# Patient Record
Sex: Female | Born: 1935 | Race: White | Hispanic: No | Smoking: Never smoker
Health system: Southern US, Community
[De-identification: ages and names within clinical notes are randomized; demographics above are authoritative.]

## PROBLEM LIST (undated history)

## (undated) DIAGNOSIS — R351 Nocturia: Secondary | ICD-10-CM

## (undated) DIAGNOSIS — M1990 Unspecified osteoarthritis, unspecified site: Secondary | ICD-10-CM

## (undated) DIAGNOSIS — T4145XA Adverse effect of unspecified anesthetic, initial encounter: Secondary | ICD-10-CM

## (undated) DIAGNOSIS — C50912 Malignant neoplasm of unspecified site of left female breast: Secondary | ICD-10-CM

## (undated) DIAGNOSIS — S40812A Abrasion of left upper arm, initial encounter: Secondary | ICD-10-CM

## (undated) DIAGNOSIS — H269 Unspecified cataract: Secondary | ICD-10-CM

## (undated) DIAGNOSIS — R296 Repeated falls: Secondary | ICD-10-CM

## (undated) DIAGNOSIS — N189 Chronic kidney disease, unspecified: Secondary | ICD-10-CM

## (undated) DIAGNOSIS — T8859XA Other complications of anesthesia, initial encounter: Secondary | ICD-10-CM

## (undated) DIAGNOSIS — E785 Hyperlipidemia, unspecified: Secondary | ICD-10-CM

## (undated) DIAGNOSIS — J302 Other seasonal allergic rhinitis: Secondary | ICD-10-CM

## (undated) DIAGNOSIS — C4490 Unspecified malignant neoplasm of skin, unspecified: Secondary | ICD-10-CM

## (undated) DIAGNOSIS — K219 Gastro-esophageal reflux disease without esophagitis: Secondary | ICD-10-CM

## (undated) DIAGNOSIS — D649 Anemia, unspecified: Secondary | ICD-10-CM

## (undated) DIAGNOSIS — F99 Mental disorder, not otherwise specified: Secondary | ICD-10-CM

## (undated) DIAGNOSIS — I10 Essential (primary) hypertension: Secondary | ICD-10-CM

## (undated) DIAGNOSIS — R112 Nausea with vomiting, unspecified: Secondary | ICD-10-CM

## (undated) DIAGNOSIS — Z9889 Other specified postprocedural states: Secondary | ICD-10-CM

## (undated) DIAGNOSIS — K7460 Unspecified cirrhosis of liver: Secondary | ICD-10-CM

## (undated) HISTORY — PX: SHOULDER SURGERY: SHX246

## (undated) HISTORY — PX: ROTATOR CUFF REPAIR: SHX139

## (undated) HISTORY — PX: SKIN BIOPSY: SHX1

## (undated) HISTORY — PX: BREAST SURGERY: SHX581

## (undated) HISTORY — DX: Malignant neoplasm of unspecified site of left female breast: C50.912

## (undated) HISTORY — PX: FRACTURE SURGERY: SHX138

## (undated) HISTORY — PX: EYE SURGERY: SHX253

## (undated) HISTORY — PX: BACK SURGERY: SHX140

## (undated) HISTORY — PX: CHOLECYSTECTOMY: SHX55

## (undated) HISTORY — DX: Hyperlipidemia, unspecified: E78.5

## (undated) HISTORY — DX: Unspecified cirrhosis of liver: K74.60

## (undated) HISTORY — PX: TUBAL LIGATION: SHX77

## (undated) HISTORY — PX: ABDOMINAL HYSTERECTOMY: SHX81

---

## 1998-09-13 ENCOUNTER — Ambulatory Visit (HOSPITAL_COMMUNITY): Admission: RE | Admit: 1998-09-13 | Discharge: 1998-09-13 | Payer: Self-pay | Admitting: Urology

## 1998-09-13 ENCOUNTER — Encounter: Payer: Self-pay | Admitting: Urology

## 1999-11-09 ENCOUNTER — Encounter: Admission: RE | Admit: 1999-11-09 | Discharge: 1999-11-09 | Payer: Self-pay | Admitting: Urology

## 1999-11-09 ENCOUNTER — Encounter: Payer: Self-pay | Admitting: Urology

## 2001-02-28 ENCOUNTER — Ambulatory Visit (HOSPITAL_COMMUNITY): Admission: RE | Admit: 2001-02-28 | Discharge: 2001-02-28 | Payer: Self-pay | Admitting: Pulmonary Disease

## 2001-03-20 ENCOUNTER — Encounter: Payer: Self-pay | Admitting: Obstetrics and Gynecology

## 2001-03-27 ENCOUNTER — Inpatient Hospital Stay (HOSPITAL_COMMUNITY): Admission: RE | Admit: 2001-03-27 | Discharge: 2001-03-29 | Payer: Self-pay | Admitting: Obstetrics and Gynecology

## 2001-03-27 ENCOUNTER — Encounter (INDEPENDENT_AMBULATORY_CARE_PROVIDER_SITE_OTHER): Payer: Self-pay | Admitting: Specialist

## 2001-05-17 ENCOUNTER — Ambulatory Visit (HOSPITAL_COMMUNITY): Admission: RE | Admit: 2001-05-17 | Discharge: 2001-05-17 | Payer: Self-pay | Admitting: Pulmonary Disease

## 2001-12-06 ENCOUNTER — Encounter: Payer: Self-pay | Admitting: Obstetrics and Gynecology

## 2001-12-06 ENCOUNTER — Ambulatory Visit (HOSPITAL_COMMUNITY): Admission: RE | Admit: 2001-12-06 | Discharge: 2001-12-06 | Payer: Self-pay | Admitting: Obstetrics and Gynecology

## 2002-12-16 ENCOUNTER — Encounter: Payer: Self-pay | Admitting: Obstetrics and Gynecology

## 2002-12-16 ENCOUNTER — Ambulatory Visit (HOSPITAL_COMMUNITY): Admission: RE | Admit: 2002-12-16 | Discharge: 2002-12-16 | Payer: Self-pay | Admitting: Obstetrics and Gynecology

## 2003-03-03 ENCOUNTER — Encounter: Admission: RE | Admit: 2003-03-03 | Discharge: 2003-03-03 | Payer: Self-pay | Admitting: Specialist

## 2003-12-21 ENCOUNTER — Ambulatory Visit (HOSPITAL_COMMUNITY): Admission: RE | Admit: 2003-12-21 | Discharge: 2003-12-21 | Payer: Self-pay | Admitting: Obstetrics and Gynecology

## 2004-12-20 ENCOUNTER — Ambulatory Visit (HOSPITAL_COMMUNITY): Admission: RE | Admit: 2004-12-20 | Discharge: 2004-12-20 | Payer: Self-pay | Admitting: Pulmonary Disease

## 2005-02-08 ENCOUNTER — Emergency Department (HOSPITAL_COMMUNITY): Admission: EM | Admit: 2005-02-08 | Discharge: 2005-02-08 | Payer: Self-pay | Admitting: Emergency Medicine

## 2005-04-06 ENCOUNTER — Inpatient Hospital Stay (HOSPITAL_COMMUNITY): Admission: RE | Admit: 2005-04-06 | Discharge: 2005-04-10 | Payer: Self-pay | Admitting: Specialist

## 2005-08-09 ENCOUNTER — Encounter (HOSPITAL_COMMUNITY): Admission: RE | Admit: 2005-08-09 | Discharge: 2005-09-08 | Payer: Self-pay | Admitting: Specialist

## 2005-09-11 ENCOUNTER — Encounter (HOSPITAL_COMMUNITY): Admission: RE | Admit: 2005-09-11 | Discharge: 2005-10-11 | Payer: Self-pay | Admitting: Specialist

## 2005-09-20 ENCOUNTER — Ambulatory Visit (HOSPITAL_COMMUNITY): Admission: RE | Admit: 2005-09-20 | Discharge: 2005-09-20 | Payer: Self-pay | Admitting: Pulmonary Disease

## 2005-12-20 ENCOUNTER — Ambulatory Visit (HOSPITAL_COMMUNITY): Admission: RE | Admit: 2005-12-20 | Discharge: 2005-12-20 | Payer: Self-pay | Admitting: Neurosurgery

## 2005-12-22 ENCOUNTER — Ambulatory Visit (HOSPITAL_COMMUNITY): Admission: RE | Admit: 2005-12-22 | Discharge: 2005-12-22 | Payer: Self-pay | Admitting: Neurosurgery

## 2005-12-26 ENCOUNTER — Ambulatory Visit (HOSPITAL_COMMUNITY): Admission: RE | Admit: 2005-12-26 | Discharge: 2005-12-26 | Payer: Self-pay | Admitting: Pulmonary Disease

## 2006-01-17 ENCOUNTER — Ambulatory Visit (HOSPITAL_COMMUNITY): Admission: RE | Admit: 2006-01-17 | Discharge: 2006-01-17 | Payer: Self-pay | Admitting: Pulmonary Disease

## 2006-02-06 ENCOUNTER — Ambulatory Visit: Payer: Self-pay | Admitting: Physical Medicine & Rehabilitation

## 2006-02-06 ENCOUNTER — Encounter
Admission: RE | Admit: 2006-02-06 | Discharge: 2006-05-07 | Payer: Self-pay | Admitting: Physical Medicine & Rehabilitation

## 2006-04-05 ENCOUNTER — Ambulatory Visit: Payer: Self-pay | Admitting: Physical Medicine & Rehabilitation

## 2006-04-05 ENCOUNTER — Encounter
Admission: RE | Admit: 2006-04-05 | Discharge: 2006-07-04 | Payer: Self-pay | Admitting: Physical Medicine & Rehabilitation

## 2006-06-05 ENCOUNTER — Ambulatory Visit: Payer: Self-pay | Admitting: Physical Medicine & Rehabilitation

## 2006-07-04 ENCOUNTER — Ambulatory Visit (HOSPITAL_COMMUNITY): Admission: RE | Admit: 2006-07-04 | Discharge: 2006-07-04 | Payer: Self-pay | Admitting: Pulmonary Disease

## 2006-08-03 ENCOUNTER — Encounter
Admission: RE | Admit: 2006-08-03 | Discharge: 2006-11-01 | Payer: Self-pay | Admitting: Physical Medicine & Rehabilitation

## 2006-08-03 ENCOUNTER — Ambulatory Visit: Payer: Self-pay | Admitting: Physical Medicine & Rehabilitation

## 2006-08-21 ENCOUNTER — Encounter: Admission: RE | Admit: 2006-08-21 | Discharge: 2006-08-21 | Payer: Self-pay | Admitting: Specialist

## 2006-11-06 ENCOUNTER — Inpatient Hospital Stay (HOSPITAL_COMMUNITY): Admission: RE | Admit: 2006-11-06 | Discharge: 2006-11-10 | Payer: Self-pay | Admitting: Specialist

## 2006-12-19 ENCOUNTER — Ambulatory Visit (HOSPITAL_COMMUNITY): Admission: RE | Admit: 2006-12-19 | Discharge: 2006-12-19 | Payer: Self-pay | Admitting: Pulmonary Disease

## 2007-07-17 ENCOUNTER — Encounter (HOSPITAL_COMMUNITY): Admission: RE | Admit: 2007-07-17 | Discharge: 2007-08-16 | Payer: Self-pay | Admitting: Specialist

## 2007-08-19 ENCOUNTER — Encounter (HOSPITAL_COMMUNITY): Admission: RE | Admit: 2007-08-19 | Discharge: 2007-09-18 | Payer: Self-pay | Admitting: Specialist

## 2008-01-02 ENCOUNTER — Ambulatory Visit (HOSPITAL_COMMUNITY): Admission: RE | Admit: 2008-01-02 | Discharge: 2008-01-02 | Payer: Self-pay | Admitting: Pulmonary Disease

## 2008-05-06 ENCOUNTER — Encounter: Admission: RE | Admit: 2008-05-06 | Discharge: 2008-05-06 | Payer: Self-pay | Admitting: Specialist

## 2008-05-28 ENCOUNTER — Ambulatory Visit (HOSPITAL_COMMUNITY): Admission: RE | Admit: 2008-05-28 | Discharge: 2008-05-29 | Payer: Self-pay | Admitting: Specialist

## 2008-07-06 ENCOUNTER — Encounter (HOSPITAL_COMMUNITY): Admission: RE | Admit: 2008-07-06 | Discharge: 2008-08-05 | Payer: Self-pay | Admitting: Specialist

## 2008-07-13 ENCOUNTER — Encounter: Admission: RE | Admit: 2008-07-13 | Discharge: 2008-07-13 | Payer: Self-pay | Admitting: Specialist

## 2008-07-28 ENCOUNTER — Inpatient Hospital Stay (HOSPITAL_COMMUNITY): Admission: RE | Admit: 2008-07-28 | Discharge: 2008-08-01 | Payer: Self-pay | Admitting: Specialist

## 2009-01-04 ENCOUNTER — Ambulatory Visit (HOSPITAL_COMMUNITY): Admission: RE | Admit: 2009-01-04 | Discharge: 2009-01-04 | Payer: Self-pay | Admitting: Pulmonary Disease

## 2009-04-29 ENCOUNTER — Ambulatory Visit (HOSPITAL_BASED_OUTPATIENT_CLINIC_OR_DEPARTMENT_OTHER): Admission: RE | Admit: 2009-04-29 | Discharge: 2009-04-29 | Payer: Self-pay | Admitting: Urology

## 2010-01-06 ENCOUNTER — Ambulatory Visit (HOSPITAL_COMMUNITY): Admission: RE | Admit: 2010-01-06 | Discharge: 2010-01-06 | Payer: Self-pay | Admitting: Pulmonary Disease

## 2010-03-19 ENCOUNTER — Encounter: Payer: Self-pay | Admitting: Neurosurgery

## 2010-03-20 ENCOUNTER — Encounter: Payer: Self-pay | Admitting: Pulmonary Disease

## 2010-03-20 ENCOUNTER — Encounter: Payer: Self-pay | Admitting: Specialist

## 2010-03-21 ENCOUNTER — Encounter: Payer: Self-pay | Admitting: Specialist

## 2010-04-26 ENCOUNTER — Other Ambulatory Visit (INDEPENDENT_AMBULATORY_CARE_PROVIDER_SITE_OTHER): Payer: Self-pay

## 2010-04-26 DIAGNOSIS — R0989 Other specified symptoms and signs involving the circulatory and respiratory systems: Secondary | ICD-10-CM

## 2010-05-22 LAB — POCT I-STAT 4, (NA,K, GLUC, HGB,HCT)
Hemoglobin: 12.9 g/dL (ref 12.0–15.0)
Sodium: 138 mEq/L (ref 135–145)

## 2010-06-06 LAB — GLUCOSE, CAPILLARY
Glucose-Capillary: 101 mg/dL — ABNORMAL HIGH (ref 70–99)
Glucose-Capillary: 106 mg/dL — ABNORMAL HIGH (ref 70–99)
Glucose-Capillary: 107 mg/dL — ABNORMAL HIGH (ref 70–99)
Glucose-Capillary: 173 mg/dL — ABNORMAL HIGH (ref 70–99)
Glucose-Capillary: 186 mg/dL — ABNORMAL HIGH (ref 70–99)
Glucose-Capillary: 247 mg/dL — ABNORMAL HIGH (ref 70–99)
Glucose-Capillary: 282 mg/dL — ABNORMAL HIGH (ref 70–99)
Glucose-Capillary: 123 mg/dL — ABNORMAL HIGH (ref 70–99)

## 2010-06-06 LAB — HEMOGLOBIN A1C: Mean Plasma Glucose: 148 mg/dL

## 2010-06-06 LAB — BASIC METABOLIC PANEL
CO2: 26 mEq/L (ref 19–32)
Calcium: 7.6 mg/dL — ABNORMAL LOW (ref 8.4–10.5)
GFR calc Af Amer: >60 mL/min (ref >60)
GFR calc non Af Amer: >60 mL/min (ref >60)
Sodium: 134 mEq/L — ABNORMAL LOW (ref 135–145)

## 2010-06-06 LAB — HEMOGLOBIN AND HEMATOCRIT, BLOOD
HCT: 26.6 % — ABNORMAL LOW (ref 36.0–46.0)
HCT: 28.9 % — ABNORMAL LOW (ref 36.0–46.0)
Hemoglobin: 9.3 g/dL — ABNORMAL LOW (ref 12.0–15.0)

## 2010-06-06 LAB — POCT I-STAT 4, (NA,K, GLUC, HGB,HCT)
Glucose, Bld: 131 mg/dL — ABNORMAL HIGH (ref 70–99)
HCT: 28 % — ABNORMAL LOW (ref 36.0–46.0)
Potassium: 3.9 mEq/L (ref 3.5–5.1)

## 2010-06-07 LAB — URINALYSIS, ROUTINE W REFLEX MICROSCOPIC
Hgb urine dipstick: NEGATIVE
Ketones, ur: NEGATIVE mg/dL
Nitrite: NEGATIVE
Protein, ur: 30 mg/dL — AB
Urobilinogen, UA: 1 mg/dL (ref 0.0–1.0)

## 2010-06-07 LAB — COMPREHENSIVE METABOLIC PANEL
ALT: 25 U/L (ref 0–35)
AST: 35 U/L (ref 0–37)
CO2: 27 mEq/L (ref 19–32)
Calcium: 9.4 mg/dL (ref 8.4–10.5)
Chloride: 106 mEq/L (ref 96–112)
GFR calc Af Amer: >60 mL/min (ref >60)
GFR calc non Af Amer: >60 mL/min (ref >60)
Glucose, Bld: 109 mg/dL — ABNORMAL HIGH (ref 70–99)
Sodium: 140 mEq/L (ref 135–145)
Total Bilirubin: 0.5 mg/dL (ref 0.3–1.2)

## 2010-06-07 LAB — DIFFERENTIAL
Basophils Absolute: 0 10*3/uL (ref 0.0–0.1)
Basophils Relative: 0 % (ref 0–1)
Eosinophils Absolute: 0.2 10*3/uL (ref 0.0–0.7)
Eosinophils Relative: 3 % (ref 0–5)
Neutrophils Relative %: 62 % (ref 43–77)

## 2010-06-07 LAB — TYPE AND SCREEN
ABO/RH(D): O NEG
Antibody Screen: NEGATIVE

## 2010-06-07 LAB — PROTIME-INR
INR: 1 (ref 0.00–1.49)
Prothrombin Time: 13.2 seconds (ref 11.6–15.2)

## 2010-06-07 LAB — CBC
Hemoglobin: 12.4 g/dL (ref 12.0–15.0)
MCHC: 32.7 g/dL (ref 30.0–36.0)
MCV: 91.2 fL (ref 78.0–100.0)
RBC: 4.18 MIL/uL (ref 3.87–5.11)
WBC: 7.4 10*3/uL (ref 4.0–10.5)

## 2010-06-07 LAB — URINE MICROSCOPIC-ADD ON

## 2010-06-08 LAB — GLUCOSE, CAPILLARY
Glucose-Capillary: 115 mg/dL — ABNORMAL HIGH (ref 70–99)
Glucose-Capillary: 141 mg/dL — ABNORMAL HIGH (ref 70–99)
Glucose-Capillary: 182 mg/dL — ABNORMAL HIGH (ref 70–99)
Glucose-Capillary: 93 mg/dL (ref 70–99)

## 2010-06-09 LAB — URINE MICROSCOPIC-ADD ON

## 2010-06-09 LAB — COMPREHENSIVE METABOLIC PANEL
ALT: 31 U/L (ref 0–35)
Alkaline Phosphatase: 81 U/L (ref 39–117)
BUN: 22 mg/dL (ref 6–23)
CO2: 27 mEq/L (ref 19–32)
GFR calc non Af Amer: >60 mL/min (ref >60)
Glucose, Bld: 103 mg/dL — ABNORMAL HIGH (ref 70–99)
Potassium: 4.8 mEq/L (ref 3.5–5.1)
Sodium: 137 mEq/L (ref 135–145)

## 2010-06-09 LAB — PROTIME-INR
INR: 1 (ref 0.00–1.49)
Prothrombin Time: 13.6 seconds (ref 11.6–15.2)

## 2010-06-09 LAB — CBC
HCT: 38.8 % (ref 36.0–46.0)
Hemoglobin: 13.1 g/dL (ref 12.0–15.0)
MCHC: 33.7 g/dL (ref 30.0–36.0)
RBC: 4.16 MIL/uL (ref 3.87–5.11)

## 2010-06-09 LAB — URINALYSIS, ROUTINE W REFLEX MICROSCOPIC
Hgb urine dipstick: NEGATIVE
Ketones, ur: 15 mg/dL — AB
Protein, ur: 30 mg/dL — AB
Urobilinogen, UA: 1 mg/dL (ref 0.0–1.0)

## 2010-06-09 LAB — DIFFERENTIAL
Basophils Absolute: 0.1 10*3/uL (ref 0.0–0.1)
Basophils Relative: 1 % (ref 0–1)
Eosinophils Absolute: 0.3 10*3/uL (ref 0.0–0.7)
Neutrophils Relative %: 58 % (ref 43–77)

## 2010-06-09 LAB — HEMOGLOBIN A1C: Hgb A1c MFr Bld: 8.4 % — ABNORMAL HIGH (ref 4.6–6.1)

## 2010-07-12 NOTE — Op Note (Signed)
NAMEANNER, PHAUP              ACCOUNT NO.:  [id number]   MEDICAL RECORD NO.:  [id number]          PATIENT TYPE:  OIB   LOCATION:  5013                         FACILITY:  [facility]   PHYSICIAN:  Jessy Oto, M.D.   DATE OF BIRTH:  1933/09/15   DATE OF PROCEDURE:  05/28/2008  DATE OF DISCHARGE:                               OPERATIVE REPORT   PREOPERATIVE DIAGNOSIS:  Left L2-3 herniated nucleus pulposus.   POSTOPERATIVE DIAGNOSES:  Left L2-3 herniated nucleus pulposus with left  L2-3 small dural tear, 2-suture repair.   PROCEDURE:  Left L2-3 microdiskectomy.  Repair of dural tear, 2 sutures  with Tisseel.  A 4-0 Nurolon was used.   SURGEON:  Jessy Oto, MD   ASSISTANT:  [employer]. Mercie Eon.   ANESTHESIA:  General via orotracheal intubation, Dr. Kate Sable.  Local infiltration with Marcaine 0.5% with 1:200,000 epinephrine 5 mL.   FINDINGS:  HNP, L2-3 with left L2 and L3 nerve root impression.  Left  dural tear occurred with use of a Penfield 4 over the medial aspect of  the facet at the L2-3 level causing small durotomy.  This is repaired  with 4-0 Nurolon suture.   ESTIMATED BLOOD LOSS:  100 mL.   COMPLICATIONS:  Dural tear, a small repair with Tisseel and two 4-0  Nurolon sutures.   The patient returned to the PACU in good condition.   HISTORY OF PRESENT ILLNESS:  The patient is a 77 year old female who has  undergone previous 2-level decompression for recurring foraminal  stenosis and spondylolisthesis.  Fusion was performed at both, L3-4 and  L4-5.  She has done well following surgical treatment over the last 3  months, though she has developed worsening severe pain radiating into  the left groin and into the left thigh and knee.  She is experiencing  weakness with walking stairs, tendency for the knee to give away.  She  underwent preoperative evaluation, which demonstrated left L2-3 HNP with  impingement above the left L2 and L3 nerve roots.  She underwent  an  attempted epidural steroid, which was unsuccessful at relieving her  pain.  Her medical exam shows weakness in the extension and left hip  flexion.  Left quadriceps reflex, knee jerk is diminished, zero when  compared with 2+ on the right.  She was brought to the operating room to  undergo left L2-3 microdiskectomy.   DESCRIPTION OF PROCEDURE:  After adequate general anesthesia, the  patient had DuraPrep following a turn to prone position using a Wilson  frame and reverse Skytron table.  Standard prep was with DuraPrep  solution and she was draped in the usual manner, iodine Vi-drape was  used.  Time-out was carried out, marking of the left side of her lumbar  back carried out in the preop holding area and standard intraoperative  time-out protocol was carried out identifying the patient, the side of  expected procedure and any further considerations.  The patient's  incision was made at the palpable spinous process of L2, the last  remaining inferior spinous process.  Incision approximately an inch to  inch and a half in length through the skin and subcutaneous layers in  the midline down to the spinous process of L2.  Electrocautery used to  divide the paralumbar muscles off the lateral aspect of the spinous  process of L2 on the left side down to the lamina and blunt dissection  used carefully to free up lumbodorsal fascia and paralumbar muscles to  the left side at the L2-3 level.  Leksell rongeur was used to remove the  small portion of the inferior aspect of the length of the spinous  process of L2 and inferior aspect of lamina of L2.  Loop magnification  and headlamp were used during this portion of the procedure.  Kerrisons  were then able to be introduced inferior to the lamina of L2 on the left  and the lamina carefully resected inferiorly over about 5-6 mm.  Medial  facet of the L2-3 level was similarly osteotomized using Kerrisons and  drilled using [employer].  High-speed  bur was used to further thin the  medially aspect of the facet.  Penfield 4 was then used to carefully  free scar tissue off the medial aspect of the facet at L2-3 level.  In  the process of doing so, cerebral spinal fluid was encountered.  Carefully, the dimensions of the laminotomy on the left side was opened  further using 3 and 4-mm Kerrisons.  The ligamentum flavum of median  aspect of the L2-3 facet was carefully resected back and the L3 nerve  identified as well as the thecal sac at the L2-3 level.  A small  longitudinal rent in the thecal sac found to be present with CSF  leakage.  The entire length of this is approximately 3 mm.  Carefully,  hemostasis was obtained using Gelfoam and multiple cottonoids.  A single  4-0 Nurolon suture was placed after first bringing in the microscope and  determining the extend of the small dural tear.  Under the operating  room microscope, then the 4-0 Nurolon suture was passed through the  edges of the dura and then sutured, tied using 4-0 Nurolon suture.  Two  passes were required to close the dural tear.  With this Valsalva was  performed and there did not appeared to be any further leakage or  drainage.  The lateral aspect of the thecal sac and the L3 nerve root  were then carefully retracted medially.  The L2-3 disk encountered,  found to be protruding out posteriorly to the left side.  A 15 blade  scalpel used to incise the disk space longitudinally and a nerve hook  used to free up subligamentous disk material found to be present.  Straight and up-biting pituitaries then used to debride the disk  material from the degenerative disk area.  Disk also excised into the  foramen in the subligamentous fashion, several large fragments found  exiting L2 neuroforamen causing L2 neuroforaminal stenosis.  Hockey  stick neuro probe was then passed out the L2 foramen and used to  carefully probe and compress the disk,  ensure no loose fragments  present  that could be extruded.  The hockey stick neuro probe was then  passed out at the L3 neuroforamen, it was found to be tight underlying  the L3 nerve root and a nerve hook was then passed in the subligamentous  fashion beneath the L3 nerve root and about 2 or 3 further free  fragments were found beneath the L3 nerve root causing further  compression here.  Once these were removed, resected using  micropituitary rongeurs, the L3 nerve root found to be free.  There is a  small opening at the very inferior aspect of the previous dural tear or  dural rent, and this was repaired with 1 further suture using 4-0  Nurolon.  Valsalva again performed and there was no leakage noted.  Tisseel was then carefully mixed.  Hemostasis obtained using bone wax  applied to the bleeding bone surfaces and the prepared Tisseel was then  carefully painted over the area of dural repair allowing for a nice  fibrin Tisseel on top of an already sutured dural repair.  With this, an  additional Tisseel was placed over the vein areas where bleeding was  present.  Bipolar electrocautery was used to control bleeders and then  Tisseel applied.  Additional Tisseel was placed over the posterior  aspect of the laminotomy region on the left side at L2-3.  The patient  did have intraoperative radiograph with a Penfield 4 over the posterior  aspect of the upper pedicle of this patient's construct, it was the L3  pedicle and the patient's microdiskectomy was carried out superior to  this pedicle in the expected L2-3 position.  Following this, a closure  was carried out approximating the lumbodorsal fascia in the midline with  interrupted 0 Vicryl sutures with UR6 needle.  Deep subcu layers  reapproximated with interrupted 2-0 Vicryl sutures.  Skin closed with a  running subcu stitch of 4-0 Vicryl.  The patient had Dermabond applied.  Coverlet dressing.  The patient was then reactivated following return to  supine position,  reactivated and returned to recovery room in  satisfactory condition.  All instrument and sponge counts were correct.   ADDENDUM   To indicate patient did have complications of dural tear on the left  side.  No neural elements noted within the dural tear area.  Repair  carried out using two 4-0 Nurolon sutures and Tisseel.      Jessy Oto, M.D.  Electronically Signed     JEN/MEDQ  D:  05/28/2008  T:  05/29/2008  Job:  [id number]

## 2010-07-12 NOTE — Op Note (Signed)
NAMEMALAE, [name]              ACCOUNT NO.:  [id number]   MEDICAL RECORD NO.:  [id number]          PATIENT TYPE:  INP   LOCATION:  5032                         FACILITY:  [facility]   PHYSICIAN:  Jessy Oto, M.D.   DATE OF BIRTH:  [birth year]   DATE OF PROCEDURE:  11/06/2006  DATE OF DISCHARGE:                               OPERATIVE REPORT   PREOPERATIVE DIAGNOSIS:  Severe right L3 and L4 foraminal stenosis, left  L3-4 lateral recess stenosis.  Collapsing degenerative scoliosis pattern  involving the mid lumbar segments with recurring foraminal stenosis.  Status post previous central laminectomy L3-4 and L4-5.   POSTOPERATIVE DIAGNOSIS:  Severe right L3 and L4 foraminal stenosis,  left L3-4 lateral recess stenosis.  Collapsing degenerative scoliosis  pattern involving the mid lumbar segments with recurring foraminal  stenosis.  Status post previous central laminectomy L3-4 and L4-5.   PROCEDURE:  Left L2-3 lateral recess decompression, right-sided L3-4 and  L4-5 facetectomies with right L3-4 and L4-5 transforaminal lumbar  interbody fusion using SCIENT'XPEEK horseshoe cages with local bone  graft.  8-mm cage used at the L3-4 level and 11-mm cage used at the L4-5  level lordotic.  Posterior lateral fusion L3-4 and L4-5 utilizing a  combination of local bone graft and Vitoss with right L3 bone marrow  aspirate.  Internal fixation from L3 to L5 two segments using DePuy  Monarch pedicle screws and rods.   SURGEON:  Dr. Basil Dess.   ASSISTANT:  Phillips Hay, [facility]   ANESTHESIA:  General via oral tracheal intubation, Dr. Sherren Kerns.   ESTIMATED BLOOD LOSS:  500 mL.   DRAINS:  Foley catheter to straight drain, Hemovac times one.   BRIEF CLINICAL HISTORY:  As the patient is 77 year old female who has  undergone previous lumbar decompression surgery 4 years ago with central  laminectomies at L3-4 and L4-5 with good relief of back and leg pain.  She has since undergone  redevelopment of increasing neurogenic  claudication particularly into the right leg at L3 and L4 distribution  as well as a left-sided buttock and thigh pain.  She had undergone  extensive conservative management including epidural steroids that only  temporized her pain but did provide relief.  MRI scan has been performed  which demonstrates significant foraminal entrapment affecting the right  L3 and L4 nerve roots left-sided L2-3 lateral recess stenosis affecting  the L2 and L3 nerve root.   INTRAOPERATIVE FINDINGS:  The patient was found to have severe foraminal  entrapment affecting the right L3 and L4 nerve roots as they exited out  neural foramen.  She also had L5 nerve root impingement secondary to the  lateral recess narrowing and bone spur impinging on the proximal portion  of the L5 nerve root.  Lateral recess stenosis left L2-3, right-sided  superior L3 lateral recess stenosis involving the L3 nerve root prior to  its entry into the right L3 neural foramen.  Severe degenerative disk  narrowing L3-4.   DESCRIPTION OF PROCEDURE:  After adequate general anesthesia the patient  was transferred to the operating room table following placement of Foley  catheter.  She is placed onto the [facility] spine table.  All pressure  points were well-padded, TED hose with PAS stockings were used.  Standard preoperative antibiotics of Ancef.  The patient underwent  sterile prep with DuraPrep solution from the mid dorsal spine to the mid  sacral level was draped in the usual manner, iodine Vi-drape was used.  Midline incision approximately 4-1/2 to 5 inches in length ellipsing the  old incision scar at the L3-4 level.  Continuing down to the L5 level  inferiorly and superiorly to the L2 and L1 levels the spinous processes  through skin and subcutaneous layers ellipsing the old incision scars  noted down to the lumbodorsal fascia.  Fascia was incised over both  sides of the expected spinous  process of L2, L1 and continued inferiorly  to the spinous process of L5 inferiorly.  Exposure obtained down to the  base of the spinous process of L5 and of L2 and Cobbs used to carefully  elevate paralumbar muscles incising the midline soft tissues out  laterally to the lateral aspects of previous laminotomy site and the  facet joints at L3-4, L4-5.  This was continued up to the L2-3 level  where the facet capsules were preserved.  Exposure was carried out  laterally at the L2-3 level to the transverse process of L3 at the L2-3  level over the lateral aspects of the facet at the L3-4 level exposing  the L4 transverse process.  Similarly on the left side and then at L5  out over the L4-5 facet exposing the transverse process of L5  bilaterally.  These areas were packed with sponges.  Left-sided lateral  recess decompression was carried out removing a small portion of the  inferior aspect of the spinous process of L2 after first localizing with  lateral C-arm fluoro and identifying the spinous process above as L2 and  the lower spinous process as L5.  These were marked for continued  identification.  At the L2 level the inferior half of the spinous  process was resected using Leksell rongeur down to the base of the  spinous process.  Soft tissues elevated here and 3 mm Kerrison entered  into a split in the posterior aspect of the ligamentum flavum just  beneath the posterior beneath the ventral aspect of the inferior portion  of the spinous process.  The ligamentum flavum was then able to be old  resected along the left side and then resected from the ventral surface  of the inferior aspect and lamina of L2 and over the medial aspect of  the L2-3 facet decompressing the lateral recess and decompressing the L3  nerve root prior to its exit out the left L3 neural foramen.  Following  this then a hockey stick neural probe could be passed out the L3 neural  foramen and L2 neural foramen  demonstrating their patency and no further  nerve compression.  Attention was then turned to the right side after  placing Gelfoam on the left at the L2-3 level.  On the right side then,  facetectomies were carried out at L4-5 and L3-4 on the right side first  using a curette to define the borders of the previous decompression at  L3-4 and L4-5 finding the medial aspect of previous laminectomy at the  upper aspect of L3.  Osteotomes were then used to resect the inferior  articular process of L4 and L3 on the left side.  With resection of  these large  inferior articular process then plane was developed to the  medial aspect of superior articular process of L5 and of L4.  Kerrisons  were then used to remove a small portion of bone from medial aspect of  the facet articular process at L5 and L4 on the right and osteotome was  used to further resect the medial articular process back to the medial  portion of the pedicle at L5 and at L4.  This was continued superiorly  to the L3 pedicle and decompression was carried out then from superior  to inferior on the right side at L2-3 decompressing the right L2 lateral  recess and continuing out over the right L3 nerve root performing a  foraminotomy over the right L3 nerve root resecting the pars overlying  the nerve root and deflected portions ligament flavum.  This was done  using loupe magnification headlamp.  Hockey-stick neuro probe passed  over the nerve root L3  nerve so that reflected flavum could be resected  without difficulty and opening planes between previous reflected  ligamentum flavum and thecal sac laterally.  This was then continued  inferiorly over the superior portion of the superior articular process  of L4 was resected again using osteotomes and 3 mm Kerrisons.  Bleeders  controlled using bipolar electrocautery, thrombin-soaked Gelfoam.  Continuing further inferiorly then the lateral recess at L3-4 was then  decompressed.  The L4  nerve root further decompressed and foraminotomy  performed over the L3-L4 nerve root as it exited out the L4-5 neural  foramen on the right side.  Medial portion of the superior articular  process of L5 was resected using osteotomes as was the superior portion  of the superior articular process using osteotomes Kerrisons.  Reflected  portion of the ligament flavum was resected off the L4 nerve root  further decompressing this side.  Bipolar electrocautery used to control  veins this side.  Gelfoam thrombin-soaked placed.  The right L5 nerve  root was further decompressed and foraminotomy performed on this side.  Small amount of bone graft over the medial aspect of the pedicle of L5,  medial portion of the facet at L4-5 on the right causing impingement on  the thecal sac and lateral recess regarding its removal using 2-3 mm  Kerrisons.  The thecal sac at the L4-5 level was then retracted medially  as was the L4 nerve root retracted superiorly and disk space identified  at L4-5.  Electrocautery and bipolar was then used to cauterize small  epidural veins.  Gelfoam was then placed to preserve plane to the  posterior lateral aspect of the disk the right side at L4-5.  Similarly  this was done with the right L3-4 level.  Intraoperative C-arm fluoro  into brought into field and awl used to make an entry point the right  side at the L3 lateral aspect of the pedicle at the L2-3 level over the  L3 transverse process intersection with the lateral aspect of the  pedicle of L3.  Awl was used for initial entry point.  Then the pedicle  probe used to probe pedicle on the right at L3, demonstrating its  patency with the ball-tipped probe.  A blunt tip bone marrow aspirate  kit was then used to aspirate bone marrow from the right L3 pedicle  using blunt tip trocar to insert to almost 40 mm and withdrawing some 10  mL of bone marrow.  This was then into the Vitoss matrix.  10 mL Vitoss  was used.   Returning to the L3 level then tapping was performed using a  5.5 tap, ball-tipped probe used to ensure patency of the pedicle on the  right at L3 demonstrated no broaching cortex.  Depth measuring 45 mm  6.25 x 45 mm screw was chosen.  Decortication was carried out of the  transverse process of L3 on the right and the screw was then placed on  the right at L3.  Awl placed at the L4 level.  Intraoperative radiograph  demonstrated the screw at L3 to be excellent position alignment.  The  awl placed within the area of the intersection of the transverse process  of L4 with the lateral aspect of the pedicle L4 on the right.  Pedicle  finder was then used to probe the pedicle channel, a ball-tipped probe  used to ensure patency of the pedicle channel and ensure no broaching  cortex.  Tapping then performed using a 5.5 tap and 6.2 x 45 mm screw  was then placed on the right at the L4 level.  Decortication of the  transverse process of L was carried out.  Note that following tapping  ball-tipped probe was again used probe the channel to ensure no  broaching of cortex.  With this screw then placed each of the fasteners  of the screw was then carefully loosened using the head breakers.  Decortication of transverse process of L3 and L4 and then Vitoss cut  into segments of matchstick were then placed on the right side at L3 and  L4.  Finally the right L5 level then an awl was used to make an entry  point in the lateral aspect of pedicle at L5 using correct degree of  convergence as well as lordosis.  This was placed at the intersection of  the transverse process of L5 lateral pedicle of L5.  Pedicle probe then  used probe pedicle channel.  Ball-tipped probe used to ensure patency  and demonstrated no broaching cortex at the L5 on the right.  Tapping  performed using 6.25 tap.  Then after decortication transverse process  and application of further Vitoss then a 6.25 x 45 mm screw was placed  on the  right at L5.  Proceeding to the left side similarly in similar  fashion the left L3 pedicle screw was placed using again an awl for  initial entry point with C-arm to demonstrate correct position  alignment.  Then a pedicle finder to probe the pedicle the L3 level.  Tapping with a 5.5 tap assessing patency of the inner pedicle opening  and demonstrating no broaching cortex.  Tapping with a 5.5 tap and a 45  mL x 6.25 screw placed on the left at L3.  Decortication of transverse  process and application of Vitoss.  Then on the left at the L4 level,  similarly placing an awl using C-arm fluoro then pedicle probe, ball-  tipped probe to ensure no broaching cortex of the pedicle itself and  then tapping with a 5.5 tap using 45 mm x 6.25 screw at the L4 level on  the left side.  Then at L5 on the left again with exposure obtained, awl  used to make an initial entry point the correct degree of lordosis and  emergence then a pedicle finder then used probe the pedicle channel.  Ball-tipped probe used to ensure patency of the pedicle.   This completed then tapping was performed using a 5.5 tap, 6.25 x 45 mm  screw placed on left side at L5.  This completed and Vitoss placed over  the decorticated portion of the transverse process of left L3, L4 and  L5.  Each of these screws were then tested for their soft tissue  resistance.  On the right soft tissue resistance measured 43-41 over the  three screws.  On the left side at L3 41, and the left L4 18 left L5 18.  Rechecking the screws on the left side at L5 and L4 C-arm fluoro  demonstrated the screws in excellent position alignment with no sign of  lateral placement.  Screws appeared to be well seated within the  pedicles of each segment.  It was felt that likely with the intention  initial entry point, initial placement of the openings into the pedicle,  broach of cortex of the lateral side of the pedicle may have occurred  and may have caused  decreased soft tissue resistance.  The screws appear  to be well seated no signs of  medial placement.  This completed then  attention was turned to the performance of TLIF left L4-5 level.  Thecal  sac was retracted medially and the L4 nerve root superiorly.  15 blade  scalpel used to incise the disk on the right side at L4-5.  Pituitaries  used to excise disk.  This was then debrided of disk material using curettage.  Straight, upbiting to the right, upbiting left curettes.  Dilatation of the disk space carried out and this was carried out to 11  mm.  A osteotomes chisels were then used to remove endplate material and  provide for an opening through the right side of the posterior aspect  the disk space.  Curettage and further debridement of the disk space  endplates cartilage end plate was carried out down to bleeding bone  surfaces.  Irrigation was performed.  Sounding was then carried out  using the trials and a trial 11 was chosen and a lordotic cage.  The  intervertebral disk space appeared to be well decompressed and the disk  material and cartilaginous end plate material.  It was bent further  debrided of any extraneous disk material.  Bone graft was obtained from  local harvesting from facets and spinous process was then placed within  the intervertebral disk space and packed into place with a number 9  trial.  A 11 mm PEEK Synthes SCIENT'Xcage was then packed with local  bone graft.  This was then placed within the intervertebral disk space  impacted into place and placed into a transverse position.  On lateral  position we had excellent position alignment.  Bleeders controlled using  electrocautery thrombin-soaked Gelfoam, cottonoid.  Attention turned to  the right L3-4 level where again the thecal sac was retracted medially  as was the L3 nerve root superiorly using D'Errico retractors.  15 blade  scalpel used to incise disk on the right.  Pituitary used debride this  material  here.  Disk however, was severely degenerated with very little  if any residual disk material present.  Pituitaries were used debride  this area.  Dilatation of the disk space carried out, an 8 mm dilator  was all that could be placed after the 6 mm dilator.  This provided  excellent distraction of the disk space with quite a bit of friction  with entry placement of the sounder and dilator.  Disk space was then  further debrided of endplate material using ring curettes as well as  curettage.  This carried out then, sounding was carried out with an 8 mm  sounder, 8 mm trial, this provided excellent fit.  No sign of ability to  place a larger cage.  With this an 8 mm SCIENT'XPEEK horseshoe type cage  was then filled with local bone graft.  Additional local bone graft was  placed within the intervertebral disk space at L3-4.  Cage was then  placed within the disk space and packed in place rotated to a lateral  alignment transverse alignment.  This was observed on C-arm fluoro to be  in excellent position alignment.  Note that a parallel cage was used at  this level.  This completed TLIF portion of the case.  Bleeders  controlled using bipolar electrocautery.  Care was taken to carefully  evaluate the laminotomy region of both L3-4, L4-5 ensure no bony  particulate matter posterior to the placement of the cages at both  levels.  L3 and L4 nerve roots exiting without nerve compression.  65 mm  prebent rods were then placed within the fasteners extending from L3-L4  on the left and right side and the fastener caps applied.  The fasteners  were attached to rods 80 feet pounds at the L3 level both sides then  compression obtained between the right L3 fastener and L4 fastener.  The  fastener at L4 was then tightened to 80 foot-pounds.  Similarly this was  carried out on the left. Then on the left side at L4-5 compression  obtained between the fastener of L4 and L5 and the L5 fastener then   tightened to 80 foot-pounds.  On the right side then this was carried  out with compression between the L4 and L5 fasteners and the L5 fastener  cap tightened to 80 foot-pounds.  This completed the instrumentation  phase case and TLIF decompression.  Intraoperative C-arm fluoro both  lateral and AP planes demonstrated pedicle screws in excellent position  alignment as well as the rods.  TLIF in excellent position alignment.  Again using a hockey-stick neuro probe, neural foramen of L3, L4, L2, L5  could be easily probed on the right side without evidence of further  nerve compression.  Bleeders had been controlled using bipolar  electrocautery.  Excess thrombin-soaked Gelfoam was then removed from  the incision.  Any residual local bone graft was then applied over the  posterior lateral region on left and right side.  Bleeders were again  controlled using electrocautery and after irrigation then medium Hemovac  drain was placed depth incision.  The paralumbar muscles approximated  midline laminotomy region with interrupted #1 Vicryl sutures.  The  lumbodorsal fascia reattached to the L1-2, L5-S1 interspinous ligaments  using #1 Vicryl sutures.  The patient's lumbodorsal fascia  reapproximated to itself at midline with interrupted #1 Vicryl sutures.  Deep subcu layers approximated with interrupted 0 then interrupted 2-0  Vicryl sutures used to close the more superficial layers.  Skin was then  closed with running subcu stitch of 4-0 Vicryl following subcutaneous  approximation with interrupted 2-0 Vicryl sutures.  Tincture of benzoin  Steri-Strips applied, 4x4s, ABD pads fixed to skin with Hypafix tape.  Hemovac was charged.  The patient returned to the supine position,  reactivated, extubated and returned recovery room in satisfactory  condition.  All instrument and sponge counts were correct.   COMPLICATIONS:  None.      Jessy Oto, M.D.  Electronically Signed    JEN/MEDQ  D:   11/06/2006  T:  11/07/2006  Job:  [id number]

## 2010-07-12 NOTE — Op Note (Signed)
[name], [name]              ACCOUNT NO.:  [id number]   MEDICAL RECORD NO.:  [id number]          PATIENT TYPE:  INP   LOCATION:  5003                         FACILITY:  [facility]   PHYSICIAN:  Jessy Oto, M.D.   DATE OF BIRTH:  Jul 14, 1933   DATE OF PROCEDURE:  07/28/2008  DATE OF DISCHARGE:                               OPERATIVE REPORT   PREOPERATIVE DIAGNOSES:  1. Recurrent herniated nucleus pulposus, left L2-3, extruded fragment      over the posterior aspect of vertebral body of L3.  2. Degenerative disk disease, L2-3 above L3-L5 fusion.   POSTOPERATIVE DIAGNOSES:  1. Recurrent herniated nucleus pulposus, left L2-3, extruded fragment      over the posterior aspect of vertebral body of L3.  2. Degenerative disk disease, L2-3 above L3-L5 fusion.   PROCEDURE:  Left L2-3 diskectomy redo with left L2-3 transforaminal  lumbar interbody fusion using a 9-mm DePuy lordotic cage and local bone  graft.  Extension of posterior instrumentation to the L2-3 level from  the L3 to L5 fusion with exchange of rods and caps from L3 to L5 and  extension to the L2 level.  Posterolateral fusion L2-3 with Vitoss.  Right iliac crest bone marrow aspiration.   SURGEON:  Jessy Oto, MD   ASSISTANT:  Epimenio Foot, PA-C   ANESTHESIA:  General via orotracheal intubation, Dr. Linna Caprice.  Also,  infiltrated locally with Marcaine 0.5% with 1:200,000 epinephrine, 20  mL.   FINDINGS:  Recurrent HNP, left L2-3, severely degenerative disk L2-3,  left L3 nerve root compression, left L2 neural foraminal stenosis,  degenerative disk disease, L2-3.   ESTIMATED BLOOD LOSS:  400 mL.   COMPLICATIONS:  None.   Foley to straight drain and Hemovac x1, left lower lumbar drains.   The patient returned to the PACU unit in good condition.   HISTORY OF PRESENT ILLNESS:  A 77 year old female who is status post L3  to L5 fusion several years ago, went on to heal this well for multiple  level foraminal  entrapment.  She developed acute sudden pain in her  back, radiation to left leg, underwent evaluation and was found to have  disk protrusion of the L2-3 level above and L3 to L5 fusion, underwent  epidural steroids, conservative management; however, failed to improve  in terms of the pain, was taken to the operating room.  Nearly 2 months  ago, underwent decompression at L2-3 level with excision of HNP,  complicated by dural tear, which underwent repair.  Postoperatively, the  patient had a period of decreased discomfort and pains, although  worsening pain and discomfort over the next several weeks.  Followup  studies demonstrated recurrent disk herniation, attempted epidural  steroid was totally unsuccessful.  The patient was brought to the  operating room to undergo redo microdiskectomy with extension of her  fusion from L3 to L5 to the L2-3 level with TLIF on the left side and  posterolateral fusion.   INTRAOPERATIVE FINDINGS:  As above.   DESCRIPTION OF PROCEDURE:  After adequate general anesthesia, the  patient had Foley catheter placed, standard  preoperative antibiotics.  She had marking at left L2-3 level in preop holding area.  She was  turned to the prone position.  All pressure points were well padded.  PAS hose for decrease in tendency for deep venous thrombosis.  Standard  prep with DuraPrep solution for lower dorsal spine to the mid sacral  level, draped in the usual manner, iodine Vi-Drape was used.  Standard  intraoperative time-out protocol identifying the patient, site of  procedure, expected concerns of estimated blood loss and site of the  procedure.  Following this, the patient had infiltration of the skin,  subcutaneous layers, a midline incision made from L1 to S1 midline  through the skin and subcutaneous layers directly down to the spinous  process of L1 and residual spinous process of L2 in the midline and the  spinous process of S1 inferiorly.  The Cobbs were  then used to elevate  the paralumbar muscles bilaterally over the residual posterior elements  of L2 bilaterally.  Exposing this, determining the depth of further  incision, incision then made midline to approximating this depth down to  the level of S1.  Next, the incision was carried out laterally exposing  the hardware bilaterally, previous pedicles, screws, and rods at the L2-  3 level extending down to the L5 level bilaterally.  This was then used  to further judge the incision depth along the way as the exposure was  obtained out to the posterior elements and the posterior instrumentation  bilaterally using Cobbs to elevate paralumbar muscles and division with  electrocautery.  The shorter of the two Viper retractor was inserted for  retraction purposes.   Leksell rongeur then used to carefully remove the residual portion of  the L2 spinous process in the midline.  Then, removing scar tissue over  the left side of the lamina, residual L2 and the facet of L2-3 level on  the left side as well as right side.   The posterior instrumentation extending from L3 to L5 was carefully  debrided as well using curettage and Leksell rongeurs.  Each of the caps  were then carefully loosened using a screw driver in the appropriate  size, cap remover was used to remove each of the 6 caps, rods were then  removed and each of the individual fasteners and screws were carefully  debrided off soft tissue within the cap top using a combination of Beyer  rongeurs as well as pituitaries.  At the upper end of the fusion site,  the transverse process of L3 was identified bilaterally removing soft  tissue lateral to the pedicle aspect at each segment and carefully using  high-speed bur to remove tissue at this level following curettage to  identify the residual transverse process.  High-speed bur was then used  to decorticate these transverse processes lateral to the pedicle  fastener at the L3 level  bilaterally.  The transverse process of L2 was  identified, the L1-2 level bilaterally, lateral to the facet here.  The  facet capsule was preserved bilaterally at L1-2.  These were denuded of  any. soft tissue attachment in both sides.  A 3-mm Kerrison was then  used to resect left hemi-lamina of L2 identifying the thecal sac  superiorly and performing a lateral recess decompression over the left  side.  Osteotome was then used to resect inferior articular process of  L2, the L2-3 facet.  This allowed for identification of superior  articular process at L3.  A 3-mm Kerrison  was then used to perform  lateral recess decompression resecting medial aspect of the superior  articular process of L3 as well as the superior portion of this to the  level of the superior aspect of pedicle of L3.  Decompression of the L3  nerve root was carried out to its neuroforamen, the foraminotomy over  the L3 nerve root using 3-mm Kerrison.  Loop magnification and headlamp  was used during this portion of procedure.  Intraoperative C-arm was  then brought into the field identification of the level with a Penfield  4 in posterior aspect of the disk at the L2-3 level identifying this as  correct level.  This then Gaspar Garbe 4 was used carefully freed in lateral  aspect of the thecal sac, one on the medial aspect of the L3 pedicle and  carefully freed this up mobilizing medially where medial disk material  was found to be present within the entry points of the L3 neuroforamen  at the medial aspect of the pedicel at the L3 level.  Disk was excised  using micro pituitary rongeurs.  Large amount of disk material was  removed decompressing the L3 nerve root until no further disk material  could be removed from the subligamentous area beneath the L3 nerve root  in the axillary attachment of the L3 nerve root.  Following its  resection, the hockey stick neuro probe could be passed out at the L3  neuroforamen.  The L2-3  disk space appeared to have retrolisthesis of L2  and L3, disk material was found posterior to disk space here as well,  mobilization of the L2 nerve root and the thecal sac within the axillary  area of the L2 nerve root attachment of the thecal sac was carried out  using Penfield 4.  Bleeders controlled using Bipolar electrocautery and  disk annular material was then excised using a 15-blade scalpel  retracting the thecal sac to nerve root carefully.   Then the disk could be entered and pituitary rongeurs were used to  debride this degenerative disk material.  Small amount of disk material  remained.  This appeared to be severely degenerated in appearance.  Dilator was then introduced into the disk space, the disk space was  dilated to 8 and then 9 mm, eventually 10 mm.  The C-arm fluoro was  again placed into the field and awl used to make an entry point over the  lateral aspect of the intersection of the transverse process with a  superior articular process of L2, the lateral aspect of the pedicle here  observed on C-arm fluoro to be in correct positional alignment.  A hand-  held pedicle probe was then used probe the pedicle on the left side at  L2 level, probed to depth of 40 mm, a 45-mm screw was chosen, to allow  for some length in order for the rod to connect up to the previous screw  fastener level.  A 45-mm screw x 4.75 was chosen, tapping with a 4.75  tap after probing with a ball tip probe.  Decortication of the  transverse process carried out and Vitoss was then charged using bone  marrow aspirate from the right iliac crest to a separate fascial  incision on the right side, lower end of the incision site.  Separate  plain was carried out to the posterosuperior iliac crest sites where the  trocar was introduced into the posterior iliac crest for aspiration  purposes.  A 10 mL of bone marrow  was aspirated, this was used to charge  the [employer].  The 45-mm 4.75 screw was then  placed on the left side and  obtained excellent purchase.  Attention was then turned to the right  side where similar screw was placed using an awl to make an entry point  over the posterolateral aspect of the pedicle of L2 near the lower end  of the transverse process and resection with the lateral aspect of the  superior articular process of L2.  So, we observed on C-arm fluoro to be  in good positional alignment and pedicle probe used to probe the  pedicle, then ball tip probe used to check pedicle for correctness.  Screw was then placed without difficulty following decortication of the  transverse process of L2.  Once the screw was placed, Vitoss was used to  extend from the transverse process of L3 to L2 bilaterally.  Attention  turned to the TLIF on the left side with the thecal sac and L2 nerve  root had been mobilize and entering into the disk space had already been  obtained with dilatation of the disk space.  Disk space was then  debrided of degenerative disk material and cartilaginous endplates using  curettage, upbiting left, upbiting right curettes and ring curettes and  this was fully completed, debridement of the pituitaries carried out.  Attention then turned to trialing 9-mm, provided best fit, so a 9-mm  lordotic cage was chosen.  This was packed with local bone graft to  obtain from removal of the central element as well as the left side of  the facet at the L2-3 level.  This was packed within the cage,  additional bone graft was packed within the intervertebral disk space  and packed it in place with a 7-mm trial spacer.  Prominent 9-mm  lordotic cage was then impacted into place in correct degree rotation  with the missing ties facing upwards and downwards.  Capturing the  endplates nicely, we placed in about 35 degrees of convergence.  Subset  beneath the posterior aspect of the disk space by 3-4 mm observed on C-  arm fluoro to be in good positional alignment in  lateral plane.  Rods  were then carefully fashioned to be placed into the fasteners of the L2  to L5 levels, left side, this was done easily with very little  contouring on the right side.  Further contouring was necessary.  These  rods were then carefully placed and screw caps tightened of the L3 to L5  level in neutral alignment, no compression or distraction obtained here.  Compression was then obtained on the left side between the L2 fastener  and the L3 fastener and the cap of the L2 level on the left side was  tightened to 80 foot pounds.  On the right side with tightening or  compression of the fasteners, there was sudden give on the right side at  the L2 level indicating either pedicle fracture, need to revise the  screw  at the L2 level.  C-arm fluoro was brought into the field, the  screw noted to be superior to its expected position.  The rod was then  subsequently removed and removing the fastener caps, then the screw at  the L2 level was removed, then using direct C-arm fluoro careful  positioning of an entry point into the pedicle of the L2 was performed  on the right side, first using the awl, high-speed burs and easily  removing the  overhanging facet on the right side and convergence  increased.  This we were able to pierce the center of the pedicles on  the right side using a handheld pedicle probe in the correct positional  alignment in correct degree of convergence.  Measured for depth 45-mm  screw was chosen from this level, no tapping performed, the screw was  then obtaining excellent purchase on the right L2 pedicle.  This then  the rod on the right side was then again contoured and placed within the  fasteners extending from L2 to L5 caps placed without difficulty.  Screw  fasteners then tightened to 80 foot pounds from L3, L4, to L5 down to  compression here.  Compression then obtained between L2 and L3 on the  right side fastener and the fastener at L2 was then  tightened to 80 foot  pounds.  Remaining Vitoss was then placed posterolaterally on both  sides.  Permanent C-arm images AP and lateral planes documented fixation  of the screws and rods in good positional alignment.  Careful  examination of the left-sided area of recurrent disk herniation was  checked using hockey stick neuro probe.  Neuroforamen appeared to be completely free from the L2 nerve root as to the L3 nerve root and  neuroforamen.  This then, a medium Hemovac drain was placed in the depth  of the incision over the right side of lumbar spine exiting over the  left lower lumbar spine.  Following irrigation, the lumbodorsal fascia  was reapproximated with residual elements of L1 and L2 superiorly and S1  inferiorly.  The paralumbar muscles reapproximated loosely with  interrupted #1 Vicryl sutures.  Lumbodorsal fascia reapproximated in the  midline with interrupted #1 Vicryl sutures.  Deep subcu layer  approximated with interrupted #1 and 0 Vicryl sutures, more superficial  layers with interrupted 2-0 Vicryl sutures and the skin closed with  running subcu stitch of 4-0 Vicryl.  The patient then had Dermabond  applied, 4x4s, ABD pad, fixed the skin with Hypafix tape.  All  instruments and sponge counts were correct.  The patient was returned to  supine position, reactivated, extubated, and returned to recovery room  in satisfactory condition.  All instruments and sponge counts were  correct.      Jessy Oto, M.D.  Electronically Signed     JEN/MEDQ  D:  07/28/2008  T:  07/29/2008  Job:  [id number]

## 2010-07-15 NOTE — Discharge Summary (Signed)
[name], [name]              ACCOUNT NO.:  [id number]   MEDICAL RECORD NO.:  [id number]          PATIENT TYPE:  INP   LOCATION:  5028                         FACILITY:  [facility]   PHYSICIAN:  Jessy Oto, M.D.   DATE OF BIRTH:  [birth year]   DATE OF ADMISSION:  04/06/2005  DATE OF DISCHARGE:  04/10/2005                                 DISCHARGE SUMMARY   ADMISSION DIAGNOSES:  1.  Central canal spinal stenosis L3-4 and L4-5 with right L5-S1 lateral      recess stenosis.  2.  Hypertension.  3.  Type 2 diabetes mellitus.  4.  Arthritis.  5.  Gastroesophageal reflux disease.  6.  History of kidney stones.   DISCHARGE DIAGNOSIS:  1.  Central canal spinal stenosis L3-4 and L4-5 with right L5-S1 lateral      recess stenosis.  2.  Hypertension.  3.  Type 2 diabetes mellitus.  4.  Arthritis.  5.  Gastroesophageal reflux disease.  6.  History of kidney stones.  7.  Postoperative tachycardia requiring cardiology consult.  8.  Mild posthemorrhagic anemia without need for transfusion.   PROCEDURE:  On April 06, 2005, the patient underwent central laminectomy  L3-4 and L4-5 with a right L5-S1 lateral recess decompression performed by  Dr. Louanne [name], assisted by Phillips Hay P.A.C., under general anesthesia.   CONSULTATIONS:  [facility].   BRIEF HISTORY:  The patient is a 77 year old female with lumbar stenosis for  several years. Extensive conservative management including epidural steroid  injections, pain medications and physical therapy have not given her relief  of her symptoms. She has continue with consistent complaints of bladder  difficulties as well as increasing neurogenic claudication. MRI studies had  indicated severe lumbar spinal stenosis at L3-4, L4-5 with right-sided L5-S1  lateral recess stenosis. It was felt she would require surgical intervention  and was admitted for the procedure as stated above.   BRIEF HOSPITAL COURSE:  The patient's neurovascular  function of the lower  extremities was intact throughout the hospital stay. On postoperative day  #1, her Hemovac drain was discontinued and dressing was changed daily  thereafter showing no signs of infection. She was placed on ileus  precautions, including IV Reglan and clear liquid diet until flatus. As she  did have flatus and no nausea, her diet was gradually increased to a low  carbohydrate diet. She was given PCA analgesics initially and weaned to p.o.  analgesics without difficulty. Postoperatively, the patient had mild  posthemorrhagic anemia. This was monitored throughout the hospital stay and  she did not require blood transfusion.   Physical therapy assisted the patient in ambulation and gait training.  Lumbar corset was provided by [employer]. The patient was able to don and doff  the corset without difficulty and did utilize this for ambulation. She was  initially slow with ambulation. She did require a youth-type rolling walker  secondary to her height. This was obtained through the physical therapy  department at the hospital. Occupational therapy assisted her with ADLs and  she tolerated this without difficulty.   On the second postoperative  day, the patient did develop tachycardia. She  had no shortness of breath or chest pain. O2 sats range from 90-92% with  blood pressure 176/79. EKG was done showing an elevated rate at 125 with  possible inferior or anterior infarct. A cardiology consult was obtained as  well as serial cardiac enzymes. The patient was seen by the cardiologist on  call for the [facility] who recommended Lopressor, gentle hydration  and monitoring of her labs and EKG. She was noted to be stable from the  standpoint of these entities. Her discharge was held until her cardiac  status was felt to be stable.   On April 10, 2005, she was doing quite well. She was ambulating 150 feet  and her pain was well-controlled p.o. analgesics. No nausea. She  was taking  a regular diet and voiding and having bowel movement. She was felt stable  from a cardiac and orthopedic standpoint to be discharged home.   PERTINENT LABORATORY VALUES:  EKG as stated above. In comparison, EKGs  elevated rate was the only change. CBC on admission was within normal  limits. Prior to discharge, hemoglobin and hematocrit were stable at 11.2  and 32.4. Chemistry studies on admission were within normal limits with the  exception of glucose 149. Throughout the hospital stay, the patient had BMET  drawn showing a change in sodium to 131, however, normalized prior to  discharge. Potassium 3.5 also normalized prior to discharge. Cardiac enzymes  as well as troponin were all normal during the hospital stay. Chest x-ray on  April 09, 2005 showed minimal bibasilar atelectasis.   PLAN:  The patient was discharged to home. Arrangements were made for home  health physical therapy evaluation by [place]. Durable medical equipment  were made available for the patient. She was instructed in wearing her  corset for walking. She will follow up with Dr. Louanne [name] two weeks from the  date of her surgery. She will continue on her diabetic diet. The patient is  encouraged to change her dressing daily and is allowed to shower at home.   MEDICATIONS GIVEN AT DISCHARGE:  1.  Vicodin 1-2 every 4-6 hours as needed for pain.  2.  Robaxin 500 milligrams 1 every 8 hours as needed for spasm.  3.  Over-the-counter stool softener daily.  4.  Toprol XL 25 milligrams once daily added to her usual regimen of      medications.   She is instructed to call Dr. Otho Ket office should she have questions or  concerns prior to her return office visit.   CONDITION ON DISCHARGE:  Stable.      Epimenio Foot, P.A.      Jessy Oto, M.D.  Electronically Signed   SMV/MEDQ  D:  05/17/2005  T:  05/18/2005  Job:  [id number]

## 2010-07-15 NOTE — Discharge Summary (Signed)
[name], [name]              ACCOUNT NO.:  [id number]   MEDICAL RECORD NO.:  [id number]          PATIENT TYPE:  INP   LOCATION:  [id number]                         FACILITY:  [facility]   PHYSICIAN:  Jessy Oto, M.D.   DATE OF BIRTH:  03/16/1933   DATE OF ADMISSION:  11/06/2006  DATE OF DISCHARGE:  11/10/2006                               DISCHARGE SUMMARY   ADMISSION DIAGNOSIS:  1. Severe right L3 and L4 foraminal stenosis, left L3-4 lateral recess      stenosis.  Collapsing degenerative scoliosis pattern involving the      mid lumbar segments with recurring foraminal stenosis.  Status post      previous central laminectomy L3-4 and L4-5.  2. Hypertension  3. Diabetes mellitus.  4. Gastroesophageal reflux disease.  5. Dyslipidemia.   DISCHARGE DIAGNOSIS:  1. Severe right L3 and L4 foraminal stenosis, left L3-4 lateral recess      stenosis.  Collapsing degenerative scoliosis pattern involving the      mid lumbar segments with recurring foraminal stenosis.  Status post      previous central laminectomy L3-4 and L4-5.  2. Hypertension  3. Diabetes mellitus.  4. Gastroesophageal reflux disease.  5. Dyslipidemia.  6. Posthemorrhagic anemia requiring blood transfusion.  7. Urinary tract infection.   PROCEDURE:  On November 06, 2006, the patient underwent left L2-3  lateral recess decompression, right-sided L3-4 and L4-5 facetectomies  with right L3-4 and L4-5 transforaminal lumbar interbody fusion.  Posterolateral fusion L3-4 and L4-5 utilizing local bone graft and  VITOSS bone marrow aspirate with pedicle screws and rods L3 to L5.  This  was performed by Dr. Louanne Skye, assisted by Phillips Hay, PA-C  under  general anesthesia.   CONSULTATIONS:  None.   BRIEF HISTORY:  A 77 year old female status post previous central  laminectomies at L3-4 and L4-5 initially with good relief of her pain.  She has redeveloped increasing neurogenic claudication particularly in  the right leg in the  L3 and L4 distribution as well as left-sided  buttock and thigh pain.  Conservative management including epidural  steroid injections has helped with her pain but no longer is giving her  relief.  MRI has demonstrated significant foraminal entrapment affecting  the right L3 and L4 nerve roots and left-sided L2-3 lateral recess  stenosis affecting the L2 and L3 nerve roots.  It was felt that she  would require surgical intervention as stated above and was admitted for  the procedure.   BRIEF HOSPITAL COURSE:  The patient tolerated the procedure under  general anesthesia without complications.  Postoperatively neurovascular  motor function was intact in the lower extremities. The patient utilized  PCA medications initially for her pain control and was weaned to oral  analgesics through the hospital stay.  The patient's bowel function was  monitored closely and laxatives and stool softeners used as needed.  She  was able to advance her diet to a regular diet as she had flatus and  bowel movement.  A Foley catheter was discontinued with urine sent for  urinalysis.  The patient was noted to have  7-12 WBCs per high power  field.  She was started on Augmentin for her urinary tract infection on  November 09, 2006.  The patient developed atelectasis postoperatively.  Chest x-ray showed no active disease.  She was doing oxygen saturations  98-99% on room air prior to discharge.  At discharge she was afebrile  with vital signs stable.  Her wound was dressed daily.  Hemovac drain  removed on the first postoperative day without difficulty.  No drainage  from the wound throughout the remainder of the stay.   OTHER PERTINENT LABORATORY VALUES:  The patient was given 2 units of  autologous blood during the hospital stay.  Hemoglobin and hematocrit on  admission noted to be 9.8 and 29.5.  Postoperatively dropped to the  lowest value of 8.2 and 24.0 respectively.  At discharge, hemoglobin and   hematocrit stable at 9.1 and 27.8.  Chemistry studies on admission  within normal limits with the exception of glucose 128, BUN 27.  Postoperatively hyponatremia at 134 resolved to 139 prior to discharge.  The patient's hemoglobin A1c on September 9 was noted to be 6.8.  She  was treated with sliding scale during the hospital stay.  Urinalysis as  stated above.  Chest x-ray as stated above.  EKG on admission normal  sinus rhythm with slower rate noted when compared to EKG of April 08, 2005 confirmed by Dr. Claiborne Billings.   PLAN:  The patient was discharged to her home.  She will receive  physical therapy there.  She is to have ambulation and gait training  utilizing a brace when out of bed.  The brace was fitted during the  hospital stay and she will utilize this at home at all times with the  exception of sleeping.  No bending, lifting or twisting.  The patient  will avoid driving for 2 weeks.  She will use a walker for ambulation.  She will continue on a diabetic diet.  Dressing to be changed daily at  home.  Medications given at discharge include Percocet 5/325 one to two  every 4-6 hours as needed for pain, Robaxin 500 mg one every 8 hours as  needed for spasm, Augmentin 500 mg one p.o. q.8 h, Trinsicon one p.o.  b.i.d.  A medication reconciliation form was completed and the patient  will resume home medications as indicated on the form. This was  discussed with the patient at discharge.  The patient will return to see  Dr. Louanne Skye 2 weeks from the date of her surgery.  All questions  encouraged and answered.   CONDITION ON DISCHARGE:  Stable.      Epimenio Foot, P.A.      Jessy Oto, M.D.  Electronically Signed    SMV/MEDQ  D:  03/05/2007  T:  03/05/2007  Job:  [id number]

## 2010-07-15 NOTE — H&P (Signed)
[facility]  Patient:    [name], [name] Visit Number: [id number] MRN: [id number]          Service Type: Attending:  Selinda Orion, M.D. Dictated by:   S. Olena Mater, M.D. Adm. Date:  03/27/01                           History and Physical  CHIEF COMPLAINT:  Pelvic pressure and feeling that things are falling out. Some urge incontinence.  She denies stress incontinence.  HISTORY OF PRESENT ILLNESS:  [name] is a 77 year old, gravida 2, para 2 female who is status post hysterectomy in 1987 with removal of both ovaries. She is currently on  ______ 0.5 mg b.i.d. for repair of cystocele, rectocele, and possible small enterocele.  She is on Detrol LA to control her urge incontinence.  PAST SURGICAL HISTORY:  She is also status post tubal ligation, cholecystectomy, rotator cuff repair, and fixation of left ankle 30 years ago. She has had a lumbar laminectomy in 1995 and a rotator cuff in 1999.  PAST MEDICAL HISTORY:  The patient has had skin cancer on her face, a squamous cell carcinoma.  The patient is a borderline diet-controlled diabetic.  REVIEW OF SYSTEMS:  HEENT:  She wears glasses, but noticed no decrease in visual or auditory acuity.  Heart:  She is on Lotrel 5/20 mg a day for control of hypertension.  Denies chest pain.  Denies any shortness of breath.  Lungs: No chronic cough.  No asthma.  GU:  She specifically denies stress incontinence, but does have a great deal of pelvic pressure.  No history of UTIs or recurrent UTIs.  GI:  No bowel habit change.  No melena.  No anorexia.  No weight loss.  SOCIAL HISTORY:  She is retired.  FAMILY HISTORY:  Her mother is deceased at age 62.  Her father died at 73 of heart problems.  She has two brothers and two sisters.  One sister had a CVA and is in a nursing home and another is living and well with hypertension. Her brother and father both have had heart disease. MEDICATIONS: 1. Motrin 800 mg  b.i.d. 2. Pepcid 20 mg b.i.d. 3. ______ 4. Lotrel. 5. Detrol.  PHYSICAL EXAMINATION:  A well-developed, well-nourished female who appears to be her stated age.  WEIGHT:  176 pounds.  VITAL SIGNS:  Blood pressure 150/100, pulse 80, respirations 16.  HEENT:  Examination of the ears, nose, and throat is unremarkable.  The oropharynx is not injected.  NECK:  Supple.  The thyroid is not enlarged.  Carotid pulses are equal bilaterally without bruits.  LUNGS:  Clear to P&A.  BREASTS:  No masses or tenderness.  HEART:  Normal sinus rhythm.  No murmurs.  ABDOMEN:  Obese.  Bowel sounds are normal.  No palpable masses are noted.  EXTREMITIES:  Good range of motion and equal pulses and reflexes.  PELVIC:  A second and third cystocele and moderate rectocele.  There may be a small enterocele as well.  No pelvic masses are noted.  RECTAL:  Hemoccult is negative.  IMPRESSION: 1. Symptomatic pelvic relaxation with pelvic pressure. 2. Cystocele. 3. Rectocele.  PLAN:  A&P repair.  Also note that the patient and husband have been informed of risks and benefits. Dictated by:   S. Olena Mater, M.D. Attending:  Selinda Orion, M.D. DD:  03/26/01 TD:  03/26/01 Job: [id number]

## 2010-07-15 NOTE — Group Therapy Note (Signed)
Thursday, February 08, 2006:   Purpose of Evaluation:  Evaluate and treat chronic low back pain.   HISTORY OF PRESENT ILLNESS:  [name] is a pleasant 77 year old adult  female referred to this office by Dr. Louanne Skye for evaluation and treatment  of chronic low back pain.   Numerous medical records accompany the patient. It appears that she  initially developed pain in her low back on a gradual basis starting  back in mid-2004. Dr. Louanne Skye at first saw the patient February 17, 2003  for this gradual onset of back pain. X-rays at that time showed  degenerative disc disease, especially at L5-S1 with mild changes at L3-4  level.   March 03, 2003: The patient  underwent an MRI scan of her lumbar spine  which showed bi-foraminal stenosis at L5 and S1 with chronic  degenerative disc disease and annular disc bulge along with osteophytes  causing probable bilateral L5 nerve root encroachment with no definite  S1 nerve root encroachment. There was also mild-to-moderate  multifactorial spinal stenosis at L3-4 with mild right-sided foraminal  narrowing. Additionally, there was borderline central stenosis at L4-5  due to annular disc bulging and facet hypertrophy.   The patient saw Dr. Louanne Skye back in January of 2005 and he referred her  for epidural steroid injections. She had a series of three with Dr.  Nelva Bush in early 2005, but gained no significant relief.   The patient  underwent EMG nerve conduction studies at the request of  Dr. Louanne Skye in May and June of 2005, and this showed chronic right L5  radiculopathy.   November 10, 2003: The patient continues to see Dr. Louanne Skye and was  continued on tramadol at 1 to 2  tablets q. 8 hours p.r.n. She was also  prescribed Celebrex 200 mg b.i.d. Hemoglobin A1C was slightly elevated  at 6.7. She received a shoulder injection on the left side at that time.   September 13, 2004: The patient saw Dr. Louanne Skye in [place]. At that time, she  underwent right L4-5  epidural steroid injections which reportedly gave  her temporary relief.   The patient  underwent subsequent epidural steroid injections also with  Dr. Nelva Bush, but those gave her minimal relief.   March 07, 2005: The patient  underwent followup MRI scan of her lumbar  spine which was compared to this previous study two years ago. At that  time, it was noted that she had progressive annular disc bulging along  with facet disease at L3-4 and L4-5 resulting in moderate-to-severe  spinal stenosis at both levels. There was also foraminal stenosis which  had progressed at L3-4 causing L3 nerve root encroachment. There were  stable chronic degenerative changes and postoperative changes at L5-S1  with chronic foraminal stenosis bilaterally. There was a stable annular  disc bulging eccentric to the left at L2-3.   April 06, 2005:  The patient  underwent cervical laminectomy at L3-4  and L4-5. It was also noted that she had right L5-S1 lateral recess  stenosis. This surgery was performed by Dr. Louanne Skye.   April 24, 2005:  The patient followed up with Dr. Louanne Skye and was still  reporting slight back pain requiring Vicodin 1 q. 4 hours. She had leg  pain only at night at that time. He requested she continue progressive  ambulation.   The patient reports that she was involved in outpatient physical therapy  at [facility] for two months postoperatively.   June 05, 2005:  The  patient  underwent a followup MRI scan of her lumbar  spine with and without contrast. This showed postoperative changes from  a wide decompressive laminectomy at L3-4 and L4-5 with no spinal or  lateral recess or foraminal stenosis. There was extensive enhancing  fibrosis around the thecal sac. There was stable, remote right L5-S1  laminectomy along with diffuse annular bulging at L2-3 with no spinal  stenosis or lateral recess stenosis.   The patient followed up with Dr. Louanne Skye in April of 2007. He again   suggested followup postoperative epidural steroid injections, and these  were performed in April and May 2007. There was also a left L4 selective  nerve block done by Dr. Nelva Bush along with epidural steroid injections at  L5-S1. Those gave her no benefit according to her reports.   August 04, 2005:  The patient continued to see Dr. Louanne Skye and was prescribed  Vicodin 1 to 2 tablets q. 4-6 hours p.r.n. Dr. Louanne Skye wished to try to  wean her off the medication. She was also tried on Mobic 15 mg q. day.  She was given a physical therapy flexion and extension program.   September 01, 2005:  The patient followed up with Dr. Louanne Skye and had some  improvement in her left hip pain with therapy.   August 2007:  The patient was prescribed Percocet 5/325 one to two  tablets p.o. q. 4-6 hours p.r.n.   December 21, 2005:  The patient saw Dr. Sherwood Gambler, a local neurosurgeon  for a second opinion. They recommended no surgical intervention at that  time.   January 03, 2006:  The patient saw Dr. Louanne Skye and was prescribed Lyrica  75 mg initially once a day and then increasing to b.i.d. Her Percocet  was also refilled at that time. The diagnosis on the right was L3-4  nerve root encroachment secondary to bony overgrowth along with central  disc bulge. Her left-sided symptoms were related to foraminal entrapment  of the L5 nerve root with no associated lateral recess stenosis  affecting the S1 nerve root. It was recommended that she try to avoid  surgery which would involve extensive decompression and possible total  facetectomy along with  fusion. She was continued on Lyrica and Percocet  at that time.   The patient reports that last Friday she saw Dr. Louanne Skye in followup, and  her Celebrex was made 200 mg b.i.d. along with her Lyrica being 75 mg  b.i.d. She was prescribed Percocet 5/325 two tablets q. 4 hours p.r.n.  and she generally uses anywhere from 6-10 tablets per day, mostly using 8 tablets per day. She reports  that she had been previously prescribed  oxycodone but when that was prescribed she was allowed only 1 tablet at  a time. She previously has tried hydrocodone, but has not noticed much  benefit. She does try to use heating pads on her legs occasionally. She  complains of bilateral hip and leg, but it is much worse on the left.  Her pain medicines do give her some temporary benefit from her pain.  Most of the pain is in the left hip and radiating into the left leg,  especially below knee level. She has some pain in the right hip into the  right leg, but it is much more severe on the left.   PAST MEDICAL HISTORY:  1. History of lumbar surgery L5-S1 for disc protrusion.  2. History of L3-4 surgery April 06, 2005.  3. History of non-insulin dependent  diabetes mellitus, treated by Dr.      Luan Pulling.  4. History of bilateral rotator cuff repair with occasional pain.  5. Hypertension.  6. History of hyperactive bladder, presently on oxybutynn and patch.   ALLERGIES:  No known drug allergies.   SOCIAL HISTORY:  The patient is married with one grown daughter who is  alive and well. There is a son who died at age 75 from an enlarged  heart. The patient does not use alcohol or tobacco. 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   REVIEW OF SYSTEMS:  Positive for high blood sugar.   MEDICATIONS:  1. Celebrex 200 mg b.i.d.  2. Glyburide/metformin 2.5/500 three pills daily.  3. Estradiol 0.5 mg 2 tablets daily.  4. Metoprolol 50 mg one half tablet daily.  5. Prilosec 20 mg daily.  6. Calcium with Vitamin D 600 mg 2 tablets daily.  7. Senna-S 2 tablets daily.  8. Glucosamine Chondroitin 400 mg 2 tablets daily.  9. Aspirin 81 mg daily.  10.Lotrel/benazepril 10/20 one tablet daily.  11.Percocet 5/325 two tablets q. 4 hours p.r.n. (6-10 per day).  12.Oxytrol patch 3.9 mg change q. Week.  13.Lyrica 75 mg b.i.d.   PHYSICAL EXAMINATION:  Reasonably well-appearing elderly adult  female in  mild acute discomfort. Blood pressure 130/59 with a pulse of 72,  respiratory rate 16, O2 saturation of 96% on room air. Height was 5 feet  2 inches, weight 173 pounds. The patient ambulates without any assisted  device. She does not have any significant balance problems, although she  reports a history of balance problems. She is able to minimally toe walk  and heel walk bilaterally. Upper extremity range of motion was within  functional limits with slight decrease in shoulder abduction and  flexion. Upper extremity exam showed 5-/5 strength throughout. Bulk and  tone were normal. Reflexes were 2+ and symmetrical. Sensation was intact  to light touch throughout the bilateral upper extremities. Lower  extremity exam showed 4+/5 strength in hip flexion, knee extension and  ankle dorsi flexion. Sensation was intact to light touch throughout the  bilateral lower extremities. Reflexes were 1+ at the bilateral knees and ankles. In the stand position, lumbar range of motion was within  functional limits with only slight decrease in lateral bending and  rotation. In the supine position, hip range of motion was normal  bilaterally and straight leg raise was negative bilaterally to 30  degrees.   Examination of her back showed well healed mid-to-lower lumbar scar  without any open areas or drainage.   IMPRESSION:  1. Status post L5-S1 and L3-4 laminectomy for significant lumbar      spondylosis/stenosis/degenerative disc disease.  2. Non-insulin diabetes mellitus.   At the present time, the patient has had lumbar surgery at L5-S1 and  then the more recent surgery at L3-4, the second one having been  performed by Dr. Louanne Skye. She has persistent pain that has not been  responsive to time nor therapy or epidural steroid injections. She has  responded to some degree to Percocet, but is using up to 10 tablets per  day of the Percocet, taking in at least 3500 mg of Tylenol daily when  she  does take the 10 tablets. We have decided to revert to the oxycodone  that she has previously tried, but to allow her to use 2 tablets of the  5 mg strength, up to 4 times per day. She will be continuing on the  Celebrex and Lyrica as noted above. She presently gets Celebrex samples  from her primary care physician and she has three refills on the Lyrica  from prior prescription. She understands that all pain medicines need to  come through this office in the future. We will be prescribing the  Celebrex and Lyrica, although she gets samples and probably can continue  that for at least the time being with Dr. Luan Pulling. We will subsequently  take on prescribing the Lyrica if we continue that in the future as is  expected. We will plan on seeing the patient in followup in this office  in approximately two month's time with refills of the  oxycodone prior to that time. We did obtain a urine drug screen in the  office today, but I expect that we will have no problems with compliance  with this patient. We will plan on seeing her in followup as noted  above.           ______________________________  Jarvis Morgan, M.D.     DC/MedQ  D:  02/08/2006 10:36:00  T:  02/08/2006 11:35:33  Job #:  [id number]

## 2010-07-15 NOTE — Consult Note (Signed)
[name], [name] NO.:  [id number]   MEDICAL RECORD NO.:  [id number]          PATIENT TYPE:  REC   LOCATION:  [facility]                          FACILITY:  [facility]   PHYSICIAN:  Felicie Morn, M.D. DATE OF BIRTH:  04/09/1933   DATE OF CONSULTATION:  02/06/2006  DATE OF DISCHARGE:                                 CONSULTATION   Dear Randall Hiss,   Thank you kindly for sending [name] my way.  As you know, she had a  raised, hyperpigmented lesions on her lower left leg in the medial  aspect.  I have performed excisional biopsy completely around this and  sent this for pathology at [facility].  She has some other  pigmented lesions which I will look at later.  I will make sure you get  a copy of the pathology report for your records.   Again, I thank you as always for your confidence in sending your  patients my way.      Felicie Morn, M.D.  Electronically Signed     WB/MEDQ  D:  02/06/2006  T:  02/06/2006  Job:  [id number]   cc:   Gaston Islam. Tressie Stalker, MD  Fax: [phone]

## 2010-07-15 NOTE — Assessment & Plan Note (Signed)
[name] returns to the clinic today for follow-up evaluation.  I  first and last saw her in this office February 08, 2006 on referal from  Dr. Louanne Skye for evaluation and treatment of chronic low back pain.  At  that time we had decided to use oxycodone 5 mg and allow her to use 1 or  2 tablets p.o. q.i.d. p.r.n. a maximum of 8 per day.  She has been using  anywhere from 4 to 8 per day but tries to avoid over using the  medication.  She reports that she also uses the Lyrica and Celebrex and  gets some relief from the combination.  She generally takes the 2  oxycodone at a time and can get relief for 4 to 5 hours.  She still has  limited activity secondary to her pain.  She reports most of her pain  being most severe in the left leg down the back and occasionally pain in  her right knee up her leg to her hip on the right.  She reports that her  recent hemoglobin A1c came back at 7.9.   MEDICATIONS:  1. Celebrex 200 mg b.i.d.  2. Glyburide/metformin 2.5/500 four tablets q. day.  3. Estradiol 0.5 mg 2 tablets q. day.  4. Metoprolol 50 one-half tablet q. day.  5. Prilosec 20 mg q. day.  6. Calcium with vitamin D 600 mg 2 tablets q. day.  7. Senna-S 2 tablets q. day.  8. Glucosamine chondroitan 400 mg 2 tablets q. day.  9. Aspirin 81 mg q. day.  10.Lotrel 10/20 1 tablet q. day.  11.Oxycodone 5 mg 1 to 2 tablets p.o. q.i.d. p.r.n. (4 to 8 per day).  12.Oxytrol patch 3.9 mg change weekly.  13.Lyrica 75 mg b.i.d.   REVIEW OF SYSTEMS:  Positive for urinary retention.   PHYSICAL EXAMINATION:  Well-appearing, adult female in mild acute  discomfort.  Blood pressure is 165/87, with a pulse 77, respiratory rate  16 and O2 saturation 97% on room air.  She has 4+/5 strength throughout  the bilateral upper and lower extremities.  Bulk and tone were normal.  Reflexes were 2+ and symmetrical.   IMPRESSION:  1. Status post L5-S1 and L3-4 laminectomy for significant lumbar  spondylosis/stenosis/degenerative disc disease.  2. Non-insulin-dependent diabetes mellitus, poorly controlled.   In the office today we did refill the patient's oxycodone as of April 14, 2006.  She continues to be compliant with the medication taking only  the amount absolutely necessary.  She is getting good analgesic effect  with no signs of diversion.  We will plan on seeing her in follow-up in  approximately 2 months' time.           ______________________________  [name], M.D.     DC/MedQ  D:  04/06/2006 09:50:43  T:  04/06/2006 11:21:35  Job #:  [id number]

## 2010-07-15 NOTE — Op Note (Signed)
[facility]  Patient:    [name], [name] Visit Number: [id number] MRN: [id number]          Service Type: GYN Location: 4W 0464 01 Attending Physician:  Richarda Blade Dictated by:   Chauncey Cruel. Olena Mater, M.D. Proc. Date: 03/27/01 Admit Date:  03/27/2001                             Operative Report  PREOPERATIVE DIAGNOSES:  Pelvic relaxation with cystocele, rectocele, and enterocele.  POSTOPERATIVE DIAGNOSES:  Repair of enterocele, anterior repair, posterior repair and perineoplasty.  DESCRIPTION OF PROCEDURE:  The patient was placed in the supine position, prepped and draped in the usual fashion. The apex of the vagina was grasped and the bladder was pushed off of the cuff. A defect in the anterior vagina about that level was identified and closed with interrupted sutures of 2-0 Vicryl. We did not get in the peritoneal cavity. The vagina was then dissected off the pubocervicovaginal fascia and this was then plicated in the midline with interrupted sutures of 4-0 Vicryl. A wedge of the vagina was excised and then the vagina was closed with interrupted sutures of 4-0 Vicryl. Following this, we went down below and dissected the vagina from the prerectal fascia and closed this in the midline with interrupted sutures of 3 and 4-0 Vicryl. A wedge of the vagina was removed anteriorly and posteriorly and then the vagina was closed posteriorly with a locking suture of 3-0 chromic. Perineorrhaphy was then accomplished by closing the transverse perinei muscle and bulbocavernosus muscle with interrupted sutures of 3-0 Vicryl. No pack was inserted. A suprapubic catheter was inserted, urine was clear. She tolerated this procedure well and was sent to the recovery room in good condition. Dictated by:   S. Olena Mater, M.D. Attending Physician:  Richarda Blade DD:  03/27/01 TD:  03/28/01 Job: [id number]

## 2010-07-15 NOTE — Op Note (Signed)
[name], [name]              ACCOUNT NO.:  [id number]   MEDICAL RECORD NO.:  [id number]          PATIENT TYPE:  INP   LOCATION:  5028                         FACILITY:  [facility]   PHYSICIAN:  Jessy Oto, M.D.   DATE OF BIRTH:  Oct 07, 1933   DATE OF PROCEDURE:  04/06/2005  DATE OF DISCHARGE:                                 OPERATIVE REPORT   PREOPERATIVE DIAGNOSIS:  Central canal spinal stenosis, L3-4 and L4-5, right  L5-S1 lateral recess stenosis.   POSTOPERATIVE DIAGNOSIS:  Central canal spinal stenosis, L3-4 and L4-5,  right L5-S1 lateral recess stenosis.   OPERATION PERFORMED:  Central laminectomy, L3-4 and L4-5, right L5-S1  lateral recess decompression.   SURGEON:  Jessy Oto, M.D.   ASSISTANT:  Epimenio Foot, P.A.   ANESTHESIA:  General via orotracheal intubation, Dr. Tobias Alexander.   SPECIMENS:  None.   ESTIMATED BLOOD LOSS:  150 mL.   COMPLICATIONS:  None.   DRAINS:  Hemovac x 1, right lower lumbar and Foley to straight drain.   Patient returned to the PACU in good condition.   HISTORY OF PRESENT ILLNESS:  The patient is a 77 year old female who has had  problems with lumbar spinal stenosis for several years.  She has undergone  extensive conservative management including the use of epidural steroids.  She does have consistent complaints of bladder difficulties.  She has been  having increasing neurogenic claudication.  She has good strength in both  lower extremities, has undergone work-up and her studies have indicated  severe lumbar spinal stenosis, L3-4 and L4-5 with right-sided L5-S1 lateral  recess stenosis.  She has persistent pain, discomfort and continued  necessity of epidural steroids and it was felt that decompression was  necessary.   INTRAOPERATIVE FINDINGS:  As above.   DESCRIPTION OF PROCEDURE:  After adequate general anesthesia, the patient  had Foley catheter placed and standard preoperative antibiotic.  She was  placed in a prone  position.  Chest rolls were used.  Jackson table.  TED  hose to prevent DVT.  She underwent standard prep with DuraPrep solution  over the lower dorsal spinal lumbar region.  She was draped in the usual  manner.  Iodine Vi-drape was used.  She had an incision in the midline  extending from approximately S1 to L2 through the skin and subcutaneous  layers using a 10 blade scalpel after infiltration with Marcaine 0.5%  1:200,000 epinephrine.  The lumbodorsal fascia was similarly infiltrated  with Marcaine 0.5% 1:200,000 epinephrine and incision carried down to the  spinous processes of S1, L5, L4 and L3.  Clamps placed on the spinous  process of L5 and L3 and an intraoperative radiograph obtained demonstrating  these to be said level.  Two Cobbs were then used to elevate the paralumbar  muscles off of the posterior aspects of the spinous process laterally, the  right and left side off the posterior aspect of the lamina, off the  posterior aspect of the joints at the L5-S1, L4-5 and L3-4 and L2-3 levels,  preserving the facet capsules at each level.   The patient  then had McCullough retractor placed after bleeders were  controlled using bipolar and monopolar electrocautery.   The first place to be treated was the right L5-S1 level. Leksell rongeur was  used to remove the small portion of the inferior aspect of the lamina of the  right L5 lamina.  Osteotome then used to resect a small portion of the  medial facet approximately 20 to 30%.  A 3 mm Kerrison then used resect  further portion of the inferior aspect of the lamina of L5 on the right side  until the insertion of the ligamentum flavum was identified.  The lateral  recess was then decompressed by resecting medial spurs off the L5-S1 level  from above.  The subarticular area was felt to be quite tight, so  foraminotomy was performed then by placing 2 mm Kerrison over the superior  aspect of the lamina of S1 and then performing  foraminotomy over the S1  nerve root on the right side.  Then continuing up along the superior  articular process of S1 resecting the medial portion of the facet lateral to  its most significant area of encroachment, the subarticular lateral recess.  With this resection then the lateral recess was well decompressed and the S1  nerve root identified.  It was well compressed associated with the  subarticular lateral recess.  It was freely decompressed and followed out  the S1 neural foramen without difficulty.  The L5 neural foramen was then  decompressed identifying the L5 nerve root, resecting the very cranial  portions of the S1 superior articular process and reflecting portions of the  ligamentum flavum that were affecting the 5 root performing decompression  such that a hockey stick probe could be passed out the neural foramen  without difficulty.  Gelfoam was then placed in the lateral recess and the  disk was examined and felt not to show any signs of herniation.  Both the L5  and S1 nerve roots were felt to be well decompressed.  Attention then turned  to the L3-4 and L4-5 levels where a Leksell rongeur was then used to remove  the spinous process of L3 and of L4 and carefully used to thin the central  portions of lamina of L3 and L4.  High speed bur used within the lamina  further to the level near the ligamentum flavum and 2 and 3 mm Kerrisons  were then used to make a central trough removing the central portions of the  lamina of L4 and of L3 up to the L3-4 and L2-3 interspace.  Osteotomes were  then used to resect the medial aspect of the inferior articular process of  L4 bilaterally and L3 bilaterally.  Foraminotomies performed over both L5  nerve roots and both L4 nerve roots and then the medial portion of the  superior articular process of L4 and L3 were then resected using 2 mm Kerrisons loosening and allowing for resection then of the medial reflected  portion of ligamentum  flavum along this joint and it was compressing upon  the thecal sac and causing severe lateral recess and central stenosis.  When  this was done then the L5, L4 and L3 nerve roots were completely  decompressed within their neural foramen.  Each of the nerve roots was  identified and examined using loupe magnification and head lamps and hockey  stick neuro probe passed out bilateral L3, bilateral L4, bilateral L5 out  the neural foramina demonstrating their patency.  Following this, irrigation  was performed.  Thrombin soaked Gelfoam used to obtain hemostasis.  Patient  then had hemostasis obtained using Thrombin soaked Gelfoam and bone wax  applied to the bleeding cancellous bone surfaces.  Careful inspection of  each of the nerve roots demonstrated that they were freely well  decompressed.  The patient underwent Valsalva maneuver to ensure no evidence  of dural tear and none was present.  All excess Gelfoam ws then removed and  a small portion of Gelfoam placed over the central laminotomy region  extending from the L2-3 interspace down to the L4-L5.  A medium Hemovac  drain then placed in the depth of the incision exiting over the right lower  lumbar region.  The paralumbar muscle then loosely approximated in the  midline with interrupted #1 Vicryl sutures.  Lumbodorsal fascia tacked down  to the S1-L5 spinous process caudally and superiorly at the S1 spinous  process using interrupted #1 Vicryl sutures.  Remaining lumbodorsal fascia  approximated with interrupted #1 Vicryl sutures.  Deep subcutaneous layers  approximated with interrupted 2-0 Vicryl sutures.  Skin closed with a  running subcutaneous stitch of 4-0 Vicryl.  Tincture of benzoin and Steri-  Strips applied.  The patient then had  application of 4 x 4s, ABD pad fixed to the skin with Hypafix tape.  She was  then returned to a supine position, reactivated, extubated and return to the  recovery room in satisfactory condition.  All  instrument and sponge counts  were correct.      Jessy Oto, M.D.  Electronically Signed     JEN/MEDQ  D:  04/06/2005  T:  04/07/2005  Job:  [id number]

## 2010-07-15 NOTE — Assessment & Plan Note (Signed)
[name] returns to the clinic today for followup evaluation.  She  reports that Dr. Louanne Skye has done recent injections with a special  lubricant in her right knee.  That is occurring 3 times over the next 6  weeks.  She reports that she gets some fair relief with the oxycodone  use 5 mg 2 tablets 4 times per day.  She only uses 6 to 8 per day and  gets relief for several hours afterwards.  She would like to be able to  use a slightly increased amount.  She continues to use Celebrex and  Lyrica, as noted below, and needs refills on those medications.  She  continues to have bilateral hip and knee pain.   MEDICATIONS:  1. Celebrex 200 mg b.i.d.  2. Glyburide/metformin 2.5/500 four tablets daily.  3. Estradiol 0.5 mg 2 tablets daily.  4. Metoprolol 50 mg 1/2 tablet daily.  5. Prilosec 20 mg daily.  6. Calcium with vitamin D 600 mg p.o. daily.  7. Senna-S 2 tablets daily.  8. Glucosamine chondroitin 400 mg 2 tablets daily.  9. Aspirin 81 mg daily.  10.Lotrel 10/20 one tablet daily.  11.Oxycodone 5 mg 2 tablets p.o. q.i.d. p.r.n. (6 to 8 per day).  12.Oxytrol patch 3.9 mg changed weekly.  13.Lyrica 75 mg b.i.d.   REVIEW OF SYSTEMS:  Noncontributory.   PHYSICAL EXAM:  Well-appearing thin adult female in mild acute  discomfort.  Blood pressure 126/81 with pulse 72, respiratory rate 16, and O2  saturation 95% on room air.  She has 4+/5 strength throughout.  She ambulates without any assistive  device.   IMPRESSION:  1. Status post L5-S1 and L3-4 laminectomy for significant lumbar      spondylosis/stenosis/degenerative disk disease.  2. Non-insulin dependent diabetes mellitus.   In the office today we did change the patient to Roxicodone at 15 mg  strength to be used 1 tablet t.i.d. in place of her oxycodone 5 mg  strength.  We also gave her samples of Celebrex and Lyrica in the office  today at above-noted strength.  Will plan on seeing the patient in  followup in approximately 2  months' time with refills prior to that  appointment if necessary.           ______________________________  Jarvis Morgan, M.D.     DC/MedQ  D:  06/08/2006 15:39:56  T:  06/08/2006 16:21:17  Job #:  [id number]

## 2010-07-15 NOTE — Discharge Summary (Signed)
[facility]  Patient:    [name], [name] Visit Number: [id number] MRN: [id number]          Service Type: GYN Location: 4W 0464 01 Attending Physician:  Richarda Blade Dictated by:   Chauncey Cruel. Olena Mater, M.D. Admit Date:  03/27/2001 Discharge Date: 03/29/2001                             Discharge Summary  ADMISSION DIAGNOSIS:  Genital prolapse with cystocele and rectocele.  OPERATION PERFORMED:  Pelvic reconstruction, anterior posterior repair, enterocele repair, posterior colpoperineoplasty, suprapubic tube insertion.  HISTORY OF PRESENT ILLNESS:  Ms. Kingsland is a 77 year old female who presented to the hospital with pelvic pressure and feelings that things are coming out. She had urge incontinence, but denied stress incontinence.  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  She also had mild hypertension, which was treated with Lotrel, and gastroesophageal reflux disease which was treated with Pepcid.  LABORATORY DATA:  Hemoglobin on admission was 13.  Cholesterol was 239.  Chest x-ray was normal.  HOSPITAL COURSE:  The patient was admitted to the hospital, underwent an A&P repair, enterocele repair, and perineoplasty, suprapubic tube insertion.  She was discharged on 03/29/01, after an uncomplicated postoperative visit.  She was told to begin voiding trials in a couple of days at home, which will be coordinated through the office.  FOLLOWUP:  She will return to our office in approximately two weeks.  DISCHARGE MEDICATIONS: 1. Cipro. 2. Ambien. 3. Percocet for pain.  CONDITION ON DISCHARGE:  Improved. Dictated by:   S. Olena Mater, M.D. Attending Physician:  Richarda Blade DD:  04/04/01 TD:  04/05/01 Job: [id number]

## 2010-07-15 NOTE — Discharge Summary (Signed)
[name], [name]              ACCOUNT NO.:  [id number]   MEDICAL RECORD NO.:  [id number]          PATIENT TYPE:  INP   LOCATION:  5003                         FACILITY:  [facility]   PHYSICIAN:  Jessy Oto, M.D.   DATE OF BIRTH:  01-Sep-1933   DATE OF ADMISSION:  07/28/2008  DATE OF DISCHARGE:  08/01/2008                               DISCHARGE SUMMARY   ADMISSION DIAGNOSES:  1. Recurrent herniated nucleus pulposus, left L2-3 with extruded      fragment over the posterior aspect of vertebral body of L3.  2. Degenerative disk disease, L2-3 above L3-L5 fusion.  3. Diabetes mellitus.  4. Hypertension  5. Gastroesophageal reflux disease.   DISCHARGE DIAGNOSES:  1. Recurrent herniated nucleus pulposus, left L2-3 with extruded      fragment over the posterior aspect of vertebral body of L3.  2. Degenerative disk disease, L2-3 above L3-L5 fusion.  3. Diabetes mellitus.  4. Hypertension  5. Gastroesophageal reflux disease.  6. Posthemorrhagic anemia.  7. Skin blisters over buttocks.   PROCEDURES:  On July 28, 2008, the patient underwent left L2-3  diskectomy, redo with left L2-3 transforaminal lumbar interbody fusion  utilizing DePuy cage and bone graft.  Extension of posterior  instrumentation to the L2-3 level from L3-L5 fusion with exchange of  rods and caps from L3-L5 and extension of hardware to the L2 level.  Posterolateral fusion L2-3 with Vitoss.  Right iliac crest bone marrow  aspiration.  This was performed by Dr. Louanne Skye, assisted by Phillips Hay,  PA-C under general anesthesia.   CONSULTATIONS:  None.   BRIEF HISTORY:  The patient is a 77 year old female, status post L3-L5  fusion.  She did quite well following her fusion, but recently developed  an acute onset of back pain with radiation into the left leg.  She was  found to have disk protrusion at the L2-3 level above and L3-L5 fusion  and underwent epidural steroid injections, which failed to alleviate her  pain.   She then underwent decompression at the L2-3 level with excision  of herniated nucleus pulposus complicated by a dural tear, which was  repaired.  Postoperatively, she had a short period of relief of her  symptoms and again began developing worsening of her pain with radiation  into the left leg.  Followup studies demonstrated a recurrent disk  herniation.  Again, epidural steroid injections were attempted and  unsuccessful in relieving her pain.  It was felt she would require  further surgical intervention in the form of a redo microdiskectomy with  extension of her fusion from the L3-5 level to the L2-3 level utilizing  a TLIF on the left side and posterolateral fusion at the L2-3 level.  After risk and benefits discussed with the patient, she wished to  proceed and was admitted for this procedure.   BRIEF HOSPITAL COURSE:  The patient tolerated the procedure under  general anesthesia without complications.  Postoperatively,  neurovascular motor function of the lower extremities was noted to be  intact.  She did immediately began getting relief of her leg symptoms.  PCA analgesics were  used for pain control and she was gradually weaned  to p.o. OxyContin with OxyIR for breakthrough pain.  This is the  medication in which she was discharged as well.  The patient was noted  to have fluid-filled blisters over her buttocks postoperatively.  This  appeared to be caused by the surgical sterile drapes.  The blisters were  covered with Mediplex and treated throughout the hospital stay with  localized skin care.  At discharge, the blisters had flattened and were  beginning to exfoliate and there was no signs or symptoms of infection.  Dressing changes were taught to the patient and her husband, and they  were instructed to continue these at home.  Her wound from her back was  dry and clean and flat.  Dressing changes were done to this wound as  well with no signs of infection during the hospital  stay.  Diet was held  until bowel function returned.  She was then able to take a regular  diet.  This did consist of low-carbohydrate that she was known diabetic.  Blood sugars were stable during the hospital stay with a slightly  elevated hemoglobin A1c at 6.8.  She initially was treated with sliding  scale insulin and then returned to her usual oral diabetic agents prior  to discharge.  The patient was able to void after her Foley catheter was  discontinued.  Postvoiding bladder scan showed 50 mL.  The patient was  seen by the physical therapist for ambulation and gait training.  She  was given an [employer], which she utilized when out of bed.  She was  able to donn and doff the brace independently.  Occupational Therapy  assisted her with ADLs.  She was independent with these at the time of  discharge.  Physical Therapy did assist her with ambulation and gait  training.  Initially, she utilized a walker.  She was able to ambulate  greater than 100 feet prior to discharge.  Hemoglobin and hematocrit on  admission were 12.4 and 38.1.  At discharge, hemoglobin and hematocrit  9.3 and 28.8 and felt to be stable.  The patient did not receive a blood  transfusion during the hospital stay.  Chemistry studies on admission  with potassium 5.2 and glucose 109.  Potassium normalized during  hospital stay.  One episode of hyponatremia at 134.  Calcium at 7.6.  Urinalysis on admission showed 7-10 wbc's per high-power field, many  bacteria, and hyaline casts.   PLAN:  The patient was discharged to her home.  She did have assistance  by her husband and family.  Arrangements were made for home health  physical therapy and durable medical equipment.  She was instructed to  wear her brace at all times except for when she was sleeping.  She will  be allowed to shower if there is no wound drainage and if there is no  drainage from the blisters of her buttocks.  They were taught dressing  changes and  given supplies to do so at home.  She will avoid bending,  lifting, or twisting.  She will follow up with Dr. Louanne Skye 2 weeks from  surgery.  Remain on diabetic diet.   MEDICATIONS AT DISCHARGE:  The patient was instructed to continue on her  home medications as taken prior to admission and was given medication  reconciliation form with these instructions.  Prescriptions given  include:  1. Urecholine 25 mg 1 p.o. t.i.d.  2. Percocet 5/325 one  every 4-6 hours as needed for breakthrough pain.  3. Robaxin 500 mg 1 every 8 hours as needed for spasm.  4. It is of note that the patient was on OxyContin 30 mg b.i.d. on      admission and will remain on this at discharge for pain control as      well.   She was advised to call the office should she have questions or concerns  prior to return office visit.   CONDITION ON DISCHARGE:  Stable.      Epimenio Foot, P.A.      Jessy Oto, M.D.  Electronically Signed    SMV/MEDQ  D:  09/10/2008  T:  09/10/2008  Job:  [id number]

## 2010-07-26 ENCOUNTER — Encounter: Payer: Self-pay | Admitting: *Deleted

## 2010-08-09 ENCOUNTER — Encounter (INDEPENDENT_AMBULATORY_CARE_PROVIDER_SITE_OTHER): Payer: Self-pay | Admitting: *Deleted

## 2010-08-10 ENCOUNTER — Encounter: Payer: Self-pay | Admitting: Cardiology

## 2010-11-11 ENCOUNTER — Encounter: Payer: Self-pay | Admitting: *Deleted

## 2010-11-11 ENCOUNTER — Emergency Department (HOSPITAL_COMMUNITY)
Admission: EM | Admit: 2010-11-11 | Discharge: 2010-11-12 | Disposition: A | Discharge status: Home / Self Care | Payer: Medicare Other | Attending: Emergency Medicine | Admitting: Emergency Medicine

## 2010-11-11 DIAGNOSIS — E119 Type 2 diabetes mellitus without complications: Secondary | ICD-10-CM | POA: Insufficient documentation

## 2010-11-11 DIAGNOSIS — T63461A Toxic effect of venom of wasps, accidental (unintentional), initial encounter: Secondary | ICD-10-CM | POA: Insufficient documentation

## 2010-11-11 DIAGNOSIS — I10 Essential (primary) hypertension: Secondary | ICD-10-CM | POA: Insufficient documentation

## 2010-11-11 DIAGNOSIS — T6391XA Toxic effect of contact with unspecified venomous animal, accidental (unintentional), initial encounter: Secondary | ICD-10-CM | POA: Insufficient documentation

## 2010-11-11 DIAGNOSIS — T7840XA Allergy, unspecified, initial encounter: Secondary | ICD-10-CM

## 2010-11-11 HISTORY — DX: Essential (primary) hypertension: I10

## 2010-11-11 MED ORDER — PREDNISONE 50 MG PO TABS: 50.0000 mg | ORAL_TABLET | Freq: Every day | ORAL | Status: AC

## 2010-11-11 MED ORDER — SODIUM CHLORIDE 0.9 % IV BOLUS (SEPSIS)
1000.0000 mL | Freq: Once | INTRAVENOUS | Status: AC
  Administered 2010-11-11: 1000 mL via INTRAVENOUS

## 2010-11-11 MED ORDER — DIPHENHYDRAMINE HCL 50 MG/ML IJ SOLN
25.0000 mg | Freq: Once | INTRAMUSCULAR | Status: AC
  Administered 2010-11-11: 25 mg via INTRAVENOUS
  Filled 2010-11-11: qty 1

## 2010-11-11 MED ORDER — HYDROMORPHONE HCL 1 MG/ML IJ SOLN
1.0000 mg | Freq: Once | INTRAMUSCULAR | Status: AC
  Administered 2010-11-11: 1 mg via INTRAVENOUS
  Filled 2010-11-11: qty 1

## 2010-11-11 MED ORDER — METHYLPREDNISOLONE SODIUM SUCC 125 MG IJ SOLR
125.0000 mg | Freq: Once | INTRAMUSCULAR | Status: AC
  Administered 2010-11-11: 125 mg via INTRAVENOUS
  Filled 2010-11-11: qty 2

## 2010-11-11 MED ORDER — KETOROLAC TROMETHAMINE 30 MG/ML IJ SOLN
30.0000 mg | Freq: Once | INTRAMUSCULAR | Status: AC
  Administered 2010-11-11: 30 mg via INTRAVENOUS
  Filled 2010-11-11: qty 1

## 2010-11-11 MED ORDER — HYDROCODONE-ACETAMINOPHEN 5-325 MG PO TABS: 1.0000 | ORAL_TABLET | ORAL | Status: AC | PRN

## 2010-11-11 MED ORDER — FAMOTIDINE IN NACL 20-0.9 MG/50ML-% IV SOLN
20.0000 mg | Freq: Once | INTRAVENOUS | Status: AC
  Administered 2010-11-11: 20 mg via INTRAVENOUS
  Filled 2010-11-11: qty 50

## 2010-11-11 NOTE — ED Notes (Addendum)
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

## 2010-11-11 NOTE — ED Notes (Signed)
Multiple bee stings

## 2010-11-11 NOTE — ED Notes (Signed)
Pt c/o continues to have pain, epd notified additional orders given

## 2010-11-11 NOTE — ED Notes (Signed)
Pt states that the pain has not changed since the pain medication, edp notified, additional orders given

## 2010-11-11 NOTE — ED Notes (Signed)
Pt stating pain is better rating 5/10 no stated needs no noted distress

## 2010-11-11 NOTE — ED Provider Notes (Signed)
Scribed for No att. providers found, the patient was seen in room APAH2/AP[facility]2 . This chart was scribed by Glory Buff. This patient's care was started at 8:13 PM.   CSN: [id number] Arrival date & time: 11/11/2010  8:04 PM   Chief Complaint  Patient presents with  . Allergic Reaction     (Include location/radiation/quality/duration/timing/severity/associated sxs/prior treatment) HPI [name] is a 77 y.o. female who presents to the Emergency Department complaining of allergic rxn. Pt reports she had multiple bee stings to her right hand, right thigh, right face, right chest, left tibia~1 hour ago. Pt c/o associated welts, hives, and itching. 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 Pt used an epi pen around 7:25. Pt denies respiratory distress.    Received Epi pen 7:25. PCP Dr. Luan Pulling  HPI ELEMENTS:  Location: Skin  Onset: 1 hour ago  Timing: constant    Modifying factors: improvement with Epi pen  Context: as above  Associated symptoms: welts, hives, and itching   Past Medical History  Diagnosis Date  . Hypertension   . Diabetes mellitus      Past Surgical History  Procedure Date  . Back surgery   . Cholecystectomy   . Abdominal hysterectomy     History reviewed. No pertinent family history.  History  Substance Use Topics  . Smoking status: Not on file  . Smokeless tobacco: Not on file  . Alcohol Use: No  accompanied by daughter   Review of Systems 10 Systems reviewed and are negative for acute change except as noted in the HPI.  Allergies  Demerol  Home Medications  No current outpatient prescriptions on file.  Physical Exam    BP 78/56  Pulse 114  Temp 98.5 F (36.9 C)  Resp 20  Ht 5\' 2"  (1.575 m)  Wt 175 lb (79.379 kg)  BMI 32.01 kg/m2  SpO2 96%  Physical Exam  Nursing note and vitals reviewed. Constitutional: She is oriented to person, place, and time. She appears well-developed and well-nourished.       Appears slightly  flushed  HENT:  Head: Normocephalic and atraumatic.  Eyes: Conjunctivae and EOM are normal.  Neck: Normal range of motion. Neck supple.  Cardiovascular: Regular rhythm and normal heart sounds.        tachycardic  Pulmonary/Chest: Effort normal and breath sounds normal. No respiratory distress.  Abdominal: Soft. Bowel sounds are normal.  Musculoskeletal: Normal range of motion.  Neurological: She is alert and oriented to person, place, and time.  Skin: Skin is warm and dry.       Minimal erythema and swelling at bee sting locations: right hand, right thigh, right face, right chest, left tibia  Psychiatric: She has a normal mood and affect.   CRITICAL CARE Performed by: Nat Christen Authorized by: Nat Christen Total critical care time: 30 minutes Critical care was time spent personally by me on the following activities: evaluation of patient's response to treatment, obtaining history from patient or surrogate, ordering and review of laboratory studies, pulse oximetry, development of treatment plan with patient or surrogate, examination of patient, ordering and performing treatments and interventions and re-evaluation of patient's condition.    OTHER DATA REVIEWED: Nursing notes, vital signs, and past medical records reviewed.  DIAGNOSTIC STUDIES: Oxygen Saturation is 96% on room air, normal by my interpretation.    ED COURSE / COORDINATION OF CARE: 20:13- EDP at PT bedside. Discussed PE findings with PT: No respiratory distress. Discussed treatment plan with PT,  and ordered the following:  Medications  sodium chloride 0.9 % bolus 1,000 mL (1000 mL Intravenous Given 11/11/10 2033)  methylPREDNISolone sodium succinate (SOLU-MEDROL) 125 MG injection 125 mg (125 mg Intravenous Given 11/11/10 2030)  diphenhydrAMINE (BENADRYL) injection 25 mg (25 mg Intravenous Given 11/11/10 2030)  famotidine (PEPCID) IVPB 20 mg (20 mg Intravenous Given 11/11/10 2031)  ketorolac (TORADOL) 30 MG/ML injection  30 mg (30 mg Intravenous Given 11/11/10 2056)   21:37 PT re-check. Pt reports no pain improvement. EDP ordered:  Medications  HYDROmorphone (DILAUDID) injection 1 mg (not administered)   MDM: Patient presented to the ED with a profound allergic reaction secondary to Hymenoptera.  EpiPen administered by the patient to self. IV augmented with steroids, Benadryl, Pepcid. Pain management. Multiple rechecks in emergency department. Better at discharge.  IMPRESSION: Diagnoses that have been ruled out:  Diagnoses that are still under consideration:  Final diagnoses:    SCRIBE ATTESTATION: I personally performed the services described in this documentation, which was scribed in my presence. The recorded information has been reviewed and considered. Nat Christen, MD        Nat Christen, MD 11/11/10 [phone]

## 2010-11-12 NOTE — ED Notes (Signed)
Pt left the er stating no needs

## 2010-11-25 ENCOUNTER — Other Ambulatory Visit (HOSPITAL_COMMUNITY): Payer: Self-pay | Admitting: Pulmonary Disease

## 2010-11-25 DIAGNOSIS — Z139 Encounter for screening, unspecified: Secondary | ICD-10-CM

## 2010-12-09 LAB — TYPE AND SCREEN
ABO/RH(D): O NEG
Antibody Screen: NEGATIVE

## 2010-12-09 LAB — BASIC METABOLIC PANEL WITH GFR
BUN: 12
CO2: 28
Calcium: 8.2 — ABNORMAL LOW
Chloride: 102
Creatinine, Ser: 0.66
GFR calc Af Amer: >60
GFR calc non Af Amer: >60
Glucose, Bld: 150 — ABNORMAL HIGH
Potassium: 3.7
Sodium: 139

## 2010-12-09 LAB — BASIC METABOLIC PANEL
CO2: 28
Chloride: 103
Creatinine, Ser: 0.83
GFR calc Af Amer: >60
Sodium: 140

## 2010-12-09 LAB — URINALYSIS, ROUTINE W REFLEX MICROSCOPIC
Bilirubin Urine: NEGATIVE
Ketones, ur: NEGATIVE
Protein, ur: NEGATIVE
Specific Gravity, Urine: 1.011
Urobilinogen, UA: 0.2

## 2010-12-09 LAB — HEMOGLOBIN AND HEMATOCRIT, BLOOD
HCT: 27.2 — ABNORMAL LOW
HCT: 27.6 — ABNORMAL LOW
HCT: 31.2 — ABNORMAL LOW
Hemoglobin: 10.2 — ABNORMAL LOW
Hemoglobin: 9 — ABNORMAL LOW
Hemoglobin: 9.1 — ABNORMAL LOW

## 2010-12-09 LAB — POCT I-STAT 4, (NA,K, GLUC, HGB,HCT)
Glucose, Bld: 155 — ABNORMAL HIGH
HCT: 24 — ABNORMAL LOW
Hemoglobin: 8.2 — ABNORMAL LOW
Operator id: 114401
Potassium: 4.2
Sodium: 134 — ABNORMAL LOW

## 2010-12-09 LAB — HEMOGLOBIN A1C
Hgb A1c MFr Bld: 6.8 — ABNORMAL HIGH
Mean Plasma Glucose: 165

## 2010-12-09 LAB — URINE CULTURE

## 2010-12-09 LAB — CBC
Hemoglobin: 9.8 — ABNORMAL LOW
MCHC: 33.2
MCV: 85.2
RBC: 3.46 — ABNORMAL LOW

## 2010-12-09 LAB — URINE MICROSCOPIC-ADD ON

## 2011-01-09 ENCOUNTER — Ambulatory Visit (HOSPITAL_COMMUNITY)
Admission: RE | Admit: 2011-01-09 | Discharge: 2011-01-09 | Disposition: A | Discharge status: Home / Self Care | Payer: PRIVATE HEALTH INSURANCE | Source: Ambulatory Visit | Attending: Pulmonary Disease | Admitting: Pulmonary Disease

## 2011-01-09 DIAGNOSIS — Z139 Encounter for screening, unspecified: Secondary | ICD-10-CM

## 2011-01-09 DIAGNOSIS — Z1231 Encounter for screening mammogram for malignant neoplasm of breast: Secondary | ICD-10-CM | POA: Insufficient documentation

## 2011-01-16 ENCOUNTER — Ambulatory Visit: Payer: Self-pay | Admitting: Physical Medicine & Rehabilitation

## 2011-03-23 ENCOUNTER — Ambulatory Visit (INDEPENDENT_AMBULATORY_CARE_PROVIDER_SITE_OTHER): Payer: Self-pay

## 2011-03-23 DIAGNOSIS — Z79899 Other long term (current) drug therapy: Secondary | ICD-10-CM

## 2011-03-23 NOTE — Progress Notes (Signed)
12 Lead EKG completed. Original signed by Dr.Ross and given to patient. Copy scanned into EPIC.

## 2011-04-07 ENCOUNTER — Other Ambulatory Visit (HOSPITAL_COMMUNITY): Payer: Self-pay | Admitting: Pulmonary Disease

## 2011-04-10 ENCOUNTER — Ambulatory Visit (HOSPITAL_COMMUNITY)
Admission: RE | Admit: 2011-04-10 | Discharge: 2011-04-10 | Disposition: A | Discharge status: Home / Self Care | Payer: Medicare Other | Source: Ambulatory Visit | Attending: Pulmonary Disease | Admitting: Pulmonary Disease

## 2011-04-10 DIAGNOSIS — N200 Calculus of kidney: Secondary | ICD-10-CM | POA: Insufficient documentation

## 2011-04-10 DIAGNOSIS — K5730 Diverticulosis of large intestine without perforation or abscess without bleeding: Secondary | ICD-10-CM | POA: Insufficient documentation

## 2011-04-10 DIAGNOSIS — R1909 Other intra-abdominal and pelvic swelling, mass and lump: Secondary | ICD-10-CM | POA: Insufficient documentation

## 2011-04-10 MED ORDER — IOHEXOL 300 MG/ML  SOLN
100.0000 mL | Freq: Once | INTRAMUSCULAR | Status: AC | PRN
  Administered 2011-04-10: 100 mL via INTRAVENOUS

## 2011-04-18 ENCOUNTER — Inpatient Hospital Stay (HOSPITAL_COMMUNITY)
Admission: EM | Admit: 2011-04-18 | Discharge: 2011-04-20 | DRG: 179 | Disposition: A | Discharge status: Home / Self Care | Payer: 59 | Attending: Pulmonary Disease | Admitting: Pulmonary Disease

## 2011-04-18 ENCOUNTER — Encounter (HOSPITAL_COMMUNITY): Payer: Self-pay | Admitting: *Deleted

## 2011-04-18 ENCOUNTER — Other Ambulatory Visit: Payer: Self-pay

## 2011-04-18 DIAGNOSIS — Z79899 Other long term (current) drug therapy: Secondary | ICD-10-CM

## 2011-04-18 DIAGNOSIS — Z794 Long term (current) use of insulin: Secondary | ICD-10-CM

## 2011-04-18 DIAGNOSIS — D649 Anemia, unspecified: Secondary | ICD-10-CM | POA: Diagnosis present

## 2011-04-18 DIAGNOSIS — I498 Other specified cardiac arrhythmias: Secondary | ICD-10-CM | POA: Diagnosis present

## 2011-04-18 DIAGNOSIS — R112 Nausea with vomiting, unspecified: Secondary | ICD-10-CM | POA: Diagnosis present

## 2011-04-18 DIAGNOSIS — R739 Hyperglycemia, unspecified: Secondary | ICD-10-CM

## 2011-04-18 DIAGNOSIS — E119 Type 2 diabetes mellitus without complications: Secondary | ICD-10-CM | POA: Diagnosis present

## 2011-04-18 DIAGNOSIS — K5289 Other specified noninfective gastroenteritis and colitis: Secondary | ICD-10-CM | POA: Diagnosis present

## 2011-04-18 DIAGNOSIS — J690 Pneumonitis due to inhalation of food and vomit: Principal | ICD-10-CM | POA: Diagnosis present

## 2011-04-18 DIAGNOSIS — I10 Essential (primary) hypertension: Secondary | ICD-10-CM | POA: Diagnosis present

## 2011-04-18 DIAGNOSIS — M129 Arthropathy, unspecified: Secondary | ICD-10-CM | POA: Diagnosis present

## 2011-04-18 DIAGNOSIS — Z7982 Long term (current) use of aspirin: Secondary | ICD-10-CM

## 2011-04-18 DIAGNOSIS — E860 Dehydration: Secondary | ICD-10-CM

## 2011-04-18 DIAGNOSIS — M1990 Unspecified osteoarthritis, unspecified site: Secondary | ICD-10-CM | POA: Diagnosis present

## 2011-04-18 DIAGNOSIS — Z85828 Personal history of other malignant neoplasm of skin: Secondary | ICD-10-CM

## 2011-04-18 DIAGNOSIS — J189 Pneumonia, unspecified organism: Secondary | ICD-10-CM | POA: Diagnosis present

## 2011-04-18 DIAGNOSIS — R1110 Vomiting, unspecified: Secondary | ICD-10-CM

## 2011-04-18 HISTORY — DX: Unspecified osteoarthritis, unspecified site: M19.90

## 2011-04-18 HISTORY — DX: Unspecified malignant neoplasm of skin, unspecified: C44.90

## 2011-04-18 LAB — ETHANOL: Alcohol, Ethyl (B): <11 mg/dL (ref 0–11)

## 2011-04-18 LAB — RAPID URINE DRUG SCREEN, HOSP PERFORMED
Amphetamines: NOT DETECTED
Cocaine: NOT DETECTED
Opiates: POSITIVE — AB
Tetrahydrocannabinol: NOT DETECTED

## 2011-04-18 LAB — URINALYSIS, ROUTINE W REFLEX MICROSCOPIC
Nitrite: NEGATIVE
Specific Gravity, Urine: >1.03 — ABNORMAL HIGH (ref 1.005–1.030)
Urobilinogen, UA: 0.2 mg/dL (ref 0.0–1.0)

## 2011-04-18 LAB — COMPREHENSIVE METABOLIC PANEL
BUN: 29 mg/dL — ABNORMAL HIGH (ref 6–23)
CO2: 22 mEq/L (ref 19–32)
Calcium: 9.2 mg/dL (ref 8.4–10.5)
Creatinine, Ser: 0.64 mg/dL (ref 0.50–1.10)
GFR calc Af Amer: >90 mL/min (ref >90)
GFR calc non Af Amer: 85 mL/min — ABNORMAL LOW (ref >90)
Glucose, Bld: 387 mg/dL — ABNORMAL HIGH (ref 70–99)

## 2011-04-18 LAB — DIFFERENTIAL
Eosinophils Relative: 0 % (ref 0–5)
Lymphocytes Relative: 2 % — ABNORMAL LOW (ref 12–46)
Lymphs Abs: 0.3 10*3/uL — ABNORMAL LOW (ref 0.7–4.0)
Monocytes Absolute: 0.2 10*3/uL (ref 0.1–1.0)

## 2011-04-18 LAB — CBC
HCT: 36.6 % (ref 36.0–46.0)
MCV: 83.6 fL (ref 78.0–100.0)
RBC: 4.38 MIL/uL (ref 3.87–5.11)
RDW: 15.3 % (ref 11.5–15.5)
WBC: 13 10*3/uL — ABNORMAL HIGH (ref 4.0–10.5)

## 2011-04-18 LAB — CARDIAC PANEL(CRET KIN+CKTOT+MB+TROPI)
CK, MB: 2.3 ng/mL (ref 0.3–4.0)
Total CK: 43 U/L (ref 7–177)

## 2011-04-18 LAB — URINE MICROSCOPIC-ADD ON

## 2011-04-18 LAB — LIPASE, BLOOD: Lipase: 13 U/L (ref 11–59)

## 2011-04-18 LAB — PROTIME-INR: INR: 1.09 (ref 0.00–1.49)

## 2011-04-18 MED ORDER — INSULIN REGULAR HUMAN 100 UNIT/ML IJ SOLN
10.0000 [IU] | Freq: Once | INTRAMUSCULAR | Status: DC
  Filled 2011-04-18: qty 0.1

## 2011-04-18 MED ORDER — ONDANSETRON HCL 4 MG/2ML IJ SOLN
4.0000 mg | Freq: Once | INTRAMUSCULAR | Status: AC
  Administered 2011-04-18: 4 mg via INTRAVENOUS
  Filled 2011-04-18: qty 2

## 2011-04-18 MED ORDER — SODIUM CHLORIDE 0.9 % IV BOLUS (SEPSIS)
500.0000 mL | Freq: Once | INTRAVENOUS | Status: AC
  Administered 2011-04-18: 23:00:00 via INTRAVENOUS

## 2011-04-18 MED ORDER — INSULIN ASPART 100 UNIT/ML ~~LOC~~ SOLN
10.0000 [IU] | Freq: Once | SUBCUTANEOUS | Status: AC
  Administered 2011-04-18: 10 [IU] via SUBCUTANEOUS

## 2011-04-18 NOTE — ED Notes (Signed)
Pt with radial pulse of 130.  Alert, oriented x 4; denies pain; answers questions appropriately.  Pt eating ice and tolerating w/o c/o nausea.

## 2011-04-18 NOTE — ED Provider Notes (Signed)
History   Scribed for Julianne Rice, MD, the patient was seen in room APA08/APA08 . This chart was scribed by Denice Bors.   CSN: [id number]  Arrival date & time 04/18/11  2048   First MD Initiated Contact with Patient 04/18/11 2124      Chief Complaint  Patient presents with  . Emesis    (Consider location/radiation/quality/duration/timing/severity/associated sxs/prior treatment) HPI Comments: Pt reports x8 episodes of vomiting since earlier today and x2 episodes of diarrhea today.  Pt denies fever and chills. Pt denies hematuria, any urinary symptoms and bloody stools. Relative reports pt might have taken two times her regular night time medications.  Patient is a 78 y.o. female presenting with vomiting. The history is provided by the patient.  Emesis  This is a new problem. The current episode started 6 to 12 hours ago. The problem occurs 5 to 10 times per day (8x episodes of vomiting). The problem has not changed since onset.The emesis has an appearance of stomach contents. There has been no fever. Pertinent negatives include no abdominal pain, no cough, no diarrhea, no fever and no headaches.    Past Medical History  Diagnosis Date  . Hypertension   . Diabetes mellitus     Past Surgical History  Procedure Date  . Back surgery   . Cholecystectomy   . Abdominal hysterectomy     No family history on file.  History  Substance Use Topics  . Smoking status: Never Smoker   . Smokeless tobacco: Not on file  . Alcohol Use: No    OB History    Grav Para Term Preterm Abortions TAB SAB Ect Mult Living                  Review of Systems  Constitutional: Negative for fever and fatigue.  HENT: Negative for congestion, sinus pressure and ear discharge.   Eyes: Negative for discharge.  Respiratory: Negative for cough.   Cardiovascular: Negative for chest pain.  Gastrointestinal: Positive for nausea and vomiting. Negative for abdominal pain and diarrhea.    Genitourinary: Negative for frequency and hematuria.  Musculoskeletal: Negative for back pain.  Skin: Negative for rash.  Neurological: Negative for seizures and headaches.  Hematological: Negative.   Psychiatric/Behavioral: Negative for hallucinations.  All other systems reviewed and are negative.    Allergies  Demerol  Home Medications   Current Outpatient Rx  Name Route Sig Dispense Refill  . AMLODIPINE BESYLATE 10 MG PO TABS Oral Take 10 mg by mouth every morning.      . ASPIRIN EC 81 MG PO TBEC Oral Take 81 mg by mouth every morning.      [name] Kitchen BENAZEPRIL HCL 20 MG PO TABS Oral Take 20 mg by mouth every morning.      [name] Kitchen CALCIUM CARBONATE-VITAMIN D 600-400 MG-UNIT PO TABS Oral Take 1 tablet by mouth 2 (two) times daily.      . CELECOXIB 200 MG PO CAPS Oral Take 200 mg by mouth every morning.      [name] Kitchen CLOBETASOL PROPIONATE 0.05 % EX OINT Topical Apply 1 application topically as needed.      [name] Kitchen GABAPENTIN 100 MG PO CAPS Oral Take 100 mg by mouth at bedtime.      . GLYBURIDE-METFORMIN 2.5-500 MG PO TABS Oral Take 2 tablets by mouth 2 (two) times daily.      [name] Kitchen HYDROCODONE-ACETAMINOPHEN 5-325 MG PO TABS Oral Take 1 tablet by mouth every 12 (twelve) hours as needed. For pain      .  INSULIN GLARGINE 100 UNIT/ML [place] SOLN Subcutaneous Inject 35 Units into the skin every morning.      [name] Kitchen METOPROLOL SUCCINATE ER 25 MG PO TB24 Oral Take 25 mg by mouth every morning.      [name] Kitchen OMEPRAZOLE 20 MG PO CPDR Oral Take 20 mg by mouth at bedtime.      [name] Kitchen PROBIOTIC FORMULA PO CAPS Oral Take 1 capsule by mouth every morning.      [name] Kitchen SITAGLIPTIN PHOSPHATE 100 MG PO TABS Oral Take 100 mg by mouth every morning.        Ht 5\' 2"  (1.575 m)  Wt 175 lb (79.379 kg)  BMI 32.01 kg/m2  Physical Exam  Nursing note and vitals reviewed. Constitutional: She is oriented to person, place, and time. She appears well-developed and well-nourished.  HENT:  Head: Normocephalic and atraumatic.  Mouth/Throat: No oropharyngeal  exudate.  Eyes: Conjunctivae and EOM are normal. No scleral icterus.  Neck: Neck supple. No thyromegaly present.  Cardiovascular: Regular rhythm.  Tachycardia present.  Exam reveals no gallop and no friction rub.   No murmur heard. Pulmonary/Chest: Effort normal. No stridor. She has no wheezes. She has no rales. She exhibits no tenderness.  Abdominal: Soft. She exhibits no distension. There is no tenderness. There is no rebound.       Increased bowel sounds   Musculoskeletal: Normal range of motion. She exhibits no edema.  Lymphadenopathy:    She has no cervical adenopathy.  Neurological: She is alert and oriented to person, place, and time. No cranial nerve deficit. Coordination normal.       5/5 motor intact  Cranial nerves II-XII intact   Skin: No rash noted. No erythema.  Psychiatric: She has a normal mood and affect. Her behavior is normal.    ED Course  Procedures (including critical care time)  Results for orders placed during the hospital encounter of 04/18/11  GLUCOSE, CAPILLARY      Component Value Range   Glucose-Capillary 365 (*) 70 - 99 (mg/dL)  CBC      Component Value Range   WBC 13.0 (*) 4.0 - 10.5 (K/uL)   RBC 4.38  3.87 - 5.11 (MIL/uL)   Hemoglobin 11.9 (*) 12.0 - 15.0 (g/dL)   HCT 36.6  36.0 - 46.0 (%)   MCV 83.6  78.0 - 100.0 (fL)   MCH 27.2  26.0 - 34.0 (pg)   MCHC 32.5  30.0 - 36.0 (g/dL)   RDW 15.3  11.5 - 15.5 (%)   Platelets 230  150 - 400 (K/uL)  DIFFERENTIAL      Component Value Range   Neutrophils Relative 96 (*) 43 - 77 (%)   Neutro Abs 12.5 (*) 1.7 - 7.7 (K/uL)   Lymphocytes Relative 2 (*) 12 - 46 (%)   Lymphs Abs 0.3 (*) 0.7 - 4.0 (K/uL)   Monocytes Relative 2 (*) 3 - 12 (%)   Monocytes Absolute 0.2  0.1 - 1.0 (K/uL)   Eosinophils Relative 0  0 - 5 (%)   Eosinophils Absolute 0.0  0.0 - 0.7 (K/uL)   Basophils Relative 0  0 - 1 (%)   Basophils Absolute 0.0  0.0 - 0.1 (K/uL)  URINALYSIS, ROUTINE W REFLEX MICROSCOPIC      Component Value  Range   Color, Urine YELLOW  YELLOW    APPearance CLEAR  CLEAR    Specific Gravity, Urine >1.030 (*) 1.005 - 1.030    pH 5.5  5.0 - 8.0    Glucose,  UA 500 (*) NEGATIVE (mg/dL)   Hgb urine dipstick NEGATIVE  NEGATIVE    Bilirubin Urine NEGATIVE  NEGATIVE    Ketones, ur 15 (*) NEGATIVE (mg/dL)   Protein, ur 100 (*) NEGATIVE (mg/dL)   Urobilinogen, UA 0.2  0.0 - 1.0 (mg/dL)   Nitrite NEGATIVE  NEGATIVE    Leukocytes, UA NEGATIVE  NEGATIVE   PROTIME-INR      Component Value Range   Prothrombin Time 14.3  11.6 - 15.2 (seconds)   INR 1.09  0.00 - 1.49   APTT      Component Value Range   aPTT 25  24 - 37 (seconds)  URINE RAPID DRUG SCREEN (HOSP PERFORMED)      Component Value Range   Opiates POSITIVE (*) NONE DETECTED    Cocaine NONE DETECTED  NONE DETECTED    Benzodiazepines NONE DETECTED  NONE DETECTED    Amphetamines NONE DETECTED  NONE DETECTED    Tetrahydrocannabinol NONE DETECTED  NONE DETECTED    Barbiturates NONE DETECTED  NONE DETECTED   URINE MICROSCOPIC-ADD ON      Component Value Range   Squamous Epithelial / LPF FEW (*) RARE    WBC, UA 0-2  <3 (WBC/hpf)   Bacteria, UA FEW (*) RARE    Casts HYALINE CASTS (*) NEGATIVE     No results found.   No diagnosis found.  Date: 04/19/2011  Rate: 135  Rhythm: sinus tachycardia  QRS Axis: normal  Intervals: normal  ST/T Wave abnormalities: nonspecific ST changes  Conduction Disutrbances:none  Narrative Interpretation:   Old EKG Reviewed: none available     MDM  [facility] to see in ED after CXR performed and will admit for persistent tachy, dehydration.       I personally performed the services described in this documentation, which was scribed in my presence. The recorded information has been reviewed and considered.    Julianne Rice, MD 04/19/11 [phone]

## 2011-04-18 NOTE — ED Notes (Signed)
Vomiting all day,   Thinks she took her night meds for Tuesday and Wednesday.

## 2011-04-19 ENCOUNTER — Emergency Department (HOSPITAL_COMMUNITY): Payer: 59

## 2011-04-19 ENCOUNTER — Encounter (HOSPITAL_COMMUNITY): Payer: Self-pay | Admitting: Internal Medicine

## 2011-04-19 ENCOUNTER — Other Ambulatory Visit: Payer: Self-pay

## 2011-04-19 DIAGNOSIS — J189 Pneumonia, unspecified organism: Secondary | ICD-10-CM | POA: Diagnosis present

## 2011-04-19 DIAGNOSIS — I10 Essential (primary) hypertension: Secondary | ICD-10-CM | POA: Diagnosis present

## 2011-04-19 DIAGNOSIS — R112 Nausea with vomiting, unspecified: Secondary | ICD-10-CM | POA: Diagnosis present

## 2011-04-19 DIAGNOSIS — E119 Type 2 diabetes mellitus without complications: Secondary | ICD-10-CM | POA: Diagnosis present

## 2011-04-19 DIAGNOSIS — M1990 Unspecified osteoarthritis, unspecified site: Secondary | ICD-10-CM | POA: Diagnosis present

## 2011-04-19 LAB — GLUCOSE, CAPILLARY
Glucose-Capillary: 47 mg/dL — ABNORMAL LOW (ref 70–99)
Glucose-Capillary: 84 mg/dL (ref 70–99)
Glucose-Capillary: 75 mg/dL (ref 70–99)
Glucose-Capillary: 98 mg/dL (ref 70–99)

## 2011-04-19 LAB — COMPREHENSIVE METABOLIC PANEL
BUN: 30 mg/dL — ABNORMAL HIGH (ref 6–23)
Calcium: 8.8 mg/dL (ref 8.4–10.5)
GFR calc Af Amer: >90 mL/min (ref >90)
Glucose, Bld: 143 mg/dL — ABNORMAL HIGH (ref 70–99)
Sodium: 136 mEq/L (ref 135–145)
Total Protein: 6.6 g/dL (ref 6.0–8.3)

## 2011-04-19 LAB — HEMOGLOBIN A1C
Hgb A1c MFr Bld: 8 % — ABNORMAL HIGH (ref <5.7)
Mean Plasma Glucose: 183 mg/dL — ABNORMAL HIGH (ref <117)

## 2011-04-19 LAB — CARDIAC PANEL(CRET KIN+CKTOT+MB+TROPI)
CK, MB: 2 ng/mL (ref 0.3–4.0)
CK, MB: 1.9 ng/mL (ref 0.3–4.0)
CK, MB: 2.4 ng/mL (ref 0.3–4.0)
Relative Index: INVALID (ref 0.0–2.5)
Total CK: 37 U/L (ref 7–177)
Total CK: 56 U/L (ref 7–177)
Troponin I: <0.3 ng/mL
Troponin I: <0.3 ng/mL (ref <0.30)

## 2011-04-19 LAB — CBC
HCT: 33.2 % — ABNORMAL LOW (ref 36.0–46.0)
Hemoglobin: 10.6 g/dL — ABNORMAL LOW (ref 12.0–15.0)
MCH: 26.8 pg (ref 26.0–34.0)
MCHC: 31.9 g/dL (ref 30.0–36.0)
MCV: 84.1 fL (ref 78.0–100.0)
Platelets: 220 K/uL (ref 150–400)
RBC: 3.95 MIL/uL (ref 3.87–5.11)
RDW: 15.4 % (ref 11.5–15.5)
WBC: 10.1 K/uL (ref 4.0–10.5)

## 2011-04-19 MED ORDER — MOXIFLOXACIN HCL IN NACL 400 MG/250ML IV SOLN
400.0000 mg | INTRAVENOUS | Status: DC
  Administered 2011-04-19 – 2011-04-20 (×2): 400 mg via INTRAVENOUS
  Filled 2011-04-19 (×2): qty 250

## 2011-04-19 MED ORDER — LEVALBUTEROL HCL 0.63 MG/3ML IN NEBU: 0.6300 mg | INHALATION_SOLUTION | Freq: Four times a day (QID) | RESPIRATORY_TRACT | Status: DC | PRN

## 2011-04-19 MED ORDER — ONDANSETRON HCL 4 MG PO TABS: 4.0000 mg | ORAL_TABLET | Freq: Four times a day (QID) | ORAL | Status: DC | PRN

## 2011-04-19 MED ORDER — DULOXETINE HCL 60 MG PO CPEP
60.0000 mg | ORAL_CAPSULE | ORAL | Status: DC
  Administered 2011-04-19 – 2011-04-20 (×2): 60 mg via ORAL
  Filled 2011-04-19 (×2): qty 1

## 2011-04-19 MED ORDER — ONDANSETRON HCL 4 MG/2ML IJ SOLN: 4.0000 mg | Freq: Four times a day (QID) | INTRAMUSCULAR | Status: DC | PRN

## 2011-04-19 MED ORDER — GABAPENTIN 100 MG PO CAPS
100.0000 mg | ORAL_CAPSULE | Freq: Every day | ORAL | Status: DC
  Administered 2011-04-19: 100 mg via ORAL
  Filled 2011-04-19: qty 1

## 2011-04-19 MED ORDER — INSULIN ASPART 100 UNIT/ML ~~LOC~~ SOLN: 0.0000 [IU] | Freq: Three times a day (TID) | SUBCUTANEOUS | Status: DC

## 2011-04-19 MED ORDER — ACETAMINOPHEN 325 MG PO TABS: 650.0000 mg | ORAL_TABLET | Freq: Four times a day (QID) | ORAL | Status: DC | PRN

## 2011-04-19 MED ORDER — METOPROLOL SUCCINATE ER 25 MG PO TB24
25.0000 mg | ORAL_TABLET | Freq: Every morning | ORAL | Status: DC
  Administered 2011-04-19 – 2011-04-20 (×2): 25 mg via ORAL
  Filled 2011-04-19 (×2): qty 1

## 2011-04-19 MED ORDER — SODIUM CHLORIDE 0.9 % IV BOLUS (SEPSIS)
500.0000 mL | Freq: Once | INTRAVENOUS | Status: AC
  Administered 2011-04-19: 500 mL via INTRAVENOUS

## 2011-04-19 MED ORDER — PANTOPRAZOLE SODIUM 40 MG PO TBEC
40.0000 mg | DELAYED_RELEASE_TABLET | Freq: Every day | ORAL | Status: DC
  Administered 2011-04-19: 40 mg via ORAL
  Filled 2011-04-19: qty 1

## 2011-04-19 MED ORDER — DIPHENOXYLATE-ATROPINE 2.5-0.025 MG PO TABS
1.0000 | ORAL_TABLET | Freq: Four times a day (QID) | ORAL | Status: DC | PRN
  Administered 2011-04-19: 1 via ORAL
  Filled 2011-04-19: qty 1

## 2011-04-19 MED ORDER — INSULIN GLARGINE 100 UNIT/ML ~~LOC~~ SOLN: 25.0000 [IU] | Freq: Every day | SUBCUTANEOUS | Status: DC

## 2011-04-19 MED ORDER — INSULIN ASPART 100 UNIT/ML ~~LOC~~ SOLN: 0.0000 [IU] | Freq: Every day | SUBCUTANEOUS | Status: DC

## 2011-04-19 MED ORDER — ASPIRIN EC 81 MG PO TBEC
81.0000 mg | DELAYED_RELEASE_TABLET | Freq: Every morning | ORAL | Status: DC
  Administered 2011-04-19 – 2011-04-20 (×2): 81 mg via ORAL
  Filled 2011-04-19 (×2): qty 1

## 2011-04-19 MED ORDER — HYDROCODONE-ACETAMINOPHEN 5-325 MG PO TABS
1.0000 | ORAL_TABLET | ORAL | Status: DC | PRN
  Administered 2011-04-19: 2 via ORAL
  Filled 2011-04-19: qty 2

## 2011-04-19 MED ORDER — SODIUM CHLORIDE 0.9 % IJ SOLN
3.0000 mL | Freq: Two times a day (BID) | INTRAMUSCULAR | Status: DC
  Administered 2011-04-19: 3 mL via INTRAVENOUS
  Filled 2011-04-19: qty 3

## 2011-04-19 MED ORDER — ACETAMINOPHEN 650 MG RE SUPP: 650.0000 mg | Freq: Four times a day (QID) | RECTAL | Status: DC | PRN

## 2011-04-19 MED ORDER — BENAZEPRIL HCL 10 MG PO TABS
20.0000 mg | ORAL_TABLET | Freq: Every morning | ORAL | Status: DC
  Administered 2011-04-19 – 2011-04-20 (×2): 20 mg via ORAL
  Filled 2011-04-19 (×2): qty 2

## 2011-04-19 MED ORDER — SODIUM CHLORIDE 0.9 % IV SOLN
INTRAVENOUS | Status: AC
  Administered 2011-04-19 (×2): via INTRAVENOUS

## 2011-04-19 MED ORDER — MOXIFLOXACIN HCL IN NACL 400 MG/250ML IV SOLN
INTRAVENOUS | Status: AC
  Filled 2011-04-19: qty 250

## 2011-04-19 MED ORDER — AMLODIPINE BESYLATE 5 MG PO TABS
10.0000 mg | ORAL_TABLET | Freq: Every morning | ORAL | Status: DC
  Administered 2011-04-19 – 2011-04-20 (×2): 10 mg via ORAL
  Filled 2011-04-19 (×2): qty 2

## 2011-04-19 NOTE — ED Notes (Signed)
Floor nurse requesting 45 minutes before taking report.

## 2011-04-19 NOTE — Progress Notes (Signed)
Patient with loose stools X 5 after start of shift. Paged M.D. Received new order for Lomotil, 1 tab q 6 hrs PRN. Will continue to monitor throughout shift for improvement.

## 2011-04-19 NOTE — H&P (Signed)
[name] is an 78 y.o. female.    PCP: Alonza Bogus, MD, MD   Chief Complaint: Nausea  HPI: This is a 78 year old, Caucasian female, with a past medical history of hypertension, diabetes, arthritis, who was in her usual state of health till 10 AM yesterday morning when she started having significant nausea. This was followed by onset of emesis. She threw up about 9 times. It is mostly yellow bilious material. Had some left-sided abdominal pain. Had 2 normal bowel movements, but denied any diarrhea. Has been having some cough, which is dry. Denies any chest pain or shortness of breath. No fever or chills. She is feeling somewhat better, since she's been in the hospital. Denies any urinary complaints.   Home Medications: Prior to Admission medications   Medication Sig Start Date End Date Taking? Authorizing Provider  amLODipine (NORVASC) 10 MG tablet Take 10 mg by mouth every morning.     Yes Historical Provider, MD  aspirin EC 81 MG tablet Take 81 mg by mouth every morning.     Yes Historical Provider, MD  benazepril (LOTENSIN) 20 MG tablet Take 20 mg by mouth every morning.     Yes Historical Provider, MD  Calcium Carbonate-Vitamin D (CALCIUM 600+D) 600-400 MG-UNIT per tablet Take 1 tablet by mouth 2 (two) times daily.     Yes Historical Provider, MD  celecoxib (CELEBREX) 200 MG capsule Take 200 mg by mouth every morning.     Yes Historical Provider, MD  DULoxetine (CYMBALTA) 60 MG capsule Take 60 mg by mouth every morning.   Yes Historical Provider, MD  gabapentin (NEURONTIN) 100 MG capsule Take 100 mg by mouth at bedtime.     Yes Historical Provider, MD  glyBURIDE-metformin (GLUCOVANCE) 2.5-500 MG per tablet Take 2 tablets by mouth 2 (two) times daily.     Yes Historical Provider, MD  HYDROcodone-acetaminophen (NORCO) 5-325 MG per tablet Take 1 tablet by mouth every 12 (twelve) hours as needed. For pain     Yes Historical Provider, MD  insulin glargine (LANTUS SOLOSTAR) 100  UNIT/ML injection Inject 40 Units into the skin every morning.    Yes Historical Provider, MD  metoprolol succinate (TOPROL-XL) 25 MG 24 hr tablet Take 25 mg by mouth every morning.     Yes Historical Provider, MD  omeprazole (PRILOSEC) 20 MG capsule Take 20 mg by mouth at bedtime.     Yes Historical Provider, MD  Probiotic Product (PROBIOTIC FORMULA) CAPS Take 1 capsule by mouth every morning.     Yes Historical Provider, MD  promethazine (PHENERGAN) 25 MG suppository Place 25 mg rectally every 6 (six) hours as needed. **Unwrap and insert one suppository every 6 to 8 hours as needed for nausea**   Yes Historical Provider, MD  sitaGLIPtin (JANUVIA) 100 MG tablet Take 100 mg by mouth every morning.     Yes Historical Provider, MD    Allergies:  Allergies  Allergen Reactions  . Demerol Nausea Only    Past Medical History: Past Medical History  Diagnosis Date  . Hypertension   . Diabetes mellitus   . Arthritis   . Skin cancer     Past Surgical History  Procedure Date  . Back surgery   . Cholecystectomy   . Abdominal hysterectomy   . Skin biopsy   . Shoulder surgery     Social History:  reports that she has never smoked. She does not have any smokeless tobacco history on file. She reports that she does not  drink alcohol or use illicit drugs.  Family History: There is history of, diabetes in the family.  Review of Systems - History obtained from the patient General ROS: negative Psychological ROS: negative Ophthalmic ROS: negative ENT ROS: negative Allergy and Immunology ROS: negative Hematological and Lymphatic ROS: negative Endocrine ROS: negative Respiratory ROS: as in hpi Cardiovascular ROS: as in hpi Gastrointestinal ROS: as in hpi Genito-Urinary ROS: negative Musculoskeletal ROS: positive for - joint pain Neurological ROS: negative Dermatological ROS: negative  Physical Examination Blood pressure 135/67, pulse 117, temperature 98.7 F (37.1 C), temperature  source Oral, resp. rate 18, height 5\' 2"  (1.575 m), weight 79.379 kg (175 lb), SpO2 93.00%.  General appearance: alert, cooperative, appears stated age and no distress Head: Normocephalic, without obvious abnormality, atraumatic Eyes: conjunctivae/corneas clear. PERRL, EOM's intact. Fundi benign. Throat: dry mm Neck: no adenopathy, no carotid bruit, no JVD, supple, symmetrical, trachea midline and thyroid not enlarged, symmetric, no tenderness/mass/nodules Back: symmetric, no curvature. ROM normal. No CVA tenderness. Resp: Crackles at the bases bilaterally. More so on the right side. No wheezing. Cardio: S1, S2 is tachycardic, but regular. No murmurs appreciated. No S3, S4. No rubs, or bruits. GI: soft, non-tender; bowel sounds normal; no masses,  no organomegaly Extremities: extremities normal, atraumatic, no cyanosis or edema Pulses: 2+ and symmetric Skin: Skin color, texture, turgor normal. No rashes or lesions Lymph nodes: Cervical, supraclavicular, and axillary nodes normal. Neurologic: Grossly normal  Laboratory Data: Results for orders placed during the hospital encounter of 04/18/11 (from the past 48 hour(s))  GLUCOSE, CAPILLARY     Status: Abnormal   Collection Time   04/18/11  9:01 PM      Component Value Range Comment   Glucose-Capillary 365 (*) 70 - 99 (mg/dL)   CBC     Status: Abnormal   Collection Time   04/18/11 10:16 PM      Component Value Range Comment   WBC 13.0 (*) 4.0 - 10.5 (K/uL)    RBC 4.38  3.87 - 5.11 (MIL/uL)    Hemoglobin 11.9 (*) 12.0 - 15.0 (g/dL)    HCT 36.6  36.0 - 46.0 (%)    MCV 83.6  78.0 - 100.0 (fL)    MCH 27.2  26.0 - 34.0 (pg)    MCHC 32.5  30.0 - 36.0 (g/dL)    RDW 15.3  11.5 - 15.5 (%)    Platelets 230  150 - 400 (K/uL)   DIFFERENTIAL     Status: Abnormal   Collection Time   04/18/11 10:16 PM      Component Value Range Comment   Neutrophils Relative 96 (*) 43 - 77 (%)    Neutro Abs 12.5 (*) 1.7 - 7.7 (K/uL)    Lymphocytes Relative 2 (*)  12 - 46 (%)    Lymphs Abs 0.3 (*) 0.7 - 4.0 (K/uL)    Monocytes Relative 2 (*) 3 - 12 (%)    Monocytes Absolute 0.2  0.1 - 1.0 (K/uL)    Eosinophils Relative 0  0 - 5 (%)    Eosinophils Absolute 0.0  0.0 - 0.7 (K/uL)    Basophils Relative 0  0 - 1 (%)    Basophils Absolute 0.0  0.0 - 0.1 (K/uL)   COMPREHENSIVE METABOLIC PANEL     Status: Abnormal   Collection Time   04/18/11 10:16 PM      Component Value Range Comment   Sodium 136  135 - 145 (mEq/L)    Potassium 3.9  3.5 -  5.1 (mEq/L)    Chloride 98  96 - 112 (mEq/L)    CO2 22  19 - 32 (mEq/L)    Glucose, Bld 387 (*) 70 - 99 (mg/dL)    BUN 29 (*) 6 - 23 (mg/dL)    Creatinine, Ser 0.64  0.50 - 1.10 (mg/dL)    Calcium 9.2  8.4 - 10.5 (mg/dL)    Total Protein 7.4  6.0 - 8.3 (g/dL)    Albumin 3.7  3.5 - 5.2 (g/dL)    AST 30  0 - 37 (U/L)    ALT 18  0 - 35 (U/L)    Alkaline Phosphatase 120 (*) 39 - 117 (U/L)    Total Bilirubin 0.5  0.3 - 1.2 (mg/dL)    GFR calc non Af Amer 85 (*) >90 (mL/min)    GFR calc Af Amer >90  >90 (mL/min)   CARDIAC PANEL(CRET KIN+CKTOT+MB+TROPI)     Status: Normal   Collection Time   04/18/11 10:16 PM      Component Value Range Comment   Total CK 43  7 - 177 (U/L)    CK, MB 2.3  0.3 - 4.0 (ng/mL)    Troponin I <0.30  <0.30 (ng/mL)    Relative Index RELATIVE INDEX IS INVALID  0.0 - 2.5    PROTIME-INR     Status: Normal   Collection Time   04/18/11 10:16 PM      Component Value Range Comment   Prothrombin Time 14.3  11.6 - 15.2 (seconds)    INR 1.09  0.00 - 1.49    APTT     Status: Normal   Collection Time   04/18/11 10:16 PM      Component Value Range Comment   aPTT 25  24 - 37 (seconds)   LIPASE, BLOOD     Status: Normal   Collection Time   04/18/11 10:16 PM      Component Value Range Comment   Lipase 13  11 - 59 (U/L)   ACETAMINOPHEN LEVEL     Status: Normal   Collection Time   04/18/11 10:16 PM      Component Value Range Comment   Acetaminophen (Tylenol), Serum <15.0  10 - 30 (ug/mL)     SALICYLATE LEVEL     Status: Abnormal   Collection Time   04/18/11 10:16 PM      Component Value Range Comment   Salicylate Lvl 123456 (*) 2.8 - 20.0 (mg/dL)   ETHANOL     Status: Normal   Collection Time   04/18/11 10:16 PM      Component Value Range Comment   Alcohol, Ethyl (B) <11  0 - 11 (mg/dL)   URINALYSIS, ROUTINE W REFLEX MICROSCOPIC     Status: Abnormal   Collection Time   04/18/11 10:19 PM      Component Value Range Comment   Color, Urine YELLOW  YELLOW     APPearance CLEAR  CLEAR     Specific Gravity, Urine >1.030 (*) 1.005 - 1.030     pH 5.5  5.0 - 8.0     Glucose, UA 500 (*) NEGATIVE (mg/dL)    Hgb urine dipstick NEGATIVE  NEGATIVE     Bilirubin Urine NEGATIVE  NEGATIVE     Ketones, ur 15 (*) NEGATIVE (mg/dL)    Protein, ur 100 (*) NEGATIVE (mg/dL)    Urobilinogen, UA 0.2  0.0 - 1.0 (mg/dL)    Nitrite NEGATIVE  NEGATIVE     Leukocytes, UA NEGATIVE  NEGATIVE    URINE RAPID DRUG SCREEN (HOSP PERFORMED)     Status: Abnormal   Collection Time   04/18/11 10:19 PM      Component Value Range Comment   Opiates POSITIVE (*) NONE DETECTED     Cocaine NONE DETECTED  NONE DETECTED     Benzodiazepines NONE DETECTED  NONE DETECTED     Amphetamines NONE DETECTED  NONE DETECTED     Tetrahydrocannabinol NONE DETECTED  NONE DETECTED     Barbiturates NONE DETECTED  NONE DETECTED    URINE MICROSCOPIC-ADD ON     Status: Abnormal   Collection Time   04/18/11 10:19 PM      Component Value Range Comment   Squamous Epithelial / LPF FEW (*) RARE     WBC, UA 0-2  <3 (WBC/hpf)    Bacteria, UA FEW (*) RARE     Casts HYALINE CASTS (*) NEGATIVE    GLUCOSE, CAPILLARY     Status: Abnormal   Collection Time   04/19/11 12:13 AM      Component Value Range Comment   Glucose-Capillary 285 (*) 70 - 99 (mg/dL)     Radiology Reports: Dg Chest 2 View  04/19/2011  *RADIOLOGY REPORT*  Clinical Data: Decreased O2 saturation; nausea and vomiting. History of diabetes and hypertension.  CHEST - 2 VIEW   Comparison: Chest radiograph performed 05/27/2008  Findings: The lungs are well-aerated.  There is suggestion of mild right lower lobe opacity, which may reflect mild pneumonia; minimal opacity on the lateral view appears more prominent than in 2010. There is no evidence of pleural effusion or pneumothorax.  The heart is normal in size; the mediastinal contour is within normal limits.  No acute osseous abnormalities are seen.  Lumbar spinal fusion hardware is partially imaged.  IMPRESSION: Suggestion of mild right lower lobe opacity, raising concern for mild pneumonia.  Original Report Authenticated By: Santa Lighter, M.D.    Electrocardiogram: EKG shows sinus tachycardia at and 35 beats per minute. Normal axis. Intervals appear to be in the normal range. Prominent Q waves in the inferior leads. No concerning ST changes or T-wave changes. Compared to previous EKGs the Q waves seem to be more pronounced.  Assessment/Plan  Principal Problem:  *CAP (community acquired pneumonia) Active Problems:  Nausea and vomiting  HTN (hypertension)  DM type 2 (diabetes mellitus, type 2)  Arthritis   #1 community-acquired pneumonia versus aspiration pneumonitis: It is unclear if the pneumonia was present even before the onset of her symptoms. She has vomited multiple times and so aspiration is a distinct possibility. In any case we will treat her with Avelox for now.  #2 nausea and vomiting: The etiology remains unclear. Her abdomen is completely benign. She had a recent CAT scan of her abdomen. This did not show any concerning findings. If her symptoms persist an acute abdominal series will be done. At this time we will give her symptomatic treatment and put her on PPI.  #3 sinus tachycardia: This is likely due to dehydration. Gentle IV hydration will be provided. Cardiac enzymes are negative. There is absolutely no suspicion for venous thromboembolism at this time.  #4 diabetes, type II. We will put her on a  sliding scale. We'll continue with her Lantus. HbA1c will be checked.  #5 history of hypertension, stable. Continue to monitor.   #6 she admitted that she may have taken more of her medications than she normally does. This was attributed to a mistake. We will monitor her  closely.  She does have mild anemia, which will be monitored closely.  Dr. Luan Pulling will assume care in the morning.  DVT, prophylaxis will be initiated.  [facility] Pager [phone]  04/19/2011, 2:03 AM

## 2011-04-19 NOTE — ED Notes (Signed)
Hospitalist in to examine.

## 2011-04-19 NOTE — Progress Notes (Signed)
Subjective:  she is overall better. She was admitted with nausea and vomiting and probably has a patch of pneumonia. Everything else is going okay. She did not have diarrhea associated with this. She was around her granddaughter who has been sick.  Objective: Vital signs in last 24 hours: Temp:  [98.4 F (36.9 C)-99.2 F (37.3 C)] 98.6 F (37 C) (02/20 0600) Pulse Rate:  [103-132] 115  (02/20 0600) Resp:  [18-20] 20  (02/20 0600) BP: (135-183)/(62-90) 138/62 mmHg (02/20 0600) SpO2:  [93 %-95 %] 95 % (02/20 0600) Weight:  [79.379 kg (175 lb)] 79.379 kg (175 lb) (02/20 0253) Weight change:  Last BM Date: 04/18/11  Intake/Output from previous day:    PHYSICAL EXAM General appearance: alert, cooperative and mild distress Resp: clear to auscultation bilaterally Cardio: regular rate and rhythm, S1, S2 normal, no murmur, click, rub or gallop GI: soft, non-tender; bowel sounds normal; no masses,  no organomegaly Extremities: extremities normal, atraumatic, no cyanosis or edema  Lab Results:    Basic Metabolic Panel:  Basename 04/19/11 0305 04/18/11 2216  NA 136 136  K 3.9 3.9  CL 101 98  CO2 24 22  GLUCOSE 143* 387*  BUN 30* 29*  CREATININE 0.71 0.64  CALCIUM 8.8 9.2  MG -- --  PHOS -- --   Liver Function Tests:  Basename 04/19/11 0305 04/18/11 2216  AST 22 30  ALT 15 18  ALKPHOS 99 120*  BILITOT 0.3 0.5  PROT 6.6 7.4  ALBUMIN 3.1* 3.7    Basename 04/18/11 2216  LIPASE 13  AMYLASE --   No results found for this basename: AMMONIA:2 in the last 72 hours CBC:  Basename 04/19/11 0305 04/18/11 2216  WBC 10.1 13.0*  NEUTROABS -- 12.5*  HGB 10.6* 11.9*  HCT 33.2* 36.6  MCV 84.1 83.6  PLT 220 230   Cardiac Enzymes:  Basename 04/19/11 0305 04/18/11 2216  CKTOTAL 37 43  CKMB 2.0 2.3  CKMBINDEX -- --  TROPONINI <0.30 <0.30   BNP: No results found for this basename: PROBNP:3 in the last 72 hours D-Dimer: No results found for this basename: DDIMER:2 in the  last 72 hours CBG:  Basename 04/19/11 0805 04/19/11 0736 04/19/11 0013 04/18/11 2101  GLUCAP 66* 47* 285* 365*   Hemoglobin A1C: No results found for this basename: HGBA1C in the last 72 hours Fasting Lipid Panel: No results found for this basename: CHOL,HDL,LDLCALC,TRIG,CHOLHDL,LDLDIRECT in the last 72 hours Thyroid Function Tests: No results found for this basename: TSH,T4TOTAL,FREET4,T3FREE,THYROIDAB in the last 72 hours Anemia Panel: No results found for this basename: VITAMINB12,FOLATE,FERRITIN,TIBC,IRON,RETICCTPCT in the last 72 hours Coagulation:  Basename 04/18/11 2216  LABPROT 14.3  INR 1.09   Urine Drug Screen: Drugs of Abuse     Component Value Date/Time   LABOPIA POSITIVE* 04/18/2011 2219   [place] DETECTED 04/18/2011 2219   LABBENZ NONE DETECTED 04/18/2011 2219   AMPHETMU NONE DETECTED 04/18/2011 2219   THCU NONE DETECTED 04/18/2011 2219   LABBARB NONE DETECTED 04/18/2011 2219    Alcohol Level:  Basename 04/18/11 2216  ETH <11   Urinalysis:  Basename 04/18/11 2219  COLORURINE YELLOW  LABSPEC >1.030*  PHURINE 5.5  GLUCOSEU 500*  HGBUR NEGATIVE  BILIRUBINUR NEGATIVE  KETONESUR 15*  PROTEINUR 100*  UROBILINOGEN 0.2  NITRITE NEGATIVE  LEUKOCYTESUR NEGATIVE   Misc. Labs:  ABGS No results found for this basename: PHART,PCO2,PO2ART,TCO2,HCO3 in the last 72 hours CULTURES No results found for this or any previous visit (from the past 240 hour(s)).  Studies/Results: Dg Chest 2 View  04/19/2011  *RADIOLOGY REPORT*  Clinical Data: Decreased O2 saturation; nausea and vomiting. History of diabetes and hypertension.  CHEST - 2 VIEW  Comparison: Chest radiograph performed 05/27/2008  Findings: The lungs are well-aerated.  There is suggestion of mild right lower lobe opacity, which may reflect mild pneumonia; minimal opacity on the lateral view appears more prominent than in 2010. There is no evidence of pleural effusion or pneumothorax.  The heart is normal  in size; the mediastinal contour is within normal limits.  No acute osseous abnormalities are seen.  Lumbar spinal fusion hardware is partially imaged.  IMPRESSION: Suggestion of mild right lower lobe opacity, raising concern for mild pneumonia.  Original Report Authenticated By: Santa Lighter, M.D.    Medications:  Prior to Admission:  Prescriptions prior to admission  Medication Sig Dispense Refill  . amLODipine (NORVASC) 10 MG tablet Take 10 mg by mouth every morning.        [name] Kitchen aspirin EC 81 MG tablet Take 81 mg by mouth every morning.        . benazepril (LOTENSIN) 20 MG tablet Take 20 mg by mouth every morning.        . Calcium Carbonate-Vitamin D (CALCIUM 600+D) 600-400 MG-UNIT per tablet Take 1 tablet by mouth 2 (two) times daily.        . celecoxib (CELEBREX) 200 MG capsule Take 200 mg by mouth every morning.        . DULoxetine (CYMBALTA) 60 MG capsule Take 60 mg by mouth every morning.      . gabapentin (NEURONTIN) 100 MG capsule Take 100 mg by mouth at bedtime.        [name] Kitchen glyBURIDE-metformin (GLUCOVANCE) 2.5-500 MG per tablet Take 2 tablets by mouth 2 (two) times daily.        [name] Kitchen HYDROcodone-acetaminophen (NORCO) 5-325 MG per tablet Take 1 tablet by mouth every 12 (twelve) hours as needed. For pain        . insulin glargine (LANTUS SOLOSTAR) 100 UNIT/ML injection Inject 40 Units into the skin every morning.       . metoprolol succinate (TOPROL-XL) 25 MG 24 hr tablet Take 25 mg by mouth every morning.        [name] Kitchen omeprazole (PRILOSEC) 20 MG capsule Take 20 mg by mouth at bedtime.        . Probiotic Product (PROBIOTIC FORMULA) CAPS Take 1 capsule by mouth every morning.        . promethazine (PHENERGAN) 25 MG suppository Place 25 mg rectally every 6 (six) hours as needed. **Unwrap and insert one suppository every 6 to 8 hours as needed for nausea**      . sitaGLIPtin (JANUVIA) 100 MG tablet Take 100 mg by mouth every morning.         Scheduled:   . amLODipine  10 mg Oral q morning - 10a    . aspirin EC  81 mg Oral q morning - 10a  . benazepril  20 mg Oral q morning - 10a  . DULoxetine  60 mg Oral [id number]  . gabapentin  100 mg Oral QHS  . insulin aspart  0-15 Units Subcutaneous TID WC  . insulin aspart  0-5 Units Subcutaneous QHS  . insulin aspart  10 Units Subcutaneous Once  . metoprolol succinate  25 mg Oral q morning - 10a  . moxifloxacin  400 mg Intravenous Q24H  . ondansetron  4 mg Intravenous Once  . pantoprazole  40 mg Oral  Q1200  . sodium chloride  500 mL Intravenous Once  . sodium chloride  500 mL Intravenous Once  . sodium chloride  3 mL Intravenous Q12H  . DISCONTD: insulin glargine  25 Units Subcutaneous Q breakfast  . DISCONTD: insulin regular  10 Units Subcutaneous Once   Continuous:   . sodium chloride 100 mL/hr at 04/19/11 0315   [id number], acetaminophen, HYDROcodone-acetaminophen, levalbuterol, ondansetron (ZOFRAN) IV, ondansetron  Assesment: She has pneumonia. She probably had a viral gastroenteritis. She very well may have aspirated. She was somewhat confused last night but is better now Principal Problem:  *CAP (community acquired pneumonia) Active Problems:  Nausea and vomiting  HTN (hypertension)  DM type 2 (diabetes mellitus, type 2)  Arthritis    Plan: I don't plan to change any of her treatments she will continue with her IV fluids. Blood sugar is low this morning so she's going to need to have a lower dose of Lantus I'm asked to going to simply discontinue it for now until I'm sure she can eat. I did not change anything else.    LOS: 1 day   Ava Deguire L 04/19/2011, 8:39 AM

## 2011-04-20 ENCOUNTER — Other Ambulatory Visit: Payer: Self-pay | Admitting: Neurosurgery

## 2011-04-20 DIAGNOSIS — M47816 Spondylosis without myelopathy or radiculopathy, lumbar region: Secondary | ICD-10-CM

## 2011-04-20 DIAGNOSIS — M549 Dorsalgia, unspecified: Secondary | ICD-10-CM

## 2011-04-20 LAB — GLUCOSE, CAPILLARY

## 2011-04-20 MED ORDER — LEVOFLOXACIN 500 MG PO TABS: 500.0000 mg | ORAL_TABLET | Freq: Every day | ORAL | Status: AC

## 2011-04-20 NOTE — Progress Notes (Signed)
[id number] 

## 2011-04-20 NOTE — Progress Notes (Signed)
Utilization review completed.  

## 2011-04-20 NOTE — Progress Notes (Signed)
Patient d/c home with family Left floor via wheelchair accompanied by staff No c/o pain at d/c Patient verbalized understanding of d/c instructions, follow up appts, and new RX's [name], [employer]

## 2011-04-21 NOTE — Progress Notes (Signed)
[name], [name]              ACCOUNT NO.:  [id number]  MEDICAL RECORD NO.:  [id number]  LOCATION:                                 FACILITY:  PHYSICIAN:  Tanijah Morais L. Luan Pulling, M.D.DATE OF BIRTH:  1933-03-23  DATE OF PROCEDURE: DATE OF DISCHARGE:                                PROGRESS NOTE   Ms. [name] was admitted yesterday with acute gastroenteritis.  She had significant nausea and vomiting.  She developed some diarrhea last night which has resolved now.  She says she feels much better and feels that she could go home.  She also had what appeared to be pneumonia on chest x-ray, which was thought perhaps related to aspiration.  She has not had any more vomiting.  She has not had any more diarrhea.  She is able to keep her food down.  PHYSICAL EXAMINATION:  VITAL SIGNS:  Exam shows that her temp is 97.3, pulse 91, respirations 20, blood pressure 145/68, O2 sats 97%. CHEST:  Clear. HEART:  Regular. ABDOMEN:  Soft.  Bowel sounds present and active.  She is nontender. CENTRAL NERVOUS SYSTEM:  Shows grossly intact.  ASSESSMENT:  I think she is overall better.  My plan would be for her to be discharged home today.  We discussed having home health services, etc.  She does not want to do that.     Tor Tsuda L. Luan Pulling, M.D.     ELH/MEDQ  D:  04/20/2011  T:  04/20/2011  Job:  [id number]

## 2011-04-22 NOTE — Discharge Summary (Signed)
Physician Discharge Summary  Patient ID: [name] MRN: [id number] DOB/AGE: [birth year] 76 y.o. Primary Care Physician:Ori Trejos L, MD, MD Admit date: 04/18/2011 Discharge date: 04/22/2011    Discharge Diagnoses:   Principal Problem:  *CAP (community acquired pneumonia) Active Problems:  Nausea and vomiting  HTN (hypertension)  DM type 2 (diabetes mellitus, type 2)  Arthritis   Medication List  As of 04/22/2011  8:21 AM   TAKE these medications         amLODipine 10 MG tablet   Commonly known as: NORVASC   Take 10 mg by mouth every morning.      aspirin EC 81 MG tablet   Take 81 mg by mouth every morning.      benazepril 20 MG tablet   Commonly known as: LOTENSIN   Take 20 mg by mouth every morning.      Calcium 600+D 600-400 MG-UNIT per tablet   Generic drug: Calcium Carbonate-Vitamin D   Take 1 tablet by mouth 2 (two) times daily.      celecoxib 200 MG capsule   Commonly known as: CELEBREX   Take 200 mg by mouth every morning.      DULoxetine 60 MG capsule   Commonly known as: CYMBALTA   Take 60 mg by mouth every morning.      gabapentin 100 MG capsule   Commonly known as: NEURONTIN   Take 100 mg by mouth at bedtime.      glyBURIDE-metformin 2.5-500 MG per tablet   Commonly known as: GLUCOVANCE   Take 2 tablets by mouth 2 (two) times daily.      HYDROcodone-acetaminophen 5-325 MG per tablet   Commonly known as: NORCO   Take 1 tablet by mouth every 12 (twelve) hours as needed. For pain        LANTUS SOLOSTAR 100 UNIT/ML injection   Generic drug: insulin glargine   Inject 40 Units into the skin every morning.      levofloxacin 500 MG tablet   Commonly known as: LEVAQUIN   Take 1 tablet (500 mg total) by mouth daily.      metoprolol succinate 25 MG 24 hr tablet   Commonly known as: TOPROL-XL   Take 25 mg by mouth every morning.      omeprazole 20 MG capsule   Commonly known as: PRILOSEC   Take 20 mg by mouth at bedtime.      PROBIOTIC  FORMULA Caps   Take 1 capsule by mouth every morning.      promethazine 25 MG suppository   Commonly known as: PHENERGAN   Place 25 mg rectally every 6 (six) hours as needed. **Unwrap and insert one suppository every 6 to 8 hours as needed for nausea**      sitaGLIPtin 100 MG tablet   Commonly known as: JANUVIA   Take 100 mg by mouth every morning.            Discharged Condition: Improved    Consults: None  Significant Diagnostic Studies: Dg Chest 2 View  04/19/2011  *RADIOLOGY REPORT*  Clinical Data: Decreased O2 saturation; nausea and vomiting. History of diabetes and hypertension.  CHEST - 2 VIEW  Comparison: Chest radiograph performed 05/27/2008  Findings: The lungs are well-aerated.  There is suggestion of mild right lower lobe opacity, which may reflect mild pneumonia; minimal opacity on the lateral view appears more prominent than in 2010. There is no evidence of pleural effusion or pneumothorax.  The heart is normal in size; the  mediastinal contour is within normal limits.  No acute osseous abnormalities are seen.  Lumbar spinal fusion hardware is partially imaged.  IMPRESSION: Suggestion of mild right lower lobe opacity, raising concern for mild pneumonia.  Original Report Authenticated By: Santa Lighter, M.D.   Ct Abdomen Pelvis W Contrast  04/10/2011  *RADIOLOGY REPORT*  Clinical Data: Swelling and hardness on both sides of the abdomen  CT ABDOMEN AND PELVIS WITH CONTRAST  Technique:  Multidetector CT imaging of the abdomen and pelvis was performed following the standard protocol during bolus administration of intravenous contrast.  Contrast: 158mL OMNIPAQUE IOHEXOL 300 MG/ML IV SOLN  Comparison: None  Findings: The lung bases are clear.  The contour of the liver is slightly irregular.  The liver itself measures 18 cm in craniocaudal dimension.  No focal liver abnormalities identified.  The spleen measures 9.8 mm.  There are several small low density structures within the  spleen.  The largest measures water density and likely represents a congenital or acquired cyst.  Normal appearance of the adrenal glands.  Prior cholecystectomy.  No biliary dilatation.  Pancreas is normal.  There is a tiny nonobstructing stone in the inferior pole of the right kidney measuring 3 mm.  The left kidney is negative.  Scattered retroperitoneal lymph nodes are noted.  There is no adenopathy.  There is no pelvic or inguinal adenopathy identified.  The stomach appears normal.  The small bowel loops are unremarkable.  There are multiple colonic diverticula without acute inflammation.  The urinary bladder appears normal.  Review of the visualized osseous structures demonstrates postop changes from L1-L5 PLIF.  Since 07/28/2008 there has been marked progression of degenerative disc disease at the L1-2 level.  IMPRESSION:  1.  No acute findings identified within the abdomen or pelvis. 2.  The liver has a mildly nodular contour.  Correlate for any clinical signs or symptoms of cirrhosis. 3.  Extensive postop changes within the lumbar spine with marked progression of L1-2 degenerative disc disease.  Original Report Authenticated By: Angelita Ingles, M.D.    Lab Results: Basic Metabolic Panel: No results found for this basename: NA:2,K:2,CL:2,CO2:2,GLUCOSE:2,BUN:2,CREATININE:2,CALCIUM:2,MG:2,PHOS:2 in the last 72 hours Liver Function Tests: No results found for this basename: AST:2,ALT:2,ALKPHOS:2,BILITOT:2,PROT:2,ALBUMIN:2 in the last 72 hours   CBC: No results found for this basename: WBC:2,NEUTROABS:2,HGB:2,HCT:2,MCV:2,PLT:2 in the last 72 hours  No results found for this or any previous visit (from the past 240 hour(s)).   Hospital Course: She came to the emergency because of nausea and vomiting. She had vomited on multiple occasions. She was noticed to have an infiltrate on chest x-ray as well although she clinically did not have typical symptoms of pneumonia and was felt to perhaps she had  aspirated with multiple episodes of vomiting. She has multiple other medical problems but they were pretty stable throughout her brief hospitalization. She was treated with anti-emetics IV fluids.  Discharge Exam: Blood pressure 145/68, pulse 91, temperature 97.3 F (36.3 C), temperature source Oral, resp. rate 20, height 5\' 2"  (1.575 m), weight 79.379 kg (175 lb), SpO2 97.00%. She was awake and alert she was no longer having any vomiting her skin turgor was good her mucous membranes were moist and she was back to baseline  Disposition: Home  Discharge Orders    Future Appointments: Provider: Department: Dept Phone: Center:   05/04/2011 7:30 AM [facility] Dg C-Arm Rm 3 [facility] Diagnostic [id number] [facility]   05/04/2011 8:00 AM Gi-315 Ct 1 [facility]315 Ct Imaging [id number] [facility]  05/04/2011 8:15 AM Gi[facility]315 Ct 1 [facility]315 Ct Imaging [id number] [facility]     Future Orders Please Complete By Expires   Discharge patient         Follow-up Information    Follow up with Kimora Stankovic L, MD on 05/10/2011. (Appointment time: 9:30)    Contact information:   [address] [place] [phone]          Signed: Alonza Bogus Pager [phone]  04/22/2011, 8:21 AM

## 2011-05-01 ENCOUNTER — Other Ambulatory Visit: Payer: Self-pay | Admitting: Neurosurgery

## 2011-05-04 ENCOUNTER — Ambulatory Visit (HOSPITAL_COMMUNITY): Admission: RE | Admit: 2011-05-04 | Payer: Self-pay | Source: Ambulatory Visit

## 2011-05-04 ENCOUNTER — Ambulatory Visit (HOSPITAL_COMMUNITY)
Admission: RE | Admit: 2011-05-04 | Discharge: 2011-05-04 | Disposition: A | Discharge status: Home / Self Care | Payer: Medicare Other | Source: Ambulatory Visit | Attending: Neurosurgery | Admitting: Neurosurgery

## 2011-05-04 ENCOUNTER — Other Ambulatory Visit: Payer: Self-pay

## 2011-05-04 DIAGNOSIS — M545 Low back pain, unspecified: Secondary | ICD-10-CM | POA: Insufficient documentation

## 2011-05-04 DIAGNOSIS — M8448XA Pathological fracture, other site, initial encounter for fracture: Secondary | ICD-10-CM | POA: Insufficient documentation

## 2011-05-04 DIAGNOSIS — M47816 Spondylosis without myelopathy or radiculopathy, lumbar region: Secondary | ICD-10-CM

## 2011-05-04 DIAGNOSIS — M549 Dorsalgia, unspecified: Secondary | ICD-10-CM

## 2011-05-04 DIAGNOSIS — Z981 Arthrodesis status: Secondary | ICD-10-CM | POA: Insufficient documentation

## 2011-05-04 DIAGNOSIS — M5144 Schmorl's nodes, thoracic region: Secondary | ICD-10-CM | POA: Insufficient documentation

## 2011-05-04 DIAGNOSIS — M5137 Other intervertebral disc degeneration, lumbosacral region: Secondary | ICD-10-CM | POA: Insufficient documentation

## 2011-05-04 DIAGNOSIS — M51379 Other intervertebral disc degeneration, lumbosacral region without mention of lumbar back pain or lower extremity pain: Secondary | ICD-10-CM | POA: Insufficient documentation

## 2011-05-04 MED ORDER — ONDANSETRON HCL 4 MG/2ML IJ SOLN: 4.0000 mg | Freq: Four times a day (QID) | INTRAMUSCULAR | Status: DC | PRN

## 2011-05-04 MED ORDER — DIAZEPAM 5 MG PO TABS
ORAL_TABLET | ORAL | Status: AC
  Administered 2011-05-04: 10 mg via ORAL
  Filled 2011-05-04: qty 2

## 2011-05-04 MED ORDER — IOHEXOL 300 MG/ML  SOLN
10.0000 mL | Freq: Once | INTRAMUSCULAR | Status: AC | PRN
  Administered 2011-05-04: 10 mL via INTRATHECAL

## 2011-05-04 MED ORDER — DIAZEPAM 5 MG PO TABS
10.0000 mg | ORAL_TABLET | Freq: Once | ORAL | Status: AC
  Administered 2011-05-04: 10 mg via ORAL

## 2011-05-04 NOTE — Discharge Instructions (Addendum)
Resume metformin and cymbalta in 48 hoursMyelogram and Lumbar Puncture Discharge Instructions  1. Go home and rest quietly for the next 24 hours.  It is important to lie flat for the next 24 hours.  Get up only to go to the restroom.  You may lie in the bed or on a couch on your back, your stomach, your left side or your right side.  You may have one pillow under your head.  You may have pillows between your knees while you are on your side or under your knees while you are on your back.  2. DO NOT drive today.  Recline the seat as far back as it will go, while still wearing your seat belt, on the way home.  3. You may get up to go to the bathroom as needed.  You may sit up for 10 minutes to eat.  You may resume your normal diet and medications unless otherwise indicated.  4. The incidence of headache, nausea, or vomiting is about 5% (one in 20 patients).  If you develop a headache, lie flat and drink plenty of fluids until the headache goes away.  Caffeinated beverages may be helpful.  If you develop severe nausea and vomiting or a headache that does not go away with flat bed rest, call MD.  5. You may resume normal activities after your 24 hours of bed rest is over; however, do not exert yourself strongly or do any heavy lifting tomorrow.  6. Call your physician for a follow-up appointment.  The results of your myelogram will be sent directly to your physician by the following day.  7. If you have any questions or if complications develop after you arrive home, please call MD.  Discharge instructions have been explained to the patient.  The patient, or the person responsible for the patient, fully understands these instructions.

## 2011-05-04 NOTE — Procedures (Signed)
Omnipaque 300; T12/L1 puncture

## 2011-05-04 NOTE — Discharge Summary (Signed)
Physician Discharge Summary  Patient ID: [name] MRN: [id number] DOB/AGE: 76-Dec-1935 76 y.o.  Admit date: 05/04/2011 Discharge date: 05/04/2011  Admission Diagnoses: Lumbar scoliosis  Discharge Diagnoses: Lumbar scoliosis Active Problems:  * No active hospital problems. *    Discharged Condition: good  Hospital Course: Myelogram  Consults: None  Significant Diagnostic Studies: radiology: Myelogram: T/L  Treatments: None  Discharge Exam: Blood pressure 133/80, pulse 101, temperature 97.2 F (36.2 C), temperature source Oral, resp. rate 16, height 5\' 2"  (1.575 m), weight 34.02 kg (75 lb), SpO2 94.00%. Neurologic: Alert and oriented X 3, normal strength and tone. Normal symmetric reflexes. Normal coordination and gait  Disposition: 01-Home or Self Care  Discharge Orders    Future Appointments: Provider: Department: Dept Phone: Center:   05/04/2011  8:30 AM Mc-Ct 2 Mc-Ct Imaging  [facility]     Medication List  As of 05/04/2011  8:20 AM   STOP taking these medications         DULoxetine 60 MG capsule      glyBURIDE-metformin 2.5-500 MG per tablet         TAKE these medications         amLODipine 10 MG tablet   Commonly known as: NORVASC   Take 10 mg by mouth daily.      aspirin EC 81 MG tablet   Take 81 mg by mouth daily.      benazepril 20 MG tablet   Commonly known as: LOTENSIN   Take 20 mg by mouth daily.      Calcium 600+D 600-400 MG-UNIT per tablet   Generic drug: Calcium Carbonate-Vitamin D   Take 1 tablet by mouth 2 (two) times daily.      celecoxib 200 MG capsule   Commonly known as: CELEBREX   Take 200 mg by mouth daily.      gabapentin 100 MG capsule   Commonly known as: NEURONTIN   Take 100 mg by mouth at bedtime.      HYDROcodone-acetaminophen 5-325 MG per tablet   Commonly known as: NORCO   Take 1 tablet by mouth every 12 (twelve) hours as needed. For pain        LANTUS SOLOSTAR 100 UNIT/ML injection   Generic drug: insulin glargine   Inject 40 Units into the skin daily.      metoprolol succinate 25 MG 24 hr tablet   Commonly known as: TOPROL-XL   Take 25 mg by mouth daily.      omeprazole 20 MG capsule   Commonly known as: PRILOSEC   Take 20 mg by mouth at bedtime.      PROBIOTIC FORMULA Caps   Take 1 capsule by mouth daily.      promethazine 25 MG suppository   Commonly known as: PHENERGAN   Place 25 mg rectally every 6 (six) hours as needed. **Unwrap and insert one suppository every 6 to 8 hours as needed for nausea**      sitaGLIPtin 100 MG tablet   Commonly known as: JANUVIA   Take 100 mg by mouth daily.             Signed: Ozell Juhasz D 05/04/2011, 8:20 AM

## 2011-05-05 ENCOUNTER — Telehealth (HOSPITAL_COMMUNITY): Payer: Self-pay

## 2011-07-06 ENCOUNTER — Ambulatory Visit (HOSPITAL_COMMUNITY)
Admission: RE | Admit: 2011-07-06 | Discharge: 2011-07-06 | Disposition: A | Discharge status: Home / Self Care | Payer: 59 | Source: Ambulatory Visit | Attending: Pulmonary Disease | Admitting: Pulmonary Disease

## 2011-07-06 ENCOUNTER — Other Ambulatory Visit (HOSPITAL_COMMUNITY): Payer: Self-pay | Admitting: Pulmonary Disease

## 2011-07-06 DIAGNOSIS — R0989 Other specified symptoms and signs involving the circulatory and respiratory systems: Secondary | ICD-10-CM

## 2011-07-06 DIAGNOSIS — R059 Cough, unspecified: Secondary | ICD-10-CM

## 2011-07-06 DIAGNOSIS — R05 Cough: Secondary | ICD-10-CM

## 2011-12-05 ENCOUNTER — Other Ambulatory Visit (HOSPITAL_COMMUNITY): Payer: Self-pay | Admitting: Pulmonary Disease

## 2011-12-05 DIAGNOSIS — N63 Unspecified lump in unspecified breast: Secondary | ICD-10-CM

## 2011-12-20 ENCOUNTER — Ambulatory Visit (HOSPITAL_COMMUNITY)
Admission: RE | Admit: 2011-12-20 | Discharge: 2011-12-20 | Disposition: A | Discharge status: Home / Self Care | Payer: Medicare Other | Source: Ambulatory Visit | Attending: Pulmonary Disease | Admitting: Pulmonary Disease

## 2011-12-20 ENCOUNTER — Other Ambulatory Visit (HOSPITAL_COMMUNITY): Payer: Self-pay | Admitting: Pulmonary Disease

## 2011-12-20 DIAGNOSIS — N63 Unspecified lump in unspecified breast: Secondary | ICD-10-CM | POA: Insufficient documentation

## 2011-12-20 DIAGNOSIS — Z139 Encounter for screening, unspecified: Secondary | ICD-10-CM

## 2012-01-15 ENCOUNTER — Ambulatory Visit (HOSPITAL_COMMUNITY)
Admission: RE | Admit: 2012-01-15 | Discharge: 2012-01-15 | Disposition: A | Discharge status: Home / Self Care | Payer: Medicare Other | Source: Ambulatory Visit | Attending: Pulmonary Disease | Admitting: Pulmonary Disease

## 2012-01-15 DIAGNOSIS — Z139 Encounter for screening, unspecified: Secondary | ICD-10-CM

## 2012-01-15 DIAGNOSIS — Z1231 Encounter for screening mammogram for malignant neoplasm of breast: Secondary | ICD-10-CM | POA: Insufficient documentation

## 2012-01-19 ENCOUNTER — Other Ambulatory Visit (HOSPITAL_COMMUNITY): Payer: Self-pay | Admitting: Pulmonary Disease

## 2012-01-19 ENCOUNTER — Other Ambulatory Visit: Payer: Self-pay | Admitting: Pulmonary Disease

## 2012-01-19 DIAGNOSIS — R51 Headache: Secondary | ICD-10-CM

## 2012-01-19 DIAGNOSIS — R928 Other abnormal and inconclusive findings on diagnostic imaging of breast: Secondary | ICD-10-CM

## 2012-01-22 ENCOUNTER — Ambulatory Visit (HOSPITAL_COMMUNITY)
Admission: RE | Admit: 2012-01-22 | Discharge: 2012-01-22 | Disposition: A | Discharge status: Home / Self Care | Payer: Medicare Other | Source: Ambulatory Visit | Attending: Pulmonary Disease | Admitting: Pulmonary Disease

## 2012-01-22 DIAGNOSIS — R51 Headache: Secondary | ICD-10-CM | POA: Insufficient documentation

## 2012-01-22 DIAGNOSIS — I10 Essential (primary) hypertension: Secondary | ICD-10-CM | POA: Insufficient documentation

## 2012-02-07 ENCOUNTER — Encounter (HOSPITAL_COMMUNITY): Payer: Self-pay

## 2012-02-14 ENCOUNTER — Encounter (HOSPITAL_COMMUNITY): Payer: Self-pay

## 2012-02-15 ENCOUNTER — Other Ambulatory Visit (HOSPITAL_COMMUNITY): Payer: Self-pay | Admitting: Pulmonary Disease

## 2012-02-15 ENCOUNTER — Ambulatory Visit (HOSPITAL_COMMUNITY)
Admission: RE | Admit: 2012-02-15 | Discharge: 2012-02-15 | Disposition: A | Discharge status: Home / Self Care | Payer: Medicare Other | Source: Ambulatory Visit | Attending: Pulmonary Disease | Admitting: Pulmonary Disease

## 2012-02-15 ENCOUNTER — Ambulatory Visit
Admission: RE | Admit: 2012-02-15 | Discharge: 2012-02-15 | Disposition: A | Discharge status: Home / Self Care | Payer: Medicare Other | Source: Ambulatory Visit | Attending: Pulmonary Disease | Admitting: Pulmonary Disease

## 2012-02-15 ENCOUNTER — Other Ambulatory Visit: Payer: Self-pay | Admitting: Pulmonary Disease

## 2012-02-15 DIAGNOSIS — IMO0002 Reserved for concepts with insufficient information to code with codable children: Secondary | ICD-10-CM

## 2012-02-15 DIAGNOSIS — R928 Other abnormal and inconclusive findings on diagnostic imaging of breast: Secondary | ICD-10-CM

## 2012-02-15 DIAGNOSIS — N63 Unspecified lump in unspecified breast: Secondary | ICD-10-CM | POA: Insufficient documentation

## 2012-02-15 NOTE — Progress Notes (Signed)
Lidocaine 2%         27mL injected                         Left breast biopsy performed

## 2012-02-16 ENCOUNTER — Other Ambulatory Visit: Payer: Self-pay | Admitting: Pulmonary Disease

## 2012-02-16 DIAGNOSIS — C50912 Malignant neoplasm of unspecified site of left female breast: Secondary | ICD-10-CM

## 2012-02-19 ENCOUNTER — Encounter (INDEPENDENT_AMBULATORY_CARE_PROVIDER_SITE_OTHER): Payer: Self-pay | Admitting: General Surgery

## 2012-02-19 ENCOUNTER — Ambulatory Visit (INDEPENDENT_AMBULATORY_CARE_PROVIDER_SITE_OTHER): Payer: Medicare Other | Admitting: General Surgery

## 2012-02-19 VITALS — BP 158/86 | HR 64 | Temp 97.3°F | Resp 16 | Ht 62.0 in | Wt 183.0 lb

## 2012-02-19 DIAGNOSIS — C50919 Malignant neoplasm of unspecified site of unspecified female breast: Secondary | ICD-10-CM

## 2012-02-19 NOTE — Patient Instructions (Signed)
If MRI is the same then plan for left breast wire localized lumpectomy and sentinel node mapping

## 2012-02-19 NOTE — Progress Notes (Signed)
Subjective:     Patient ID: [name], female   DOB: [birth year], 78 y.o.   MRN: [id number]  HPI We're asked to see the patient in consultation by Dr. Sadie Haber to evaluate her for a left breast cancer. The patient is a 78 year old white female who recently went for a routine screening mammogram at which time a mass was found in the lower inner left breast. This was biopsied and came back as an invasive ductal cancer. It measured 1 cm in maximal dimension by ultrasound. She denies any breast pain. She denies any discharge from her nipple. She has no personal or family history of breast cancers  Review of Systems  Constitutional: Negative.   HENT: Negative.   Eyes: Negative.   Respiratory: Negative.   Cardiovascular: Negative.   Gastrointestinal: Negative.   Genitourinary: Negative.   Musculoskeletal: Negative.   Skin: Negative.   Neurological: Negative.   Hematological: Negative.   Psychiatric/Behavioral: Negative.        Objective:   Physical Exam  Constitutional: She is oriented to person, place, and time. She appears well-developed and well-nourished.  HENT:  Head: Normocephalic and atraumatic.  Eyes: Conjunctivae normal and EOM are normal. Pupils are equal, round, and reactive to light.  Neck: Normal range of motion. Neck supple.  Cardiovascular: Normal rate, regular rhythm and normal heart sounds.   Pulmonary/Chest: Effort normal and breath sounds normal.       There is no palpable mass in either breast. There is no palpable axillary or supraclavicular cervical lymphadenopathy.  Abdominal: Soft. Bowel sounds are normal. She exhibits no mass. There is no tenderness.  Musculoskeletal: Normal range of motion.  Lymphadenopathy:    She has no cervical adenopathy.  Neurological: She is alert and oriented to person, place, and time.  Skin: Skin is warm and dry.  Psychiatric: She has a normal mood and affect. Her behavior is normal.       Assessment:     The patient has what  appears to be a small stage I left breast cancer in the lower inner quadrant. I've discussed with her and her family in detail the different options for treatment. At this point she favors breast conservation. I think this is a very reasonable choice for her. She is also a good candidate for sentinel node mapping. I have discussed with her in detail the risks and benefits of the operation to do this as well as some of the technical aspects and she understands and wishes to proceed. Once we have her pathology back then we will plan to refer her to the medical and radiation oncologist. Her MRI is scheduled for Saturday. If her MRI is similar to the information regarding have than I think we will proceed with this treatment plan.    Plan:     Plan for MRI and then probable left breast wire localized lumpectomy and sentinel node mapping

## 2012-02-24 ENCOUNTER — Ambulatory Visit
Admission: RE | Admit: 2012-02-24 | Discharge: 2012-02-24 | Disposition: A | Discharge status: Home / Self Care | Payer: Medicare Other | Source: Ambulatory Visit | Attending: Pulmonary Disease | Admitting: Pulmonary Disease

## 2012-02-24 DIAGNOSIS — C50912 Malignant neoplasm of unspecified site of left female breast: Secondary | ICD-10-CM

## 2012-02-24 MED ORDER — GADOBENATE DIMEGLUMINE 529 MG/ML IV SOLN
16.0000 mL | Freq: Once | INTRAVENOUS | Status: AC | PRN
  Administered 2012-02-24: 16 mL via INTRAVENOUS

## 2012-02-27 ENCOUNTER — Other Ambulatory Visit (INDEPENDENT_AMBULATORY_CARE_PROVIDER_SITE_OTHER): Payer: Self-pay | Admitting: General Surgery

## 2012-02-27 DIAGNOSIS — C50919 Malignant neoplasm of unspecified site of unspecified female breast: Secondary | ICD-10-CM

## 2012-02-29 ENCOUNTER — Telehealth (INDEPENDENT_AMBULATORY_CARE_PROVIDER_SITE_OTHER): Payer: Self-pay | Admitting: General Surgery

## 2012-02-29 NOTE — Telephone Encounter (Signed)
Spoke with pt and explained that her MR looked the same and that we are scheduling her for surgery.  Explained that she needs to stop blood thinners 5 days before the surgery date and that our surgery schedulers should call her in the next couple days to schedule her surgery over the phone.

## 2012-03-06 ENCOUNTER — Encounter (HOSPITAL_COMMUNITY): Payer: Self-pay | Admitting: Pharmacy Technician

## 2012-03-11 ENCOUNTER — Encounter (HOSPITAL_COMMUNITY)
Admission: RE | Admit: 2012-03-11 | Discharge: 2012-03-11 | Payer: Medicare Other | Source: Ambulatory Visit | Attending: General Surgery | Admitting: General Surgery

## 2012-03-11 ENCOUNTER — Encounter (HOSPITAL_COMMUNITY): Payer: Self-pay

## 2012-03-11 HISTORY — DX: Gastro-esophageal reflux disease without esophagitis: K21.9

## 2012-03-11 HISTORY — DX: Other complications of anesthesia, initial encounter: T88.59XA

## 2012-03-11 HISTORY — DX: Other specified postprocedural states: Z98.890

## 2012-03-11 HISTORY — DX: Adverse effect of unspecified anesthetic, initial encounter: T41.45XA

## 2012-03-11 HISTORY — DX: Other specified postprocedural states: R11.2

## 2012-03-11 HISTORY — DX: Mental disorder, not otherwise specified: F99

## 2012-03-11 HISTORY — DX: Chronic kidney disease, unspecified: N18.9

## 2012-03-11 MED ORDER — CEFAZOLIN SODIUM-DEXTROSE 2-3 GM-% IV SOLR
2.0000 g | INTRAVENOUS | Status: AC
  Administered 2012-03-12: 2 g via INTRAVENOUS
  Filled 2012-03-11: qty 50

## 2012-03-11 NOTE — Progress Notes (Signed)
Spoke with ASamul Dada, to review prev. Ekg's.  It is recommended that the EKG be repeated upon arrival to [facility] on DOS.

## 2012-03-11 NOTE — Progress Notes (Signed)
Pt. Not able to make her PAT appt. Today. Interviewed done over the phone. Pt. Reports that she has been followed by [facility] only because she was in a urology study & [place] was tracking her EKG. She states she hasn't ever had a stress test.

## 2012-03-12 ENCOUNTER — Encounter (HOSPITAL_COMMUNITY)
Admission: RE | Admit: 2012-03-12 | Discharge: 2012-03-12 | Disposition: A | Discharge status: Home / Self Care | Payer: Medicare Other | Source: Ambulatory Visit | Attending: General Surgery | Admitting: General Surgery

## 2012-03-12 ENCOUNTER — Encounter (HOSPITAL_COMMUNITY)
Admission: RE | Disposition: A | Discharge status: Home / Self Care | Payer: Self-pay | Source: Ambulatory Visit | Attending: General Surgery

## 2012-03-12 ENCOUNTER — Ambulatory Visit
Admission: RE | Admit: 2012-03-12 | Discharge: 2012-03-12 | Disposition: A | Discharge status: Home / Self Care | Payer: Medicare Other | Source: Ambulatory Visit | Attending: General Surgery | Admitting: General Surgery

## 2012-03-12 ENCOUNTER — Encounter (HOSPITAL_COMMUNITY): Payer: Self-pay | Admitting: Anesthesiology

## 2012-03-12 ENCOUNTER — Ambulatory Visit (HOSPITAL_COMMUNITY)
Admission: RE | Admit: 2012-03-12 | Discharge: 2012-03-12 | Disposition: A | Discharge status: Home / Self Care | Payer: Medicare Other | Source: Ambulatory Visit | Attending: General Surgery | Admitting: General Surgery

## 2012-03-12 ENCOUNTER — Ambulatory Visit (HOSPITAL_COMMUNITY): Payer: Medicare Other | Admitting: Anesthesiology

## 2012-03-12 ENCOUNTER — Encounter (HOSPITAL_COMMUNITY): Payer: Self-pay | Admitting: *Deleted

## 2012-03-12 DIAGNOSIS — D0590 Unspecified type of carcinoma in situ of unspecified breast: Secondary | ICD-10-CM

## 2012-03-12 DIAGNOSIS — C50919 Malignant neoplasm of unspecified site of unspecified female breast: Secondary | ICD-10-CM

## 2012-03-12 DIAGNOSIS — E119 Type 2 diabetes mellitus without complications: Secondary | ICD-10-CM | POA: Insufficient documentation

## 2012-03-12 DIAGNOSIS — K219 Gastro-esophageal reflux disease without esophagitis: Secondary | ICD-10-CM | POA: Insufficient documentation

## 2012-03-12 DIAGNOSIS — C50319 Malignant neoplasm of lower-inner quadrant of unspecified female breast: Secondary | ICD-10-CM | POA: Insufficient documentation

## 2012-03-12 DIAGNOSIS — M129 Arthropathy, unspecified: Secondary | ICD-10-CM | POA: Insufficient documentation

## 2012-03-12 DIAGNOSIS — I10 Essential (primary) hypertension: Secondary | ICD-10-CM | POA: Insufficient documentation

## 2012-03-12 HISTORY — PX: BREAST LUMPECTOMY WITH NEEDLE LOCALIZATION AND AXILLARY SENTINEL LYMPH NODE BX: SHX5760

## 2012-03-12 LAB — SURGICAL PCR SCREEN
MRSA, PCR: POSITIVE — AB
Staphylococcus aureus: POSITIVE — AB

## 2012-03-12 LAB — GLUCOSE, CAPILLARY
Glucose-Capillary: 152 mg/dL — ABNORMAL HIGH (ref 70–99)
Glucose-Capillary: 99 mg/dL (ref 70–99)

## 2012-03-12 LAB — CBC
HCT: 35 % — ABNORMAL LOW (ref 36.0–46.0)
MCHC: 32.3 g/dL (ref 30.0–36.0)
Platelets: 207 10*3/uL (ref 150–400)
RDW: 14.6 % (ref 11.5–15.5)
WBC: 8.4 10*3/uL (ref 4.0–10.5)

## 2012-03-12 LAB — BASIC METABOLIC PANEL
Chloride: 102 mEq/L (ref 96–112)
GFR calc Af Amer: >90 mL/min (ref >90)
GFR calc non Af Amer: 85 mL/min — ABNORMAL LOW (ref >90)
Potassium: 4.2 mEq/L (ref 3.5–5.1)
Sodium: 140 mEq/L (ref 135–145)

## 2012-03-12 SURGERY — BREAST LUMPECTOMY WITH NEEDLE LOCALIZATION AND AXILLARY SENTINEL LYMPH NODE BX
Anesthesia: General | Site: Breast | Laterality: Left | Wound class: Clean

## 2012-03-12 MED ORDER — CHLORHEXIDINE GLUCONATE 4 % EX LIQD: 1.0000 "application " | Freq: Once | CUTANEOUS | Status: DC

## 2012-03-12 MED ORDER — FENTANYL CITRATE 0.05 MG/ML IJ SOLN
INTRAMUSCULAR | Status: DC | PRN
  Administered 2012-03-12: 25 ug via INTRAVENOUS
  Administered 2012-03-12 (×2): 50 ug via INTRAVENOUS

## 2012-03-12 MED ORDER — FENTANYL CITRATE 0.05 MG/ML IJ SOLN: 50.0000 ug | INTRAMUSCULAR | Status: DC | PRN

## 2012-03-12 MED ORDER — BUPIVACAINE-EPINEPHRINE PF 0.25-1:200000 % IJ SOLN
INTRAMUSCULAR | Status: AC
  Filled 2012-03-12: qty 30

## 2012-03-12 MED ORDER — ONDANSETRON HCL 4 MG/2ML IJ SOLN
INTRAMUSCULAR | Status: DC | PRN
  Administered 2012-03-12: 4 mg via INTRAVENOUS

## 2012-03-12 MED ORDER — FENTANYL CITRATE 0.05 MG/ML IJ SOLN
INTRAMUSCULAR | Status: AC
  Filled 2012-03-12: qty 2

## 2012-03-12 MED ORDER — OXYCODONE HCL 5 MG PO TABS
ORAL_TABLET | ORAL | Status: AC
  Filled 2012-03-12: qty 1

## 2012-03-12 MED ORDER — LACTATED RINGERS IV SOLN
INTRAVENOUS | Status: DC | PRN
  Administered 2012-03-12 (×2): via INTRAVENOUS

## 2012-03-12 MED ORDER — TECHNETIUM TC 99M SULFUR COLLOID FILTERED
1.0000 | Freq: Once | INTRAVENOUS | Status: AC | PRN
  Administered 2012-03-12: 1 via INTRADERMAL

## 2012-03-12 MED ORDER — MIDAZOLAM HCL 2 MG/2ML IJ SOLN: 1.0000 mg | INTRAMUSCULAR | Status: DC | PRN

## 2012-03-12 MED ORDER — SODIUM CHLORIDE 0.9 % IJ SOLN
INTRAMUSCULAR | Status: DC | PRN
  Administered 2012-03-12: 12:00:00 via INTRADERMAL

## 2012-03-12 MED ORDER — MIDAZOLAM HCL 5 MG/5ML IJ SOLN
INTRAMUSCULAR | Status: DC | PRN
  Administered 2012-03-12 (×2): 1 mg via INTRAVENOUS

## 2012-03-12 MED ORDER — PROPOFOL 10 MG/ML IV BOLUS
INTRAVENOUS | Status: DC | PRN
  Administered 2012-03-12: 130 mg via INTRAVENOUS

## 2012-03-12 MED ORDER — LIDOCAINE HCL (CARDIAC) 20 MG/ML IV SOLN
INTRAVENOUS | Status: DC | PRN
  Administered 2012-03-12: 80 mg via INTRAVENOUS

## 2012-03-12 MED ORDER — OXYCODONE HCL 5 MG/5ML PO SOLN: 5.0000 mg | Freq: Once | ORAL | Status: AC | PRN

## 2012-03-12 MED ORDER — 0.9 % SODIUM CHLORIDE (POUR BTL) OPTIME
TOPICAL | Status: DC | PRN
  Administered 2012-03-12: 1000 mL

## 2012-03-12 MED ORDER — HYDROCODONE-ACETAMINOPHEN 5-325 MG PO TABS: 1.0000 | ORAL_TABLET | Freq: Four times a day (QID) | ORAL | Status: DC | PRN

## 2012-03-12 MED ORDER — ONDANSETRON HCL 4 MG/2ML IJ SOLN: 4.0000 mg | Freq: Four times a day (QID) | INTRAMUSCULAR | Status: DC | PRN

## 2012-03-12 MED ORDER — METHYLENE BLUE 1 % INJ SOLN
INTRAMUSCULAR | Status: AC
  Filled 2012-03-12: qty 10

## 2012-03-12 MED ORDER — BUPIVACAINE-EPINEPHRINE 0.25% -1:200000 IJ SOLN
INTRAMUSCULAR | Status: DC | PRN
  Administered 2012-03-12: 30 mL

## 2012-03-12 MED ORDER — FENTANYL CITRATE 0.05 MG/ML IJ SOLN
25.0000 ug | INTRAMUSCULAR | Status: DC | PRN
  Administered 2012-03-12: 25 ug via INTRAVENOUS
  Administered 2012-03-12: 50 ug via INTRAVENOUS

## 2012-03-12 MED ORDER — OXYCODONE HCL 5 MG PO TABS
5.0000 mg | ORAL_TABLET | Freq: Once | ORAL | Status: AC | PRN
  Administered 2012-03-12: 5 mg via ORAL

## 2012-03-12 MED ORDER — MUPIROCIN 2 % EX OINT
TOPICAL_OINTMENT | CUTANEOUS | Status: AC
  Administered 2012-03-12: 1 via NASAL
  Filled 2012-03-12: qty 22

## 2012-03-12 MED ORDER — LACTATED RINGERS IV SOLN
INTRAVENOUS | Status: DC
  Administered 2012-03-12: 11:00:00 via INTRAVENOUS

## 2012-03-12 SURGICAL SUPPLY — 50 items
ADH SKN CLS APL DERMABOND .7 (GAUZE/BANDAGES/DRESSINGS) ×1
APPLIER CLIP 9.375 MED OPEN (MISCELLANEOUS)
APR CLP MED 9.3 20 MLT OPN (MISCELLANEOUS)
BINDER BREAST LRG (GAUZE/BANDAGES/DRESSINGS) IMPLANT
BINDER BREAST XLRG (GAUZE/BANDAGES/DRESSINGS) IMPLANT
BLADE SURG 10 STRL SS (BLADE) ×2 IMPLANT
BLADE SURG 15 STRL LF DISP TIS (BLADE) ×1 IMPLANT
BLADE SURG 15 STRL SS (BLADE) ×2
CANISTER SUCTION 2500CC (MISCELLANEOUS) ×2 IMPLANT
CHLORAPREP W/TINT 26ML (MISCELLANEOUS) ×2 IMPLANT
CLIP APPLIE 9.375 MED OPEN (MISCELLANEOUS) IMPLANT
CLOTH BEACON ORANGE TIMEOUT ST (SAFETY) ×2 IMPLANT
CONT SPEC 4OZ CLIKSEAL STRL BL (MISCELLANEOUS) ×2 IMPLANT
COVER PROBE W GEL 5X96 (DRAPES) ×3 IMPLANT
COVER SURGICAL LIGHT HANDLE (MISCELLANEOUS) ×2 IMPLANT
DERMABOND ADVANCED (GAUZE/BANDAGES/DRESSINGS) ×1
DERMABOND ADVANCED .7 DNX12 (GAUZE/BANDAGES/DRESSINGS) ×1 IMPLANT
DEVICE DUBIN SPECIMEN MAMMOGRA (MISCELLANEOUS) ×2 IMPLANT
DRAPE CHEST BREAST 15X10 FENES (DRAPES) ×2 IMPLANT
DRAPE UTILITY 15X26 W/TAPE STR (DRAPE) ×4 IMPLANT
ELECT COATED BLADE 2.86 ST (ELECTRODE) ×2 IMPLANT
ELECT REM PT RETURN 9FT ADLT (ELECTROSURGICAL) ×2
ELECTRODE REM PT RTRN 9FT ADLT (ELECTROSURGICAL) ×1 IMPLANT
GLOVE BIO SURGEON STRL SZ7.5 (GLOVE) ×3 IMPLANT
GLOVE BIOGEL PI IND STRL 7.5 (GLOVE) IMPLANT
GLOVE BIOGEL PI INDICATOR 7.5 (GLOVE) ×1
GOWN STRL NON-REIN LRG LVL3 (GOWN DISPOSABLE) ×4 IMPLANT
KIT BASIN OR (CUSTOM PROCEDURE TRAY) ×2 IMPLANT
KIT MARKER MARGIN INK (KITS) ×1 IMPLANT
KIT ROOM TURNOVER OR (KITS) ×2 IMPLANT
NDL 18GX1X1/2 (RX/OR ONLY) (NEEDLE) ×1 IMPLANT
NDL HYPO 25GX1X1/2 BEV (NEEDLE) ×2 IMPLANT
NEEDLE 18GX1X1/2 (RX/OR ONLY) (NEEDLE) ×2 IMPLANT
NEEDLE HYPO 25GX1X1/2 BEV (NEEDLE) ×4 IMPLANT
NS IRRIG 1000ML POUR BTL (IV SOLUTION) ×2 IMPLANT
PACK SURGICAL SETUP 50X90 (CUSTOM PROCEDURE TRAY) ×2 IMPLANT
PAD ARMBOARD 7.5X6 YLW CONV (MISCELLANEOUS) ×2 IMPLANT
PENCIL BUTTON HOLSTER BLD 10FT (ELECTRODE) ×2 IMPLANT
SPONGE LAP 18X18 X RAY DECT (DISPOSABLE) ×2 IMPLANT
SUT MNCRL AB 4-0 PS2 18 (SUTURE) ×3 IMPLANT
SUT SILK 2 0 SH (SUTURE) IMPLANT
SUT VIC AB 3-0 54X BRD REEL (SUTURE) ×1 IMPLANT
SUT VIC AB 3-0 BRD 54 (SUTURE) ×2
SUT VIC AB 3-0 SH 18 (SUTURE) ×2 IMPLANT
SYR BULB 3OZ (MISCELLANEOUS) ×2 IMPLANT
SYR CONTROL 10ML LL (SYRINGE) ×4 IMPLANT
TOWEL OR 17X24 6PK STRL BLUE (TOWEL DISPOSABLE) ×2 IMPLANT
TOWEL OR 17X26 10 PK STRL BLUE (TOWEL DISPOSABLE) ×2 IMPLANT
TUBE CONNECTING 12X1/4 (SUCTIONS) ×2 IMPLANT
YANKAUER SUCT BULB TIP NO VENT (SUCTIONS) ×2 IMPLANT

## 2012-03-12 NOTE — Preoperative (Signed)
Beta Blockers   Reason not to administer Beta Blockers:Not Applicable 

## 2012-03-12 NOTE — Op Note (Signed)
03/12/2012  12:44 PM  PATIENT:  [name]  79 y.o. female  PRE-OPERATIVE DIAGNOSIS:  Left Breast Cancer  POST-OPERATIVE DIAGNOSIS:  Left Breast Cancer  PROCEDURE:  Procedure(s) (LRB) with comments: BREAST LUMPECTOMY WITH NEEDLE LOCALIZATION AND AXILLARY SENTINEL LYMPH NODE BX (Left)  SURGEON:  Surgeon(s) and Role:    * Merrie Roof, MD - Primary  PHYSICIAN ASSISTANT:   ASSISTANTS: none   ANESTHESIA:   general  EBL:  Total I/O In: 1000 [I.V.:1000] Out: -   BLOOD ADMINISTERED:none  DRAINS: none   LOCAL MEDICATIONS USED:  MARCAINE     SPECIMEN:  Source of Specimen:  left breast tissue and sentinel node X 1  DISPOSITION OF SPECIMEN:  PATHOLOGY  COUNTS:  YES  TOURNIQUET:  * No tourniquets in log *  DICTATION: .Dragon Dictation After informed consent was obtained the patient was brought to the operating room placed in the supine position on the operating room table. After adequate induction of general anesthesia the patient's left chest, breast, and axilla were prepped with ChloraPrep, allowed to dry, and draped in usual sterile manner. Earlier in the day the patient underwent a wire localization procedure and the wire was entering the left breast in the lower inner quadrant and headed laterally. Also earlier in the day the patient underwent injection of 1 mCi of technetium sulfur colloid in the subareolar position. At this point, 2 cc of methylene blue and 3 cc of injectable saline were also injected in the subareolar position the breast was massaged for several minutes. A neoprobe device was used to identify a hot spot in the left axilla. A small transverse incision was made with a 15 blade knife overlying the hot spot. This incision was carried through the skin and subcutaneous tissue sharply with the electrocautery until the axilla was entered. A [place] retractor was deployed. Using the neoprobe to direct blunt hemostat dissection we were able to identify a hot  lymph node. This was excised by a combination of sharp Bovie dissection and then the lymphatics were clamped with hemostats divided and ligated with 3-0 Vicryl ties. Ex vivo counts on the sentinel node were approximately 300. No other hot, blue, or palpable lymph nodes were identified in the left axilla. The wound was infiltrated with quarter percent Marcaine. The deep layer the wound was closed with interrupted 3-0 Vicryl stitches. The skin was closed with a running 4-0 Monocryl subcuticular stitch. Attention was then turned to the left breast. A radially oriented incision was made in the lower inner left breast overlying the path of the wire. This incision was carried through the skin and subcutaneous tissue sharply with the electrocautery. Once in the breast tissues the path of the wire could be palpated. A circular portion of breast tissue was excised sharply around the path of the wire. This dissection was carried all the way to the chest wall. Once the specimen was removed it was oriented according to the assigned paint colors. A specimen radiograph was obtained that showed the clip to be in the center of the specimen. We also took additional anterior and superior margin as well as anterior and inferior margin an additional medial margin. These were all sent separately and the medial margin had a stitch on the true surgical margin. Hemostasis was achieved using the Bovie electrocautery. The wound was irrigated with copious amounts of saline and infiltrated with quarter percent Marcaine. The deep layer the wound was then closed with interrupted 0 Vicryl stitches. The skin  was then closed with interrupted 4 Monocryl subcuticular stitches. Dermabond dressings were applied. The patient tolerated the procedure well. At the end of the case all needle sponge and instrument counts were correct. The patient was then awakened and taken to recovery in stable condition.  PLAN OF CARE: Discharge to home after  PACU  PATIENT DISPOSITION:  PACU - hemodynamically stable.   Delay start of Pharmacological VTE agent (>24hrs) due to surgical blood loss or risk of bleeding: not applicable

## 2012-03-12 NOTE — Interval H&P Note (Signed)
History and Physical Interval Note:  03/12/2012 10:51 AM  [name]  has presented today for surgery, with the diagnosis of left breast cancer  The various methods of treatment have been discussed with the patient and family. After consideration of risks, benefits and other options for treatment, the patient has consented to  Procedure(s) (LRB) with comments: BREAST LUMPECTOMY WITH NEEDLE LOCALIZATION AND AXILLARY SENTINEL LYMPH NODE BX (Left) as a surgical intervention .  The patient's history has been reviewed, patient examined, no change in status, stable for surgery.  I have reviewed the patient's chart and labs.  Questions were answered to the patient's satisfaction.     TOTH III,Gailen Venne S

## 2012-03-12 NOTE — Transfer of Care (Signed)
Immediate Anesthesia Transfer of Care Note  Patient: [name]  Procedure(s) Performed: Procedure(s) (LRB) with comments: BREAST LUMPECTOMY WITH NEEDLE LOCALIZATION AND AXILLARY SENTINEL LYMPH NODE BX (Left)  Patient Location: PACU  Anesthesia Type:General  Level of Consciousness: awake, alert , oriented and patient cooperative  Airway & Oxygen Therapy: Patient Spontanous Breathing and Patient connected to face mask oxygen  Post-op Assessment: Report given to PACU RN, Post -op Vital signs reviewed and stable and Patient moving all extremities  Post vital signs: Reviewed and stable  Complications: No apparent anesthesia complications

## 2012-03-12 NOTE — H&P (View-Only) (Signed)
Subjective:     Patient ID: [name], female   DOB: [birth year], 79 y.o.   MRN: [id number]  HPI We're asked to see the patient in consultation by Dr. Sadie Haber to evaluate her for a left breast cancer. The patient is a 79 year old white female who recently went for a routine screening mammogram at which time a mass was found in the lower inner left breast. This was biopsied and came back as an invasive ductal cancer. It measured 1 cm in maximal dimension by ultrasound. She denies any breast pain. She denies any discharge from her nipple. She has no personal or family history of breast cancers  Review of Systems  Constitutional: Negative.   HENT: Negative.   Eyes: Negative.   Respiratory: Negative.   Cardiovascular: Negative.   Gastrointestinal: Negative.   Genitourinary: Negative.   Musculoskeletal: Negative.   Skin: Negative.   Neurological: Negative.   Hematological: Negative.   Psychiatric/Behavioral: Negative.        Objective:   Physical Exam  Constitutional: She is oriented to person, place, and time. She appears well-developed and well-nourished.  HENT:  Head: Normocephalic and atraumatic.  Eyes: Conjunctivae normal and EOM are normal. Pupils are equal, round, and reactive to light.  Neck: Normal range of motion. Neck supple.  Cardiovascular: Normal rate, regular rhythm and normal heart sounds.   Pulmonary/Chest: Effort normal and breath sounds normal.       There is no palpable mass in either breast. There is no palpable axillary or supraclavicular cervical lymphadenopathy.  Abdominal: Soft. Bowel sounds are normal. She exhibits no mass. There is no tenderness.  Musculoskeletal: Normal range of motion.  Lymphadenopathy:    She has no cervical adenopathy.  Neurological: She is alert and oriented to person, place, and time.  Skin: Skin is warm and dry.  Psychiatric: She has a normal mood and affect. Her behavior is normal.       Assessment:     The patient has what  appears to be a small stage I left breast cancer in the lower inner quadrant. I've discussed with her and her family in detail the different options for treatment. At this point she favors breast conservation. I think this is a very reasonable choice for her. She is also a good candidate for sentinel node mapping. I have discussed with her in detail the risks and benefits of the operation to do this as well as some of the technical aspects and she understands and wishes to proceed. Once we have her pathology back then we will plan to refer her to the medical and radiation oncologist. Her MRI is scheduled for Saturday. If her MRI is similar to the information regarding have than I think we will proceed with this treatment plan.    Plan:     Plan for MRI and then probable left breast wire localized lumpectomy and sentinel node mapping

## 2012-03-12 NOTE — Anesthesia Procedure Notes (Addendum)
Procedure Name: LMA Insertion Date/Time: 03/12/2012 12:22 PM Performed by: Julian Reil Pre-anesthesia Checklist: Patient identified, Emergency Drugs available, Suction available and Patient being monitored Patient Re-evaluated:Patient Re-evaluated prior to inductionOxygen Delivery Method: Circle system utilized Preoxygenation: Pre-oxygenation with 100% oxygen Intubation Type: IV induction LMA: LMA inserted LMA Size: 3.0 Tube type: Oral Number of attempts: 1 Placement Confirmation: positive ETCO2 and breath sounds checked- equal and bilateral Tube secured with: Tape Dental Injury: Teeth and Oropharynx as per pre-operative assessment

## 2012-03-12 NOTE — Anesthesia Postprocedure Evaluation (Signed)
Anesthesia Post Note  Patient: [name]  Procedure(s) Performed: Procedure(s) (LRB): BREAST LUMPECTOMY WITH NEEDLE LOCALIZATION AND AXILLARY SENTINEL LYMPH NODE BX (Left)  Anesthesia type: General  Patient location: PACU  Post pain: Pain level controlled and Adequate analgesia  Post assessment: Post-op Vital signs reviewed, Patient's Cardiovascular Status Stable, Respiratory Function Stable, Patent Airway and Pain level controlled  Last Vitals:  Filed Vitals:   03/12/12 1430  BP: 162/87  Pulse: 79  Temp:   Resp: 12    Post vital signs: Reviewed and stable  Level of consciousness: awake, alert  and oriented  Complications: No apparent anesthesia complications

## 2012-03-12 NOTE — Anesthesia Preprocedure Evaluation (Signed)
Anesthesia Evaluation  Patient identified by MRN, date of birth, ID band Patient awake    Reviewed: Allergy & Precautions, H&P , NPO status , Patient's Chart, lab work & pertinent test results  History of Anesthesia Complications (+) PONV  Airway Mallampati: II  Neck ROM: full    Dental   Pulmonary          Cardiovascular hypertension,     Neuro/Psych    GI/Hepatic GERD-  ,  Endo/Other  diabetes, Type 2  Renal/GU      Musculoskeletal  (+) Arthritis -,   Abdominal   Peds  Hematology   Anesthesia Other Findings   Reproductive/Obstetrics                           Anesthesia Physical Anesthesia Plan  ASA: III  Anesthesia Plan: General   Post-op Pain Management:    Induction: Intravenous  Airway Management Planned: LMA  Additional Equipment:   Intra-op Plan:   Post-operative Plan:   Informed Consent: I have reviewed the patients History and Physical, chart, labs and discussed the procedure including the risks, benefits and alternatives for the proposed anesthesia with the patient or authorized representative who has indicated his/her understanding and acceptance.     Plan Discussed with: CRNA and Surgeon  Anesthesia Plan Comments:         Anesthesia Quick Evaluation

## 2012-03-14 ENCOUNTER — Encounter (HOSPITAL_COMMUNITY): Payer: Self-pay | Admitting: General Surgery

## 2012-03-19 ENCOUNTER — Telehealth (INDEPENDENT_AMBULATORY_CARE_PROVIDER_SITE_OTHER): Payer: Self-pay

## 2012-03-19 NOTE — Telephone Encounter (Signed)
Patient calling in for her pathology results from Breast NL Lumpectomy on 03/12/12.  Please call patients once pathology results have been reviewed.

## 2012-03-20 ENCOUNTER — Other Ambulatory Visit (INDEPENDENT_AMBULATORY_CARE_PROVIDER_SITE_OTHER): Payer: Self-pay | Admitting: General Surgery

## 2012-03-20 ENCOUNTER — Encounter (HOSPITAL_BASED_OUTPATIENT_CLINIC_OR_DEPARTMENT_OTHER): Payer: Self-pay | Admitting: *Deleted

## 2012-03-20 NOTE — Progress Notes (Signed)
Pt had lumpectomy 03/12/12-for re-exc-no labs needed

## 2012-03-22 ENCOUNTER — Encounter (HOSPITAL_BASED_OUTPATIENT_CLINIC_OR_DEPARTMENT_OTHER)
Admission: RE | Disposition: A | Discharge status: Home / Self Care | Payer: Self-pay | Source: Ambulatory Visit | Attending: General Surgery

## 2012-03-22 ENCOUNTER — Encounter (HOSPITAL_BASED_OUTPATIENT_CLINIC_OR_DEPARTMENT_OTHER): Payer: Self-pay | Admitting: Anesthesiology

## 2012-03-22 ENCOUNTER — Encounter (HOSPITAL_BASED_OUTPATIENT_CLINIC_OR_DEPARTMENT_OTHER): Payer: Self-pay

## 2012-03-22 ENCOUNTER — Encounter (HOSPITAL_BASED_OUTPATIENT_CLINIC_OR_DEPARTMENT_OTHER): Payer: Self-pay | Admitting: Certified Registered"

## 2012-03-22 ENCOUNTER — Ambulatory Visit (HOSPITAL_BASED_OUTPATIENT_CLINIC_OR_DEPARTMENT_OTHER)
Admission: RE | Admit: 2012-03-22 | Discharge: 2012-03-22 | Disposition: A | Discharge status: Home / Self Care | Payer: Medicare Other | Source: Ambulatory Visit | Attending: General Surgery | Admitting: General Surgery

## 2012-03-22 ENCOUNTER — Ambulatory Visit (HOSPITAL_BASED_OUTPATIENT_CLINIC_OR_DEPARTMENT_OTHER): Payer: Medicare Other | Admitting: Anesthesiology

## 2012-03-22 DIAGNOSIS — I10 Essential (primary) hypertension: Secondary | ICD-10-CM | POA: Insufficient documentation

## 2012-03-22 DIAGNOSIS — C50319 Malignant neoplasm of lower-inner quadrant of unspecified female breast: Secondary | ICD-10-CM | POA: Insufficient documentation

## 2012-03-22 DIAGNOSIS — E119 Type 2 diabetes mellitus without complications: Secondary | ICD-10-CM | POA: Insufficient documentation

## 2012-03-22 DIAGNOSIS — C50919 Malignant neoplasm of unspecified site of unspecified female breast: Secondary | ICD-10-CM

## 2012-03-22 DIAGNOSIS — D0590 Unspecified type of carcinoma in situ of unspecified breast: Secondary | ICD-10-CM | POA: Insufficient documentation

## 2012-03-22 HISTORY — PX: RE-EXCISION OF BREAST LUMPECTOMY: SHX6048

## 2012-03-22 LAB — GLUCOSE, CAPILLARY: Glucose-Capillary: 163 mg/dL — ABNORMAL HIGH (ref 70–99)

## 2012-03-22 SURGERY — EXCISION, LESION, BREAST
Anesthesia: General | Site: Breast | Laterality: Left | Wound class: Clean

## 2012-03-22 MED ORDER — CHLORHEXIDINE GLUCONATE 4 % EX LIQD: 1.0000 "application " | Freq: Once | CUTANEOUS | Status: DC

## 2012-03-22 MED ORDER — ONDANSETRON HCL 4 MG/2ML IJ SOLN
INTRAMUSCULAR | Status: DC | PRN
  Administered 2012-03-22: 4 mg via INTRAVENOUS

## 2012-03-22 MED ORDER — OXYCODONE HCL 5 MG/5ML PO SOLN: 5.0000 mg | Freq: Once | ORAL | Status: DC | PRN

## 2012-03-22 MED ORDER — CEFAZOLIN SODIUM-DEXTROSE 2-3 GM-% IV SOLR
2.0000 g | INTRAVENOUS | Status: AC
  Administered 2012-03-22: 2 g via INTRAVENOUS

## 2012-03-22 MED ORDER — FENTANYL CITRATE 0.05 MG/ML IJ SOLN
INTRAMUSCULAR | Status: DC | PRN
  Administered 2012-03-22 (×2): 50 ug via INTRAVENOUS

## 2012-03-22 MED ORDER — ACETAMINOPHEN 10 MG/ML IV SOLN
INTRAVENOUS | Status: DC | PRN
  Administered 2012-03-22: 1000 mg via INTRAVENOUS

## 2012-03-22 MED ORDER — HYDROMORPHONE HCL PF 1 MG/ML IJ SOLN
0.2500 mg | INTRAMUSCULAR | Status: DC | PRN
  Administered 2012-03-22: 0.25 mg via INTRAVENOUS

## 2012-03-22 MED ORDER — LIDOCAINE HCL (CARDIAC) 20 MG/ML IV SOLN
INTRAVENOUS | Status: DC | PRN
  Administered 2012-03-22: 60 mg via INTRAVENOUS

## 2012-03-22 MED ORDER — OXYCODONE HCL 5 MG PO TABS: 5.0000 mg | ORAL_TABLET | Freq: Once | ORAL | Status: DC | PRN

## 2012-03-22 MED ORDER — PROPOFOL 10 MG/ML IV BOLUS
INTRAVENOUS | Status: DC | PRN
  Administered 2012-03-22: 155 mg via INTRAVENOUS
  Administered 2012-03-22: 45 mg via INTRAVENOUS

## 2012-03-22 MED ORDER — ATROPINE SULFATE 0.4 MG/ML IJ SOLN
INTRAMUSCULAR | Status: DC | PRN
  Administered 2012-03-22: .1 mg via INTRAVENOUS

## 2012-03-22 MED ORDER — HYDROCODONE-ACETAMINOPHEN 5-325 MG PO TABS: 1.0000 | ORAL_TABLET | Freq: Four times a day (QID) | ORAL | Status: DC | PRN

## 2012-03-22 MED ORDER — EPHEDRINE SULFATE 50 MG/ML IJ SOLN
INTRAMUSCULAR | Status: DC | PRN
  Administered 2012-03-22 (×2): 10 mg via INTRAVENOUS

## 2012-03-22 MED ORDER — MIDAZOLAM HCL 5 MG/5ML IJ SOLN
INTRAMUSCULAR | Status: DC | PRN
  Administered 2012-03-22: 0.5 mg via INTRAVENOUS

## 2012-03-22 MED ORDER — ONDANSETRON HCL 4 MG/2ML IJ SOLN: 4.0000 mg | Freq: Once | INTRAMUSCULAR | Status: DC | PRN

## 2012-03-22 MED ORDER — MIDAZOLAM HCL 2 MG/2ML IJ SOLN: 1.0000 mg | INTRAMUSCULAR | Status: DC | PRN

## 2012-03-22 MED ORDER — LACTATED RINGERS IV SOLN
INTRAVENOUS | Status: DC
  Administered 2012-03-22 (×2): via INTRAVENOUS

## 2012-03-22 MED ORDER — BUPIVACAINE-EPINEPHRINE 0.25% -1:200000 IJ SOLN
INTRAMUSCULAR | Status: DC | PRN
  Administered 2012-03-22: 10 mL

## 2012-03-22 SURGICAL SUPPLY — 46 items
ADH SKN CLS APL DERMABOND .7 (GAUZE/BANDAGES/DRESSINGS) ×2
BLADE SURG 10 STRL SS (BLADE) ×2 IMPLANT
BLADE SURG 15 STRL LF DISP TIS (BLADE) ×1 IMPLANT
BLADE SURG 15 STRL SS (BLADE) ×2
CANISTER SUCTION 1200CC (MISCELLANEOUS) ×2 IMPLANT
CHLORAPREP W/TINT 26ML (MISCELLANEOUS) ×3 IMPLANT
CLIP TI WIDE RED SMALL 6 (CLIP) ×1 IMPLANT
CLOTH BEACON ORANGE TIMEOUT ST (SAFETY) ×2 IMPLANT
COVER MAYO STAND STRL (DRAPES) ×2 IMPLANT
COVER TABLE BACK 60X90 (DRAPES) ×2 IMPLANT
DECANTER SPIKE VIAL GLASS SM (MISCELLANEOUS) ×1 IMPLANT
DERMABOND ADVANCED (GAUZE/BANDAGES/DRESSINGS) ×2
DERMABOND ADVANCED .7 DNX12 (GAUZE/BANDAGES/DRESSINGS) ×1 IMPLANT
DEVICE DUBIN W/COMP PLATE 8390 (MISCELLANEOUS) IMPLANT
DRAPE LAPAROSCOPIC ABDOMINAL (DRAPES) ×2 IMPLANT
DRAPE UTILITY XL STRL (DRAPES) ×2 IMPLANT
ELECT COATED BLADE 2.86 ST (ELECTRODE) ×2 IMPLANT
ELECT REM PT RETURN 9FT ADLT (ELECTROSURGICAL) ×2
ELECTRODE REM PT RTRN 9FT ADLT (ELECTROSURGICAL) ×1 IMPLANT
GLOVE BIO SURGEON STRL SZ7.5 (GLOVE) ×2 IMPLANT
GLOVE BIOGEL PI IND STRL 7.0 (GLOVE) IMPLANT
GLOVE BIOGEL PI INDICATOR 7.0 (GLOVE) ×1
GLOVE ECLIPSE 6.5 STRL STRAW (GLOVE) ×1 IMPLANT
GLOVE ECLIPSE 7.0 STRL STRAW (GLOVE) ×1 IMPLANT
GLOVE SKINSENSE NS SZ6.5 (GLOVE) ×1
GLOVE SKINSENSE STRL SZ6.5 (GLOVE) IMPLANT
GOWN PREVENTION PLUS XLARGE (GOWN DISPOSABLE) ×4 IMPLANT
NDL HYPO 25X1 1.5 SAFETY (NEEDLE) ×1 IMPLANT
NEEDLE HYPO 25X1 1.5 SAFETY (NEEDLE) ×2 IMPLANT
NS IRRIG 1000ML POUR BTL (IV SOLUTION) ×2 IMPLANT
PACK BASIN DAY SURGERY FS (CUSTOM PROCEDURE TRAY) ×2 IMPLANT
PENCIL BUTTON HOLSTER BLD 10FT (ELECTRODE) ×2 IMPLANT
SLEEVE SCD COMPRESS KNEE MED (MISCELLANEOUS) ×2 IMPLANT
SPONGE LAP 18X18 X RAY DECT (DISPOSABLE) ×2 IMPLANT
STAPLER VISISTAT 35W (STAPLE) IMPLANT
SUT MON AB 4-0 PC3 18 (SUTURE) ×2 IMPLANT
SUT SILK 2 0 SH (SUTURE) ×2 IMPLANT
SUT VIC AB 3-0 54X BRD REEL (SUTURE) IMPLANT
SUT VIC AB 3-0 BRD 54 (SUTURE)
SUT VICRYL 3-0 CR8 SH (SUTURE) ×2 IMPLANT
SYR CONTROL 10ML LL (SYRINGE) ×2 IMPLANT
TOWEL OR 17X24 6PK STRL BLUE (TOWEL DISPOSABLE) ×2 IMPLANT
TOWEL OR NON WOVEN STRL DISP B (DISPOSABLE) ×2 IMPLANT
TUBE CONNECTING 20X1/4 (TUBING) ×2 IMPLANT
WATER STERILE IRR 1000ML POUR (IV SOLUTION) ×1 IMPLANT
YANKAUER SUCT BULB TIP NO VENT (SUCTIONS) ×2 IMPLANT

## 2012-03-22 NOTE — Anesthesia Preprocedure Evaluation (Addendum)
Anesthesia Evaluation  Patient identified by MRN, date of birth, ID band Patient awake    Reviewed: Allergy & Precautions, H&P , NPO status , Patient's Chart, lab work & pertinent test results, reviewed documented beta blocker date and time   History of Anesthesia Complications (+) PONV  Airway Mallampati: I TM Distance: >3 FB Neck ROM: Full    Dental  (+) Teeth Intact and Dental Advisory Given   Pulmonary  breath sounds clear to auscultation        Cardiovascular hypertension, Pt. on medications Rhythm:Regular Rate:Normal     Neuro/Psych    GI/Hepatic GERD-  Controlled,  Endo/Other  diabetes, Well Controlled, Oral Hypoglycemic Agents  Renal/GU      Musculoskeletal   Abdominal   Peds  Hematology   Anesthesia Other Findings Small mouth.  Reproductive/Obstetrics                         Anesthesia Physical Anesthesia Plan  ASA: III  Anesthesia Plan: General   Post-op Pain Management:    Induction: Intravenous  Airway Management Planned: LMA  Additional Equipment:   Intra-op Plan:   Post-operative Plan:   Informed Consent: I have reviewed the patients History and Physical, chart, labs and discussed the procedure including the risks, benefits and alternatives for the proposed anesthesia with the patient or authorized representative who has indicated his/her understanding and acceptance.   Dental advisory given  Plan Discussed with: CRNA, Anesthesiologist and Surgeon  Anesthesia Plan Comments:         Anesthesia Quick Evaluation

## 2012-03-22 NOTE — Anesthesia Postprocedure Evaluation (Signed)
  Anesthesia Post-op Note  Patient: [name]  Procedure(s) Performed: Procedure(s) (LRB) with comments: RE-EXCISION OF BREAST LUMPECTOMY (Left) - re-excision left breast lumpectomy cavity   Patient Location: PACU  Anesthesia Type:General  Level of Consciousness: awake and alert   Airway and Oxygen Therapy: Patient Spontanous Breathing and Patient connected to face mask oxygen  Post-op Pain: mild  Post-op Assessment: Post-op Vital signs reviewed  Post-op Vital Signs: Reviewed  Complications: No apparent anesthesia complications

## 2012-03-22 NOTE — Transfer of Care (Signed)
Immediate Anesthesia Transfer of Care Note  Patient: [name]  Procedure(s) Performed: Procedure(s) (LRB) with comments: RE-EXCISION OF BREAST LUMPECTOMY (Left) - re-excision left breast lumpectomy cavity   Patient Location: PACU  Anesthesia Type:General  Level of Consciousness: sedated and unresponsive  Airway & Oxygen Therapy: Patient Spontanous Breathing and Patient connected to face mask oxygen  Post-op Assessment: Report given to PACU RN and Post -op Vital signs reviewed and stable  Post vital signs: Reviewed and stable  Complications: No apparent anesthesia complications

## 2012-03-22 NOTE — Anesthesia Procedure Notes (Signed)
Procedure Name: LMA Insertion Date/Time: 03/22/2012 9:45 AM Performed by: Denny Levy Pre-anesthesia Checklist: Patient identified, Emergency Drugs available, Suction available, Patient being monitored and Timeout performed Patient Re-evaluated:Patient Re-evaluated prior to inductionOxygen Delivery Method: Circle System Utilized Preoxygenation: Pre-oxygenation with 100% oxygen Intubation Type: IV induction Ventilation: Mask ventilation without difficulty LMA: LMA inserted LMA Size: 4.0 Number of attempts: 1 Airway Equipment and Method: bite block Placement Confirmation: positive ETCO2 Tube secured with: Tape Dental Injury: Teeth and Oropharynx as per pre-operative assessment

## 2012-03-22 NOTE — H&P (Signed)
[name]  Description:  79 year old female  02/19/2012 8:30 AM Office Visit Provider:  Merrie Roof, MD  MRN: [id number] Department:  [facility]            Diagnoses  Reason for Visit    Breast cancer - Primary  Breast Cancer   174.9  eval lt breast           Vitals - Last Recorded       BP  Pulse  Temp  Resp  Ht  Wt    158/86  64  97.3 F (36.3 C) (Temporal)  16  5\' 2"  (1.575 m)  183 lb (83.008 kg)          BMI               33.47 kg/m2                   Progress Notes     Merrie Roof, MD 02/19/2012 9:53 AM Signed    Subjective:    Patient ID: [name], female DOB: 09/10/1933, 79 y.o. MRN: [id number]  HPI  We're asked to see the patient in consultation by Dr. Sadie Haber to evaluate her for a left breast cancer. The patient is a 79 year old white female who recently went for a routine screening mammogram at which time a mass was found in the lower inner left breast. This was biopsied and came back as an invasive ductal cancer. It measured 1 cm in maximal dimension by ultrasound. She denies any breast pain. She denies any discharge from her nipple. She has no personal or family history of breast cancers  Review of Systems  Constitutional: Negative.  HENT: Negative.  Eyes: Negative.  Respiratory: Negative.  Cardiovascular: Negative.  Gastrointestinal: Negative.  Genitourinary: Negative.  Musculoskeletal: Negative.  Skin: Negative.  Neurological: Negative.  Hematological: Negative.  Psychiatric/Behavioral: Negative.      Objective:    Physical Exam  Constitutional: She is oriented to person, place, and time. She appears well-developed and well-nourished.  HENT:  Head: Normocephalic and atraumatic.  Eyes: Conjunctivae normal and EOM are normal. Pupils are equal, round, and reactive to light.  Neck: Normal range of motion. Neck supple.  Cardiovascular: Normal rate, regular rhythm and normal heart sounds.    Pulmonary/Chest: Effort normal and breath sounds normal.  There is no palpable mass in either breast. There is no palpable axillary or supraclavicular cervical lymphadenopathy.  Abdominal: Soft. Bowel sounds are normal. She exhibits no mass. There is no tenderness.  Musculoskeletal: Normal range of motion.  Lymphadenopathy:  She has no cervical adenopathy.  Neurological: She is alert and oriented to person, place, and time.  Skin: Skin is warm and dry.  Psychiatric: She has a normal mood and affect. Her behavior is normal.      Assessment:     The patient has what appears to be a small stage I left breast cancer in the lower inner quadrant. I've discussed with her and her family in detail the different options for treatment. At this point she favors breast conservation. I think this is a very reasonable choice for her. She is also a good candidate for sentinel node mapping. I have discussed with her in detail the risks and benefits of the operation to do this as well as some of the technical aspects and she understands and wishes to proceed. Once we have her pathology back  then we will plan to refer her to the medical and radiation oncologist. Her MRI is scheduled for Saturday. If her MRI is similar to the information regarding have than I think we will proceed with this treatment plan.     Plan:     Plan for MRI and then probable left breast wire localized lumpectomy and sentinel node mapping              Not recorded                            Patient Instructions     If MRI is the same then plan for left breast wire localized lumpectomy and sentinel node mapping          Level of Service     PR OFFICE CONSULTATION NEW/ESTAB PATIENT 60 MIN [id number]           All Flowsheet Templates (all recorded)     Encounter Vitals [place]   Anthropometrics Flowsheet                           Referring Provider           Alonza Bogus, MD            All Charges for This Encounter       Code  Description  Service Date  Service Provider  Modifiers  Quantity    [id number]  PR OFFICE OUTPATIENT NEW 29 MINUTES  02/19/2012  Merrie Roof, MD   1                Other Encounter Related Information     Allergies & Medications      Problem List      History      Patient-Entered Questionnaires      Printed AVS Reports       Printed at    Printed by    02/19/2012 9:19 AM  After Visit Summary  Merrie Roof, MD           No data filed

## 2012-03-22 NOTE — Interval H&P Note (Signed)
History and Physical Interval Note:  03/22/2012 9:22 AM  [name]  has presented today for surgery, with the diagnosis of left breast cancer with postitive margins   The various methods of treatment have been discussed with the patient and family. After consideration of risks, benefits and other options for treatment, the patient has consented to  Procedure(s) (LRB) with comments: RE-EXCISION OF BREAST LUMPECTOMY (Left) - re excision left breast lumpectomy cavity  as a surgical intervention .  The patient's history has been reviewed, patient examined, no change in status, stable for surgery.  I have reviewed the patient's chart and labs.  Questions were answered to the patient's satisfaction.     TOTH III,Ryson Bacha S

## 2012-03-22 NOTE — Op Note (Addendum)
03/22/2012  10:45 AM  PATIENT:  [name]  79 y.o. female  PRE-OPERATIVE DIAGNOSIS:  left breast cancer with postitive margins   POST-OPERATIVE DIAGNOSIS:  left breast cancer with postitive margins   PROCEDURE:  Procedure(s) (LRB) with comments: RE-EXCISION OF BREAST LUMPECTOMY (Left) - re-excision left breast lumpectomy cavity   SURGEON:  Surgeon(s) and Role:    * Merrie Roof, MD - Primary  PHYSICIAN ASSISTANT:   ASSISTANTS: none   ANESTHESIA:   general  EBL:  Total I/O In: 1200 [I.V.:1200] Out: -   BLOOD ADMINISTERED:none  DRAINS: none   LOCAL MEDICATIONS USED:  MARCAINE     SPECIMEN:  Source of Specimen:  left breast tissue  DISPOSITION OF SPECIMEN:  PATHOLOGY  COUNTS:  YES  TOURNIQUET:  * No tourniquets in log *  DICTATION: .Dragon Dictation After informed consent was obtained patient was brought to the operating room and placed in the supine position on the operating table. After adequate induction of general anesthesia the patient's left breast was prepped with ChloraPrep, allowed to dry, and draped in usual sterile manner. An elliptical incision was made with a 15 blade knife around her old incision. This incision was carried through the skin and subcutaneous tissue sharply with electrocautery until the previous lumpectomy cavity was entered. The specimen was sent as the new anterior margin. The rest of the lumpectomy cavity was then excised sharply with the electrocautery down to the chest wall. The cavity was then marked with a short double stitch on the superior margin a long double stitch on the lateral margin and a single short stitch on the new deep margin. Hemostasis was achieved using Bovie electrocautery. The wound was infiltrated with quarter percent Marcaine and irrigated with copious amounts of saline. The deep layer the wound was then closed with interrupted 0 Vicryl stitches. The skin was closed with interrupted 4-0 Monocryl subcuticular  stitches. Dermabond dressings were applied. The patient tolerated the procedure well. At the end of the case all needle sponge and instrument counts were correct. The patient was then awakened and taken to recovery in stable condition. The remaining lumpectomy cavity was marked with small clips  PLAN OF CARE: Discharge to home after PACU  PATIENT DISPOSITION:  PACU - hemodynamically stable.   Delay start of Pharmacological VTE agent (>24hrs) due to surgical blood loss or risk of bleeding: not applicable

## 2012-03-25 ENCOUNTER — Encounter (HOSPITAL_BASED_OUTPATIENT_CLINIC_OR_DEPARTMENT_OTHER): Payer: Self-pay | Admitting: General Surgery

## 2012-04-02 ENCOUNTER — Ambulatory Visit (INDEPENDENT_AMBULATORY_CARE_PROVIDER_SITE_OTHER): Payer: Medicare Other | Admitting: General Surgery

## 2012-04-02 ENCOUNTER — Telehealth (INDEPENDENT_AMBULATORY_CARE_PROVIDER_SITE_OTHER): Payer: Self-pay | Admitting: General Surgery

## 2012-04-02 ENCOUNTER — Encounter (INDEPENDENT_AMBULATORY_CARE_PROVIDER_SITE_OTHER): Payer: Self-pay | Admitting: General Surgery

## 2012-04-02 VITALS — BP 130/78 | HR 85 | Temp 97.2°F | Resp 18 | Ht 62.0 in | Wt 176.8 lb

## 2012-04-02 DIAGNOSIS — C50919 Malignant neoplasm of unspecified site of unspecified female breast: Secondary | ICD-10-CM

## 2012-04-02 MED ORDER — DOXYCYCLINE HYCLATE 100 MG PO TABS: 100.0000 mg | ORAL_TABLET | Freq: Two times a day (BID) | ORAL | Status: DC

## 2012-04-02 NOTE — Patient Instructions (Signed)
Take doxycycline for 1 week. Refill it if the redness is not totally gone

## 2012-04-02 NOTE — Telephone Encounter (Signed)
I left message with Ms Gentilini on voicemail that [facility] in [place] would be calling her on Thursday 04/04/12 with appt .

## 2012-04-04 NOTE — Progress Notes (Signed)
Subjective:     Patient ID: Algis Liming, female   DOB: [birth year], 79 y.o.   MRN: [id number]  HPI The patient is a 79 year old white female who is about 3 weeks out from a left breast lumpectomy and negative sentinel node biopsy for a T1 B. N0 left breast cancer. She did have to go back to have her margins reexcised. She now has one focal close margin but all the rest of her margins are clean. She is tolerated the surgery well and has no complaints today.  Review of Systems     Objective:   Physical Exam On exam her left breast incision is healing nicely. There is a slight hint of redness around the incision but no pain.    Assessment:     77 weeks status post left breast lumpectomy for breast cancer    Plan:     At this point I think her margins are acceptably clean. We will refer her to the medical and radiation oncologist. I will also place her on a week of doxycycline for the redness. Will plan to see her back in one month to check her progress.

## 2012-04-08 ENCOUNTER — Encounter: Payer: Self-pay | Admitting: Internal Medicine

## 2012-04-18 DIAGNOSIS — M949 Disorder of cartilage, unspecified: Secondary | ICD-10-CM

## 2012-04-18 DIAGNOSIS — M899 Disorder of bone, unspecified: Secondary | ICD-10-CM

## 2012-04-18 DIAGNOSIS — N61 Mastitis without abscess: Secondary | ICD-10-CM

## 2012-04-18 DIAGNOSIS — C50319 Malignant neoplasm of lower-inner quadrant of unspecified female breast: Secondary | ICD-10-CM

## 2012-04-23 ENCOUNTER — Ambulatory Visit (INDEPENDENT_AMBULATORY_CARE_PROVIDER_SITE_OTHER): Payer: Medicare Other | Admitting: General Surgery

## 2012-04-23 ENCOUNTER — Encounter (INDEPENDENT_AMBULATORY_CARE_PROVIDER_SITE_OTHER): Payer: Self-pay | Admitting: General Surgery

## 2012-04-23 VITALS — BP 132/78 | HR 80 | Temp 97.6°F | Resp 14 | Ht 62.0 in | Wt 178.4 lb

## 2012-04-23 DIAGNOSIS — C50912 Malignant neoplasm of unspecified site of left female breast: Secondary | ICD-10-CM

## 2012-04-23 DIAGNOSIS — C50919 Malignant neoplasm of unspecified site of unspecified female breast: Secondary | ICD-10-CM

## 2012-04-23 NOTE — Patient Instructions (Signed)
Will get second opinion from Dr. Valere Dross

## 2012-04-24 NOTE — Progress Notes (Signed)
Subjective:     Patient ID: [name], female   DOB: 02-14-1934, 79 y.o.   MRN: [id number]  HPI The patient is a 79 year old white female who is 6 weeks status post left breast lumpectomy and negative sentinel node biopsy for a T1 B. N0 left breast cancer. She states that she is concerned that her radiation oncologist has not recommended radiation therapy. She otherwise feels well and does not complain of any pain.  Review of Systems     Objective:   Physical Exam On exam her left breast and axillary incisions are healing nicely. There is no sign of infection.    Assessment:     The patient is 6 weeks status post left breast lumpectomy for breast cancer     Plan:     At this point she would like to get a second opinion from a radiation oncologist at the [facility]. We will arrange for this appointment and plan to see her back in about 3 months. She will continue to do regular self exams.

## 2012-04-26 ENCOUNTER — Encounter: Payer: Self-pay | Admitting: Oncology

## 2012-04-30 ENCOUNTER — Ambulatory Visit: Payer: Medicare Other | Admitting: Radiation Oncology

## 2012-04-30 ENCOUNTER — Encounter: Payer: Self-pay | Admitting: Internal Medicine

## 2012-04-30 ENCOUNTER — Ambulatory Visit: Payer: Medicare Other

## 2012-05-03 DIAGNOSIS — N189 Chronic kidney disease, unspecified: Secondary | ICD-10-CM | POA: Insufficient documentation

## 2012-05-03 DIAGNOSIS — C4490 Unspecified malignant neoplasm of skin, unspecified: Secondary | ICD-10-CM | POA: Insufficient documentation

## 2012-05-03 DIAGNOSIS — E785 Hyperlipidemia, unspecified: Secondary | ICD-10-CM | POA: Insufficient documentation

## 2012-05-03 DIAGNOSIS — K219 Gastro-esophageal reflux disease without esophagitis: Secondary | ICD-10-CM | POA: Insufficient documentation

## 2012-05-03 DIAGNOSIS — I10 Essential (primary) hypertension: Secondary | ICD-10-CM | POA: Insufficient documentation

## 2012-05-06 ENCOUNTER — Encounter: Payer: Self-pay | Admitting: Oncology

## 2012-05-07 ENCOUNTER — Ambulatory Visit
Admission: RE | Admit: 2012-05-07 | Discharge: 2012-05-07 | Disposition: A | Discharge status: Home / Self Care | Payer: Medicare Other | Source: Ambulatory Visit | Attending: Radiation Oncology | Admitting: Radiation Oncology

## 2012-05-07 ENCOUNTER — Encounter: Payer: Self-pay | Admitting: Radiation Oncology

## 2012-05-07 VITALS — BP 171/91 | HR 89 | Temp 97.7°F | Resp 20 | Ht 60.0 in | Wt 184.6 lb

## 2012-05-07 DIAGNOSIS — C50912 Malignant neoplasm of unspecified site of left female breast: Secondary | ICD-10-CM

## 2012-05-07 DIAGNOSIS — C50919 Malignant neoplasm of unspecified site of unspecified female breast: Secondary | ICD-10-CM | POA: Insufficient documentation

## 2012-05-07 HISTORY — DX: Unspecified cataract: H26.9

## 2012-05-07 NOTE — Progress Notes (Signed)
CC: Dr. Autumn Messing III, Dr. Gatha Mayer, Dr. 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 Autumn Messing III for evaluation of her T1 N0 invasive ductal/DCIS of the left breast. She was noted to have a mass in the left lower inner quadrant of the left breast on the screening mammogram from 01/15/2012. On ultrasound this measured 1.0 x 0.7 x 0.8 cm at 8:00, 4 cm from the nipple. She was seen by Dr. Marlou Starks who performed a left partial mastectomy and sentinel lymph node biopsy on 03/12/2012. She is found to have a 0.9 cm invasive ductal carcinoma along with DCIS. Invasive tumor involved the cauterized anterior margin, and DCIS was seen 1 mm from the surgical margin. Her invasive disease was strongly ER positive at 100% and PR positive at 98%. HER-2/neu was non-amplified and Ki-67 was 15%. She underwent reexcision on 08/20/2012 and there was a single residual focus of intermediate ductal carcinoma in situ spanning 0.15 cm with a focus of DCIS less than 0.1 cm from the lateral inked margin. She has been seen by Dr. Orlene Erm of radiation oncology at [facility] and also Dr. Audree Camel of medical oncology at [facility]. Dr. Orlene Erm felt that it would be reasonable for her to undergo adjuvant endocrine therapy alone. Her medical comorbidities include insulin-dependent diabetes mellitus. Of note is that her paternal grandmother lived to be 27. She does have chronic low back pain and has had 3-D spine fusions with Dr. Vertell Limber. She currently has radicular right lower extremity pain and expects to see Dr. Vertell Limber in the near future.  Physical examination: Alert and oriented 79 year old white female appearing her stated age. Wt Readings from Last 3 Encounters:  05/07/12 184 lb 9.6 oz (83.734 kg)  04/23/12 178 lb 6.4 oz (80.922 kg)  04/02/12 176 lb 12.8 oz (80.196 kg)    Temp Readings from Last 3 Encounters:  05/07/12 97.7 F (36.5 C) Oral  04/23/12 97.6 F (36.4 C)   04/02/12 97.2 F (36.2 C) Temporal   BP Readings from Last 3 Encounters:  05/07/12 171/91  04/23/12 132/78  04/02/12 130/78   Pulse Readings from Last 3 Encounters:  05/07/12 89  04/23/12 80  04/02/12 85   Head and neck examination: Grossly unremarkable. Nodes: Without palpable cervical, supraclavicular, or axillary lymphadenopathy. Chest: Lungs clear. Back: Without spinal or CVA tenderness. Heart: Regular rate rhythm. Breasts: There is a partial mastectomy wound along the inferior aspect of the left breast centered at approximately 7:00. The nipple is slightly inverted. There is the usual degree of underlying induration or perhaps a small seroma/hematoma deep to the wound. Right breast without masses or lesions. Abdomen without hepatomegaly. Extremities: without upper extremity edema. She has a brace on her left ankle.  Laboratory data: Lab Results  Component Value Date   WBC 8.4 03/12/2012   HGB 12.6 03/22/2012   HCT 35.0* 03/12/2012   MCV 87.9 03/12/2012   PLT 207 03/12/2012   Impression: Stage I (T1, N0, M0) invasive ductal/DCIS of the left breast. With respect to her invasive disease, her management options include either adjuvant endocrine therapy alone or breast radiation with or without adjuvant endocrine therapy. There is better local control with the addition of radiation therapy, but there is no impact on overall survival or need for mastectomy based on the over 70 adjuvant trial where all  patients received adjuvant tamoxifen with or without radiation therapy. On other hand, if she is not felt to be a candidate for an aromatase inhibitor, then one could argue that radiation therapy could be a substitute , and may provide superior local control. With respect to her less than 1 mm margin for her  DCIS, this is considered less than ideal based on an NCCN guidelines, and one could  argue for reexcision. However, from a practical standpoint this lady is not going to die from metastatic breast cancer although she may be at increased risk for a local recurrence. Of note is that there were no malignant type calcifications seen on mammography to recommend a repeat mammogram to confirm removal of all DCIS. I explained the patient and her family that adjuvant hormone therapy would certainly be reasonable in this situation, but she may be offered better local control with adjuvant radiation therapy as well.  If she wants to avoid the potential side effects of an aromatase inhibitor, then radiation therapy alone would also be certainly reasonable in terms of decreasing a local recurrence for either invasive disease or DCIS. I do not feel strongly that she needs reexcision. I feel that her insulin dependent diabetes mellitus is more of a significant problem than her breast cancer. She'll visit Dr. Jacquiline Doe and then make a decision regarding her adjuvant therapy. I also told her that she can receive radiation therapy with Dr. Orlene Erm closer to home at the [facility] in [place], [place].  45 minutes was spent face-to-face with the patient, primarily counseling the patient along with her family.

## 2012-05-07 NOTE — Progress Notes (Signed)
Please see the Nurse Progress Note in the MD Initial Consult Encounter for this patient. 

## 2012-05-07 NOTE — Progress Notes (Signed)
New Consult Left Breast Cancer S/P LUMPECTOMY 124/14 ALERT,ORIENTED X3,  Married, 1 deceased son, 1 daughter living, no family history of Cancer No c/o breat pain, has back pain  Left breast incision well healed, under nipple area Here for 22nd opinion whether to have radiation or not  No History Radiation No History of a Pacemaker  Allergies:Demerol, bee stings

## 2012-05-21 ENCOUNTER — Encounter: Payer: Medicare Other | Admitting: Internal Medicine

## 2012-05-21 DIAGNOSIS — C50319 Malignant neoplasm of lower-inner quadrant of unspecified female breast: Secondary | ICD-10-CM

## 2012-05-23 ENCOUNTER — Other Ambulatory Visit: Payer: Self-pay | Admitting: Neurosurgery

## 2012-05-23 DIAGNOSIS — M5137 Other intervertebral disc degeneration, lumbosacral region: Secondary | ICD-10-CM

## 2012-05-27 ENCOUNTER — Ambulatory Visit
Admission: RE | Admit: 2012-05-27 | Discharge: 2012-05-27 | Disposition: A | Discharge status: Home / Self Care | Payer: Medicare Other | Source: Ambulatory Visit | Attending: Neurosurgery | Admitting: Neurosurgery

## 2012-05-27 DIAGNOSIS — M5137 Other intervertebral disc degeneration, lumbosacral region: Secondary | ICD-10-CM

## 2012-05-27 MED ORDER — GADOBENATE DIMEGLUMINE 529 MG/ML IV SOLN
15.0000 mL | Freq: Once | INTRAVENOUS | Status: AC | PRN
  Administered 2012-05-27: 15 mL via INTRAVENOUS

## 2012-05-29 ENCOUNTER — Other Ambulatory Visit: Payer: Self-pay | Admitting: Neurosurgery

## 2012-05-30 ENCOUNTER — Other Ambulatory Visit: Payer: Self-pay

## 2012-06-03 ENCOUNTER — Encounter (HOSPITAL_COMMUNITY): Payer: Self-pay | Admitting: Pharmacy Technician

## 2012-06-10 NOTE — Pre-Procedure Instructions (Signed)
ZAMYAH LELLO  06/10/2012   Your procedure is scheduled on:  April 22  Report to [place] at 08:15 AM.  Call this number if you have problems the morning of surgery: [phone]   Remember:   Do not eat food or drink liquids after midnight.   Take these medicines the morning of surgery with A SIP OF WATER: Amlodipine, Cymbalta, Neurontin, Hydralazine, Hydrocodone (if needed), Metoprolol, Eye drops, Omeprazole, Arimidex   STOP Aspirin and calcium after today  Do not wear jewelry, make-up or nail polish.  Do not wear lotions, powders, or perfumes. You may wear deodorant.  Do not shave 48 hours prior to surgery. Men may shave face and neck.  Do not bring valuables to the hospital.  Contacts, dentures or bridgework may not be worn into surgery.  Leave suitcase in the car. After surgery it may be brought to your room.  For patients admitted to the hospital, checkout time is 11:00 AM the day of  discharge.   Special Instructions: Shower using CHG 2 nights before surgery and the night before surgery.  If you shower the day of surgery use CHG.  Use special wash - you have one bottle of CHG for all showers.  You should use approximately 1/3 of the bottle for each shower.   Please read over the following fact sheets that you were given: Pain Booklet, Coughing and Deep Breathing, Blood Transfusion Information and Surgical Site Infection Prevention

## 2012-06-11 ENCOUNTER — Encounter (HOSPITAL_COMMUNITY)
Admission: RE | Admit: 2012-06-11 | Discharge: 2012-06-11 | Disposition: A | Discharge status: Home / Self Care | Payer: Medicare Other | Source: Ambulatory Visit | Attending: Neurosurgery | Admitting: Neurosurgery

## 2012-06-11 ENCOUNTER — Encounter (HOSPITAL_COMMUNITY): Payer: Self-pay

## 2012-06-11 DIAGNOSIS — Z01812 Encounter for preprocedural laboratory examination: Secondary | ICD-10-CM | POA: Insufficient documentation

## 2012-06-11 DIAGNOSIS — Z01818 Encounter for other preprocedural examination: Secondary | ICD-10-CM | POA: Insufficient documentation

## 2012-06-11 HISTORY — DX: Other seasonal allergic rhinitis: J30.2

## 2012-06-11 LAB — BASIC METABOLIC PANEL
CO2: 26 mEq/L (ref 19–32)
Calcium: 9.3 mg/dL (ref 8.4–10.5)
Chloride: 103 mEq/L (ref 96–112)
Creatinine, Ser: 0.73 mg/dL (ref 0.50–1.10)
Glucose, Bld: 176 mg/dL — ABNORMAL HIGH (ref 70–99)
Sodium: 139 mEq/L (ref 135–145)

## 2012-06-11 LAB — CBC
HCT: 32.9 % — ABNORMAL LOW (ref 36.0–46.0)
MCH: 27.9 pg (ref 26.0–34.0)
MCV: 87.5 fL (ref 78.0–100.0)
Platelets: 178 10*3/uL (ref 150–400)
RBC: 3.76 MIL/uL — ABNORMAL LOW (ref 3.87–5.11)

## 2012-06-11 LAB — SURGICAL PCR SCREEN
MRSA, PCR: NEGATIVE
Staphylococcus aureus: NEGATIVE

## 2012-06-26 MED ORDER — CEFAZOLIN SODIUM-DEXTROSE 2-3 GM-% IV SOLR
2.0000 g | INTRAVENOUS | Status: AC
  Administered 2012-06-27: 2 g via INTRAVENOUS
  Filled 2012-06-26: qty 50

## 2012-06-26 NOTE — H&P (Signed)
[name]. [name]  [id number] DOB:  01/09/1934   05/27/2012:     [name] returns today to review her MRI which shows that she has fairly severe right lateral recess stenosis and retrolisthesis of L5 on S1 which is causing significant right-sided nerve root compression.  She says her right leg is bothering her a great deal.  She has had previous laminectomy but has not had a fusion at this level.  I believe that in order to adequately decompress and stabilize her nerves at this level, she is going to require a re-do decompression with interbody and/or posterolateral arthrodesis.  She will need a complete facetectomy in order to gain access to the nerves and decompress them adequately. I do not think this can be done with an anterior approach.  I would expect that we would need to cut the previous rods and extend the screws and rods down to the L5-S1 level.  While she does have disc degeneration at the L1-2 level, I do not believe this is a significant problem for her currently and do not think anything surgical needs to be done at that level.  I think this needs to be done from a posterior approach.  We will get a record of her previous hardware and proceed with surgery.   She and her family wish to proceed.  We are going to fit her for an LSO brace and plan on moving ahead with proposed surgery in the near future.  We went over the surgery in great detail today as well as teaching with Verdis Prime.   We went over the surgical procedure with the patient in great detail today.  I went over the diagnostic studies, as well as surgical models.  We discussed the potential risks and benefits of the surgery, as well as expected hospital course.  The patient would generally wear a brace for three months after surgery.  The risks include, but are not limited to, bleeding, possible need for transfusion, infection, damage to nerves and vessels, risks of anesthesia, dural tear, injury to lumbar nerve roots causing  either temporary or permanent leg pain, numbness or weakness, malpositioning of instrumentation, fusion failure, failure to relieve pain, back pain after surgery, recurrent disc herniation, worsening of pain and adjacent degeneration after a spinal fusion.  Also, the potential for the need for further surgery in the event of incomplete fusion.  We also discussed the risks of injury to abdominal structures including bowel, iliac artery or vein injury.            [name]. [name], M.D./gde  cc: Dr. Sinda Du      NEUROSURGICAL CONSULTATION  [name]. [name]  [id number] DOB:  1933/10/18  April 17, 2011  HISTORY:     [name] previously saw Dr. Sherwood Gambler for low back pain in 2007.  She subsequently had lumbar fusion by Dr. Louanne Skye in 2008 and then again had another fusion by Dr. Louanne Skye in 2010.  This is the most recent of four back surgeries.  She says she fell last month in her yard and fell catching herself with her hand and felt a pop in her back.  Since that time, she has had back pain and has had no positions of comfort since then.  She notes a bulge on her left side and had a CT scan last Monday which I reviewed which shows some obesity with bulging of her abdominal musculature without apparent hernia.  She has been taking Hydrocodone which ended  after Celebrex began and we started a few weeks ago after her fall t.i.d.  She also has been taking Celebrex 200 mg.  She is taking Cymbalta 60 mg. q.d. and Gabapentin 100 mg. q.d. which were prescribed by Dr. Luan Pulling.  She currently describes that she cannot walk straight.  She has right-sided low back pain and cannot stand for any length of time and her legs are weak and wobbly.    PAST MEDICAL HISTORY:      Current Medical Conditions:    As previously described.      Prior Operations and Hospitalizations:   As previously described.      Medications and Allergies:  As previously described.     DIAGNOSTIC STUDIES:   As previously described.   Lumbar radiographs were reviewed which show prior lumbar fusion L2-L5 levels with what appears to be significant degeneration at the L1-2 level and also at the L5-S1 level.  She appears to have solid arthrodesis at the previously operated levels.  She had a prior fusion L3-L5 and then subsequently had L2  through L3 extension and now has significant structural pathology at both the L1-2 and L5-S1 levels.    PHYSICAL EXAMINATION:  On examination today, she appears to have full strength on confrontational testing.  She is obese.  She walks with a flexed attitude.  She has increased pain with extension.  She has symmetric reflexes in the lower extremities.    IMPRESSION AND RECOMMENDATIONS:  In light of [name]'s multiple prior surgeries and significant pain complaints, I recommended that she undergo lumbar myelogram  and post myelogram CT scan to better clarify the exact nature of structural pathology and whether she has indeed healed at the sites of previous surgery and also the extent of degenerative changes in the L5-S1 level.  Clearly there is significant kyphosis at L1-2.     [facility]    [name]. [name], M.D.  JDS:gde  cc: Dr. Sinda Du

## 2012-06-27 ENCOUNTER — Inpatient Hospital Stay (HOSPITAL_COMMUNITY)
Admission: RE | Admit: 2012-06-27 | Discharge: 2012-07-01 | DRG: 460 | Disposition: A | Discharge status: Home Health | Payer: Medicare Other | Source: Ambulatory Visit | Attending: Neurosurgery | Admitting: Neurosurgery

## 2012-06-27 ENCOUNTER — Inpatient Hospital Stay (HOSPITAL_COMMUNITY): Payer: Medicare Other | Admitting: Certified Registered"

## 2012-06-27 ENCOUNTER — Encounter (HOSPITAL_COMMUNITY)
Admission: RE | Disposition: A | Discharge status: Home Health | Payer: Self-pay | Source: Ambulatory Visit | Attending: Neurosurgery

## 2012-06-27 ENCOUNTER — Encounter (HOSPITAL_COMMUNITY): Payer: Self-pay | Admitting: Certified Registered"

## 2012-06-27 ENCOUNTER — Encounter (HOSPITAL_COMMUNITY): Payer: Self-pay | Admitting: *Deleted

## 2012-06-27 ENCOUNTER — Inpatient Hospital Stay (HOSPITAL_COMMUNITY): Payer: Medicare Other

## 2012-06-27 DIAGNOSIS — Z794 Long term (current) use of insulin: Secondary | ICD-10-CM

## 2012-06-27 DIAGNOSIS — E785 Hyperlipidemia, unspecified: Secondary | ICD-10-CM | POA: Diagnosis present

## 2012-06-27 DIAGNOSIS — E119 Type 2 diabetes mellitus without complications: Secondary | ICD-10-CM | POA: Diagnosis present

## 2012-06-27 DIAGNOSIS — M51379 Other intervertebral disc degeneration, lumbosacral region without mention of lumbar back pain or lower extremity pain: Principal | ICD-10-CM | POA: Diagnosis present

## 2012-06-27 DIAGNOSIS — M129 Arthropathy, unspecified: Secondary | ICD-10-CM | POA: Diagnosis present

## 2012-06-27 DIAGNOSIS — I10 Essential (primary) hypertension: Secondary | ICD-10-CM | POA: Diagnosis present

## 2012-06-27 DIAGNOSIS — M5137 Other intervertebral disc degeneration, lumbosacral region: Principal | ICD-10-CM | POA: Diagnosis present

## 2012-06-27 DIAGNOSIS — K219 Gastro-esophageal reflux disease without esophagitis: Secondary | ICD-10-CM | POA: Diagnosis present

## 2012-06-27 DIAGNOSIS — M4316 Spondylolisthesis, lumbar region: Secondary | ICD-10-CM

## 2012-06-27 DIAGNOSIS — Z853 Personal history of malignant neoplasm of breast: Secondary | ICD-10-CM

## 2012-06-27 DIAGNOSIS — Z6832 Body mass index (BMI) 32.0-32.9, adult: Secondary | ICD-10-CM

## 2012-06-27 LAB — TYPE AND SCREEN
ABO/RH(D): O NEG
Antibody Screen: NEGATIVE

## 2012-06-27 LAB — BASIC METABOLIC PANEL WITH GFR
BUN: 19 mg/dL (ref 6–23)
CO2: 24 meq/L (ref 19–32)
Calcium: 9.5 mg/dL (ref 8.4–10.5)
Chloride: 103 meq/L (ref 96–112)
Creatinine, Ser: 0.71 mg/dL (ref 0.50–1.10)
GFR calc Af Amer: >90 mL/min
GFR calc non Af Amer: 82 mL/min — ABNORMAL LOW
Glucose, Bld: 90 mg/dL (ref 70–99)
Potassium: 3.6 meq/L (ref 3.5–5.1)
Sodium: 140 meq/L (ref 135–145)

## 2012-06-27 LAB — CBC
HCT: 35.8 % — ABNORMAL LOW (ref 36.0–46.0)
Hemoglobin: 11.5 g/dL — ABNORMAL LOW (ref 12.0–15.0)
MCH: 27.6 pg (ref 26.0–34.0)
MCHC: 32.1 g/dL (ref 30.0–36.0)
MCV: 86.1 fL (ref 78.0–100.0)
Platelets: 199 K/uL (ref 150–400)
RBC: 4.16 MIL/uL (ref 3.87–5.11)
RDW: 14.5 % (ref 11.5–15.5)
WBC: 8.1 K/uL (ref 4.0–10.5)

## 2012-06-27 LAB — GLUCOSE, CAPILLARY
Glucose-Capillary: 110 mg/dL — ABNORMAL HIGH (ref 70–99)
Glucose-Capillary: 125 mg/dL — ABNORMAL HIGH (ref 70–99)

## 2012-06-27 LAB — POCT I-STAT GLUCOSE
Glucose, Bld: 78 mg/dL (ref 70–99)
Operator id: 333011

## 2012-06-27 SURGERY — POSTERIOR LUMBAR FUSION 1 LEVEL
Anesthesia: General | Site: Spine Lumbar | Wound class: Clean

## 2012-06-27 MED ORDER — THROMBIN 20000 UNITS EX SOLR
CUTANEOUS | Status: DC | PRN
  Administered 2012-06-27: 09:00:00 via TOPICAL

## 2012-06-27 MED ORDER — GLYCOPYRROLATE 0.2 MG/ML IJ SOLN
INTRAMUSCULAR | Status: DC | PRN
  Administered 2012-06-27: 0.4 mg via INTRAVENOUS

## 2012-06-27 MED ORDER — METOPROLOL SUCCINATE ER 100 MG PO TB24: 100.0000 mg | ORAL_TABLET | Freq: Two times a day (BID) | ORAL | Status: DC

## 2012-06-27 MED ORDER — LIDOCAINE-EPINEPHRINE 1 %-1:100000 IJ SOLN
INTRAMUSCULAR | Status: DC | PRN
  Administered 2012-06-27: 5 mL

## 2012-06-27 MED ORDER — INSULIN GLARGINE 100 UNIT/ML ~~LOC~~ SOLN
50.0000 [IU] | Freq: Every morning | SUBCUTANEOUS | Status: DC
  Administered 2012-06-28 – 2012-07-01 (×4): 50 [IU] via SUBCUTANEOUS
  Filled 2012-06-27 (×4): qty 0.5

## 2012-06-27 MED ORDER — PANTOPRAZOLE SODIUM 40 MG PO TBEC
40.0000 mg | DELAYED_RELEASE_TABLET | Freq: Every day | ORAL | Status: DC
  Administered 2012-06-28 – 2012-07-01 (×4): 40 mg via ORAL
  Filled 2012-06-27 (×4): qty 1

## 2012-06-27 MED ORDER — DOCUSATE SODIUM 100 MG PO CAPS
100.0000 mg | ORAL_CAPSULE | Freq: Two times a day (BID) | ORAL | Status: DC | PRN
  Filled 2012-06-27 (×3): qty 1

## 2012-06-27 MED ORDER — ANASTROZOLE 1 MG PO TABS
1.0000 mg | ORAL_TABLET | Freq: Every day | ORAL | Status: DC
Start: 2012-06-28 — End: 2012-07-01
  Administered 2012-06-28 – 2012-07-01 (×4): 1 mg via ORAL
  Filled 2012-06-27 (×5): qty 1

## 2012-06-27 MED ORDER — PHENYLEPHRINE HCL 10 MG/ML IJ SOLN
10.0000 mg | INTRAMUSCULAR | Status: DC | PRN
  Administered 2012-06-27: 25 ug/min via INTRAVENOUS

## 2012-06-27 MED ORDER — SODIUM CHLORIDE 0.9 % IV SOLN: 250.0000 mL | INTRAVENOUS | Status: DC

## 2012-06-27 MED ORDER — ROCURONIUM BROMIDE 100 MG/10ML IV SOLN
INTRAVENOUS | Status: DC | PRN
  Administered 2012-06-27: 10 mg via INTRAVENOUS
  Administered 2012-06-27: 40 mg via INTRAVENOUS

## 2012-06-27 MED ORDER — MORPHINE SULFATE (PF) 1 MG/ML IV SOLN
INTRAVENOUS | Status: AC
  Administered 2012-06-27: 13:00:00
  Filled 2012-06-27: qty 25

## 2012-06-27 MED ORDER — BACITRACIN 50000 UNITS IM SOLR
INTRAMUSCULAR | Status: AC
  Filled 2012-06-27: qty 1

## 2012-06-27 MED ORDER — HYDROCODONE-ACETAMINOPHEN 5-325 MG PO TABS: 1.0000 | ORAL_TABLET | ORAL | Status: DC | PRN

## 2012-06-27 MED ORDER — MENTHOL 3 MG MT LOZG: 1.0000 | LOZENGE | OROMUCOSAL | Status: DC | PRN

## 2012-06-27 MED ORDER — EPINEPHRINE 0.3 MG/0.3ML IJ DEVI: 0.3000 mg | Freq: Once | INTRAMUSCULAR | Status: AC | PRN

## 2012-06-27 MED ORDER — MORPHINE SULFATE (PF) 1 MG/ML IV SOLN
INTRAVENOUS | Status: DC
  Administered 2012-06-27: 1 mg via INTRAVENOUS
  Administered 2012-06-27: 3 mg via INTRAVENOUS
  Administered 2012-06-28: 4 mg via INTRAVENOUS
  Administered 2012-06-28 (×3): 1 mg via INTRAVENOUS
  Administered 2012-06-28: 15:00:00 via INTRAVENOUS
  Administered 2012-06-28: 7 mg via INTRAVENOUS
  Filled 2012-06-27: qty 25

## 2012-06-27 MED ORDER — SENNA 8.6 MG PO TABS
1.0000 | ORAL_TABLET | Freq: Two times a day (BID) | ORAL | Status: DC
  Administered 2012-06-27 – 2012-07-01 (×8): 8.6 mg via ORAL
  Filled 2012-06-27 (×9): qty 1

## 2012-06-27 MED ORDER — ZOLPIDEM TARTRATE 5 MG PO TABS: 5.0000 mg | ORAL_TABLET | Freq: Every evening | ORAL | Status: DC | PRN

## 2012-06-27 MED ORDER — KCL IN DEXTROSE-NACL 20-5-0.45 MEQ/L-%-% IV SOLN
INTRAVENOUS | Status: AC
  Filled 2012-06-27: qty 1000

## 2012-06-27 MED ORDER — ONDANSETRON HCL 4 MG/2ML IJ SOLN: 4.0000 mg | Freq: Four times a day (QID) | INTRAMUSCULAR | Status: DC | PRN

## 2012-06-27 MED ORDER — POLYETHYLENE GLYCOL 3350 17 G PO PACK
17.0000 g | PACK | Freq: Every day | ORAL | Status: DC | PRN
  Administered 2012-06-29: 17 g via ORAL
  Filled 2012-06-27: qty 1

## 2012-06-27 MED ORDER — OXYCODONE HCL 5 MG PO TABS
ORAL_TABLET | ORAL | Status: AC
  Administered 2012-06-27: 5 mg
  Filled 2012-06-27: qty 1

## 2012-06-27 MED ORDER — SODIUM CHLORIDE 0.9 % IV SOLN
INTRAVENOUS | Status: AC
  Filled 2012-06-27: qty 500

## 2012-06-27 MED ORDER — AMLODIPINE BESYLATE 10 MG PO TABS
10.0000 mg | ORAL_TABLET | Freq: Every morning | ORAL | Status: DC
  Administered 2012-06-28 – 2012-07-01 (×4): 10 mg via ORAL
  Filled 2012-06-27 (×4): qty 1

## 2012-06-27 MED ORDER — DULOXETINE HCL 60 MG PO CPEP
60.0000 mg | ORAL_CAPSULE | Freq: Every day | ORAL | Status: DC
  Administered 2012-06-28 – 2012-06-30 (×3): 60 mg via ORAL
  Filled 2012-06-27 (×4): qty 1

## 2012-06-27 MED ORDER — PHENYLEPHRINE HCL 10 MG/ML IJ SOLN
INTRAMUSCULAR | Status: DC | PRN
  Administered 2012-06-27 (×4): 80 ug via INTRAVENOUS

## 2012-06-27 MED ORDER — BISACODYL 10 MG RE SUPP: 10.0000 mg | Freq: Every day | RECTAL | Status: DC | PRN

## 2012-06-27 MED ORDER — PHENOL 1.4 % MT LIQD: 1.0000 | OROMUCOSAL | Status: DC | PRN

## 2012-06-27 MED ORDER — ARTIFICIAL TEARS OP OINT
TOPICAL_OINTMENT | OPHTHALMIC | Status: DC | PRN
  Administered 2012-06-27: 1 via OPHTHALMIC

## 2012-06-27 MED ORDER — HYDRALAZINE HCL 25 MG PO TABS
25.0000 mg | ORAL_TABLET | Freq: Three times a day (TID) | ORAL | Status: DC
  Administered 2012-06-27 – 2012-07-01 (×11): 25 mg via ORAL
  Filled 2012-06-27 (×15): qty 1

## 2012-06-27 MED ORDER — SODIUM CHLORIDE 0.9 % IJ SOLN
3.0000 mL | INTRAMUSCULAR | Status: DC | PRN
  Administered 2012-06-29: 3 mL via INTRAVENOUS

## 2012-06-27 MED ORDER — MEPERIDINE HCL 25 MG/ML IJ SOLN: 6.2500 mg | INTRAMUSCULAR | Status: DC | PRN

## 2012-06-27 MED ORDER — ONDANSETRON HCL 4 MG/2ML IJ SOLN: 4.0000 mg | INTRAMUSCULAR | Status: DC | PRN

## 2012-06-27 MED ORDER — LINAGLIPTIN 5 MG PO TABS
5.0000 mg | ORAL_TABLET | Freq: Every day | ORAL | Status: DC
  Administered 2012-06-27 – 2012-07-01 (×5): 5 mg via ORAL
  Filled 2012-06-27 (×5): qty 1

## 2012-06-27 MED ORDER — PROMETHAZINE HCL 25 MG/ML IJ SOLN: 6.2500 mg | INTRAMUSCULAR | Status: DC | PRN

## 2012-06-27 MED ORDER — PANTOPRAZOLE SODIUM 40 MG IV SOLR
40.0000 mg | Freq: Every day | INTRAVENOUS | Status: DC
  Administered 2012-06-27: 40 mg via INTRAVENOUS
  Filled 2012-06-27 (×2): qty 40

## 2012-06-27 MED ORDER — PROPOFOL 10 MG/ML IV BOLUS
INTRAVENOUS | Status: DC | PRN
  Administered 2012-06-27: 120 mg via INTRAVENOUS

## 2012-06-27 MED ORDER — GLYBURIDE-METFORMIN 2.5-500 MG PO TABS: 2.0000 | ORAL_TABLET | Freq: Two times a day (BID) | ORAL | Status: DC

## 2012-06-27 MED ORDER — DIPHENHYDRAMINE HCL 50 MG/ML IJ SOLN
12.5000 mg | Freq: Four times a day (QID) | INTRAMUSCULAR | Status: DC | PRN
  Administered 2012-06-28: 12.5 mg via INTRAVENOUS
  Filled 2012-06-27: qty 1

## 2012-06-27 MED ORDER — DIPHENHYDRAMINE HCL 12.5 MG/5ML PO ELIX
12.5000 mg | ORAL_SOLUTION | Freq: Four times a day (QID) | ORAL | Status: DC | PRN
  Administered 2012-06-28: 12.5 mg via ORAL
  Filled 2012-06-27: qty 10

## 2012-06-27 MED ORDER — HYDROCODONE-ACETAMINOPHEN 10-325 MG PO TABS: 1.0000 | ORAL_TABLET | Freq: Three times a day (TID) | ORAL | Status: DC | PRN

## 2012-06-27 MED ORDER — 0.9 % SODIUM CHLORIDE (POUR BTL) OPTIME
TOPICAL | Status: DC | PRN
  Administered 2012-06-27: 1000 mL

## 2012-06-27 MED ORDER — ACETAMINOPHEN 10 MG/ML IV SOLN
1000.0000 mg | Freq: Once | INTRAVENOUS | Status: AC
  Filled 2012-06-27: qty 100

## 2012-06-27 MED ORDER — OXYCODONE HCL 5 MG PO TABS: 5.0000 mg | ORAL_TABLET | Freq: Once | ORAL | Status: DC | PRN

## 2012-06-27 MED ORDER — GABAPENTIN 300 MG PO CAPS
300.0000 mg | ORAL_CAPSULE | Freq: Three times a day (TID) | ORAL | Status: DC
  Administered 2012-06-27 – 2012-07-01 (×12): 300 mg via ORAL
  Filled 2012-06-27 (×14): qty 1

## 2012-06-27 MED ORDER — SODIUM CHLORIDE 0.9 % IJ SOLN: 9.0000 mL | INTRAMUSCULAR | Status: DC | PRN

## 2012-06-27 MED ORDER — DOCUSATE SODIUM 100 MG PO CAPS
100.0000 mg | ORAL_CAPSULE | Freq: Two times a day (BID) | ORAL | Status: DC
  Administered 2012-06-27 – 2012-06-30 (×7): 100 mg via ORAL
  Filled 2012-06-27 (×4): qty 1

## 2012-06-27 MED ORDER — ACETAMINOPHEN 650 MG RE SUPP: 650.0000 mg | RECTAL | Status: DC | PRN

## 2012-06-27 MED ORDER — ATORVASTATIN CALCIUM 10 MG PO TABS
10.0000 mg | ORAL_TABLET | Freq: Every day | ORAL | Status: DC
  Administered 2012-06-28 – 2012-06-30 (×3): 10 mg via ORAL
  Filled 2012-06-27 (×4): qty 1

## 2012-06-27 MED ORDER — NEOSTIGMINE METHYLSULFATE 1 MG/ML IJ SOLN
INTRAMUSCULAR | Status: DC | PRN
  Administered 2012-06-27: 3 mg via INTRAVENOUS

## 2012-06-27 MED ORDER — ACETAMINOPHEN 325 MG PO TABS
650.0000 mg | ORAL_TABLET | ORAL | Status: DC | PRN
  Administered 2012-06-28 – 2012-06-29 (×3): 650 mg via ORAL
  Filled 2012-06-27 (×3): qty 2

## 2012-06-27 MED ORDER — BENAZEPRIL HCL 20 MG PO TABS
20.0000 mg | ORAL_TABLET | Freq: Every morning | ORAL | Status: DC
  Administered 2012-06-28 – 2012-07-01 (×4): 20 mg via ORAL
  Filled 2012-06-27 (×4): qty 1

## 2012-06-27 MED ORDER — ASPIRIN EC 81 MG PO TBEC
81.0000 mg | DELAYED_RELEASE_TABLET | Freq: Every day | ORAL | Status: DC
  Administered 2012-06-29 – 2012-07-01 (×3): 81 mg via ORAL
  Filled 2012-06-27 (×5): qty 1

## 2012-06-27 MED ORDER — CALCIUM CARBONATE-VITAMIN D 500-200 MG-UNIT PO TABS
1.0000 | ORAL_TABLET | Freq: Every day | ORAL | Status: DC
  Administered 2012-06-28 – 2012-07-01 (×4): 1 via ORAL
  Filled 2012-06-27 (×5): qty 1

## 2012-06-27 MED ORDER — FLEET ENEMA 7-19 GM/118ML RE ENEM: 1.0000 | ENEMA | Freq: Once | RECTAL | Status: AC | PRN

## 2012-06-27 MED ORDER — FENTANYL CITRATE 0.05 MG/ML IJ SOLN
INTRAMUSCULAR | Status: DC | PRN
  Administered 2012-06-27 (×2): 25 ug via INTRAVENOUS
  Administered 2012-06-27: 50 ug via INTRAVENOUS
  Administered 2012-06-27: 200 ug via INTRAVENOUS

## 2012-06-27 MED ORDER — KCL IN DEXTROSE-NACL 20-5-0.45 MEQ/L-%-% IV SOLN
INTRAVENOUS | Status: DC
  Administered 2012-06-27 – 2012-06-28 (×2): via INTRAVENOUS
  Filled 2012-06-27 (×9): qty 1000

## 2012-06-27 MED ORDER — HYDROMORPHONE HCL PF 1 MG/ML IJ SOLN
0.2500 mg | INTRAMUSCULAR | Status: DC | PRN
  Administered 2012-06-27 (×2): 0.5 mg via INTRAVENOUS

## 2012-06-27 MED ORDER — METFORMIN HCL 500 MG PO TABS
1000.0000 mg | ORAL_TABLET | Freq: Two times a day (BID) | ORAL | Status: DC
  Administered 2012-06-27 – 2012-07-01 (×8): 1000 mg via ORAL
  Filled 2012-06-27 (×11): qty 2

## 2012-06-27 MED ORDER — OXYCODONE HCL 5 MG/5ML PO SOLN: 5.0000 mg | Freq: Once | ORAL | Status: DC | PRN

## 2012-06-27 MED ORDER — CEFAZOLIN SODIUM 1-5 GM-% IV SOLN
1.0000 g | Freq: Three times a day (TID) | INTRAVENOUS | Status: AC
  Administered 2012-06-27 – 2012-06-28 (×2): 1 g via INTRAVENOUS
  Filled 2012-06-27 (×2): qty 50

## 2012-06-27 MED ORDER — ALBUMIN HUMAN 5 % IV SOLN
INTRAVENOUS | Status: DC | PRN
  Administered 2012-06-27: 10:00:00 via INTRAVENOUS

## 2012-06-27 MED ORDER — DEXTROSE 50 % IV SOLN
INTRAVENOUS | Status: AC
  Administered 2012-06-27 (×2): 12.5 g via INTRAVENOUS
  Filled 2012-06-27: qty 50

## 2012-06-27 MED ORDER — NAPHAZOLINE-PHENIRAMINE 0.027-0.315 % OP SOLN: 1.0000 [drp] | Freq: Every morning | OPHTHALMIC | Status: DC

## 2012-06-27 MED ORDER — EPHEDRINE SULFATE 50 MG/ML IJ SOLN
INTRAMUSCULAR | Status: DC | PRN
  Administered 2012-06-27: 10 mg via INTRAVENOUS

## 2012-06-27 MED ORDER — CALCIUM-VITAMIN D 600-400 MG-UNIT PO TABS: 1.0000 | ORAL_TABLET | Freq: Every morning | ORAL | Status: DC

## 2012-06-27 MED ORDER — LACTATED RINGERS IV SOLN
INTRAVENOUS | Status: DC | PRN
  Administered 2012-06-27 (×3): via INTRAVENOUS

## 2012-06-27 MED ORDER — NALOXONE HCL 0.4 MG/ML IJ SOLN: 0.4000 mg | INTRAMUSCULAR | Status: DC | PRN

## 2012-06-27 MED ORDER — DIAZEPAM 5 MG PO TABS: 5.0000 mg | ORAL_TABLET | Freq: Four times a day (QID) | ORAL | Status: DC | PRN

## 2012-06-27 MED ORDER — ONDANSETRON HCL 4 MG/2ML IJ SOLN
INTRAMUSCULAR | Status: DC | PRN
  Administered 2012-06-27: 4 mg via INTRAVENOUS

## 2012-06-27 MED ORDER — SODIUM CHLORIDE 0.9 % IJ SOLN
3.0000 mL | Freq: Two times a day (BID) | INTRAMUSCULAR | Status: DC
  Administered 2012-06-27 – 2012-06-30 (×5): 3 mL via INTRAVENOUS

## 2012-06-27 MED ORDER — METOPROLOL SUCCINATE ER 100 MG PO TB24
100.0000 mg | ORAL_TABLET | Freq: Every day | ORAL | Status: DC
  Administered 2012-06-28 – 2012-07-01 (×4): 100 mg via ORAL
  Filled 2012-06-27 (×4): qty 1

## 2012-06-27 MED ORDER — MIDAZOLAM HCL 2 MG/2ML IJ SOLN: 0.5000 mg | Freq: Once | INTRAMUSCULAR | Status: DC | PRN

## 2012-06-27 MED ORDER — MIDAZOLAM HCL 5 MG/5ML IJ SOLN
INTRAMUSCULAR | Status: DC | PRN
  Administered 2012-06-27: 2 mg via INTRAVENOUS

## 2012-06-27 MED ORDER — GLYBURIDE 5 MG PO TABS
5.0000 mg | ORAL_TABLET | Freq: Two times a day (BID) | ORAL | Status: DC
  Administered 2012-06-27 – 2012-07-01 (×8): 5 mg via ORAL
  Filled 2012-06-27 (×13): qty 1

## 2012-06-27 MED ORDER — LIDOCAINE HCL (CARDIAC) 20 MG/ML IV SOLN
INTRAVENOUS | Status: DC | PRN
  Administered 2012-06-27: 20 mg via INTRAVENOUS

## 2012-06-27 MED ORDER — BUPIVACAINE HCL (PF) 0.5 % IJ SOLN
INTRAMUSCULAR | Status: DC | PRN
  Administered 2012-06-27: 5 mL

## 2012-06-27 MED ORDER — HYDROMORPHONE HCL PF 1 MG/ML IJ SOLN
INTRAMUSCULAR | Status: AC
  Filled 2012-06-27: qty 1

## 2012-06-27 MED ORDER — NAPHAZOLINE-PHENIRAMINE 0.025-0.3 % OP SOLN
1.0000 [drp] | Freq: Every day | OPHTHALMIC | Status: DC
  Administered 2012-07-01: 1 [drp] via OPHTHALMIC
  Filled 2012-06-27: qty 15

## 2012-06-27 MED ORDER — OXYCODONE-ACETAMINOPHEN 5-325 MG PO TABS: 1.0000 | ORAL_TABLET | ORAL | Status: DC | PRN

## 2012-06-27 SURGICAL SUPPLY — 83 items
ADH SKN CLS APL DERMABOND .7 (GAUZE/BANDAGES/DRESSINGS)
APL SKNCLS STERI-STRIP NONHPOA (GAUZE/BANDAGES/DRESSINGS) ×1
BAG DECANTER FOR FLEXI CONT (MISCELLANEOUS) ×1 IMPLANT
BENZOIN TINCTURE PRP APPL 2/3 (GAUZE/BANDAGES/DRESSINGS) ×2 IMPLANT
BLADE SURG ROTATE 9660 (MISCELLANEOUS) IMPLANT
BONE VOID FILLER STRIP 10CC (Bone Implant) ×2 IMPLANT
BUR MATCHSTICK NEURO 3.0 LAGG (BURR) ×2 IMPLANT
BUR PRECISION FLUTE 5.0 (BURR) ×2 IMPLANT
CANISTER SUCTION 2500CC (MISCELLANEOUS) ×2 IMPLANT
CLOTH BEACON ORANGE TIMEOUT ST (SAFETY) ×2 IMPLANT
CONT SPEC 4OZ CLIKSEAL STRL BL (MISCELLANEOUS) ×4 IMPLANT
COVER BACK TABLE 24X17X13 BIG (DRAPES) IMPLANT
COVER TABLE BACK 60X90 (DRAPES) ×2 IMPLANT
DERMABOND ADVANCED (GAUZE/BANDAGES/DRESSINGS)
DERMABOND ADVANCED .7 DNX12 (GAUZE/BANDAGES/DRESSINGS) ×1 IMPLANT
DRAPE C-ARM 42X72 X-RAY (DRAPES) ×4 IMPLANT
DRAPE LAPAROTOMY 100X72X124 (DRAPES) ×2 IMPLANT
DRAPE MICROSCOPE LEICA (MISCELLANEOUS) ×1 IMPLANT
DRAPE POUCH INSTRU U-SHP 10X18 (DRAPES) ×2 IMPLANT
DRAPE SURG 17X23 STRL (DRAPES) ×2 IMPLANT
DRESSING TELFA 8X3 (GAUZE/BANDAGES/DRESSINGS) ×2 IMPLANT
DURAPREP 26ML APPLICATOR (WOUND CARE) ×2 IMPLANT
ELECT REM PT RETURN 9FT ADLT (ELECTROSURGICAL) ×2
ELECTRODE REM PT RTRN 9FT ADLT (ELECTROSURGICAL) ×1 IMPLANT
GAUZE SPONGE 4X4 16PLY XRAY LF (GAUZE/BANDAGES/DRESSINGS) IMPLANT
GLOVE BIO SURGEON STRL SZ8 (GLOVE) ×4 IMPLANT
GLOVE BIOGEL PI IND STRL 7.0 (GLOVE) IMPLANT
GLOVE BIOGEL PI IND STRL 7.5 (GLOVE) IMPLANT
GLOVE BIOGEL PI IND STRL 8 (GLOVE) ×2 IMPLANT
GLOVE BIOGEL PI IND STRL 8.5 (GLOVE) ×2 IMPLANT
GLOVE BIOGEL PI INDICATOR 7.0 (GLOVE) ×2
GLOVE BIOGEL PI INDICATOR 7.5 (GLOVE) ×1
GLOVE BIOGEL PI INDICATOR 8 (GLOVE) ×2
GLOVE BIOGEL PI INDICATOR 8.5 (GLOVE) ×4
GLOVE ECLIPSE 7.5 STRL STRAW (GLOVE) ×9 IMPLANT
GLOVE ECLIPSE 8.0 STRL XLNG CF (GLOVE) ×4 IMPLANT
GLOVE EXAM NITRILE LRG STRL (GLOVE) ×6 IMPLANT
GLOVE EXAM NITRILE MD LF STRL (GLOVE) IMPLANT
GLOVE EXAM NITRILE XL STR (GLOVE) IMPLANT
GLOVE EXAM NITRILE XS STR PU (GLOVE) IMPLANT
GOWN BRE IMP SLV AUR LG STRL (GOWN DISPOSABLE) ×8 IMPLANT
GOWN BRE IMP SLV AUR XL STRL (GOWN DISPOSABLE) ×5 IMPLANT
GOWN STRL REIN 2XL LVL4 (GOWN DISPOSABLE) ×5 IMPLANT
KIT BASIN OR (CUSTOM PROCEDURE TRAY) ×2 IMPLANT
KIT INFUSE SMALL (Orthopedic Implant) ×1 IMPLANT
KIT POSITION SURG JACKSON T1 (MISCELLANEOUS) ×2 IMPLANT
KIT ROOM TURNOVER OR (KITS) ×2 IMPLANT
MILL MEDIUM DISP (BLADE) ×2 IMPLANT
NDL HYPO 25X1 1.5 SAFETY (NEEDLE) ×1 IMPLANT
NDL SPNL 18GX3.5 QUINCKE PK (NEEDLE) IMPLANT
NEEDLE HYPO 25X1 1.5 SAFETY (NEEDLE) ×2 IMPLANT
NEEDLE SPNL 18GX3.5 QUINCKE PK (NEEDLE) ×2 IMPLANT
NS IRRIG 1000ML POUR BTL (IV SOLUTION) ×2 IMPLANT
PACK LAMINECTOMY NEURO (CUSTOM PROCEDURE TRAY) ×2 IMPLANT
PAD ARMBOARD 7.5X6 YLW CONV (MISCELLANEOUS) ×6 IMPLANT
PATTIES SURGICAL .5 X.5 (GAUZE/BANDAGES/DRESSINGS) IMPLANT
PATTIES SURGICAL .5 X1 (DISPOSABLE) IMPLANT
PATTIES SURGICAL 1X1 (DISPOSABLE) IMPLANT
RASP 3.0MM (RASP) ×1 IMPLANT
ROD 35MM (Rod) ×1 IMPLANT
ROD TI 5.5MM 6CM (Rod) ×1 IMPLANT
RUBBERBAND STERILE (MISCELLANEOUS) ×2 IMPLANT
SCREW 40MM (Screw) ×3 IMPLANT
SCREW POLYAX 6.5X45MM (Screw) ×3 IMPLANT
SCREW SET SPINAL STD HEXALOBE (Screw) ×4 IMPLANT
SPONGE GAUZE 4X4 12PLY (GAUZE/BANDAGES/DRESSINGS) ×2 IMPLANT
SPONGE LAP 4X18 X RAY DECT (DISPOSABLE) IMPLANT
SPONGE SURGIFOAM ABS GEL 100 (HEMOSTASIS) ×2 IMPLANT
STAPLER SKIN PROX WIDE 3.9 (STAPLE) IMPLANT
STRIP CLOSURE SKIN 1/2X4 (GAUZE/BANDAGES/DRESSINGS) ×3 IMPLANT
SUT VIC AB 1 CT1 18XBRD ANBCTR (SUTURE) ×2 IMPLANT
SUT VIC AB 1 CT1 8-18 (SUTURE) ×2
SUT VIC AB 2-0 CT1 18 (SUTURE) ×4 IMPLANT
SUT VIC AB 3-0 SH 8-18 (SUTURE) ×4 IMPLANT
SYR 20ML ECCENTRIC (SYRINGE) ×2 IMPLANT
SYR 3ML LL SCALE MARK (SYRINGE) ×6 IMPLANT
SYR 5ML LL (SYRINGE) ×1 IMPLANT
TLIF 7MM (Spine Construct) ×1 IMPLANT
TOWEL OR 17X24 6PK STRL BLUE (TOWEL DISPOSABLE) ×2 IMPLANT
TOWEL OR 17X26 10 PK STRL BLUE (TOWEL DISPOSABLE) ×2 IMPLANT
TRAP SPECIMEN MUCOUS 40CC (MISCELLANEOUS) ×2 IMPLANT
TRAY FOLEY CATH 14FRSI W/METER (CATHETERS) ×2 IMPLANT
WATER STERILE IRR 1000ML POUR (IV SOLUTION) ×2 IMPLANT

## 2012-06-27 NOTE — Progress Notes (Signed)
Lunch relief by Mark Brande RN 

## 2012-06-27 NOTE — Progress Notes (Signed)
Utilization review completed.  

## 2012-06-27 NOTE — Transfer of Care (Signed)
Immediate Anesthesia Transfer of Care Note  Patient: [name]  Procedure(s) Performed: Procedure(s) with comments: Transforaminal lumbar interbody fusion Lumbar five-sacral one (N/A) - Redo Lumbar five-sacral one Laminectomy/Fusion  Patient Location: PACU  Anesthesia Type:General  Level of Consciousness: awake, alert , oriented and patient cooperative  Airway & Oxygen Therapy: Patient Spontanous Breathing and Patient connected to nasal cannula oxygen  Post-op Assessment: Report given to PACU RN, Post -op Vital signs reviewed and stable and Patient moving all extremities  Post vital signs: Reviewed and stable  Complications: No apparent anesthesia complications

## 2012-06-27 NOTE — Brief Op Note (Signed)
06/27/2012  12:14 PM  PATIENT:  [name]  79 y.o. female  PRE-OPERATIVE DIAGNOSIS:  Lumbar stenosis, Spondylolisthesis, Recurrent lumbar disc herniation, Lumbar radiculopathy  POST-OPERATIVE DIAGNOSIS: Lumbar stenosis, Spondylolisthesis, Recurrent lumbar disc herniation, Lumbar radiculopathy   PROCEDURE:  Procedure(s) with comments: Transforaminal lumbar interbody fusion Lumbar five-sacral one (N/A) - Redo Lumbar five-sacral one Laminectomy/Fusion with PEEK interbody cage, redo laminectomy and discectomy with pedicle screw fixation with posterolateral arthrodesis  SURGEON:  Surgeon(s) and Role:    * Erline Levine, MD - Primary    * Otilio Connors, MD - Assisting  PHYSICIAN ASSISTANT:   ASSISTANTS: Poteat, RN   ANESTHESIA:   general  EBL:  Total I/O In: 2750 [I.V.:2500; IV Piggyback:250] Out: 365 [Urine:65; Blood:300]  BLOOD ADMINISTERED:none  DRAINS: (Medium) Hemovact drain(s) in the epidural space with  Suction Open   LOCAL MEDICATIONS USED:  MARCAINE     SPECIMEN:  No Specimen  DISPOSITION OF SPECIMEN:  N/A  COUNTS:  YES  TOURNIQUET:  * No tourniquets in log *  DICTATION: Patient is 79 year old woman with mobile spondylolisthesis of L5 on S1 with lumbar stenosis. She has had multiple prior lumbar surgeries including fusion and laminectomy at L 5 S 1 on the right. She has a severe right L5 radiculopathy. It was elected to take her to surgery for decompression and fusion at this level.   Procedure: Patient was placed in a prone position on the [place] table after smooth and uncomplicated induction of general endotracheal anesthesia. Her low back was prepped and draped in usual sterile fashion with DuraPrep. Area of incision was infiltrated with local lidocaine. Incision was made to the lumbodorsal fascia was incised and exposure was performed through dense scar tissue of the L4 through S1 spinous processes laminae facet joint and transverse processes. Intraoperative  x-ray was obtained which confirmed correct orientation. A total hemi- laminectomy of L5 on the right was performed with disarticulation of the facet joints at this level and thorough decompression was performed of both L5 and S1 nerve roots along with the common dural tube. This decompression was more involved than would be typical of that performed for PLIF alone and included painstaking dissection of adherent ligament compressing the thecal sac and wide decompression of all neural elements. This was done using the operating microscope to remove recurrent disc herniation and dense scar tissue along nerve roots. A thorough discectomy was initially performed on the right with preparation of the endplates for grafting a trial spacer was placed this level and a thorough discectomy. Bone autograft was packed within the interspace bilaterally along with small BMP kit and NexOss bone graft extender. Medium 7 mm peek cage was packed with BMP and extender and was inserted the interspace and countersunk appropriately along with 4 cc of morselized bone autograft. The previously placed Monarch screws were removed at L 5 on the right and L 4 and L 5 on the right after cutting the rods with a metal cutting bur. These screws were replaced with 6.5 x 45 mm Alphatec screws and new sacral screws were placed  Bilaterally using 6.5 x 40 mm bicortical screw on the right and 7.5 x 40 mm bicortical screw on the left. Final x-rays demonstrated well-positioned interbody grafts and pedicle screw fixation. A 77 mm lordotic rod was placed on the left and a 60 mm rod was placed on the righ locked down in situ and the posterolateral region was packed with the remaining BMP and bone graft extender bilaterally, but  particularly over the decorticated left L5 S1 laminar/facet complex and sacral ala. The wound was irrigated and a medium Hemovac drain was placed in the epidural space. Fascia was closed with 1 Vicryl sutures skin edges were  reapproximated 2 and 3-0 Vicryl sutures. The wound is dressed with benzoin Steri-Strips Telfa gauze and tape the patient was extubated in the operating room and taken to recovery in stable satisfactory condition. She tolerated the operation well counts were correct at the end of the case.   PLAN OF CARE: Admit to inpatient   PATIENT DISPOSITION:  PACU - hemodynamically stable.   Delay start of Pharmacological VTE agent (>24hrs) due to surgical blood loss or risk of bleeding: yes

## 2012-06-27 NOTE — Progress Notes (Signed)
Reduced dose Morphine PCA (per age criteria) begun, and verified by Gennaro Africa RN

## 2012-06-27 NOTE — Interval H&P Note (Signed)
History and Physical Interval Note:  06/27/2012 6:38 AM  [name]  has presented today for surgery, with the diagnosis of Lumbar stenosis, Spondylolisthesis, Lumbar radiculopathy  The various methods of treatment have been discussed with the patient and family. After consideration of risks, benefits and other options for treatment, the patient has consented to  Procedure(s) with comments: POSTERIOR LUMBAR FUSION 1 LEVEL (N/A) - Redo L5-S1 Laminectomy/Fusion as a surgical intervention .  The patient's history has been reviewed, patient examined, no change in status, stable for surgery.  I have reviewed the patient's chart and labs.  Questions were answered to the patient's satisfaction.     Berthel Bagnall D

## 2012-06-27 NOTE — Anesthesia Preprocedure Evaluation (Signed)
Anesthesia Evaluation  Patient identified by MRN, date of birth, ID band Patient awake    Reviewed: Allergy & Precautions, H&P , NPO status , Patient's Chart, lab work & pertinent test results  History of Anesthesia Complications (+) PONV  Airway Mallampati: II TM Distance: >3 FB Neck ROM: Full    Dental  (+) Teeth Intact and Dental Advisory Given   Pulmonary neg pulmonary ROS,  breath sounds clear to auscultation  Pulmonary exam normal       Cardiovascular hypertension, Pt. on medications Rhythm:Regular Rate:Normal     Neuro/Psych Chronic back pain: narcotic dependent    GI/Hepatic Neg liver ROS, GERD-  Controlled and Medicated,  Endo/Other  diabetes, Well Controlled, Type 2, Insulin DependentMorbid obesity  Renal/GU Renal diseasenegative Renal ROS     Musculoskeletal   Abdominal (+) + obese,   Peds  Hematology   Anesthesia Other Findings   Reproductive/Obstetrics                           Anesthesia Physical Anesthesia Plan  ASA: III  Anesthesia Plan: General   Post-op Pain Management:    Induction: Intravenous  Airway Management Planned: Oral ETT  Additional Equipment:   Intra-op Plan:   Post-operative Plan: Extubation in OR  Informed Consent: I have reviewed the patients History and Physical, chart, labs and discussed the procedure including the risks, benefits and alternatives for the proposed anesthesia with the patient or authorized representative who has indicated his/her understanding and acceptance.   Dental advisory given  Plan Discussed with: Surgeon and CRNA  Anesthesia Plan Comments: (Plan routine monitors, GETA )        Anesthesia Quick Evaluation

## 2012-06-27 NOTE — Anesthesia Postprocedure Evaluation (Signed)
  Anesthesia Post-op Note  Patient: [name]  Procedure(s) Performed: Procedure(s) with comments: Transforaminal lumbar interbody fusion Lumbar five-sacral one (N/A) - Redo Lumbar five-sacral one Laminectomy/Fusion  Patient Location: PACU  Anesthesia Type:General  Level of Consciousness: awake, alert , oriented and patient cooperative  Airway and Oxygen Therapy: Patient Spontanous Breathing and Patient connected to nasal cannula oxygen  Post-op Pain: mild  Post-op Assessment: Post-op Vital signs reviewed, Patient's Cardiovascular Status Stable, Respiratory Function Stable, Patent Airway, No signs of Nausea or vomiting and Pain level controlled  Post-op Vital Signs: Reviewed and stable  Complications: No apparent anesthesia complications

## 2012-06-27 NOTE — Anesthesia Procedure Notes (Signed)
Procedure Name: Intubation Date/Time: 06/27/2012 7:59 AM Performed by: Julian Reil Pre-anesthesia Checklist: Patient identified, Emergency Drugs available, Suction available and Patient being monitored Patient Re-evaluated:Patient Re-evaluated prior to inductionOxygen Delivery Method: Circle system utilized Preoxygenation: Pre-oxygenation with 100% oxygen Intubation Type: IV induction Ventilation: Mask ventilation without difficulty Laryngoscope Size: Mac and 3 Grade View: Grade II Tube type: Oral Tube size: 7.5 mm Number of attempts: 1 Airway Equipment and Method: Stylet Placement Confirmation: ETT inserted through vocal cords under direct vision,  positive ETCO2 and breath sounds checked- equal and bilateral Secured at: 22 cm Tube secured with: Tape Dental Injury: Injury to lip

## 2012-06-27 NOTE — Progress Notes (Signed)
Late Entry Pt 79 years old female S/P T-LIF L5- S1 , dressing clean ,dry and intact with hemo vac in place  received from PACU to room 4 N 21. Pt made comfortable in bed . Neuro intact moves all extremities , denied any numbness and tingling. PCA morphine low dose set up from PACU ,verified  And educated pt on it use for pain as needed. No acute distress noted at this time. . RN wiill continue to   Monitor pt. Carin Hock RN

## 2012-06-27 NOTE — Op Note (Signed)
06/27/2012  12:14 PM  PATIENT:  [name]  79 y.o. female  PRE-OPERATIVE DIAGNOSIS:  Lumbar stenosis, Spondylolisthesis, Recurrent lumbar disc herniation, Lumbar radiculopathy  POST-OPERATIVE DIAGNOSIS: Lumbar stenosis, Spondylolisthesis, Recurrent lumbar disc herniation, Lumbar radiculopathy   PROCEDURE:  Procedure(s) with comments: Transforaminal lumbar interbody fusion Lumbar five-sacral one (N/A) - Redo Lumbar five-sacral one Laminectomy/Fusion with PEEK interbody cage, redo laminectomy and discectomy with pedicle screw fixation with posterolateral arthrodesis  SURGEON:  Surgeon(s) and Role:    * Erline Levine, MD - Primary    * Otilio Connors, MD - Assisting  PHYSICIAN ASSISTANT:   ASSISTANTS: Poteat, RN   ANESTHESIA:   general  EBL:  Total I/O In: 2750 [I.V.:2500; IV Piggyback:250] Out: 365 [Urine:65; Blood:300]  BLOOD ADMINISTERED:none  DRAINS: (Medium) Hemovact drain(s) in the epidural space with  Suction Open   LOCAL MEDICATIONS USED:  MARCAINE     SPECIMEN:  No Specimen  DISPOSITION OF SPECIMEN:  N/A  COUNTS:  YES  TOURNIQUET:  * No tourniquets in log *  DICTATION: Patient is 79 year old woman with mobile spondylolisthesis of L5 on S1 with lumbar stenosis. She has had multiple prior lumbar surgeries including fusion and laminectomy at L 5 S 1 on the right. She has a severe right L5 radiculopathy. It was elected to take her to surgery for decompression and fusion at this level.   Procedure: Patient was placed in a prone position on the [place] table after smooth and uncomplicated induction of general endotracheal anesthesia. Her low back was prepped and draped in usual sterile fashion with DuraPrep. Area of incision was infiltrated with local lidocaine. Incision was made to the lumbodorsal fascia was incised and exposure was performed through dense scar tissue of the L4 through S1 spinous processes laminae facet joint and transverse processes. Intraoperative  x-ray was obtained which confirmed correct orientation. A total hemi- laminectomy of L5 on the right was performed with disarticulation of the facet joints at this level and thorough decompression was performed of both L5 and S1 nerve roots along with the common dural tube. This decompression was more involved than would be typical of that performed for PLIF alone and included painstaking dissection of adherent ligament compressing the thecal sac and wide decompression of all neural elements. This was done using the operating microscope to remove recurrent disc herniation and dense scar tissue along nerve roots. A thorough discectomy was initially performed on the right with preparation of the endplates for grafting a trial spacer was placed this level and a thorough discectomy. Bone autograft was packed within the interspace bilaterally along with small BMP kit and NexOss bone graft extender. Medium 7 mm peek cage was packed with BMP and extender and was inserted the interspace and countersunk appropriately along with 4 cc of morselized bone autograft. The previously placed Monarch screws were removed at L 5 on the right and L 4 and L 5 on the right after cutting the rods with a metal cutting bur. These screws were replaced with 6.5 x 45 mm Alphatec screws and new sacral screws were placed  Bilaterally using 6.5 x 40 mm bicortical screw on the right and 7.5 x 40 mm bicortical screw on the left. Final x-rays demonstrated well-positioned interbody grafts and pedicle screw fixation. A 35 mm lordotic rod was placed on the left and a 60 mm rod was placed on the righ locked down in situ and the posterolateral region was packed with the remaining BMP and bone graft extender bilaterally, but  particularly over the decorticated left L5 S1 laminar/facet complex and sacral ala. The wound was irrigated and a medium Hemovac drain was placed in the epidural space. Fascia was closed with 1 Vicryl sutures skin edges were  reapproximated 2 and 3-0 Vicryl sutures. The wound is dressed with benzoin Steri-Strips Telfa gauze and tape the patient was extubated in the operating room and taken to recovery in stable satisfactory condition. She tolerated the operation well counts were correct at the end of the case.   PLAN OF CARE: Admit to inpatient   PATIENT DISPOSITION:  PACU - hemodynamically stable.   Delay start of Pharmacological VTE agent (>24hrs) due to surgical blood loss or risk of bleeding: yes

## 2012-06-27 NOTE — Preoperative (Signed)
Beta Blockers   Reason not to administer Beta Blockers:Not Applicable 

## 2012-06-27 NOTE — Progress Notes (Addendum)
Wrong charting note deleted

## 2012-06-27 NOTE — Progress Notes (Signed)
Awake, alert, conversant.  Patient says her right leg pain is improved.  Strength is full in both lower extremities, PF and DF.  Doing well.

## 2012-06-28 LAB — POCT I-STAT GLUCOSE: Operator id: 333011

## 2012-06-28 LAB — GLUCOSE, CAPILLARY
Glucose-Capillary: 86 mg/dL (ref 70–99)
Glucose-Capillary: 225 mg/dL — ABNORMAL HIGH (ref 70–99)
Glucose-Capillary: 142 mg/dL — ABNORMAL HIGH (ref 70–99)
Glucose-Capillary: 157 mg/dL — ABNORMAL HIGH (ref 70–99)

## 2012-06-28 LAB — POCT I-STAT 4, (NA,K, GLUC, HGB,HCT)
Glucose, Bld: 90 mg/dL (ref 70–99)
Potassium: 4.2 mEq/L (ref 3.5–5.1)
Sodium: 142 mEq/L (ref 135–145)

## 2012-06-28 MED ORDER — HYDROMORPHONE HCL 2 MG PO TABS
2.0000 mg | ORAL_TABLET | ORAL | Status: DC | PRN
  Administered 2012-06-29: 4 mg via ORAL
  Administered 2012-06-29 – 2012-06-30 (×4): 2 mg via ORAL
  Filled 2012-06-28 (×4): qty 1
  Filled 2012-06-28: qty 2
  Filled 2012-06-28: qty 1

## 2012-06-28 MED ORDER — DIPHENHYDRAMINE HCL 25 MG PO CAPS
25.0000 mg | ORAL_CAPSULE | Freq: Four times a day (QID) | ORAL | Status: DC | PRN
  Administered 2012-06-28 – 2012-07-01 (×8): 25 mg via ORAL
  Filled 2012-06-28 (×8): qty 1

## 2012-06-28 MED ORDER — OXYCODONE-ACETAMINOPHEN 5-325 MG PO TABS
1.0000 | ORAL_TABLET | ORAL | Status: DC | PRN
  Administered 2012-06-28 – 2012-07-01 (×5): 2 via ORAL
  Filled 2012-06-28 (×5): qty 2

## 2012-06-28 NOTE — Clinical Social Work Note (Signed)
Clinical Social Work   CSW received consult for SNF. CSW reviewed chart. PT/OT are recommending HHPT/OT at discharge. CSW will updated RNCM. CSW is signing off as no further needs identified. Please reconsult if a need arises prior to discharge.   Darden Dates, MSW, LCSW [phone]

## 2012-06-28 NOTE — Progress Notes (Signed)
Mobilize in brace

## 2012-06-28 NOTE — Evaluation (Signed)
Occupational Therapy Evaluation Patient Details Name: [name] MRN: [id number] DOB: 12/15/1933 Today's Date: 06/28/2012 Time: [id number] OT Time Calculation (min): 32 min  OT Assessment / Plan / Recommendation Clinical Impression  Pt demos decline in function with ADLs and ADL mobility safety following L5-S1 fusion. Pt would benefit form acute OT services to address impairments to help restore PLOF to reutunr home safely    OT Assessment  Patient needs continued OT Services    Follow Up Recommendations  Home health OT;Supervision/Assistance - 24 hour    Barriers to Discharge None    Equipment Recommendations  Tub/shower bench    Recommendations for Other Services    Frequency  Min 2X/week    Precautions / Restrictions Precautions Precautions: Back Precaution Booklet Issued: Yes (comment) Precaution Comments: Pt educatd on back precautions and provided with handout Required Braces or Orthoses: Spinal Brace Spinal Brace: Lumbar corset;Applied in sitting position Restrictions Weight Bearing Restrictions: No   Pertinent Vitals/Pain 6/10 back    ADL  Grooming: Performed;Wash/dry face;Min guard Where Assessed - Grooming: Supported standing Upper Body Bathing: Supervision/safety;Set up;Simulated Lower Body Bathing: Simulated;Moderate assistance Upper Body Dressing: Performed;Set up;Other (comment) (min A to donn back brace) Where Assessed - Upper Body Dressing: Unsupported sitting Lower Body Dressing: Performed;Moderate assistance Toilet Transfer: Performed;Min Psychiatric nurse Method: Sit to Loss adjuster, chartered: Raised toilet seat with arms (or 3-in-1 over toilet);Grab bars Toileting - Clothing Manipulation and Hygiene: Minimal assistance Where Assessed - Camera operator Manipulation and Hygiene: Standing Equipment Used: Back brace;Long-handled shoe horn;Long-handled sponge;Reacher;Rolling walker;Gait belt;Sock aid;Other (comment) (3 in 1 over  toilet) Transfers/Ambulation Related to ADLs: verbal cues for saftey, correch hand placement ADL Comments: pt and husband provided with education and demo of ADL A/E for home use    OT Diagnosis: Generalized weakness;Acute pain  OT Problem List: Decreased strength;Decreased knowledge of use of DME or AE;Decreased knowledge of precautions;Decreased activity tolerance;Impaired balance (sitting and/or standing);Pain OT Treatment Interventions: Self-care/ADL training;Balance training;Therapeutic exercise;Neuromuscular education;Therapeutic activities;DME and/or AE instruction;Patient/family education   OT Goals Acute Rehab OT Goals OT Goal Formulation: With patient/family Time For Goal Achievement: 07/05/12 Potential to Achieve Goals: Good ADL Goals Pt Will Perform Grooming: with set-up;with supervision;Standing at sink ADL Goal: Grooming - Progress: Goal set today Pt Will Perform Lower Body Bathing: with min assist;with adaptive equipment;with mod assist ADL Goal: Lower Body Bathing - Progress: Goal set today Pt Will Perform Lower Body Dressing: with min assist;with mod assist ADL Goal: Lower Body Dressing - Progress: Goal set today Pt Will Transfer to Toilet: with supervision;with DME;Grab bars ADL Goal: Toilet Transfer - Progress: Goal set today Pt Will Perform Toileting - Clothing Manipulation: with supervision;Standing ADL Goal: Toileting - Clothing Manipulation - Progress: Goal set today Pt Will Perform Toileting - Hygiene: with supervision;with min assist;with adaptive equipment ADL Goal: Toileting - Hygiene - Progress: Goal set today Pt Will Perform Tub/Shower Transfer: with supervision;with min assist;with DME;Grab bars ADL Goal: Tub/Shower Transfer - Progress: Goal set today  Visit Information  Last OT Received On: 06/28/12 Assistance Needed: +1 PT/OT Co-Evaluation/Treatment: Yes    Subjective Data  Subjective: " I have been through this before ' Patient Stated Goal: To  return home   Prior Functioning     Home Living Lives With: Spouse Available Help at Discharge: Family;Available 24 hours/day Type of Home: House Home Access: Stairs to enter [employer] of Steps: 2 Entrance Stairs-Rails: Right Home Layout: One level Bathroom Shower/Tub: Tub/shower unit;Curtain Bathroom Toilet: Handicapped height Bathroom Accessibility: Yes  How Accessible: Accessible via walker Home Adaptive Equipment: Walker - rolling;Straight cane;Bedside commode/3-in-1;Grab bars in shower Prior Function Level of Independence: Independent with assistive device(s) Able to Take Stairs?: Yes Driving: Yes Vocation: Retired Comments: using walker PTA for last 2 months Communication Communication: No difficulties Dominant Hand: Right         Vision/Perception Vision - History Baseline Vision: Wears glasses all the time Patient Visual Report: No change from baseline Perception Perception: Within Functional Limits   Cognition  Cognition Arousal/Alertness: Awake/alert Behavior During Therapy: WFL for tasks assessed/performed Overall Cognitive Status: Within Functional Limits for tasks assessed    Extremity/Trunk Assessment Right Upper Extremity Assessment RUE ROM/Strength/Tone: WFL for tasks assessed RUE Sensation: WFL - Light Touch RUE Coordination: WFL - gross/fine motor Left Upper Extremity Assessment LUE ROM/Strength/Tone: WFL for tasks assessed LUE Sensation: WFL - Light Touch LUE Coordination: WFL - gross/fine motor     Mobility Bed Mobility Bed Mobility: Rolling Right;Right Sidelying to Sit;Sitting - Scoot to Edge of Bed Rolling Right: 5: Supervision;With rail Right Sidelying to Sit: 4: Min assist;HOB flat;With rails Sitting - Scoot to Edge of Bed: 4: Min guard Details for Bed Mobility Assistance: vc to maintain back precuations; pt required incr time and effort to transition to sitting with assist to maintain balance Transfers Transfers: Sit  to Stand;Stand to Sit Sit to Stand: 4: Min assist Stand to Sit: 4: Min guard Details for Transfer Assistance: x 2; vc for safety and sequencing     Exercise     Balance Balance Balance Assessed: Yes Static Standing Balance Static Standing - Balance Support: No upper extremity supported Static Standing - Level of Assistance: 5: Stand by assistance Dynamic Standing Balance Dynamic Standing - Balance Support: No upper extremity supported;During functional activity Dynamic Standing - Level of Assistance: 4: Min assist   End of Session OT - End of Session Equipment Utilized During Treatment: Gait belt;Other (comment) (RW, 3 in 1, ADL A/E) Activity Tolerance: Patient tolerated treatment well Patient left: in chair;with call bell/phone within reach;with family/visitor present  GO     Britt Bottom 06/28/2012, 11:30 AM

## 2012-06-28 NOTE — Plan of Care (Signed)
Problem: Phase II Progression Outcomes Goal: Discharge plan established Recommend [facility] OT/24 hr sup/assist after acute care d/c

## 2012-06-28 NOTE — Evaluation (Signed)
Physical Therapy Evaluation Patient Details Name: [name] MRN: [id number] DOB: June 10, 1933 Today's Date: 06/28/2012 Time: [id number] PT Time Calculation (min): 27 min  PT Assessment / Plan / Recommendation Clinical Impression    Patient is s/p L5-S1 fusion resulting in the deficits listed below (PT Problem List). Patient will benefit from skilled PT to increase their independence and safety with mobility (while adhering to their back precautions) to allow discharge home with family's assist.      PT Assessment  Patient needs continued PT services    Follow Up Recommendations  Home health PT;Supervision for mobility/OOB    Does the patient have the potential to tolerate intense rehabilitation      Barriers to Discharge None      Equipment Recommendations  None recommended by PT    Recommendations for Other Services     Frequency Min 5X/week    Precautions / Restrictions Precautions Precautions: Back Precaution Booklet Issued: Yes (comment) Required Braces or Orthoses: Spinal Brace Spinal Brace: Lumbar corset;Applied in sitting position   Pertinent Vitals/Pain "not much" low back      Mobility  Bed Mobility Bed Mobility: Rolling Right;Right Sidelying to Sit;Sitting - Scoot to Edge of Bed Rolling Right: 5: Supervision;With rail Right Sidelying to Sit: 4: Min assist;HOB flat;With rails Sitting - Scoot to Edge of Bed: 4: Min guard Details for Bed Mobility Assistance: vc to maintain back precuations; pt required incr time and effort to transition to sitting with assist to maintain balance Transfers Transfers: Sit to Stand;Stand to Sit Sit to Stand: 4: Min assist Stand to Sit: 4: Min guard Details for Transfer Assistance: x 2; vc for safety and sequencing Ambulation/Gait Ambulation/Gait Assistance: 4: Min guard Ambulation Distance (Feet): 25 Feet Assistive device: Rolling walker Ambulation/Gait Assistance Details: vc for upright posture, keeping safe distance to  RW; maneuvering RW Gait Pattern: Step-through pattern;Decreased stride length;Trunk flexed         PT Diagnosis: Difficulty walking;Acute pain  PT Problem List: Decreased activity tolerance;Decreased mobility;Decreased knowledge of use of DME;Decreased knowledge of precautions;Cardiopulmonary status limiting activity;Pain PT Treatment Interventions:     PT Goals Acute Rehab PT Goals PT Goal Formulation: With patient/family Time For Goal Achievement: 07/02/12 Potential to Achieve Goals: Good Pt will go Supine/Side to Sit: with supervision PT Goal: Supine/Side to Sit - Progress: Goal set today Pt will go Sit to Supine/Side: with supervision PT Goal: Sit to Supine/Side - Progress: Goal set today Pt will go Sit to Stand: with supervision PT Goal: Sit to Stand - Progress: Goal set today Pt will Ambulate: 51 - 150 feet;with supervision PT Goal: Ambulate - Progress: Goal set today Pt will Go Up / Down Stairs: 1-2 stairs;with rail(s) PT Goal: Up/Down Stairs - Progress: Goal set today Additional Goals Additional Goal #1: Pt able to verbalize and adhere to back precautions while mobilizing PT Goal: Additional Goal #1 - Progress: Goal set today  Visit Information  Last PT Received On: 06/28/12 Assistance Needed: +1    Subjective Data  Patient Stated Goal: return home with asssit   Prior Functioning  Home Living Lives With: Spouse Available Help at Discharge: Family;Available 24 hours/day (including sister and daughter to help) Type of Home: House Home Access: Stairs to enter [employer] of Steps: 2 Entrance Stairs-Rails: Right Home Layout: One level Bathroom Shower/Tub: Product/process development scientist: Handicapped height Bathroom Accessibility: Yes How Accessible: Accessible via walker (only if turn RW sideways) Home Adaptive Equipment: Walker - rolling;Straight cane;Bedside commode/3-in-1;Grab bars in shower Prior  Function Level of Independence: Independent  with assistive device(s) Able to Take Stairs?: Yes Driving: Yes Comments: using walker PTA for last 2 months Dominant Hand: Right    Cognition  Cognition Arousal/Alertness: Awake/alert Behavior During Therapy: WFL for tasks assessed/performed Overall Cognitive Status: Within Functional Limits for tasks assessed    Extremity/Trunk Assessment Right Lower Extremity Assessment RLE ROM/Strength/Tone: [facility] for tasks assessed Left Lower Extremity Assessment LLE ROM/Strength/Tone: [facility] for tasks assessed Trunk Assessment Trunk Assessment: Other exceptions (tends towards flexion)   Balance    End of Session PT - End of Session Equipment Utilized During Treatment: Gait belt;Back brace Activity Tolerance: Treatment limited secondary to medical complications (Comment) (decr SaO2) Patient left: in chair;with call bell/phone within reach;with family/visitor present  GP     [name] 06/28/2012, 10:55 AM Pager [phone]

## 2012-06-28 NOTE — Progress Notes (Signed)
Subjective: Patient reports "My back itched some last night" "I haven't used much of the medicine this morning"  Objective: Vital signs in last 24 hours: Temp:  [97.2 F (36.2 C)-99.3 F (37.4 C)] 98.3 F (36.8 C) (05/02 0642) Pulse Rate:  [67-92] 92 (05/02 0642) Resp:  [11-22] 18 (05/02 0642) BP: (105-136)/(37-73) 136/63 mmHg (05/02 0642) SpO2:  [92 %-99 %] 94 % (05/02 0642)  Intake/Output from previous day: 05/01 0701 - 05/02 0700 In: 2990 [P.O.:240; I.V.:2500; IV Piggyback:250] Out: 2630 [Urine:1615; Drains:715; Blood:300] Intake/Output this shift:    Alert. conversant. Up to [facility] last pm, looking forward to PT today. Good strength BLE. Drsg intact, clean, dry. Hemovac patent ~255ml last pm. Incision with Steri's & DSD. No rash or erythema beneath drsg.   Lab Results:  Recent Labs  06/27/12 0641 06/27/12 0943  WBC 8.1  --   HGB 11.5* 10.5*  HCT 35.8* 31.0*  PLT 199  --    BMET  Recent Labs  06/27/12 0641  06/27/12 0943 06/27/12 1057  NA 140  --  142  --   K 3.6  --  4.2  --   CL 103  --   --   --   CO2 24  --   --   --   GLUCOSE 90  < > 90 35*  BUN 19  --   --   --   CREATININE 0.71  --   --   --   CALCIUM 9.5  --   --   --   < > = values in this interval not displayed.  Studies/Results: Dg Lumbar Spine 2-3 Views  06/27/2012  *RADIOLOGY REPORT*  Clinical Data: Redo lumbar fusion.  DG C-ARM 1-60 MIN,LUMBAR SPINE - 2-3 VIEW  Technique:  C-arm fluoroscopic images were obtained intraoperatively and submitted for postoperative interpretation. Please see the performing provider's procedural report for the fluoroscopy time utilized.  Comparison: MRI 05/27/2012.  Findings: Lumbar fusion has been extended inferiorly to involve the L5 S1 level with pedicle screw and interbody cages.  IMPRESSION: As above.   Original Report Authenticated By: Rolla Flatten, M.D.    Dg Lumbar Spine 1 View  06/27/2012  *RADIOLOGY REPORT*  Clinical Data: Review of lumbar fusion.  LUMBAR SPINE -  1 VIEW  Comparison: MRI of the lumbar spine 05/27/2012.  Findings: A single cross-table lateral view of the lumbar spine demonstrates soft tissue retractors in place posterior to L4-S1, with a surgical probe in place posterior to the L5-S1 interspace. Postoperative changes of PLIF are again noted from L2-L5 with interbody grafts at L2-L3, L3 - L4-L4 - L5.  IMPRESSION: 1.  Intraoperative localization, as above.   Original Report Authenticated By: Vinnie Langton, M.D.    Dg C-arm 1-60 Min  06/27/2012  *RADIOLOGY REPORT*  Clinical Data: Redo lumbar fusion.  DG C-ARM 1-60 MIN,LUMBAR SPINE - 2-3 VIEW  Technique:  C-arm fluoroscopic images were obtained intraoperatively and submitted for postoperative interpretation. Please see the performing provider's procedural report for the fluoroscopy time utilized.  Comparison: MRI 05/27/2012.  Findings: Lumbar fusion has been extended inferiorly to involve the L5 S1 level with pedicle screw and interbody cages.  IMPRESSION: As above.   Original Report Authenticated By: Rolla Flatten, M.D.     Assessment/Plan: Improving   LOS: 1 day  Continue to mobilize in LSO with PT. Hopefully bed bath with removal of betadine will decrease itching.    Verdis Prime 06/28/2012, 9:22 AM

## 2012-06-28 NOTE — Care Management Note (Signed)
    Page 1 of 1   07/01/2012     12:12:27 PM   CARE MANAGEMENT NOTE 07/01/2012  Patient:  [name], [name]   Account Number:  [id number]  Date Initiated:  06/27/2012  Documentation initiated by:  [facility]  Subjective/Objective Assessment:   admitted postop TLIF L5-S1     Action/Plan:   PT/OT evals-recommending HHPT and HHOT   Anticipated DC Date:  06/30/2012   Anticipated DC Plan:  [place]  CM consult      Choice offered to / List presented to:  C-1 Patient        [place] arranged  [place].   Status of service:  Completed, signed off Medicare Important Message given?   (If response is "NO", the following Medicare IM given date fields will be blank) Date Medicare IM given:   Date Additional Medicare IM given:    Discharge Disposition:  [place]  Per UR Regulation:  Reviewed for med. necessity/level of care/duration of stay  If discussed at [place] of Stay Meetings, dates discussed:    Comments:  07/01/12 Spoke with patient and her daughter about [place], they chose [facility] for HHPT/OT. Contacted Jarious Lyon with [facility] and set up [place] and [place]. Fuller Plan RN, BSN, CCM   06/28/12 Spoke with patient about [place] and gave her [facility] list of agencies. She will review the list, she ia agreeable with [facility]. CM will continue to follow. Fuller Plan RN, BSN, CCM

## 2012-06-29 LAB — GLUCOSE, CAPILLARY
Glucose-Capillary: 86 mg/dL (ref 70–99)
Glucose-Capillary: 78 mg/dL (ref 70–99)
Glucose-Capillary: 45 mg/dL — ABNORMAL LOW (ref 70–99)
Glucose-Capillary: 47 mg/dL — ABNORMAL LOW (ref 70–99)
Glucose-Capillary: 80 mg/dL (ref 70–99)

## 2012-06-29 NOTE — Progress Notes (Signed)
Physical Therapy Treatment Patient Details Name: [name] MRN: [id number] DOB: 08-24-1933 Today's Date: 06/29/2012 Time: [id number] PT Time Calculation (min): 24 min  PT Assessment / Plan / Recommendation Comments on Treatment Session  Pt making steady progress with mobility at this date.  Pt reports she & MD had spoke about possible d/c home Monday.  Pt will need to practice steps before d/c.      Follow Up Recommendations  Home health PT;Supervision for mobility/OOB     Does the patient have the potential to tolerate intense rehabilitation     Barriers to Discharge        Equipment Recommendations  None recommended by PT    Recommendations for Other Services    Frequency Min 5X/week   Plan Discharge plan remains appropriate    Precautions / Restrictions Precautions Precautions: Back Precaution Comments: Pt verbalized 2/3 back precautions.  Reviewed all 3 precautions Required Braces or Orthoses: Spinal Brace Spinal Brace: Lumbar corset;Applied in sitting position Restrictions Weight Bearing Restrictions: No       Mobility  Bed Mobility Bed Mobility: Supine to Sit Rolling Right: 5: Supervision Right Sidelying to Sit: 5: Supervision;HOB flat Supine to Sit: 5: Supervision Details for Bed Mobility Assistance: pt. able to complete bed mobility with hob flat and no rails to simulate home environment Transfers Transfers: Sit to Stand;Stand to Sit Sit to Stand: 4: Min guard;From chair/3-in-1;From toilet;From bed;With armrests Stand to Sit: 4: Min guard Details for Transfer Assistance: Cues for hand placement, body positioning before sitting, & technique.   Ambulation/Gait Ambulation/Gait Assistance: 4: Min guard Ambulation Distance (Feet): 100 Feet Assistive device: Rolling walker Ambulation/Gait Assistance Details: Cues for tall posture.  As distance increases, pt fatigues & requires increased cues for upright posture.   Gait Pattern: Step-through pattern;Decreased  stride length;Trunk flexed Stairs: No Wheelchair Mobility Wheelchair Mobility: No     PT Goals Acute Rehab PT Goals Time For Goal Achievement: 07/02/12 Potential to Achieve Goals: Good Pt will go Supine/Side to Sit: with supervision PT Goal: Supine/Side to Sit - Progress: Met Pt will go Sit to Supine/Side: with supervision Pt will go Sit to Stand: with supervision PT Goal: Sit to Stand - Progress: Progressing toward goal Pt will Ambulate: 51 - 150 feet;with supervision PT Goal: Ambulate - Progress: Progressing toward goal Pt will Go Up / Down Stairs: 1-2 stairs;with rail(s) Additional Goals Additional Goal #1: Pt able to verbalize and adhere to back precautions while mobilizing PT Goal: Additional Goal #1 - Progress: Progressing toward goal  Visit Information  Last PT Received On: 06/29/12 Assistance Needed: +1    Subjective Data      Cognition  Cognition Arousal/Alertness: Awake/alert Behavior During Therapy: WFL for tasks assessed/performed Overall Cognitive Status: Within Functional Limits for tasks assessed    Balance     End of Session PT - End of Session Equipment Utilized During Treatment: Gait belt;Back brace Activity Tolerance: Patient tolerated treatment well Patient left: in chair;with call bell/phone within reach Nurse Communication: Mobility status     Sarajane Marek, [place] 06/29/2012

## 2012-06-29 NOTE — Progress Notes (Signed)
Occupational Therapy Treatment Patient Details Name: [name] MRN: [id number] DOB: [birth year] Today's Date: 06/29/2012 Time: [id number] OT Time Calculation (min): 24 min  OT Assessment / Plan / Recommendation Comments on Treatment Session      Follow Up Recommendations  Home health OT           Equipment Recommendations  Tub/shower bench (will see if she has one to borrow vs. order one)        Frequency Min 2X/week         Precautions / Restrictions Precautions Precautions: Back Spinal Brace: Lumbar corset;Applied in sitting position   Pertinent Vitals/Pain 4/10, repositioned    ADL  Grooming: Performed;Wash/dry hands Where Assessed - Grooming: Supported standing Lower Body Bathing:  (states no a/e needs family to assist) Lower Body Dressing:  (states no a/e needs family to assist) Toilet Transfer: Performed;Min Conservator, museum/gallery: Regular height toilet;Grab bars Toileting - Water quality scientist and Hygiene: Supervision/safety Where Assessed - Best boy and Hygiene: Standing (cues for maintaining back precautions with hygiene) Tub/Shower Transfer:  (pt. unable today due to c/o fatigue once up amb.) Transfers/Ambulation Related to ADLs: amb. to/from b.room with rw and in hallway, cues for staying inside the walker and maintaining proper posture. c/o fatigue in arms cues to walk using legs also ADL Comments: declines a/e states husband and other family will asisst with lb needs, unable to practice tub transfer due to c/o fatigue.  pt. checking with neighbor to see about borrowing a tub chair, following up today and should know tom. (sun) what her equipment needs will be, and also practice tub transfer or defer to hhot        OT Goals ADL Goals ADL Goal: Grooming - Progress: Progressing toward goals ADL Goal: Lower Body Bathing - Progress: Discontinued (comment) (family to assist) ADL Goal: Lower Body Dressing - Progress:  Discontinued (comment) (family to assist) ADL Goal: Armed forces technical officer - Progress: Progressing toward goals ADL Goal: Toileting - Clothing Manipulation - Progress: Progressing toward goals ADL Goal: Toileting - Hygiene - Progress: Progressing toward goals ADL Goal: Tub/Shower Transfer - Progress: Not progressing (unable today due to reported fatigue)  Visit Information  Last OT Received On: 06/29/12    Subjective Data  Subjective: "i have been getting to and from the bathroom"   Prior Functioning       Cognition  Cognition Arousal/Alertness: Awake/alert Behavior During Therapy: WFL for tasks assessed/performed Overall Cognitive Status: Within Functional Limits for tasks assessed    Mobility  Bed Mobility Bed Mobility: Supine to Sit Rolling Right: 5: Supervision Right Sidelying to Sit: 5: Supervision;HOB flat Supine to Sit: 5: Supervision Details for Bed Mobility Assistance: pt. able to complete bed mobility with hob flat and no rails to simulate home environment Transfers Transfers: Sit to Stand;Stand to Sit Sit to Stand: 4: Min guard;From chair/3-in-1;From toilet;From bed;With armrests Stand to Sit: 4: Min guard    Exercises      Balance     End of Session OT - End of Session Equipment Utilized During Treatment: Back brace Activity Tolerance: Patient tolerated treatment well Patient left: in chair;with call bell/phone within reach       Janice Coffin 06/29/2012, 8:34 AM

## 2012-06-29 NOTE — Progress Notes (Signed)
Patient ID: [name], female   DOB: Oct 11, 1933, 79 y.o.   MRN: [id number] Subjective: Patient reports doing well  Objective: Vital signs in last 24 hours: Temp:  [98.3 F (36.8 C)-100.8 F (38.2 C)] 98.4 F (36.9 C) (05/03 0557) Pulse Rate:  [83-102] 85 (05/03 0557) Resp:  [14-22] 18 (05/03 0557) BP: (115-143)/(53-76) 127/58 mmHg (05/03 0557) SpO2:  [92 %-99 %] 92 % (05/03 0557) Weight:  [82 kg (180 lb 12.4 oz)] 82 kg (180 lb 12.4 oz) (05/03 0300)  Intake/Output from previous day: 05/02 0701 - 05/03 0700 In: -  Out: 960 [Urine:650; Drains:310] Intake/Output this shift:    Wound:clean and dry  Lab Results:  Recent Labs  06/27/12 0641 06/27/12 0943  WBC 8.1  --   HGB 11.5* 10.5*  HCT 35.8* 31.0*  PLT 199  --    BMET  Recent Labs  06/27/12 0641  06/27/12 0943 06/27/12 1057  NA 140  --  142  --   K 3.6  --  4.2  --   CL 103  --   --   --   CO2 24  --   --   --   GLUCOSE 90  < > 90 35*  BUN 19  --   --   --   CREATININE 0.71  --   --   --   CALCIUM 9.5  --   --   --   < > = values in this interval not displayed.  Studies/Results: Dg Lumbar Spine 2-3 Views  06/27/2012  *RADIOLOGY REPORT*  Clinical Data: Redo lumbar fusion.  DG C-ARM 1-60 MIN,LUMBAR SPINE - 2-3 VIEW  Technique:  C-arm fluoroscopic images were obtained intraoperatively and submitted for postoperative interpretation. Please see the performing provider's procedural report for the fluoroscopy time utilized.  Comparison: MRI 05/27/2012.  Findings: Lumbar fusion has been extended inferiorly to involve the L5 S1 level with pedicle screw and interbody cages.  IMPRESSION: As above.   Original Report Authenticated By: Rolla Flatten, M.D.    Dg Lumbar Spine 1 View  06/27/2012  *RADIOLOGY REPORT*  Clinical Data: Review of lumbar fusion.  LUMBAR SPINE - 1 VIEW  Comparison: MRI of the lumbar spine 05/27/2012.  Findings: A single cross-table lateral view of the lumbar spine demonstrates soft tissue retractors in  place posterior to L4-S1, with a surgical probe in place posterior to the L5-S1 interspace. Postoperative changes of PLIF are again noted from L2-L5 with interbody grafts at L2-L3, L3 - L4-L4 - L5.  IMPRESSION: 1.  Intraoperative localization, as above.   Original Report Authenticated By: Vinnie Langton, M.D.    Dg C-arm 1-60 Min  06/27/2012  *RADIOLOGY REPORT*  Clinical Data: Redo lumbar fusion.  DG C-ARM 1-60 MIN,LUMBAR SPINE - 2-3 VIEW  Technique:  C-arm fluoroscopic images were obtained intraoperatively and submitted for postoperative interpretation. Please see the performing provider's procedural report for the fluoroscopy time utilized.  Comparison: MRI 05/27/2012.  Findings: Lumbar fusion has been extended inferiorly to involve the L5 S1 level with pedicle screw and interbody cages.  IMPRESSION: As above.   Original Report Authenticated By: Rolla Flatten, M.D.     Assessment/Plan: Doing welll pod 2. Thinks she will probably stay til Monday.   LOS: 2 days  as above   Faythe Ghee, MD 06/29/2012, 7:42 AM

## 2012-06-30 LAB — GLUCOSE, CAPILLARY
Glucose-Capillary: 228 mg/dL — ABNORMAL HIGH (ref 70–99)
Glucose-Capillary: 78 mg/dL (ref 70–99)

## 2012-06-30 NOTE — Significant Event (Signed)
Hypoglycemic Event  CBG: 55  Treatment: 15 GM carbohydrate snack  Symptoms: None  Follow-up CBG: Time:22:30 CBG Result:53                            Time:23:00   CBG Result:64                            Time:12MN   CBG Result:78  Possible Reasons for Event: Inadequate meal intake  Comments/MD notified:     [name], [name]  Remember to initiate Hypoglycemia Order Set & complete

## 2012-06-30 NOTE — Progress Notes (Signed)
Occupational Therapy Treatment Patient Details Name: [name] MRN: [id number] DOB: June 19, 1933 Today's Date: 06/30/2012 Time: [id number] OT Time Calculation (min): 24 min  OT Assessment / Plan / Recommendation Comments on Treatment Session  Pt is progressing, but is limited by pain and fatigue today.  Requires min verbal cues for back precautions and walker safety during BADLs.    Follow Up Recommendations  Home health OT; 24 hour supervision   Barriers to Discharge       Equipment Recommendations  Tub/shower bench    Recommendations for Other Services    Frequency Min 2X/week   Plan Discharge plan remains appropriate    Precautions / Restrictions Precautions Precautions: Back Precaution Booklet Issued: Yes (comment) Precaution Comments: Able to recall 2/3 precautions.  Requires min verbal cues to adhere to precautions during ADL Required Braces or Orthoses: Spinal Brace Spinal Brace: Lumbar corset;Applied in sitting position Restrictions Weight Bearing Restrictions: No   Pertinent Vitals/Pain     ADL  Grooming: Wash/dry face;Wash/dry hands;Teeth care;Supervision/safety Where Assessed - Grooming: Supported standing Lower Body Dressing: Moderate assistance Where Assessed - Lower Body Dressing: Supported sit to Lobbyist: Magazine features editor Method: Sit to Loss adjuster, chartered: Comfort height toilet;Grab bars Toileting - Water quality scientist and Hygiene: Moderate assistance Where Assessed - Toileting Clothing Manipulation and Hygiene: Standing Equipment Used: Rolling walker;Back brace Transfers/Ambulation Related to ADLs: min guard assist with RW ADL Comments: Pt requires encouragement to participate.  Pt unable to access peri area and was instructed in options for toileting aids.  Pt able to cross Lt. ankle over Rt. knee to don slipper with minA, but unable to do so with Rt. LE.  Pt reports spouse also has bad back and is to be her  primary caregiver.  He did not attempt to assist her during treatment session.  Pt refused tub transfer practice this date    OT Diagnosis:    OT Problem List:   OT Treatment Interventions:     OT Goals Acute Rehab OT Goals OT Goal Formulation: With patient/family Time For Goal Achievement: 07/05/12 Potential to Achieve Goals: Good ADL Goals ADL Goal: Grooming - Progress: Progressing toward goals Pt Will Perform Lower Body Bathing: with min assist;with adaptive equipment;with mod assist Pt Will Perform Lower Body Dressing: with min assist;with mod assist ADL Goal: Lower Body Dressing - Progress: Progressing toward goals Pt Will Transfer to Toilet: with supervision;with DME;Grab bars ADL Goal: Toilet Transfer - Progress: Progressing toward goals Pt Will Perform Toileting - Clothing Manipulation: with supervision;Standing ADL Goal: Toileting - Clothing Manipulation - Progress: Progressing toward goals Pt Will Perform Toileting - Hygiene: with supervision;with min assist;with adaptive equipment ADL Goal: Toileting - Hygiene - Progress: Progressing toward goals Pt Will Perform Tub/Shower Transfer: with supervision;with min assist;with DME;Grab bars  Visit Information  Last OT Received On: 06/30/12 Assistance Needed: +1    Subjective Data      Prior Functioning       Cognition  Cognition Arousal/Alertness: Awake/alert Behavior During Therapy: WFL for tasks assessed/performed Overall Cognitive Status: Within Functional Limits for tasks assessed    Mobility  Bed Mobility Bed Mobility: Rolling Right;Right Sidelying to Sit;Sitting - Scoot to Edge of Bed Rolling Right: 5: Supervision;With rail Right Sidelying to Sit: 5: Supervision;HOB flat;With rails Details for Bed Mobility Assistance: Pt requires supervision for safe technique Transfers Transfers: Sit to Stand;Stand to Sit Sit to Stand: 4: Min guard;With upper extremity assist;From bed;From toilet Stand to Sit: 4: Min  guard;With upper  extremity assist;To chair/3-in-1;To toilet Details for Transfer Assistance: cues for hand placement and walker safety    Exercises      Balance     End of Session OT - End of Session Equipment Utilized During Treatment: Back brace Activity Tolerance: Patient limited by fatigue Patient left: in chair;with call bell/phone within reach;with family/visitor present  [place], Ellard Artis M 06/30/2012, 12:00 PM

## 2012-06-30 NOTE — Progress Notes (Signed)
Subjective: Patient reports She's doing well no leg pain to his back soreness she's been ambulating well  Objective: Vital signs in last 24 hours: Temp:  [97.7 F (36.5 C)-100.4 F (38 C)] 99 F (37.2 C) (05/04 0611) Pulse Rate:  [85-104] 97 (05/04 0611) Resp:  [18-20] 20 (05/04 0611) BP: (123-150)/(58-81) 147/63 mmHg (05/04 0611) SpO2:  [93 %-96 %] 93 % (05/04 0611)  Intake/Output from previous day: 05/03 0701 - 05/04 0700 In: 363 [P.O.:360; I.V.:3] Out: 60 [Drains:60] Intake/Output this shift:    Neurologically stable wound had some mild saturation  Lab Results:  Recent Labs  06/27/12 0943  HGB 10.5*  HCT 31.0*   BMET  Recent Labs  06/27/12 0943 06/27/12 1057  NA 142  --   K 4.2  --   GLUCOSE 90 35*    Studies/Results: No results found.  Assessment/Plan: Progressive mobilization with physical therapy we'll DC her Hemovac and change her dressing  LOS: 3 days     Ashlin Kreps P 06/30/2012, 9:06 AM

## 2012-06-30 NOTE — Progress Notes (Signed)
Physical Therapy Treatment Patient Details Name: [name] MRN: [id number] DOB: 01-Apr-1933 Today's Date: 06/30/2012 Time: [id number] PT Time Calculation (min): 25 min  PT Assessment / Plan / Recommendation Comments on Treatment Session   Pt progressing with mobility.  Performed stairs this session.      Follow Up Recommendations  Home health PT;Supervision for mobility/OOB     Does the patient have the potential to tolerate intense rehabilitation     Barriers to Discharge        Equipment Recommendations  None recommended by PT    Recommendations for Other Services    Frequency Min 5X/week   Plan Discharge plan remains appropriate    Precautions / Restrictions Precautions Precautions: Back Precaution Booklet Issued: Yes (comment) Precaution Comments: Able to recall 2/3 precautions.  Requires min verbal cues to adhere to precautions during ADL Required Braces or Orthoses: Spinal Brace Spinal Brace: Lumbar corset;Applied in sitting position Restrictions Weight Bearing Restrictions: No       Mobility  Bed Mobility Bed Mobility: Not assessed Rolling Right: 5: Supervision;With rail Right Sidelying to Sit: 5: Supervision;HOB flat;With rails Details for Bed Mobility Assistance: Pt requires supervision for safe technique Transfers Transfers: Sit to Stand;Stand to Sit Sit to Stand: 5: Supervision;With upper extremity assist;With armrests;From chair/3-in-1 Stand to Sit: 5: Supervision;With upper extremity assist;With armrests;To chair/3-in-1 Details for Transfer Assistance: cues for hand placement and walker safety Ambulation/Gait Ambulation/Gait Assistance: 4: Min guard Ambulation Distance (Feet): 175 Feet Assistive device: Rolling walker Ambulation/Gait Assistance Details: Cues to relax UE's, erect posture.  Gait Pattern: Step-through pattern;Decreased stride length;Trunk flexed (decreased floor clearance) Gait velocity: decreased Stairs: Yes Stairs Assistance: 4:  Min guard Stair Management Technique: One rail Right;Sideways Number of Stairs: 2 (2x's) Wheelchair Mobility Wheelchair Mobility: No           PT Goals Acute Rehab PT Goals Time For Goal Achievement: 07/02/12 Potential to Achieve Goals: Good Pt will go Supine/Side to Sit: with supervision Pt will go Sit to Supine/Side: with supervision Pt will go Sit to Stand: with supervision PT Goal: Sit to Stand - Progress: Met Pt will Ambulate: 51 - 150 feet;with supervision PT Goal: Ambulate - Progress: Progressing toward goal Pt will Go Up / Down Stairs: 1-2 stairs;with rail(s) PT Goal: Up/Down Stairs - Progress: Progressing toward goal Additional Goals Additional Goal #1: Pt able to verbalize and adhere to back precautions while mobilizing PT Goal: Additional Goal #1 - Progress: Progressing toward goal  Visit Information  Last PT Received On: 06/30/12 Assistance Needed: +1    Subjective Data      Cognition  Cognition Arousal/Alertness: Awake/alert Behavior During Therapy: WFL for tasks assessed/performed Overall Cognitive Status: Within Functional Limits for tasks assessed    Balance     End of Session PT - End of Session Equipment Utilized During Treatment: Gait belt;Back brace Activity Tolerance: Patient tolerated treatment well Patient left: in chair;with call bell/phone within reach;with family/visitor present Nurse Communication: Mobility status     Sarajane Marek, [place] [phone] 06/30/2012

## 2012-06-30 NOTE — Significant Event (Signed)
Hypoglycemic Event  CBG:47  Treatment: 15 GM carbohydrate snack  Symptoms: None  Follow-up CBG: Time:1738 CBG Result: 80  Possible Reasons for Event: Inadequate meal intake  Comments/MD notified: asymptomatic     [name]  Remember to initiate Hypoglycemia Order Set & complete

## 2012-07-01 LAB — GLUCOSE, CAPILLARY
Glucose-Capillary: 52 mg/dL — ABNORMAL LOW (ref 70–99)
Glucose-Capillary: 135 mg/dL — ABNORMAL HIGH (ref 70–99)
Glucose-Capillary: 190 mg/dL — ABNORMAL HIGH (ref 70–99)

## 2012-07-01 MED ORDER — METHYLPREDNISOLONE 4 MG PO KIT: PACK | ORAL | Status: DC

## 2012-07-01 MED ORDER — METHYLPREDNISOLONE 4 MG PO KIT: 4.0000 mg | PACK | ORAL | Status: DC

## 2012-07-01 MED ORDER — METHYLPREDNISOLONE 4 MG PO KIT: 8.0000 mg | PACK | Freq: Every evening | ORAL | Status: DC

## 2012-07-01 MED ORDER — OXYCODONE-ACETAMINOPHEN 5-325 MG PO TABS: 1.0000 | ORAL_TABLET | ORAL | Status: DC | PRN

## 2012-07-01 MED ORDER — METHYLPREDNISOLONE 4 MG PO KIT
8.0000 mg | PACK | Freq: Every morning | ORAL | Status: AC
  Administered 2012-07-01: 8 mg via ORAL
  Filled 2012-07-01: qty 21

## 2012-07-01 MED ORDER — METHYLPREDNISOLONE 4 MG PO KIT: 4.0000 mg | PACK | Freq: Three times a day (TID) | ORAL | Status: DC

## 2012-07-01 MED ORDER — METHYLPREDNISOLONE 4 MG PO KIT: 4.0000 mg | PACK | Freq: Four times a day (QID) | ORAL | Status: DC

## 2012-07-01 NOTE — Discharge Summary (Signed)
Physician Discharge Summary  Patient ID: [name] MRN: [id number] DOB/AGE: 1933-08-22 79 y.o.  Admit date: 06/27/2012 Discharge date: 07/01/2012  Admission Diagnoses: Lumbar stenosis, Spondylolisthesis, Recurrent lumbar disc herniation, Lumbar radiculopathy    Discharge Diagnoses: Lumbar stenosis, Spondylolisthesis, Recurrent lumbar disc herniation, Lumbar radiculopathy s/p Transforaminal lumbar interbody fusion Lumbar five-sacral one (N/A) - Redo Lumbar five-sacral one Laminectomy/Fusion with PEEK interbody cage, redo laminectomy and discectomy with pedicle screw fixation with posterolateral arthrodesis   Active Problems:   * No active hospital problems. *   Discharged Condition: good  Hospital Course: [name] was admitted for surgery on 06-27-12 with Dx of Lumbar stenosis, Spondylolisthesis, Recurrent lumbar disc herniation, Lumbar radiculopathy. Following uncomplicated redo decompression and fusion, she recovered nicely in Neuro PACU and transferred to 4N for nursing and therapies. She has progressed well.    Consults: None  Significant Diagnostic Studies: radiology: X-Ray: intra-operative  Treatments: surgery: Transforaminal lumbar interbody fusion Lumbar five-sacral one (N/A) - Redo Lumbar five-sacral one Laminectomy/Fusion with PEEK interbody cage, redo laminectomy and discectomy with pedicle screw fixation with posterolateral arthrodesis    Discharge Exam: Blood pressure 151/69, pulse 90, temperature 97.6 F (36.4 C), temperature source Oral, resp. rate 20, height 5\' 3"  (1.6 m), weight 82 kg (180 lb 12.4 oz), SpO2 97.00%. Alert, ambulating from bathroom with PT. Daughter present reports several hypoglycemic episodes over last two days, resolved with po snacks. Rash across buttocks and low back. None within 2" radius of incision. Scattered fluid filled vesicles on back, with a couple on abdomen. Pt denies pain, reporting only itching.  Incision dry, without erythema  or swelling. MD evaluation deemed likely contact dermatitis. Will follow outpatient.     Disposition: 01-Home or Self Care  Discharge Orders   Future Appointments Provider Department Dept Phone   07/23/2012 10:40 AM Merrie Roof, MD [facility], [place] [phone]   Future Orders Complete By Expires     Face-to-face encounter (required for Medicare/Medicaid patients)  As directed     Comments:      I STERN,JOSEPH D certify that this patient is under my care and that I, or a nurse practitioner or physician's assistant working with me, had a face-to-face encounter that meets the physician face-to-face encounter requirements with this patient on 07/01/2012. The encounter with the patient was in whole, or in part for the following medical condition(s) which is the primary reason for home health care (List medical condition): lumbar fusion for spondylolisthesis    Questions:      The encounter with the patient was in whole, or in part, for the following medical condition, which is the primary reason for home health care:  yes    I certify that, based on my findings, the following services are medically necessary home health services:  Nursing    Physical therapy    My clinical findings support the need for the above services:  Pain interferes with ambulation/mobility    Further, I certify that my clinical findings support that this patient is homebound due to:  Unable to leave home safely without assistance    Reason for Medically Necessary Home Health Services:  Skilled Nursing- Post-Surgical Wound Assessment and Care    Therapy- Gait Training, Training and development officer and [place]  As directed     Scheduling Instructions:      Home PT; Nurse for rash    Questions:      To provide the following care/treatments:  RN  PT        Medication List    TAKE these medications       amLODipine 10 MG tablet  Commonly known as:  NORVASC  Take 10 mg by mouth every  morning.     anastrozole 1 MG tablet  Commonly known as:  ARIMIDEX  Take 1 mg by mouth daily with breakfast.     aspirin EC 81 MG tablet  Take 81 mg by mouth daily.     atorvastatin 10 MG tablet  Commonly known as:  LIPITOR  Take 10 mg by mouth at bedtime.     benazepril 20 MG tablet  Commonly known as:  LOTENSIN  Take 20 mg by mouth every morning.     CALCIUM-VITAMIN D PO  Take 1 tablet by mouth daily. 1200 mg/500 mg     docusate sodium 100 MG capsule  Commonly known as:  COLACE  Take 100 mg by mouth 2 (two) times daily as needed for constipation.     DULoxetine 60 MG capsule  Commonly known as:  CYMBALTA  Take 60 mg by mouth at bedtime.     EPIPEN 2-PAK 0.3 mg/0.3 mL Devi  Generic drug:  EPINEPHrine  Inject 0.3 mg into the muscle once as needed. For anaphylaxis     gabapentin 300 MG capsule  Commonly known as:  NEURONTIN  Take 300 mg by mouth 3 (three) times daily.     glyBURIDE-metformin 2.5-500 MG per tablet  Commonly known as:  GLUCOVANCE  Take 2 tablets by mouth 2 (two) times daily with a meal.     hydrALAZINE 25 MG tablet  Commonly known as:  APRESOLINE  Take 25 mg by mouth 3 (three) times daily.     HYDROcodone-acetaminophen 10-325 MG per tablet  Commonly known as:  NORCO  Take 1 tablet by mouth every 8 (eight) hours as needed for pain.     LANTUS SOLOSTAR 100 UNIT/ML injection  Generic drug:  insulin glargine  Inject 50 Units into the skin every morning.     methylPREDNISolone 4 MG tablet  Commonly known as:  MEDROL DOSEPAK  Rash     metoprolol succinate 100 MG 24 hr tablet  Commonly known as:  TOPROL-XL  Take 100 mg by mouth daily. Take with or immediately following a meal.     omeprazole 20 MG capsule  Commonly known as:  PRILOSEC  Take 20 mg by mouth at bedtime.     OPCON-A OP  Place 1 drop into both eyes daily as needed. For dry eyes     oxyCODONE-acetaminophen 5-325 MG per tablet  Commonly known as:  PERCOCET/ROXICET  Take 1-2 tablets  by mouth every 4 (four) hours as needed for pain.     sitaGLIPtin 100 MG tablet  Commonly known as:  JANUVIA  Take 100 mg by mouth every morning.         Signed: Verdis Prime 07/01/2012, 10:22 AM

## 2012-07-01 NOTE — Progress Notes (Signed)
Subjective: Patient reports "I just itch. They're giving me Benadryl. It helps"  Objective: Vital signs in last 24 hours: Temp:  [97.6 F (36.4 C)-98.4 F (36.9 C)] 97.6 F (36.4 C) (05/05 0600) Pulse Rate:  [72-107] 90 (05/05 0600) Resp:  [20] 20 (05/05 0600) BP: (126-158)/(50-69) 151/69 mmHg (05/05 0600) SpO2:  [91 %-97 %] 97 % (05/05 0600)  Intake/Output from previous day: 05/04 0701 - 05/05 0700 In: -  Out: 51 [Drains:50; Stool:1] Intake/Output this shift:    Alert, ambulating from bathroom with PT. Daughter present reports several hypoglycemic episodes over last two days, resolved with po snacks. Rash across buttocks and low back. None within 2" radius of incision. Scattered fluid filled vesicles on back, with a couple on abdomen. Pt denies pain, reporting only itching.  Incision dry, without erythema or swelling.  Lab Results: No results found for this basename: WBC, HGB, HCT, PLT,  in the last 72 hours BMET No results found for this basename: NA, K, CL, CO2, GLUCOSE, BUN, CREATININE, CALCIUM,  in the last 72 hours  Studies/Results: No results found.  Assessment/Plan:    LOS: 4 days  Cotinue to ambulate in LSO with PT. Dr. Vertell Limber will visit this am.   [name] 07/01/2012, 9:14 AM

## 2012-07-01 NOTE — Progress Notes (Signed)
Hypoglycemic Event  CBG: 52  Treatment: 15 GM carbohydrate snack  Symptoms: Sweaty  Follow-up CBG: Time:0600 CBG Result:103  Possible Reasons for Event: Change in activity  Comments/MD notified:    [name]  Remember to initiate Hypoglycemia Order Set & complete

## 2012-07-01 NOTE — Progress Notes (Signed)
Improving.  D/C home.

## 2012-07-01 NOTE — Progress Notes (Signed)
Discharge instructions given to patient, Ready for discharge home.

## 2012-07-01 NOTE — Progress Notes (Signed)
Physical Therapy Treatment Patient Details Name: [name] MRN: [id number] DOB: Sep 22, 1933 Today's Date: 07/01/2012 Time: [id number] PT Time Calculation (min): 29 min  PT Assessment / Plan / Recommendation Comments on Treatment Session   Pt continues to progress with therapies; easily distracted (both internally--itching from rash/blisters--and externally) which causes her to not attend to her back precautions and safe use of RW. Recommend continued therapies (especially in her home environment) to continue to address.    Follow Up Recommendations  Home health PT;Supervision for mobility/OOB     Does the patient have the potential to tolerate intense rehabilitation     Barriers to Discharge        Equipment Recommendations  None recommended by PT    Recommendations for Other Services    Frequency Min 5X/week   Plan Discharge plan remains appropriate;Frequency remains appropriate    Precautions / Restrictions Precautions Precautions: Back Precaution Booklet Issued: Yes (comment) Precaution Comments: Able to recall 2/3 precautions (not arching) Requires min verbal cues to adhere to precautions during ADL (tends to flex) Required Braces or Orthoses: Spinal Brace Spinal Brace: Lumbar corset;Applied in sitting position Restrictions Weight Bearing Restrictions: No   Pertinent Vitals/Pain Back pain 4/10; repositioned    Mobility  Bed Mobility Bed Mobility: Sit to Sidelying Right;Rolling Left Rolling Left: 5: Supervision Sit to Sidelying Right: 4: Min assist Details for Bed Mobility Assistance: Pt able to verbalize correct technique; required assist to raise legs onto bed and vc to keep knees bent while rolling Transfers Transfers: Sit to Stand;Stand to Sit Sit to Stand: 5: Supervision;With upper extremity assist;With armrests;From chair/3-in-1;From toilet Stand to Sit: 5: Supervision;With upper extremity assist;With armrests;To bed;To toilet Details for Transfer  Assistance: cues for hand placement and walker safety Ambulation/Gait Ambulation/Gait Assistance: 4: Min guard Ambulation Distance (Feet): 150 Feet Assistive device: Rolling walker Ambulation/Gait Assistance Details: constant cues to keep RW closer to her body and to extend at her hips Gait Pattern: Step-through pattern;Decreased stride length;Trunk flexed (decreased floor clearance) Gait velocity: decreased Stairs: No (I didn't have any problem with that) Wheelchair Mobility Wheelchair Mobility: No    Exercises     PT Diagnosis:    PT Problem List:   PT Treatment Interventions:     PT Goals Acute Rehab PT Goals Time For Goal Achievement: 07/02/12 Potential to Achieve Goals: Good Pt will go Supine/Side to Sit: with supervision Pt will go Sit to Supine/Side: with supervision PT Goal: Sit to Supine/Side - Progress: Progressing toward goal Pt will go Sit to Stand: with supervision PT Goal: Sit to Stand - Progress: Met Pt will Ambulate: 51 - 150 feet;with supervision PT Goal: Ambulate - Progress: Progressing toward goal Pt will Go Up / Down Stairs: 1-2 stairs;with rail(s) Additional Goals Additional Goal #1: Pt able to verbalize and adhere to back precautions while mobilizing PT Goal: Additional Goal #1 - Progress: Progressing toward goal  Visit Information  Last PT Received On: 07/01/12 Assistance Needed: +1    Subjective Data  Subjective: The pain from the blisters is worse than my back pain   Cognition  Cognition Arousal/Alertness: Awake/alert Behavior During Therapy: WFL for tasks assessed/performed Overall Cognitive Status: Within Functional Limits for tasks assessed    Balance     End of Session PT - End of Session Equipment Utilized During Treatment: Gait belt;Back brace Activity Tolerance: Patient tolerated treatment well Patient left: with call bell/phone within reach;in bed   GP     [name] 07/01/2012, 9:16 AM  07/01/2012 Barry Brunner,  PT Pager:  [phone]

## 2012-07-02 MED FILL — Sodium Chloride IV Soln 0.9%: INTRAVENOUS | Qty: 1000 | Status: AC

## 2012-07-02 MED FILL — Heparin Sodium (Porcine) Inj 1000 Unit/ML: INTRAMUSCULAR | Qty: 30 | Status: AC

## 2012-07-17 ENCOUNTER — Encounter: Payer: Self-pay | Admitting: Neurosurgery

## 2012-07-17 ENCOUNTER — Encounter (INDEPENDENT_AMBULATORY_CARE_PROVIDER_SITE_OTHER): Payer: Medicare Other | Admitting: Vascular Surgery

## 2012-07-17 DIAGNOSIS — M79609 Pain in unspecified limb: Secondary | ICD-10-CM

## 2012-07-17 NOTE — Progress Notes (Unsigned)
Preliminary report given via telephone to hayley at [facility] neurosurgery and spine @ 15:04pm

## 2012-07-23 ENCOUNTER — Ambulatory Visit (INDEPENDENT_AMBULATORY_CARE_PROVIDER_SITE_OTHER): Payer: Medicare Other | Admitting: General Surgery

## 2012-07-24 ENCOUNTER — Inpatient Hospital Stay (HOSPITAL_COMMUNITY): Payer: Medicare Other

## 2012-07-24 ENCOUNTER — Inpatient Hospital Stay (HOSPITAL_COMMUNITY)
Admission: AD | Admit: 2012-07-24 | Discharge: 2012-07-30 | DRG: 552 | Disposition: A | Discharge status: Home / Self Care | Payer: Medicare Other | Source: Ambulatory Visit | Attending: Neurosurgery | Admitting: Neurosurgery

## 2012-07-24 ENCOUNTER — Encounter (HOSPITAL_COMMUNITY): Payer: Self-pay

## 2012-07-24 DIAGNOSIS — R531 Weakness: Secondary | ICD-10-CM | POA: Diagnosis present

## 2012-07-24 DIAGNOSIS — E785 Hyperlipidemia, unspecified: Secondary | ICD-10-CM | POA: Diagnosis present

## 2012-07-24 DIAGNOSIS — E876 Hypokalemia: Secondary | ICD-10-CM | POA: Diagnosis not present

## 2012-07-24 DIAGNOSIS — E1165 Type 2 diabetes mellitus with hyperglycemia: Secondary | ICD-10-CM | POA: Diagnosis present

## 2012-07-24 DIAGNOSIS — Z79899 Other long term (current) drug therapy: Secondary | ICD-10-CM

## 2012-07-24 DIAGNOSIS — M545 Low back pain, unspecified: Principal | ICD-10-CM | POA: Diagnosis present

## 2012-07-24 DIAGNOSIS — Z981 Arthrodesis status: Secondary | ICD-10-CM

## 2012-07-24 DIAGNOSIS — C50919 Malignant neoplasm of unspecified site of unspecified female breast: Secondary | ICD-10-CM | POA: Diagnosis present

## 2012-07-24 DIAGNOSIS — Z853 Personal history of malignant neoplasm of breast: Secondary | ICD-10-CM

## 2012-07-24 DIAGNOSIS — I10 Essential (primary) hypertension: Secondary | ICD-10-CM | POA: Diagnosis present

## 2012-07-24 DIAGNOSIS — Z9851 Tubal ligation status: Secondary | ICD-10-CM

## 2012-07-24 DIAGNOSIS — Z888 Allergy status to other drugs, medicaments and biological substances status: Secondary | ICD-10-CM

## 2012-07-24 DIAGNOSIS — Z91038 Other insect allergy status: Secondary | ICD-10-CM

## 2012-07-24 DIAGNOSIS — Z794 Long term (current) use of insulin: Secondary | ICD-10-CM

## 2012-07-24 DIAGNOSIS — E860 Dehydration: Secondary | ICD-10-CM | POA: Diagnosis present

## 2012-07-24 DIAGNOSIS — IMO0002 Reserved for concepts with insufficient information to code with codable children: Secondary | ICD-10-CM | POA: Diagnosis present

## 2012-07-24 DIAGNOSIS — Z8249 Family history of ischemic heart disease and other diseases of the circulatory system: Secondary | ICD-10-CM

## 2012-07-24 DIAGNOSIS — N179 Acute kidney failure, unspecified: Secondary | ICD-10-CM | POA: Diagnosis present

## 2012-07-24 DIAGNOSIS — M1990 Unspecified osteoarthritis, unspecified site: Secondary | ICD-10-CM

## 2012-07-24 DIAGNOSIS — K219 Gastro-esophageal reflux disease without esophagitis: Secondary | ICD-10-CM | POA: Diagnosis present

## 2012-07-24 DIAGNOSIS — Z7982 Long term (current) use of aspirin: Secondary | ICD-10-CM

## 2012-07-24 DIAGNOSIS — Z9181 History of falling: Secondary | ICD-10-CM

## 2012-07-24 DIAGNOSIS — Z9089 Acquired absence of other organs: Secondary | ICD-10-CM

## 2012-07-24 DIAGNOSIS — IMO0001 Reserved for inherently not codable concepts without codable children: Secondary | ICD-10-CM | POA: Diagnosis present

## 2012-07-24 LAB — BASIC METABOLIC PANEL
Calcium: 9 mg/dL (ref 8.4–10.5)
Creatinine, Ser: 1.81 mg/dL — ABNORMAL HIGH (ref 0.50–1.10)
GFR calc Af Amer: 30 mL/min — ABNORMAL LOW (ref >90)
GFR calc non Af Amer: 26 mL/min — ABNORMAL LOW (ref >90)
Sodium: 133 mEq/L — ABNORMAL LOW (ref 135–145)

## 2012-07-24 LAB — CBC WITH DIFFERENTIAL/PLATELET
Basophils Absolute: 0.1 10*3/uL (ref 0.0–0.1)
Lymphocytes Relative: 24 % (ref 12–46)
Lymphs Abs: 2.9 10*3/uL (ref 0.7–4.0)
Neutrophils Relative %: 62 % (ref 43–77)
Platelets: 259 10*3/uL (ref 150–400)
RBC: 3.67 MIL/uL — ABNORMAL LOW (ref 3.87–5.11)
RDW: 14.1 % (ref 11.5–15.5)
WBC: 12.1 10*3/uL — ABNORMAL HIGH (ref 4.0–10.5)

## 2012-07-24 LAB — GLUCOSE, CAPILLARY: Glucose-Capillary: 371 mg/dL — ABNORMAL HIGH (ref 70–99)

## 2012-07-24 LAB — MRSA PCR SCREENING: MRSA by PCR: POSITIVE — AB

## 2012-07-24 MED ORDER — ATORVASTATIN CALCIUM 10 MG PO TABS
10.0000 mg | ORAL_TABLET | Freq: Every day | ORAL | Status: DC
  Administered 2012-07-24 – 2012-07-29 (×6): 10 mg via ORAL
  Filled 2012-07-24 (×8): qty 1

## 2012-07-24 MED ORDER — ACETAMINOPHEN 325 MG PO TABS
650.0000 mg | ORAL_TABLET | Freq: Four times a day (QID) | ORAL | Status: DC | PRN
  Filled 2012-07-24: qty 2

## 2012-07-24 MED ORDER — GLYBURIDE-METFORMIN 2.5-500 MG PO TABS: 2.0000 | ORAL_TABLET | Freq: Two times a day (BID) | ORAL | Status: DC

## 2012-07-24 MED ORDER — BISACODYL 5 MG PO TBEC: 5.0000 mg | DELAYED_RELEASE_TABLET | Freq: Every day | ORAL | Status: DC | PRN

## 2012-07-24 MED ORDER — METOPROLOL SUCCINATE ER 100 MG PO TB24
100.0000 mg | ORAL_TABLET | Freq: Every day | ORAL | Status: DC
  Administered 2012-07-24 – 2012-07-30 (×7): 100 mg via ORAL
  Filled 2012-07-24 (×8): qty 1

## 2012-07-24 MED ORDER — OXYCODONE HCL 10 MG PO TB12: 10.0000 mg | ORAL_TABLET | ORAL | Status: DC

## 2012-07-24 MED ORDER — GABAPENTIN 300 MG PO CAPS
300.0000 mg | ORAL_CAPSULE | Freq: Three times a day (TID) | ORAL | Status: DC
  Administered 2012-07-24 – 2012-07-30 (×17): 300 mg via ORAL
  Filled 2012-07-24 (×20): qty 1

## 2012-07-24 MED ORDER — OXYCODONE HCL 5 MG PO TABS
10.0000 mg | ORAL_TABLET | ORAL | Status: DC | PRN
  Administered 2012-07-28 – 2012-07-30 (×2): 10 mg via ORAL
  Filled 2012-07-24 (×2): qty 2

## 2012-07-24 MED ORDER — PANTOPRAZOLE SODIUM 40 MG PO TBEC
40.0000 mg | DELAYED_RELEASE_TABLET | Freq: Every day | ORAL | Status: DC
  Administered 2012-07-24 – 2012-07-30 (×7): 40 mg via ORAL
  Filled 2012-07-24 (×9): qty 1

## 2012-07-24 MED ORDER — LINAGLIPTIN 5 MG PO TABS
5.0000 mg | ORAL_TABLET | Freq: Every day | ORAL | Status: DC
  Administered 2012-07-24 – 2012-07-30 (×7): 5 mg via ORAL
  Filled 2012-07-24 (×8): qty 1

## 2012-07-24 MED ORDER — AMLODIPINE BESYLATE 10 MG PO TABS
10.0000 mg | ORAL_TABLET | Freq: Every morning | ORAL | Status: DC
  Administered 2012-07-25 – 2012-07-30 (×6): 10 mg via ORAL
  Filled 2012-07-24 (×6): qty 1

## 2012-07-24 MED ORDER — KCL IN DEXTROSE-NACL 20-5-0.45 MEQ/L-%-% IV SOLN
INTRAVENOUS | Status: DC
  Administered 2012-07-24: 18:00:00 via INTRAVENOUS
  Filled 2012-07-24 (×4): qty 1000

## 2012-07-24 MED ORDER — ENOXAPARIN SODIUM 40 MG/0.4ML ~~LOC~~ SOLN
40.0000 mg | SUBCUTANEOUS | Status: DC
  Administered 2012-07-24 – 2012-07-29 (×6): 40 mg via SUBCUTANEOUS
  Filled 2012-07-24 (×7): qty 0.4

## 2012-07-24 MED ORDER — FLEET ENEMA 7-19 GM/118ML RE ENEM: 1.0000 | ENEMA | Freq: Once | RECTAL | Status: AC | PRN

## 2012-07-24 MED ORDER — ACETAMINOPHEN 650 MG RE SUPP: 650.0000 mg | Freq: Four times a day (QID) | RECTAL | Status: DC | PRN

## 2012-07-24 MED ORDER — EPINEPHRINE 0.3 MG/0.3ML IJ DEVI: 0.3000 mg | Freq: Once | INTRAMUSCULAR | Status: DC | PRN

## 2012-07-24 MED ORDER — GLYBURIDE 5 MG PO TABS
5.0000 mg | ORAL_TABLET | Freq: Two times a day (BID) | ORAL | Status: DC
  Administered 2012-07-25 – 2012-07-30 (×11): 5 mg via ORAL
  Filled 2012-07-24 (×13): qty 1

## 2012-07-24 MED ORDER — OXYCODONE HCL 5 MG PO TABS
10.0000 mg | ORAL_TABLET | ORAL | Status: DC
  Administered 2012-07-24: 10 mg via ORAL
  Filled 2012-07-24: qty 2

## 2012-07-24 MED ORDER — HYDROCODONE-ACETAMINOPHEN 5-325 MG PO TABS
1.0000 | ORAL_TABLET | ORAL | Status: DC | PRN
  Administered 2012-07-25 – 2012-07-27 (×10): 2 via ORAL
  Administered 2012-07-27: 1 via ORAL
  Administered 2012-07-27 – 2012-07-30 (×14): 2 via ORAL
  Filled 2012-07-24 (×25): qty 2

## 2012-07-24 MED ORDER — HYDRALAZINE HCL 25 MG PO TABS
25.0000 mg | ORAL_TABLET | Freq: Three times a day (TID) | ORAL | Status: DC
  Administered 2012-07-24 – 2012-07-30 (×17): 25 mg via ORAL
  Filled 2012-07-24 (×20): qty 1

## 2012-07-24 MED ORDER — EPINEPHRINE 0.3 MG/0.3ML IJ SOAJ: 0.3000 mg | Freq: Once | INTRAMUSCULAR | Status: AC | PRN

## 2012-07-24 MED ORDER — ASPIRIN EC 81 MG PO TBEC
81.0000 mg | DELAYED_RELEASE_TABLET | Freq: Every day | ORAL | Status: DC
  Administered 2012-07-24 – 2012-07-30 (×7): 81 mg via ORAL
  Filled 2012-07-24 (×8): qty 1

## 2012-07-24 MED ORDER — BENAZEPRIL HCL 20 MG PO TABS
20.0000 mg | ORAL_TABLET | Freq: Every morning | ORAL | Status: DC
  Administered 2012-07-25: 20 mg via ORAL
  Filled 2012-07-24: qty 1

## 2012-07-24 MED ORDER — INSULIN ASPART 100 UNIT/ML ~~LOC~~ SOLN
0.0000 [IU] | Freq: Three times a day (TID) | SUBCUTANEOUS | Status: DC
  Administered 2012-07-24: 15 [IU] via SUBCUTANEOUS
  Administered 2012-07-25: 3 [IU] via SUBCUTANEOUS
  Administered 2012-07-25: 11 [IU] via SUBCUTANEOUS
  Administered 2012-07-25: 5 [IU] via SUBCUTANEOUS

## 2012-07-24 MED ORDER — DOCUSATE SODIUM 100 MG PO CAPS
100.0000 mg | ORAL_CAPSULE | Freq: Two times a day (BID) | ORAL | Status: DC
  Administered 2012-07-24 – 2012-07-30 (×12): 100 mg via ORAL
  Filled 2012-07-24 (×11): qty 1

## 2012-07-24 MED ORDER — SENNOSIDES-DOCUSATE SODIUM 8.6-50 MG PO TABS: 1.0000 | ORAL_TABLET | Freq: Every evening | ORAL | Status: DC | PRN

## 2012-07-24 MED ORDER — DOCUSATE SODIUM 100 MG PO CAPS: 100.0000 mg | ORAL_CAPSULE | Freq: Every day | ORAL | Status: DC

## 2012-07-24 MED ORDER — ANASTROZOLE 1 MG PO TABS
1.0000 mg | ORAL_TABLET | Freq: Every day | ORAL | Status: DC
  Administered 2012-07-25 – 2012-07-30 (×6): 1 mg via ORAL
  Filled 2012-07-24 (×7): qty 1

## 2012-07-24 MED ORDER — DULOXETINE HCL 60 MG PO CPEP: 60.0000 mg | ORAL_CAPSULE | Freq: Every day | ORAL | Status: DC

## 2012-07-24 MED ORDER — INSULIN GLARGINE 100 UNIT/ML ~~LOC~~ SOLN
50.0000 [IU] | Freq: Every morning | SUBCUTANEOUS | Status: DC
  Administered 2012-07-25: 50 [IU] via SUBCUTANEOUS
  Filled 2012-07-24 (×2): qty 0.5

## 2012-07-24 MED ORDER — ALUM & MAG HYDROXIDE-SIMETH 200-200-20 MG/5ML PO SUSP: 30.0000 mL | Freq: Four times a day (QID) | ORAL | Status: DC | PRN

## 2012-07-24 NOTE — Progress Notes (Signed)
Patient is a direct admit from Dr. Melven Sartorius office, admitted for increased weakness and pain around incision site and multiple falls. Pt oriented to room and bed alarm. Will continue to monitor.

## 2012-07-24 NOTE — Progress Notes (Signed)
Lab notified nurse that PCR came back positive. Patient placed on contact precautions.

## 2012-07-24 NOTE — Progress Notes (Signed)
Daughter concerned about patients pain medication frequency, worried this may be part of the reason the patient continues to fall. On call MD notified, medication made PRN. Will have Dr. Vertell Limber clarify order in the morning. Pain is well managed patient stating pain is a "2" on a scale 0-10 at this time. Will continue to monitor.

## 2012-07-25 ENCOUNTER — Inpatient Hospital Stay (HOSPITAL_COMMUNITY): Payer: Medicare Other

## 2012-07-25 DIAGNOSIS — K219 Gastro-esophageal reflux disease without esophagitis: Secondary | ICD-10-CM

## 2012-07-25 DIAGNOSIS — R5383 Other fatigue: Secondary | ICD-10-CM

## 2012-07-25 DIAGNOSIS — I10 Essential (primary) hypertension: Secondary | ICD-10-CM | POA: Diagnosis present

## 2012-07-25 DIAGNOSIS — E1165 Type 2 diabetes mellitus with hyperglycemia: Secondary | ICD-10-CM | POA: Diagnosis present

## 2012-07-25 DIAGNOSIS — R531 Weakness: Secondary | ICD-10-CM | POA: Diagnosis present

## 2012-07-25 DIAGNOSIS — E785 Hyperlipidemia, unspecified: Secondary | ICD-10-CM

## 2012-07-25 DIAGNOSIS — E1140 Type 2 diabetes mellitus with diabetic neuropathy, unspecified: Secondary | ICD-10-CM | POA: Insufficient documentation

## 2012-07-25 DIAGNOSIS — E860 Dehydration: Secondary | ICD-10-CM | POA: Diagnosis present

## 2012-07-25 DIAGNOSIS — IMO0001 Reserved for inherently not codable concepts without codable children: Secondary | ICD-10-CM

## 2012-07-25 DIAGNOSIS — N179 Acute kidney failure, unspecified: Secondary | ICD-10-CM | POA: Diagnosis present

## 2012-07-25 DIAGNOSIS — R5381 Other malaise: Secondary | ICD-10-CM

## 2012-07-25 LAB — GLUCOSE, CAPILLARY
Glucose-Capillary: 164 mg/dL — ABNORMAL HIGH (ref 70–99)
Glucose-Capillary: 205 mg/dL — ABNORMAL HIGH (ref 70–99)

## 2012-07-25 LAB — URINALYSIS, ROUTINE W REFLEX MICROSCOPIC
Bilirubin Urine: NEGATIVE
Hgb urine dipstick: NEGATIVE
Specific Gravity, Urine: 1.015 (ref 1.005–1.030)
pH: 5.5 (ref 5.0–8.0)

## 2012-07-25 MED ORDER — MUPIROCIN 2 % EX OINT
1.0000 "application " | TOPICAL_OINTMENT | Freq: Two times a day (BID) | CUTANEOUS | Status: AC
  Administered 2012-07-25 – 2012-07-29 (×10): 1 via NASAL
  Filled 2012-07-25: qty 22

## 2012-07-25 MED ORDER — DEXTROSE-NACL 5-0.45 % IV SOLN
INTRAVENOUS | Status: DC
  Administered 2012-07-25: 11:00:00 via INTRAVENOUS

## 2012-07-25 MED ORDER — INSULIN ASPART 100 UNIT/ML ~~LOC~~ SOLN
0.0000 [IU] | Freq: Three times a day (TID) | SUBCUTANEOUS | Status: DC
  Administered 2012-07-25: 3 [IU] via SUBCUTANEOUS
  Administered 2012-07-26: 7 [IU] via SUBCUTANEOUS
  Administered 2012-07-26: 4 [IU] via SUBCUTANEOUS
  Administered 2012-07-27: 3 [IU] via SUBCUTANEOUS
  Administered 2012-07-27: 7 [IU] via SUBCUTANEOUS
  Administered 2012-07-28: 3 [IU] via SUBCUTANEOUS
  Administered 2012-07-28: 20 [IU] via SUBCUTANEOUS
  Administered 2012-07-29: 3 [IU] via SUBCUTANEOUS
  Administered 2012-07-29: 7 [IU] via SUBCUTANEOUS
  Administered 2012-07-29: 4 [IU] via SUBCUTANEOUS
  Administered 2012-07-30: 7 [IU] via SUBCUTANEOUS

## 2012-07-25 MED ORDER — CHLORHEXIDINE GLUCONATE CLOTH 2 % EX PADS
6.0000 | MEDICATED_PAD | Freq: Every day | CUTANEOUS | Status: AC
  Administered 2012-07-25 – 2012-07-28 (×3): 6 via TOPICAL

## 2012-07-25 MED ORDER — SODIUM CHLORIDE 0.9 % IV SOLN
INTRAVENOUS | Status: DC
  Administered 2012-07-25 – 2012-07-26 (×3): via INTRAVENOUS

## 2012-07-25 MED ORDER — INSULIN ASPART 100 UNIT/ML ~~LOC~~ SOLN
3.0000 [IU] | Freq: Three times a day (TID) | SUBCUTANEOUS | Status: DC
  Administered 2012-07-25: 3 [IU] via SUBCUTANEOUS

## 2012-07-25 MED ORDER — INSULIN ASPART 100 UNIT/ML ~~LOC~~ SOLN
0.0000 [IU] | Freq: Every day | SUBCUTANEOUS | Status: DC
Start: 2012-07-25 — End: 2012-07-30
  Administered 2012-07-25 – 2012-07-28 (×2): 2 [IU] via SUBCUTANEOUS
  Administered 2012-07-29: 4 [IU] via SUBCUTANEOUS

## 2012-07-25 NOTE — Progress Notes (Signed)
Pt. With frequent urination; pt. Voided 300 cc then PVR obtained via bladder scan which showed 365cc urine. Aaron Edelman, RN for Dr.Stern made aware; new orders received. Will monitor.

## 2012-07-25 NOTE — Evaluation (Signed)
Physical Therapy Evaluation Patient Details Name: [name] MRN: [id number] DOB: 1933/10/27 Today's Date: 07/25/2012 Time: [id number] PT Time Calculation (min): 19 min  PT Assessment / Plan / Recommendation Clinical Impression  79 y/o female with recent lumbar fusion adm. by Dr. Vertell Limber because of weakness, falls and leg pain. Presents to PT with below problem list impacting function and independence. Will benefit physical therapy in the acute setting to maximize strength and safety with mobility for d/c home.     PT Assessment  Patient needs continued PT services    Follow Up Recommendations  Home health PT;Supervision for mobility/OOB    Does the patient have the potential to tolerate intense rehabilitation      Barriers to Discharge        Equipment Recommendations  None recommended by PT    Recommendations for Other Services     Frequency Min 3X/week    Precautions / Restrictions Precautions Precautions: Back Precaution Comments: Able to recall 2/3 precautions (described arching as "stretching") Requires min verbal cues to adhere to precautions during ADL (tends to flex) Required Braces or Orthoses: Spinal Brace Spinal Brace: Lumbar corset;Applied in sitting position   Pertinent Vitals/Pain Reports back pain      Mobility  Bed Mobility Bed Mobility: Rolling Right;Right Sidelying to Sit Rolling Right: 6: Modified independent (Device/Increase time) Right Sidelying to Sit: 5: Supervision;With rails;HOB flat Details for Bed Mobility Assistance: initial verbal cueing but patient performing without physical assist, slow to rise because of pain Transfers Transfers: Sit to Stand;Stand to Sit Sit to Stand: 4: Min guard;With upper extremity assist;From bed Stand to Sit: 4: Min guard;With upper extremity assist;With armrests;To chair/3-in-1 Details for Transfer Assistance: gaurding because of lower extremity weakness Ambulation/Gait Ambulation/Gait Assistance: 4: Min  guard Ambulation Distance (Feet): 15 Feet Assistive device: Rolling walker Ambulation/Gait Assistance Details: cues for tall posture and safe proximity to RW Gait Pattern: Step-through pattern;Trunk flexed General Gait Details: decreased step height and length likely due to pain and weakness Stairs Assistance: Not tested (comment)         PT Diagnosis: Difficulty walking;Acute pain;Generalized weakness  PT Problem List: Decreased strength;Decreased activity tolerance;Decreased mobility;Decreased balance;Pain PT Treatment Interventions: Gait training;Functional mobility training;Therapeutic activities;Therapeutic exercise;Balance training;Patient/family education   PT Goals Acute Rehab PT Goals PT Goal Formulation: With patient/family Time For Goal Achievement: 08/01/12 Potential to Achieve Goals: Good Pt will go Supine/Side to Sit: with modified independence PT Goal: Supine/Side to Sit - Progress: Goal set today Pt will go Sit to Supine/Side: with modified independence PT Goal: Sit to Supine/Side - Progress: Goal set today Pt will go Sit to Stand: with modified independence PT Goal: Sit to Stand - Progress: Goal set today Pt will Ambulate: >150 feet;with modified independence;with least restrictive assistive device PT Goal: Ambulate - Progress: Goal set today Pt will Go Up / Down Stairs: 1-2 stairs;with rail(s);with supervision PT Goal: Up/Down Stairs - Progress: Goal set today  Visit Information  Last PT Received On: 07/25/12 Assistance Needed: +1    Subjective Data  Subjective: I just started having this pain and weakness on Sunday.  Patient Stated Goal: stronger   Prior Functioning  Home Living Lives With: Spouse Available Help at Discharge: Family;Available 24 hours/day Type of Home: House Home Access: Stairs to enter Entrance Stairs-Rails: Right Home Layout: One level Bathroom Shower/Tub: Tub/shower unit;Curtain Bathroom Toilet: Handicapped height Bathroom  Accessibility: Yes Home Adaptive Equipment: Walker - rolling;Straight cane;Bedside commode/3-in-1;Grab bars in shower Additional Comments: daughter assists her with her shower  Prior Function Level of Independence: Independent with assistive device(s) Comments: was independently getting around with walker until Sunday of this week Communication Communication: No difficulties    Cognition  Cognition Arousal/Alertness: Awake/alert Behavior During Therapy: WFL for tasks assessed/performed Overall Cognitive Status: Within Functional Limits for tasks assessed    Extremity/Trunk Assessment Right Upper Extremity Assessment RUE ROM/Strength/Tone: Within functional levels Left Upper Extremity Assessment LUE ROM/Strength/Tone: Within functional levels Right Lower Extremity Assessment RLE ROM/Strength/Tone: Within functional levels Left Lower Extremity Assessment LLE ROM/Strength/Tone: Within functional levels   Balance    End of Session PT - End of Session Equipment Utilized During Treatment: Gait belt;Back brace Activity Tolerance: Patient tolerated treatment well Patient left: in chair;with call bell/phone within reach Nurse Communication: Mobility status  GP     [place] 07/25/2012, 2:20 PM

## 2012-07-25 NOTE — Progress Notes (Signed)
Inpatient Diabetes Program Recommendations  AACE/ADA: New Consensus Statement on Inpatient Glycemic Control (2013)  Target Ranges:  Prepandial:   less than 140 mg/dL      Peak postprandial:   less than 180 mg/dL (1-2 hours)      Critically ill patients:  140 - 180 mg/dL   Reason for Visit: Results for MARINDA, JONCAS (MRN [id number]) as of 07/25/2012 13:32  Ref. Range 07/24/2012 16:32 07/24/2012 22:52 07/25/2012 07:21 07/25/2012 11:37  Glucose-Capillary Latest Range: 70-99 mg/dL 371 (H) 317 (H) 164 (H) 220 (H)   CBG's elevated on admission.  Patient has resumed home medications.  May benefit from the addition of Novolog 5 units tid with meals (hold if patient eats less than %50).  Also please check A1C to determine pre-hospitalization glycemic control.

## 2012-07-25 NOTE — Progress Notes (Signed)
Cr significant elevation on recent labs.  CT ok.  Will get medical management help from Hospitalist service.

## 2012-07-25 NOTE — H&P (Signed)
[name] is an 79 y.o. female.   Chief Complaint: Lumbar & bilateral leg pain, recent falls  HPI: [name] is admitted to [facility] after recent falls and lumbar/bilat leg pain not responsive to narcotic pain medications and steroids. Upon visiting the office, she was unable to exit her vehicle with her family's assistance. Admission for CT assessment of lumbar fusion hardware and control of pain was initiated.  [name] is a 79 year old female four weeks post-op L2-3 fusion with revision L3-S1, with T12-L1 right transpedicular microdiscectomy.  She progressed well initially. However, she became more active at home and found her pain to increase significantly, followed quickly by falls. "My legs just gave way", she stated.  Additionally, bilateral leg swelling ensued. DVT ruled out by venous dopplers. Steroid taper was initiated after in-office x-rays revealed well placed hardware without complicating features.   She returned to the office for her scheduled follow-up, but found herself unable to exit her vehicle with her family's help. Pain had not significantly improved after the dose-pack. Admission to [facility] was initiated for Lumbar CT to evaluate fusion hardware & control her pain. Her daughter also notes at that time increasing frequency of confusion and forgetfulness.   Past Medical History  Diagnosis Date  . Diabetes mellitus   . Arthritis   . Hyperlipidemia   . Mental disorder   . Chronic kidney disease     renal stones, one episode of Lithotripsy  . GERD (gastroesophageal reflux disease)   . Skin cancer     face- melanoma, 2012- surg. excision    . Breast cancer, left     er/pr +  . Hypertension     followed by [facility] grp. relative to  urology study grp.  Doesn't report ever having a stress test   . Complication of anesthesia   . PONV (postoperative nausea and vomiting)   . Cataract     no surgery as yet,starting of 3 years  . Seasonal allergies     Past  Surgical History  Procedure Laterality Date  . Cholecystectomy    . Abdominal hysterectomy    . Skin biopsy    . Shoulder surgery    . Tubal ligation    . Back surgery      x4 back surgery to include rods & screws    . Fracture surgery      1975- ORIF- L ankle   . Breast lumpectomy with needle localization and axillary sentinel lymph node bx  03/12/2012    Procedure: BREAST LUMPECTOMY WITH NEEDLE LOCALIZATION AND AXILLARY SENTINEL LYMPH NODE BX;  Surgeon: Merrie Roof, MD;  Location: [place];  Service: General;  Laterality: Left;  . Re-excision of breast lumpectomy  03/22/2012    Procedure: RE-EXCISION OF BREAST LUMPECTOMY;  Surgeon: Merrie Roof, MD;  Location: [place];  Service: General;  Laterality: Left;  re-excision left breast lumpectomy cavity  ER+, PR+  . Rotator cuff repair Bilateral   . Breast surgery      lumpectomy x2    Family History  Problem Relation Age of Onset  . Heart disease Father    Social History:  reports that she has never smoked. She does not have any smokeless tobacco history on file. She reports that she does not drink alcohol or use illicit drugs.  Allergies:  Allergies  Allergen Reactions  . Demerol Nausea Only  . Other Other (See Comments)    Bee stings-swelling  Medications Prior to Admission  Medication Sig Dispense Refill  . amLODipine (NORVASC) 10 MG tablet Take 10 mg by mouth every morning.       [name] Kitchen anastrozole (ARIMIDEX) 1 MG tablet Take 1 mg by mouth daily with breakfast.      . aspirin EC 81 MG tablet Take 81 mg by mouth daily.       [name] Kitchen atorvastatin (LIPITOR) 10 MG tablet Take 10 mg by mouth at bedtime.      . benazepril (LOTENSIN) 20 MG tablet Take 20 mg by mouth every morning.       [name] Kitchen CALCIUM-VITAMIN D PO Take 1 tablet by mouth daily. 1200 mg/500 mg      . docusate sodium (COLACE) 100 MG capsule Take 100 mg by mouth 2 (two) times daily as needed for constipation.       . DULoxetine (CYMBALTA) 60 MG capsule Take 60  mg by mouth at bedtime.       [name] Kitchen EPINEPHrine (EPIPEN 2-PAK) 0.3 mg/0.3 mL DEVI Inject 0.3 mg into the muscle once as needed. For anaphylaxis      . gabapentin (NEURONTIN) 300 MG capsule Take 300 mg by mouth 3 (three) times daily.      [name] Kitchen glyBURIDE-metformin (GLUCOVANCE) 2.5-500 MG per tablet Take 2 tablets by mouth 2 (two) times daily with a meal.       . hydrALAZINE (APRESOLINE) 25 MG tablet Take 25 mg by mouth 3 (three) times daily.       . insulin glargine (LANTUS SOLOSTAR) 100 UNIT/ML injection Inject 50 Units into the skin every morning.       . metoprolol succinate (TOPROL-XL) 100 MG 24 hr tablet Take 100 mg by mouth daily. Take with or immediately following a meal.      . Naphazoline-Pheniramine (OPCON-A OP) Place 1 drop into both eyes daily as needed. For dry eyes      . omeprazole (PRILOSEC) 20 MG capsule Take 20 mg by mouth at bedtime.       [name] Kitchen oxyCODONE (OXY IR/ROXICODONE) 5 MG immediate release tablet Take 10 mg by mouth every 3 (three) hours.      . sitaGLIPtin (JANUVIA) 100 MG tablet Take 100 mg by mouth every morning.         Results for orders placed during the hospital encounter of 07/24/12 (from the past 48 hour(s))  BASIC METABOLIC PANEL     Status: Abnormal   Collection Time    07/24/12  2:30 PM      Result Value Range   Sodium 133 (*) 135 - 145 mEq/L   Potassium 4.8  3.5 - 5.1 mEq/L   Chloride 95 (*) 96 - 112 mEq/L   CO2 24  19 - 32 mEq/L   Glucose, Bld 356 (*) 70 - 99 mg/dL   BUN 52 (*) 6 - 23 mg/dL   Creatinine, Ser 1.81 (*) 0.50 - 1.10 mg/dL   Calcium 9.0  8.4 - 10.5 mg/dL   GFR calc non Af Amer 26 (*) >90 mL/min   GFR calc Af Amer 30 (*) >90 mL/min   Comment:            The eGFR has been calculated     using the CKD EPI equation.     This calculation has not been     validated in all clinical     situations.     eGFR's persistently     <90 mL/min signify     possible Chronic Kidney Disease.  CBC WITH DIFFERENTIAL     Status: Abnormal   Collection Time     07/24/12  2:30 PM      Result Value Range   WBC 12.1 (*) 4.0 - 10.5 K/uL   RBC 3.67 (*) 3.87 - 5.11 MIL/uL   Hemoglobin 9.9 (*) 12.0 - 15.0 g/dL   HCT 31.6 (*) 36.0 - 46.0 %   MCV 86.1  78.0 - 100.0 fL   MCH 27.0  26.0 - 34.0 pg   MCHC 31.3  30.0 - 36.0 g/dL   RDW 14.1  11.5 - 15.5 %   Platelets 259  150 - 400 K/uL   Neutrophils Relative % 62  43 - 77 %   Neutro Abs 7.5  1.7 - 7.7 K/uL   Lymphocytes Relative 24  12 - 46 %   Lymphs Abs 2.9  0.7 - 4.0 K/uL   Monocytes Relative 9  3 - 12 %   Monocytes Absolute 1.0  0.1 - 1.0 K/uL   Eosinophils Relative 5  0 - 5 %   Eosinophils Absolute 0.6  0.0 - 0.7 K/uL   Basophils Relative 1  0 - 1 %   Basophils Absolute 0.1  0.0 - 0.1 K/uL  MRSA PCR SCREENING     Status: Abnormal   Collection Time    07/24/12  4:11 PM      Result Value Range   MRSA by PCR POSITIVE (*) NEGATIVE   Comment:            The GeneXpert MRSA Assay (FDA     approved for NASAL specimens     only), is one component of a     comprehensive MRSA colonization     surveillance program. It is not     intended to diagnose MRSA     infection nor to guide or     monitor treatment for     MRSA infections.     RESULT CALLED TO, READ BACK BY AND VERIFIED WITH:     RN C.MATTHAIS AT 2030 BY L.PITT 07/24/12  GLUCOSE, CAPILLARY     Status: Abnormal   Collection Time    07/24/12  4:32 PM      Result Value Range   Glucose-Capillary 371 (*) 70 - 99 mg/dL  GLUCOSE, CAPILLARY     Status: Abnormal   Collection Time    07/24/12 10:52 PM      Result Value Range   Glucose-Capillary 317 (*) 70 - 99 mg/dL   Comment 1 Notify RN    GLUCOSE, CAPILLARY     Status: Abnormal   Collection Time    07/25/12  7:21 AM      Result Value Range   Glucose-Capillary 164 (*) 70 - 99 mg/dL   Comment 1 Notify RN     Ct Lumbar Spine Wo Contrast  07/25/2012   *RADIOLOGY REPORT*  Clinical Data: Evaluate hardware.  Lumbar fusion.  Increased back pain post fusion.  Right leg weakness.  CT LUMBAR SPINE  WITHOUT CONTRAST  Technique:  Multidetector CT imaging of the lumbar spine was performed without intravenous contrast administration. Multiplanar CT image reconstructions were also generated.  Comparison: MRI 05/27/2012  Findings: Bilateral pedicle screw and posterior rod fusion L2-S1. Pedicle screws are in good position.  Discontinuity of the posterior rod on the right between L3 and L4.  Discontinuity of the left rod between L4 and L5.  Negative for acute fracture.  No mass lesion.  T11-T12:  Disc degeneration and  spondylosis without spinal stenosis  T12-L1:  Mild disc degeneration.  L1-2:  5 mm posterior slip.  Severe disc degeneration with disc space narrowing, endplate irregularity, and extensive spondylosis. There is advanced facet hypertrophy.  There is moderate spinal stenosis and foraminal stenosis bilaterally.  Advanced degenerative changes are similar to prior studies.  L2-3:  Satisfactory fusion posteriorly and through the disc space. No significant stenosis  L3-4:  Pedicle screw and interbody fusion.  Fusion appears solid. Posterior decompression without stenosis.  L4-5:  Pedicle screw and interbody fusion, probably solid. 3 mm anterior slip, unchanged.  Posterior decompression without significant stenosis  L5-S1:  Pedicle screw and interbody fusion.  Posterior spondylosis causing foraminal encroachment bilaterally left greater than right.  IMPRESSION: Pedicle screw fusion and interbody fusion L2-S1.  Hardware appears to be in satisfactory position.  No acute fracture.  Advanced disc and facet degeneration L1-2 causing spinal stenosis and foraminal encroachment, similar to prior studies.   Original Report Authenticated By: Carl Best, M.D.    Review of Systems  HENT: Negative.   Eyes: Negative.   Respiratory: Negative.   Cardiovascular: Negative.   Gastrointestinal: Negative.   Genitourinary: Positive for frequency.  Musculoskeletal: Positive for back pain and falls.  Skin: Negative.    Neurological: Positive for weakness.  Endo/Heme/Allergies: Negative.     Blood pressure 152/61, pulse 80, temperature 98.6 F (37 C), temperature source Oral, resp. rate 18, height 5\' 2"  (1.575 m), weight 77.066 kg (169 lb 14.4 oz), SpO2 99.00%. Physical Exam   Assessment/Plan Pain and reported weakness worrisome for changes in fusion. Admission for lumbar CT & pain control. Continue home meds.   Verdis Prime 07/25/2012, 9:39 AM  (LATE ENTRY...for 07/24/12 17:00)   CT OK per my review.  Labs indicate renal failure.  My ssuspicion of infection is low.

## 2012-07-25 NOTE — Clinical Documentation Improvement (Signed)
RENAL FAILURE DOCUMENTATION CLARIFICATION QUERY  THIS DOCUMENT IS NOT A PERMANENT PART OF THE MEDICAL RECORD  Please update your documentation within the medical record to reflect your response to this query.                                                                                    07/25/12  Dear Dr.Stern,  In a better effort to capture your patient's severity of illness, reflect appropriate length of stay and utilization of resources, a review of the patient medical record has revealed the following indicators.   Based on your clinical judgment, please clarify and document in a progress note and/or discharge summary the clinical condition associated with the following supporting information: In responding to this query please exercise your independent judgment.  The fact that a query is asked, does not imply that any particular answer is desired or expected.   Hello Dr. Vertell Limber!  [name] was admitted with Lumbar & bilateral leg pain, recent falls after recent surgical treatment). Per H&P: "Labs indicate renal failure". If possible, please help clarify by adding the acuity to this diagnosis in the H&P/progress note(s). Thank you!  Possible Clinical Conditions?  - Acute Renal Failure  - Other condition (please document in the progress notes and/or discharge summary)  - Cannot Clinically determine at this time   Supporting Information:  Component      BUN Creatinine  Latest Ref Rng      6 - 23 mg/dL 0.50 - 1.10 mg/dL  06/27/2012     6:41 AM 19 0.71  06/27/2012     7:34 AM    06/27/2012     9:43 AM    06/27/2012     10:57 AM    07/24/2012      52 (H) 1.81 (H)      additional documentation in chart upon review. SM   Thank You,  Hartley Barefoot  Clinical Documentation Specialist: Office [phone]  [employer]

## 2012-07-25 NOTE — Progress Notes (Signed)
Subjective: Patient reports "My back & thighs hurt some when I try to move"  Objective: Vital signs in last 24 hours: Temp:  [97.6 F (36.4 C)-99.9 F (37.7 C)] 98.6 F (37 C) (05/29 0718) Pulse Rate:  [80-115] 80 (05/29 0718) Resp:  [18] 18 (05/29 0718) BP: (113-154)/(54-63) 152/61 mmHg (05/29 0718) SpO2:  [92 %-99 %] 99 % (05/29 0718) Weight:  [77.066 kg (169 lb 14.4 oz)] 77.066 kg (169 lb 14.4 oz) (05/28 1432)  Intake/Output from previous day: 05/28 0701 - 05/29 0700 In: 400 [P.O.:400] Out: -  Intake/Output this shift:    Alert, eating breakfast. Daughter at bedside. Incision healing well, without erythema, swelling, or drainage. Pt reports of pain remains bilat thighs and low back.  CT reviewed by DrStern: no loosening/migration of screws. Labs indicate renal failure. WBC up, after completion of a week's steroid taper. Pt also notes urinary frequency through the night. Discussion with pt & daughter reveals no renal Hx other than kidney stones in distant past.   Lab Results:  Recent Labs  07/24/12 1430  WBC 12.1*  HGB 9.9*  HCT 31.6*  PLT 259   BMET  Recent Labs  07/24/12 1430  NA 133*  K 4.8  CL 95*  CO2 24  GLUCOSE 356*  BUN 52*  CREATININE 1.81*  CALCIUM 9.0    Studies/Results: No results found.  Assessment/Plan:    LOS: 1 day  Per Dr. Vertell Limber, will remove K from IV, c/s to Hospitalists for medical mgt. Mobilize with PT as fatigue/weakness will allow. Strict I&O.    Verdis Prime 07/25/2012, 8:20 AM

## 2012-07-25 NOTE — Progress Notes (Signed)
Awaiting Hospitalist input regarding acute renal failure.

## 2012-07-25 NOTE — Consult Note (Addendum)
[place] Consultation  [name] [id number] DOB: 03/01/1933 DOA: 07/24/2012 PCP: Stacey Bogus, MD   Requesting physician: [name] Date of consultation: 07/25/12 Reason for consultation: acute renal failure  Late entry. Patient seen at 3 pm  Impression/Recommendations    Acute renal failure:  secondary to prerenal azotemia in the setting of ACE inhibitor and poor oral intake. Recommend holding the ACE inhibitor, changing IV fluids to saline and increasing rate. Should correct with hydration. Repeat BMET tomorrow. Also there is some question of possible urinary retention, though this is not confirmed. Urinalysis pending. Will need to rule out urinary tract infection. Patient reports suprapubic pain and some vague dysuria. If UA negative would recommend chest x-ray to rule out occult pneumonia.     Dehydration: See above. Patient reports that she's not been eating much recently. clinically appears dry and dehydration consistent with her increased BUN and creatinine.    DM (diabetes mellitus), type 2, uncontrolled: Will check hemoglobin A1c. Increase sliding scale coverage. Remove dextrose from IV fluid. No metformin while in renal failure. Continue Lantus and Januvia.    Essential hypertension, benign: Continue Norvasc. Hold benazepril. Continue hydralazine. Continue Toprol XL.    Generalized weakness: Likely in part related to dehydration. Rule out infection. Patient has mild leukocytosis    History of Breast cancer    Hyperlipidemia on statin    GERD (gastroesophageal reflux disease)   [facility] hospitalist will continue to follow. Thank you, Dr. Vertell [name], for this consultation.  Chief Complaint: Weakness  HPI:  Ms. [name] is a 79 year old white female patient of Dr. Luan [name] who was admitted by Dr. Vertell [name] with weakness. She had recent spinal surgery and was admitted to workup or weakness to rule out infection. Family reports that she has not been eating or  drinking well recently. Family reports that she seemed a bit confused yesterday. Today she is sleepy. She has been weak. Patient reports that her legs seem weaker than the rest of her body. She has fallen several times but sustained no injury. She has had no fevers or chills. She reports some suprapubic pain. Mild dysuria. No frequency. Foley catheter has been placed. According to her sister, due to urinary retention, but this is unconfirmed. Patient has history of hypertension, diabetes. Laboratory testing were significant for a BUN above 50 and a creatinine of about 1.8. Baseline creatinine below 1 previously per Hospitalist work consult to assist with acute renal failure and other medical issues. Her blood sugars have been running high, in the 200s and 300s. She is currently on dextrose infusion.  Review of Systems: Systems Reviewed and as above otherwise negative.  Past Medical History  Diagnosis Date  . Diabetes mellitus   . Arthritis   . Hyperlipidemia   . Mental disorder   . Chronic kidney disease     renal stones, one episode of Lithotripsy  . GERD (gastroesophageal reflux disease)   . Skin cancer     face- melanoma, 2012- surg. excision    . Breast cancer, left     er/pr +  . Hypertension     followed by [name] grp. relative to  urology study grp.  Doesn't report ever having a stress test   . Complication of anesthesia   . PONV (postoperative nausea and vomiting)   . Cataract     no surgery as yet,starting of 3 years  . Seasonal allergies    Past Surgical History  Procedure Laterality Date  . Cholecystectomy    . Abdominal  hysterectomy    . Skin biopsy    . Shoulder surgery    . Tubal ligation    . Back surgery      x4 back surgery to include rods & screws    . Fracture surgery      1975- ORIF- L ankle   . Breast lumpectomy with needle localization and axillary sentinel lymph node bx  03/12/2012    Procedure: BREAST LUMPECTOMY WITH NEEDLE LOCALIZATION AND AXILLARY  SENTINEL LYMPH NODE BX;  Surgeon: Merrie Roof, MD;  Location: [facility];  Service: General;  Laterality: Left;  . Re-excision of breast lumpectomy  03/22/2012    Procedure: RE-EXCISION OF BREAST LUMPECTOMY;  Surgeon: Merrie Roof, MD;  Location: [facility];  Service: General;  Laterality: Left;  re-excision left breast lumpectomy cavity  ER+, PR+  . Rotator cuff repair Bilateral   . Breast surgery      lumpectomy x2   Social History:  reports that she has never smoked. She does not have any smokeless tobacco history on file. She reports that she does not drink alcohol or use illicit drugs.  Allergies  Allergen Reactions  . Demerol Nausea Only  . Other Other (See Comments)    Bee stings-swelling   Family History  Problem Relation Age of Onset  . Heart disease Father     Prior to Admission medications   Medication Sig Start Date End Date Taking? Authorizing Provider  amLODipine (NORVASC) 10 MG tablet Take 10 mg by mouth every morning.    Yes Historical Provider, MD  anastrozole (ARIMIDEX) 1 MG tablet Take 1 mg by mouth daily with breakfast.   Yes Historical Provider, MD  aspirin EC 81 MG tablet Take 81 mg by mouth daily.    Yes Historical Provider, MD  atorvastatin (LIPITOR) 10 MG tablet Take 10 mg by mouth at bedtime.   Yes Historical Provider, MD  benazepril (LOTENSIN) 20 MG tablet Take 20 mg by mouth every morning.    Yes Historical Provider, MD  CALCIUM-VITAMIN D PO Take 1 tablet by mouth daily. 1200 mg/500 mg   Yes Historical Provider, MD  docusate sodium (COLACE) 100 MG capsule Take 100 mg by mouth 2 (two) times daily as needed for constipation.    Yes Historical Provider, MD  DULoxetine (CYMBALTA) 60 MG capsule Take 60 mg by mouth at bedtime.    Yes Historical Provider, MD  EPINEPHrine (EPIPEN 2-PAK) 0.3 mg/0.3 mL DEVI Inject 0.3 mg into the muscle once as needed. For anaphylaxis   Yes Historical Provider, MD  gabapentin (NEURONTIN) 300 MG capsule Take 300 mg by  mouth 3 (three) times daily.   Yes Historical Provider, MD  glyBURIDE-metformin (GLUCOVANCE) 2.5-500 MG per tablet Take 2 tablets by mouth 2 (two) times daily with a meal.    Yes Historical Provider, MD  hydrALAZINE (APRESOLINE) 25 MG tablet Take 25 mg by mouth 3 (three) times daily.    Yes Historical Provider, MD  insulin glargine (LANTUS SOLOSTAR) 100 UNIT/ML injection Inject 50 Units into the skin every morning.    Yes Historical Provider, MD  metoprolol succinate (TOPROL-XL) 100 MG 24 hr tablet Take 100 mg by mouth daily. Take with or immediately following a meal.   Yes Historical Provider, MD  Naphazoline-Pheniramine (OPCON-A OP) Place 1 drop into both eyes daily as needed. For dry eyes   Yes Historical Provider, MD  omeprazole (PRILOSEC) 20 MG capsule Take 20 mg by mouth at bedtime.  Yes Historical Provider, MD  oxyCODONE (OXY IR/ROXICODONE) 5 MG immediate release tablet Take 10 mg by mouth every 3 (three) hours.   Yes Historical Provider, MD  sitaGLIPtin (JANUVIA) 100 MG tablet Take 100 mg by mouth every morning.    Yes Historical Provider, MD   Physical Exam: Blood pressure 152/76, pulse 79, temperature 98.7 F (37.1 C), temperature source Oral, resp. rate 18, height 5\' 2"  (1.575 m), weight 77.066 kg (169 lb 14.4 oz), SpO2 96.00%. Filed Vitals:   07/24/12 2249 07/25/12 0718 07/25/12 0953 07/25/12 1503  BP:  152/61 142/49 152/76  Pulse: 115 80 82 79  Temp: 99.9 F (37.7 C) 98.6 F (37 C) 98.7 F (37.1 C)   TempSrc: Oral Oral Oral   Resp: 18 18 20 18   Height:      Weight:      SpO2: 92% 99% 94% 96%  BP 100/68  Pulse 86  Temp(Src) 98.3 F (36.8 C) (Oral)  Resp 20  Ht 5\' 2"  (1.575 m)  Wt 77.066 kg (169 lb 14.4 oz)  BMI 31.07 kg/m2  SpO2 94%  General Appearance:    asleep. Arousable. Falls quickly back to sleep. Appropriate.   Head:    Normocephalic, without obvious abnormality, atraumatic  Eyes:    PERRL, conjunctiva/corneas clear, EOM's intact, fundi    benign, both eyes      Nose:   Nares normal, septum midline, mucosa normal, no drainage    or sinus tenderness  Throat:   dry mucous membranes   Neck:   Supple, symmetrical, trachea midline, no adenopathy;    thyroid:  no enlargement/tenderness/nodules; no carotid   bruit or JVD  Back:     incision okay  no CVA tenderness  Lungs:     Clear to auscultation bilaterally, respirations unlabored  Chest Wall:    No tenderness or deformity   Heart:    Regular rate and rhythm, S1 and S2 normal, no murmur, rub   or gallop     Abdomen:     Soft, non-tender, bowel sounds active all four quadrants,    no masses, no organomegaly  Genitalia:    Foley catheter draining amber colored urine.   Rectal:    deferred   Extremities:   Extremities normal, atraumatic, no cyanosis or edema  Pulses:   2+ and symmetric all extremities  Skin:   Skin color, texture, turgor normal, no rashes or lesions  Lymph nodes:   Cervical, supraclavicular, and axillary nodes normal  Neurologic:   CNII-XII intact, no focal weakness     psychiatric: Calm and cooperative.   Labs on Admission:  Basic Metabolic Panel:  Recent Labs Lab 07/24/12 1430  NA 133*  K 4.8  CL 95*  CO2 24  GLUCOSE 356*  BUN 52*  CREATININE 1.81*  CALCIUM 9.0   Liver Function Tests: No results found for this basename: AST, ALT, ALKPHOS, BILITOT, PROT, ALBUMIN,  in the last 168 hours No results found for this basename: LIPASE, AMYLASE,  in the last 168 hours No results found for this basename: AMMONIA,  in the last 168 hours CBC:  Recent Labs Lab 07/24/12 1430  WBC 12.1*  NEUTROABS 7.5  HGB 9.9*  HCT 31.6*  MCV 86.1  PLT 259   Cardiac Enzymes: No results found for this basename: CKTOTAL, CKMB, CKMBINDEX, TROPONINI,  in the last 168 hours BNP: No components found with this basename: POCBNP,  CBG:  Recent Labs Lab 07/24/12 1632 07/24/12 2252 07/25/12 0721 07/25/12 1137  GLUCAP 371* 317* 164* 220*    Radiological Exams on Admission: Ct  Lumbar Spine Wo Contrast  07/25/2012   *RADIOLOGY REPORT*  Clinical Data: Evaluate hardware.  Lumbar fusion.  Increased back pain post fusion.  Right leg weakness.  CT LUMBAR SPINE WITHOUT CONTRAST  Technique:  Multidetector CT imaging of the lumbar spine was performed without intravenous contrast administration. Multiplanar CT image reconstructions were also generated.  Comparison: MRI 05/27/2012  Findings: Bilateral pedicle screw and posterior rod fusion L2-S1. Pedicle screws are in good position.  Discontinuity of the posterior rod on the right between L3 and L4.  Discontinuity of the left rod between L4 and L5.  Negative for acute fracture.  No mass lesion.  T11-T12:  Disc degeneration and spondylosis without spinal stenosis  T12-L1:  Mild disc degeneration.  L1-2:  5 mm posterior slip.  Severe disc degeneration with disc space narrowing, endplate irregularity, and extensive spondylosis. There is advanced facet hypertrophy.  There is moderate spinal stenosis and foraminal stenosis bilaterally.  Advanced degenerative changes are similar to prior studies.  L2-3:  Satisfactory fusion posteriorly and through the disc space. No significant stenosis  L3-4:  Pedicle screw and interbody fusion.  Fusion appears solid. Posterior decompression without stenosis.  L4-5:  Pedicle screw and interbody fusion, probably solid. 3 mm anterior slip, unchanged.  Posterior decompression without significant stenosis  L5-S1:  Pedicle screw and interbody fusion.  Posterior spondylosis causing foraminal encroachment bilaterally left greater than right.  IMPRESSION: Pedicle screw fusion and interbody fusion L2-S1.  Hardware appears to be in satisfactory position.  No acute fracture.  Advanced disc and facet degeneration L1-2 causing spinal stenosis and foraminal encroachment, similar to prior studies.   Original Report Authenticated By: Carl Best, M.D.   Time spent: 60 minutes  [facility] Pager  [phone]  If 7PM-7AM, please contact night-coverage [email or web address] Password [facility] 07/25/2012, 3:26 PM

## 2012-07-25 NOTE — Progress Notes (Signed)
Daughter very concerned about her mothers confusion. Upon assessment patient remains baseline from admission on 07/24/2012,answering orientation questions appropriately. Patient does have moments of scattered thinking during conversations. Will continue to monitor.

## 2012-07-26 DIAGNOSIS — E785 Hyperlipidemia, unspecified: Secondary | ICD-10-CM

## 2012-07-26 DIAGNOSIS — M79609 Pain in unspecified limb: Secondary | ICD-10-CM

## 2012-07-26 DIAGNOSIS — K219 Gastro-esophageal reflux disease without esophagitis: Secondary | ICD-10-CM

## 2012-07-26 DIAGNOSIS — IMO0001 Reserved for inherently not codable concepts without codable children: Secondary | ICD-10-CM

## 2012-07-26 DIAGNOSIS — E1165 Type 2 diabetes mellitus with hyperglycemia: Secondary | ICD-10-CM

## 2012-07-26 DIAGNOSIS — E860 Dehydration: Secondary | ICD-10-CM

## 2012-07-26 LAB — CBC
Hemoglobin: 9.4 g/dL — ABNORMAL LOW (ref 12.0–15.0)
MCH: 26.5 pg (ref 26.0–34.0)
RBC: 3.55 MIL/uL — ABNORMAL LOW (ref 3.87–5.11)
WBC: 6.2 10*3/uL (ref 4.0–10.5)

## 2012-07-26 LAB — HEMOGLOBIN A1C
Hgb A1c MFr Bld: 8.3 % — ABNORMAL HIGH (ref <5.7)
Mean Plasma Glucose: 192 mg/dL — ABNORMAL HIGH (ref <117)

## 2012-07-26 LAB — GLUCOSE, CAPILLARY
Glucose-Capillary: 60 mg/dL — ABNORMAL LOW (ref 70–99)
Glucose-Capillary: 88 mg/dL (ref 70–99)
Glucose-Capillary: 140 mg/dL — ABNORMAL HIGH (ref 70–99)
Glucose-Capillary: 207 mg/dL — ABNORMAL HIGH (ref 70–99)
Glucose-Capillary: 200 mg/dL — ABNORMAL HIGH (ref 70–99)

## 2012-07-26 LAB — BASIC METABOLIC PANEL
CO2: 28 mEq/L (ref 19–32)
GFR calc non Af Amer: 82 mL/min — ABNORMAL LOW (ref >90)
Glucose, Bld: 154 mg/dL — ABNORMAL HIGH (ref 70–99)
Potassium: 4 mEq/L (ref 3.5–5.1)
Sodium: 139 mEq/L (ref 135–145)

## 2012-07-26 MED ORDER — INSULIN GLARGINE 100 UNIT/ML ~~LOC~~ SOLN
25.0000 [IU] | Freq: Every morning | SUBCUTANEOUS | Status: DC
  Administered 2012-07-26: 25 [IU] via SUBCUTANEOUS
  Filled 2012-07-26: qty 0.25

## 2012-07-26 MED ORDER — INSULIN GLARGINE 100 UNIT/ML ~~LOC~~ SOLN
40.0000 [IU] | Freq: Every morning | SUBCUTANEOUS | Status: DC
  Filled 2012-07-26: qty 0.4

## 2012-07-26 NOTE — Progress Notes (Signed)
VASCULAR LAB PRELIMINARY  PRELIMINARY  PRELIMINARY  PRELIMINARY  Left lower extremity venous duplex completed.    Preliminary report:  Left:  No evidence of DVT, superficial thrombosis, or Baker's cyst.  Bohdi Leeds, RVT 07/26/2012, 2:53 PM

## 2012-07-26 NOTE — Progress Notes (Addendum)
[facility] PROGRESS NOTE  [name] [id number] DOB: September 03, 1933 DOA: 07/24/2012 PCP: Alonza Bogus, MD  Assessment/Plan: Acute renal failure:  -Per staff, unable to obtain bloodwork this AM secondary to lack of IV access. Will attempt to obtain repeat renal fx. -secondary to prerenal azotemia in the setting of ACE inhibitor and poor oral intake.  -Continue holding the ACE inhibitor -Cont IVF -Urinalysis unremarkable  Dehydration: -See above. Patient reports that she's not been eating much recently. clinically appears dry and dehydration consistent with her increased BUN and creatinine.  DM (diabetes mellitus), type 2, uncontrolled: -awaiting hemoglobin A1c. -Cont sliding scale coverage. -No metformin while in renal failure. -Continue Lantus and Januvia.   Essential hypertension, benign:  -Continue Norvasc.  -Hold benazepril.  -Continue hydralazine. -Continue Toprol XL.  Generalized weakness: -Likely in part related to dehydration. Rule out infection. Patient has mild leukocytosis   History of Breast cancer   Hyperlipidemia on statin   GERD (gastroesophageal reflux disease)   Left Posterior Calf Tenderness: -Pt has been w/ SCD's -Will obtain LE dopplers to r/o DVT  [facility] hospitalist will continue to follow. Thank you, Dr. Vertell Limber, for this consultation    HPI/Subjective: The patient is awake with no complaints morning.  Objective: Filed Vitals:   07/25/12 1503 07/25/12 1735 07/25/12 2135 07/26/12 0520  BP: 152/76 100/68 141/61 161/69  Pulse: 79 86 82 74  Temp:  98.3 F (36.8 C) 97.9 F (36.6 C) 97.4 F (36.3 C)  TempSrc:  Oral Oral Oral  Resp: 18 20 20 20   Height:      Weight:      SpO2: 96% 94% 97% 91%    Intake/Output Summary (Last 24 hours) at 07/26/12 0750 Last data filed at 07/26/12 0523  Gross per 24 hour  Intake    360 ml  Output   1450 ml  Net  -1090 ml   Filed Weights   07/24/12 1432  Weight: 77.066 kg (169 lb 14.4 oz)     Exam:   General:  Patient awake and apparent distress  Cardiovascular: Regular, S1-S2  Respiratory: Normal respiratory effort, no crackles no wheezing  Abdomen: Soft, positive bowel sounds  Musculoskeletal: Distally perfused, no clubbing or cyanosis   Data Reviewed: Basic Metabolic Panel:  Recent Labs Lab 07/24/12 1430  NA 133*  K 4.8  CL 95*  CO2 24  GLUCOSE 356*  BUN 52*  CREATININE 1.81*  CALCIUM 9.0   Liver Function Tests: No results found for this basename: AST, ALT, ALKPHOS, BILITOT, PROT, ALBUMIN,  in the last 168 hours No results found for this basename: LIPASE, AMYLASE,  in the last 168 hours No results found for this basename: AMMONIA,  in the last 168 hours CBC:  Recent Labs Lab 07/24/12 1430  WBC 12.1*  NEUTROABS 7.5  HGB 9.9*  HCT 31.6*  MCV 86.1  PLT 259   Cardiac Enzymes: No results found for this basename: CKTOTAL, CKMB, CKMBINDEX, TROPONINI,  in the last 168 hours BNP (last 3 results) No results found for this basename: PROBNP,  in the last 8760 hours CBG:  Recent Labs Lab 07/25/12 0721 07/25/12 1137 07/25/12 1633 07/25/12 2139 07/26/12 0652  GLUCAP 164* 220* 184* 205* 60*    Recent Results (from the past 240 hour(s))  MRSA PCR SCREENING     Status: Abnormal   Collection Time    07/24/12  4:11 PM      Result Value Range Status   MRSA by PCR POSITIVE (*) NEGATIVE Final  Comment:            The GeneXpert MRSA Assay (FDA     approved for NASAL specimens     only), is one component of a     comprehensive MRSA colonization     surveillance program. It is not     intended to diagnose MRSA     infection nor to guide or     monitor treatment for     MRSA infections.     RESULT CALLED TO, READ BACK BY AND VERIFIED WITH:     RN C.MATTHAIS AT 2030 BY L.PITT 07/24/12     Studies: Ct Lumbar Spine Wo Contrast  07/25/2012   *RADIOLOGY REPORT*  Clinical Data: Evaluate hardware.  Lumbar fusion.  Increased back pain post fusion.   Right leg weakness.  CT LUMBAR SPINE WITHOUT CONTRAST  Technique:  Multidetector CT imaging of the lumbar spine was performed without intravenous contrast administration. Multiplanar CT image reconstructions were also generated.  Comparison: MRI 05/27/2012  Findings: Bilateral pedicle screw and posterior rod fusion L2-S1. Pedicle screws are in good position.  Discontinuity of the posterior rod on the right between L3 and L4.  Discontinuity of the left rod between L4 and L5.  Negative for acute fracture.  No mass lesion.  T11-T12:  Disc degeneration and spondylosis without spinal stenosis  T12-L1:  Mild disc degeneration.  L1-2:  5 mm posterior slip.  Severe disc degeneration with disc space narrowing, endplate irregularity, and extensive spondylosis. There is advanced facet hypertrophy.  There is moderate spinal stenosis and foraminal stenosis bilaterally.  Advanced degenerative changes are similar to prior studies.  L2-3:  Satisfactory fusion posteriorly and through the disc space. No significant stenosis  L3-4:  Pedicle screw and interbody fusion.  Fusion appears solid. Posterior decompression without stenosis.  L4-5:  Pedicle screw and interbody fusion, probably solid. 3 mm anterior slip, unchanged.  Posterior decompression without significant stenosis  L5-S1:  Pedicle screw and interbody fusion.  Posterior spondylosis causing foraminal encroachment bilaterally left greater than right.  IMPRESSION: Pedicle screw fusion and interbody fusion L2-S1.  Hardware appears to be in satisfactory position.  No acute fracture.  Advanced disc and facet degeneration L1-2 causing spinal stenosis and foraminal encroachment, similar to prior studies.   Original Report Authenticated By: Carl Best, M.D.   Dg Chest Port 1 View  07/25/2012   *RADIOLOGY REPORT*  Clinical Data: Leukocytosis  PORTABLE CHEST - 1 VIEW  Comparison: None.  Findings: Normal mediastinum and cardiac silhouette.  Normal pulmonary  vasculature.  No evidence  of effusion, infiltrate, or pneumothorax.  No acute bony abnormality.  IMPRESSION: No acute cardiopulmonary process.   Original Report Authenticated By: Suzy Bouchard, M.D.    Scheduled Meds: . amLODipine  10 mg Oral q morning - 10a  . anastrozole  1 mg Oral Q breakfast  . aspirin EC  81 mg Oral Daily  . atorvastatin  10 mg Oral QHS  . Chlorhexidine Gluconate Cloth  6 each Topical Q0600  . docusate sodium  100 mg Oral BID  . enoxaparin (LOVENOX) injection  40 mg Subcutaneous Q24H  . gabapentin  300 mg Oral TID  . glyBURIDE  5 mg Oral BID WC  . hydrALAZINE  25 mg Oral TID  . insulin aspart  0-20 Units Subcutaneous TID WC  . insulin aspart  0-5 Units Subcutaneous QHS  . insulin aspart  3 Units Subcutaneous TID WC  . insulin glargine  50 Units Subcutaneous q morning -  10a  . linagliptin  5 mg Oral Daily  . metoprolol succinate  100 mg Oral Daily  . mupirocin ointment  1 application Nasal BID  . pantoprazole  40 mg Oral Daily   Continuous Infusions: . sodium chloride 125 mL/hr at 07/25/12 1737    Active Problems:   Breast cancer   Hyperlipidemia   GERD (gastroesophageal reflux disease)   Acute renal failure   Dehydration   DM (diabetes mellitus), type 2, uncontrolled   Essential hypertension, benign   Generalized weakness    Time spent: 20 minutes    [facility], [facility] Pager [phone]. If 7PM-7AM, please contact night-coverage at [email or web address], password [facility] 07/26/2012, 7:50 AM  LOS: 2 days

## 2012-07-26 NOTE — Progress Notes (Signed)
Physical Therapy Treatment Patient Details Name: [name] MRN: [id number] DOB: [birth year] Today's Date: 07/26/2012 Time: [id number] PT Time Calculation (min): 25 min  PT Assessment / Plan / Recommendation Comments on Treatment Session  Slow progress this morning because of pain. Encouraged pt to ambulate with nursing staff and sit up for meals. Daughter very concerned about d/c too soon and thinks that is the reason she is back here now. Wants to talk to Case management about switching to [facility] for [facility] services.     Follow Up Recommendations  Home health PT;Supervision for mobility/OOB     Does the patient have the potential to tolerate intense rehabilitation     Barriers to Discharge        Equipment Recommendations  None recommended by PT    Recommendations for Other Services    Frequency Min 3X/week (+)   Plan Discharge plan remains appropriate;Frequency remains appropriate    Precautions / Restrictions Precautions Precautions: Back Precaution Comments: Able to recall 2/3 precautions, reminded on arching Required Braces or Orthoses: Spinal Brace Spinal Brace: Lumbar corset;Applied in sitting position   Pertinent Vitals/Pain Reports 8/10 pain, pain meds requested and RN in the room following our session    Mobility  Bed Mobility Rolling Right: 6: Modified independent (Device/Increase time);With rail Right Sidelying to Sit: 6: Modified independent (Device/Increase time) Details for Bed Mobility Assistance: increased time due to pain Transfers Transfers: Sit to Stand;Stand to Sit;Stand Pivot Transfers Sit to Stand: 4: Min assist;With upper extremity assist Stand to Sit: 4: Min assist;With armrests Stand Pivot Transfers: 4: Min assist Details for Transfer Assistance: min stability assist today because of increased pain, reminders for safe technique and tall posture Ambulation/Gait Ambulation/Gait Assistance: 4: Min guard Ambulation Distance (Feet): 15  Feet Assistive device: Rolling walker Ambulation/Gait Assistance Details: limited distance because of pain Gait Pattern: Step-through pattern;Trunk flexed;Decreased stride length General Gait Details: decreased step height and length likely due to pain and weakness Stairs: No      PT Goals Acute Rehab PT Goals PT Goal: Supine/Side to Sit - Progress: Met PT Goal: Sit to Stand - Progress: Progressing toward goal PT Goal: Ambulate - Progress: Progressing toward goal  Visit Information  Last PT Received On: 07/26/12 Assistance Needed: +1    Subjective Data  Subjective: I have been waiting on pain medicine this morning.  Patient Stated Goal: home when stronger (daughter is adamant she should not discharge too soon this time)   Cognition  Cognition Arousal/Alertness: Awake/alert Behavior During Therapy: WFL for tasks assessed/performed Overall Cognitive Status: Within Functional Limits for tasks assessed    Balance     End of Session PT - End of Session Equipment Utilized During Treatment: Gait belt Activity Tolerance: Patient tolerated treatment well Patient left: in chair;with call bell/phone within reach;with family/visitor present Nurse Communication: Mobility status   GP     [place] 07/26/2012, 9:39 AM

## 2012-07-26 NOTE — Progress Notes (Signed)
Subjective: Patient reports feeling better this am.  Objective: Vital signs in last 24 hours: Temp:  [97.4 F (36.3 C)-98.7 F (37.1 C)] 97.4 F (36.3 C) (05/30 0520) Pulse Rate:  [74-86] 74 (05/30 0520) Resp:  [18-20] 20 (05/30 0520) BP: (100-161)/(49-76) 161/69 mmHg (05/30 0520) SpO2:  [91 %-97 %] 91 % (05/30 0520)  Intake/Output from previous day: 05/29 0701 - 05/30 0700 In: 360 [P.O.:360] Out: 1450 [Urine:1450] Intake/Output this shift:    Physical Exam: Strength in legs full.  Lab Results:  Recent Labs  07/24/12 1430  WBC 12.1*  HGB 9.9*  HCT 31.6*  PLT 259   BMET  Recent Labs  07/24/12 1430  NA 133*  K 4.8  CL 95*  CO2 24  GLUCOSE 356*  BUN 52*  CREATININE 1.81*  CALCIUM 9.0    Studies/Results: Ct Lumbar Spine Wo Contrast  07/25/2012   *RADIOLOGY REPORT*  Clinical Data: Evaluate hardware.  Lumbar fusion.  Increased back pain post fusion.  Right leg weakness.  CT LUMBAR SPINE WITHOUT CONTRAST  Technique:  Multidetector CT imaging of the lumbar spine was performed without intravenous contrast administration. Multiplanar CT image reconstructions were also generated.  Comparison: MRI 05/27/2012  Findings: Bilateral pedicle screw and posterior rod fusion L2-S1. Pedicle screws are in good position.  Discontinuity of the posterior rod on the right between L3 and L4.  Discontinuity of the left rod between L4 and L5.  Negative for acute fracture.  No mass lesion.  T11-T12:  Disc degeneration and spondylosis without spinal stenosis  T12-L1:  Mild disc degeneration.  L1-2:  5 mm posterior slip.  Severe disc degeneration with disc space narrowing, endplate irregularity, and extensive spondylosis. There is advanced facet hypertrophy.  There is moderate spinal stenosis and foraminal stenosis bilaterally.  Advanced degenerative changes are similar to prior studies.  L2-3:  Satisfactory fusion posteriorly and through the disc space. No significant stenosis  L3-4:  Pedicle  screw and interbody fusion.  Fusion appears solid. Posterior decompression without stenosis.  L4-5:  Pedicle screw and interbody fusion, probably solid. 3 mm anterior slip, unchanged.  Posterior decompression without significant stenosis  L5-S1:  Pedicle screw and interbody fusion.  Posterior spondylosis causing foraminal encroachment bilaterally left greater than right.  IMPRESSION: Pedicle screw fusion and interbody fusion L2-S1.  Hardware appears to be in satisfactory position.  No acute fracture.  Advanced disc and facet degeneration L1-2 causing spinal stenosis and foraminal encroachment, similar to prior studies.   Original Report Authenticated By: Carl Best, M.D.   Dg Chest Port 1 View  07/25/2012   *RADIOLOGY REPORT*  Clinical Data: Leukocytosis  PORTABLE CHEST - 1 VIEW  Comparison: None.  Findings: Normal mediastinum and cardiac silhouette.  Normal pulmonary  vasculature.  No evidence of effusion, infiltrate, or pneumothorax.  No acute bony abnormality.  IMPRESSION: No acute cardiopulmonary process.   Original Report Authenticated By: Suzy Bouchard, M.D.    Assessment/Plan: Appreciate Hospitalist assistance with renal failure.  Post-op pain not unexpected.  CT of lumbar spine looks good.  Mobilize as tolerated.    LOS: 2 days    Peggyann Shoals, MD 07/26/2012, 7:37 AM

## 2012-07-27 LAB — GLUCOSE, CAPILLARY
Glucose-Capillary: 98 mg/dL (ref 70–99)
Glucose-Capillary: 244 mg/dL — ABNORMAL HIGH (ref 70–99)
Glucose-Capillary: 132 mg/dL — ABNORMAL HIGH (ref 70–99)
Glucose-Capillary: 127 mg/dL — ABNORMAL HIGH (ref 70–99)

## 2012-07-27 MED ORDER — CYCLOBENZAPRINE HCL 10 MG PO TABS
5.0000 mg | ORAL_TABLET | Freq: Three times a day (TID) | ORAL | Status: DC | PRN
  Administered 2012-07-27 – 2012-07-28 (×4): 5 mg via ORAL
  Administered 2012-07-29: 09:00:00 via ORAL
  Administered 2012-07-29 – 2012-07-30 (×2): 5 mg via ORAL
  Administered 2012-07-30: via ORAL
  Filled 2012-07-27 (×2): qty 1
  Filled 2012-07-27: qty 2
  Filled 2012-07-27 (×6): qty 1

## 2012-07-27 MED ORDER — TRIAMCINOLONE ACETONIDE 0.1 % EX CREA
TOPICAL_CREAM | Freq: Two times a day (BID) | CUTANEOUS | Status: DC
  Administered 2012-07-27 – 2012-07-29 (×6): via TOPICAL
  Filled 2012-07-27: qty 15

## 2012-07-27 MED ORDER — INSULIN GLARGINE 100 UNIT/ML ~~LOC~~ SOLN
30.0000 [IU] | Freq: Every morning | SUBCUTANEOUS | Status: DC
  Administered 2012-07-27 – 2012-07-28 (×2): 30 [IU] via SUBCUTANEOUS
  Filled 2012-07-27 (×3): qty 0.3

## 2012-07-27 MED ORDER — DIPHENHYDRAMINE HCL 25 MG PO CAPS: 25.0000 mg | ORAL_CAPSULE | Freq: Four times a day (QID) | ORAL | Status: DC | PRN

## 2012-07-27 MED ORDER — BENAZEPRIL HCL 20 MG PO TABS
20.0000 mg | ORAL_TABLET | Freq: Every day | ORAL | Status: DC
  Administered 2012-07-27 – 2012-07-30 (×4): 20 mg via ORAL
  Filled 2012-07-27 (×4): qty 1

## 2012-07-27 NOTE — Progress Notes (Signed)
Subjective: Patient reports Multiple complaints from patient and daughter include pain in both lower extremities to the point of tears itching with rash need for IV.  Objective: Vital signs in last 24 hours: Temp:  [98.6 F (37 C)-99.2 F (37.3 C)] 98.6 F (37 C) (05/31 0521) Pulse Rate:  [76-84] 84 (05/31 0521) Resp:  [18-20] 20 (05/31 0521) BP: (150-186)/(64-69) 186/69 mmHg (05/31 0521) SpO2:  [95 %-97 %] 97 % (05/31 0521)  Intake/Output from previous day: 05/30 0701 - 05/31 0700 In: 1 [P.O.:1] Out: 2600 [Urine:2600] Intake/Output this shift: Total I/O In: 150 [I.V.:150] Out: -   Maculopapular rash on back. Motor function appears intact in both lower extremities.  Lab Results:  Recent Labs  07/24/12 1430 07/26/12 0826  WBC 12.1* 6.2  HGB 9.9* 9.4*  HCT 31.6* 30.1*  PLT 259 191   BMET  Recent Labs  07/24/12 1430 07/26/12 0826  NA 133* 139  K 4.8 4.0  CL 95* 104  CO2 24 28  GLUCOSE 356* 154*  BUN 52* 16  CREATININE 1.81* 0.69  CALCIUM 9.0 8.8    Studies/Results: Dg Chest Port 1 View  07/25/2012   *RADIOLOGY REPORT*  Clinical Data: Leukocytosis  PORTABLE CHEST - 1 VIEW  Comparison: None.  Findings: Normal mediastinum and cardiac silhouette.  Normal pulmonary  vasculature.  No evidence of effusion, infiltrate, or pneumothorax.  No acute bony abnormality.  IMPRESSION: No acute cardiopulmonary process.   Original Report Authenticated By: Suzy Bouchard, M.D.    Assessment/Plan: We'll add Benadryl for itch. Can hep well IV fluid.  LOS: 3 days  Continue foley due to Observe in hospital today. We'll see if leg cramps don't respond to Benadryl.   Aldon Hengst J 07/27/2012, 9:46 AM

## 2012-07-27 NOTE — Progress Notes (Signed)
[facility] PROGRESS NOTE  [name] [id number] DOB: Jul 25, 1933 DOA: 07/24/2012 PCP: Alonza Bogus, MD  Assessment/Plan: Acute renal failure:  -Renal fx has normalized -secondary to prerenal azotemia in the setting of ACE inhibitor and poor oral intake.  -D/c IVF -Urinalysis unremarkable   DM (diabetes mellitus), type 2, uncontrolled:  -Hemoglobin A1c of 8.3 -BS in the low 200's -Cont sliding scale coverage.  -Would cont to hold metformin for now, may resume home meds on d/c  -Continue Lantus and Tradjenta for now and increase Lantus dose to 30 units  Essential hypertension, benign:  -Currently uncontrolled -Continue Norvasc.  -Resume benazepril 20mg  per home regimen  -Continue hydralazine.  -Continue Toprol XL.  Generalized weakness:  -Likely in part related to dehydration. Rule out infection. Patient has mild leukocytosis   History of Breast cancer   Hyperlipidemia on statin   GERD (gastroesophageal reflux disease)   Left Posterior Calf Tenderness:  -Pt has been w/ SCD's  -LE dopplers negative for DVT   HPI/Subjective: Pt with out complaints.  Objective: Filed Vitals:   07/26/12 0520 07/26/12 1336 07/26/12 2149 07/27/12 0521  BP: 161/69 151/64 150/67 186/69  Pulse: 74 79 76 84  Temp: 97.4 F (36.3 C) 98.7 F (37.1 C) 99.2 F (37.3 C) 98.6 F (37 C)  TempSrc: Oral Oral Oral Oral  Resp: 20 18 18 20   Height:      Weight:      SpO2: 91% 95% 97% 97%    Intake/Output Summary (Last 24 hours) at 07/27/12 0807 Last data filed at 07/27/12 0525  Gross per 24 hour  Intake      1 ml  Output   2600 ml  Net  -2599 ml   Filed Weights   07/24/12 1432  Weight: 77.066 kg (169 lb 14.4 oz)    Exam:   General:  Awake, in NAD  Cardiovascular: regular, s1, s2  Respiratory: normal resp effort, no crackles or wheezing  Abdomen: soft, nondistended  Musculoskeletal: perfused, no clubbing or cyanosis   Data Reviewed: Basic Metabolic  Panel:  Recent Labs Lab 07/24/12 1430 07/26/12 0826  NA 133* 139  K 4.8 4.0  CL 95* 104  CO2 24 28  GLUCOSE 356* 154*  BUN 52* 16  CREATININE 1.81* 0.69  CALCIUM 9.0 8.8   Liver Function Tests: No results found for this basename: AST, ALT, ALKPHOS, BILITOT, PROT, ALBUMIN,  in the last 168 hours No results found for this basename: LIPASE, AMYLASE,  in the last 168 hours No results found for this basename: AMMONIA,  in the last 168 hours CBC:  Recent Labs Lab 07/24/12 1430 07/26/12 0826  WBC 12.1* 6.2  NEUTROABS 7.5  --   HGB 9.9* 9.4*  HCT 31.6* 30.1*  MCV 86.1 84.8  PLT 259 191   Cardiac Enzymes: No results found for this basename: CKTOTAL, CKMB, CKMBINDEX, TROPONINI,  in the last 168 hours BNP (last 3 results) No results found for this basename: PROBNP,  in the last 8760 hours CBG:  Recent Labs Lab 07/26/12 0917 07/26/12 1133 07/26/12 1634 07/26/12 2148 07/27/12 0641  GLUCAP 140* 190* 207* 200* 98    Recent Results (from the past 240 hour(s))  MRSA PCR SCREENING     Status: Abnormal   Collection Time    07/24/12  4:11 PM      Result Value Range Status   MRSA by PCR POSITIVE (*) NEGATIVE Final   Comment:  The GeneXpert MRSA Assay (FDA     approved for NASAL specimens     only), is one component of a     comprehensive MRSA colonization     surveillance program. It is not     intended to diagnose MRSA     infection nor to guide or     monitor treatment for     MRSA infections.     RESULT CALLED TO, READ BACK BY AND VERIFIED WITH:     RN C.MATTHAIS AT 2030 BY L.PITT 07/24/12     Studies: Dg Chest Port 1 View  07/25/2012   *RADIOLOGY REPORT*  Clinical Data: Leukocytosis  PORTABLE CHEST - 1 VIEW  Comparison: None.  Findings: Normal mediastinum and cardiac silhouette.  Normal pulmonary  vasculature.  No evidence of effusion, infiltrate, or pneumothorax.  No acute bony abnormality.  IMPRESSION: No acute cardiopulmonary process.   Original  Report Authenticated By: Suzy Bouchard, M.D.    Scheduled Meds: . amLODipine  10 mg Oral q morning - 10a  . anastrozole  1 mg Oral Q breakfast  . aspirin EC  81 mg Oral Daily  . atorvastatin  10 mg Oral QHS  . benazepril  20 mg Oral Daily  . Chlorhexidine Gluconate Cloth  6 each Topical Q0600  . docusate sodium  100 mg Oral BID  . enoxaparin (LOVENOX) injection  40 mg Subcutaneous Q24H  . gabapentin  300 mg Oral TID  . glyBURIDE  5 mg Oral BID WC  . hydrALAZINE  25 mg Oral TID  . insulin aspart  0-20 Units Subcutaneous TID WC  . insulin aspart  0-5 Units Subcutaneous QHS  . insulin glargine  30 Units Subcutaneous q morning - 10a  . linagliptin  5 mg Oral Daily  . metoprolol succinate  100 mg Oral Daily  . mupirocin ointment  1 application Nasal BID  . pantoprazole  40 mg Oral Daily   Continuous Infusions: . sodium chloride 75 mL/hr at 07/26/12 2352    Active Problems:   Breast cancer   Hyperlipidemia   GERD (gastroesophageal reflux disease)   Acute renal failure   Dehydration   DM (diabetes mellitus), type 2, uncontrolled   Essential hypertension, benign   Generalized weakness    Time spent: 75min    [name], [facility] Pager [phone]. If 7PM-7AM, please contact night-coverage at [email or web address], password [facility] 07/27/2012, 8:07 AM  LOS: 3 days

## 2012-07-27 NOTE — Progress Notes (Signed)
Physical Therapy Treatment Patient Details Name: [name] MRN: [id number] DOB: February 08, 1934 Today's Date: 07/27/2012 Time: [id number] PT Time Calculation (min): 28 min  PT Assessment / Plan / Recommendation Comments on Treatment Session  79 y.o. female admitted to [facility] for leg weakness and pain.  She had a recent lumbar surgery (per pt May 1st).  She is progressing well with mobility today with decreased assistance levels and increased gait distance.  You can still see that she is limited by leg weakness and has pain with transitional movements, but much improved today compared to yesterday.  She would benefit from [place] f/u at [place].      Follow Up Recommendations  Home health PT;Supervision for mobility/OOB     Does the patient have the potential to tolerate intense rehabilitation    NA  Barriers to Discharge   none      Equipment Recommendations  None recommended by PT    Recommendations for Other Services   none  Frequency Min 3X/week   Plan Discharge plan remains appropriate;Frequency remains appropriate    Precautions / Restrictions Precautions Precautions: Back Precaution Comments: Able to recall 1/3 precautions today.  Verbally reviewed all 3 back precautions, lifting restrictions and functional examples of when she might break back precautions.   Required Braces or Orthoses: Spinal Brace Spinal Brace: Lumbar corset;Applied in sitting position   Pertinent Vitals/Pain See vitals flow sheet.     Mobility  Bed Mobility Bed Mobility: Rolling Right;Right Sidelying to Sit;Sitting - Scoot to [employer] of Bed;Sit to Sidelying Right;Scooting to [facility] Rolling Right: 6: Modified independent (Device/Increase time);With rail Rolling Left: 6: Modified independent (Device/Increase time);With rail Right Sidelying to Sit: 5: Supervision;With rails;HOB flat Sitting - Scoot to Edge of Bed: 6: Modified independent (Device/Increase time);With rail Sit to Sidelying Right: 4: Min  assist;With rail;HOB flat Scooting to HOB: 6: Modified independent (Device/Increase time);With rail Details for Bed Mobility Assistance: pt with significant pain with getting from side lying to sitting.  She needed increased time and relied heavily on railing for all bed mobility.  Most assist needed getting legs back into bed.   Transfers Sit to Stand: 5: Supervision;From bed;With upper extremity assist Stand to Sit: 5: Supervision;With upper extremity assist;To bed Details for Transfer Assistance: sueprvision for safety as pt flexes her trunk forward to get to standing.  Verbal cues that she may want to decrease the amount of bending during this move.  I am not sure if she can, though because her legs are so weak.   Ambulation/Gait Ambulation/Gait Assistance: 4: Min guard Ambulation Distance (Feet): 90 Feet Assistive device: Rolling walker Ambulation/Gait Assistance Details: pt with limited endurance due to leg weakness.  Verbal cues for upright posture.   Gait Pattern: Step-through pattern;Shuffle;Trunk flexed      PT Goals Acute Rehab PT Goals PT Goal: Supine/Side to Sit - Progress: Progressing toward goal PT Goal: Sit to Supine/Side - Progress: Progressing toward goal PT Goal: Sit to Stand - Progress: Progressing toward goal PT Goal: Ambulate - Progress: Progressing toward goal Additional Goals Additional Goal #1: Pt able to verbalize and adhere to back precautions while mobilizing PT Goal: Additional Goal #1 - Progress: Goal set today  Visit Information  Last PT Received On: 07/27/12 Assistance Needed: +1    Subjective Data  Subjective: Pt reports that she has already walked once today with RN staff.  She wants to get better and get home.  She verbalizes that she will walk one more time later today as  well.    Cognition  Cognition Arousal/Alertness: Awake/alert Behavior During Therapy: WFL for tasks assessed/performed Overall Cognitive Status: Within Functional Limits for  tasks assessed       End of Session PT - End of Session Equipment Utilized During Treatment: Back brace Activity Tolerance: Patient limited by fatigue;Patient limited by pain Patient left: in bed;with call bell/phone within reach;with family/visitor present     Wells Guiles B. [place], [place], DPT [phone]   07/27/2012, 3:02 PM

## 2012-07-28 DIAGNOSIS — E1142 Type 2 diabetes mellitus with diabetic polyneuropathy: Secondary | ICD-10-CM

## 2012-07-28 DIAGNOSIS — E1149 Type 2 diabetes mellitus with other diabetic neurological complication: Secondary | ICD-10-CM

## 2012-07-28 LAB — BASIC METABOLIC PANEL
Calcium: 8.8 mg/dL (ref 8.4–10.5)
Creatinine, Ser: 0.73 mg/dL (ref 0.50–1.10)
GFR calc Af Amer: >90 mL/min (ref >90)
GFR calc non Af Amer: 81 mL/min — ABNORMAL LOW (ref >90)
Sodium: 132 mEq/L — ABNORMAL LOW (ref 135–145)

## 2012-07-28 LAB — GLUCOSE, CAPILLARY
Glucose-Capillary: 108 mg/dL — ABNORMAL HIGH (ref 70–99)
Glucose-Capillary: 456 mg/dL — ABNORMAL HIGH (ref 70–99)

## 2012-07-28 NOTE — Progress Notes (Signed)
Filed Vitals:   07/27/12 1358 07/27/12 1807 07/27/12 2140 07/28/12 0526  BP: 144/64 147/57 178/82 159/84  Pulse: 80 88 82 97  Temp: 98.4 F (36.9 C) 99 F (37.2 C) 98 F (36.7 C) 98.6 F (37 C)  TempSrc: Oral Oral Oral Oral  Resp: 18 20 20 20   Height:      Weight:      SpO2: 96% 100% 98% 96%    CBC  Recent Labs  07/26/12 0826  WBC 6.2  HGB 9.4*  HCT 30.1*  PLT 191   BMET  Recent Labs  07/26/12 0826  NA 139  K 4.0  CL 104  CO2 28  GLUCOSE 154*  BUN 16  CREATININE 0.69  CALCIUM 8.8    Patient seen yesterday on rounds by Dr. Kristeen Miss, who forgot to write a note. Patient much more comfortable. I started the patient on Flexeril 5 mg by mouth 3 times a day when necessary muscle spasms yesterday afternoon, and she's taken 2 doses, and already feels much more comfortable and much more mobile. Continuing PT and OT. Encouraged to ambulate actively in the halls.  Plan: Renal insufficiency resolved. Still complaining of discomfort in the right lower extremity, but feels it is significantly improved with the Flexeril. Encouraged increase activity as tolerated.  Hosie Spangle, MD 07/28/2012, 9:09 AM

## 2012-07-28 NOTE — Progress Notes (Signed)
Physical Therapy Treatment Patient Details Name: [name] MRN: [id number] DOB: 05/12/1933 Today's Date: 07/28/2012 Time: [id number] PT Time Calculation (min): 24 min  PT Assessment / Plan / Recommendation Comments on Treatment Session  79 y.o. female admitted to [facility] for leg weakness and pain. She had a recent lumbar surgery (per pt May 1st).   Pt able to perform stair negotiation this session however needed (A) to maintain balance.  Pt tends to be impulsive at baseline and continue to need cues to prevent bending and twisting.  New onset of external rotation with LLE with ambulation and per husband started yesterday 07/28/12.  Notified RN and left note for MD.  Will continue to assess    Follow Up Recommendations  Home health PT;Supervision for mobility/OOB     Does the patient have the potential to tolerate intense rehabilitation     Barriers to Discharge        Equipment Recommendations  None recommended by PT    Recommendations for Other Services    Frequency Min 3X/week   Plan Discharge plan remains appropriate;Frequency remains appropriate    Precautions / Restrictions Precautions Precautions: Back Precaution Booklet Issued: Yes (comment) Precaution Comments: Able to recall 2/3 back precautions with cues for no arching Required Braces or Orthoses: Spinal Brace Spinal Brace: Lumbar corset;Applied in sitting position Restrictions Weight Bearing Restrictions: No   Pertinent Vitals/Pain 6/10 back pain and right hip pain with ambulation    Mobility  Bed Mobility Bed Mobility: Right Sidelying to Sit Details for Bed Mobility Assistance: (A) with LE into bed Transfers Transfers: Sit to Stand;Stand to Sit;Stand Pivot Transfers Sit to Stand: 5: Supervision;With armrests;From chair/3-in-1;From toilet;4: Min assist Stand to Sit: 5: Supervision;To chair/3-in-1;To bed Details for Transfer Assistance: min (A) to stand from commode with max VCs for hand placement.    Ambulation/Gait Ambulation/Gait Assistance: 4: Min guard Ambulation Distance (Feet): 150 Feet Assistive device: Rolling walker Ambulation/Gait Assistance Details: Pt with intermittent standing rest breaks due to overall fatigue.  Noticeable left LE external rotation with increase fatigue.  Pt needed constant cues for upright posture and proper RW placement.  Pt tends to ambulate too far from RW. Gait Pattern: Step-through pattern;Shuffle;Trunk flexed Gait velocity: decreased Stairs: Yes Stairs Assistance: 4: Min assist Stairs Assistance Details (indicate cue type and reason): (A) to maintain balance and cues for proper step sequence Stair Management Technique: One rail Right;Forwards (HHA) Number of Stairs: 3 Wheelchair Mobility Wheelchair Mobility: No    Exercises     PT Diagnosis:    PT Problem List:   PT Treatment Interventions:     PT Goals Acute Rehab PT Goals PT Goal Formulation: With patient/family Time For Goal Achievement: 08/01/12 Potential to Achieve Goals: Good Pt will go Supine/Side to Sit: with modified independence PT Goal: Supine/Side to Sit - Progress: Progressing toward goal Pt will go Sit to Supine/Side: with modified independence PT Goal: Sit to Supine/Side - Progress: Progressing toward goal Pt will go Sit to Stand: with modified independence PT Goal: Sit to Stand - Progress: Progressing toward goal Pt will Ambulate: >150 feet;with modified independence;with least restrictive assistive device PT Goal: Ambulate - Progress: Progressing toward goal Pt will Go Up / Down Stairs: 1-2 stairs;with rail(s);with supervision PT Goal: Up/Down Stairs - Progress: Progressing toward goal Additional Goals Additional Goal #1: Pt able to verbalize and adhere to back precautions while mobilizing PT Goal: Additional Goal #1 - Progress: Progressing toward goal  Visit Information  Last PT Received On: 07/28/12 Assistance  Needed: +1    Subjective Data  Subjective: "I'm  ready to go for walk." Patient Stated Goal: return home with assistance   Cognition  Cognition Arousal/Alertness: Awake/alert Behavior During Therapy: WFL for tasks assessed/performed Overall Cognitive Status: Within Functional Limits for tasks assessed    Balance     End of Session PT - End of Session Equipment Utilized During Treatment: Back brace;Gait belt Activity Tolerance: Patient tolerated treatment well Patient left: in bed;with call bell/phone within reach;with family/visitor present Nurse Communication: Mobility status   GP     Rayane Gallardo 07/28/2012, 1:07 PM Antoine Poche, [place] DPT [phone]

## 2012-07-28 NOTE — Progress Notes (Signed)
[facility] PROGRESS NOTE  [name] [id number] DOB: March 05, 1933 DOA: 07/24/2012 PCP: Alonza Bogus, MD  Assessment/Plan: Acute renal failure:  -Renal fx has normalized  -secondary to prerenal azotemia in the setting of ACE inhibitor and poor oral intake.   DM (diabetes mellitus), type 2, uncontrolled:  -Hemoglobin A1c of 8.3  -BS now much improved  -Cont sliding scale coverage.  -Would cont to hold metformin for now, may resume home meds on d/c  -Continue Lantus and Tradjenta for now with Lantus at 30 units  Essential hypertension, benign:  -Currently poorly controlled - will monitor for now - pt reports bp is normally very well controlled at home -Continue Norvasc.  -Continue benazepril 20mg  per home regimen  -Continue hydralazine.  -Continue Toprol XL.  Generalized weakness:  -Likely in part related to dehydration. Rule out infection. Patient has mild leukocytosis  History of Breast cancer  Hyperlipidemia on statin  GERD (gastroesophageal reflux disease)  Left Posterior Calf Tenderness:  -Pt has been w/ SCD's  -LE dopplers negative for DVT   HPI/Subjective: Pt is without complaints. Reports voiding well.  Objective: Filed Vitals:   07/27/12 1358 07/27/12 1807 07/27/12 2140 07/28/12 0526  BP: 144/64 147/57 178/82 159/84  Pulse: 80 88 82 97  Temp: 98.4 F (36.9 C) 99 F (37.2 C) 98 F (36.7 C) 98.6 F (37 C)  TempSrc: Oral Oral Oral Oral  Resp: 18 20 20 20   Height:      Weight:      SpO2: 96% 100% 98% 96%    Intake/Output Summary (Last 24 hours) at 07/28/12 0820 Last data filed at 07/28/12 0100  Gross per 24 hour  Intake    480 ml  Output   2175 ml  Net  -1695 ml   Filed Weights   07/24/12 1432  Weight: 77.066 kg (169 lb 14.4 oz)    Exam:   General:  Awake in NAD  Cardiovascular: regular, s1, s2  Respiratory: normal resp effort, no crackles or wheezing  Abdomen: soft, nondistended  Musculoskeletal: perfused, no clubbing    Data Reviewed: Basic Metabolic Panel:  Recent Labs Lab 07/24/12 1430 07/26/12 0826  NA 133* 139  K 4.8 4.0  CL 95* 104  CO2 24 28  GLUCOSE 356* 154*  BUN 52* 16  CREATININE 1.81* 0.69  CALCIUM 9.0 8.8   Liver Function Tests: No results found for this basename: AST, ALT, ALKPHOS, BILITOT, PROT, ALBUMIN,  in the last 168 hours No results found for this basename: LIPASE, AMYLASE,  in the last 168 hours No results found for this basename: AMMONIA,  in the last 168 hours CBC:  Recent Labs Lab 07/24/12 1430 07/26/12 0826  WBC 12.1* 6.2  NEUTROABS 7.5  --   HGB 9.9* 9.4*  HCT 31.6* 30.1*  MCV 86.1 84.8  PLT 259 191   Cardiac Enzymes: No results found for this basename: CKTOTAL, CKMB, CKMBINDEX, TROPONINI,  in the last 168 hours BNP (last 3 results) No results found for this basename: PROBNP,  in the last 8760 hours CBG:  Recent Labs Lab 07/27/12 0641 07/27/12 1146 07/27/12 1639 07/27/12 2139 07/28/12 0630  GLUCAP 98 244* 132* 127* 108*    Recent Results (from the past 240 hour(s))  MRSA PCR SCREENING     Status: Abnormal   Collection Time    07/24/12  4:11 PM      Result Value Range Status   MRSA by PCR POSITIVE (*) NEGATIVE Final   Comment:  The GeneXpert MRSA Assay (FDA     approved for NASAL specimens     only), is one component of a     comprehensive MRSA colonization     surveillance program. It is not     intended to diagnose MRSA     infection nor to guide or     monitor treatment for     MRSA infections.     RESULT CALLED TO, READ BACK BY AND VERIFIED WITH:     RN C.MATTHAIS AT 2030 BY L.PITT 07/24/12     Studies: No results found.  Scheduled Meds: . amLODipine  10 mg Oral q morning - 10a  . anastrozole  1 mg Oral Q breakfast  . aspirin EC  81 mg Oral Daily  . atorvastatin  10 mg Oral QHS  . benazepril  20 mg Oral Daily  . Chlorhexidine Gluconate Cloth  6 each Topical Q0600  . docusate sodium  100 mg Oral BID  . enoxaparin  (LOVENOX) injection  40 mg Subcutaneous Q24H  . gabapentin  300 mg Oral TID  . glyBURIDE  5 mg Oral BID WC  . hydrALAZINE  25 mg Oral TID  . insulin aspart  0-20 Units Subcutaneous TID WC  . insulin aspart  0-5 Units Subcutaneous QHS  . insulin glargine  30 Units Subcutaneous q morning - 10a  . linagliptin  5 mg Oral Daily  . metoprolol succinate  100 mg Oral Daily  . mupirocin ointment  1 application Nasal BID  . pantoprazole  40 mg Oral Daily  . triamcinolone cream   Topical BID   Continuous Infusions:   Active Problems:   Breast cancer   Hyperlipidemia   GERD (gastroesophageal reflux disease)   Acute renal failure   Dehydration   DM (diabetes mellitus), type 2, uncontrolled   Essential hypertension, benign   Generalized weakness    Time spent: 16min    CHIU, [facility] Pager [phone]. If 7PM-7AM, please contact night-coverage at [email or web address], password [facility] 07/28/2012, 8:20 AM  LOS: 4 days

## 2012-07-29 DIAGNOSIS — M129 Arthropathy, unspecified: Secondary | ICD-10-CM

## 2012-07-29 LAB — BASIC METABOLIC PANEL
BUN: 11 mg/dL (ref 6–23)
Creatinine, Ser: 0.62 mg/dL (ref 0.50–1.10)
GFR calc Af Amer: >90 mL/min (ref >90)
GFR calc non Af Amer: 85 mL/min — ABNORMAL LOW (ref >90)
Glucose, Bld: 202 mg/dL — ABNORMAL HIGH (ref 70–99)
Potassium: 3.4 mEq/L — ABNORMAL LOW (ref 3.5–5.1)

## 2012-07-29 LAB — GLUCOSE, CAPILLARY
Glucose-Capillary: 232 mg/dL — ABNORMAL HIGH (ref 70–99)
Glucose-Capillary: 234 mg/dL — ABNORMAL HIGH (ref 70–99)
Glucose-Capillary: 130 mg/dL — ABNORMAL HIGH (ref 70–99)

## 2012-07-29 MED ORDER — INSULIN GLARGINE 100 UNIT/ML ~~LOC~~ SOLN
35.0000 [IU] | Freq: Every morning | SUBCUTANEOUS | Status: DC
  Administered 2012-07-29 – 2012-07-30 (×2): 35 [IU] via SUBCUTANEOUS
  Filled 2012-07-29 (×2): qty 0.35

## 2012-07-29 MED ORDER — POTASSIUM CHLORIDE 20 MEQ PO PACK
40.0000 meq | PACK | Freq: Once | ORAL | Status: DC
  Filled 2012-07-29: qty 2

## 2012-07-29 MED ORDER — POTASSIUM CHLORIDE CRYS ER 20 MEQ PO TBCR
40.0000 meq | EXTENDED_RELEASE_TABLET | Freq: Once | ORAL | Status: AC
  Administered 2012-07-29: 40 meq via ORAL
  Filled 2012-07-29: qty 2

## 2012-07-29 NOTE — Progress Notes (Signed)
Subjective: Patient reports feeling better today  Objective: Vital signs in last 24 hours: Temp:  [97.8 F (36.6 C)-98.6 F (37 C)] 98 F (36.7 C) (06/02 0600) Pulse Rate:  [87-98] 88 (06/02 1156) Resp:  [20] 20 (06/02 0600) BP: (140-171)/(61-80) 140/70 mmHg (06/02 1156) SpO2:  [98 %-100 %] 99 % (06/02 0600)  Intake/Output from previous day: 06/01 0701 - 06/02 0700 In: 360 [P.O.:360] Out: 950 [Urine:950] Intake/Output this shift:    Physical Exam: Full strength both legs  Lab Results: No results found for this basename: WBC, HGB, HCT, PLT,  in the last 72 hours BMET  Recent Labs  07/28/12 1245 07/29/12 0435  NA 132* 140  K 3.9 3.4*  CL 94* 100  CO2 28 29  GLUCOSE 493* 202*  BUN 11 11  CREATININE 0.73 0.62  CALCIUM 8.8 8.7    Studies/Results: No results found.  Assessment/Plan: Mobilizing better and getting relief of pain with flexeril.  Continue to mobilize.  Prerenal azotemia resolved.  D/C home in am if continuing to do well.     LOS: 5 days    Peggyann Shoals, MD 07/29/2012, 2:41 PM

## 2012-07-29 NOTE — Progress Notes (Signed)
Physical Therapy Treatment Patient Details Name: [name] MRN: [id number] DOB: [birth year] Today's Date: 07/29/2012 Time: [id number] PT Time Calculation (min): 23 min  PT Assessment / Plan / Recommendation Comments on Treatment Session  79 y/o female adm. for leg weakness and pain. Recent lumbar surgery (may 1). Progressing nicely. Ambulating more with nsg staff. Focus of our session on education for home including posture and rest breaks as she fatigues during gait. Continue to rec HHPT.     Follow Up Recommendations  Home health PT;Supervision for mobility/OOB     Does the patient have the potential to tolerate intense rehabilitation     Barriers to Discharge        Equipment Recommendations  None recommended by PT    Recommendations for Other Services    Frequency Min 3X/week   Plan Discharge plan remains appropriate;Frequency remains appropriate    Precautions / Restrictions Precautions Precautions: Back Spinal Brace: Lumbar corset;Applied in sitting position Restrictions Weight Bearing Restrictions: No   Pertinent Vitals/Pain Reports minimal right leg/back pain prior to session    Mobility  Bed Mobility Bed Mobility: Not assessed Transfers Sit to Stand: 6: Modified independent (Device/Increase time);From chair/3-in-1;With upper extremity assist Stand to Sit: 6: Modified independent (Device/Increase time);To chair/3-in-1;With upper extremity assist Ambulation/Gait Ambulation/Gait Assistance: 4: Min guard;4: Min Wellsite geologist (Feet): 200 Feet Assistive device: Rolling walker Ambulation/Gait Assistance Details: min tactile and verbal cues for tall and midline posture especially as she fatigues needing several standing rests to regain proper posture/form Gait Pattern: Step-through pattern General Gait Details: as she fatigues her upper trunk shifts to the left with pelvic shift to the right, LLE externally rotates to compensate for this shift (daughter  reports this is how she has been walking several months prior to surgery) Stairs: No      PT Goals Acute Rehab PT Goals PT Goal: Sit to Stand - Progress: Met PT Goal: Ambulate - Progress: Progressing toward goal  Visit Information  Last PT Received On: 07/29/12 Assistance Needed: +1    Subjective Data  Subjective: I feel better since they gave me that flexeril.  Patient Stated Goal: home   Cognition  Cognition Arousal/Alertness: Awake/alert Behavior During Therapy: WFL for tasks assessed/performed Overall Cognitive Status: Within Functional Limits for tasks assessed    Balance  Balance Balance Assessed:  (cueing for midline posture) Static Standing Balance Static Standing - Balance Support: Bilateral upper extremity supported Static Standing - Level of Assistance: 5: Stand by assistance  End of Session PT - End of Session Equipment Utilized During Treatment: Back brace;Gait belt Activity Tolerance: Patient tolerated treatment well Patient left: in chair;with call bell/phone within reach;with family/visitor present Nurse Communication: Mobility status   GP     Armstrong 07/29/2012, 9:09 AM

## 2012-07-29 NOTE — Progress Notes (Signed)
[facility] PROGRESS NOTE  [name] [id number] DOB: Apr 30, 1933 DOA: 07/24/2012 PCP: Alonza Bogus, MD  Assessment/Plan: Acute renal failure:  -Renal fx has normalized  -secondary to prerenal azotemia in the setting of ACE inhibitor and poor oral intake.   DM (diabetes mellitus), type 2, uncontrolled:  -Hemoglobin A1c of 8.3  -BS now much improved, still in 150-200's  -Cont sliding scale coverage.  -Would cont to hold metformin for now, may resume home meds on d/c  -Continue Lantus and Tradjenta for now with Lantus increased to 35 units   Essential hypertension, benign:  -Currently poorly controlled - will monitor for now - pt reports bp is normally very well controlled at home  -Elevated BP likely secondary to associated pain -Continue Norvasc.  -Continue benazepril 20mg  per home regimen  -Continue hydralazine.  -Continue Toprol XL.  Generalized weakness:  -Likely in part related to dehydration. Rule out infection. Patient has mild leukocytosis   History of Breast cancer   Hyperlipidemia on statin   GERD (gastroesophageal reflux disease)   Left Posterior Calf Tenderness:  -Pt has been w/ SCD's  -LE dopplers negative for DVT  Hypokalemia: -Will replace  HPI/Subjective: Pt reports continued hip pains.  Objective: Filed Vitals:   07/28/12 0526 07/28/12 1526 07/28/12 2200 07/29/12 0600  BP: 159/84 145/61 144/80 171/77  Pulse: 97 88 98 87  Temp: 98.6 F (37 C) 98.6 F (37 C) 97.8 F (36.6 C) 98 F (36.7 C)  TempSrc: Oral Oral Oral Oral  Resp: 20 20 20 20   Height:      Weight:      SpO2: 96% 98% 100% 99%    Intake/Output Summary (Last 24 hours) at 07/29/12 [id number] Last data filed at 07/28/12 1058  Gross per 24 hour  Intake    360 ml  Output      0 ml  Net    360 ml   Filed Weights   07/24/12 1432  Weight: 77.066 kg (169 lb 14.4 oz)    Exam:   General:  Awake, in NAD  Cardiovascular: regular, s1, s2  Respiratory: normal  respirtatory effort, no crackles or wheezing  Abdomen: softe, nondistended  Musculoskeletal: perfused, no clubbing   Data Reviewed: Basic Metabolic Panel:  Recent Labs Lab 07/24/12 1430 07/26/12 0826 07/28/12 1245 07/29/12 0435  NA 133* 139 132* 140  K 4.8 4.0 3.9 3.4*  CL 95* 104 94* 100  CO2 24 28 28 29   GLUCOSE 356* 154* 493* 202*  BUN 52* 16 11 11   CREATININE 1.81* 0.69 0.73 0.62  CALCIUM 9.0 8.8 8.8 8.7   Liver Function Tests: No results found for this basename: AST, ALT, ALKPHOS, BILITOT, PROT, ALBUMIN,  in the last 168 hours No results found for this basename: LIPASE, AMYLASE,  in the last 168 hours No results found for this basename: AMMONIA,  in the last 168 hours CBC:  Recent Labs Lab 07/24/12 1430 07/26/12 0826  WBC 12.1* 6.2  NEUTROABS 7.5  --   HGB 9.9* 9.4*  HCT 31.6* 30.1*  MCV 86.1 84.8  PLT 259 191   Cardiac Enzymes: No results found for this basename: CKTOTAL, CKMB, CKMBINDEX, TROPONINI,  in the last 168 hours BNP (last 3 results) No results found for this basename: PROBNP,  in the last 8760 hours CBG:  Recent Labs Lab 07/28/12 1205 07/28/12 1701 07/28/12 2104 07/29/12 0205 07/29/12 0638  GLUCAP 456* 136* 232* 234* 176*    Recent Results (from the past 240 hour(s))  MRSA  PCR SCREENING     Status: Abnormal   Collection Time    07/24/12  4:11 PM      Result Value Range Status   MRSA by PCR POSITIVE (*) NEGATIVE Final   Comment:            The GeneXpert MRSA Assay (FDA     approved for NASAL specimens     only), is one component of a     comprehensive MRSA colonization     surveillance program. It is not     intended to diagnose MRSA     infection nor to guide or     monitor treatment for     MRSA infections.     RESULT CALLED TO, READ BACK BY AND VERIFIED WITH:     RN C.MATTHAIS AT 2030 BY L.PITT 07/24/12     Studies: No results found.  Scheduled Meds: . amLODipine  10 mg Oral q morning - 10a  . anastrozole  1 mg Oral Q  breakfast  . aspirin EC  81 mg Oral Daily  . atorvastatin  10 mg Oral QHS  . benazepril  20 mg Oral Daily  . Chlorhexidine Gluconate Cloth  6 each Topical Q0600  . docusate sodium  100 mg Oral BID  . enoxaparin (LOVENOX) injection  40 mg Subcutaneous Q24H  . gabapentin  300 mg Oral TID  . glyBURIDE  5 mg Oral BID WC  . hydrALAZINE  25 mg Oral TID  . insulin aspart  0-20 Units Subcutaneous TID WC  . insulin aspart  0-5 Units Subcutaneous QHS  . insulin glargine  30 Units Subcutaneous q morning - 10a  . linagliptin  5 mg Oral Daily  . metoprolol succinate  100 mg Oral Daily  . mupirocin ointment  1 application Nasal BID  . pantoprazole  40 mg Oral Daily  . triamcinolone cream   Topical BID   Continuous Infusions:   Active Problems:   Breast cancer   Hyperlipidemia   GERD (gastroesophageal reflux disease)   Acute renal failure   Dehydration   DM (diabetes mellitus), type 2, uncontrolled   Essential hypertension, benign   Generalized weakness    Time spent: 63min    Wynn Kernes, [facility] Pager [phone]. If 7PM-7AM, please contact night-coverage at [email or web address], password [facility] 07/29/2012, 8:22 AM  LOS: 5 days

## 2012-07-30 LAB — GLUCOSE, CAPILLARY: Glucose-Capillary: 365 mg/dL — ABNORMAL HIGH (ref 70–99)

## 2012-07-30 NOTE — Progress Notes (Signed)
Physical Therapy Treatment and Discharge Patient Details Name: [name] MRN: [id number] DOB: [birth year] Today's Date: 07/30/2012 Time: [id number] PT Time Calculation (min): 16 min  PT Assessment / Plan / Recommendation Comments on Treatment Session  79 y/o female adm. for leg weakness and pain. Recent lumbar surgery (may1). Session focus on education for gait training and posture retraining at home. Daughter verbalized technique of standing in front of mirror to correct posture. Reviewed importance of ambulating several short walks daily to increase strength in legs and back and taking rest breaks as she fatigues. Plan is to d/c home today. No further acute PT needs.     Follow Up Recommendations  Home health PT;Supervision for mobility/OOB     Does the patient have the potential to tolerate intense rehabilitation     Barriers to Discharge        Equipment Recommendations  None recommended by PT    Recommendations for Other Services    Frequency Min 3X/week   Plan Discharge plan remains appropriate;Frequency remains appropriate    Precautions / Restrictions Precautions Precautions: Back Precaution Comments: Able to recall 2/3 back precautions with cues for no arching Required Braces or Orthoses: Spinal Brace Spinal Brace: Lumbar corset;Applied in sitting position Restrictions Other Position/Activity Restrictions: reviewed positioning of lumbar brace   Pertinent Vitals/Pain Reports 8/10 pain in her back, RN made aware and providing pain meds    Mobility  Bed Mobility Bed Mobility: Not assessed Details for Bed Mobility Assistance: pt denies concerns with bed mobility Transfers Transfers: Sit to Stand;Stand to Sit Sit to Stand: 6: Modified independent (Device/Increase time);From chair/3-in-1;With upper extremity assist Stand to Sit: 5: Supervision;With upper extremity assist Details for Transfer Assistance: cues for safe positioning with RW when going to sit as pt tends  to leave RW and twist trunk around with increased flexion to find the seat Ambulation/Gait Ambulation/Gait Assistance: 5: Supervision Ambulation Distance (Feet): 200 Feet Ambulation/Gait Assistance Details: seated rest after 100 ft, cues for tall and midline posture and several standing rests to regain tall posture, flexes forward and to the left as she fatigues General Gait Details: as she fatigues her upper trunk shifts to the left with pelvic shift to the right, LLE externally rotates to compensate for this shift (daughter reports this is how she has been walking several months prior to surgery) Stairs: No Stairs Assistance Details (indicate cue type and reason): denies concerns with stairs, declines practicing today      PT Goals Acute Rehab PT Goals PT Goal: Sit to Stand - Progress: Met PT Goal: Ambulate - Progress: Progressing toward goal Additional Goals PT Goal: Additional Goal #1 - Progress: Progressing toward goal  Visit Information  Last PT Received On: 07/30/12 Assistance Needed: +1    Subjective Data  Subjective: Im ready to go home.  Patient Stated Goal: home   Cognition  Cognition Arousal/Alertness: Awake/alert Behavior During Therapy: WFL for tasks assessed/performed Overall Cognitive Status: Within Functional Limits for tasks assessed    Balance     End of Session PT - End of Session Equipment Utilized During Treatment: Back brace;Gait belt Activity Tolerance: Patient tolerated treatment well Patient left: in chair;with call bell/phone within reach Nurse Communication: Mobility status   GP     Kincaid 07/30/2012, 8:55 AM

## 2012-07-30 NOTE — Progress Notes (Signed)
Inpatient Diabetes Program Recommendations  AACE/ADA: New Consensus Statement on Inpatient Glycemic Control (2013)  Target Ranges:  Prepandial:   less than 140 mg/dL      Peak postprandial:   less than 180 mg/dL (1-2 hours)      Critically ill patients:  140 - 180 mg/dL   Reason for Assessment: Hyperglycemia  Results for [name], [name] (MRN [id number]) as of 07/30/2012 11:27  Ref. Range 07/28/2012 06:30 07/28/2012 12:05 07/28/2012 17:01 07/28/2012 21:04 07/29/2012 02:05 07/29/2012 06:38 07/29/2012 12:21 07/29/2012 17:15 07/29/2012 21:11 07/30/2012 06:39  Glucose-Capillary Latest Range: 70-99 mg/dL 108 (H) 456 (H) 136 (H) 232 (H) 234 (H) 176 (H) 243 (H) 130 (H) 365 (H) 218 (H)   Note:  Pattern of CBG's for last two days indicate that patient may benefit from addition of Novolog meal coverage-- 5 units with each meal-- in addition to correction scale.  (To be held if CBG <80 mg/dl or eats < 50% of meal.)  Thank you. Blong Busk S. Marcelline Mates, RN, CNS, CDE Inpatient Diabetes Program, team pager [phone]

## 2012-07-30 NOTE — Progress Notes (Signed)
Subjective: Patient reports "I feel pretty good. I think I'm ready to go home."  Objective: Vital signs in last 24 hours: Temp:  [97.8 F (36.6 C)-98.2 F (36.8 C)] 97.8 F (36.6 C) (06/03 0600) Pulse Rate:  [88-102] 94 (06/03 0600) Resp:  [20] 20 (06/03 0600) BP: (109-173)/(60-81) 173/81 mmHg (06/03 0600) SpO2:  [100 %] 100 % (06/03 0600)  Intake/Output from previous day: 06/02 0701 - 06/03 0700 In: 460 [P.O.:460] Out: -  Intake/Output this shift:    Alert, conversant. Daughter present reports much less frequest confusion and much fewer reports of spasms. Pt reports "some" lumbar pain persists, but spasms well controlled by Flexeril. Good strength BLE.  Lab Results: No results found for this basename: WBC, HGB, HCT, PLT,  in the last 72 hours BMET  Recent Labs  07/28/12 1245 07/29/12 0435  NA 132* 140  K 3.9 3.4*  CL 94* 100  CO2 28 29  GLUCOSE 493* 202*  BUN 11 11  CREATININE 0.73 0.62  CALCIUM 8.8 8.7    Studies/Results: No results found.  Assessment/Plan: Improved   LOS: 6 days  Per Dr. Vertell Limber, d/c IV, d/c to home. Rx's for Fleseril & Hydrocodone to chart. Pt verbalizes understanding of d/c instructions. She will call office for 3-4 week appt with Dr. Vertell Limber.   Verdis Prime 07/30/2012, 7:31 AM

## 2012-07-30 NOTE — Progress Notes (Signed)
Pt c/o pain to back medicated and discharge instruction given to pt and daughter ,both verbalized good understanding . Home health Pt /oOb recommended by PT notified MD nurse to get order but pt and daughter stated pt does  not need it a case management also made aware of the declined. Vs stable prior to d/c.

## 2012-07-30 NOTE — Progress Notes (Signed)
Doing well  DC home

## 2012-07-30 NOTE — Discharge Summary (Signed)
Physician Discharge Summary  Patient ID: [name] MRN: [id number] DOB/AGE: [birth year] 77 y.o.  Admit date: 07/24/2012 Discharge date: 07/30/2012  Admission Diagnoses: Lumbar pain, generalized weakness; acute renal failure, prerenal azotemia  Discharge Diagnoses: Lumbar pain, generalized weakness; acute renal failure, prerenal azotemia; Lumbar pain improved; weakness, renal failure, and prerenal azotemia resolved.  Active Problems:   Breast cancer   Hyperlipidemia   GERD (gastroesophageal reflux disease)   Acute renal failure   Dehydration   DM (diabetes mellitus), type 2, uncontrolled   Essential hypertension, benign   Generalized weakness   Discharged Condition: good  Hospital Course: [name] was admitted to [facility] after recent falls and lumbar/bilat leg pain not responsive to narcotic pain medications and steroids. Upon visiting the office, she was unable to exit her vehicle with her family's assistance. Admission for CT assessment of lumbar fusion hardware and control of pain was initiated. Medicine consult for renal failure, PT/OT for weakness/mobility. Resolution of renal failure with medical management. Pain/spasms controlled with medication changes.    Consults: Hospitalist Service  Significant Diagnostic Studies: radiology: Ultrasound: LLE Doppler negative  Treatments: IV hydration, medical management  Discharge Exam: Blood pressure 173/81, pulse 94, temperature 97.8 F (36.6 C), temperature source Oral, resp. rate 20, height 5\' 2"  (1.575 m), weight 77.066 kg (169 lb 14.4 oz), SpO2 100.00%. Alert, conversant. Daughter present reports much less frequest confusion and much fewer reports of spasms. Pt reports "some" lumbar pain persists, but spasms well controlled by Flexeril. Good strength BLE. Prerenal azotemia resolved.     Disposition: Discharge to home. Rx's for Flexeril & Hydrocodone to chart. (She will not take Oxycodone when home) Pt verbalizes  understanding of d/c instructions. She will call office for 3-4 week appt with Dr. Vertell Limber.     Future Appointments Provider Department Dept Phone   08/06/2012 11:10 AM Merrie Roof, MD [facility], [place] [phone]       Medication List    ASK your doctor about these medications       amLODipine 10 MG tablet  Commonly known as:  NORVASC  Take 10 mg by mouth every morning.     anastrozole 1 MG tablet  Commonly known as:  ARIMIDEX  Take 1 mg by mouth daily with breakfast.     aspirin EC 81 MG tablet  Take 81 mg by mouth daily.     atorvastatin 10 MG tablet  Commonly known as:  LIPITOR  Take 10 mg by mouth at bedtime.     benazepril 20 MG tablet  Commonly known as:  LOTENSIN  Take 20 mg by mouth every morning.     CALCIUM-VITAMIN D PO  Take 1 tablet by mouth daily. 1200 mg/500 mg     docusate sodium 100 MG capsule  Commonly known as:  COLACE  Take 100 mg by mouth 2 (two) times daily as needed for constipation.     DULoxetine 60 MG capsule  Commonly known as:  CYMBALTA  Take 60 mg by mouth at bedtime.     EPIPEN 2-PAK 0.3 mg/0.3 mL Devi  Generic drug:  EPINEPHrine  Inject 0.3 mg into the muscle once as needed. For anaphylaxis     gabapentin 300 MG capsule  Commonly known as:  NEURONTIN  Take 300 mg by mouth 3 (three) times daily.     glyBURIDE-metformin 2.5-500 MG per tablet  Commonly known as:  GLUCOVANCE  Take 2 tablets by mouth 2 (two) times daily with a meal.  hydrALAZINE 25 MG tablet  Commonly known as:  APRESOLINE  Take 25 mg by mouth 3 (three) times daily.     LANTUS SOLOSTAR 100 UNIT/ML injection  Generic drug:  insulin glargine  Inject 50 Units into the skin every morning.     metoprolol succinate 100 MG 24 hr tablet  Commonly known as:  TOPROL-XL  Take 100 mg by mouth daily. Take with or immediately following a meal.     omeprazole 20 MG capsule  Commonly known as:  PRILOSEC  Take 20 mg by mouth at bedtime.     OPCON-A OP   Place 1 drop into both eyes daily as needed. For dry eyes     oxyCODONE 5 MG immediate release tablet  Commonly known as:  Oxy IR/ROXICODONE  Take 10 mg by mouth every 3 (three) hours.     sitaGLIPtin 100 MG tablet  Commonly known as:  JANUVIA  Take 100 mg by mouth every morning.         Signed: Verdis Prime 07/30/2012, 7:37 AM

## 2012-08-05 ENCOUNTER — Encounter (HOSPITAL_COMMUNITY): Payer: Self-pay | Admitting: Emergency Medicine

## 2012-08-05 ENCOUNTER — Emergency Department (HOSPITAL_COMMUNITY)
Admission: EM | Admit: 2012-08-05 | Discharge: 2012-08-05 | Disposition: A | Discharge status: Home / Self Care | Payer: Medicare Other | Attending: Emergency Medicine | Admitting: Emergency Medicine

## 2012-08-05 DIAGNOSIS — Z9889 Other specified postprocedural states: Secondary | ICD-10-CM | POA: Insufficient documentation

## 2012-08-05 DIAGNOSIS — Z8719 Personal history of other diseases of the digestive system: Secondary | ICD-10-CM | POA: Insufficient documentation

## 2012-08-05 DIAGNOSIS — R5381 Other malaise: Secondary | ICD-10-CM | POA: Insufficient documentation

## 2012-08-05 DIAGNOSIS — H269 Unspecified cataract: Secondary | ICD-10-CM | POA: Insufficient documentation

## 2012-08-05 DIAGNOSIS — Z79899 Other long term (current) drug therapy: Secondary | ICD-10-CM | POA: Insufficient documentation

## 2012-08-05 DIAGNOSIS — Z794 Long term (current) use of insulin: Secondary | ICD-10-CM | POA: Insufficient documentation

## 2012-08-05 DIAGNOSIS — E162 Hypoglycemia, unspecified: Secondary | ICD-10-CM

## 2012-08-05 DIAGNOSIS — E1169 Type 2 diabetes mellitus with other specified complication: Secondary | ICD-10-CM | POA: Insufficient documentation

## 2012-08-05 DIAGNOSIS — I129 Hypertensive chronic kidney disease with stage 1 through stage 4 chronic kidney disease, or unspecified chronic kidney disease: Secondary | ICD-10-CM | POA: Insufficient documentation

## 2012-08-05 DIAGNOSIS — W010XXA Fall on same level from slipping, tripping and stumbling without subsequent striking against object, initial encounter: Secondary | ICD-10-CM | POA: Insufficient documentation

## 2012-08-05 DIAGNOSIS — F489 Nonpsychotic mental disorder, unspecified: Secondary | ICD-10-CM | POA: Insufficient documentation

## 2012-08-05 DIAGNOSIS — K219 Gastro-esophageal reflux disease without esophagitis: Secondary | ICD-10-CM | POA: Insufficient documentation

## 2012-08-05 DIAGNOSIS — R531 Weakness: Secondary | ICD-10-CM

## 2012-08-05 DIAGNOSIS — Y9229 Other specified public building as the place of occurrence of the external cause: Secondary | ICD-10-CM | POA: Insufficient documentation

## 2012-08-05 DIAGNOSIS — Z87442 Personal history of urinary calculi: Secondary | ICD-10-CM | POA: Insufficient documentation

## 2012-08-05 DIAGNOSIS — N189 Chronic kidney disease, unspecified: Secondary | ICD-10-CM | POA: Insufficient documentation

## 2012-08-05 DIAGNOSIS — E785 Hyperlipidemia, unspecified: Secondary | ICD-10-CM | POA: Insufficient documentation

## 2012-08-05 DIAGNOSIS — M129 Arthropathy, unspecified: Secondary | ICD-10-CM | POA: Insufficient documentation

## 2012-08-05 DIAGNOSIS — Z7982 Long term (current) use of aspirin: Secondary | ICD-10-CM | POA: Insufficient documentation

## 2012-08-05 DIAGNOSIS — Z85820 Personal history of malignant melanoma of skin: Secondary | ICD-10-CM | POA: Insufficient documentation

## 2012-08-05 DIAGNOSIS — R5383 Other fatigue: Secondary | ICD-10-CM | POA: Insufficient documentation

## 2012-08-05 DIAGNOSIS — Y9389 Activity, other specified: Secondary | ICD-10-CM | POA: Insufficient documentation

## 2012-08-05 DIAGNOSIS — Z853 Personal history of malignant neoplasm of breast: Secondary | ICD-10-CM | POA: Insufficient documentation

## 2012-08-05 LAB — URINALYSIS, ROUTINE W REFLEX MICROSCOPIC
Bilirubin Urine: NEGATIVE
Glucose, UA: NEGATIVE mg/dL
Hgb urine dipstick: NEGATIVE
Specific Gravity, Urine: 1.025 (ref 1.005–1.030)

## 2012-08-05 LAB — BASIC METABOLIC PANEL
BUN: 28 mg/dL — ABNORMAL HIGH (ref 6–23)
CO2: 27 mEq/L (ref 19–32)
Calcium: 9.4 mg/dL (ref 8.4–10.5)
Glucose, Bld: 39 mg/dL — CL (ref 70–99)
Potassium: 4.8 mEq/L (ref 3.5–5.1)
Sodium: 138 mEq/L (ref 135–145)

## 2012-08-05 LAB — URINE MICROSCOPIC-ADD ON

## 2012-08-05 LAB — CBC
HCT: 31.1 % — ABNORMAL LOW (ref 36.0–46.0)
Hemoglobin: 9.4 g/dL — ABNORMAL LOW (ref 12.0–15.0)
MCH: 26.4 pg (ref 26.0–34.0)
RBC: 3.56 MIL/uL — ABNORMAL LOW (ref 3.87–5.11)

## 2012-08-05 LAB — GLUCOSE, CAPILLARY: Glucose-Capillary: 81 mg/dL (ref 70–99)

## 2012-08-05 MED ORDER — DEXTROSE 50 % IV SOLN: 1.0000 | Freq: Once | INTRAVENOUS | Status: AC

## 2012-08-05 MED ORDER — DEXTROSE 50 % IV SOLN
INTRAVENOUS | Status: AC
  Administered 2012-08-05: 50 mL via INTRAVENOUS
  Filled 2012-08-05: qty 50

## 2012-08-05 MED ORDER — SODIUM CHLORIDE 0.9 % IV BOLUS (SEPSIS)
1000.0000 mL | Freq: Once | INTRAVENOUS | Status: AC
  Administered 2012-08-05: 1000 mL via INTRAVENOUS

## 2012-08-05 NOTE — ED Notes (Signed)
CRITICAL VALUE ALERT  Critical value received:  Blood sugar 38  Date of notification:  08/05/2012  Time of notification:    Critical value read back:yes  Nurse who received alert:  Domenica Reamer RN  MD notified (1st page):  Dr Wyvonnia Dusky  Time of first page:  1250  MD notified (2nd page):  Time of second page:  Responding MD:  Dr Wyvonnia Dusky  Time MD responded:  [phone]

## 2012-08-05 NOTE — ED Notes (Signed)
Patient ambulated in hall with walker, gait steady with walker. Patient tolerated well. Dr Karle Starch aware.

## 2012-08-05 NOTE — ED Provider Notes (Signed)
History  This chart was scribed for Charles B. Karle Starch, MD by Mikel Cella, ED Scribe. This patient was seen in room APA06/APA06 and the patient's care was started at 1324.  CSN: [id number]  Arrival date & time 08/05/12  1237   First MD Initiated Contact with Patient 08/05/12 1324      Chief Complaint  Patient presents with  . Extremity Weakness     The history is provided by the patient. No language interpreter was used.    HPI Comments: [name] is a 79 y.o. female brought in by ambulance, who presents to the Emergency Department complaining of a fall that occurred 1 hours PTA. Pt states she was in the restroom at a restaurant when she slipped and fell. She is unsure of any LOC or head trauma. Pts daughter states she has a h/o muscle spasms in her bilateral legs. Pt denies any pain currently. She states her legs feel week. Pt had back surgery 5/1. Pts daughter states she has been doing well and regaining strength since discharge. Pt denies any dysuria, frequency, bladder incontinence, bowel incontinence, nausea, emesis and fever as associated symptoms.    Past Medical History  Diagnosis Date  . Diabetes mellitus   . Arthritis   . Hyperlipidemia   . Mental disorder   . Chronic kidney disease     renal stones, one episode of Lithotripsy  . GERD (gastroesophageal reflux disease)   . Skin cancer     face- melanoma, 2012- surg. excision    . Breast cancer, left     er/pr +  . Hypertension     followed by [facility]. relative to  urology study grp.  Doesn't report ever having a stress test   . Complication of anesthesia   . PONV (postoperative nausea and vomiting)   . Cataract     no surgery as yet,starting of 3 years  . Seasonal allergies     Past Surgical History  Procedure Laterality Date  . Cholecystectomy    . Abdominal hysterectomy    . Skin biopsy    . Shoulder surgery    . Tubal ligation    . Back surgery      x4 back surgery to include rods & screws     . Fracture surgery      1975- ORIF- L ankle   . Breast lumpectomy with needle localization and axillary sentinel lymph node bx  03/12/2012    Procedure: BREAST LUMPECTOMY WITH NEEDLE LOCALIZATION AND AXILLARY SENTINEL LYMPH NODE BX;  Surgeon: Merrie Roof, MD;  Location: [place];  Service: General;  Laterality: Left;  . Re-excision of breast lumpectomy  03/22/2012    Procedure: RE-EXCISION OF BREAST LUMPECTOMY;  Surgeon: Merrie Roof, MD;  Location: [place];  Service: General;  Laterality: Left;  re-excision left breast lumpectomy cavity  ER+, PR+  . Rotator cuff repair Bilateral   . Breast surgery      lumpectomy x2    Family History  Problem Relation Age of Onset  . Heart disease Father     History  Substance Use Topics  . Smoking status: Never Smoker   . Smokeless tobacco: Never Used  . Alcohol Use: No    OB History   Grav Para Term Preterm Abortions TAB SAB Ect Mult Living   2 2 2       2       Review of Systems  A complete 10 system review of  systems was obtained and all systems are negative except as noted in the HPI and PMH.    Allergies  Demerol and Other  Home Medications   Current Outpatient Rx  Name  Route  Sig  Dispense  Refill  . amLODipine (NORVASC) 10 MG tablet   Oral   Take 10 mg by mouth every morning.          [name] Kitchen anastrozole (ARIMIDEX) 1 MG tablet   Oral   Take 1 mg by mouth daily with breakfast.         . aspirin EC 81 MG tablet   Oral   Take 81 mg by mouth daily.          [name] Kitchen atorvastatin (LIPITOR) 10 MG tablet   Oral   Take 10 mg by mouth at bedtime.         . benazepril (LOTENSIN) 20 MG tablet   Oral   Take 20 mg by mouth every morning.          [name] Kitchen CALCIUM-VITAMIN D PO   Oral   Take 1 tablet by mouth daily. 1200 mg/500 mg         . cyclobenzaprine (FLEXERIL) 5 MG tablet   Oral   Take 5 mg by mouth every 8 (eight) hours as needed for muscle spasms.         [name] Kitchen docusate sodium (COLACE) 100 MG  capsule   Oral   Take 100 mg by mouth 2 (two) times daily as needed for constipation.          . DULoxetine (CYMBALTA) 60 MG capsule   Oral   Take 60 mg by mouth at bedtime.          [name] Kitchen EPINEPHrine (EPIPEN 2-PAK) 0.3 mg/0.3 mL DEVI   Intramuscular   Inject 0.3 mg into the muscle once as needed. For anaphylaxis         . gabapentin (NEURONTIN) 300 MG capsule   Oral   Take 300 mg by mouth 3 (three) times daily.         . hydrALAZINE (APRESOLINE) 25 MG tablet   Oral   Take 25 mg by mouth 3 (three) times daily.          [name] Kitchen HYDROcodone-acetaminophen (NORCO) 10-325 MG per tablet   Oral   Take 1 tablet by mouth every 4 (four) hours as needed for pain.         [name] Kitchen insulin glargine (LANTUS SOLOSTAR) 100 UNIT/ML injection   Subcutaneous   Inject 50 Units into the skin every morning.          . metoprolol succinate (TOPROL-XL) 100 MG 24 hr tablet   Oral   Take 100 mg by mouth daily. Take with or immediately following a meal.         . Naphazoline-Pheniramine (OPCON-A OP)   Both Eyes   Place 1 drop into both eyes daily as needed. For dry eyes         . omeprazole (PRILOSEC) 20 MG capsule   Oral   Take 20 mg by mouth at bedtime.          [name] Kitchen oxyCODONE (OXY IR/ROXICODONE) 5 MG immediate release tablet   Oral   Take 10 mg by mouth every 3 (three) hours as needed for pain.          . sitaGLIPtin (JANUVIA) 100 MG tablet   Oral   Take 100 mg by mouth every morning.  Triage Vitals: BP 114/48  Pulse 73  Temp(Src) 98 F (36.7 C) (Oral)  Resp 18  SpO2 97%  Physical Exam  Nursing note and vitals reviewed. Constitutional: She is oriented to person, place, and time. She appears well-developed and well-nourished.  HENT:  Head: Normocephalic and atraumatic.  Eyes: EOM are normal. Pupils are equal, round, and reactive to light.  Neck: Normal range of motion. Neck supple.  Cardiovascular: Normal rate, regular rhythm, normal heart sounds and intact distal  pulses.   Pulmonary/Chest: Effort normal and breath sounds normal.  Abdominal: Bowel sounds are normal. She exhibits no distension. There is no tenderness.  Musculoskeletal: Normal range of motion. She exhibits no edema and no tenderness.  Neurological: She is alert and oriented to person, place, and time. She has normal reflexes. No cranial nerve deficit or sensory deficit.  Lower extremity strength 4/5 proximally, 5/5 distally   Skin: Skin is warm and dry. No rash noted.  Psychiatric: She has a normal mood and affect.    ED Course  Procedures (including critical care time)  DIAGNOSTIC STUDIES: Oxygen Saturation is 97% on room air, normal by my interpretation.    COORDINATION OF CARE:  1:38 PM-Discussed treatment plan which includes glucose, CBC, BMP and UA with pt at bedside and pt agreed to plan.    Labs Reviewed  GLUCOSE, CAPILLARY - Abnormal; Notable for the following:    Glucose-Capillary 38 (*)    All other components within normal limits  GLUCOSE, CAPILLARY - Abnormal; Notable for the following:    Glucose-Capillary 170 (*)    All other components within normal limits  CBC - Abnormal; Notable for the following:    RBC 3.56 (*)    Hemoglobin 9.4 (*)    HCT 31.1 (*)    All other components within normal limits  BASIC METABOLIC PANEL - Abnormal; Notable for the following:    Glucose, Bld 39 (*)    BUN 28 (*)    Creatinine, Ser 1.19 (*)    GFR calc non Af Amer 43 (*)    GFR calc Af Amer 50 (*)    All other components within normal limits  URINALYSIS, ROUTINE W REFLEX MICROSCOPIC - Abnormal; Notable for the following:    Ketones, ur TRACE (*)    Protein, ur TRACE (*)    Leukocytes, UA SMALL (*)    All other components within normal limits  URINE MICROSCOPIC-ADD ON - Abnormal; Notable for the following:    Squamous Epithelial / LPF MANY (*)    Bacteria, UA FEW (*)    All other components within normal limits  URINE CULTURE  GLUCOSE, CAPILLARY   No results  found.   No diagnosis found.    MDM  Pt with recent back surgery has had weak legs since then, daughter states she is actually getting much better and was able to get into the SUV to see Dr. Luan Pulling this morning for the first time. Fall of unknown etiology, weakness vs hypoglycemia. Denies any pain related to the fall. Also recently admitted for dehydration/renal failure. Given D50, OJ, will check labs. No evidence of bony injury necessitating imaging at this point.    5:24 PM Pt feeling better. No focal neuro deficits, her weakness is multifactorial related to recent spine surgery as well as hypoglycemia likely stemming from NPO status prior to PCP appointment this AM. She is able to walk with a walker here at about her baseline. Cr is slightly above baseline, but not as high  as recent admission. Given IVF here and encouraged to drink adequate fluids at home as well. She is ready for discharge.    I personally performed the services described in this documentation, which was scribed in my presence. The recorded information has been reviewed and is accurate.     Charles B. Karle Starch, MD 08/05/12 1726

## 2012-08-05 NOTE — ED Notes (Signed)
Patient brought in via EMS. Patient fell at [place] when ambulating to bathroom. Per EMS patient had back surgery on May 1st. Patient reports falling a week ago and being checked at hospital. Patient denies pain but reports weakness. Patient's blood sugar 57 per EMS.

## 2012-08-05 NOTE — ED Notes (Signed)
Dr Wyvonnia Dusky made aware of patient's blood sugar-patient alert and oriented-denies any pain. Approval given to have patient drink orange juice.

## 2012-08-06 ENCOUNTER — Ambulatory Visit (INDEPENDENT_AMBULATORY_CARE_PROVIDER_SITE_OTHER): Payer: Medicare Other | Admitting: General Surgery

## 2012-08-06 LAB — GLUCOSE, CAPILLARY: Glucose-Capillary: 114 mg/dL — ABNORMAL HIGH (ref 70–99)

## 2012-08-07 LAB — URINE CULTURE

## 2012-08-08 ENCOUNTER — Telehealth (HOSPITAL_COMMUNITY): Payer: Self-pay | Admitting: Emergency Medicine

## 2012-08-08 NOTE — ED Notes (Signed)
Post ED Visit - Positive Culture Follow-up: Successful Patient Follow-Up  Culture assessed and recommendations reviewed by: [x]  Wes [place], Pharm.D., BCPS []  Heide Guile, Pharm.D., BCPS []  Alycia Rossetti, Pharm.D., BCPS []  [place], [place].D., BCPS, AAHIVP []  Legrand Como, Pharm.D., BCPS, AAHIVP  Positive urine culture  Changes discussed with ED provider: Delos Haring Recommendation ; Perform symptom check- if asymptotic-no treatment needed. If fever,ams,dysuria-treat with keflex 500 mg BID x 7 days  Contacted patient: no symptoms at this time. Patient states she do not feel like she need any abx at this time. date 08/08/2012 time 1646  Varney Baas 08/08/2012, 4:42 PM

## 2012-08-08 NOTE — ED Notes (Addendum)
Patient called back ,now she wants abx called to [place].rx called  By Vivien Rota PFM

## 2012-08-08 NOTE — Progress Notes (Signed)
  ED Antimicrobial Stewardship Positive Culture Follow Up   KARDI LESESNE is an 79 y.o. female who presented to [facility] on 08/05/2012 with a chief complaint of fall and leg weakness.  Chief Complaint  Patient presents with  . Extremity Weakness    Recent Results (from the past 720 hour(s))  MRSA PCR SCREENING     Status: Abnormal   Collection Time    07/24/12  4:11 PM      Result Value Range Status   MRSA by PCR POSITIVE (*) NEGATIVE Final   Comment:            The GeneXpert MRSA Assay (FDA     approved for NASAL specimens     only), is one component of a     comprehensive MRSA colonization     surveillance program. It is not     intended to diagnose MRSA     infection nor to guide or     monitor treatment for     MRSA infections.     RESULT CALLED TO, READ BACK BY AND VERIFIED WITH:     RN C.MATTHAIS AT 2030 BY L.PITT 07/24/12  URINE CULTURE     Status: None   Collection Time    08/05/12  3:35 PM      Result Value Range Status   Specimen Description URINE, CLEAN CATCH   Final   Special Requests NONE   Final   Culture  Setup Time 08/06/2012 02:46   Final   Colony Count 50,000 COLONIES/ML   Final   Culture ESCHERICHIA COLI   Final   Report Status 08/07/2012 FINAL   Final   Organism ID, Bacteria ESCHERICHIA COLI   Final    []  Treated with , organism resistant to prescribed antimicrobial [x]  Patient discharged originally without antimicrobial agent and treatment is now indicated  Recommendation: Perform symptom check. If patient is asymptomatic, no treatment is indicated. If patient is having UTI symptoms (altered mental status, fever, dysuria, etc), see prescription below.   New antibiotic prescription: Keflex 500mg  PO BID x 7 days  ED Provider: Delos Haring, PA-C   Earleen Newport 08/08/2012, 3:16 PM Infectious Diseases Pharmacist Phone# [phone]

## 2012-08-15 ENCOUNTER — Inpatient Hospital Stay
Admission: RE | Admit: 2012-08-15 | Discharge: 2012-09-20 | Disposition: A | Discharge status: Home / Self Care | Payer: Medicare Other | Source: Ambulatory Visit | Attending: Pulmonary Disease | Admitting: Pulmonary Disease

## 2012-08-16 LAB — GLUCOSE, CAPILLARY
Comment 2: 219661
Glucose-Capillary: 53 mg/dL — ABNORMAL LOW (ref 70–99)
Glucose-Capillary: 308 mg/dL — ABNORMAL HIGH (ref 70–99)
Glucose-Capillary: 319 mg/dL — ABNORMAL HIGH (ref 70–99)

## 2012-08-17 LAB — GLUCOSE, CAPILLARY
Glucose-Capillary: 120 mg/dL — ABNORMAL HIGH (ref 70–99)
Glucose-Capillary: 255 mg/dL — ABNORMAL HIGH (ref 70–99)

## 2012-08-18 LAB — GLUCOSE, CAPILLARY
Glucose-Capillary: 104 mg/dL — ABNORMAL HIGH (ref 70–99)
Glucose-Capillary: 203 mg/dL — ABNORMAL HIGH (ref 70–99)

## 2012-08-18 NOTE — Consult Note (Signed)
NAMELOVENA, CUN              ACCOUNT NO.:  [id number]  MEDICAL RECORD NO.:  [id number]  LOCATION:  S106                          FACILITY:  [facility]  PHYSICIAN:  Carliyah Cotterman L. Luan Pulling, M.D.DATE OF BIRTH:  08-14-1933  DATE OF CONSULTATION: DATE OF DISCHARGE:                                CONSULTATION   REASON FOR ADMISSION:  Failure to thrive.  HISTORY OF PRESENT ILLNESS:  Ms. Dike is a 79 year old, who had been having increasing problems with her back for several months.  She underwent surgery for L2-L3 fusion, revision of L3-S1, C12-L1 microdiskectomy, and initially did well, and had increasing problems with pain and inability to ambulate.  She was back in the hospital, treated there, and then eventually was allowed to come home, but was noted to have increasing problems with inability to walk, some confusion, and had physical therapy at home and felt that she needed rehabilitation, and she was admitted to the [facility] for that.  She says she feels somewhat better.  She is still weak.  She is still having some abdominal discomfort.  PAST MEDICAL HISTORY:  Positive for diabetes, arthritis, hyperlipidemia, mild chronic renal failure, breast cancer on the left, hypertension, vitamin B12 deficiency.  PAST SURGICAL HISTORY:  Surgically, she has had a cholecystectomy, hysterectomy, shoulder surgery, tubal ligation, back surgery 4 times, surgery for fracture of her left ankle, which was an open reduction and internal fixation, breast lumpectomy with needle localization, axillary sentinel lymph node biopsy, rotator cuff repair.  FAMILY HISTORY:  Her father had heart disease.  SOCIAL HISTORY:  She does not smoke and never has.  She does not use any alcohol.  She is married and lives at home with her husband.  REVIEW OF SYSTEMS:  Except as mentioned is essentially negative.  She has had some confusion that seems to have been related to her vitamin B12  deficiency.  PHYSICAL EXAMINATION:  GENERAL:  Shows that she is awake and alert.  She looks more comfortable when I saw her in my office. HEENT:  Pupils are reactive.  Nose and throat are clear.  Mucous membranes are moist. NECK:  Supple without masses. CHEST:  Clear without wheezes. HEART:  Regular without murmur, gallop, or rub. ABDOMEN:  Soft. EXTREMITIES:  Show no clubbing, cyanosis, or edema. CENTRAL NERVOUS SYSTEM:  Grossly intact.  ASSESSMENT:  She has inability to ambulate successfully.  She has multiple other medical problems, and she is being placed for rehabilitation.  She has had back surgery recently.     Octavis Sheeler L. Luan Pulling, M.D.     ELH/MEDQ  D:  08/18/2012  T:  08/18/2012  Job:  [id number]

## 2012-08-19 LAB — GLUCOSE, CAPILLARY
Glucose-Capillary: 77 mg/dL (ref 70–99)
Glucose-Capillary: 168 mg/dL — ABNORMAL HIGH (ref 70–99)
Glucose-Capillary: 93 mg/dL (ref 70–99)

## 2012-08-21 LAB — GLUCOSE, CAPILLARY
Glucose-Capillary: 71 mg/dL (ref 70–99)
Glucose-Capillary: 179 mg/dL — ABNORMAL HIGH (ref 70–99)
Glucose-Capillary: 205 mg/dL — ABNORMAL HIGH (ref 70–99)

## 2012-08-23 LAB — GLUCOSE, CAPILLARY
Glucose-Capillary: 240 mg/dL — ABNORMAL HIGH (ref 70–99)
Glucose-Capillary: 123 mg/dL — ABNORMAL HIGH (ref 70–99)
Glucose-Capillary: 365 mg/dL — ABNORMAL HIGH (ref 70–99)

## 2012-08-24 LAB — GLUCOSE, CAPILLARY
Glucose-Capillary: 208 mg/dL — ABNORMAL HIGH (ref 70–99)
Glucose-Capillary: 217 mg/dL — ABNORMAL HIGH (ref 70–99)

## 2012-08-25 LAB — GLUCOSE, CAPILLARY

## 2012-08-26 ENCOUNTER — Ambulatory Visit (INDEPENDENT_AMBULATORY_CARE_PROVIDER_SITE_OTHER): Payer: Medicare Other | Admitting: General Surgery

## 2012-08-26 LAB — GLUCOSE, CAPILLARY
Glucose-Capillary: 274 mg/dL — ABNORMAL HIGH (ref 70–99)
Glucose-Capillary: 112 mg/dL — ABNORMAL HIGH (ref 70–99)

## 2012-08-27 LAB — GLUCOSE, CAPILLARY: Glucose-Capillary: 107 mg/dL — ABNORMAL HIGH (ref 70–99)

## 2012-08-28 LAB — GLUCOSE, CAPILLARY
Glucose-Capillary: 212 mg/dL — ABNORMAL HIGH (ref 70–99)
Glucose-Capillary: 105 mg/dL — ABNORMAL HIGH (ref 70–99)

## 2012-08-29 LAB — GLUCOSE, CAPILLARY
Glucose-Capillary: 172 mg/dL — ABNORMAL HIGH (ref 70–99)
Glucose-Capillary: 139 mg/dL — ABNORMAL HIGH (ref 70–99)

## 2012-08-30 LAB — GLUCOSE, CAPILLARY: Glucose-Capillary: 201 mg/dL — ABNORMAL HIGH (ref 70–99)

## 2012-08-31 LAB — GLUCOSE, CAPILLARY
Glucose-Capillary: 149 mg/dL — ABNORMAL HIGH (ref 70–99)
Glucose-Capillary: 262 mg/dL — ABNORMAL HIGH (ref 70–99)

## 2012-09-01 LAB — GLUCOSE, CAPILLARY
Glucose-Capillary: 235 mg/dL — ABNORMAL HIGH (ref 70–99)
Glucose-Capillary: 163 mg/dL — ABNORMAL HIGH (ref 70–99)
Glucose-Capillary: 184 mg/dL — ABNORMAL HIGH (ref 70–99)

## 2012-09-02 LAB — GLUCOSE, CAPILLARY
Glucose-Capillary: 179 mg/dL — ABNORMAL HIGH (ref 70–99)
Glucose-Capillary: 330 mg/dL — ABNORMAL HIGH (ref 70–99)

## 2012-09-03 ENCOUNTER — Ambulatory Visit (INDEPENDENT_AMBULATORY_CARE_PROVIDER_SITE_OTHER): Payer: Medicare Other | Admitting: General Surgery

## 2012-09-03 LAB — GLUCOSE, CAPILLARY
Glucose-Capillary: 162 mg/dL — ABNORMAL HIGH (ref 70–99)
Glucose-Capillary: 47 mg/dL — ABNORMAL LOW (ref 70–99)
Glucose-Capillary: 245 mg/dL — ABNORMAL HIGH (ref 70–99)

## 2012-09-04 LAB — GLUCOSE, CAPILLARY
Glucose-Capillary: 350 mg/dL — ABNORMAL HIGH (ref 70–99)
Glucose-Capillary: 127 mg/dL — ABNORMAL HIGH (ref 70–99)
Glucose-Capillary: 176 mg/dL — ABNORMAL HIGH (ref 70–99)

## 2012-09-05 LAB — GLUCOSE, CAPILLARY

## 2012-09-06 LAB — GLUCOSE, CAPILLARY
Glucose-Capillary: 154 mg/dL — ABNORMAL HIGH (ref 70–99)
Glucose-Capillary: 331 mg/dL — ABNORMAL HIGH (ref 70–99)

## 2012-09-07 LAB — GLUCOSE, CAPILLARY
Glucose-Capillary: 134 mg/dL — ABNORMAL HIGH (ref 70–99)
Glucose-Capillary: 159 mg/dL — ABNORMAL HIGH (ref 70–99)
Glucose-Capillary: 185 mg/dL — ABNORMAL HIGH (ref 70–99)

## 2012-09-08 LAB — GLUCOSE, CAPILLARY: Glucose-Capillary: 277 mg/dL — ABNORMAL HIGH (ref 70–99)

## 2012-09-09 LAB — GLUCOSE, CAPILLARY
Glucose-Capillary: 322 mg/dL — ABNORMAL HIGH (ref 70–99)
Glucose-Capillary: 56 mg/dL — ABNORMAL LOW (ref 70–99)

## 2012-09-09 NOTE — Progress Notes (Signed)
She says she feels better. She has much less pain but is still having difficulty with ambulation. She is participating with physical therapy et Ronney Asters.  She is awake and alert. She is wearing a brace around her back. Her chest is clear. Her heart is regular.  She has had recent surgery on her back and has difficulty with ambulation after that. She seems to be improving. She has diabetes which is doing better. She has vitamin B 12 deficiency and has had some confusion related to that.  No change in treatments she is improving

## 2012-09-10 LAB — GLUCOSE, CAPILLARY
Glucose-Capillary: 208 mg/dL — ABNORMAL HIGH (ref 70–99)
Glucose-Capillary: 182 mg/dL — ABNORMAL HIGH (ref 70–99)
Glucose-Capillary: 221 mg/dL — ABNORMAL HIGH (ref 70–99)

## 2012-09-11 LAB — GLUCOSE, CAPILLARY
Glucose-Capillary: 123 mg/dL — ABNORMAL HIGH (ref 70–99)
Glucose-Capillary: 119 mg/dL — ABNORMAL HIGH (ref 70–99)
Glucose-Capillary: 258 mg/dL — ABNORMAL HIGH (ref 70–99)

## 2012-09-12 LAB — GLUCOSE, CAPILLARY
Glucose-Capillary: 151 mg/dL — ABNORMAL HIGH (ref 70–99)
Glucose-Capillary: 249 mg/dL — ABNORMAL HIGH (ref 70–99)

## 2012-09-13 LAB — GLUCOSE, CAPILLARY
Glucose-Capillary: 192 mg/dL — ABNORMAL HIGH (ref 70–99)
Glucose-Capillary: 191 mg/dL — ABNORMAL HIGH (ref 70–99)

## 2012-09-14 LAB — GLUCOSE, CAPILLARY
Glucose-Capillary: 161 mg/dL — ABNORMAL HIGH (ref 70–99)
Glucose-Capillary: 149 mg/dL — ABNORMAL HIGH (ref 70–99)

## 2012-09-15 LAB — GLUCOSE, CAPILLARY: Glucose-Capillary: 69 mg/dL — ABNORMAL LOW (ref 70–99)

## 2012-09-16 LAB — GLUCOSE, CAPILLARY
Glucose-Capillary: 255 mg/dL — ABNORMAL HIGH (ref 70–99)
Glucose-Capillary: 145 mg/dL — ABNORMAL HIGH (ref 70–99)

## 2012-09-17 LAB — GLUCOSE, CAPILLARY
Glucose-Capillary: 104 mg/dL — ABNORMAL HIGH (ref 70–99)
Glucose-Capillary: 144 mg/dL — ABNORMAL HIGH (ref 70–99)
Glucose-Capillary: 194 mg/dL — ABNORMAL HIGH (ref 70–99)
Glucose-Capillary: 159 mg/dL — ABNORMAL HIGH (ref 70–99)

## 2012-09-18 LAB — GLUCOSE, CAPILLARY: Glucose-Capillary: 196 mg/dL — ABNORMAL HIGH (ref 70–99)

## 2012-09-19 LAB — GLUCOSE, CAPILLARY
Glucose-Capillary: 99 mg/dL (ref 70–99)
Glucose-Capillary: 195 mg/dL — ABNORMAL HIGH (ref 70–99)
Glucose-Capillary: 231 mg/dL — ABNORMAL HIGH (ref 70–99)

## 2012-09-20 LAB — GLUCOSE, CAPILLARY: Glucose-Capillary: 104 mg/dL — ABNORMAL HIGH (ref 70–99)

## 2012-09-23 ENCOUNTER — Ambulatory Visit (HOSPITAL_COMMUNITY)
Admit: 2012-09-23 | Discharge: 2012-09-23 | Disposition: A | Discharge status: Home / Self Care | Payer: Medicare Other | Source: Ambulatory Visit | Attending: Pulmonary Disease | Admitting: Pulmonary Disease

## 2012-09-23 DIAGNOSIS — M545 Low back pain, unspecified: Secondary | ICD-10-CM | POA: Insufficient documentation

## 2012-09-23 DIAGNOSIS — IMO0001 Reserved for inherently not codable concepts without codable children: Secondary | ICD-10-CM | POA: Insufficient documentation

## 2012-09-23 DIAGNOSIS — I10 Essential (primary) hypertension: Secondary | ICD-10-CM | POA: Insufficient documentation

## 2012-09-23 DIAGNOSIS — R269 Unspecified abnormalities of gait and mobility: Secondary | ICD-10-CM | POA: Insufficient documentation

## 2012-09-23 DIAGNOSIS — E119 Type 2 diabetes mellitus without complications: Secondary | ICD-10-CM | POA: Insufficient documentation

## 2012-09-23 DIAGNOSIS — R29898 Other symptoms and signs involving the musculoskeletal system: Secondary | ICD-10-CM | POA: Insufficient documentation

## 2012-09-23 NOTE — Evaluation (Signed)
Physical Therapy Evaluation  Patient Details  Name: [name] MRN: [id number] Date of Birth: 11-29-1933  Today's Date: 09/23/2012 Time: [id number] PT Time Calculation (min): 43 min Charge:  evaluation             Visit#: 1 of 12  Re-eval: 10/23/12 Assessment Diagnosis: S/P back surgery Surgical Date: 06/27/12 Next MD Visit: 10/07/2012 Prior Therapy: SNF  Authorization:      Authorization Time Period:    Authorization Visit#:   of     Past Medical History:  Past Medical History  Diagnosis Date  . Diabetes mellitus   . Arthritis   . Hyperlipidemia   . Mental disorder   . Chronic kidney disease     renal stones, one episode of Lithotripsy  . GERD (gastroesophageal reflux disease)   . Skin cancer     face- melanoma, 2012- surg. excision    . Breast cancer, left     er/pr +  . Hypertension     followed by [facility]. relative to  urology study grp.  Doesn't report ever having a stress test   . Complication of anesthesia   . PONV (postoperative nausea and vomiting)   . Cataract     no surgery as yet,starting of 3 years  . Seasonal allergies    Past Surgical History:  Past Surgical History  Procedure Laterality Date  . Cholecystectomy    . Abdominal hysterectomy    . Skin biopsy    . Shoulder surgery    . Tubal ligation    . Back surgery      x4 back surgery to include rods & screws    . Fracture surgery      1975- ORIF- L ankle   . Breast lumpectomy with needle localization and axillary sentinel lymph node bx  03/12/2012    Procedure: BREAST LUMPECTOMY WITH NEEDLE LOCALIZATION AND AXILLARY SENTINEL LYMPH NODE BX;  Surgeon: Merrie Roof, MD;  Location: [facility];  Service: General;  Laterality: Left;  . Re-excision of breast lumpectomy  03/22/2012    Procedure: RE-EXCISION OF BREAST LUMPECTOMY;  Surgeon: Merrie Roof, MD;  Location: [place] [facility];  Service: General;  Laterality: Left;  re-excision left breast lumpectomy cavity  ER+, PR+  . Rotator  cuff repair Bilateral   . Breast surgery      lumpectomy x2    Subjective Symptoms/Limitations Symptoms: Ms. Speegle states that she had surgery May the fiirst for her back;  she went home but was falling several times a day therefore she was transferred to the [facility].  She stayed at the [facility] for five weeks and was discharged on 09/20/2012.  She currently is using a back brace and has been using a walker. She states that she was not using any assistive device until last fall when her back was bothering her much.  She had to delay the back operation secondary to having to have breast surgery.  Pertinent History: breast surgery for cancer in January.  Pt has had 3 previous back surgeries. How long can you sit comfortably?: Pt states that she is able to sit upright for an hour. How long can you stand comfortably?: The longest she is able to stand for has been 30 minutes. How long can you walk comfortably?: The longest she has walked for with the walker is 20 minutes.   She is starting to walk a little without the walker she is furniture walking without the walker.  Patient Stated Goals: to walk without the walker. Pain Assessment Currently in Pain?: No/denies (Highest pain level in the last week 7-8/10)  Precautions/Restrictions  Precautions Precautions: None  Balance Screening Balance Screen Has the patient fallen in the past 6 months: Yes How many times?:  (no falls since she has been at the [facility])  Prior Functioning  Prior Function Driving: Yes Vocation: Retired Leisure: Hobbies-yes (Comment) Comments:  (yard, get out to shop and eat.)  Cognition/Observation Cognition Overall Cognitive Status: Within Functional Limits for tasks assessed  Sensation/Coordination/Flexibility/Functional Tests Functional Tests Functional Tests: Back oswestry  27/50  Assessment RLE Strength Right Hip Flexion: 3+/5 Right Hip Extension: 5/5 Right Hip ABduction: 5/5 Right Hip  ADduction:  (4-) Right Knee Flexion: 5/5 Right Ankle Dorsiflexion: 3+/5 LLE Strength Left Hip Flexion: 3/5 Left Hip Extension: 3+/5 Left Hip ABduction: 4/5 Left Hip ADduction: 3-/5 Left Knee Flexion: 5/5 Left Knee Extension: 3+/5 Left Ankle Dorsiflexion: 3+/5  Exercise/Treatments Mobility/Balance  Posture/Postural Control Posture/Postural Control: Postural limitations Postural Limitations: pt forward bent with standing and walking activities    Seated Other Seated Lumbar Exercises: ab set; IR/ER of hip x 10   Physical Therapy Assessment and Plan PT Assessment and Plan Clinical Impression Statement: Pt s/p back surgery 06/27/2012.  PT referred to physcial therapy to return pt to previous functional level.  Pt demonstrates decreased strength of core and LE, decreased balance.  Pt will benefit from skilled therapy to advance gt, improve strength and adviance functional level. Pt will benefit from skilled therapeutic intervention in order to improve on the following deficits: Decreased activity tolerance;Decreased balance;Difficulty walking;Decreased strength;Pain Rehab Potential: Good PT Frequency: Min 3X/week PT Duration: 4 weeks PT Plan: Begin gt with cane; standing stab exercises for heel lift, minisquat; education in proper lifting (for light items out of pantry) education in proper posturen;begin supine core ex progress to prone;     Goals Home Exercise Program Pt/caregiver will Perform Home Exercise Program: For increased strengthening PT Short Term Goals Time to Complete Short Term Goals: 2 weeks PT Short Term Goal 1: Pt to be ambulating with a cane inside the house PT Short Term Goal 2: Pt strength increased 1/2 grade to allow pt to do the above PT Short Term Goal 3: Pt to be ambulating with a cane x 34min PT Short Term Goal 4: Pt to be able to verbalize the importance of posture and body mechanics in back care PT Long Term Goals Time to Complete Long Term Goals: 4  weeks PT Long Term Goal 1: Pt to ambulate inside with no assistive device for over an hour to allow shopping PT Long Term Goal 2: Pt strength to increase one grade to allow the above Long Term Goal 3: Pt to be able to go up and down steps reciprocally Long Term Goal 4: Pt to be able to sit for two hours to enjoy a nice meal out PT Long Term Goal 5: Pt to be weaned from her back brace.  Problem List Patient Active Problem List   Diagnosis Date Noted  . Bilateral leg weakness 09/23/2012  . Acute renal failure 07/25/2012  . Dehydration 07/25/2012  . DM type 2, uncontrolled, with neuropathy 07/25/2012  . DM (diabetes mellitus), type 2, uncontrolled 07/25/2012  . Essential hypertension, benign 07/25/2012  . Generalized weakness 07/25/2012  . Hyperlipidemia   . Chronic kidney disease   . Skin cancer   . Hypertension   . GERD (gastroesophageal reflux disease)   . Breast cancer 02/19/2012  .  CAP (community acquired pneumonia) 04/19/2011  . Nausea and vomiting 04/19/2011  . HTN (hypertension) 04/19/2011  . DM type 2 (diabetes mellitus, type 2) 04/19/2011  . Arthritis 04/19/2011    PT - End of Session Activity Tolerance: Patient tolerated treatment well PT Plan of Care PT Home Exercise Plan: given  GP Functional Assessment Tool Used: [employer]-medicare Functional Limitation: Self care Self Care Current Status [id number]): At least 40 percent but less than 60 percent impaired, limited or restricted Self Care Goal Status [id number]): At least 1 percent but less than 20 percent impaired, limited or restricted  Bader Stubblefield,CINDY 09/23/2012, 10:48 AM  Physician Documentation Your signature is required to indicate approval of the treatment plan as stated above.  Please sign and either send electronically or make a copy of this report for your files and return this physician signed original.   Please mark one 1.__approve of plan  2. ___approve of plan with the following  conditions.   ______________________________                                                          _____________________ Physician Signature                                                                                                             Date

## 2012-09-25 ENCOUNTER — Ambulatory Visit (HOSPITAL_COMMUNITY)
Admission: RE | Admit: 2012-09-25 | Discharge: 2012-09-25 | Disposition: A | Discharge status: Home / Self Care | Payer: Medicare Other | Source: Ambulatory Visit | Attending: Pulmonary Disease | Admitting: Pulmonary Disease

## 2012-09-25 NOTE — Progress Notes (Signed)
Physical Therapy Treatment Patient Details  Name: [name] MRN: [id number] Date of Birth: Nov 13, 1933  Today's Date: 09/25/2012 Time: 1349-1430 PT Time Calculation (min): 41 min Charges: Therex x 28' [phone]) Gait x 8' [phone])  Visit#: 2 of 12  Re-eval: 10/23/12  Authorization: [employer]-medicare  Authorization Time Period:    Authorization Visit#: 2 of 10   Subjective: Symptoms/Limitations Symptoms: Pt reports HEP compliance, Pain Assessment Currently in Pain?: No/denies  Exercise/Treatments Standing Heel Raises: 10 reps;5 seconds;Limitations Heel Raises Limitations: Toe raises x 10 Functional Squats: 10 reps Other Standing Lumbar Exercises:   Seated Other Seated Lumbar Exercises: IR/ER of hip x 10 Supine Ab Set: 10 reps;5 seconds Bent Knee Raise: 10 reps Bridge: 10 reps;5 seconds Other Supine Lumbar Exercises: hook-lying adductor ball squeeze 10x5"  Physical Therapy Assessment and Plan PT Assessment and Plan Clinical Impression Statement: Progressed stabilization exercises with minimal difficulty after initial cueing and demo. Pt has most difficulty with bilateral side lying abduction secondary to weakness. Pt requires multimodal cueing to avoid external rotation with this exercise. Began gait training with SPC with significant gait deviations due to hip weakness. Deviations decrease with cueing for concentration. Pt is without complaint throughout session. Pt will benefit from skilled therapeutic intervention in order to improve on the following deficits: Decreased activity tolerance;Decreased balance;Difficulty walking;Decreased strength;Pain Rehab Potential: Good PT Frequency: Min 3X/week PT Duration: 4 weeks PT Plan: Continue to progress stabilization exercises and gait. Progress to prone when appropriate.      Problem List Patient Active Problem List   Diagnosis Date Noted  . Bilateral leg weakness 09/23/2012  . Acute renal failure 07/25/2012  .  Dehydration 07/25/2012  . DM type 2, uncontrolled, with neuropathy 07/25/2012  . DM (diabetes mellitus), type 2, uncontrolled 07/25/2012  . Essential hypertension, benign 07/25/2012  . Generalized weakness 07/25/2012  . Hyperlipidemia   . Chronic kidney disease   . Skin cancer   . Hypertension   . GERD (gastroesophageal reflux disease)   . Breast cancer 02/19/2012  . CAP (community acquired pneumonia) 04/19/2011  . Nausea and vomiting 04/19/2011  . HTN (hypertension) 04/19/2011  . DM type 2 (diabetes mellitus, type 2) 04/19/2011  . Arthritis 04/19/2011    PT - End of Session Activity Tolerance: Patient tolerated treatment well General Behavior During Therapy: [facility] for tasks assessed/performed  Rachelle Hora, PTA 09/25/2012, 2:59 PM

## 2012-09-27 ENCOUNTER — Ambulatory Visit (HOSPITAL_COMMUNITY)
Admission: RE | Admit: 2012-09-27 | Discharge: 2012-09-27 | Disposition: A | Discharge status: Home / Self Care | Payer: Medicare Other | Source: Ambulatory Visit | Attending: Pulmonary Disease | Admitting: Pulmonary Disease

## 2012-09-27 DIAGNOSIS — R269 Unspecified abnormalities of gait and mobility: Secondary | ICD-10-CM | POA: Insufficient documentation

## 2012-09-27 DIAGNOSIS — R29898 Other symptoms and signs involving the musculoskeletal system: Secondary | ICD-10-CM | POA: Insufficient documentation

## 2012-09-27 DIAGNOSIS — M545 Low back pain, unspecified: Secondary | ICD-10-CM | POA: Insufficient documentation

## 2012-09-27 DIAGNOSIS — I10 Essential (primary) hypertension: Secondary | ICD-10-CM | POA: Insufficient documentation

## 2012-09-27 DIAGNOSIS — IMO0001 Reserved for inherently not codable concepts without codable children: Secondary | ICD-10-CM | POA: Insufficient documentation

## 2012-09-27 DIAGNOSIS — E119 Type 2 diabetes mellitus without complications: Secondary | ICD-10-CM | POA: Insufficient documentation

## 2012-09-27 NOTE — Progress Notes (Signed)
Physical Therapy Treatment Patient Details  Name: [name] MRN: [id number] Date of Birth: 08/08/1933  Today's Date: 09/27/2012 Time: 1100-1145 PT Time Calculation (min): 45 min Charge there ex (3) 11:00-11:45 Visit#: 3 of 12  Re-eval: 10/23/12 Assessment Diagnosis: S/P back surgery Surgical Date: 06/27/12 Next MD Visit: Vertell Limber 8/11/2014Luan Pulling 10/14/2012 Prior Therapy: SNF  Authorization: [employer]-medicare     Authorization Visit#: 3 of 10   Subjective: Symptoms/Limitations Symptoms: Pt reports LBP 5/10 had to take pain meds prior therapy today.   Pain Assessment Currently in Pain?: Yes Pain Score: 5  Pain Location: Back Pain Orientation: Lower  Precautions/Restrictions  Precautions Precautions: None  Exercise/Treatments   Standing Heel Raises: 10 reps;5 seconds;Limitations Heel Raises Limitations: Toe raises x 10 Functional Squats: 10 reps Other Standing Lumbar Exercises: Extension 5x 10" Seated Sit to Stand: 10 reps Supine Ab Set: 10 reps Clam: 10 reps Bent Knee Raise: 10 reps Bridge: 10 reps Straight Leg Raise: Limitations Straight Leg Raises Limitations: extend knee,then return. Other Supine Lumbar Exercises: isometric adduction/abduction x 10 Sidelying Hip Abduction: 10 reps;Limitations Hip Abduction Limitations: AA  Physical Therapy Assessment and Plan PT Assessment and Plan Clinical Impression Statement: Pt needed mod assit with sit to stand exercise; Pt with visible shaking with new exercises as mm fatigue sat in.  Pt completed sidelying abduction with min assist.      Goals  progressing  Problem List Patient Active Problem List   Diagnosis Date Noted  . Bilateral leg weakness 09/23/2012  . Acute renal failure 07/25/2012  . Dehydration 07/25/2012  . DM type 2, uncontrolled, with neuropathy 07/25/2012  . DM (diabetes mellitus), type 2, uncontrolled 07/25/2012  . Essential hypertension, benign 07/25/2012  . Generalized weakness 07/25/2012   . Hyperlipidemia   . Chronic kidney disease   . Skin cancer   . Hypertension   . GERD (gastroesophageal reflux disease)   . Breast cancer 02/19/2012  . CAP (community acquired pneumonia) 04/19/2011  . Nausea and vomiting 04/19/2011  . HTN (hypertension) 04/19/2011  . DM type 2 (diabetes mellitus, type 2) 04/19/2011  . Arthritis 04/19/2011    PT - End of Session Activity Tolerance: Patient tolerated treatment well General Behavior During Therapy: [facility] for tasks assessed/performed  GP Functional Assessment Tool Used: [employer]-medicare  RUSSELL,CINDY 09/27/2012, 12:23 PM

## 2012-09-30 ENCOUNTER — Ambulatory Visit (HOSPITAL_COMMUNITY)
Admission: RE | Admit: 2012-09-30 | Discharge: 2012-09-30 | Disposition: A | Discharge status: Home / Self Care | Payer: Medicare Other | Source: Ambulatory Visit | Attending: Pulmonary Disease | Admitting: Pulmonary Disease

## 2012-09-30 NOTE — Progress Notes (Signed)
Physical Therapy Treatment Patient Details  Name: [name] MRN: [id number] Date of Birth: [birth year]  Today's Date: 09/30/2012 Time: 0835-0914 PT Time Calculation (min): 39 min Visit#: 4 of 12  Re-eval: 10/23/12 Authorization: [employer]-medicare  Authorization Visit#: 4 of 10  Charges:  therex 38'  Subjective: Symptoms/Limitations Symptoms: Pt states that she was sore after last session but now she feels much better. Pain Assessment Currently in Pain?: No/denies    Treatment began by Rachelle Hora, PTA Exercise/Treatments Standing Heel Raises: 10 reps;5 seconds;Limitations Heel Raises Limitations: Toe raises x 10 Functional Squats: 10 reps Scapular Retraction: 10 reps;Theraband Theraband Level (Scapular Retraction): Level 3 (Green) Row: 10 reps;Theraband Theraband Level (Row): Level 3 (Green) Shoulder Extension: 10 reps;Theraband Theraband Level (Shoulder Extension): Level 3 (Green) Supine Ab Set: 10 reps Clam: 10 reps Bent Knee Raise: 10 reps Bridge: 10 reps Straight Leg Raise: 10 reps;Limitations Straight Leg Raises Limitations: extend knee,then return. Other Supine Lumbar Exercises: isometric adduction x 10 Sidelying Clam: 10 reps;5 seconds Hip Abduction: 10 reps;Limitations Hip Abduction Limitations: AA      Physical Therapy Assessment and Plan PT Assessment and Plan PT Assessment:  Pt able to complete all exercises without pain; vc's to perform slowly, controlled with increased stabilization.  Pt requires AA with SL hip abduction due to substitution of hip flexor/quad.  PT Plan: Continue to progress stabilization exercises and gait. Progress to prone when appropriate.      Problem List Patient Active Problem List   Diagnosis Date Noted  . Bilateral leg weakness 09/23/2012  . Acute renal failure 07/25/2012  . Dehydration 07/25/2012  . DM type 2, uncontrolled, with neuropathy 07/25/2012  . DM (diabetes mellitus), type 2, uncontrolled 07/25/2012  .  Essential hypertension, benign 07/25/2012  . Generalized weakness 07/25/2012  . Hyperlipidemia   . Chronic kidney disease   . Skin cancer   . Hypertension   . GERD (gastroesophageal reflux disease)   . Breast cancer 02/19/2012  . CAP (community acquired pneumonia) 04/19/2011  . Nausea and vomiting 04/19/2011  . HTN (hypertension) 04/19/2011  . DM type 2 (diabetes mellitus, type 2) 04/19/2011  . Arthritis 04/19/2011    PT - End of Session Activity Tolerance: Patient tolerated treatment well General Behavior During Therapy: WFL for tasks assessed/performed   Teena Irani, PTA/CLT 09/30/2012, 9:04 AM

## 2012-10-02 ENCOUNTER — Inpatient Hospital Stay (HOSPITAL_COMMUNITY)
Admission: RE | Admit: 2012-10-02 | Discharge: 2012-10-02 | Disposition: A | Discharge status: Home / Self Care | Payer: Self-pay | Source: Ambulatory Visit | Attending: *Deleted | Admitting: *Deleted

## 2012-10-02 NOTE — Progress Notes (Signed)
Physical Therapy Treatment Patient Details  Name: [name] MRN: [id number] Date of Birth: 1933/11/09  Today's Date: 10/02/2012 Time: [id number] PT Time Calculation (min): 43 min  Visit#: 5 of 12  Re-eval: 10/23/12 Charges: Therex x 40' ([id number])  Authorization: [employer]-medicare  Authorization Visit#: 5 of 10   Subjective: Symptoms/Limitations Symptoms: Pt reports continued HEP compliance. Pt is walking with SPC at home. Pain Assessment Currently in Pain?: No/denies   Exercise/Treatments Standing Heel Raises: 10 reps;5 seconds;Limitations Heel Raises Limitations: Toe raises x 10 Functional Squats: 10 reps Scapular Retraction: 10 reps;Theraband Theraband Level (Scapular Retraction): Level 3 (Green) Row: 10 reps;Theraband Theraband Level (Row): Level 3 (Green) Shoulder Extension: 10 reps;Theraband Theraband Level (Shoulder Extension): Level 3 (Green) Supine Ab Set: 10 reps;5 seconds Bent Knee Raise: 10 reps Bridge: 10 reps Straight Leg Raise: 10 reps Isometric Hip Flexion: 10 reps;5 seconds Other Supine Lumbar Exercises: isometric adduction 10x5" Sidelying Clam: 10 reps;5 seconds Hip Abduction: 10 reps;Limitations Hip Abduction Limitations: AA Prone  Straight Leg Raise: 10 reps Other Prone Lumbar Exercises: Multifidus x10 with multimodal cueing  Physical Therapy Assessment and Plan PT Assessment and Plan Clinical Impression Statement: Pt completes majority of exercises well with minimal need for cueing. Pt requires multimodal cueing with side lying clams to isolate glute med. Began prone exercises to improve glute and multifidus strength. Pt appears to be progressing well over all.  PT Plan: Continue to progress stabilization exercises and gait. Progress to prone when appropriate.      Problem List Patient Active Problem List   Diagnosis Date Noted  . Bilateral leg weakness 09/23/2012  . Acute renal failure 07/25/2012  . Dehydration 07/25/2012  . DM type 2,  uncontrolled, with neuropathy 07/25/2012  . DM (diabetes mellitus), type 2, uncontrolled 07/25/2012  . Essential hypertension, benign 07/25/2012  . Generalized weakness 07/25/2012  . Hyperlipidemia   . Chronic kidney disease   . Skin cancer   . Hypertension   . GERD (gastroesophageal reflux disease)   . Breast cancer 02/19/2012  . CAP (community acquired pneumonia) 04/19/2011  . Nausea and vomiting 04/19/2011  . HTN (hypertension) 04/19/2011  . DM type 2 (diabetes mellitus, type 2) 04/19/2011  . Arthritis 04/19/2011    PT - End of Session Activity Tolerance: Patient tolerated treatment well General Behavior During Therapy: [facility] for tasks assessed/performed  Rachelle Hora, PTA 10/02/2012, 9:54 AM

## 2012-10-03 ENCOUNTER — Other Ambulatory Visit (HOSPITAL_COMMUNITY): Payer: Self-pay | Admitting: Hematology and Oncology

## 2012-10-03 DIAGNOSIS — C50912 Malignant neoplasm of unspecified site of left female breast: Secondary | ICD-10-CM

## 2012-10-04 ENCOUNTER — Ambulatory Visit (HOSPITAL_COMMUNITY)
Admission: RE | Admit: 2012-10-04 | Discharge: 2012-10-04 | Disposition: A | Discharge status: Home / Self Care | Payer: Medicare Other | Source: Ambulatory Visit

## 2012-10-04 DIAGNOSIS — R29898 Other symptoms and signs involving the musculoskeletal system: Secondary | ICD-10-CM

## 2012-10-04 NOTE — Progress Notes (Addendum)
Physical Therapy Re-evaluation/treatment  Patient Details  Name: [name] MRN: [id number] Date of Birth: [birth year]  Today's Date: 10/04/2012 Time: [id number] PT Time Calculation (min): 21 min Charge:  MMT [id number], TE [phone], Gait [phone]              Visit#: 6 of 12  Re-eval: 10/23/12 Assessment Diagnosis: S/P back surgery Surgical Date: 06/27/12 Next MD Visit: Vertell Limber 8/11/2014Luan Pulling 10/14/2012 Prior Therapy: SNF  Authorization: [employer]-medicare    Authorization Time Period:    Authorization Visit#: 6 of 10   Subjective Symptoms/Limitations Symptoms: IPt stated she had increased LBP and pain Rt knee this morning, pain decreased with movement.  Pt reported she has been walking with SPC at home indoors, hasnt attempted outdoors.   How long can you sit comfortably?: Pt states that she is able to sit upright for an hour. How long can you stand comfortably?: Able to stand for 30-45 minutes with UE assistance How long can you walk comfortably?: Able to walk for 30 minutes with walker and SPC. Pain Assessment Currently in Pain?: Yes Pain Score: 4  Pain Location: Back Pain Orientation: Lower  Precautions/Restrictions  Precautions Precautions: None  Assessment RLE Strength Right Hip Flexion:  (4-/5 was 3+/5) Right Hip Extension: 4/5 (was 5/5) Right Hip ABduction:  (was 5/5) Right Hip ADduction: 4/5 (was 4-/5) Right Knee Flexion:  (was 5/5) Right Knee Extension: 4/5 Right Ankle Dorsiflexion: 4/5 (was 3+/5) LLE Strength Left Hip Flexion: 3+/5 (was 3/5) Left Hip Extension: 4/5 (was 3+/5) Left Hip ABduction: 3-/5 (was 4/5) Left Hip ADduction: 3/5 (was 3-/5) Left Knee Flexion:  (was 5/5) Left Knee Extension: 4/5 (was 3+/5) Left Ankle Dorsiflexion: 4/5 (was 3+/5)  Exercise/Treatments Standing Other Standing Lumbar Exercises: Gait training with SPC with cueing for posture, sequencing with SPC and to equalize stride length.  Stair training step to pattern wtith SPC  steps in dept. Supine Ab Set: 10 reps;5 seconds Bridge: 10 reps;5 seconds Straight Leg Raise: 10 reps;Limitations Straight Leg Raises Limitations: floating SLR Sidelying Clam: 10 reps;5 seconds Hip Abduction: 10 reps;Limitations Hip Abduction Limitations: AA Other Sidelying Lumbar Exercises: adduction 10x bilateral LE Prone  Straight Leg Raise: 10 reps  Physical Therapy Assessment and Plan PT Assessment and Plan Clinical Impression Statement: Re-eval complete prior MD apt, Mrs. Tasia Catchings has had 6 OPPt sessions over 2 weeks with the following findings:  Pt independent wtih HEP twice daily.  Pt able to verbalize importance of posture and proper body mechanics but continues to require cueing to reduce forward flexion and improve posture/ body mechanics with exercises.  Pt reports ambulating indoors with her SPC with ability to walk for 30 minutes with cane, has not attempted walking outdoors with least AD.  Pt continues to ambulate outdoors with RW with back brace on.  Strength is progressing well, pt given HEP worksheet for strengthening weak musculature, pt educated importance of proper form wtih exercises for correct muscule activation.  Gait training complete with SPC with cueing for posture, equalized stride length and proper sequencing.  Began stair training with cane with cueing for sequencing, pt able to verbalize proper technique though does required min cueing for proper technique.   PT Plan: Recommend continug OPPT to meet upmet goals.  Continue to progress stabilization exercises and gait. Progress to prone when appropriate.    Continue to see pt 3 times a week for four more weeks  Goals  progressing STG Pt to be ambulating with a cane inside the house  Met Pt  strength to increase 1/2 grade progressing Pt ambulating with a cane x 30 minutes progressing Pt able to verbalize importance of posture and body mechanics for back care progressing LTG Pt able to ambulate with no assistive  device for over an hour to allow shopping not met Pt strength to increase by 1 grade to allow the above progressing Pt to be able to go up and down stairs reciprocally progressing Pt to be able to sit for 2 hours to enjoy nice meal out progressing Pt to be weaned from her back brace not met   Problem List Patient Active Problem List   Diagnosis Date Noted  . Bilateral leg weakness 09/23/2012  . Acute renal failure 07/25/2012  . Dehydration 07/25/2012  . DM type 2, uncontrolled, with neuropathy 07/25/2012  . DM (diabetes mellitus), type 2, uncontrolled 07/25/2012  . Essential hypertension, benign 07/25/2012  . Generalized weakness 07/25/2012  . Hyperlipidemia   . Chronic kidney disease   . Skin cancer   . Hypertension   . GERD (gastroesophageal reflux disease)   . Breast cancer 02/19/2012  . CAP (community acquired pneumonia) 04/19/2011  . Nausea and vomiting 04/19/2011  . HTN (hypertension) 04/19/2011  . DM type 2 (diabetes mellitus, type 2) 04/19/2011  . Arthritis 04/19/2011    PT - End of Session Activity Tolerance: Patient tolerated treatment well General Behavior During Therapy: WFL for tasks assessed/performed PT Plan of Care PT Home Exercise Plan: see new HEP worksheet  GP Functional Assessment Tool Used: [employer]-medicare  Aldona Lento 10/04/2012, 1:21 PM  Your signature is required to indicate approval of the treatment plan as stated above.  Please sign and return making a copy for your files.  You may hard copy or send electronically.  Please check one: ___1.  Approve of this plan  ___2.  Approve of this plan with the following changes.   ____________________________                             _____________ Physician                                                                      Date

## 2012-10-07 ENCOUNTER — Ambulatory Visit (HOSPITAL_COMMUNITY)
Admission: RE | Admit: 2012-10-07 | Discharge: 2012-10-07 | Disposition: A | Discharge status: Home / Self Care | Payer: Medicare Other | Source: Ambulatory Visit | Attending: Pulmonary Disease | Admitting: Pulmonary Disease

## 2012-10-07 NOTE — Progress Notes (Signed)
Physical Therapy Treatment Patient Details  Name: [name] MRN: [id number] Date of Birth: 1933-11-09  Today's Date: 10/07/2012 Time: [id number] PT Time Calculation (min): 43 min  Visit#: 7 of 12  Re-eval: 10/23/12 Charges: Therex x 40' ([id number])  Authorization: [employer]-medicare  Authorization Visit#: 7 of 10   Subjective: Symptoms/Limitations Symptoms: Pt states that MD was pleased with her progress. Pain Assessment Currently in Pain?: Yes Pain Score: 3  Pain Location: Back Pain Orientation: Lower   Exercise/Treatments Standing Heel Raises: 15 reps Heel Raises Limitations: Toe raises x 15 Functional Squats: 15 reps Scapular Retraction: 10 reps;Theraband Theraband Level (Scapular Retraction): Level 3 (Green) Row: 10 reps;Theraband Theraband Level (Row): Level 3 (Green) Shoulder Extension: 10 reps;Theraband Theraband Level (Shoulder Extension): Level 3 (Green) Other Standing Lumbar Exercises: Gait training with SPC with cueing for posture, sequencing with SPC and to equalize stride length.  Stair training step to pattern wtith SPC steps in dept. Seated Sit to Stand: 10 reps;Limitations Sit to Stand Limitations: no UE assistance Supine Bridge: 10 reps;5 seconds Straight Leg Raise: 10 reps Sidelying Clam: 10 reps;5 seconds (with multimodalcueing to isolate glute med) Hip Abduction: 10 reps Hip Abduction Limitations: Active Other Sidelying Lumbar Exercises: adduction 10x bilateral LE Prone  Straight Leg Raise: 10 reps  Physical Therapy Assessment and Plan PT Assessment and Plan Clinical Impression Statement: Pt appears to be progressing well. Pt is most limited by core and lower extremity weakness. Pt is walking more often at home with [facility]. Steadiness and gait quality with SPC is improving. Hip abductor strength has improved as pt can now actively abduct hips in side lying bilaterally. PT Plan: Continue to progress stabilization exercises and gait.     Problem  List Patient Active Problem List   Diagnosis Date Noted  . Bilateral leg weakness 09/23/2012  . Acute renal failure 07/25/2012  . Dehydration 07/25/2012  . DM type 2, uncontrolled, with neuropathy 07/25/2012  . DM (diabetes mellitus), type 2, uncontrolled 07/25/2012  . Essential hypertension, benign 07/25/2012  . Generalized weakness 07/25/2012  . Hyperlipidemia   . Chronic kidney disease   . Skin cancer   . Hypertension   . GERD (gastroesophageal reflux disease)   . Breast cancer 02/19/2012  . CAP (community acquired pneumonia) 04/19/2011  . Nausea and vomiting 04/19/2011  . HTN (hypertension) 04/19/2011  . DM type 2 (diabetes mellitus, type 2) 04/19/2011  . Arthritis 04/19/2011    PT - End of Session Activity Tolerance: Patient tolerated treatment well General Behavior During Therapy: [facility] for tasks assessed/performed  Rachelle Hora, PTA 10/07/2012, 4:55 PM

## 2012-10-09 ENCOUNTER — Ambulatory Visit (HOSPITAL_COMMUNITY)
Admission: RE | Admit: 2012-10-09 | Discharge: 2012-10-09 | Disposition: A | Discharge status: Home / Self Care | Payer: Medicare Other | Source: Ambulatory Visit | Attending: Pulmonary Disease | Admitting: Pulmonary Disease

## 2012-10-09 NOTE — Progress Notes (Signed)
Physical Therapy Treatment Patient Details  Name: [name] MRN: [id number] Date of Birth: 1933/11/20  Today's Date: 10/09/2012 Time: [id number] PT Time Calculation (min): 51 min  Visit#: 8 of 12  Re-eval: 10/23/12 Charges: Gait x [id number]) Therex x 33' ([id number])  Authorization: [employer]-medicare  Authorization Visit#: 8 of 10   Subjective: Symptoms/Limitations Symptoms: Pt states that she had some discomfort when she woke up his morning but it has subsided. Pain Assessment Currently in Pain?: No/denies   Exercise/Treatments Aerobic Tread Mill: Gait training leg leng 84 x6' Standing Heel Raises: 20 reps Heel Raises Limitations: Toe raises x 20 Functional Squats: 15 reps Scapular Retraction: 10 reps;Theraband Theraband Level (Scapular Retraction): Level 3 (Green) Row: 10 reps;Theraband Theraband Level (Row): Level 3 (Green) Shoulder Extension: 10 reps;Theraband Theraband Level (Shoulder Extension): Level 3 (Green) Other Standing Lumbar Exercises: Gait training with SPC with cueing for posture, sequencing with SPC and to equalize stride length Supine Bridge: 15 reps Straight Leg Raise: 10 reps Other Supine Lumbar Exercises: Bilateral hip abduction with tband x10 Sidelying Clam: 10 reps;5 seconds Hip Abduction: 10 reps Hip Abduction Limitations: Active Other Sidelying Lumbar Exercises: adduction 10x bilateral LE Prone  Straight Leg Raise: 10 reps  Physical Therapy Assessment and Plan PT Assessment and Plan Clinical Impression Statement: Pt ambulates into therapy with SPC. Suggested that pt use walker for outdoor ambulation and long distances secondary to significant deviations when ambulating with SPC. Began gait training on treadmill to improve posture and equalize step length. Gait deviations appear to be caused from hip weakness, left greater than right. Emphasized hip strengthening exercises for HEP. PT Plan: Continue to progress stabilization exercises and  gait. Emphasize hip strengthening.    Problem List Patient Active Problem List   Diagnosis Date Noted  . Bilateral leg weakness 09/23/2012  . Acute renal failure 07/25/2012  . Dehydration 07/25/2012  . DM type 2, uncontrolled, with neuropathy 07/25/2012  . DM (diabetes mellitus), type 2, uncontrolled 07/25/2012  . Essential hypertension, benign 07/25/2012  . Generalized weakness 07/25/2012  . Hyperlipidemia   . Chronic kidney disease   . Skin cancer   . Hypertension   . GERD (gastroesophageal reflux disease)   . Breast cancer 02/19/2012  . CAP (community acquired pneumonia) 04/19/2011  . Nausea and vomiting 04/19/2011  . HTN (hypertension) 04/19/2011  . DM type 2 (diabetes mellitus, type 2) 04/19/2011  . Arthritis 04/19/2011    PT - End of Session Activity Tolerance: Patient tolerated treatment well General Behavior During Therapy: [facility] for tasks assessed/performed  Rachelle Hora, PTA  10/09/2012, 9:37 AM

## 2012-10-11 ENCOUNTER — Ambulatory Visit (HOSPITAL_COMMUNITY)
Admission: RE | Admit: 2012-10-11 | Discharge: 2012-10-11 | Disposition: A | Discharge status: Home / Self Care | Payer: Medicare Other | Source: Ambulatory Visit | Attending: Pulmonary Disease | Admitting: Pulmonary Disease

## 2012-10-11 DIAGNOSIS — R29898 Other symptoms and signs involving the musculoskeletal system: Secondary | ICD-10-CM

## 2012-10-11 NOTE — Progress Notes (Signed)
Physical Therapy Treatment Patient Details  Name: [name] MRN: [id number] Date of Birth: December 16, 1933  Today's Date: 10/11/2012 Time: [id number] PT Time Calculation (min): 42 min Charge: Gait 0900-0910, TE 0853-0900, [id number]  Visit#: 9 of 12  Re-eval: 10/23/12 Assessment Diagnosis: S/P back surgery Surgical Date: 06/27/12 Next MD Visit: Luan Pulling 10/14/2012 Prior Therapy: SNF  Authorization: [employer]-medicare  Authorization Time Period:    Authorization Visit#: 9 of 10   Subjective: Symptoms/Limitations Symptoms: Pt reportes discomfort when she woke up this morning, pain scale has subsided to a 4/10  Pt reports she has been ambulating with SPC around house. Pain Assessment Currently in Pain?: Yes Pain Score: 4  Pain Location: Back Pain Orientation: Lower  Precautions/Restrictions  Precautions Precautions: None  Exercise/Treatments Standing Heel Raises: 20 reps Heel Raises Limitations: Toe raises x 20 Functional Squats: 20 reps Other Standing Lumbar Exercises: Gait training with SPC with cueing for posture, sequencing with SPC and to equalize stride length Other Standing Lumbar Exercises: Hip abduciton/ extension with tactile cueing for form 10x Bil LE, Side stepping and retro gait 1RT, hip hiking Lt LE on 2 in step Sidelying Clam: 10 reps;5 seconds Hip Abduction: 10 reps Hip Abduction Limitations: Active Other Sidelying Lumbar Exercises: adduction 10x bilateral LE  Physical Therapy Assessment and Plan PT Assessment and Plan Clinical Impression Statement: Session focus on hip strengthening exercises and balance activities to improve proprioception with foot placement to improve gait mechanics.  Pt continues to require cueing for posture, sequencing with SPC and to equalize stride length.  Pt given hip hiking worksheet to add to HEP. PT Plan: Continue to progress stabilization exercises and gait. Emphasize hip strengthening.    Goals    Problem List Patient  Active Problem List   Diagnosis Date Noted  . Bilateral leg weakness 09/23/2012  . Acute renal failure 07/25/2012  . Dehydration 07/25/2012  . DM type 2, uncontrolled, with neuropathy 07/25/2012  . DM (diabetes mellitus), type 2, uncontrolled 07/25/2012  . Essential hypertension, benign 07/25/2012  . Generalized weakness 07/25/2012  . Hyperlipidemia   . Chronic kidney disease   . Skin cancer   . Hypertension   . GERD (gastroesophageal reflux disease)   . Breast cancer 02/19/2012  . CAP (community acquired pneumonia) 04/19/2011  . Nausea and vomiting 04/19/2011  . HTN (hypertension) 04/19/2011  . DM type 2 (diabetes mellitus, type 2) 04/19/2011  . Arthritis 04/19/2011    PT - End of Session Activity Tolerance: Patient tolerated treatment well General Behavior During Therapy: [facility] for tasks assessed/performed  GP    Aldona Lento 10/11/2012, 1:06 PM

## 2012-10-14 ENCOUNTER — Ambulatory Visit (HOSPITAL_COMMUNITY)
Admission: RE | Admit: 2012-10-14 | Discharge: 2012-10-14 | Disposition: A | Discharge status: Home / Self Care | Payer: Medicare Other | Source: Ambulatory Visit | Attending: Pulmonary Disease | Admitting: Pulmonary Disease

## 2012-10-14 DIAGNOSIS — R29898 Other symptoms and signs involving the musculoskeletal system: Secondary | ICD-10-CM

## 2012-10-14 NOTE — Progress Notes (Signed)
Physical Therapy Treatment Patient Details  Name: [name] MRN: [id number] Date of Birth: [birth year]  Today's Date: 10/14/2012 Time: [id number] PT Time Calculation (min): 45 min  Visit#: 10 of 12  Re-eval: 10/23/12  charge:  Neuromuscular re-ed [id number]; there activity; [id number]; there ex [phone]  Authorization: [employer]-medicare  Authorization Visit#: 10 of 20   Subjective: Symptoms/Limitations Symptoms: Pt states that getting up from a chair and going up steps are much easiier now Pain Assessment Pain Score: 0-No pain Pain Location: Back   Exercise/Treatments   Standing Scapular Retraction: 10 reps Row: 10 reps Shoulder Extension: 10 reps Other Standing Lumbar Exercises: Gt training with cane shoulers back  Other Standing Lumbar Exercises: chest stretch; standing with B UE flexion to improve posturn Mini squats   Working on walking with core tight head up and shoulders back Supine Bridge: 10 reps;Limitations Bridge Limitations: with adductor squeeze Straight Leg Raise: 10 reps Straight Leg Raises Limitations: floating Sidelying Clam: 15 reps Hip Abduction: 15 reps Prone  Single Arm Raise: 10 reps Straight Leg Raise: 10 reps Other Prone Lumbar Exercises: heel squeeze x 10 Other Prone Lumbar Exercises: rows, w-back , shld extension with2 # , x 10 @ Quadruped Other Quadruped Lumbar Exercises: all four lean onto arms and hold x 10 Other Quadruped Lumbar Exercises: dynamic moving hands x 10     Physical Therapy Assessment and Plan PT Assessment and Plan Clinical Impression Statement: Continue to focus on core strengthening and keeping core strong when ambulating.  Added standing posture exercises. PT Frequency: Min 3X/week PT Duration: 4 weeks PT Plan: Continue to progress stabilization exercises and gait. Emphasize hip strengthening.   Pt to be reevaluated at the end of this week.    Goals  progressing  Problem List Patient Active Problem List   Diagnosis  Date Noted  . Bilateral leg weakness 09/23/2012  . Acute renal failure 07/25/2012  . Dehydration 07/25/2012  . DM type 2, uncontrolled, with neuropathy 07/25/2012  . DM (diabetes mellitus), type 2, uncontrolled 07/25/2012  . Essential hypertension, benign 07/25/2012  . Generalized weakness 07/25/2012  . Hyperlipidemia   . Chronic kidney disease   . Skin cancer   . Hypertension   . GERD (gastroesophageal reflux disease)   . Breast cancer 02/19/2012  . CAP (community acquired pneumonia) 04/19/2011  . Nausea and vomiting 04/19/2011  . HTN (hypertension) 04/19/2011  . DM type 2 (diabetes mellitus, type 2) 04/19/2011  . Arthritis 04/19/2011    PT - End of Session Activity Tolerance: Patient tolerated treatment well  GP Functional Assessment Tool Used: [employer]-medicare Functional Limitation: Self care Self Care Current Status [id number]): At least 20 percent but less than 40 percent impaired, limited or restricted Self Care Goal Status [id number]): At least 1 percent but less than 20 percent impaired, limited or restricted  Mieke Brinley,CINDY 10/14/2012, 12:19 PM

## 2012-10-15 ENCOUNTER — Ambulatory Visit (INDEPENDENT_AMBULATORY_CARE_PROVIDER_SITE_OTHER): Payer: Medicare Other | Admitting: General Surgery

## 2012-10-15 ENCOUNTER — Encounter (INDEPENDENT_AMBULATORY_CARE_PROVIDER_SITE_OTHER): Payer: Self-pay | Admitting: General Surgery

## 2012-10-15 VITALS — BP 140/76 | HR 70 | Resp 16 | Ht 62.0 in | Wt 169.0 lb

## 2012-10-15 DIAGNOSIS — C50919 Malignant neoplasm of unspecified site of unspecified female breast: Secondary | ICD-10-CM

## 2012-10-15 DIAGNOSIS — C50912 Malignant neoplasm of unspecified site of left female breast: Secondary | ICD-10-CM

## 2012-10-15 NOTE — Progress Notes (Signed)
Subjective:     Patient ID: [name], female   DOB: August 26, 1933, 79 y.o.   MRN: [id number]  HPI The patient is a 79 year old white female who is 6 months status post left breast lumpectomy and negative sentinel node biopsy for a T1 B. N0 left breast cancer. Since her last visit she underwent back surgery and is still recovering from that. She is walking with the cane. She is taking anastrozole and has no obvious side effects from this. She denies any breast pain.  Review of Systems  Constitutional: Negative.   HENT: Negative.   Eyes: Negative.   Respiratory: Negative.   Cardiovascular: Negative.   Gastrointestinal: Negative.   Endocrine: Negative.   Genitourinary: Negative.   Musculoskeletal: Positive for back pain.  Skin: Negative.   Allergic/Immunologic: Negative.   Neurological: Negative.   Hematological: Negative.   Psychiatric/Behavioral: Negative.        Objective:   Physical Exam  Constitutional: She is oriented to person, place, and time. She appears well-developed and well-nourished.  HENT:  Head: Normocephalic and atraumatic.  Eyes: Conjunctivae and EOM are normal. Pupils are equal, round, and reactive to light.  Neck: Normal range of motion. Neck supple.  Cardiovascular: Normal rate, regular rhythm and normal heart sounds.   Pulmonary/Chest: Effort normal and breath sounds normal.  There is no palpable mass in either breast. Her left breast incision has healed nicely with no sign of infection or seroma. There is no palpable axillary, cervical, or supraclavicular lymphadenopathy  Abdominal: Soft. Bowel sounds are normal. She exhibits no mass. There is no tenderness.  Musculoskeletal: Normal range of motion.  Lymphadenopathy:    She has no cervical adenopathy.  Neurological: She is alert and oriented to person, place, and time.  Skin: Skin is warm and dry.  Psychiatric: She has a normal mood and affect. Her behavior is normal.       Assessment:     The  patient is 6 months status post left breast lumpectomy for breast cancer     Plan:     At this point she will continue to take anastrozole. She will continue to do regular self exams. She will continue to work with physical therapy on her back. We will plan to see her back in about 6 months

## 2012-10-15 NOTE — Patient Instructions (Signed)
Continue arimidex Continue regular self exams

## 2012-10-16 ENCOUNTER — Ambulatory Visit (HOSPITAL_COMMUNITY)
Admission: RE | Admit: 2012-10-16 | Discharge: 2012-10-16 | Disposition: A | Discharge status: Home / Self Care | Payer: Medicare Other | Source: Ambulatory Visit | Attending: Hematology and Oncology | Admitting: Hematology and Oncology

## 2012-10-16 ENCOUNTER — Ambulatory Visit (HOSPITAL_COMMUNITY)
Admission: RE | Admit: 2012-10-16 | Discharge: 2012-10-16 | Disposition: A | Discharge status: Home / Self Care | Payer: Medicare Other | Source: Ambulatory Visit | Attending: Pulmonary Disease | Admitting: Pulmonary Disease

## 2012-10-16 DIAGNOSIS — R29898 Other symptoms and signs involving the musculoskeletal system: Secondary | ICD-10-CM

## 2012-10-16 DIAGNOSIS — C50912 Malignant neoplasm of unspecified site of left female breast: Secondary | ICD-10-CM

## 2012-10-16 NOTE — Progress Notes (Signed)
Physical Therapy Treatment Patient Details  Name: MEYAH SALASAR MRN: [id number] Date of Birth: [birth year]  Today's Date: 10/16/2012 Time: [id number] PT Time Calculation (min): 43 min Charge: Gait [id number], TE [id number]  Visit#: 11 of 12  Re-eval: 10/23/12 Assessment Diagnosis: S/P back surgery Surgical Date: 06/27/12 Next MD Visit: Luan Pulling  Prior Therapy: SNF  Authorization: [employer]-medicare  Authorization Time Period:    Authorization Visit#: 11 of 20   Subjective: Symptoms/Limitations Symptoms: Pt entered dept walking with SPC, stated she took pain meds prior therapy today.  Feeling good currentl7. Pain Assessment Currently in Pain?: No/denies  Precautions/Restrictions  Precautions Precautions: None  Exercise/Treatments Standing Other Standing Lumbar Exercises: Gt training with SPC, cueing for posture, sequencing and equalize stride length; cueing to prevent Lt hip dropping  Other Standing Lumbar Exercises: Hip hiking with Rt LE on 2 in step 15x Seated Other Seated Lumbar Exercises: heel and toe roll outs 5x 10" Supine Bridge: 15 reps;Limitations Bridge Limitations: with adductor squeeze Sidelying Clam: 15 reps Hip Abduction: 15 reps Other Sidelying Lumbar Exercises: adduction 15x bilateral LE    Physical Therapy Assessment and Plan PT Assessment and Plan Clinical Impression Statement: Session focus on hip strengthening to improve gait mechanics.   Gait training with verbal cueing for posture, sequencing with SPC and equalized stride length for more normalized gait mechanics.  Added seated exercise to improve deep hip musculature for IR and ER strengthening. PT Plan: Re-eval next session.  Continue to progress stabilization exercises and gait. Emphasize hip strengthening.       Goals    Problem List Patient Active Problem List   Diagnosis Date Noted  . Bilateral leg weakness 09/23/2012  . Acute renal failure 07/25/2012  . Dehydration 07/25/2012  . DM type  2, uncontrolled, with neuropathy 07/25/2012  . DM (diabetes mellitus), type 2, uncontrolled 07/25/2012  . Essential hypertension, benign 07/25/2012  . Generalized weakness 07/25/2012  . Hyperlipidemia   . Chronic kidney disease   . Skin cancer   . Hypertension   . GERD (gastroesophageal reflux disease)   . Breast cancer 02/19/2012  . CAP (community acquired pneumonia) 04/19/2011  . Nausea and vomiting 04/19/2011  . HTN (hypertension) 04/19/2011  . DM type 2 (diabetes mellitus, type 2) 04/19/2011  . Arthritis 04/19/2011    PT - End of Session Activity Tolerance: Patient tolerated treatment well General Behavior During Therapy: [facility] for tasks assessed/performed  GP    Aldona Lento 10/16/2012, 9:45 AM

## 2012-10-18 ENCOUNTER — Ambulatory Visit (HOSPITAL_COMMUNITY)
Admission: RE | Admit: 2012-10-18 | Discharge: 2012-10-18 | Disposition: A | Discharge status: Home / Self Care | Payer: Medicare Other | Source: Ambulatory Visit | Attending: Pulmonary Disease | Admitting: Pulmonary Disease

## 2012-10-18 DIAGNOSIS — R29898 Other symptoms and signs involving the musculoskeletal system: Secondary | ICD-10-CM

## 2012-10-18 NOTE — Progress Notes (Signed)
Physical Therapy Treatment Patient Details  Name: [name] MRN: [id number] Date of Birth: June 10, 1933  Today's Date: 10/18/2012 Time: [id number] PT Time Calculation (min): 48 min Charge:  There ex x 46 Visit#: 12 of 28  Re-eval: 11/01/12 Assessment Diagnosis: S/P back surgery Surgical Date: 06/27/12 Next MD Visit: Luan Pulling  Prior Therapy: SNF  Authorization: [employer]-medicare  Authorization Visit#: 12 of 20   Subjective: Symptoms/Limitations Symptoms: Pt states she is still having a difficult time walking and standing up straight.  Pt  is doing her exercises at least once a day.  Pertinent History: breast surgery for cancer in January.  Pt has had 3 previous back surgeries. How long can you sit comfortably?: Pt is able to sit for an hour  How long can you stand comfortably?: Pt is able to stand for 30 minutes but feels more stable than she did; pt was stating 30 minutes at initial eval. How long can you walk comfortably?: Pt is walking with a cane for 20 minutes straight now.  Was walking with walker 30 min. Patient Stated Goals: to walk without the walker. Pain Assessment Currently in Pain?: No/denies Pain Location: Back Pain Orientation: Lower  Precautions/Restrictions  Precautions Precautions: None  Exercise/Treatments     Aerobic Stationary Bike: nustep L 3 x 8'   Standing Wall Slides: 10 reps Other Standing Lumbar Exercises: complete w-back while standing erect shoulders even x 15 Sidelying:  Clam: 15 reps Hip Abduction: 15 reps Prone  Single Arm Raise: 10 reps Straight Leg Raise: 10 reps Opposite Arm/Leg Raise: 10 reps Other Prone Lumbar Exercises: heel squeeze x 15 Other Prone Lumbar Exercises: w-back, rows, shld ext w/2 # x 10    Physical Therapy Assessment and Plan PT Assessment and Plan Clinical Impression Statement: Pt weakest mm is hp abductors.  Pt reevaluated two weeks ago no need to reevaluate today.  Pt recieved MD order to contimue therapy     Goals  Progressing.  Problem List Patient Active Problem List   Diagnosis Date Noted  . Bilateral leg weakness 09/23/2012  . Acute renal failure 07/25/2012  . Dehydration 07/25/2012  . DM type 2, uncontrolled, with neuropathy 07/25/2012  . DM (diabetes mellitus), type 2, uncontrolled 07/25/2012  . Essential hypertension, benign 07/25/2012  . Generalized weakness 07/25/2012  . Hyperlipidemia   . Chronic kidney disease   . Skin cancer   . Hypertension   . GERD (gastroesophageal reflux disease)   . Breast cancer 02/19/2012  . CAP (community acquired pneumonia) 04/19/2011  . Nausea and vomiting 04/19/2011  . HTN (hypertension) 04/19/2011  . DM type 2 (diabetes mellitus, type 2) 04/19/2011  . Arthritis 04/19/2011    PT - End of Session Activity Tolerance: Patient tolerated treatment well General Behavior During Therapy: [facility] for tasks assessed/performed  GP Functional Assessment Tool Used: [employer]-medicare Functional Limitation: Self care  Jayziah Bankhead,CINDY 10/18/2012, 10:14 AM

## 2012-10-22 ENCOUNTER — Ambulatory Visit (HOSPITAL_COMMUNITY)
Admission: RE | Admit: 2012-10-22 | Discharge: 2012-10-22 | Disposition: A | Discharge status: Home / Self Care | Payer: Medicare Other | Source: Ambulatory Visit | Attending: Pulmonary Disease | Admitting: Pulmonary Disease

## 2012-10-22 NOTE — Progress Notes (Signed)
Physical Therapy Treatment Patient Details  Name: [name] MRN: [id number] Date of Birth: 1933/09/20  Today's Date: 10/22/2012 Time: 0930-1015 PT Time Calculation (min): 45 min Charge:  There ex [phone] Visit#: 13 of 18  Re-eval: 11/01/12    Authorization: [employer]-medicare     Authorization Visit#: 12 of 18   Subjective: Symptoms/Limitations Symptoms: Pt questions why she limps so with walking.  Explained to pt how weak her hip abductors are and the role they play in walking. Pain Assessment Currently in Pain?: No/denies      Exercise/Treatments     Standing Wall Slides: 10 reps Other Standing Lumbar Exercises: hip abductions keeping toes forward x 10 Supine Bridge: 15 reps;Limitations Bridge Limitations: with ball squeeze Other Supine Lumbar Exercises: hip ab/adductio x 10 Other Supine Lumbar Exercises: isometric hip ab/adduction Sidelying Clam: 15 reps Hip Abduction: 15 reps Prone  Single Arm Raise: 10 reps Straight Leg Raise: 10 reps Opposite Arm/Leg Raise: 10 reps Other Prone Lumbar Exercises: heel squeeze x 15 Other Prone Lumbar Exercises: w-back, rows, shld ext w/2 # x 10    Physical Therapy Assessment and Plan PT Assessment and Plan Clinical Impression Statement: Therapist emphasized to pt the importance of completeing hip abduction w/t-band while sitting at least 40 times a day. Pt doing activity 3x /week.  Pt continues to gain strength. PT Plan: Pt treatment to emphasis strengthening hip abductors.    Goals  progressing  Problem List Patient Active Problem List   Diagnosis Date Noted  . Bilateral leg weakness 09/23/2012  . Acute renal failure 07/25/2012  . Dehydration 07/25/2012  . DM type 2, uncontrolled, with neuropathy 07/25/2012  . DM (diabetes mellitus), type 2, uncontrolled 07/25/2012  . Essential hypertension, benign 07/25/2012  . Generalized weakness 07/25/2012  . Hyperlipidemia   . Chronic kidney disease   . Skin cancer   .  Hypertension   . GERD (gastroesophageal reflux disease)   . Breast cancer 02/19/2012  . CAP (community acquired pneumonia) 04/19/2011  . Nausea and vomiting 04/19/2011  . HTN (hypertension) 04/19/2011  . DM type 2 (diabetes mellitus, type 2) 04/19/2011  . Arthritis 04/19/2011       GP    RUSSELL,CINDY 10/22/2012, 2:14 PM

## 2012-10-24 ENCOUNTER — Ambulatory Visit (HOSPITAL_COMMUNITY)
Admission: RE | Admit: 2012-10-24 | Discharge: 2012-10-24 | Disposition: A | Discharge status: Home / Self Care | Payer: Medicare Other | Source: Ambulatory Visit | Attending: Pulmonary Disease | Admitting: Pulmonary Disease

## 2012-10-24 NOTE — Progress Notes (Signed)
Physical Therapy Treatment Patient Details  Name: [name] MRN: [id number] Date of Birth: May 20, 1933  Today's Date: 10/24/2012 Time: 1015-1058 PT Time Calculation (min): 43 min Charges: Therex x 30' (1015-1045) Gait x [phone])  Visit#: 14 of 18  Re-eval: 11/01/12  Authorization: [employer]med[employer]  Authorization Visit#: 14 of 18   Subjective: Symptoms/Limitations Symptoms: Pt states that she is trying to be more aware of the way she's walking. Pain Assessment Currently in Pain?: No/denies   Exercise/Treatments Stretches Quad Stretch: 2 reps;30 seconds;Limitations Sports administrator Limitations: with rope with POE; given for HEP Standing Wall Slides: 10 reps Other Standing Lumbar Exercises: hip abduction keeping toes forward bilateral x 10; hip extension x 10 bilateral Other Standing Lumbar Exercises: Gait training around dept with SPC and multimodal cueing to decrease left external rotation and trendelenburg pattern Supine Bridge: 15 reps;Limitations Bridge Limitations: with ball squeeze Other Supine Lumbar Exercises: hip ab/adductio x 10 Other Supine Lumbar Exercises: isometric hip ab/adduction Sidelying Clam: 15 reps Hip Abduction: 15 reps Prone  Straight Leg Raise: 10 reps  Physical Therapy Assessment and Plan PT Assessment and Plan Clinical Impression Statement: Treatment focus on improving hip strength and gait mechanics. Pt requires multimodal cueing to avoid left hip external rotation and trendelenburg gait patterns. Hip strength appears to be gradually improving. PT Plan: Continue to improve gait mechanics and improve hip strength per PT POC.      Problem List Patient Active Problem List   Diagnosis Date Noted  . Bilateral leg weakness 09/23/2012  . Acute renal failure 07/25/2012  . Dehydration 07/25/2012  . DM type 2, uncontrolled, with neuropathy 07/25/2012  . DM (diabetes mellitus), type 2, uncontrolled 07/25/2012  . Essential hypertension, benign  07/25/2012  . Generalized weakness 07/25/2012  . Hyperlipidemia   . Chronic kidney disease   . Skin cancer   . Hypertension   . GERD (gastroesophageal reflux disease)   . Breast cancer 02/19/2012  . CAP (community acquired pneumonia) 04/19/2011  . Nausea and vomiting 04/19/2011  . HTN (hypertension) 04/19/2011  . DM type 2 (diabetes mellitus, type 2) 04/19/2011  . Arthritis 04/19/2011    PT - End of Session Activity Tolerance: Patient tolerated treatment well General Behavior During Therapy: [facility] for tasks assessed/performed   Rachelle Hora, PTA  10/24/2012, 11:52 AM

## 2012-10-25 ENCOUNTER — Ambulatory Visit (HOSPITAL_COMMUNITY)
Admission: RE | Admit: 2012-10-25 | Discharge: 2012-10-25 | Disposition: A | Discharge status: Home / Self Care | Payer: Medicare Other | Source: Ambulatory Visit | Attending: Pulmonary Disease | Admitting: Pulmonary Disease

## 2012-10-25 DIAGNOSIS — R29898 Other symptoms and signs involving the musculoskeletal system: Secondary | ICD-10-CM

## 2012-10-25 NOTE — Progress Notes (Signed)
Physical Therapy Treatment Patient Details  Name: [name] MRN: [id number] Date of Birth: 05-May-1933  Today's Date: 10/25/2012  Time: [id number] PT Time Calculation (min): 48 min Charge; Gait [phone], TE [phone]  Visit#: 15 of 18  Re-eval: 11/01/12    Authorization:    Authorization Time Period:    Authorization Visit#: 15 of 18   Subjective: Symptoms/Limitations Symptoms: Pt stated she was in pain this morning but it has been resolved currently.   Pain Assessment Currently in Pain?: No/denies  Objective:   Exercise/Treatments Standing Wall Slides: 10 reps Other Standing Lumbar Exercises: hip abduction keeping toes forward bilateral x 10; hip extension x 10 bilateral Other Standing Lumbar Exercises: side stepping 2RT with cueing to reduce ER; hip hiking 10x; Gait training with SPC to decrease ER and trendelenburg pattern Seated Other Seated Lumbar Exercises: heel rollout 10x 10" Supine Bridge: 15 reps;Limitations Bridge Limitations: with ball squeeze Sidelying Clam: 15 reps Hip Abduction: 15 reps  Physical Therapy Assessment and Plan PT Assessment and Plan Clinical Impression Statement: Session focus on hip strengthening to improve gait mechanics.  Pt continues to require multimodal cueing to reduce Lt hip ER and trendelenburg gait pattern.  Pt with improve posture this session, less cuesing required for posture. PT Plan: Continue to improve gait mechanics and improve hip strength per PT POC.     Goals    Problem List Patient Active Problem List   Diagnosis Date Noted  . Bilateral leg weakness 09/23/2012  . Acute renal failure 07/25/2012  . Dehydration 07/25/2012  . DM type 2, uncontrolled, with neuropathy 07/25/2012  . DM (diabetes mellitus), type 2, uncontrolled 07/25/2012  . Essential hypertension, benign 07/25/2012  . Generalized weakness 07/25/2012  . Hyperlipidemia   . Chronic kidney disease   . Skin cancer   . Hypertension   . GERD  (gastroesophageal reflux disease)   . Breast cancer 02/19/2012  . CAP (community acquired pneumonia) 04/19/2011  . Nausea and vomiting 04/19/2011  . HTN (hypertension) 04/19/2011  . DM type 2 (diabetes mellitus, type 2) 04/19/2011  . Arthritis 04/19/2011    PT - End of Session Activity Tolerance: Patient tolerated treatment well General Behavior During Therapy: [facility] for tasks assessed/performed  GP    Aldona Lento 10/25/2012, 9:49 AM

## 2012-10-29 ENCOUNTER — Ambulatory Visit (HOSPITAL_COMMUNITY)
Admission: RE | Admit: 2012-10-29 | Discharge: 2012-10-29 | Disposition: A | Discharge status: Home / Self Care | Payer: Medicare Other | Source: Ambulatory Visit | Attending: Pulmonary Disease | Admitting: Pulmonary Disease

## 2012-10-29 DIAGNOSIS — R269 Unspecified abnormalities of gait and mobility: Secondary | ICD-10-CM | POA: Insufficient documentation

## 2012-10-29 DIAGNOSIS — M545 Low back pain, unspecified: Secondary | ICD-10-CM | POA: Insufficient documentation

## 2012-10-29 DIAGNOSIS — I10 Essential (primary) hypertension: Secondary | ICD-10-CM | POA: Insufficient documentation

## 2012-10-29 DIAGNOSIS — E119 Type 2 diabetes mellitus without complications: Secondary | ICD-10-CM | POA: Insufficient documentation

## 2012-10-29 DIAGNOSIS — IMO0001 Reserved for inherently not codable concepts without codable children: Secondary | ICD-10-CM | POA: Insufficient documentation

## 2012-10-29 DIAGNOSIS — R29898 Other symptoms and signs involving the musculoskeletal system: Secondary | ICD-10-CM | POA: Insufficient documentation

## 2012-10-29 NOTE — Progress Notes (Signed)
Physical Therapy Treatment Patient Details  Name: [name] MRN: [id number] Date of Birth: [birth year]  Today's Date: 10/29/2012 Time: [id number] PT Time Calculation (min): 42 min Charges: Therex x [id number]) Gait x 10'(0922-0932)  Visit#: 16 of 18  Re-eval: 11/01/12  Authorization: [employer]medicare  Authorization Visit#: 16 of 18   Subjective: Symptoms/Limitations Symptoms: Pt states that she has pain when she first wakes up. Pain subsides after she completes HEP. Pain Assessment Currently in Pain?: No/denies  Precautions/Restrictions     Exercise/Treatments Standing Other Standing Lumbar Exercises: hip abduction keeping toes forward bilateral 2 x 10; hip extension x 10 bilateral Other Standing Lumbar Exercises: side stepping 2RT with cueing to reduce ER; hip hiking 10x; Gait training with SPC to decrease ER and trendelenburg pattern Supine Bridge: 15 reps;5 seconds Sidelying Clam: 15 reps Hip Abduction: 15 reps  Physical Therapy Assessment and Plan PT Assessment and Plan Clinical Impression Statement: Focus continues to be on hip strengthening and improving gait mechanics. Pt requires multimodal cueing with gait to improve posture, avoid left hip external rotation, and decrease trendelenburg pattern. Pt continues to show gradual improvements in hip strength. PT Plan: Continue to improve gait mechanics and improve hip strength per PT POC.      Problem List Patient Active Problem List   Diagnosis Date Noted  . Bilateral leg weakness 09/23/2012  . Acute renal failure 07/25/2012  . Dehydration 07/25/2012  . DM type 2, uncontrolled, with neuropathy 07/25/2012  . DM (diabetes mellitus), type 2, uncontrolled 07/25/2012  . Essential hypertension, benign 07/25/2012  . Generalized weakness 07/25/2012  . Hyperlipidemia   . Chronic kidney disease   . Skin cancer   . Hypertension   . GERD (gastroesophageal reflux disease)   . Breast cancer 02/19/2012  . CAP (community  acquired pneumonia) 04/19/2011  . Nausea and vomiting 04/19/2011  . HTN (hypertension) 04/19/2011  . DM type 2 (diabetes mellitus, type 2) 04/19/2011  . Arthritis 04/19/2011    PT - End of Session Activity Tolerance: Patient tolerated treatment well General Behavior During Therapy: [facility] for tasks assessed/performed  Rachelle Hora, PTA  10/29/2012, 9:46 AM

## 2012-10-31 ENCOUNTER — Ambulatory Visit (HOSPITAL_COMMUNITY)
Admission: RE | Admit: 2012-10-31 | Discharge: 2012-10-31 | Disposition: A | Discharge status: Home / Self Care | Payer: Medicare Other | Source: Ambulatory Visit | Attending: Pulmonary Disease | Admitting: Pulmonary Disease

## 2012-10-31 NOTE — Progress Notes (Addendum)
Physical Therapy Treatment Patient Details  Name: [name] MRN: [id number] Date of Birth: 02/17/1934  Today's Date: 10/31/2012 Time: 1015-1100 PT Time Calculation (min): 45 min Charges: Therex x [id number])  Visit#: 17 of 18  Re-eval: 11/01/12  Authorization: [employer]-medicare  Authorization Visit#: 17 of 18   Subjective: Symptoms/Limitations Symptoms: Pt states that she is trying to walk better. Pain Assessment Currently in Pain?: No/denies   Exercise/Treatments Standing Other Standing Lumbar Exercises: hip abduction keeping toes forward bilateral 2 x 10; hip extension x 10 bilateral; Rocker board R/Lx2' Other Standing Lumbar Exercises: side stepping 2RT with cueing to reduce ER; hip hiking 10x; Supine Bridge: 15 reps;5 seconds Straight Leg Raise: 10 reps Other Supine Lumbar Exercises: Windshield wipers 10x5" Sidelying Clam: 15 reps Hip Abduction: 15 reps Prone  Other Prone Lumbar Exercises: Hamstring curl 15xbilateral  Physical Therapy Assessment and Plan PT Assessment and Plan Clinical Impression Statement: Pt displays improved gait mechanics and is able to self corrects more frequently. Pt requires vc's to keep hips even when completing bridges. Pt is progressing well toward goals. PT Plan: Reassess next session.  Problem List Patient Active Problem List   Diagnosis Date Noted  . Bilateral leg weakness 09/23/2012  . Acute renal failure 07/25/2012  . Dehydration 07/25/2012  . DM type 2, uncontrolled, with neuropathy 07/25/2012  . DM (diabetes mellitus), type 2, uncontrolled 07/25/2012  . Essential hypertension, benign 07/25/2012  . Generalized weakness 07/25/2012  . Hyperlipidemia   . Chronic kidney disease   . Skin cancer   . Hypertension   . GERD (gastroesophageal reflux disease)   . Breast cancer 02/19/2012  . CAP (community acquired pneumonia) 04/19/2011  . Nausea and vomiting 04/19/2011  . HTN (hypertension) 04/19/2011  . DM type 2 (diabetes  mellitus, type 2) 04/19/2011  . Arthritis 04/19/2011    PT - End of Session Activity Tolerance: Patient tolerated treatment well General Behavior During Therapy: [facility] for tasks assessed/performed  Rachelle Hora, PTA  10/31/2012, 12:25 PM

## 2012-11-01 ENCOUNTER — Ambulatory Visit (HOSPITAL_COMMUNITY)
Admission: RE | Admit: 2012-11-01 | Discharge: 2012-11-01 | Disposition: A | Discharge status: Home / Self Care | Payer: Medicare Other | Source: Ambulatory Visit | Attending: Pulmonary Disease | Admitting: Pulmonary Disease

## 2012-11-01 NOTE — Evaluation (Signed)
Physical Therapy Reassessment Patient Details  Name: [name] MRN: [id number] Date of Birth: Mar 06, 1933  Today's Date: 11/01/2012 Time: [id number] PT Time Calculation (min): 45 min              Visit#: 18 of 30  Re-eval: 11/01/12 Assessment Diagnosis: S/P back surgery Surgical Date: 06/27/12 Next MD Visit: Luan Pulling  [facility] Therapy[facility]  845-900 M[facility]; 900-930 there ex  Authorization: [employer]-medicare    Authorization Time Period:    Authorization Visit#: 56 of 28   Past Medical History:  Past Medical History  Diagnosis Date  . Diabetes mellitus   . Arthritis   . Hyperlipidemia   . Mental disorder   . Chronic kidney disease     renal stones, one episode of Lithotripsy  . GERD (gastroesophageal reflux disease)   . Skin cancer     face- melanoma, 2012- surg. excision    . Breast cancer, left     er/pr +  . Hypertension     followed by [facility] relative to  urology study grp.  Doesn't report ever having a stress test   . Complication of anesthesia   . PONV (postoperative nausea and vomiting)   . Cataract     no surgery as yet,starting of 3 years  . Seasonal allergies    Past Surgical History:  Past Surgical History  Procedure Laterality Date  . Cholecystectomy    . Abdominal hysterectomy    . Skin biopsy    . Shoulder surgery    . Tubal ligation    . Back surgery      x4 back surgery to include rods & screws    . Fracture surgery      1975- ORIF- L ankle   . Breast lumpectomy with needle localization and axillary sentinel lymph node bx  03/12/2012    Procedure: BREAST LUMPECTOMY WITH NEEDLE LOCALIZATION AND AXILLARY SENTINEL LYMPH NODE BX;  Surgeon: Merrie Roof, MD;  Location: [place];  Service: General;  Laterality: Left;  . Re-excision of breast lumpectomy  03/22/2012    Procedure: RE-EXCISION OF BREAST LUMPECTOMY;  Surgeon: Merrie Roof, MD;  Location: [place];  Service: General;  Laterality: Left;  re-excision left breast  lumpectomy cavity  ER+, PR+  . Rotator cuff repair Bilateral   . Breast surgery      lumpectomy x2    Subjective Symptoms/Limitations Symptoms: Pt states that she is not where she needs to be but she is improving Pertinent History: breast surgery for cancer in January.  Pt has had 3 previous back surgeries. How long can you sit comfortably?: P is abel to sit for an hour was an hour. How long can you stand comfortably?: Able to stand for 40 minutes was 30 minutes. How long can you walk comfortably?: Walking with a cane for 30 minutes was 20 minutes. Patient Stated Goals: to walk without the walker. Pain Assessment Currently in Pain?: No/denies (when she gets up pain is a  7/10) Pain Score: 0-No pain  Precautions/Restrictions  Precautions Precautions: None  Sensation/Coordination/Flexibility/Functional Tests Functional Tests Functional Tests: oswestry 34/50  Assessment RLE Strength Right Hip Flexion: 4/5 (was a 4-/5) Right Hip Extension: 4/5 (was 4/5) Right Hip ABduction: 4/5 Right Hip ADduction: 4/5 Right Knee Flexion: 5/5 Right Knee Extension: 5/5 (was 4/5) Right Ankle Dorsiflexion: 5/5 (was 4/5) LLE Strength Left Hip Flexion: 3+/5 (was 3+/5) Left Hip Extension: 4/5 (was 4/5) Left Hip ABduction: 3+/5 (was 3-/5) Left Knee Flexion:  3+/5 Left Knee Extension:  (4+/5 was 4/5) Left Ankle Dorsiflexion: 5/5 (was 4/5)  Exercise/Treatments    Standing Other Standing Lumbar Exercises: hip abduction x 10   Supine Other Supine Lumbar Exercises: hip ab/adduction x 15 Sidelying Clam: 10 reps Hip Abduction: 10 reps;Weights;Limitations Hip Abduction Weights (lbs): 2# R then 10 more reps with 0# Hip Abduction Limitations: Lt two sets of 10 Prone  Straight Leg Raise: 10 reps;Limitations Straight Leg Raises Limitations: hold for 5 seconds each     Physical Therapy Assessment and Plan PT Assessment and Plan Clinical Impression Statement: Pt continues to show gains in strength  which is improving her stability of her gait but still has significant weakness and gait disturbance.  Will contine to see pt to improve safety of gt. Rehab Potential: Good PT Frequency: Min 3X/week PT Duration: 4 weeks PT Plan: Continue to improve gait mechanics and improve hip strength per PT POC.     Goals Home Exercise Program PT Goal: Perform Home Exercise Program - Progress: Met PT Short Term Goals PT Short Term Goal 1: Pt to be ambulating with a cane inside the house PT Short Term Goal 1 - Progress: Met PT Short Term Goal 2: Pt strength increased 1/2 grade to allow pt to do the above PT Short Term Goal 2 - Progress: Met PT Short Term Goal 3: Pt to be ambulating with a cane x 22min PT Short Term Goal 3 - Progress: Progressing toward goal PT Short Term Goal 4: Pt to be able to verbalize the importance of posture and body mechanics in back care PT Short Term Goal 4 - Progress: Met PT Long Term Goals Time to Complete Long Term Goals: 4 weeks PT Long Term Goal 1: Pt to ambulate inside with no assistive device for over an hour to allow shopping PT Long Term Goal 1 - Progress: Not met PT Long Term Goal 2: Pt strength to increase one grade to allow the above PT Long Term Goal 2 - Progress: Progressing toward goal Long Term Goal 3: Pt to be able to go up and down steps reciprocally Long Term Goal 3 Progress: Not met Long Term Goal 4: Pt to be able to sit for two hours to enjoy a nice meal out Long Term Goal 5 Progress: Progressing toward goal  Problem List Patient Active Problem List   Diagnosis Date Noted  . Bilateral leg weakness 09/23/2012  . Acute renal failure 07/25/2012  . Dehydration 07/25/2012  . DM type 2, uncontrolled, with neuropathy 07/25/2012  . DM (diabetes mellitus), type 2, uncontrolled 07/25/2012  . Essential hypertension, benign 07/25/2012  . Generalized weakness 07/25/2012  . Hyperlipidemia   . Chronic kidney disease   . Skin cancer   . Hypertension   .  GERD (gastroesophageal reflux disease)   . Breast cancer 02/19/2012  . CAP (community acquired pneumonia) 04/19/2011  . Nausea and vomiting 04/19/2011  . HTN (hypertension) 04/19/2011  . DM type 2 (diabetes mellitus, type 2) 04/19/2011  . Arthritis 04/19/2011    PT - End of Session Activity Tolerance: Patient tolerated treatment well General Behavior During Therapy: [name] for tasks assessed/performed  GP Functional Assessment Tool Used: Oswestry ;clinical judgement Functional Limitation: Self care Self Care Current Status [id number]): At least 60 percent but less than 80 percent impaired, limited or restricted Self Care Goal Status [id number]): At least 1 percent but less than 20 percent impaired, limited or restricted  Vedanth Sirico,CINDY 11/01/2012, 4:59 PM  Physician Documentation Your signature is required  to indicate approval of the treatment plan as stated above.  Please sign and either send electronically or make a copy of this report for your files and return this physician signed original.   Please mark one 1.__approve of plan  2. ___approve of plan with the following conditions.   ______________________________                                                          _____________________ Physician Signature                                                                                                             Date

## 2012-11-04 ENCOUNTER — Ambulatory Visit (HOSPITAL_COMMUNITY)
Admission: RE | Admit: 2012-11-04 | Discharge: 2012-11-04 | Disposition: A | Discharge status: Home / Self Care | Payer: Medicare Other | Source: Ambulatory Visit | Attending: Pulmonary Disease | Admitting: Pulmonary Disease

## 2012-11-04 NOTE — Progress Notes (Signed)
Physical Therapy Treatment Patient Details  Name: [name] MRN: [id number] Date of Birth: [birth year]  Today's Date: 11/04/2012 Time: [id number] PT Time Calculation (min): 42 min Charges: Therex x 40' (0847-926)  Visit#: 18 of 30  Re-eval: 11/01/12  Authorization: [employer]-medicare  Authorization Visit#: 18 of 28   Subjective: Symptoms/Limitations Symptoms: "I feel like I'm walking better" Pain Assessment Currently in Pain?: No/denies  Precautions/Restrictions     Exercise/Treatments Standing Other Standing Lumbar Exercises: hip abduction/extension x 10 Other Standing Lumbar Exercises: side stepping 2RT with cueing to reduce ER; Retro gait 1RT with TC for posture hip hiking 10x; Gait training with SPC to decrease ER and trendelenburg pattern Supine Bridge: 15 reps;5 seconds Other Supine Lumbar Exercises: IR/ER x 10 bilateral Sidelying Clam: 10 reps Hip Abduction: 10 reps;Weights;Limitations Hip Abduction Weights (lbs): 2# R then 10 more reps with 0# Hip Abduction Limitations: Lt two sets of 10 Prone  Straight Leg Raise: 10 reps;Limitations Straight Leg Raises Limitations: hold for 5 seconds each  Physical Therapy Assessment and Plan PT Assessment and Plan Clinical Impression Statement: Pt continues to progress well. Gait mechanics continue to improve. Pt is becoming more aware of external rotation and is able to self-correct more often. Pt continues to be adherent to HEP. Rehab Potential: Good PT Frequency: Min 3X/week PT Duration: 4 weeks PT Plan: Continue to improve gait mechanics and improve hip strength per PT POC.     Problem List Patient Active Problem List   Diagnosis Date Noted  . Bilateral leg weakness 09/23/2012  . Acute renal failure 07/25/2012  . Dehydration 07/25/2012  . DM type 2, uncontrolled, with neuropathy 07/25/2012  . DM (diabetes mellitus), type 2, uncontrolled 07/25/2012  . Essential hypertension, benign 07/25/2012  . Generalized weakness  07/25/2012  . Hyperlipidemia   . Chronic kidney disease   . Skin cancer   . Hypertension   . GERD (gastroesophageal reflux disease)   . Breast cancer 02/19/2012  . CAP (community acquired pneumonia) 04/19/2011  . Nausea and vomiting 04/19/2011  . HTN (hypertension) 04/19/2011  . DM type 2 (diabetes mellitus, type 2) 04/19/2011  . Arthritis 04/19/2011    PT - End of Session Activity Tolerance: Patient tolerated treatment well General Behavior During Therapy: [facility] for tasks assessed/performed  Rachelle Hora, PTA  11/04/2012, 9:39 AM

## 2012-11-05 ENCOUNTER — Other Ambulatory Visit (HOSPITAL_COMMUNITY): Payer: Self-pay | Admitting: Internal Medicine

## 2012-11-05 DIAGNOSIS — C50912 Malignant neoplasm of unspecified site of left female breast: Secondary | ICD-10-CM

## 2012-11-06 ENCOUNTER — Ambulatory Visit (HOSPITAL_COMMUNITY)
Admission: RE | Admit: 2012-11-06 | Discharge: 2012-11-06 | Disposition: A | Discharge status: Home / Self Care | Payer: Medicare Other | Source: Ambulatory Visit | Attending: Pulmonary Disease | Admitting: Pulmonary Disease

## 2012-11-06 NOTE — Progress Notes (Signed)
Physical Therapy Treatment Patient Details  Name: [name] MRN: [id number] Date of Birth: Mar 22, 1933  Today's Date: 11/06/2012 Time: [id number] PT Time Calculation (min): 50 min  Visit#: 19 of 30  Re-eval: 11/29/12 Charges: Therex x 45' (0838-0923)  Authorization: [employer]-medicare  Authorization Visit#: 19 of 28   Subjective: Symptoms/Limitations Symptoms: Pt is pain free and without complaint. Pain Assessment Currently in Pain?: Yes   Exercise/Treatments Standing Other Standing Lumbar Exercises: hip abduction/extension x 10; Hip hikes on 4" step x 10 bilateral Other Standing Lumbar Exercises: side stepping 2RT with cueing to reduce ER; Retro gait 1RT with TC for posture hip hiking 10x; Gait training with SPC to decrease ER and trendelenburg pattern Supine Bridge: 15 reps;5 seconds Straight Leg Raise: 10 reps Sidelying Clam: 15 reps Hip Abduction: 15 reps Hip Abduction Weights (lbs): 2# R x15 Hip Abduction Limitations: Lt x 15 no wt Prone  Straight Leg Raise: 10 reps;Limitations Straight Leg Raises Limitations: hold for 5 seconds each Other Prone Lumbar Exercises: Multifiduc contraction 10x5" with multimodal cueing  Physical Therapy Assessment and Plan PT Assessment and Plan Clinical Impression Statement: Pt continues to displays improvements in gait and posture. Pt also displays improved stability with decreased LOB during session. Began multifidus contraction in prone to improve strength. Pt requires multimodal cueing to complete. Rehab Potential: Good PT Frequency: Min 3X/week PT Duration: 4 weeks PT Plan: Continue to improve gait mechanics and improve hip strength per PT POC.      Problem List Patient Active Problem List   Diagnosis Date Noted  . Bilateral leg weakness 09/23/2012  . Acute renal failure 07/25/2012  . Dehydration 07/25/2012  . DM type 2, uncontrolled, with neuropathy 07/25/2012  . DM (diabetes mellitus), type 2, uncontrolled 07/25/2012  .  Essential hypertension, benign 07/25/2012  . Generalized weakness 07/25/2012  . Hyperlipidemia   . Chronic kidney disease   . Skin cancer   . Hypertension   . GERD (gastroesophageal reflux disease)   . Breast cancer 02/19/2012  . CAP (community acquired pneumonia) 04/19/2011  . Nausea and vomiting 04/19/2011  . HTN (hypertension) 04/19/2011  . DM type 2 (diabetes mellitus, type 2) 04/19/2011  . Arthritis 04/19/2011    PT - End of Session Activity Tolerance: Patient tolerated treatment well General Behavior During Therapy: [facility] for tasks assessed/performed  Rachelle Hora, PTA  11/06/2012, 9:42 AM

## 2012-11-08 ENCOUNTER — Ambulatory Visit (HOSPITAL_COMMUNITY)
Admission: RE | Admit: 2012-11-08 | Discharge: 2012-11-08 | Disposition: A | Discharge status: Home / Self Care | Payer: Medicare Other | Source: Ambulatory Visit | Attending: Pulmonary Disease | Admitting: Pulmonary Disease

## 2012-11-08 DIAGNOSIS — R29898 Other symptoms and signs involving the musculoskeletal system: Secondary | ICD-10-CM

## 2012-11-08 NOTE — Progress Notes (Signed)
Physical Therapy Treatment Patient Details  Name: LORI-ANNE ABDALLA MRN: [id number] Date of Birth: [birth year]  Today's Date: 11/08/2012 Time: [id number] PT Time Calculation (min): 44 min There ex [phone]; gt 920-930 Visit#: 20 of 30  Re-eval: 11/29/12   Authorization: [employer]med[employer]  Authorization Visit#: 20 of 28   Subjective: Symptoms/Limitations Symptoms: Pt is completing her ex 2x a day Pain Assessment Currently in Pain?: No/denies     Exercise/Treatments   Standing Wall Slides: 10 reps Other Standing Lumbar Exercises: SLS with therapist assist x 10 sec x5;  supported against wall hip ab x 10sec x 10 Other Standing Lumbar Exercises: Gt in dept with no forward bend   Sidelying Hip Abduction: 15 reps Other Sidelying Lumbar Exercises: hip circles clockwise/counter x 15 each Prone  Straight Leg Raise: 15 reps Straight Leg Raises Limitations: hold x 10 sex 4" on Rt Quadruped Other Quadruped Lumbar Exercises: hip abduction x 15.    Physical Therapy Assessment and Plan PT Assessment and Plan Clinical Impression Statement: PT trendelenburg gait is significantly decreased when pt uses a walker.  Pt is also able to ambulate without a forward bend when ambulating with a walker.  Pt was encouraged to not  use her cane and walk with the walker.  Therapist feels it will be six months before pt will be able to walk with a cane. PT Plan: Pt to work on static standing activites as well as mat activities.  Limit dynamic due to poor technique after fatiguing     Goals  progressing  Problem List Patient Active Problem List   Diagnosis Date Noted  . Bilateral leg weakness 09/23/2012  . Acute renal failure 07/25/2012  . Dehydration 07/25/2012  . DM type 2, uncontrolled, with neuropathy 07/25/2012  . DM (diabetes mellitus), type 2, uncontrolled 07/25/2012  . Essential hypertension, benign 07/25/2012  . Generalized weakness 07/25/2012  . Hyperlipidemia   . Chronic kidney disease   .  Skin cancer   . Hypertension   . GERD (gastroesophageal reflux disease)   . Breast cancer 02/19/2012  . CAP (community acquired pneumonia) 04/19/2011  . Nausea and vomiting 04/19/2011  . HTN (hypertension) 04/19/2011  . DM type 2 (diabetes mellitus, type 2) 04/19/2011  . Arthritis 04/19/2011    PT - End of Session Activity Tolerance: Patient tolerated treatment well General Behavior During Therapy: [facility] for tasks assessed/performed  GP    Yamir Carignan,CINDY 11/08/2012, 9:48 AM

## 2012-11-11 ENCOUNTER — Ambulatory Visit (HOSPITAL_COMMUNITY)
Admission: RE | Admit: 2012-11-11 | Discharge: 2012-11-11 | Disposition: A | Discharge status: Home / Self Care | Payer: Medicare Other | Source: Ambulatory Visit | Attending: Pulmonary Disease | Admitting: Pulmonary Disease

## 2012-11-11 DIAGNOSIS — R29898 Other symptoms and signs involving the musculoskeletal system: Secondary | ICD-10-CM

## 2012-11-11 NOTE — Progress Notes (Signed)
Physical Therapy Treatment Patient Details  Name: [name] MRN: [id number] Date of Birth: June 06, 1933  Today's Date: 11/11/2012 Time: [id number] PT Time Calculation (min): 40 min Charges:  NMR: 15 min TE: 25 Visit#: 21 of 30  Re-eval: 11/29/12    Authorization: [employer]med[employer]  Authorization Time Period:    Authorization Visit#: 21 of 28   Subjective: Symptoms/Limitations Symptoms: Pt reports that she took a pain pill this morning and it is a 7/10.  She has been using her RW at home and out in the community.  She did not bring it into therapy today due to increased difficulty getting it out of the car and a short distance to walk into therapy.  Pain Assessment Pain Score: 7  Pain Location: Back  Precautions/Restrictions     Exercise/Treatments Aerobic Stationary Bike: nustep L 2 , [place] #2  x 8' w/o UE A for LE strengthening Standing Other Standing Lumbar Exercises: SLS on 4 inch step with 5x10 sec holds BLE to improve posture. Supine Bridge: 15 reps;5 seconds Bridge Limitations: with ball squeeze, PT faciliation to lift Rt hip Sidelying Clam: 5 reps;Limitations Clam Limitations: 10 sec holds PT facilitaion for proper coordination Hip Abduction: Limitations Hip Abduction Weights (lbs): Rt 10 reps 5 sec holds; Rt unable to complete with proper form) Other Sidelying Lumbar Exercises: Reverse clam (hip IR) 5 reps 5 sec holds Lt side) Prone  Opposite Arm/Leg Raise: Right arm/Left leg;Left arm/Right leg;5 reps;5 seconds;Limitations Opposite Arm/Leg Raise Limitations: PT faciliation  Physical Therapy Assessment and Plan PT Assessment and Plan Clinical Impression Statement: Pt 14 min late for apt today.  Continued with hip and core strengthening, d/c Rt hip abd today due to signfiicant impairments with proper technique even with PT facialiation and cueing.  Requires mod-max facilitation and cueing with all exercises today. Added hip IR strengthening and shows signfiicant  deficit to Lt LE and requires more cueing for RLE for bridging.  Overall pt is giving great effort with all activities and is most limitied by her coordination and technique.  Added NuStep at end of session to continue with LE strengtheing.  PT Plan: Pt to work on static standing activites as well as mat activities.  Limit dynamic due to poor technique after fatiguing.     Goals Home Exercise Program Pt/caregiver will Perform Home Exercise Program: For increased strengthening PT Goal: Perform Home Exercise Program - Progress: Met PT Short Term Goals PT Short Term Goal 1: Pt to be ambulating with a cane inside the house (reverted back to RW due to contined Trendelenberg) PT Short Term Goal 1 - Progress: Partly met PT Short Term Goal 2: Pt strength increased 1/2 grade to allow pt to do the above PT Short Term Goal 2 - Progress: Met PT Short Term Goal 3: Pt to be ambulating with a cane x 64min PT Short Term Goal 3 - Progress: Progressing toward goal PT Short Term Goal 4: Pt to be able to verbalize the importance of posture and body mechanics in back care PT Short Term Goal 4 - Progress: Met PT Long Term Goals Time to Complete Long Term Goals: 4 weeks PT Long Term Goal 1: Pt to ambulate inside with no assistive device for over an hour to allow shopping PT Long Term Goal 2: Pt strength to increase one grade to allow the above Long Term Goal 3: Pt to be able to go up and down steps reciprocally Long Term Goal 4: Pt to be able to sit for two  hours to enjoy a nice meal out  Problem List Patient Active Problem List   Diagnosis Date Noted  . Bilateral leg weakness 09/23/2012  . Acute renal failure 07/25/2012  . Dehydration 07/25/2012  . DM type 2, uncontrolled, with neuropathy 07/25/2012  . DM (diabetes mellitus), type 2, uncontrolled 07/25/2012  . Essential hypertension, benign 07/25/2012  . Generalized weakness 07/25/2012  . Hyperlipidemia   . Chronic kidney disease   . Skin cancer   .  Hypertension   . GERD (gastroesophageal reflux disease)   . Breast cancer 02/19/2012  . CAP (community acquired pneumonia) 04/19/2011  . Nausea and vomiting 04/19/2011  . HTN (hypertension) 04/19/2011  . DM type 2 (diabetes mellitus, type 2) 04/19/2011  . Arthritis 04/19/2011    PT - End of Session Activity Tolerance: Patient tolerated treatment well General Behavior During Therapy: [facility] for tasks assessed/performed PT Plan of Care PT Patient Instructions: continue with using RW  Consulted and Agree with Plan of Care: Patient  GP    Tephanie Escorcia, MPT, ATC 11/11/2012, 9:43 AM

## 2012-11-13 ENCOUNTER — Ambulatory Visit (HOSPITAL_COMMUNITY)
Admission: RE | Admit: 2012-11-13 | Discharge: 2012-11-13 | Disposition: A | Discharge status: Home / Self Care | Payer: Medicare Other | Source: Ambulatory Visit | Attending: Pulmonary Disease | Admitting: Pulmonary Disease

## 2012-11-13 NOTE — Progress Notes (Signed)
Physical Therapy Treatment Patient Details  Name: [name] MRN: [id number] Date of Birth: October 22, 1933  Today's Date: 11/13/2012 Time: [id number] PT Time Calculation (min): 44 min Charges: Gait x 12' [phone]) Therex x 28' (0900-0928)  Visit#: 22 of 30  Re-eval: 11/29/12  Authorization: [employer]medicare  Authorization Visit#: 22 of 28   Subjective: Symptoms/Limitations Symptoms: Pt states that she had pain this morning which subsided after pain pill.  Pain Assessment Currently in Pain?: No/denies  Exercise/Treatments Standing Other Standing Lumbar Exercises: Rocker board R/L with hips in neutral rotation; Tandem stand x 1' bilateral with HHA PRN Other Standing Lumbar Exercises: SLS with one foot on 4" step 1' bilateral; Hip hikes on 4" step  Supine Bridge: 15 reps;5 seconds Bridge Limitations: with ball squeeze Sidelying Clam: 5 reps;Limitations Clam Limitations: 10 sec holds PT facilitaion for proper coordination Hip Abduction: 15 reps Other Sidelying Lumbar Exercises: Reverse clam (hip IR)10 reps 5 sec holds bilateral  Physical Therapy Assessment and Plan PT Assessment and Plan Clinical Impression Statement: Pt ambulates into therapy with rolling walker. Gait training completed with walker. Pt requires multimodal cueing to improve gait mechanics. Therapist utilized Scientist, research (medical) with gait training to encourage self-correction. Pt displays improve form with side lying abduction this session. Pt requires max multimodal cueing to isolate left glute med with right side lying clams. Hips strength continues to progress gradually. PT Plan: Pt to work on static standing activites as well as mat activities.  Limit dynamic due to poor technique after fatiguing.      Problem List Patient Active Problem List   Diagnosis Date Noted  . Bilateral leg weakness 09/23/2012  . Acute renal failure 07/25/2012  . Dehydration 07/25/2012  . DM type 2, uncontrolled, with neuropathy  07/25/2012  . DM (diabetes mellitus), type 2, uncontrolled 07/25/2012  . Essential hypertension, benign 07/25/2012  . Generalized weakness 07/25/2012  . Hyperlipidemia   . Chronic kidney disease   . Skin cancer   . Hypertension   . GERD (gastroesophageal reflux disease)   . Breast cancer 02/19/2012  . CAP (community acquired pneumonia) 04/19/2011  . Nausea and vomiting 04/19/2011  . HTN (hypertension) 04/19/2011  . DM type 2 (diabetes mellitus, type 2) 04/19/2011  . Arthritis 04/19/2011    PT - End of Session Activity Tolerance: Patient tolerated treatment well General Behavior During Therapy: [facility] for tasks assessed/performed   Rachelle Hora, PTA  11/13/2012, 10:04 AM

## 2012-11-15 ENCOUNTER — Ambulatory Visit (HOSPITAL_COMMUNITY)
Admission: RE | Admit: 2012-11-15 | Discharge: 2012-11-15 | Disposition: A | Discharge status: Home / Self Care | Payer: Medicare Other | Source: Ambulatory Visit | Attending: Pulmonary Disease | Admitting: Pulmonary Disease

## 2012-11-15 DIAGNOSIS — R29898 Other symptoms and signs involving the musculoskeletal system: Secondary | ICD-10-CM

## 2012-11-15 NOTE — Progress Notes (Signed)
Physical Therapy Treatment Patient Details  Name: [name] MRN: [id number] Date of Birth: January 05, 1934  Today's Date: 11/15/2012 Time: [id number] PT Time Calculation (min): 54 min Charge: NMR [id number], TE [id number]  Visit#: 23 of 30  Re-eval: 11/29/12    Authorization: [employer]-medicare  Authorization Time Period:    Authorization Visit#: 23 of 28   Subjective: Symptoms/Limitations Symptoms: Pt reports pain scale 4/10 Pain Assessment Currently in Pain?: Yes Pain Score: 4  Pain Location: Back Pain Orientation: Lower  Precautions/Restrictions  Precautions Precautions: None  Exercise/Treatments Aerobic Stationary Bike: [place] #3, resistance 3  x 10 minutes w/o UE A for LE strengthening Standing Lateral Step Up: Both;15 reps;Step Height: 2";Hand Hold: 1;Limitations Lateral Step Up Limitations: hip hiking 10" holds Bil LE Other Standing Knee Exercises: abduction Bil LE 10x 10" w/ therapist facilitation for posture; sidestepping with cueing to reduce trendelemburg 2 RT Other Standing Knee Exercises: tandem stance  1x 30" Bil LE; tandem stance on 4 in step 2x 30" Sidelying Clams: Reverse wtih AAROM Bil LE 10 x 10" with tactile cueing     Physical Therapy Assessment and Plan PT Assessment and Plan Clinical Impression Statement: Session focus on improving static balance training to improve posture and gait mechanics.  Multimodal cueing required to improve form and technique to reduce compensation with glut med movements. PT Plan: Pt to work on static standing activites as well as mat activities.  Limit dynamic due to poor technique after fatiguing.     Goals    Problem List Patient Active Problem List   Diagnosis Date Noted  . Bilateral leg weakness 09/23/2012  . Acute renal failure 07/25/2012  . Dehydration 07/25/2012  . DM type 2, uncontrolled, with neuropathy 07/25/2012  . DM (diabetes mellitus), type 2, uncontrolled 07/25/2012  . Essential hypertension,  benign 07/25/2012  . Generalized weakness 07/25/2012  . Hyperlipidemia   . Chronic kidney disease   . Skin cancer   . Hypertension   . GERD (gastroesophageal reflux disease)   . Breast cancer 02/19/2012  . CAP (community acquired pneumonia) 04/19/2011  . Nausea and vomiting 04/19/2011  . HTN (hypertension) 04/19/2011  . DM type 2 (diabetes mellitus, type 2) 04/19/2011  . Arthritis 04/19/2011    PT - End of Session Activity Tolerance: Patient tolerated treatment well General Behavior During Therapy: [facility] for tasks assessed/performed  GP    Aldona Lento 11/15/2012, 1:24 PM

## 2012-11-18 ENCOUNTER — Ambulatory Visit (HOSPITAL_COMMUNITY)
Admission: RE | Admit: 2012-11-18 | Discharge: 2012-11-18 | Disposition: A | Discharge status: Home / Self Care | Payer: Medicare Other | Source: Ambulatory Visit | Attending: Pulmonary Disease | Admitting: Pulmonary Disease

## 2012-11-18 NOTE — Progress Notes (Signed)
Physical Therapy Treatment Patient Details  Name: [name] MRN: [id number] Date of Birth: 10-22-1933  Today's Date: 11/18/2012 Time: [id number] PT Time Calculation (min): 45 min  Visit#: 24 of 30  Re-eval: 11/29/12 Authorization: [employer]-medicare  Authorization Visit#: 24 of 28  Charges:  therex [id number] (28'), gait 918-930 (12')  Subjective: Pt states she's fine after she takes a pain pill.    Exercise/Treatments Aerobic Stationary Bike: [place] #3, resistance 3  x 10 minutes w/o UE A for LE strengthening Standing Lateral Step Up: Both;15 reps;Hand Hold: 1;Limitations;Step Height: 4" Lateral Step Up Limitations: hip hiking 10" holds Bil LE Gait Training: with and without SPC in front of mirror working on correcting trendelenberg gait Other Standing Knee Exercises: abduction Bil LE 15x  w/ therapist facilitation for posture; sidestepping with cueing to reduce trendelemburg 2 RT Other Standing Knee Exercises: tandem stance  1x 30" Bil LE; tandem stance on 4 in step 2x 30" Sidelying Hip ABduction: 15 reps;Both Clams: 10X5" Left only Other Sidelying Knee Exercises: Reverse clams with AAROM 10X5" bilateral      Physical Therapy Assessment and Plan PT Assessment and Plan Clinical Impression Statement: Continued focus on increasing bilateral glute med strength (Lt weaker than Rt).  Requires AA to keep in alignment with therex to isolate mm correctly and tactile/VC's with ambulation to decrease trendelenberg. PT Plan: Pt to work on static standing activites as well as mat activities.  Limit dynamic due to poor technique after fatiguing.      Problem List Patient Active Problem List   Diagnosis Date Noted  . Bilateral leg weakness 09/23/2012  . Acute renal failure 07/25/2012  . Dehydration 07/25/2012  . DM type 2, uncontrolled, with neuropathy 07/25/2012  . DM (diabetes mellitus), type 2, uncontrolled 07/25/2012  . Essential hypertension, benign 07/25/2012  .  Generalized weakness 07/25/2012  . Hyperlipidemia   . Chronic kidney disease   . Skin cancer   . Hypertension   . GERD (gastroesophageal reflux disease)   . Breast cancer 02/19/2012  . CAP (community acquired pneumonia) 04/19/2011  . Nausea and vomiting 04/19/2011  . HTN (hypertension) 04/19/2011  . DM type 2 (diabetes mellitus, type 2) 04/19/2011  . Arthritis 04/19/2011    PT - End of Session Activity Tolerance: Patient tolerated treatment well General Behavior During Therapy: [facility] for tasks assessed/performed   Teena Irani, PTA/CLT 11/18/2012, 9:42 AM

## 2012-11-20 ENCOUNTER — Ambulatory Visit (HOSPITAL_COMMUNITY)
Admission: RE | Admit: 2012-11-20 | Discharge: 2012-11-20 | Disposition: A | Discharge status: Home / Self Care | Payer: Medicare Other | Source: Ambulatory Visit | Attending: Pulmonary Disease | Admitting: Pulmonary Disease

## 2012-11-20 NOTE — Progress Notes (Signed)
Physical Therapy Treatment Patient Details  Name: [name] MRN: [id number] Date of Birth: 1934/02/15  Today's Date: 11/20/2012 Time: [id number] PT Time Calculation (min): 42 min Charges: Gait x [phone]) Therex x [id number])  Visit#: 25 of 30  Re-eval: 11/29/12  Authorization: [employer]-medicare   Authorization Visit#: 25 of 28   Subjective: Symptoms/Limitations Symptoms: Pt reports continued HEP compliance.  Pain Assessment Currently in Pain?: No/denies   Exercise/Treatments Standing Other Standing Knee Exercises: Hip hike from flor level x 10 bilateral; Tandem stance 1'x2 bilateral; SLS with opposite fot on 4" step 1'x2 bilateral Other Standing Knee Exercises: Gait traing in front of mirror to improve self-correction Sidelying Hip ABduction: 15 reps;Both Clams: 10X5" Other Sidelying Knee Exercises: Reverse clams with AAROM 10X5" bilateral  Physical Therapy Assessment and Plan PT Assessment and Plan Clinical Impression Statement: Treatment focus continues to be on improving hip strength and gait mechanics. Pt requires multimodal cueing with clams to isolate glute med. Hip strength continues to gradually improve. PT Plan: Pt to work on static standing activites as well as mat activities.  Limit dynamic due to poor technique after fatiguing.      Problem List Patient Active Problem List   Diagnosis Date Noted  . Bilateral leg weakness 09/23/2012  . Acute renal failure 07/25/2012  . Dehydration 07/25/2012  . DM type 2, uncontrolled, with neuropathy 07/25/2012  . DM (diabetes mellitus), type 2, uncontrolled 07/25/2012  . Essential hypertension, benign 07/25/2012  . Generalized weakness 07/25/2012  . Hyperlipidemia   . Chronic kidney disease   . Skin cancer   . Hypertension   . GERD (gastroesophageal reflux disease)   . Breast cancer 02/19/2012  . CAP (community acquired pneumonia) 04/19/2011  . Nausea and vomiting 04/19/2011  . HTN (hypertension) 04/19/2011   . DM type 2 (diabetes mellitus, type 2) 04/19/2011  . Arthritis 04/19/2011    PT - End of Session Activity Tolerance: Patient tolerated treatment well General Behavior During Therapy: [facility] for tasks assessed/performed  Rachelle Hora, PTA  11/20/2012, 10:01 AM

## 2012-11-22 ENCOUNTER — Ambulatory Visit (HOSPITAL_COMMUNITY)
Admission: RE | Admit: 2012-11-22 | Discharge: 2012-11-22 | Disposition: A | Discharge status: Home / Self Care | Payer: Medicare Other | Source: Ambulatory Visit | Attending: Pulmonary Disease | Admitting: Pulmonary Disease

## 2012-11-22 DIAGNOSIS — R29898 Other symptoms and signs involving the musculoskeletal system: Secondary | ICD-10-CM

## 2012-11-25 ENCOUNTER — Ambulatory Visit (HOSPITAL_COMMUNITY)
Admission: RE | Admit: 2012-11-25 | Discharge: 2012-11-25 | Disposition: A | Discharge status: Home / Self Care | Payer: Medicare Other | Source: Ambulatory Visit | Attending: Pulmonary Disease | Admitting: Pulmonary Disease

## 2012-11-25 ENCOUNTER — Ambulatory Visit (HOSPITAL_COMMUNITY): Payer: Self-pay | Admitting: Physical Therapy

## 2012-11-25 NOTE — Progress Notes (Signed)
Physical Therapy Treatment Patient Details  Name: [name] MRN: [id number] Date of Birth: 12-23-1933  Today's Date: 11/25/2012 Time: [id number] PT Time Calculation (min): 45 min  Visit#: 26 of 30  Re-eval: 11/29/12 Authorization: [employer]-medicare  Authorization Visit#: 26 of 28  Charges:  therex 43'  Subjective: Symptoms/Limitations Symptoms: Pt came to therapy today with a new all wheel walker.  No pain today Pain Assessment Currently in Pain?: No/denies   Exercise/Treatments Aerobic Stationary Bike: [place] #3, resistance 3  x 10 minutes w/o UE A for LE strengthening Standing Other Standing Knee Exercises: Hip hike from floor level x 15 bilateral; Tandem stance 1'x2 bilateral; SLS with opposite foot on 4" step 1'x2 bilateral Sidelying Hip ABduction: 15 reps Hip ABduction Limitations: AA with Lt LE for proper form Clams: 15 X5" holds Other Sidelying Knee Exercises: reverse clam x 10      Physical Therapy Assessment and Plan PT Assessment and Plan Clinical Impression Statement: Continued focus on increasing hip strength and improving gait mechanics.  Pt with new walker today.  Continues to require AA with side lying hip exercises on the Lt LE. PT Plan: Pt to work on static standing activites as well as mat activities.  Limit dynamic due to poor technique after fatiguing.      Problem List Patient Active Problem List   Diagnosis Date Noted  . Bilateral leg weakness 09/23/2012  . Acute renal failure 07/25/2012  . Dehydration 07/25/2012  . DM type 2, uncontrolled, with neuropathy 07/25/2012  . DM (diabetes mellitus), type 2, uncontrolled 07/25/2012  . Essential hypertension, benign 07/25/2012  . Generalized weakness 07/25/2012  . Hyperlipidemia   . Chronic kidney disease   . Skin cancer   . Hypertension   . GERD (gastroesophageal reflux disease)   . Breast cancer 02/19/2012  . CAP (community acquired pneumonia) 04/19/2011  . Nausea and vomiting 04/19/2011   . HTN (hypertension) 04/19/2011  . DM type 2 (diabetes mellitus, type 2) 04/19/2011  . Arthritis 04/19/2011    PT - End of Session Activity Tolerance: Patient tolerated treatment well General Behavior During Therapy: [facility] for tasks assessed/performed   Teena Irani, PTA/CLT 11/25/2012, 1:54 PM

## 2012-11-27 ENCOUNTER — Ambulatory Visit (HOSPITAL_COMMUNITY)
Admission: RE | Admit: 2012-11-27 | Discharge: 2012-11-27 | Disposition: A | Discharge status: Home / Self Care | Payer: Medicare Other | Source: Ambulatory Visit | Attending: Pulmonary Disease | Admitting: Pulmonary Disease

## 2012-11-27 DIAGNOSIS — R29898 Other symptoms and signs involving the musculoskeletal system: Secondary | ICD-10-CM | POA: Insufficient documentation

## 2012-11-27 DIAGNOSIS — R269 Unspecified abnormalities of gait and mobility: Secondary | ICD-10-CM | POA: Insufficient documentation

## 2012-11-27 DIAGNOSIS — M545 Low back pain, unspecified: Secondary | ICD-10-CM | POA: Insufficient documentation

## 2012-11-27 DIAGNOSIS — I10 Essential (primary) hypertension: Secondary | ICD-10-CM | POA: Insufficient documentation

## 2012-11-27 DIAGNOSIS — IMO0001 Reserved for inherently not codable concepts without codable children: Secondary | ICD-10-CM | POA: Insufficient documentation

## 2012-11-27 DIAGNOSIS — E119 Type 2 diabetes mellitus without complications: Secondary | ICD-10-CM | POA: Insufficient documentation

## 2012-11-27 NOTE — Progress Notes (Signed)
Physical Therapy Treatment Patient Details  Name: [name] MRN: [id number] Date of Birth: 29-Oct-1933  Today's Date: 11/27/2012 Time: 0850-0950 PT Time Calculation (min): 60 min Charge there ex [id number] Visit#: 27 of 30  Re-eval: 11/29/12    Authorization: [employer]-medicare   Authorization Visit#: 27 of   20  Subjective: Symptoms/Limitations Symptoms: I have a little pain in my Rt hip and knee today Pain Assessment Pain Score: 5  Pain Location: Hip Pain Orientation: Right  Exercise/Treatments Aerobic Stationary Bike: [place] #3, resistance 3  x 10 minutes w/o UE A for LE strengthening Standing Lifting:  (raising UE into full flexion keeping back stabilized x10) Other Standing Lumbar Exercises: in front of mirror Ab/10 fast 10 slow Other Standing Lumbar Exercises: SLS x 10 seconds x 3 Hip Abduction: 10 reps;Limitations Hip Abduction Weights (lbs): 10 fast 10 slow with 5 sec hold Other Sidelying Lumbar Exercises: forward and reverse circles x 10  Quadruped Straight Leg Raise: 10 reps Other Quadruped Lumbar Exercises: hip abduction x 10     Physical Therapy Assessment and Plan PT Assessment and Plan Clinical Impression Statement: Pt continues to gain strength and stability in hip. Pt will need extended strengthening but is much more aware of substitutional patterns that she goes into due to weakness so should be ready for HEP and discharge. PT Plan: reassess on Friday as pt goes to [facility] on Monday    Goals  progresssing  Problem List Patient Active Problem List   Diagnosis Date Noted  . Bilateral leg weakness 09/23/2012  . Acute renal failure 07/25/2012  . Dehydration 07/25/2012  . DM type 2, uncontrolled, with neuropathy 07/25/2012  . DM (diabetes mellitus), type 2, uncontrolled 07/25/2012  . Essential hypertension, benign 07/25/2012  . Generalized weakness 07/25/2012  . Hyperlipidemia   . Chronic kidney disease   . Skin cancer   . Hypertension   . GERD  (gastroesophageal reflux disease)   . Breast cancer 02/19/2012  . CAP (community acquired pneumonia) 04/19/2011  . Nausea and vomiting 04/19/2011  . HTN (hypertension) 04/19/2011  . DM type 2 (diabetes mellitus, type 2) 04/19/2011  . Arthritis 04/19/2011       GP    [name],[name] 11/27/2012, 11:00 AM

## 2012-11-29 ENCOUNTER — Ambulatory Visit (HOSPITAL_COMMUNITY)
Admission: RE | Admit: 2012-11-29 | Discharge: 2012-11-29 | Disposition: A | Discharge status: Home / Self Care | Payer: Medicare Other | Source: Ambulatory Visit | Attending: Pulmonary Disease | Admitting: Pulmonary Disease

## 2012-11-29 NOTE — Evaluation (Signed)
Physical Therapy reassessment  Patient Details  Name: [name] MRN: [id number] Date of Birth: 27-Jan-1934  Today's Date: 11/29/2012 Time: 1300-1450 PT Time Calculation (min): 110 min Charge: there ex 1300-1325: mm test 1325-1340              Visit#: 28 of 28  Re-eval: 11/29/12 Assessment Diagnosis: S/P back surgery Surgical Date: 06/27/12 Next MD Visit: Luan Pulling 12/02/2012 Prior Therapy: SNF  Authorization: [employer]med[employer]are     Authorization Visit#: 37 of 28   Past Medical History:  Past Medical History  Diagnosis Date  . Diabetes mellitus   . Arthritis   . Hyperlipidemia   . Mental disorder   . Chronic kidney disease     renal stones, one episode of Lithotripsy  . GERD (gastroesophageal reflux disease)   . Skin cancer     face- melanoma, 2012- surg. excision    . Breast cancer, left     er/pr +  . Hypertension     followed by [facility]. relative to  urology study grp.  Doesn't report ever having a stress test   . Complication of anesthesia   . PONV (postoperative nausea and vomiting)   . Cataract     no surgery as yet,starting of 3 years  . Seasonal allergies    Past Surgical History:  Past Surgical History  Procedure Laterality Date  . Cholecystectomy    . Abdominal hysterectomy    . Skin biopsy    . Shoulder surgery    . Tubal ligation    . Back surgery      x4 back surgery to include rods & screws    . Fracture surgery      1975- ORIF- L ankle   . Breast lumpectomy with needle localization and axillary sentinel lymph node bx  03/12/2012    Procedure: BREAST LUMPECTOMY WITH NEEDLE LOCALIZATION AND AXILLARY SENTINEL LYMPH NODE BX;  Surgeon: Merrie Roof, MD;  Location: [facility];  Service: General;  Laterality: Left;  . Re-excision of breast lumpectomy  03/22/2012    Procedure: RE-EXCISION OF BREAST LUMPECTOMY;  Surgeon: Merrie Roof, MD;  Location: [place];  Service: General;  Laterality: Left;  re-excision left breast lumpectomy  cavity  ER+, PR+  . Rotator cuff repair Bilateral   . Breast surgery      lumpectomy x2    Subjective Symptoms/Limitations Symptoms: I still Have Ri hip and knee pain whenever I weightbear. How long can you sit comfortably?: Pt is able to sit for 45-60 minutes was an hour. How long can you stand comfortably?: Pt is able  to stand 30 minutes was 40 How long can you walk comfortably?: Pt is now walking with a walker due to the therapist requesting pt to go back to walker (due to trendelenburg gt is much improved with walker) for 45 minutes was 30 minutes with the cane. Pain Assessment Currently in Pain?: Yes Pain Score: 6  Pain Location: Hip Pain Orientation: Right  Precautions/Restrictions  Precautions Precautions: None   Sensation/Coordination/Flexibility/Functional Tests Functional Tests Functional Tests: oswestry remains 34/50  Assessment RLE Strength Right Hip Flexion:  ( 4+/5 was 4/5) Right Hip Extension:  ( 4+/5was 4/5) Right Hip ABduction: 4/5 (was 4/5) Right Hip ADduction: 5/5 (was 4/5) Right Knee Flexion: 5/5 (was 5/5) Right Knee Extension: 5/5 (was 5/5) Right Ankle Dorsiflexion: 5/5 (was 5/5) LLE Strength Left Hip Flexion:  (4+/5 was 3+/5) Left Hip Extension:  (4-/5) Left Hip ABduction: 3+/5 (in available  range was 3+/t) Left Hip ADduction: 3+/5 (was 3/5) Left Knee Flexion:  (4-/5 was 3+) Left Knee Extension:  (5-/5 was 4+) Left Ankle Dorsiflexion: 5/5 (was 5/5)  Exercise/Treatments Mobility/Balance  Posture/Postural Control Posture/Postural Control: Postural limitations Postural Limitations: improved posture but still needs to be ocassionally reminded     Aerobic Stationary Bike: [place] #3, resistance 4  x 10 minutes w/o UE A for LE strengthening   Standing Other Standing Knee Exercises: ab/ext 2# both fast and slow x 15 B Other Standing Knee Exercises: steps  Physical Therapy Assessment and Plan PT Assessment and Plan Clinical Impression  Statement: Pt continues to improve and is much more aware of substituttional patterns she will continue to gain strength completing a HEP PT Plan: Discharge.    Goals Home Exercise Program Pt/caregiver will Perform Home Exercise Program: For increased strengthening PT Short Term Goals Time to Complete Short Term Goals: 2 weeks PT Short Term Goal 1: Pt to be ambulating with a cane inside the house (thera) PT Short Term Goal 1 - Progress: Met (Pt has less pain and better gait with walker ) PT Short Term Goal 2: Pt strength increased 1/2 grade to allow pt to do the above PT Short Term Goal 2 - Progress: Met PT Short Term Goal 3: Pt to be ambulating with a cane x 4min (able to walk 45 min with a walker) PT Short Term Goal 3 - Progress: Revised (modified due to lack of progress/goal met) PT Short Term Goal 4: Pt to be able to verbalize the importance of posture and body mechanics in back care PT Short Term Goal 4 - Progress: Met PT Long Term Goals Time to Complete Long Term Goals: 4 weeks PT Long Term Goal 1: Pt to ambulate inside with no assistive device for over an hour to allow shopping (Pt is able to shop with the walker; ) PT Long Term Goal 1 - Progress: Revised (modified due to lack of progress/goal met) PT Long Term Goal 2: Pt strength to increase one grade to allow the above PT Long Term Goal 2 - Progress: Met Long Term Goal 3: Pt to be able to go up and down steps reciprocally Long Term Goal 3 Progress: Partly met (using rail ) Long Term Goal 4: Pt to be able to sit for two hours to enjoy a nice meal out Long Term Goal 4 Progress: Partly met PT Long Term Goal 5: Pt to be weaned from her back brace. Long Term Goal 5 Progress: Met  Problem List Patient Active Problem List   Diagnosis Date Noted  . Bilateral leg weakness 09/23/2012  . Acute renal failure 07/25/2012  . Dehydration 07/25/2012  . DM type 2, uncontrolled, with neuropathy 07/25/2012  . DM (diabetes mellitus), type 2,  uncontrolled 07/25/2012  . Essential hypertension, benign 07/25/2012  . Generalized weakness 07/25/2012  . Hyperlipidemia   . Chronic kidney disease   . Skin cancer   . Hypertension   . GERD (gastroesophageal reflux disease)   . Breast cancer 02/19/2012  . CAP (community acquired pneumonia) 04/19/2011  . Nausea and vomiting 04/19/2011  . HTN (hypertension) 04/19/2011  . DM type 2 (diabetes mellitus, type 2) 04/19/2011  . Arthritis 04/19/2011      GP Functional Assessment Tool Used: Oswestry ;clinical judgement Self Care Goal Status [id number]): At least 1 percent but less than 20 percent impaired, limited or restricted Self Care Discharge Status [phone]): At least 20 percent but less than 40 percent impaired,  limited or restricted  RUSSELL,CINDY 11/29/2012, 1:50 PM  Physician Documentation Your signature is required to indicate approval of the treatment plan as stated above.  Please sign and either send electronically or make a copy of this report for your files and return this physician signed original.   Please mark one 1.__approve of plan  2. ___approve of plan with the following conditions.   ______________________________                                                          _____________________ Physician Signature                                                                                                             Date

## 2012-12-02 ENCOUNTER — Ambulatory Visit (HOSPITAL_COMMUNITY): Payer: Self-pay | Admitting: Physical Therapy

## 2012-12-04 ENCOUNTER — Ambulatory Visit (HOSPITAL_COMMUNITY): Payer: Self-pay | Admitting: *Deleted

## 2012-12-06 ENCOUNTER — Ambulatory Visit (HOSPITAL_COMMUNITY): Payer: Self-pay | Admitting: Physical Therapy

## 2013-01-14 ENCOUNTER — Ambulatory Visit: Payer: Self-pay

## 2013-01-15 ENCOUNTER — Encounter (HOSPITAL_COMMUNITY): Payer: Self-pay

## 2013-01-22 ENCOUNTER — Ambulatory Visit (HOSPITAL_COMMUNITY)
Admission: RE | Admit: 2013-01-22 | Discharge: 2013-01-22 | Disposition: A | Discharge status: Home / Self Care | Payer: Medicare Other | Source: Ambulatory Visit | Attending: Internal Medicine | Admitting: Internal Medicine

## 2013-01-22 DIAGNOSIS — Z853 Personal history of malignant neoplasm of breast: Secondary | ICD-10-CM | POA: Insufficient documentation

## 2013-01-22 DIAGNOSIS — C50912 Malignant neoplasm of unspecified site of left female breast: Secondary | ICD-10-CM

## 2013-01-28 DIAGNOSIS — M899 Disorder of bone, unspecified: Secondary | ICD-10-CM

## 2013-01-28 DIAGNOSIS — C50919 Malignant neoplasm of unspecified site of unspecified female breast: Secondary | ICD-10-CM

## 2013-01-28 DIAGNOSIS — M949 Disorder of cartilage, unspecified: Secondary | ICD-10-CM

## 2013-02-04 ENCOUNTER — Ambulatory Visit (INDEPENDENT_AMBULATORY_CARE_PROVIDER_SITE_OTHER): Payer: Medicare Other

## 2013-02-04 VITALS — BP 138/70 | HR 83 | Resp 20 | Ht 61.0 in | Wt 171.0 lb

## 2013-02-04 DIAGNOSIS — M79609 Pain in unspecified limb: Secondary | ICD-10-CM

## 2013-02-04 DIAGNOSIS — E1149 Type 2 diabetes mellitus with other diabetic neurological complication: Secondary | ICD-10-CM

## 2013-02-04 DIAGNOSIS — E1142 Type 2 diabetes mellitus with diabetic polyneuropathy: Secondary | ICD-10-CM

## 2013-02-04 DIAGNOSIS — B351 Tinea unguium: Secondary | ICD-10-CM

## 2013-02-04 DIAGNOSIS — IMO0002 Reserved for concepts with insufficient information to code with codable children: Secondary | ICD-10-CM

## 2013-02-04 DIAGNOSIS — E1140 Type 2 diabetes mellitus with diabetic neuropathy, unspecified: Secondary | ICD-10-CM

## 2013-02-04 NOTE — Patient Instructions (Signed)
Diabetes and Foot Care Diabetes may cause you to have problems because of poor blood supply (circulation) to your feet and legs. This may cause the skin on your feet to become thinner, break easier, and heal more slowly. Your skin may become dry, and the skin may peel and crack. You may also have nerve damage in your legs and feet causing decreased feeling in them. You may not notice minor injuries to your feet that could lead to infections or more serious problems. Taking care of your feet is one of the most important things you can do for yourself.  HOME CARE INSTRUCTIONS  Wear shoes at all times, even in the house. Do not go barefoot. Bare feet are easily injured.  Check your feet daily for blisters, cuts, and redness. If you cannot see the bottom of your feet, use a mirror or ask someone for help.  Wash your feet with warm water (do not use hot water) and mild soap. Then pat your feet and the areas between your toes until they are completely dry. Do not soak your feet as this can dry your skin.  Apply a moisturizing lotion or petroleum jelly (that does not contain alcohol and is unscented) to the skin on your feet and to dry, brittle toenails. Do not apply lotion between your toes.  Trim your toenails straight across. Do not dig under them or around the cuticle. File the edges of your nails with an emery board or nail file.  Do not cut corns or calluses or try to remove them with medicine.  Wear clean socks or stockings every day. Make sure they are not too tight. Do not wear knee-high stockings since they may decrease blood flow to your legs.  Wear shoes that fit properly and have enough cushioning. To break in new shoes, wear them for just a few hours a day. This prevents you from injuring your feet. Always look in your shoes before you put them on to be sure there are no objects inside.  Do not cross your legs. This may decrease the blood flow to your feet.  If you find a minor scrape,  cut, or break in the skin on your feet, keep it and the skin around it clean and dry. These areas may be cleansed with mild soap and water. Do not cleanse the area with peroxide, alcohol, or iodine.  When you remove an adhesive bandage, be sure not to damage the skin around it.  If you have a wound, look at it several times a day to make sure it is healing.  Do not use heating pads or hot water bottles. They may burn your skin. If you have lost feeling in your feet or legs, you may not know it is happening until it is too late.  Make sure your health care provider performs a complete foot exam at least annually or more often if you have foot problems. Report any cuts, sores, or bruises to your health care provider immediately. SEEK MEDICAL CARE IF:   You have an injury that is not healing.  You have cuts or breaks in the skin.  You have an ingrown nail.  You notice redness on your legs or feet.  You feel burning or tingling in your legs or feet.  You have pain or cramps in your legs and feet.  Your legs or feet are numb.  Your feet always feel cold. SEEK IMMEDIATE MEDICAL CARE IF:   There is increasing redness,   swelling, or pain in or around a wound.  There is a red line that goes up your leg.  Pus is coming from a wound.  You develop a fever or as directed by your health care provider.  You notice a bad smell coming from an ulcer or wound. Document Released: 02/11/2000 Document Revised: 10/16/2012 Document Reviewed: 07/23/2012 Exit[employer] Patient Information 2014 [employer].  

## 2013-02-04 NOTE — Progress Notes (Signed)
   Subjective:    Patient ID: [name], female    DOB: 1933-12-30, 79 y.o.   MRN: [id number]  "Trim my nails."  HPI    Review of Systems no changes are updates at this time     Objective:   Physical Exam Vascular status is intact as follows DP +2/4 bilateral PT plus one over 4 bilateral Refill time 3 seconds all digits. Neurologically epicritic and proprioceptive sensations intact and symmetric the dorsum and leg however his absent sensation plantar forefoot and inferior heel and arch on Semmes Weinstein testing. Dermatologically skin color pigment normal hair growth absent nails thick brittle crumbly discolored and friable 1 through 5 bilateral no open wounds or ulcerations are noted at this time. Orthopedic biomechanical exam reveals rectus foot type mild digital contractures noted.       Assessment & Plan:  Assessment this time diabetes with history peripheral neuropathy. Is also is history of lumbar radiculopathy and multiple back surgeries contributing to neuropathy. Patient's nails thick brittle discolored friable and tender 1 through 5 bilateral mycotic nails debrided x10 the presence of diabetes and complications return for palliative care in 3 months for an as-needed basis  Harriet Masson DPM

## 2013-03-03 ENCOUNTER — Ambulatory Visit (HOSPITAL_COMMUNITY): Payer: Self-pay | Admitting: Physical Therapy

## 2013-03-05 ENCOUNTER — Ambulatory Visit (HOSPITAL_COMMUNITY)
Admission: RE | Admit: 2013-03-05 | Discharge: 2013-03-05 | Disposition: A | Discharge status: Home / Self Care | Payer: Medicare Other | Source: Ambulatory Visit | Attending: Neurosurgery | Admitting: Neurosurgery

## 2013-03-05 DIAGNOSIS — M47817 Spondylosis without myelopathy or radiculopathy, lumbosacral region: Secondary | ICD-10-CM | POA: Insufficient documentation

## 2013-03-05 DIAGNOSIS — I10 Essential (primary) hypertension: Secondary | ICD-10-CM | POA: Insufficient documentation

## 2013-03-05 DIAGNOSIS — M6281 Muscle weakness (generalized): Secondary | ICD-10-CM | POA: Insufficient documentation

## 2013-03-05 DIAGNOSIS — E119 Type 2 diabetes mellitus without complications: Secondary | ICD-10-CM | POA: Insufficient documentation

## 2013-03-05 DIAGNOSIS — R29898 Other symptoms and signs involving the musculoskeletal system: Secondary | ICD-10-CM

## 2013-03-05 DIAGNOSIS — IMO0001 Reserved for inherently not codable concepts without codable children: Secondary | ICD-10-CM | POA: Insufficient documentation

## 2013-03-05 DIAGNOSIS — R269 Unspecified abnormalities of gait and mobility: Secondary | ICD-10-CM | POA: Insufficient documentation

## 2013-03-05 DIAGNOSIS — R262 Difficulty in walking, not elsewhere classified: Secondary | ICD-10-CM | POA: Insufficient documentation

## 2013-03-05 NOTE — Evaluation (Addendum)
Physical Therapy Evaluation  Patient Details  Name: [name] MRN: [id number] Date of Birth: June 26, 1933  Today's Date: 03/05/2013 Time: [id number] PT Time Calculation (min): 41 min Charges: 1 evaluation              Visit#: 1 of 12  Re-eval: 04/02/13 Assessment Diagnosis: S/P back surgery Surgical Date: 06/27/12 Next MD Visit: Vertell Limber - February  Authorization: [employer]med[employer]are    Authorization Time Period:    Authorization Visit#: 1 of 10   Past Medical History:  Past Medical History  Diagnosis Date  . Diabetes mellitus   . Arthritis   . Hyperlipidemia   . Mental disorder   . Chronic kidney disease     renal stones, one episode of Lithotripsy  . GERD (gastroesophageal reflux disease)   . Skin cancer     face- melanoma, 2012- surg. excision    . Breast cancer, left     er/pr +  . Hypertension     followed by [facility]. relative to  urology study grp.  Doesn't report ever having a stress test   . Complication of anesthesia   . PONV (postoperative nausea and vomiting)   . Cataract     no surgery as yet,starting of 3 years  . Seasonal allergies    Past Surgical History:  Past Surgical History  Procedure Laterality Date  . Cholecystectomy    . Abdominal hysterectomy    . Skin biopsy    . Shoulder surgery    . Tubal ligation    . Back surgery      x4 back surgery to include rods & screws    . Fracture surgery      1975- ORIF- L ankle   . Breast lumpectomy with needle localization and axillary sentinel lymph node bx  03/12/2012    Procedure: BREAST LUMPECTOMY WITH NEEDLE LOCALIZATION AND AXILLARY SENTINEL LYMPH NODE BX;  Surgeon: Merrie Roof, MD;  Location: [facility];  Service: General;  Laterality: Left;  . Re-excision of breast lumpectomy  03/22/2012    Procedure: RE-EXCISION OF BREAST LUMPECTOMY;  Surgeon: Merrie Roof, MD;  Location: [facility];  Service: General;  Laterality: Left;  re-excision left breast lumpectomy cavity  ER+, PR+  . Rotator  cuff repair Bilateral   . Breast surgery      lumpectomy x2    Subjective Symptoms/Limitations Symptoms: Pt is a 80 year old female referred back to PT s/p lumbar fusion in May.  Last day was on 11/27/12.  She reports that she is still doing her exercises.  She is walking with her rollator on the paved driveway.  She uses a cane sometimes with a cane and has her husband hold her other. She is very scared of falling.  She plans after therapy to go to the mall and keep walking.   She had an x-ray of her Rt knee and had an injection in her knee in late October.  She had an x-ray of her back,and was told she was completly healed from the surgery.  Patient Stated Goals: she would like to focus on her balance and secondary is strength.  Pain Assessment Currently in Pain?: Yes  Precautions/Restrictions  Precautions Precautions: Fall  Balance Screening Balance Screen Has the patient fallen in the past 6 months: No Has the patient had a decrease in activity level because of a fear of falling? : Yes Is the patient reluctant to leave their home because of a fear of  falling? : No  Prior Functioning  Prior Function Comments: Would like to go walking in the mall.  She enjoy exercise  Sensation/Coordination/Flexibility/Functional Tests Functional Tests Functional Tests: 5 sit to stands without UE support: 17 seconds Functional Tests: 2 minute walk test: 159 feet w/o AD w/Min A Functional Tests: FOTO: 40/60  Assessment RLE Strength RLE Overall Strength Comments: taken in seated; Hip IR: 3-/5,, Hip ER: 3/5 Right Hip Flexion: 4/5 (was 4-/5) Right Hip Extension: 4/5 (was 4/5) Right Hip ABduction: 3+/5 (was 5/5) Right Hip ADduction: 3+/5 (was 4/5) Right Knee Flexion: 5/5 (was 5/5) Right Knee Extension: 5/5 (was 4/5) Right Ankle Dorsiflexion:  (was 4/5) LLE Strength LLE Overall Strength Comments: taken in seated; Hip IR: 2-/5,, Hip ER: 3/5 Left Hip Flexion: 4/5 (was 3+/5) Left Hip Extension:  3/5 (was 4/5) Left Hip ABduction: 3-/5 (was 3-/5) Left Hip ADduction: 3-/5 (was 3/5) Left Knee Flexion: 5/5 (was 5/5) Left Knee Extension: 5/5 (was 4/5) Left Ankle Dorsiflexion:  (was 4/5)  Mobility/Balance  Ambulation/Gait Ambulation/Gait: Yes Assistive device: Rollator Gait Pattern: Trendelenburg;Trunk flexed (Lt toe out) Gait velocity: 1.325 ft/sec Posture/Postural Control Posture/Postural Control: Postural limitations Postural Limitations: pt forward bent at 25 degrees with standing and walking activities Static Standing Balance Single Leg Stance - Right Leg: 0 Single Leg Stance - Left Leg: 0 Tandem Stance - Right Leg: 5 Tandem Stance - Left Leg: 5 Rhomberg - Eyes Opened: 60 Rhomberg - Eyes Closed: 60 Berg Balance Test Sit to Stand: Able to stand without using hands and stabilize independently Standing Unsupported: Able to stand safely 2 minutes Sitting with Back Unsupported but Feet Supported on Floor or Stool: Able to sit safely and securely 2 minutes Stand to Sit: Sits safely with minimal use of hands Transfers: Able to transfer safely, minor use of hands Standing Unsupported with Eyes Closed: Able to stand 10 seconds with supervision Standing Ubsupported with Feet Together: Able to place feet together independently and stand 1 minute safely From Standing, Reach Forward with Outstretched Arm: Can reach forward >12 cm safely (5") From Standing Position, Pick up Object from Floor: Able to pick up shoe, needs supervision From Standing Position, Turn to Look Behind Over each Shoulder: Looks behind one side only/other side shows less weight shift Turn 360 Degrees: Able to turn 360 degrees safely but slowly Standing Unsupported, Alternately Place Feet on Step/Stool: Needs assistance to keep from falling or unable to try Standing Unsupported, One Foot in Front: Able to take small step independently and hold 30 seconds Standing on One Leg: Unable to try or needs assist to prevent  fall Total Score: 40 Timed Up and Go Test TUG: Normal TUG Normal TUG (seconds): 13 (w/min A, w/o AD)   Exercise/Treatments Balance Exercises Standing Tandem Stance: Eyes open;Hand held assist (HHA) 1;1 rep;30 secs;Limitations Tandem Stance Limitations: BLE, PT facilaiton for posture Standing, One Foot on a Step: Eyes open;4 inch;1 rep;30 secs;Limitations Standing, One Foot on a Step Limitations: BLE, PT facilaiton for posture   Physical Therapy Assessment and Plan PT Assessment and Plan Clinical Impression Statement: Pt is a 80 year old female referred to PT for lumbar spondylosis s/p lumbar fusion in May with impairments listed below. She attended 46 OP PT visits last year (ending in Oct).  After evaluation it is found that she has most difficulty with her balance and impaired core and hip strength causing her current imapairments.  Pt will benefit from skilled therapeutic intervention in order to improve on the following deficits: Abnormal gait;Decreased  activity tolerance;Decreased balance;Decreased strength;Difficulty walking Rehab Potential: Good PT Frequency: Min 3X/week PT Duration: 6 weeks (6 weeks) PT Treatment/Interventions: Gait training;Stair training;Functional mobility training;Therapeutic activities;Therapeutic exercise;Balance training;DME instruction;Neuromuscular re-education;Patient/family education;Manual techniques PT Plan: Primary focus in on balance.  Continue to progress static standing to use of foam and instruct on proper posture.  Add foot strengthening and continue with hip strengthening activities.     Goals Home Exercise Program Pt/caregiver will Perform Home Exercise Program: For improved balance;For increased strengthening;Independently PT Goal: Perform Home Exercise Program - Progress: Goal set today PT Short Term Goals Time to Complete Short Term Goals: 3 weeks PT Short Term Goal 1: Pt will improve BLE strength by 1 muscle grade in order to stand for  greater than 10 minutes with intermittent HHA without a rest break to continue with balance activities.  PT Short Term Goal 2: Pt will improve her postural strength to [facility] in order to ambulate without trunk flexion.  PT Short Term Goal 3: Pt will improve her BLE hip IR and ER strength to 4/5 in order to ambulate with decreased Trendelenberg gait.  PT Short Term Goal 4: Pt will improve her self confidence and proprioceptive awarness to begin ambulating with a SPC in outdoor environment to participate in community activities.  PT Long Term Goals Time to Complete Long Term Goals:  (6 weeks) PT Long Term Goal 1: Pt will improve her berg balance test to greater than 50/56 to decrease risk of falls. PT Long Term Goal 2: Pt will improve her gait velocity to greater than 2.05 ft/sec with SPC in order to begin ambulating at the mall for exercises.  Long Term Goal 3: Pt will improve her postural strength to [facility] in order to ambulate with forward flexion less than 10 degrees with SPC to improve respiratory function.  Long Term Goal 4: Pt will improve her FOTO score to limitaion less than 50% for improved percieved functional ability.   Problem List Patient Active Problem List   Diagnosis Date Noted  . Bilateral leg weakness 09/23/2012  . Acute renal failure 07/25/2012  . Dehydration 07/25/2012  . DM type 2, uncontrolled, with neuropathy 07/25/2012  . DM (diabetes mellitus), type 2, uncontrolled 07/25/2012  . Essential hypertension, benign 07/25/2012  . Generalized weakness 07/25/2012  . Hyperlipidemia   . Chronic kidney disease   . Skin cancer   . Hypertension   . GERD (gastroesophageal reflux disease)   . Breast cancer 02/19/2012  . CAP (community acquired pneumonia) 04/19/2011  . Nausea and vomiting 04/19/2011  . HTN (hypertension) 04/19/2011  . DM type 2 (diabetes mellitus, type 2) 04/19/2011  . Arthritis 04/19/2011  ICD- 9 - Difficulty Walking 719.7 Lumbar Spondylosis 724.2  PT Plan of  Care PT Home Exercise Plan: updated HEP PT Patient Instructions: importance of posture, HEP and safe location for balance exercises.  Consulted and Agree with Plan of Care: Patient  GP Functional Assessment Tool Used: FOTO: 40/60 Functional Limitation: Other PT primary Self Care Current Status [id number]): At least 60 percent but less than 80 percent impaired, limited or restricted Self Care Goal Status [id number]): At least 40 percent but less than 60 percent impaired, limited or restricted  Samule Life, MPT, ATC 03/05/2013, 12:53 PM  Physician Documentation Your signature is required to indicate approval of the treatment plan as stated above.  Please sign and either send electronically or make a copy of this report for your files and return this physician signed original.   Please mark one 1.__approve  of plan  2. ___approve of plan with the following conditions.   ______________________________                                                          _____________________ Physician Signature                                                                                                             Date

## 2013-03-07 ENCOUNTER — Ambulatory Visit (HOSPITAL_COMMUNITY)
Admission: RE | Admit: 2013-03-07 | Discharge: 2013-03-07 | Disposition: A | Discharge status: Home / Self Care | Payer: Medicare Other | Source: Ambulatory Visit | Attending: Pulmonary Disease | Admitting: Pulmonary Disease

## 2013-03-07 DIAGNOSIS — R29898 Other symptoms and signs involving the musculoskeletal system: Secondary | ICD-10-CM

## 2013-03-07 NOTE — Progress Notes (Signed)
Physical Therapy Treatment Patient Details  Name: [name] MRN: [id number] Date of Birth: 01-Jun-1933  Today's Date: 03/07/2013 Time: 3614-4315 PT Time Calculation (min): 45 min Charges:  NMR: 15' TE: 30' Visit#: 2 of 12  Re-eval: 11/29/12    Authorization: [employer]  Authorization Time Period:    Authorization Visit#: 1 of 10   Subjective: Symptoms/Limitations Symptoms: Pt states she was a little sore after some of the exercises yesterday and is feeling good today.  Pain Assessment Currently in Pain?: Yes Pain Score: 3  Pain Location: Hip Pain Orientation: Right  Precautions/Restrictions     Exercise/Treatments Aerobic Stationary Bike: [place] #3, resistance 3  x 10 minutes w/o UE A for LE strengthening With Steps Per Minute (APM) avg: 79 Standing Knee Flexion: Both;2 sets;10 reps;Limitations Knee Flexion Limitations: 3#, max VC, TC for posture and proper knee flexion (toe touch only) Supine Bridges: 2 sets;15 reps;Limitations Bridges Limitations: isometric ball squeeze, lifts buttock 1/2 in. Off mat Other Supine Knee Exercises: Toe Roll in and outs 2x15 w.VC and TC for proper movements Standing Standing Eyes Opened: Wide (BOA);Foam;1 rep;Time;Narrow base of support (BOS) Standing Eyes Opened Time: WBOS x2 minutes on foam; NBOS x2 minutes on foam Tandem Stance: Eyes open;Intermittent HHA;2 reps;30 secs;Limitations Tandem Stance Limitations: PT facilaition for pelvic alignment Standing, One Foot on a Step: Eyes open;4 inch;2 reps;30 secs;Limitations (BLE) Standing, One Foot on a Step Limitations: Min A w/mirror cueing and TC for proper posture   Physical Therapy Assessment and Plan PT Assessment and Plan Clinical Impression Statement: Set up pt HEP today with balance and therapuetic exercises.  Encourage pt to use a mirror and pillow with a safe place to hold onto. Pt requires max cueing for proper posture and motion.  Demonstrates a poor bridge with  lack of hamstring and gluteal activitation.  PT Duration:  (6 weeks) PT Plan: Primary focus in on balance.  Add prone hip extension and knee flexion (with weight for knee flexion).  add isometric hip abducution.  Continue to progress static standing to use of foam and instruct on proper posture.  Add foot strengthening and continue with hip strengthening activities.     Goals Home Exercise Program Pt/caregiver will Perform Home Exercise Program: For improved balance;For increased strengthening;Independently PT Goal: Perform Home Exercise Program - Progress: Met PT Short Term Goals Time to Complete Short Term Goals: 3 weeks PT Short Term Goal 1: Pt will improve BLE strength by 1 muscle grade in order to stand for greater than 10 minutes with intermittent HHA without a rest break to continue with balance activities.  PT Short Term Goal 1 - Progress: Progressing toward goal PT Short Term Goal 2: Pt will improve her postural strength to [facility] in order to ambulate without trunk flexion.  PT Short Term Goal 2 - Progress: Progressing toward goal PT Short Term Goal 3: Pt will improve her BLE hip IR and ER strength to 4/5 in order to ambulate with decreased Trendelenberg gait.  PT Short Term Goal 3 - Progress: Progressing toward goal PT Short Term Goal 4: Pt will improve her self confidence and proprioceptive awarness to begin ambulating with a SPC in outdoor environment to participate in community activities.  PT Short Term Goal 4 - Progress: Progressing toward goal PT Long Term Goals Time to Complete Long Term Goals:  (6 weeks) PT Long Term Goal 1: Pt will improve her berg balance test to greater than 50/56 to decrease risk of falls. PT Long Term Goal  1 - Progress: Progressing toward goal PT Long Term Goal 2: Pt will improve her gait velocity to greater than 2.05 ft/sec with SPC in order to begin ambulating at the mall for exercises.  PT Long Term Goal 2 - Progress: Progressing toward goal Long Term  Goal 3: Pt will improve her postural strength to [facility] in order to ambulate with forward flexion less than 10 degrees with SPC to improve respiratory function.  Long Term Goal 3 Progress: Progressing toward goal Long Term Goal 4: Pt will improve her FOTO score to limitaion less than 50% for improved percieved functional ability.  Long Term Goal 4 Progress: Progressing toward goal  Problem List Patient Active Problem List   Diagnosis Date Noted  . Bilateral leg weakness 09/23/2012  . Acute renal failure 07/25/2012  . Dehydration 07/25/2012  . DM type 2, uncontrolled, with neuropathy 07/25/2012  . DM (diabetes mellitus), type 2, uncontrolled 07/25/2012  . Essential hypertension, benign 07/25/2012  . Generalized weakness 07/25/2012  . Hyperlipidemia   . Chronic kidney disease   . Skin cancer   . Hypertension   . GERD (gastroesophageal reflux disease)   . Breast cancer 02/19/2012  . CAP (community acquired pneumonia) 04/19/2011  . Nausea and vomiting 04/19/2011  . HTN (hypertension) 04/19/2011  . DM type 2 (diabetes mellitus, type 2) 04/19/2011  . Arthritis 04/19/2011    PT Plan of Care Consulted and Agree with Plan of Care: Patient  GP Functional Assessment Tool Used: FOTO: 40/60  Chesney Suares, MPT, ATC 03/07/2013, 9:46 AM

## 2013-03-10 ENCOUNTER — Ambulatory Visit (HOSPITAL_COMMUNITY)
Admission: RE | Admit: 2013-03-10 | Discharge: 2013-03-10 | Disposition: A | Discharge status: Home / Self Care | Payer: Medicare Other | Source: Ambulatory Visit | Attending: Pulmonary Disease | Admitting: Pulmonary Disease

## 2013-03-10 DIAGNOSIS — C50312 Malignant neoplasm of lower-inner quadrant of left female breast: Secondary | ICD-10-CM | POA: Insufficient documentation

## 2013-03-10 DIAGNOSIS — Z170 Estrogen receptor positive status [ER+]: Secondary | ICD-10-CM

## 2013-03-10 NOTE — Progress Notes (Signed)
Physical Therapy Treatment Patient Details  Name: [name] MRN: [id number] Date of Birth: February 01, 1934  Today's Date: 03/10/2013 Time: 0850-0930 PT Time Calculation (min): 40 min Visit#: 3 of 12  Re-eval: 11/29/12 Authorization: [employer]  Authorization Visit#: 3 of 10  Charges:  therex 38  Subjective: Symptoms/Limitations Symptoms: Pt states she continues to use her rolling walker as she feels unstable.  Currently with pain. Pain Assessment Currently in Pain?: No/denies  Precautions/Restrictions     Exercise/Treatments Stationary Bike: [place] #3, resistance 3  x 10 minutes w/o UE A for LE strengthening With Steps Per Minute (APM) avg: 79 Supine Bridges: 2 sets;15 reps;Limitations Bridges Limitations: isometric ball squeeze Other Supine Knee Exercises: isometric hip abduction with therapist faciliiation 10 reps  5 sec holds Sidelying Hip ABduction: 15 reps Hip ABduction Limitations: AA with Lt LE for proper form Clams: 10 X5" holds Other Sidelying Knee Exercises: Right only Prone  Hamstring Curl: 10 reps;Limitations Hamstring Curl Limitations: 3# each LE Hip Extension: 10 reps;Both Balance Exercises Standing Standing Eyes Opened: Narrow base of support (BOS);Foam;Limitations Standing Eyes Opened Time: with lateral UE movements and fwd flexion 10 reps each Tandem Stance: Eyes open;Foam;1 rep;30 secs SLS: Eyes open;Hand held assist (HHA) 1;2 reps;30 secs Standing, One Foot on a Step: Eyes open;4 inch;2 reps;30 secs;Limitations   Physical Therapy Assessment and Plan PT Assessment and Plan Clinical Impression Statement: Pt able to complete exercises today without rest break.  Increased difficulty of tandem gait to foam today balancing without UE assist.  continues to require cues and therapist facilitation to perform exercises correctly and in good form.  Resumed sidelying hip abductor exercises with therapist assistance for proper mm recruitment.  PT  Duration:  (6 weeks) PT Plan: Primary focus in on balance.   Add foot strengthening and continue with hip strengthening activities.      Problem List Patient Active Problem List   Diagnosis Date Noted  . Bilateral leg weakness 09/23/2012  . Acute renal failure 07/25/2012  . Dehydration 07/25/2012  . DM type 2, uncontrolled, with neuropathy 07/25/2012  . DM (diabetes mellitus), type 2, uncontrolled 07/25/2012  . Essential hypertension, benign 07/25/2012  . Generalized weakness 07/25/2012  . Hyperlipidemia   . Chronic kidney disease   . Skin cancer   . Hypertension   . GERD (gastroesophageal reflux disease)   . Breast cancer 02/19/2012  . CAP (community acquired pneumonia) 04/19/2011  . Nausea and vomiting 04/19/2011  . HTN (hypertension) 04/19/2011  . DM type 2 (diabetes mellitus, type 2) 04/19/2011  . Arthritis 04/19/2011    PT Plan of Care Consulted and Agree with Plan of Care: Patient  GP Functional Assessment Tool Used: FOTO: 40/60  Teena Irani, PTA/CLT 03/10/2013, 12:11 PM

## 2013-03-11 DIAGNOSIS — M8580 Other specified disorders of bone density and structure, unspecified site: Secondary | ICD-10-CM | POA: Insufficient documentation

## 2013-03-12 ENCOUNTER — Ambulatory Visit (HOSPITAL_COMMUNITY): Payer: Self-pay | Admitting: *Deleted

## 2013-03-12 ENCOUNTER — Telehealth (HOSPITAL_COMMUNITY): Payer: Self-pay

## 2013-03-14 ENCOUNTER — Ambulatory Visit (HOSPITAL_COMMUNITY)
Admission: RE | Admit: 2013-03-14 | Discharge: 2013-03-14 | Disposition: A | Discharge status: Home / Self Care | Payer: Medicare Other | Source: Ambulatory Visit | Attending: Pulmonary Disease | Admitting: Pulmonary Disease

## 2013-03-14 DIAGNOSIS — R29898 Other symptoms and signs involving the musculoskeletal system: Secondary | ICD-10-CM

## 2013-03-14 NOTE — Progress Notes (Signed)
Physical Therapy Treatment Patient Details  Name: [name] MRN: [id number] Date of Birth: 05/27/1933  Today's Date: 03/14/2013 Time: 0846-0930 PT Time Calculation (min): 44 min Charges: NMR: 846-915 TE: 915-930 Visit#: 4 of 12  Re-eval: 11/29/12    Authorization: [employer]  Authorization Time Period:    Authorization Visit#: 4 of 10   Subjective: Symptoms/Limitations Symptoms: Pt reports that she took her pain pill this morning and is feeling pretty good.  She is still using her RW due to instability.   Precautions/Restrictions     Exercise/Treatments Aerobic Stationary Bike: [place] #3, resistance 4  x 10 minutes w/o UE A for LE strengthening With Steps Per Minute (APM) avg: 79 Supine Bridges: Both;5 reps;Limitations Bridges Limitations: isometric towel squeeze 10 sec holds Sidelying Hip ABduction: AAROM;Both;5 reps;Limitations Hip ABduction Limitations: AAROM: w/10 quick reps at end range; Forward circles x5 reps, Backwards circles x5, isometric holds 5x5 sec holds Standing Numbers 1-15: Foam;2 reps;Limitations (alternating BUE. ) Other Standing Exercises: Rocker Board Static Stance: A<>P and S<>S x2 minutes w/mod A   Physical Therapy Assessment and Plan PT Assessment and Plan Clinical Impression Statement: Treatment focused on improving balance and balance confidence.  Has more difficulty with proprioceptive awareness with Rt sidestepping. Is able to complete numbers activity with min A.  Continues to requires most assistance with hip abduction exercises and continues to add exercises to recruit fast and slow twitch exercises.  PT Duration:  (6 weeks) PT Plan: Primary focus in on balance.   Add foot strengthening and continue with hip strengthening activities.     Goals Home Exercise Program Pt/caregiver will Perform Home Exercise Program: For improved balance;For increased strengthening;Independently PT Goal: Perform Home Exercise Program - Progress:  Met PT Short Term Goals Time to Complete Short Term Goals: 3 weeks PT Short Term Goal 1: Pt will improve BLE strength by 1 muscle grade in order to stand for greater than 10 minutes with intermittent HHA without a rest break to continue with balance activities.  PT Short Term Goal 1 - Progress: Progressing toward goal PT Short Term Goal 2: Pt will improve her postural strength to WFL in order to ambulate without trunk flexion.  PT Short Term Goal 2 - Progress: Progressing toward goal PT Short Term Goal 3: Pt will improve her BLE hip IR and ER strength to 4/5 in order to ambulate with decreased Trendelenberg gait.  PT Short Term Goal 3 - Progress: Progressing toward goal PT Short Term Goal 4: Pt will improve her self confidence and proprioceptive awarness to begin ambulating with a SPC in outdoor environment to participate in community activities.  PT Short Term Goal 4 - Progress: Progressing toward goal PT Long Term Goals Time to Complete Long Term Goals:  (6 weeks) PT Long Term Goal 1: Pt will improve her berg balance test to greater than 50/56 to decrease risk of falls. PT Long Term Goal 1 - Progress: Progressing toward goal PT Long Term Goal 2: Pt will improve her gait velocity to greater than 2.05 ft/sec with SPC in order to begin ambulating at the mall for exercises.  PT Long Term Goal 2 - Progress: Progressing toward goal Long Term Goal 3: Pt will improve her postural strength to WFL in order to ambulate with forward flexion less than 10 degrees with SPC to improve respiratory function.  Long Term Goal 3 Progress: Progressing toward goal Long Term Goal 4: Pt will improve her FOTO score to limitaion less than 50% for improved percieved   functional ability.  Long Term Goal 4 Progress: Progressing toward goal  Problem List Patient Active Problem List   Diagnosis Date Noted  . Bilateral leg weakness 09/23/2012  . Acute renal failure 07/25/2012  . Dehydration 07/25/2012  . DM type 2,  uncontrolled, with neuropathy 07/25/2012  . DM (diabetes mellitus), type 2, uncontrolled 07/25/2012  . Essential hypertension, benign 07/25/2012  . Generalized weakness 07/25/2012  . Hyperlipidemia   . Chronic kidney disease   . Skin cancer   . Hypertension   . GERD (gastroesophageal reflux disease)   . Breast cancer 02/19/2012  . CAP (community acquired pneumonia) 04/19/2011  . Nausea and vomiting 04/19/2011  . HTN (hypertension) 04/19/2011  . DM type 2 (diabetes mellitus, type 2) 04/19/2011  . Arthritis 04/19/2011    PT Plan of Care PT Patient Instructions: Teach back for body awareness and strategies she can use at home to assist with proprioceptive awareness with mobility.  Consulted and Agree with Plan of Care: Patient  GP Functional Assessment Tool Used: FOTO: 40/60   , MPT 03/14/2013, 9:43 AM  

## 2013-03-17 ENCOUNTER — Ambulatory Visit (HOSPITAL_COMMUNITY)
Admission: RE | Admit: 2013-03-17 | Discharge: 2013-03-17 | Disposition: A | Discharge status: Home / Self Care | Payer: Medicare Other | Source: Ambulatory Visit | Attending: Pulmonary Disease | Admitting: Pulmonary Disease

## 2013-03-17 DIAGNOSIS — R29898 Other symptoms and signs involving the musculoskeletal system: Secondary | ICD-10-CM

## 2013-03-17 NOTE — Progress Notes (Signed)
Physical Therapy Treatment Patient Details  Name: [name] MRN: [id number] Date of Birth: 1933-04-17  Today's Date: 03/17/2013 Time: [id number] PT Time Calculation (min): 44 min Charge NMR [id number], TE [id number]  Visit#: 5 of 12  Re-eval: 11/29/12    Authorization: [employer]  Authorization Time Period:    Authorization Visit#: 5 of 10   Subjective: Symptoms/Limitations Symptoms: Pain free today, balance is improving some, surprised it would talk so long to regain balance.   Pain Assessment Currently in Pain?: No/denies  Objective:   Exercise/Treatments Standing Rocker Board: 3 minutes;Limitations Rocker Board Limitations: static R/L and A/P 3 minutes static Other Standing Knee Exercises: Hip hike from floor level x 15 bilateral; Tandem stance 1'x2 bilateral; SLS with opposite foot on 4" step 1'x2 bilateral Other Standing Knee Exercises: Standing on airex feet side by side with perterbation x 1 minute Seated Other Seated Knee Exercises: Towel crunch 3 min each foot Other Seated Knee Exercises: Marble pick up  Balance Exercises Standing Standing Eyes Opened: Narrow base of support (BOS);2 reps;Time Balance Beam: Tandem and sidestepping 1RT Other Standing Exercises: Rocker Board Static Stance: A<>P and S<>S x2 minutes w/mod A   Physical Therapy Assessment and Plan PT Assessment and Plan Clinical Impression Statement: Treatment focus on improving balance confidence and began foot and ankle exercises to improve gait mechanics.  Progressed balance activities with dynamic surface with min-mod assistance required with change in surface.  Pt continues to require moderate assistance with hip musculature due to weakness.  Noted weak intrinsic mm Bil feet, required increased time to complete towel crunch and marble pick up.   PT Plan: Primary focus in on balance.   Add foot strengthening and continue with hip strengthening activities. Next session begin inversion/eversion  and red theraband for ankle strengthening    Goals Home Exercise Program Pt/caregiver will Perform Home Exercise Program: For improved balance;For increased strengthening;Independently PT Short Term Goals Time to Complete Short Term Goals: 3 weeks PT Short Term Goal 1: Pt will improve BLE strength by 1 muscle grade in order to stand for greater than 10 minutes with intermittent HHA without a rest break to continue with balance activities.  PT Short Term Goal 1 - Progress: Progressing toward goal PT Short Term Goal 2: Pt will improve her postural strength to [facility] in order to ambulate without trunk flexion.  PT Short Term Goal 2 - Progress: Progressing toward goal PT Short Term Goal 3: Pt will improve her BLE hip IR and ER strength to 4/5 in order to ambulate with decreased Trendelenberg gait.  PT Short Term Goal 4: Pt will improve her self confidence and proprioceptive awarness to begin ambulating with a SPC in outdoor environment to participate in community activities.  PT Long Term Goals Time to Complete Long Term Goals:  (6 weeks) PT Long Term Goal 1: Pt will improve her berg balance test to greater than 50/56 to decrease risk of falls. PT Long Term Goal 1 - Progress: Progressing toward goal PT Long Term Goal 2: Pt will improve her gait velocity to greater than 2.05 ft/sec with SPC in order to begin ambulating at the mall for exercises.  Long Term Goal 3: Pt will improve her postural strength to [facility] in order to ambulate with forward flexion less than 10 degrees with SPC to improve respiratory function.  Long Term Goal 4: Pt will improve her FOTO score to limitaion less than 50% for improved percieved functional ability.   Problem List Patient Active Problem List  Diagnosis Date Noted  . Bilateral leg weakness 09/23/2012  . Acute renal failure 07/25/2012  . Dehydration 07/25/2012  . DM type 2, uncontrolled, with neuropathy 07/25/2012  . DM (diabetes mellitus), type 2, uncontrolled  07/25/2012  . Essential hypertension, benign 07/25/2012  . Generalized weakness 07/25/2012  . Hyperlipidemia   . Chronic kidney disease   . Skin cancer   . Hypertension   . GERD (gastroesophageal reflux disease)   . Breast cancer 02/19/2012  . CAP (community acquired pneumonia) 04/19/2011  . Nausea and vomiting 04/19/2011  . HTN (hypertension) 04/19/2011  . DM type 2 (diabetes mellitus, type 2) 04/19/2011  . Arthritis 04/19/2011    PT - End of Session Activity Tolerance: Patient tolerated treatment well General Behavior During Therapy: [facility] for tasks assessed/performed  GP    Aldona Lento 03/17/2013, 11:56 AM

## 2013-03-18 ENCOUNTER — Encounter (INDEPENDENT_AMBULATORY_CARE_PROVIDER_SITE_OTHER): Payer: Self-pay | Admitting: General Surgery

## 2013-03-19 ENCOUNTER — Ambulatory Visit (HOSPITAL_COMMUNITY)
Admission: RE | Admit: 2013-03-19 | Discharge: 2013-03-19 | Disposition: A | Discharge status: Home / Self Care | Payer: Medicare Other | Source: Ambulatory Visit | Attending: Pulmonary Disease | Admitting: Pulmonary Disease

## 2013-03-19 DIAGNOSIS — R29898 Other symptoms and signs involving the musculoskeletal system: Secondary | ICD-10-CM

## 2013-03-19 NOTE — Progress Notes (Signed)
Physical Therapy Treatment Patient Details  Name: [name] MRN: [id number] Date of Birth: 03-25-1933  Today's Date: 03/19/2013 Time: [id number] PT Time Calculation (min): 48 min Charge ;NMR [id number], TE [id number]  Visit#: 6 of 12  Re-eval: 04/02/13 Assessment Diagnosis: S/P back surgery Surgical Date: 06/27/12 Next MD Visit: Vertell Limber - February Prior Therapy: SNF  Authorization: [employer]med[employer]  Authorization Time Period:    Authorization Visit#: 6 of 10   Subjective: Symptoms/Limitations Symptoms: Pain free today, surprised how difficult the foot exercises,  Attempted the towel crunches at home Pain Assessment Currently in Pain?: No/denies  Precautions/Restrictions  Precautions Precautions: Fall  Exercise/Treatments Standing Rocker Board: 3 minutes;Limitations Rocker Board Limitations: static R/L and A/P 3 minutes static Seated Other Seated Knee Exercises: Towel crunch 5 min each foot Other Seated Knee Exercises: 10 Marble pick up; inversion/eversion 3reps Bil LE, red theraband 10x each direction Bil LE.   Physical Therapy Assessment and Plan PT Assessment and Plan Clinical Impression Statement: Added ankle exercises for LE strengthening to improve coordination and balance with gait.  Pt required PT facilitation to improve ankle movements to reduce compensation with hip IR and ER.  Pt with decreased coordination required mod assistance and cueing  Pt given worksheet to complete theraband exercises at home.  No reports of pain through session. PT Plan: Primary focus in on balance.   Continue with foot and hip strengthening activities.      Goals Home Exercise Program Pt/caregiver will Perform Home Exercise Program: For improved balance;For increased strengthening;Independently PT Short Term Goals Time to Complete Short Term Goals: 3 weeks PT Short Term Goal 1: Pt will improve BLE strength by 1 muscle grade in order to stand for greater than 10 minutes with  intermittent HHA without a rest break to continue with balance activities.  PT Short Term Goal 1 - Progress: Progressing toward goal PT Short Term Goal 2: Pt will improve her postural strength to [facility] in order to ambulate without trunk flexion.  PT Short Term Goal 3: Pt will improve her BLE hip IR and ER strength to 4/5 in order to ambulate with decreased Trendelenberg gait.  PT Short Term Goal 3 - Progress: Progressing toward goal PT Short Term Goal 4: Pt will improve her self confidence and proprioceptive awarness to begin ambulating with a SPC in outdoor environment to participate in community activities.  PT Short Term Goal 4 - Progress: Progressing toward goal PT Long Term Goals Time to Complete Long Term Goals:  (6 weeks) PT Long Term Goal 1: Pt will improve her berg balance test to greater than 50/56 to decrease risk of falls. PT Long Term Goal 1 - Progress: Progressing toward goal PT Long Term Goal 2: Pt will improve her gait velocity to greater than 2.05 ft/sec with SPC in order to begin ambulating at the mall for exercises.  Long Term Goal 3: Pt will improve her postural strength to [facility] in order to ambulate with forward flexion less than 10 degrees with SPC to improve respiratory function.  Long Term Goal 4: Pt will improve her FOTO score to limitaion less than 50% for improved percieved functional ability.   Problem List Patient Active Problem List   Diagnosis Date Noted  . Bilateral leg weakness 09/23/2012  . Acute renal failure 07/25/2012  . Dehydration 07/25/2012  . DM type 2, uncontrolled, with neuropathy 07/25/2012  . DM (diabetes mellitus), type 2, uncontrolled 07/25/2012  . Essential hypertension, benign 07/25/2012  . Generalized weakness 07/25/2012  . Hyperlipidemia   .  Chronic kidney disease   . Skin cancer   . Hypertension   . GERD (gastroesophageal reflux disease)   . Breast cancer 02/19/2012  . CAP (community acquired pneumonia) 04/19/2011  . Nausea and vomiting  04/19/2011  . HTN (hypertension) 04/19/2011  . DM type 2 (diabetes mellitus, type 2) 04/19/2011  . Arthritis 04/19/2011    PT - End of Session Activity Tolerance: Patient tolerated treatment well General Behavior During Therapy: [facility] for tasks assessed/performed  GP    Aldona Lento 03/19/2013, 12:20 PM

## 2013-03-21 ENCOUNTER — Ambulatory Visit (HOSPITAL_COMMUNITY)
Admission: RE | Admit: 2013-03-21 | Discharge: 2013-03-21 | Disposition: A | Discharge status: Home / Self Care | Payer: Medicare Other | Source: Ambulatory Visit | Attending: Pulmonary Disease | Admitting: Pulmonary Disease

## 2013-03-21 DIAGNOSIS — R29898 Other symptoms and signs involving the musculoskeletal system: Secondary | ICD-10-CM

## 2013-03-21 NOTE — Progress Notes (Signed)
Physical Therapy Treatment Patient Details  Name: [name] MRN: [id number] Date of Birth: 1933-12-01  Today's Date: 03/21/2013 Time: 4431-5400 PT Time Calculation (min): 89 min [id number]  TE  [id number] NMR Visit#: 7 of 12  Re-eval: 04/02/13 Assessment Diagnosis: S/P back surgery Surgical Date: 06/27/12 Next MD Visit: Vertell Limber - February  Authorization: [employer]  Authorization Time Period:    Authorization Visit#: 7 of 10   Subjective: Symptoms/Limitations Symptoms: balance main problem  Pain Assessment Pain Score: 0-No pain  Precautions/Restrictions     Exercise/Treatment        Aerobic NU step L3 10 min for strength LE         Supine Bridges: Strengthening;10 reps Other Supine Knee Exercises: Toe Roll in and outs 2x15 w.VC and TC for proper movements Other Supine Knee Exercises: supine B clams with red t band 20x , supine left hip abduction 15x  Sidelying Hip ABduction: AAROM;Both;Limitations;10 reps Clams: 15 eps      Balance Exercises Standing Standing Eyes Opened: Narrow base of support (BOS);2 reps;Time Standing Eyes Opened Time:  (mirror for posture cues ) SLS: Eyes open;Hand held assist (HHA) 1;30 secs;2 reps;10 secs Standing, One Foot on a Step: 6 inch;10 secs;Eyes open;2 reps Sidestepping: 2 reps  Yoga Poses               Physical Therapy Assessment and Plan PT Assessment and Plan Clinical Impression Statement: notable left hip weakness causing sever left trunk lean trendelburg gait during left stacne, walker greatly helps gait and decreased strain on lumbar spine  PT Plan: Primary focus in on balance.   Continue with foot and hip strengthening activities.      Goals Home Exercise Program Pt/caregiver will Perform Home Exercise Program: For improved balance;For increased strengthening;Independently PT Goal: Perform Home Exercise Program - Progress: Progressing toward goal PT Short Term Goals Time to Complete Short Term Goals:  3 weeks PT Short Term Goal 1: Pt will improve BLE strength by 1 muscle grade in order to stand for greater than 10 minutes with intermittent HHA without a rest break to continue with balance activities.  PT Short Term Goal 1 - Progress: Progressing toward goal PT Short Term Goal 2: Pt will improve her postural strength to [facility] in order to ambulate without trunk flexion.  PT Short Term Goal 2 - Progress: Not met PT Short Term Goal 3: Pt will improve her BLE hip IR and ER strength to 4/5 in order to ambulate with decreased Trendelenberg gait.  PT Short Term Goal 3 - Progress: Progressing toward goal PT Short Term Goal 4: Pt will improve her self confidence and proprioceptive awarness to begin ambulating with a SPC in outdoor environment to participate in community activities.  PT Long Term Goals Time to Complete Long Term Goals:  (6 weeks) PT Long Term Goal 1: Pt will improve her berg balance test to greater than 50/56 to decrease risk of falls. PT Long Term Goal 2: Pt will improve her gait velocity to greater than 2.05 ft/sec with SPC in order to begin ambulating [place] the mall for exercises.  Long Term Goal 3: Pt will improve her postural strength to [facility] in order to ambulate with forward flexion less than 10 degrees with SPC to improve respiratory function.  Long Term Goal 4: Pt will improve her FOTO score to limitaion less than 50% for improved percieved functional ability.   Problem List   PT - End of Session Activity Tolerance: Patient tolerated treatment well General  Behavior During Therapy: [facility] for tasks assessed/performed PT Plan of Care PT Patient Instructions: red t band provided for supine calms/ hip Ext rotation   GP    [name] 03/21/2013, 9:35 AM

## 2013-03-24 ENCOUNTER — Ambulatory Visit (HOSPITAL_COMMUNITY)
Admission: RE | Admit: 2013-03-24 | Discharge: 2013-03-24 | Disposition: A | Discharge status: Home / Self Care | Payer: Medicare Other | Source: Ambulatory Visit | Attending: Pulmonary Disease | Admitting: Pulmonary Disease

## 2013-03-24 DIAGNOSIS — R29898 Other symptoms and signs involving the musculoskeletal system: Secondary | ICD-10-CM

## 2013-03-24 NOTE — Progress Notes (Signed)
Physical Therapy Treatment Patient Details  Name: [name] MRN: [id number] Date of Birth: 06/08/1933  Today's Date: 03/24/2013 Time: [id number] PT Time Calculation (min): 63 min Charge;TE [id number], NMR [id number], Gait [id number]  Visit#: 8 of 12  Re-eval: 04/02/13 Assessment Diagnosis: S/P back surgery Surgical Date: 06/27/12 Next MD Visit: Vertell Limber - February 12th Prior Therapy: SNF  Authorization: [employer]  Authorization Time Period:    Authorization Visit#: 8 of 10   Subjective: Symptoms/Limitations Symptoms: Pt reported increase ease with gait, feels her balance is improving.  Pain free today. Pain Assessment Currently in Pain?: No/denies  Precautions/Restrictions  Precautions Precautions: Fall  Exercise/Treatments Aerobic [place]: Gait training with vcing for foot placement and posture 1.2 x 8 min Standing Rocker Board: 3 minutes;Limitations Rocker Board Limitations: static R/L and A/P 3 minutes static Seated Other Seated Knee Exercises: Towel crunch 5 min each foot Other Seated Knee Exercises: 10 [place] pick up; inversion/eversion 3reps Bil LE, Toe yoga moves Bil feet to improve coordination  Balance Exercises Standing Standing Eyes Opened: Narrow base of support (BOS);2 reps;Time Standing, One Foot on a Step: 6 inch;2 reps;Time Standing, One Foot on a Step Time: 60" with min assistance and  visual cueing for posture; gentle perterbation   Physical Therapy Assessment and Plan PT Assessment and Plan Clinical Impression Statement: Session focus on static balance training with visual cueing with mirror to improve posture and exercises complete to improve foot strength and coordination.  Added toe yoga moves to improve coordination with multimodal cueing required PT Plan: Primary focus in on balance.   Continue with foot and hip strengthening activities.      Goals Home Exercise Program Pt/caregiver will Perform Home Exercise Program: For improved  balance;For increased strengthening;Independently PT Short Term Goals Time to Complete Short Term Goals: 3 weeks PT Short Term Goal 1: Pt will improve BLE strength by 1 muscle grade in order to stand for greater than 10 minutes with intermittent HHA without a rest break to continue with balance activities.  PT Short Term Goal 1 - Progress: Progressing toward goal PT Short Term Goal 2: Pt will improve her postural strength to [facility] in order to ambulate without trunk flexion.  PT Short Term Goal 3: Pt will improve her BLE hip IR and ER strength to 4/5 in order to ambulate with decreased Trendelenberg gait.  PT Short Term Goal 4: Pt will improve her self confidence and proprioceptive awarness to begin ambulating with a SPC in outdoor environment to participate in community activities.  PT Long Term Goals Time to Complete Long Term Goals:  (6 weeks) PT Long Term Goal 1: Pt will improve her berg balance test to greater than 50/56 to decrease risk of falls. PT Long Term Goal 1 - Progress: Progressing toward goal PT Long Term Goal 2: Pt will improve her gait velocity to greater than 2.05 ft/sec with SPC in order to begin ambulating at the mall for exercises.  PT Long Term Goal 2 - Progress: Progressing toward goal Long Term Goal 3: Pt will improve her postural strength to [facility] in order to ambulate with forward flexion less than 10 degrees with SPC to improve respiratory function.  Long Term Goal 4: Pt will improve her FOTO score to limitaion less than 50% for improved percieved functional ability.   Problem List Patient Active Problem List   Diagnosis Date Noted  . Bilateral leg weakness 09/23/2012  . Acute renal failure 07/25/2012  . Dehydration 07/25/2012  . DM type 2, uncontrolled,  with neuropathy 07/25/2012  . DM (diabetes mellitus), type 2, uncontrolled 07/25/2012  . Essential hypertension, benign 07/25/2012  . Generalized weakness 07/25/2012  . Hyperlipidemia   . Chronic kidney disease   .  Skin cancer   . Hypertension   . GERD (gastroesophageal reflux disease)   . Breast cancer 02/19/2012  . CAP (community acquired pneumonia) 04/19/2011  . Nausea and vomiting 04/19/2011  . HTN (hypertension) 04/19/2011  . DM type 2 (diabetes mellitus, type 2) 04/19/2011  . Arthritis 04/19/2011    PT - End of Session Activity Tolerance: Patient tolerated treatment well General Behavior During Therapy: [facility] for tasks assessed/performed  GP    Aldona Lento 03/24/2013, 12:22 PM

## 2013-03-26 ENCOUNTER — Ambulatory Visit (HOSPITAL_COMMUNITY)
Admission: RE | Admit: 2013-03-26 | Discharge: 2013-03-26 | Disposition: A | Discharge status: Home / Self Care | Payer: Medicare Other | Source: Ambulatory Visit | Attending: Pulmonary Disease | Admitting: Pulmonary Disease

## 2013-03-26 DIAGNOSIS — R29898 Other symptoms and signs involving the musculoskeletal system: Secondary | ICD-10-CM

## 2013-03-26 NOTE — Progress Notes (Signed)
Physical Therapy Treatment Patient Details  Name: [name] MRN: [id number] Date of Birth: 06-19-1933  Today's Date: 03/26/2013 Time: 0850-0930 PT Time Calculation (min): 40 min Charge: NMR [id number], TE [id number]  Visit#: 9 of 12  Re-eval: 04/02/13    Authorization: [employer]  Authorization Time Period:    Authorization Visit#: 9 of 10   Subjective: Symptoms/Limitations Symptoms: Pt feels like her toes coordination is improving,  Pain Assessment Currently in Pain?: No/denies  Precautions/Restrictions  Precautions Precautions: Fall  Exercise/Treatments Balance Exercises Standing Standing Eyes Opened: Narrow base of support (BOS);2 reps;Time Standing Eyes Opened Time: with perturbation all directions, lateral UE movements and fwd flexion 10 reps each Standing, One Foot on a Step: 6 inch;2 reps;Time Standing, One Foot on a Step Time: 60" with min assistance and  visual cueing for posture; gentle perterbation Wall Bumps: Shoulder;Hips;Eyes opened;10 reps Other Standing Exercises: Special educational needs teacher Stance: A<>P and S<>S x3 minutes w/mod A   Physical Therapy Assessment and Plan PT Assessment and Plan Clinical Impression Statement: Added wall bumps for balance training to reduce fear of falling and to improve core strength.  Pt improving coordination with toe movement Rt>Lt with therapist facilitaiton. PT Plan: Gcode due next session.  Primary focus in on balance.   Continue with foot and hip strengthening activities.      Goals Home Exercise Program Pt/caregiver will Perform Home Exercise Program: For improved balance;For increased strengthening;Independently PT Short Term Goals Time to Complete Short Term Goals: 3 weeks PT Short Term Goal 1: Pt will improve BLE strength by 1 muscle grade in order to stand for greater than 10 minutes with intermittent HHA without a rest break to continue with balance activities.  PT Short Term Goal 1 - Progress: Progressing  toward goal PT Short Term Goal 2: Pt will improve her postural strength to [facility] in order to ambulate without trunk flexion.  PT Short Term Goal 2 - Progress: Progressing toward goal PT Short Term Goal 3: Pt will improve her BLE hip IR and ER strength to 4/5 in order to ambulate with decreased Trendelenberg gait.  PT Short Term Goal 4: Pt will improve her self confidence and proprioceptive awarness to begin ambulating with a SPC in outdoor environment to participate in community activities.  PT Long Term Goals Time to Complete Long Term Goals:  (6 weeks) PT Long Term Goal 1: Pt will improve her berg balance test to greater than 50/56 to decrease risk of falls. PT Long Term Goal 1 - Progress: Progressing toward goal PT Long Term Goal 2: Pt will improve her gait velocity to greater than 2.05 ft/sec with SPC in order to begin ambulating at the mall for exercises.  PT Long Term Goal 2 - Progress: Progressing toward goal Long Term Goal 3: Pt will improve her postural strength to [facility] in order to ambulate with forward flexion less than 10 degrees with SPC to improve respiratory function.  Long Term Goal 3 Progress: Progressing toward goal Long Term Goal 4: Pt will improve her FOTO score to limitaion less than 50% for improved percieved functional ability.   Problem List Patient Active Problem List   Diagnosis Date Noted  . Bilateral leg weakness 09/23/2012  . Acute renal failure 07/25/2012  . Dehydration 07/25/2012  . DM type 2, uncontrolled, with neuropathy 07/25/2012  . DM (diabetes mellitus), type 2, uncontrolled 07/25/2012  . Essential hypertension, benign 07/25/2012  . Generalized weakness 07/25/2012  . Hyperlipidemia   . Chronic kidney disease   .  Skin cancer   . Hypertension   . GERD (gastroesophageal reflux disease)   . Breast cancer 02/19/2012  . CAP (community acquired pneumonia) 04/19/2011  . Nausea and vomiting 04/19/2011  . HTN (hypertension) 04/19/2011  . DM type 2 (diabetes  mellitus, type 2) 04/19/2011  . Arthritis 04/19/2011    PT - End of Session Activity Tolerance: Patient tolerated treatment well General Behavior During Therapy: [facility] for tasks assessed/performed  GP    Aldona Lento 03/26/2013, 1:10 PM

## 2013-03-28 ENCOUNTER — Ambulatory Visit (HOSPITAL_COMMUNITY)
Admission: RE | Admit: 2013-03-28 | Discharge: 2013-03-28 | Disposition: A | Discharge status: Home / Self Care | Payer: Medicare Other | Source: Ambulatory Visit | Attending: Pulmonary Disease | Admitting: Pulmonary Disease

## 2013-03-28 DIAGNOSIS — R29898 Other symptoms and signs involving the musculoskeletal system: Secondary | ICD-10-CM

## 2013-03-28 NOTE — Progress Notes (Signed)
Physical Therapy Treatment Patient Details  Name: [name] MRN: [id number] Date of Birth: [birth year]  Today's Date: 03/28/2013 Time: 0850-0940 PT Time Calculation (min): 50 min Charge:  There ex [id number]; neuro reed [id number] Visit#: 10 of 12  Re-eval: 04/02/13    Authorization: [employer]   Authorization Visit#: 10 of 20   Subjective: Symptoms/Limitations Symptoms: Pt states that shecan tell that she is doing better. Pain Assessment Currently in Pain?: No/denies    Exercise/Treatments   Standing Tandem Stance: Eyes open;2 reps;30 secs Other Standing Exercises: Rocker Board Static Stance: A<>P and S<>S x3 minutes w/mod A Other Standing Exercises: side step x 1 RT; stand against wall B shld flex; hip abduction B against wall x 10 ; retro walking x 1 RT      Seated Other Seated Exercises: sit to stand with Rt leg in back x 10; Lt x 10 ; nustep L4 x 10:00 Other Seated Exercises: B sidelying clams, abduction, forward and backward circles x 10 @       Physical Therapy Assessment and Plan PT Assessment and Plan Clinical Impression Statement: Pt continues to state that she has a fear of falling.  Thereapis added strengthening for improved stability. All exercises were facilitated by therapist to keep pt's body in proper alignment.  PT Plan: continue with balance and strengthening exercises.    Goals  progressing  Problem List Patient Active Problem List   Diagnosis Date Noted  . Bilateral leg weakness 09/23/2012  . Acute renal failure 07/25/2012  . Dehydration 07/25/2012  . DM type 2, uncontrolled, with neuropathy 07/25/2012  . DM (diabetes mellitus), type 2, uncontrolled 07/25/2012  . Essential hypertension, benign 07/25/2012  . Generalized weakness 07/25/2012  . Hyperlipidemia   . Chronic kidney disease   . Skin cancer   . Hypertension   . GERD (gastroesophageal reflux disease)   . Breast cancer 02/19/2012  . CAP (community acquired pneumonia) 04/19/2011   . Nausea and vomiting 04/19/2011  . HTN (hypertension) 04/19/2011  . DM type 2 (diabetes mellitus, type 2) 04/19/2011  . Arthritis 04/19/2011    PT - End of Session Activity Tolerance: Patient tolerated treatment well General Behavior During Therapy: WFL for tasks assessed/performed  GP Functional Assessment Tool Used: foto 41/60 Functional Limitation: Other PT primary Self Care Current Status [id number]): At least 40 percent but less than 60 percent impaired, limited or restricted Self Care Goal Status [id number]): At least 40 percent but less than 60 percent impaired, limited or restricted  RUSSELL,CINDY 03/28/2013, 3:58 PM

## 2013-03-31 ENCOUNTER — Ambulatory Visit (HOSPITAL_COMMUNITY)
Admission: RE | Admit: 2013-03-31 | Discharge: 2013-03-31 | Disposition: A | Discharge status: Home / Self Care | Payer: Medicare Other | Source: Ambulatory Visit | Attending: Pulmonary Disease | Admitting: Pulmonary Disease

## 2013-03-31 DIAGNOSIS — M6281 Muscle weakness (generalized): Secondary | ICD-10-CM | POA: Insufficient documentation

## 2013-03-31 DIAGNOSIS — I10 Essential (primary) hypertension: Secondary | ICD-10-CM | POA: Insufficient documentation

## 2013-03-31 DIAGNOSIS — R29898 Other symptoms and signs involving the musculoskeletal system: Secondary | ICD-10-CM

## 2013-03-31 DIAGNOSIS — R269 Unspecified abnormalities of gait and mobility: Secondary | ICD-10-CM | POA: Insufficient documentation

## 2013-03-31 DIAGNOSIS — IMO0001 Reserved for inherently not codable concepts without codable children: Secondary | ICD-10-CM | POA: Insufficient documentation

## 2013-03-31 DIAGNOSIS — R262 Difficulty in walking, not elsewhere classified: Secondary | ICD-10-CM | POA: Insufficient documentation

## 2013-03-31 DIAGNOSIS — M47817 Spondylosis without myelopathy or radiculopathy, lumbosacral region: Secondary | ICD-10-CM | POA: Insufficient documentation

## 2013-03-31 DIAGNOSIS — E119 Type 2 diabetes mellitus without complications: Secondary | ICD-10-CM | POA: Insufficient documentation

## 2013-03-31 NOTE — Progress Notes (Signed)
Physical Therapy Treatment Patient Details  Name: [name] MRN: [id number] Date of Birth: 03/04/1933  Today's Date: 03/31/2013 Time: [id number] PT Time Calculation (min): 48 min Charge: NMR [id number], TE [id number]  Visit#: 11 of 12  Re-eval: 04/02/13 Assessment Diagnosis: S/P back surgery Surgical Date: 06/27/12 Next MD Visit: Vertell Limber - February 12th Prior Therapy: SNF  Authorization: [employer]  Authorization Time Period:    Authorization Visit#: 11 of 20   Subjective: Symptoms/Limitations Symptoms: Pain free, reports increased ease with balance activtiies while walking currently, still ambulates with walker Pain Assessment Currently in Pain?: No/denies  Precautions/Restrictions  Precautions Precautions: Fall  Exercise/Treatments Aerobic Stationary Bike: [place] #3, resistance 4  x 10 minutes w/o UE A for LE strengthening With Steps Per Minute (APM) avg: 100 Sidelying Clams: Reverse clam 10 x10"    Balance Exercises Standing Tandem Stance: Eyes open;2 reps;Time;Limitations Tandem Stance Time: 1 minute hold on 6in step no HHA Other Standing Exercises: Special educational needs teacher Stance: A<>P and S<>S x3 minutes w/mod A Other Standing Exercises: side step x 1 RT; stand against wall B shld flex; hip abduction B against wall x 10 ; retro walking x 1 RT    Physical Therapy Assessment and Plan PT Assessment and Plan Clinical Impression Statement: Pt improving static balance and stability, still requires facilittation by therapist to improve form and exercises complete in proper alignment.  Exercises added to improve hip internal and external rotation strength with AAROM required due to weakness. PT Plan: Re-eval next session, continue with balance and strengthening exercises.    Goals Home Exercise Program Pt/caregiver will Perform Home Exercise Program: For improved balance;For increased strengthening;Independently PT Short Term Goals Time to Complete Short Term  Goals: 3 weeks PT Short Term Goal 1: Pt will improve BLE strength by 1 muscle grade in order to stand for greater than 10 minutes with intermittent HHA without a rest break to continue with balance activities.  PT Short Term Goal 1 - Progress: Progressing toward goal PT Short Term Goal 2: Pt will improve her postural strength to [facility] in order to ambulate without trunk flexion.  PT Short Term Goal 2 - Progress: Progressing toward goal PT Short Term Goal 3: Pt will improve her BLE hip IR and ER strength to 4/5 in order to ambulate with decreased Trendelenberg gait.  PT Short Term Goal 3 - Progress: Progressing toward goal PT Short Term Goal 4: Pt will improve her self confidence and proprioceptive awarness to begin ambulating with a SPC in outdoor environment to participate in community activities.  PT Long Term Goals Time to Complete Long Term Goals:  (6 weeks) PT Long Term Goal 1: Pt will improve her berg balance test to greater than 50/56 to decrease risk of falls. PT Long Term Goal 1 - Progress: Progressing toward goal PT Long Term Goal 2: Pt will improve her gait velocity to greater than 2.05 ft/sec with SPC in order to begin ambulating at the mall for exercises.  Long Term Goal 3: Pt will improve her postural strength to [facility] in order to ambulate with forward flexion less than 10 degrees with SPC to improve respiratory function.  Long Term Goal 3 Progress: Progressing toward goal Long Term Goal 4: Pt will improve her FOTO score to limitaion less than 50% for improved percieved functional ability.   Problem List Patient Active Problem List   Diagnosis Date Noted  . Bilateral leg weakness 09/23/2012  . Acute renal failure 07/25/2012  . Dehydration 07/25/2012  .  DM type 2, uncontrolled, with neuropathy 07/25/2012  . DM (diabetes mellitus), type 2, uncontrolled 07/25/2012  . Essential hypertension, benign 07/25/2012  . Generalized weakness 07/25/2012  . Hyperlipidemia   . Chronic kidney  disease   . Skin cancer   . Hypertension   . GERD (gastroesophageal reflux disease)   . Breast cancer 02/19/2012  . CAP (community acquired pneumonia) 04/19/2011  . Nausea and vomiting 04/19/2011  . HTN (hypertension) 04/19/2011  . DM type 2 (diabetes mellitus, type 2) 04/19/2011  . Arthritis 04/19/2011    PT - End of Session Equipment Utilized During Treatment: Gait belt Activity Tolerance: Patient tolerated treatment well General Behavior During Therapy: [facility] for tasks assessed/performed  GP    Aldona Lento 03/31/2013, 12:55 PM

## 2013-04-02 ENCOUNTER — Ambulatory Visit (HOSPITAL_COMMUNITY)
Admission: RE | Admit: 2013-04-02 | Discharge: 2013-04-02 | Disposition: A | Discharge status: Home / Self Care | Payer: Medicare Other | Source: Ambulatory Visit | Attending: Pulmonary Disease | Admitting: Pulmonary Disease

## 2013-04-02 NOTE — Progress Notes (Signed)
Physical Therapy Treatment Patient Details  Name: [name] MRN: [id number] Date of Birth: 1933/09/17  Today's Date: 04/02/2013 Time: [id number] PT Time Calculation (min): 45 min  Visit#: 12 of 12  Re-eval: 04/02/13 Authorization: [employer]  Authorization Visit#: 12 of 20 Charges:  therex [id number] (40')  Subjective:  Pt states she's been doing her exercises at home. Continues to use her All-wheel walker.   Exercise/Treatments Balance Exercises Standing Other Standing Exercises: Special educational needs teacher Stance: A<>P and S<>S x3 minutes w/mod A Other Standing Exercises: side step x 1 RT; stand against wall B shld flex; hip abduction B against wall x 10 ; retro walking x 1 RT   Seated Other Seated Exercises: sit to stand with Rt leg in back x 10; LT X 10 Other Seated Exercises: B sidelying clams, abduction, forward and backward circles x 10 @  Nustep 10 minutes level 4 hills #3 LE only    Physical Therapy Assessment and Plan PT Assessment and Plan Clinical Impression Statement: Pt improving with balance and stabilty, however continues with extreme hip weakness and trendelenburg gait.  Pt requires tactile cues to perform exercises with improved form.  Pt with most difficulty performing backward RtLE circles due to weakness.  Pt returns to MD next week.   PT Plan: Re-eval next session, continue with balance and strengthening exercises.     Problem List Patient Active Problem List   Diagnosis Date Noted  . Bilateral leg weakness 09/23/2012  . Acute renal failure 07/25/2012  . Dehydration 07/25/2012  . DM type 2, uncontrolled, with neuropathy 07/25/2012  . DM (diabetes mellitus), type 2, uncontrolled 07/25/2012  . Essential hypertension, benign 07/25/2012  . Generalized weakness 07/25/2012  . Hyperlipidemia   . Chronic kidney disease   . Skin cancer   . Hypertension   . GERD (gastroesophageal reflux disease)   . Breast cancer 02/19/2012  . CAP (community acquired  pneumonia) 04/19/2011  . Nausea and vomiting 04/19/2011  . HTN (hypertension) 04/19/2011  . DM type 2 (diabetes mellitus, type 2) 04/19/2011  . Arthritis 04/19/2011    PT - End of Session Equipment Utilized During Treatment: Gait belt Activity Tolerance: Patient tolerated treatment well General Behavior During Therapy: [facility] for tasks assessed/performed   Teena Irani, PTA/CLT 04/02/2013, 10:13 AM

## 2013-04-04 ENCOUNTER — Ambulatory Visit (HOSPITAL_COMMUNITY)
Admission: RE | Admit: 2013-04-04 | Discharge: 2013-04-04 | Disposition: A | Discharge status: Home / Self Care | Payer: Medicare Other | Source: Ambulatory Visit | Attending: Pulmonary Disease | Admitting: Pulmonary Disease

## 2013-04-04 DIAGNOSIS — R29898 Other symptoms and signs involving the musculoskeletal system: Secondary | ICD-10-CM

## 2013-04-04 NOTE — Evaluation (Addendum)
Physical Therapy Evaluation  Patient Details  Name: [name] MRN: [id number] Date of Birth: 08-Jul-1933  Today's Date: 04/04/2013 Time: 4098-1191 PT Time Calculation (min): 46 min Charge:  Mm test [phone]; physical performance 4782-956             Visit#: 13 of 24  Re-eval: 05/04/13 Assessment Diagnosis: S/P back surgery Surgical Date: 06/27/12 Next MD Visit: Vertell Limber - February  Authorization: [employer]    Authorization Time Period:    Authorization Visit#: 77 of 20   Past Medical History:  Past Medical History  Diagnosis Date  . Diabetes mellitus   . Arthritis   . Hyperlipidemia   . Mental disorder   . Chronic kidney disease     renal stones, one episode of Lithotripsy  . GERD (gastroesophageal reflux disease)   . Skin cancer     face- melanoma, 2012- surg. excision    . Breast cancer, left     er/pr +  . Hypertension     followed by [place] [facility]. relative to  urology study grp.  Doesn't report ever having a stress test   . Complication of anesthesia   . PONV (postoperative nausea and vomiting)   . Cataract     no surgery as yet,starting of 3 years  . Seasonal allergies    Past Surgical History:  Past Surgical History  Procedure Laterality Date  . Cholecystectomy    . Abdominal hysterectomy    . Skin biopsy    . Shoulder surgery    . Tubal ligation    . Back surgery      x4 back surgery to include rods & screws    . Fracture surgery      1975- ORIF- L ankle   . Breast lumpectomy with needle localization and axillary sentinel lymph node bx  03/12/2012    Procedure: BREAST LUMPECTOMY WITH NEEDLE LOCALIZATION AND AXILLARY SENTINEL LYMPH NODE BX;  Surgeon: Merrie Roof, MD;  Location: [place];  Service: General;  Laterality: Left;  . Re-excision of breast lumpectomy  03/22/2012    Procedure: RE-EXCISION OF BREAST LUMPECTOMY;  Surgeon: Merrie Roof, MD;  Location: [place];  Service: General;  Laterality: Left;  re-excision left breast  lumpectomy cavity  ER+, PR+  . Rotator cuff repair Bilateral   . Breast surgery      lumpectomy x2    Subjective Symptoms/Limitations Symptoms: Pt is completing her home exercise program. How long can you sit comfortably?: 45 minutes  How long can you stand comfortably?: Able to stand for 30 minutes then her back and leg will begin to hurt. How long can you walk comfortably?: Pt has a more stable gt with a walker able to walk for 45 minutes with the walker was 30.   Pain Assessment Currently in Pain?: No/denies  Precautions/Restrictions  Precautions Precautions: Fall    Sensation/Coordination/Flexibility/Functional Tests Functional Tests Functional Tests: 5 sit to stands without UE support: 17 seconds was 17 seconds. Functional Tests: 2 minute walk test: 180 feet w/o AD w/min-guard assist; was 159 with min assist. Functional Tests: FOTO: 45 was 40/  Assessment RLE Strength RLE Overall Strength Comments: taken in seated; Hip IR: 3-/5,, Hip ER: 3/5 Right Hip Flexion: 4/5 (was 4+/5 was 4/5 on 1/7; note this is seated ) Right Hip Extension: 4/5 (was 4/5) Right Hip ABduction:  (4/5 in seated would be less sidelying) Right Hip ADduction: 3+/5 (was 3+/5) Right Knee Flexion: 5/5 Right Knee Extension: 5/5 Right  Ankle Dorsiflexion: 5/5 (was 4/5) LLE Strength LLE Overall Strength Comments: taken in seated; Hip IR: 2-/5,, Hip ER: 3/5 Left Hip Flexion:  (4+/5 was 4/5 last tested but this is in sitting supine would) Left Hip Extension: 3+/5 Left Hip ABduction: 3/5 (was 3-/5) Left Hip ADduction: 3-/5 (was 3/5) Left Knee Flexion: 5/5 (was 5/5) Left Knee Extension: 5/5 (was 4/5) Left Ankle Dorsiflexion: 4/5 (was 4/5)  Exercise/Treatments Mobility/Balance  Ambulation/Gait Ambulation/Gait: Yes Assistive device: Rollator Gait Pattern: Trendelenburg;Trunk flexed (Lt toe out) Posture/Postural Control Posture/Postural Control: Postural limitations Postural Limitations: pt forward bent  at 25 degrees with standing and walking activities Static Standing Balance Single Leg Stance - Right Leg: 11 Single Leg Stance - Left Leg: 3 Tandem Stance - Right Leg: 60 Tandem Stance - Left Leg: 60 Rhomberg - Eyes Opened: 60 Rhomberg - Eyes Closed: 60 Berg Balance Test Sit to Stand: Able to stand without using hands and stabilize independently Standing Unsupported: Able to stand safely 2 minutes Sitting with Back Unsupported but Feet Supported on Floor or Stool: Able to sit safely and securely 2 minutes Stand to Sit: Sits safely with minimal use of hands Transfers: Able to transfer safely, minor use of hands Standing Unsupported with Eyes Closed: Able to stand 10 seconds with supervision Standing Ubsupported with Feet Together: Able to place feet together independently and stand 1 minute safely From Standing, Reach Forward with Outstretched Arm: Can reach forward >12 cm safely (5") From Standing Position, Pick up Object from Floor: Able to pick up shoe safely and easily From Standing Position, Turn to Look Behind Over each Shoulder: Looks behind one side only/other side shows less weight shift Turn 360 Degrees: Able to turn 360 degrees safely but slowly Standing Unsupported, Alternately Place Feet on Step/Stool: Able to stand independently and complete 8 steps >20 seconds Standing Unsupported, One Foot in Front: Able to place foot tandem independently and hold 30 seconds Standing on One Leg: Able to lift leg independently and hold equal to or more than 3 seconds Total Score: 48 Timed Up and Go Test TUG: Normal TUG Normal TUG (seconds): 13 (w/min A, w/o AD was 13 seconds.)      Physical Therapy Assessment and Plan PT Assessment and Plan Clinical Impression Statement: Pt continues to improve with balance but has significant decreased stability will concentrate on strengthening for improved stability of gt with less emphasis on balance Pt will benefit from skilled therapeutic  intervention in order to improve on the following deficits: Abnormal gait;Decreased activity tolerance;Decreased balance;Decreased strength;Difficulty walking PT Frequency: Min 3X/week for 4 more weeks. PT Treatment/Interventions: Gait training;Stair training;Functional mobility training;Therapeutic activities;Therapeutic exercise;Balance training;DME instruction;Neuromuscular re-education;Patient/family education;Manual techniques PT Plan: emphasis strength    Goals Home Exercise Program Pt/caregiver will Perform Home Exercise Program: For improved balance;For increased strengthening;Independently PT Short Term Goals Time to Complete Short Term Goals: 3 weeks PT Short Term Goal 1: Pt will improve BLE strength by 1 muscle grade in order to stand for greater than 10 minutes with intermittent HHA without a rest break to continue with balance activities.  PT Short Term Goal 1 - Progress: Met PT Short Term Goal 2: Pt will improve her postural strength to [facility] in order to ambulate without trunk flexion.  PT Short Term Goal 2 - Progress: Progressing toward goal PT Short Term Goal 3: Pt will improve her BLE hip IR and ER strength to 4/5 in order to ambulate with decreased Trendelenberg gait.  PT Short Term Goal 3 - Progress: Progressing toward  goal PT Short Term Goal 4: Pt will improve her self confidence and proprioceptive awarness to begin ambulating with a SPC in outdoor environment to participate in community activities.  PT Short Term Goal 4 - Progress: Not met PT Long Term Goals PT Long Term Goal 1: Pt will improve her berg balance test to greater than 50/56 to decrease risk of falls. PT Long Term Goal 1 - Progress: Progressing toward goal Long Term Goal 3: Pt will improve her postural strength to [facility] in order to ambulate with forward flexion less than 10 degrees with SPC to improve respiratory function.  Long Term Goal 3 Progress: Progressing toward goal Long Term Goal 4: Pt will improve her  FOTO score to limitaion less than 50% for improved percieved functional ability.  Long Term Goal 4 Progress: Progressing toward goal PT Long Term Goal 5: Pt to be weaned from her back brace. Long Term Goal 5 Progress: Met  Problem List Patient Active Problem List   Diagnosis Date Noted  . Bilateral leg weakness 09/23/2012  . Acute renal failure 07/25/2012  . Dehydration 07/25/2012  . DM type 2, uncontrolled, with neuropathy 07/25/2012  . DM (diabetes mellitus), type 2, uncontrolled 07/25/2012  . Essential hypertension, benign 07/25/2012  . Generalized weakness 07/25/2012  . Hyperlipidemia   . Chronic kidney disease   . Skin cancer   . Hypertension   . GERD (gastroesophageal reflux disease)   . Breast cancer 02/19/2012  . CAP (community acquired pneumonia) 04/19/2011  . Nausea and vomiting 04/19/2011  . HTN (hypertension) 04/19/2011  . DM type 2 (diabetes mellitus, type 2) 04/19/2011  . Arthritis 04/19/2011    PT - End of Session Equipment Utilized During Treatment: Gait belt  GP    RUSSELL,CINDY 04/04/2013, 11:05 AM  Physician Documentation Your signature is required to indicate approval of the treatment plan as stated above.  Please sign and either send electronically or make a copy of this report for your files and return this physician signed original.   Please mark one 1.__approve of plan  2. ___approve of plan with the following conditions.   ______________________________                                                          _____________________ Physician Signature                                                                                                             Date

## 2013-04-07 ENCOUNTER — Telehealth (HOSPITAL_COMMUNITY): Payer: Self-pay

## 2013-04-07 ENCOUNTER — Ambulatory Visit (HOSPITAL_COMMUNITY): Payer: Self-pay | Admitting: Physical Therapy

## 2013-04-09 ENCOUNTER — Ambulatory Visit (HOSPITAL_COMMUNITY): Payer: Self-pay

## 2013-04-10 ENCOUNTER — Encounter (INDEPENDENT_AMBULATORY_CARE_PROVIDER_SITE_OTHER): Payer: Self-pay | Admitting: General Surgery

## 2013-04-10 ENCOUNTER — Ambulatory Visit (INDEPENDENT_AMBULATORY_CARE_PROVIDER_SITE_OTHER): Payer: Medicare Other | Admitting: General Surgery

## 2013-04-10 VITALS — BP 158/96 | HR 88 | Temp 98.2°F | Resp 15 | Ht 61.0 in | Wt 171.4 lb

## 2013-04-10 DIAGNOSIS — C50919 Malignant neoplasm of unspecified site of unspecified female breast: Secondary | ICD-10-CM

## 2013-04-10 NOTE — Progress Notes (Signed)
Subjective:     Patient ID: [name], female   DOB: [birth year], 80 y.o.   MRN: [id number]  HPI The patient is a 80 year old white female who is one year status post left breast lumpectomy and negative sentinel node biopsy for a T1 B. N0 left breast cancer. She is been well since her last visit. She has no complaints today. Her appetite is good and her bowels are working normally. She is taking anastrozole and tolerated that well. Her last mammogram was in November and showed no evidence of malignancy  Review of Systems  Constitutional: Negative.   HENT: Negative.   Eyes: Negative.   Respiratory: Negative.   Cardiovascular: Negative.   Gastrointestinal: Negative.   Endocrine: Negative.   Genitourinary: Negative.   Musculoskeletal: Negative.   Skin: Negative.   Allergic/Immunologic: Negative.   Neurological: Negative.   Hematological: Negative.   Psychiatric/Behavioral: Negative.        Objective:   Physical Exam  Constitutional: She is oriented to person, place, and time. She appears well-developed and well-nourished.  HENT:  Head: Normocephalic and atraumatic.  Eyes: Conjunctivae and EOM are normal. Pupils are equal, round, and reactive to light.  Neck: Normal range of motion. Neck supple.  Cardiovascular: Normal rate, regular rhythm and normal heart sounds.   Pulmonary/Chest: Effort normal and breath sounds normal.  There is no palpable mass in either breast. There is no palpable axillary, supraclavicular, or cervical lymphadenopathy  Abdominal: Soft. Bowel sounds are normal.  Musculoskeletal: Normal range of motion.  Lymphadenopathy:    She has no cervical adenopathy.  Neurological: She is alert and oriented to person, place, and time.  Skin: Skin is warm and dry.  Psychiatric: She has a normal mood and affect. Her behavior is normal.       Assessment:     The patient is one year status post left breast lumpectomy for breast cancer     Plan:     At this  point she will continue to do regular self exams. She will continue to take anastrozole. I'll plan to see her back in another 6 months.

## 2013-04-10 NOTE — Patient Instructions (Signed)
Continue regular self exams Continue arimidex

## 2013-04-11 ENCOUNTER — Ambulatory Visit (HOSPITAL_COMMUNITY): Payer: Self-pay | Admitting: Physical Therapy

## 2013-05-06 ENCOUNTER — Ambulatory Visit (INDEPENDENT_AMBULATORY_CARE_PROVIDER_SITE_OTHER): Payer: Medicare Other

## 2013-05-06 VITALS — BP 137/69 | HR 84 | Resp 16

## 2013-05-06 DIAGNOSIS — E1149 Type 2 diabetes mellitus with other diabetic neurological complication: Secondary | ICD-10-CM

## 2013-05-06 DIAGNOSIS — E1140 Type 2 diabetes mellitus with diabetic neuropathy, unspecified: Secondary | ICD-10-CM

## 2013-05-06 DIAGNOSIS — IMO0002 Reserved for concepts with insufficient information to code with codable children: Secondary | ICD-10-CM

## 2013-05-06 DIAGNOSIS — M79609 Pain in unspecified limb: Secondary | ICD-10-CM

## 2013-05-06 DIAGNOSIS — B351 Tinea unguium: Secondary | ICD-10-CM

## 2013-05-06 DIAGNOSIS — E1165 Type 2 diabetes mellitus with hyperglycemia: Secondary | ICD-10-CM

## 2013-05-06 DIAGNOSIS — E1142 Type 2 diabetes mellitus with diabetic polyneuropathy: Secondary | ICD-10-CM

## 2013-05-06 NOTE — Patient Instructions (Signed)
Diabetes and Foot Care Diabetes may cause you to have problems because of poor blood supply (circulation) to your feet and legs. This may cause the skin on your feet to become thinner, break easier, and heal more slowly. Your skin may become dry, and the skin may peel and crack. You may also have nerve damage in your legs and feet causing decreased feeling in them. You may not notice minor injuries to your feet that could lead to infections or more serious problems. Taking care of your feet is one of the most important things you can do for yourself.  HOME CARE INSTRUCTIONS  Wear shoes at all times, even in the house. Do not go barefoot. Bare feet are easily injured.  Check your feet daily for blisters, cuts, and redness. If you cannot see the bottom of your feet, use a mirror or ask someone for help.  Wash your feet with warm water (do not use hot water) and mild soap. Then pat your feet and the areas between your toes until they are completely dry. Do not soak your feet as this can dry your skin.  Apply a moisturizing lotion or petroleum jelly (that does not contain alcohol and is unscented) to the skin on your feet and to dry, brittle toenails. Do not apply lotion between your toes.  Trim your toenails straight across. Do not dig under them or around the cuticle. File the edges of your nails with an emery board or nail file.  Do not cut corns or calluses or try to remove them with medicine.  Wear clean socks or stockings every day. Make sure they are not too tight. Do not wear knee-high stockings since they may decrease blood flow to your legs.  Wear shoes that fit properly and have enough cushioning. To break in new shoes, wear them for just a few hours a day. This prevents you from injuring your feet. Always look in your shoes before you put them on to be sure there are no objects inside.  Do not cross your legs. This may decrease the blood flow to your feet.  If you find a minor scrape,  cut, or break in the skin on your feet, keep it and the skin around it clean and dry. These areas may be cleansed with mild soap and water. Do not cleanse the area with peroxide, alcohol, or iodine.  When you remove an adhesive bandage, be sure not to damage the skin around it.  If you have a wound, look at it several times a day to make sure it is healing.  Do not use heating pads or hot water bottles. They may burn your skin. If you have lost feeling in your feet or legs, you may not know it is happening until it is too late.  Make sure your health care provider performs a complete foot exam at least annually or more often if you have foot problems. Report any cuts, sores, or bruises to your health care provider immediately. SEEK MEDICAL CARE IF:   You have an injury that is not healing.  You have cuts or breaks in the skin.  You have an ingrown nail.  You notice redness on your legs or feet.  You feel burning or tingling in your legs or feet.  You have pain or cramps in your legs and feet.  Your legs or feet are numb.  Your feet always feel cold. SEEK IMMEDIATE MEDICAL CARE IF:   There is increasing redness,   swelling, or pain in or around a wound.  There is a red line that goes up your leg.  Pus is coming from a wound.  You develop a fever or as directed by your health care provider.  You notice a bad smell coming from an ulcer or wound. Document Released: 02/11/2000 Document Revised: 10/16/2012 Document Reviewed: 07/23/2012 Exit[employer] Patient Information 2014 [employer].  

## 2013-05-06 NOTE — Progress Notes (Signed)
   Subjective:    Patient ID: Algis Liming, female    DOB: Jul 16, 1933, 80 y.o.   MRN: [id number]  HPI Comments: "Trim my nails"     Review of Systems no new changes or findings at this visit     Objective:   Physical Exam Masker status is intact pedal pulses DP postal for PT one over 4 bilateral capillary refill time 3 seconds all digits epicritic and proprioceptive sensations are intact and symmetric bilateral lower decreased sensation in plantar forefoot and digits as well as arch on [employer] testing. Dermatologically skin color pigment normal hair growth absent nails thick criptotic incurvated crumbly discolored and friable 1 through 5 bilateral painful tender no open wounds ulcerations no secondary infection is noted mild flexible digital contractures are noted.       Assessment & Plan:  Assessment this time his diabetes history of neuropathy thick criptotic incurvated friable brittle nails discolored and painful tender 1 through 5 bilateral mycotic nails debridement presence of diabetes as well as applications of painful mycotic nails return for future basis as-needed basis for followup palliative care  Harriet Masson DPM

## 2013-06-16 ENCOUNTER — Other Ambulatory Visit: Payer: Self-pay | Admitting: Dermatology

## 2013-08-06 ENCOUNTER — Ambulatory Visit (HOSPITAL_COMMUNITY)
Admission: RE | Admit: 2013-08-06 | Discharge: 2013-08-06 | Disposition: A | Discharge status: Home / Self Care | Payer: Medicare Other | Source: Ambulatory Visit | Attending: Neurosurgery | Admitting: Neurosurgery

## 2013-08-06 DIAGNOSIS — M545 Low back pain, unspecified: Secondary | ICD-10-CM

## 2013-08-06 DIAGNOSIS — E1149 Type 2 diabetes mellitus with other diabetic neurological complication: Secondary | ICD-10-CM | POA: Diagnosis not present

## 2013-08-06 DIAGNOSIS — IMO0001 Reserved for inherently not codable concepts without codable children: Secondary | ICD-10-CM | POA: Insufficient documentation

## 2013-08-06 DIAGNOSIS — E785 Hyperlipidemia, unspecified: Secondary | ICD-10-CM | POA: Diagnosis not present

## 2013-08-06 DIAGNOSIS — R29898 Other symptoms and signs involving the musculoskeletal system: Secondary | ICD-10-CM | POA: Insufficient documentation

## 2013-08-06 DIAGNOSIS — E1142 Type 2 diabetes mellitus with diabetic polyneuropathy: Secondary | ICD-10-CM | POA: Insufficient documentation

## 2013-08-06 DIAGNOSIS — N179 Acute kidney failure, unspecified: Secondary | ICD-10-CM | POA: Diagnosis not present

## 2013-08-06 DIAGNOSIS — R262 Difficulty in walking, not elsewhere classified: Secondary | ICD-10-CM | POA: Diagnosis not present

## 2013-08-06 DIAGNOSIS — I10 Essential (primary) hypertension: Secondary | ICD-10-CM | POA: Insufficient documentation

## 2013-08-06 NOTE — Evaluation (Addendum)
Physical Therapy Evaluation  Patient Details  Name: [name] MRN: [id number] Date of Birth: 09-16-1933  Today's Date: 08/06/2013 Time: 1015-1100 PT Time Calculation (min): 45 min    Charges: 1 Eval, therEx 1050-1100          Visit#: 1 of 16  Re-eval: 09/05/13    Authorization: [employer]    Authorization Visit#:  1 of  16   Past Medical History:  Past Medical History  Diagnosis Date  . Diabetes mellitus   . Arthritis   . Hyperlipidemia   . Mental disorder   . Chronic kidney disease     renal stones, one episode of Lithotripsy  . GERD (gastroesophageal reflux disease)   . Skin cancer     face- melanoma, 2012- surg. excision    . Breast cancer, left     er/pr +  . Hypertension     followed by [place] [facility]. relative to  urology study grp.  Doesn't report ever having a stress test   . Complication of anesthesia   . PONV (postoperative nausea and vomiting)   . Cataract     no surgery as yet,starting of 3 years  . Seasonal allergies    Past Surgical History:  Past Surgical History  Procedure Laterality Date  . Cholecystectomy    . Abdominal hysterectomy    . Skin biopsy    . Shoulder surgery    . Tubal ligation    . Back surgery      x4 back surgery to include rods & screws    . Fracture surgery      1975- ORIF- L ankle   . Breast lumpectomy with needle localization and axillary sentinel lymph node bx  03/12/2012    Procedure: BREAST LUMPECTOMY WITH NEEDLE LOCALIZATION AND AXILLARY SENTINEL LYMPH NODE BX;  Surgeon: Merrie Roof, MD;  Location: [place];  Service: General;  Laterality: Left;  . Re-excision of breast lumpectomy  03/22/2012    Procedure: RE-EXCISION OF BREAST LUMPECTOMY;  Surgeon: Merrie Roof, MD;  Location: [employer];  Service: General;  Laterality: Left;  re-excision left breast lumpectomy cavity  ER+, PR+  . Rotator cuff repair Bilateral   . Breast surgery      lumpectomy x2    Subjective Symptoms/Limitations Symptoms: Low back  pain. Patien has contineud to have low back pain.  Pertinent History: Surgery May 1st 2014 fusion of lumbar spine; history of 5 back surgeries.  Patient ambualtes with a walker or cain, but primarily uses walker as she feels she needs more support. patient yuses cain primarily in home. history of breast cancer 2014, high blood pressure, controlled with medication, diabetes II,  How long can you sit comfortably?: no difficulty How long can you stand comfortably?:  30 minutes How long can you walk comfortably?:  74minutes Patient Stated Goals: to be able to stay up on feet more and stand longer with less pain.  Pain Assessment Currently in Pain?: Yes Pain Score: 8  (worst in last 24 hours) Pain Location: Back Pain Orientation: Posterior;Right;Lower Pain Radiating Towards: Rt hip Pain Onset: More than a month ago Pain Frequency: Intermittent Pain Relieving Factors: medications Effect of Pain on Daily Activities: unable to move too much, pain with performing regular house work. Vaccuming, bending over to make beds, cooking,   Exercise/Treatments Seated  3D thoracic spine excursion 10x.   Physical Therapy Assessment and Plan PT Assessment and Plan Clinical Impression Statement: Patient os a pleasant 80yo female  who arrive to therapy with primary complaintof low back pain with prolonged standing and walking that is attributed limited hip and low back mobility, abnormal gait, bilateral LE weakness and decreased trunk stability. Specifically patient displays multiple postural abnormalities including  scoliosis that is Rt side bent and left rotated, hip misalignment with Rt innominate anteriorly tilted and Lt inniminated posteriorly tilted,  forward rounded shoulders,, and forward flexed trunk during standing secondary to fear of falls. Patient also has significant glut med/max weakness resultign in difficulty performing sit to stand  transfers and inability to perfom single leg standing. Patient's  abdominal mmuscles also displayed significant  weakness resulting in significant instability when attempting single leg lifting.Patient also has significant piriformis  mobility limitations resulting in significant toeing out during gait. Patient will bnbenefit from skilled physcial therapy to address the above listed limitation s and return to prior level of function.   Pt will benefit from skilled therapeutic intervention in order to improve on the following deficits: Abnormal gait;Decreased activity tolerance;Decreased balance;Difficulty walking;Decreased strength;Decreased range of motion Rehab Potential: Fair Clinical Impairments Affecting Rehab Potential: Patient is highly motivated but has a long history of low back pain.  PT Frequency: Min 2X/week PT Duration: 8 weeks PT Treatment/Interventions: Gait training;Stair training;Functional mobility training;Therapeutic activities;Therapeutic exercise;Balance training;Modalities;Manual techniques;Patient/family education PT Plan: Patient will require a directional approach to address all limitations. Initial focus on improving: Hip, thoracic spine and shoulder mobility, and increasing trunk stability. As mobility improves strengthenign and stabilization of bilaterqal LEs, UEs and trunk will continue with a specifc focus on improving  Spinal and hip alignment.     Goals Home Exercise Program Pt/caregiver will Perform Home Exercise Program: For increased ROM;For increased strengthening;For improved balance;Independently PT Goal: Perform Home Exercise Program - Progress: Goal set today PT Short Term Goals Time to Complete Short Term Goals: 4 weeks PT Short Term Goal 1: Patient will be able to tolerate standing >1 hours without pain >4/10 so patint may stand to cook dinner. PT Short Term Goal 2: Patient will be able to walking standing >1 hours with the least restrictive device without pain >4/10 sop patient may perform grocery shopping. PT Short Term  Goal 3: Patient's Hip Internal rotation ROM will improve to greater than 30 degrees bilaterally to improve shock absorbtion during heel strike phase gait. PT Short Term Goal 4: Patient will demosntrate improve core strength of 4/5 with MMT to improve ability to maintain correct hip alignment for > 1 week to decrease strain on low back. PT Short Term Goal 5: Patient will improve hip evtension to 5 degrees bilaterally to increse stride length during gait.  Additional PT Short Term Goals?: Yes PT Short Term Goal 6: Patient will improve spine extension mobility to less than 50% limited PT Long Term Goals Time to Complete Long Term Goals: 8 weeks PT Long Term Goal 1: Patient will be able to tolerate standing >2 hours without pain >2/10 PT Long Term Goal 2: Patient will be able to walking standing >2 hours without pain >2/10 Long Term Goal 3: Patient will increase hamstring, hip abduction and hip extension strength to 4/5 bilaterally so she can sit to stand without usign her hands Long Term Goal 4: Patient will improve whole spine extension mobility to less than 20% limited PT Long Term Goal 5: Patient will increase hamstring, hip abduction and hip extension strength to 5/5 bilaterally so she can ambulate up and down stairs with one hand rail assist.  Additional PT Long Term Goals?: Yes  PT Long Term Goal 6: Patient will demosntrate improve LE and  core strength of 5/5 to be bale to perform singleleg standing withotu UE support  Problem List Patient Active Problem List   Diagnosis Date Noted  . Lumbago 08/06/2013  . Difficulty in walking(719.7) 08/06/2013  . Bilateral leg weakness 09/23/2012  . Acute renal failure 07/25/2012  . Dehydration 07/25/2012  . DM type 2, uncontrolled, with neuropathy 07/25/2012  . DM (diabetes mellitus), type 2, uncontrolled 07/25/2012  . Essential hypertension, benign 07/25/2012  . Generalized weakness 07/25/2012  . Hyperlipidemia   . Chronic kidney disease   . Skin  cancer   . Hypertension   . GERD (gastroesophageal reflux disease)   . Breast cancer 02/19/2012  . CAP (community acquired pneumonia) 04/19/2011  . Nausea and vomiting 04/19/2011  . HTN (hypertension) 04/19/2011  . DM type 2 (diabetes mellitus, type 2) 04/19/2011  . Arthritis 04/19/2011    PT - End of Session Activity Tolerance: Patient tolerated treatment well General Behavior During Therapy: WFL for tasks assessed/performed PT Plan of Care PT Home Exercise Plan: 3D thoracic spine excursion 10x  Consulted and Agree with Plan of Care: Patient  GP   Functional Assessment Tool Used: clinical judgement Functional Limitation: Mobility: Walking and moving around Mobility: Walking and Moving Around Current Status [id number]): At least 60 percent but less than 80 percent impaired, limited or restricted Mobility: Walking and Moving Around Goal Status [phone]): At least 40 percent but less than 60 percent impaired, limited or restricted  Leia Alf 08/06/2013, 2:38 PM  Physician Documentation Your signature is required to indicate approval of the treatment plan as stated above.  Please sign and either send electronically or make a copy of this report for your files and return this physician signed original.   Please mark one 1.__approve of plan  2. ___approve of plan with the following conditions.   ______________________________                                                          _____________________ Physician Signature                                                                                                             Date

## 2013-08-13 ENCOUNTER — Ambulatory Visit (HOSPITAL_COMMUNITY)
Admission: RE | Admit: 2013-08-13 | Discharge: 2013-08-13 | Disposition: A | Discharge status: Home / Self Care | Payer: Medicare Other | Source: Ambulatory Visit | Attending: Pulmonary Disease | Admitting: Pulmonary Disease

## 2013-08-13 DIAGNOSIS — IMO0001 Reserved for inherently not codable concepts without codable children: Secondary | ICD-10-CM | POA: Diagnosis not present

## 2013-08-13 NOTE — Progress Notes (Signed)
Physical Therapy Treatment Patient Details  Name: [name] MRN: [id number] Date of Birth: 1933/03/04  Today's Date: 08/13/2013 Time: 7564-3329 PT Time Calculation (min): 45 min   Charges: TherEx [id number] Visit#: 2 of 16  Re-eval: 09/05/13 Assessment Diagnosis: Low back pain secondary to stiffness and weakness Next MD Visit: Vertell Limber 08/27/13 Prior Therapy: Yes - helpful.   Authorization: [employer]  Authorization Visit#:  2 of   16  Subjective: Symptoms/Limitations Symptoms: Patient notes contineud low back pain Pain Assessment Currently in Pain?: Yes Pain Score: 7  (earlier this morning before pain pill, better now, almost gone. )  Exercise/Treatments Stretches Active Hamstring Stretch: Limitations;2 reps;20 seconds Active Hamstring Stretch Limitations: 3 way to 8" Hip Flexor Stretch: 2 reps;20 seconds;Limitations Hip Flexor Stretch Limitations: 3 way to 8" Piriformis Stretch: 20 seconds;2 reps;Limitations Piriformis Stretch Limitations: 3way 3D thoracic spine excursion with alternating over over head reaches 10x each way Standing Other Standing Lumbar Exercises: 3 way calf stretch on wedge, 20seconds 2x each , 2way groin stretch to 8" 20seconds each, (3 way Pec stretch 20seconds 2x to be added next session) Supine (to all be added next session) Ab Set: 10 reps Bridge: Limitations;10 reps Bridge Limitations: 3 sets  feet neutral and split stance    Physical Therapy Assessment and Plan PT Assessment and Plan Clinical Impression Statement: Patient's primary complaint continues to be low back pain with prolonged standing and walking that is attributed limited hip and low back mobility, abnormal gait, bilateral LE weakness and decreased trunk stability. Patient displays multiple postural abnormalities including scoliosis that is Rt side bent and left rotated, hip misalignment with Rt innominate anteriorly tilted and Lt innominate posteriorly tilted, forward rounded shoulders,, and  forward flexed trunk during standing secondary to fear of falls. Patient also has significant glut med/max weakness resulting in difficulty performing sit to stand transfers and inability to perform single leg standing. Patient's abdominal muscles also displayed significant weakness resulting in significant instability when attempting single leg lifting. This session focused on educating in performance of stretches for HEP following which patient noted improved mobility and decrease strain in her back. PT Treatment/Interventions: Gait training;Stair training;Functional mobility training;Therapeutic activities;Therapeutic exercise;Balance training;Modalities;Manual techniques;Patient/family education PT Plan: Patient will require a multi-directional approach to address all limitations. Initial focus on improving: Hip, thoracic spine and shoulder mobility, and increasing trunk stability. As mobility improves strengthenign and stabilization of bilaterqal LEs, UEs and trunk will continue with a specifc focus on improving  Spinal and hip alignment. Next session introduce Pec stretch and review home stretches    Goals Home Exercise Program Pt/caregiver will Perform Home Exercise Program: For increased ROM;For increased strengthening;For improved balance;Independently PT Goal: Perform Home Exercise Program - Progress: Partly met PT Short Term Goals PT Short Term Goal 1: Patient will be able to tolerate standing >1 hours without pain >4/10 so patint may stand to cook dinner. PT Short Term Goal 1 - Progress: Progressing toward goal PT Short Term Goal 2: Patient will be able to walking standing >1 hours with the least restrictive device without pain >4/10 sop patient may perform grocery shopping. PT Short Term Goal 2 - Progress: Progressing toward goal PT Short Term Goal 3: Patient's Hip Internal rotation ROM will improve to greater than 30 degrees bilaterally to improve shock absorbtion during heel strike phase  gait. PT Short Term Goal 3 - Progress: Progressing toward goal PT Short Term Goal 4: Patient will demosntrate improve core strength of 4/5 with MMT to improve ability to maintain  correct hip alignment for > 1 week to decrease strain on low back. PT Short Term Goal 4 - Progress: Progressing toward goal PT Short Term Goal 5: Patient will improve hip extension to 5 degrees bilaterally to increse stride length during gait.  PT Short Term Goal 5 - Progress: Progressing toward goal PT Short Term Goal 6: Patient will improve spine extension mobility to less than 50% limited PT Long Term Goals PT Long Term Goal 1: Patient will be able to tolerate standing >2 hours without pain >2/10 PT Long Term Goal 1 - Progress: Progressing toward goal PT Long Term Goal 2: Patient will be able to walking standing >2 hours without pain >2/10 PT Long Term Goal 2 - Progress: Progressing toward goal Long Term Goal 3: Patient will increase hamstring, hip abduction and hip extension strength to 4/5 bilaterally so she can sit to stand without usign her hands Long Term Goal 3 Progress: Progressing toward goal Long Term Goal 4: Patient will improve whole spine extension mobility to less than 20% limited Long Term Goal 4 Progress: Progressing toward goal PT Long Term Goal 5: Patient will increase hamstring, hip abduction and hip extension strength to 5/5 bilaterally so she can ambulate up and down stairs with one hand rail assist.  Long Term Goal 5 Progress: Progressing toward goal PT Long Term Goal 6: Patient will demosntrate improve LE and  core strength of 5/5 to be bale to perform singleleg standing withotu UE support  Problem List Patient Active Problem List   Diagnosis Date Noted  . Lumbago 08/06/2013  . Difficulty in walking(719.7) 08/06/2013  . Bilateral leg weakness 09/23/2012  . Acute renal failure 07/25/2012  . Dehydration 07/25/2012  . DM type 2, uncontrolled, with neuropathy 07/25/2012  . DM (diabetes  mellitus), type 2, uncontrolled 07/25/2012  . Essential hypertension, benign 07/25/2012  . Generalized weakness 07/25/2012  . Hyperlipidemia   . Chronic kidney disease   . Skin cancer   . Hypertension   . GERD (gastroesophageal reflux disease)   . Breast cancer 02/19/2012  . CAP (community acquired pneumonia) 04/19/2011  . Nausea and vomiting 04/19/2011  . HTN (hypertension) 04/19/2011  . DM type 2 (diabetes mellitus, type 2) 04/19/2011  . Arthritis 04/19/2011    PT - End of Session Activity Tolerance: Patient tolerated treatment well General Behavior During Therapy: WFL for tasks assessed/performed PT Plan of Care PT Home Exercise Plan: 3D thoracic spine excursion 10x hip flexor, hamstring, calf, groin, and piriformis stretches  GP    Lynzie Cliburn R 08/13/2013, 9:26 AM

## 2013-08-14 ENCOUNTER — Ambulatory Visit (HOSPITAL_COMMUNITY)
Admission: RE | Admit: 2013-08-14 | Discharge: 2013-08-14 | Disposition: A | Discharge status: Home / Self Care | Payer: Medicare Other | Source: Ambulatory Visit | Attending: Neurosurgery | Admitting: Neurosurgery

## 2013-08-14 DIAGNOSIS — IMO0001 Reserved for inherently not codable concepts without codable children: Secondary | ICD-10-CM | POA: Diagnosis not present

## 2013-08-14 NOTE — Progress Notes (Signed)
Physical Therapy Treatment Patient Details  Name: [name] MRN: [id number] Date of Birth: [birth year]  Today's Date: 08/14/2013 Time: 1017-1102 PT Time Calculation (min): 45 min    Charges: TherEx [id number] Visit#: 3 of 16  Re-eval: 09/05/13 Assessment Diagnosis: Low back pain secondary to stiffness and weakness Next MD Visit: Vertell Limber 08/27/13 Prior Therapy: Yes - helpful.   Authorization: [employer]  Authorization Time Period:    Authorization Visit#: 3 of 16   Subjective: Symptoms/Limitations Symptoms: Patient notes continued low back pain Pain Assessment Currently in Pain?: Yes Pain Score: 5  Pain Location: Back Pain Orientation: Posterior;Right;Lower Pain Radiating Towards: Rt hip Pain Onset: More than a month ago Pain Frequency: Intermittent  Exercise/Treatments Stretches Active Hamstring Stretch: Limitations;2 reps;20 seconds Active Hamstring Stretch Limitations: 3 way to 8" box 10x 3seconds Hip Flexor Stretch Limitations: 10c 3" 3 way to 8" box Piriformis Stretch: 20 seconds;2 reps;Limitations Piriformis Stretch Limitations: 3way Standing Functional Squats: 10 reps;Limitations Functional Squats Limitations: feet neutral and split stand with UE assistance 5x each Other Standing Lumbar Exercises: 3way Pec stretch 5x 3seconds Seated Other Seated Lumbar Exercises: 3D thoracic spine excursion 10x Supine Ab Set: 10 reps Bent Knee Raise: 10 reps;Limitations Bent Knee Raise Limitations: biunilateral, bilateral and both raised with unilateral lowering 10x each Bridge: Limitations;10 reps Bridge Limitations: 3 sets  feet neutral and split stance   Hips showed even alignment no manual therapy indicated this session  Physical Therapy Assessment and Plan PT Assessment and Plan Clinical Impression Statement: Patient's primary complaint continues to be low back pain with prolonged standing and walking that is making good improvements following 2 sessions. Pain attributed  limited hip and low back mobility, abnormal gait, bilateral LE weakness and decreased trunk stability. Patient displays multiple postural abnormalities including scoliosis that is Rt side bent and left rotated, hip misalignment with Rt innominate anteriorly tilted and Lt innominate posteriorly tilted, forward rounded shoulders, and forward flexed trunk during standing secondary to fear of falls. Patient also has significant glut med/max weakness resulting in difficulty performing sit to stand transfers and inability to perform single leg standing. Patient's abdominal muscles also displayed significant weakness resulting in significant instability when attempting single leg lifting. This session focused on progressing stretches to be performed in multiple directions following which patient noted improved mobility and decrease strain in her back. Introduced standing glut exercises and progressed abdominal strengthening exercises for which patient noted no change in symptoms.  PT Plan: Patient require sa multi-directional approach to address all limitations. Initial focus on improving: Hip, thoracic spine and shoulder mobility, and increasing trunk stability. introduce and progress strengthening and trunk stabilization with a specifc focus on improving spinal and hip alignment. Next session  Introduce overhead dumbbell matrix for back strengthening and forward lunges with knee high reach    Goals PT Short Term Goals PT Short Term Goal 1: Patient will be able to tolerate standing >1 hours without pain >4/10 so patint may stand to cook dinner. PT Short Term Goal 1 - Progress: Progressing toward goal PT Short Term Goal 2: Patient will be able to walking standing >1 hours with the least restrictive device without pain >4/10 sop patient may perform grocery shopping. PT Short Term Goal 2 - Progress: Progressing toward goal PT Short Term Goal 3: Patient's Hip Internal rotation ROM will improve to greater than 30  degrees bilaterally to improve shock absorbtion during heel strike phase gait. PT Short Term Goal 3 - Progress: Progressing toward goal PT Short Term Goal  4: Patient will demosntrate improve core strength of 4/5 with MMT to improve ability to maintain correct hip alignment for > 1 week to decrease strain on low back. PT Short Term Goal 4 - Progress: Progressing toward goal PT Long Term Goals PT Long Term Goal 1: Patient will be able to tolerate standing >2 hours without pain >2/10 PT Long Term Goal 1 - Progress: Progressing toward goal PT Long Term Goal 2: Patient will be able to walking standing >2 hours without pain >2/10 PT Long Term Goal 2 - Progress: Progressing toward goal Long Term Goal 3: Patient will increase hamstring, hip abduction and hip extension strength to 4/5 bilaterally so she can sit to stand without usign her hands Long Term Goal 3 Progress: Progressing toward goal Long Term Goal 4: Patient will improve whole spine extension mobility to less than 20% limited Long Term Goal 4 Progress: Progressing toward goal PT Long Term Goal 5: Patient will increase hamstring, hip abduction and hip extension strength to 5/5 bilaterally so she can ambulate up and down stairs with one hand rail assist.  Long Term Goal 5 Progress: Progressing toward goal  Problem List Patient Active Problem List   Diagnosis Date Noted  . Lumbago 08/06/2013  . Difficulty in walking(719.7) 08/06/2013  . Bilateral leg weakness 09/23/2012  . Acute renal failure 07/25/2012  . Dehydration 07/25/2012  . DM type 2, uncontrolled, with neuropathy 07/25/2012  . DM (diabetes mellitus), type 2, uncontrolled 07/25/2012  . Essential hypertension, benign 07/25/2012  . Generalized weakness 07/25/2012  . Hyperlipidemia   . Chronic kidney disease   . Skin cancer   . Hypertension   . GERD (gastroesophageal reflux disease)   . Breast cancer 02/19/2012  . CAP (community acquired pneumonia) 04/19/2011  . Nausea and  vomiting 04/19/2011  . HTN (hypertension) 04/19/2011  . DM type 2 (diabetes mellitus, type 2) 04/19/2011  . Arthritis 04/19/2011    PT - End of Session Activity Tolerance: Patient tolerated treatment well General Behavior During Therapy: WFL for tasks assessed/performed PT Plan of Care PT Home Exercise Plan: 3D thoracic spine excursion 10x hip flexor, hamstring, calf, groin, and piriformis stretches Consulted and Agree with Plan of Care: Patient  GP    Leia Alf 08/14/2013, 11:04 AM

## 2013-08-19 ENCOUNTER — Ambulatory Visit (INDEPENDENT_AMBULATORY_CARE_PROVIDER_SITE_OTHER): Payer: Medicare Other

## 2013-08-19 DIAGNOSIS — E1140 Type 2 diabetes mellitus with diabetic neuropathy, unspecified: Secondary | ICD-10-CM

## 2013-08-19 DIAGNOSIS — M79609 Pain in unspecified limb: Secondary | ICD-10-CM

## 2013-08-19 DIAGNOSIS — B351 Tinea unguium: Secondary | ICD-10-CM

## 2013-08-19 DIAGNOSIS — E1149 Type 2 diabetes mellitus with other diabetic neurological complication: Secondary | ICD-10-CM

## 2013-08-19 NOTE — Progress Notes (Signed)
   Subjective:    Patient ID: Algis Liming, female    DOB: May 06, 1933, 80 y.o.   MRN: [id number]  HPI  Routine debride, no pain or other issues at this time  Review of Systems no new findings or systemic changes     Objective:   Physical Exam Loosefitting objective findings vascular status is intact DP +2/4 bilateral PT plus one over 4 bilateral capillary refill time 3 seconds all digits. Epicritic sensations diminished on Semmes Weinstein testing to forefoot digits and plantar arch. Slight pinch callus in the hallux bilateral although minimal no need for debridement at this time. No open wounds or ulcerations nails thick brittle crumbly discolored and friable 1 through 5 bilateral tender with enclosed shoes and ambulation. No open wounds ulcerations no secondary infections       Assessment & Plan:  Assessment diabetes with history o nails painful tender symptomatically mycotic nails 1 through 5 bilateral debrided this time the presence of pain as well as diabetes and complications return for future palliative care every 3 months  Richard Sikora [facility] neuropathy thick dystrophic criptotic

## 2013-08-20 ENCOUNTER — Ambulatory Visit (HOSPITAL_COMMUNITY)
Admission: RE | Admit: 2013-08-20 | Discharge: 2013-08-20 | Disposition: A | Discharge status: Home / Self Care | Payer: Medicare Other | Source: Ambulatory Visit | Attending: Neurosurgery | Admitting: Neurosurgery

## 2013-08-20 DIAGNOSIS — IMO0001 Reserved for inherently not codable concepts without codable children: Secondary | ICD-10-CM | POA: Diagnosis not present

## 2013-08-20 NOTE — Progress Notes (Signed)
Physical Therapy Treatment Patient Details  Name: [name] MRN: [id number] Date of Birth: 1933-04-23  Today's Date: 08/20/2013 Time: 0932-1014 PT Time Calculation (min): 42 min  Visit#: 4 of 16  Re-eval: 09/05/13 Authorization: [employer]  Authorization Visit#: 4 of 16  Charges:  therex 40'  Subjective: Symptoms/Limitations Symptoms: Pt reports no pain today, only soreness. Pain Assessment Currently in Pain?: No/denies   Exercise/Treatments Stretches Active Hamstring Stretch: Limitations Active Hamstring Stretch Limitations: 3 way to 8" box 10x 3seconds Hip Flexor Stretch: Limitations Hip Flexor Stretch Limitations: 3 way to 8" box 10X3" Piriformis Stretch: 20 seconds;2 reps;Limitations Piriformis Stretch Limitations: 3way Standing Functional Squats: 10 reps;Limitations Functional Squats Limitations: feet neutral and split stand with UE assistance 5x each Forward Lunge: 10 reps;Limitations Forward Lunge Limitations: onto 6" step with UE assist Seated Other Seated Lumbar Exercises: 3D thoracic spine excursion 10x Supine Ab Set: 10 reps Bridge: Limitations;10 reps Bridge Limitations: 3 sets 10 reps  feet neutral and split stance    Physical Therapy Assessment and Plan PT Assessment:  Pt continues to require therapist facilitation to perform therex in correct form.  Pt with difficulty bending knee with lunging forward and unable to complete without rise of 6" and tactile cues.  Pt able to complete exercises without complaints of pain today.  PT Plan: Continued focus on improving Hip, thoracic spine, shoulder mobility, and increasing trunk stability. Next session introduce overhead dumbbell matrix for back strengthening     Problem List Patient Active Problem List   Diagnosis Date Noted  . Lumbago 08/06/2013  . Difficulty in walking(719.7) 08/06/2013  . Bilateral leg weakness 09/23/2012  . Acute renal failure 07/25/2012  . Dehydration 07/25/2012  . DM type 2,  uncontrolled, with neuropathy 07/25/2012  . DM (diabetes mellitus), type 2, uncontrolled 07/25/2012  . Essential hypertension, benign 07/25/2012  . Generalized weakness 07/25/2012  . Hyperlipidemia   . Chronic kidney disease   . Skin cancer   . Hypertension   . GERD (gastroesophageal reflux disease)   . Breast cancer 02/19/2012  . CAP (community acquired pneumonia) 04/19/2011  . Nausea and vomiting 04/19/2011  . HTN (hypertension) 04/19/2011  . DM type 2 (diabetes mellitus, type 2) 04/19/2011  . Arthritis 04/19/2011    PT - End of Session Activity Tolerance: Patient tolerated treatment well General Behavior During Therapy: WFL for tasks assessed/performed PT Plan of Care PT Home Exercise Plan: 3D thoracic spine excursion 10x hip flexor, hamstring, calf, groin, and piriformis stretches Consulted and Agree with Plan of Care: Patient   Teena Irani, PTA/CLT 08/20/2013, 10:25 AM

## 2013-08-21 ENCOUNTER — Ambulatory Visit (HOSPITAL_COMMUNITY)
Admission: RE | Admit: 2013-08-21 | Discharge: 2013-08-21 | Disposition: A | Discharge status: Home / Self Care | Payer: Medicare Other | Source: Ambulatory Visit | Attending: Physical Therapy | Admitting: Physical Therapy

## 2013-08-21 DIAGNOSIS — IMO0001 Reserved for inherently not codable concepts without codable children: Secondary | ICD-10-CM | POA: Diagnosis not present

## 2013-08-21 NOTE — Progress Notes (Signed)
Physical Therapy Treatment Patient Details  Name: [name] MRN: [id number] Date of Birth: Apr 13, 1933  Today's Date: 08/21/2013 Time: [id number] PT Time Calculation (min): 50 min Visit#: 5 of 16  Re-eval: 09/05/13 Authorization: [employer]  Authorization Visit#: 5 of 16  Charges:  therex 45  Subjective: Symptoms/Limitations Symptoms: Pt states she is doing well today.  STates she was hurting this morning and had to take pain meds.  States she sometimes gets cramps in her hamstrings at night.  Pain Assessment Currently in Pain?: No/denies   Exercise/Treatments Stretches Active Hamstring Stretch: Limitations Active Hamstring Stretch Limitations: 3 way to 8" box 10x 3seconds Hip Flexor Stretch: Limitations Hip Flexor Stretch Limitations: 3 way to 8" box 10X3" Piriformis Stretch: 20 seconds;2 reps;Limitations Piriformis Stretch Limitations: seated 3way Standing Functional Squats: 10 reps;Limitations Functional Squats Limitations: feet neutral and split stand with UE assistance  Forward Lunge: 10 reps;Limitations Forward Lunge Limitations: onto 6" step with UE assist Side Lunge: 10 reps;Limitations Side Lunge Limitations: onto 6" step Seated Other Seated Lumbar Exercises: 3D thoracic spine excursion 10x Supine Bridge: Limitations;15 reps Bridge Limitations: 3 sets 10 reps  feet neutral and split stance Straight Leg Raise: 10 reps Sidelying Clam: 10 reps;3 seconds;4 seconds Hip Abduction: 10 reps Prone  Straight Leg Raise: 10 reps;Other (comment) Other Prone Lumbar Exercises: hamstring curls 10 reps bilaterally     Physical Therapy Assessment and Plan PT Assessment and Plan Clinical Impression Statement: Patient overall progressing with increased strength and stabiltiy.  Added mat strengthening exercises to address hip weakness. Pt required therapist facilitation to keep LE's positioned correctly for isolation of musculature/weakness.   PT Plan: Progress strengthening and  trunk stabilization with a specifc focus on improving spinal and hip alignment. Next session  introduce overhead dumbbell matrix for back strengthening.  Give HEP for mat exercises.     Problem List Patient Active Problem List   Diagnosis Date Noted  . Lumbago 08/06/2013  . Difficulty in walking(719.7) 08/06/2013  . Bilateral leg weakness 09/23/2012  . Acute renal failure 07/25/2012  . Dehydration 07/25/2012  . DM type 2, uncontrolled, with neuropathy 07/25/2012  . DM (diabetes mellitus), type 2, uncontrolled 07/25/2012  . Essential hypertension, benign 07/25/2012  . Generalized weakness 07/25/2012  . Hyperlipidemia   . Chronic kidney disease   . Skin cancer   . Hypertension   . GERD (gastroesophageal reflux disease)   . Breast cancer 02/19/2012  . CAP (community acquired pneumonia) 04/19/2011  . Nausea and vomiting 04/19/2011  . HTN (hypertension) 04/19/2011  . DM type 2 (diabetes mellitus, type 2) 04/19/2011  . Arthritis 04/19/2011    PT - End of Session Activity Tolerance: Patient tolerated treatment well General Behavior During Therapy: [facility] for tasks assessed/performed PT Plan of Care Consulted and Agree with Plan of Care: Patient  GP    Teena Irani, PTA/CLT 08/21/2013, 6:06 PM

## 2013-08-27 ENCOUNTER — Ambulatory Visit (HOSPITAL_COMMUNITY)
Admission: RE | Admit: 2013-08-27 | Discharge: 2013-08-27 | Disposition: A | Discharge status: Home / Self Care | Payer: Medicare Other | Source: Ambulatory Visit | Attending: Neurosurgery | Admitting: Neurosurgery

## 2013-08-27 DIAGNOSIS — E785 Hyperlipidemia, unspecified: Secondary | ICD-10-CM | POA: Insufficient documentation

## 2013-08-27 DIAGNOSIS — M545 Low back pain, unspecified: Secondary | ICD-10-CM | POA: Insufficient documentation

## 2013-08-27 DIAGNOSIS — E1149 Type 2 diabetes mellitus with other diabetic neurological complication: Secondary | ICD-10-CM | POA: Diagnosis not present

## 2013-08-27 DIAGNOSIS — R262 Difficulty in walking, not elsewhere classified: Secondary | ICD-10-CM | POA: Diagnosis not present

## 2013-08-27 DIAGNOSIS — N179 Acute kidney failure, unspecified: Secondary | ICD-10-CM | POA: Diagnosis not present

## 2013-08-27 DIAGNOSIS — I10 Essential (primary) hypertension: Secondary | ICD-10-CM | POA: Diagnosis not present

## 2013-08-27 DIAGNOSIS — R29898 Other symptoms and signs involving the musculoskeletal system: Secondary | ICD-10-CM | POA: Diagnosis not present

## 2013-08-27 DIAGNOSIS — E1142 Type 2 diabetes mellitus with diabetic polyneuropathy: Secondary | ICD-10-CM | POA: Diagnosis not present

## 2013-08-27 DIAGNOSIS — IMO0001 Reserved for inherently not codable concepts without codable children: Secondary | ICD-10-CM | POA: Insufficient documentation

## 2013-08-27 NOTE — Progress Notes (Signed)
Physical Therapy Treatment Patient Details  Name: [name] MRN: [id number] Date of Birth: 06-01-1933  Today's Date: 08/27/2013 Time: 1017-1058 PT Time Calculation (min): 41 min  Visit#: 6 of 16  Re-eval: 09/05/13 Authorization: [employer]  Authorization Visit#: 6 of 16  Charges:  therex 40  Subjective: Symptoms/Limitations Symptoms: Pt states she is doing well without pain today.  had cramps this morining but currently without pain. Pain Assessment Currently in Pain?: No/denies   Exercise/Treatments Stretches Active Hamstring Stretch: Limitations Active Hamstring Stretch Limitations: 3 way to 8" box 10x 3seconds Hip Flexor Stretch: Limitations Hip Flexor Stretch Limitations: 3 way to 8" box 10X3" Piriformis Stretch: 20 seconds;2 reps;Limitations Piriformis Stretch Limitations: seated 3way Aerobic Stationary Bike: nustep level 3 hills 2 8 minutes LE only seat 6 Standing Functional Squats: 10 reps;Limitations Functional Squats Limitations: feet neutral and split stand with UE assistance  Forward Lunge: 10 reps;Limitations Forward Lunge Limitations: onto 6" step with UE assist Side Lunge: 10 reps;Limitations Side Lunge Limitations: onto 6" step Other Standing Lumbar Exercises: 3 way dumbell matrix in neutral standing, 2" weights alternating fwd flex, abd, and diagonal rotation 3X each     Physical Therapy Assessment and Plan PT Assessment and Plan Clinical Impression Statement: Patient given written instructions for mat exercises for HEP.   Added dumbbell matrix for lumbar strengthening.  Pt able to complete all exercises with therapist facilitaton for form and proper alignment.  All exercises completed today without pain.  PT Plan: Continue to progress toward goals.       Problem List Patient Active Problem List   Diagnosis Date Noted  . Lumbago 08/06/2013  . Difficulty in walking(719.7) 08/06/2013  . Bilateral leg weakness 09/23/2012  . Acute renal failure  07/25/2012  . Dehydration 07/25/2012  . DM type 2, uncontrolled, with neuropathy 07/25/2012  . DM (diabetes mellitus), type 2, uncontrolled 07/25/2012  . Essential hypertension, benign 07/25/2012  . Generalized weakness 07/25/2012  . Hyperlipidemia   . Chronic kidney disease   . Skin cancer   . Hypertension   . GERD (gastroesophageal reflux disease)   . Breast cancer 02/19/2012  . CAP (community acquired pneumonia) 04/19/2011  . Nausea and vomiting 04/19/2011  . HTN (hypertension) 04/19/2011  . DM type 2 (diabetes mellitus, type 2) 04/19/2011  . Arthritis 04/19/2011    PT - End of Session Activity Tolerance: Patient tolerated treatment well General Behavior During Therapy: [facility] for tasks assessed/performed PT Plan of Care Consulted and Agree with Plan of Care: Patient  GP    Teena Irani, PTA/CLT 08/27/2013, 12:05 PM

## 2013-08-28 ENCOUNTER — Ambulatory Visit (HOSPITAL_COMMUNITY)
Admission: RE | Admit: 2013-08-28 | Discharge: 2013-08-28 | Disposition: A | Discharge status: Home / Self Care | Payer: Medicare Other | Source: Ambulatory Visit | Attending: Pulmonary Disease | Admitting: Pulmonary Disease

## 2013-08-28 DIAGNOSIS — IMO0001 Reserved for inherently not codable concepts without codable children: Secondary | ICD-10-CM | POA: Diagnosis not present

## 2013-08-28 NOTE — Progress Notes (Signed)
Physical Therapy Treatment Patient Details  Name: [name] MRN: [id number] Date of Birth: 1933-12-01  Today's Date: 08/28/2013 Time: 1018-1108 PT Time Calculation (min): 50 min Charge: TE [id number]  Visit#: 7 of 16  Re-eval: 09/05/13 Assessment Diagnosis: Low back pain secondary to stiffness and weakness Next MD Visit: Vertell Limber couple weeks Prior Therapy: Yes - helpful.   Authorization: [employer]  Authorization Time Period:    Authorization Visit#: 7 of 16   Subjective: Symptoms/Limitations Symptoms: Pain free, pt feels she is improving balance and strength.   Pain Assessment Currently in Pain?: No/denies  Objective:   Exercise/Treatments Stretches Active Hamstring Stretch: Limitations Active Hamstring Stretch Limitations: 3 way to 8" box 10x 3seconds Hip Flexor Stretch: Limitations Hip Flexor Stretch Limitations: 3 way to 8" box 10X3" Piriformis Stretch: 20 seconds;2 reps;Limitations Piriformis Stretch Limitations: seated 3way Aerobic Stationary Bike: nustep level 3 hills, resistance 4 x 10 minutes LE only seat 6 Standing Functional Squats: 10 reps;Limitations Functional Squats Limitations: feet neutral and split stand without UE assistance  Forward Lunge: 10 reps;Limitations Forward Lunge Limitations: onto 6" step with UE assist Side Lunge: 10 reps;Limitations Side Lunge Limitations: onto 6" step Other Standing Lumbar Exercises: 3D hip excursion Other Standing Lumbar Exercises: 3 way dumbell matrix in neutral standing, 2#weights alternating fwd flex, 0# Bil UEabd, and 2# diagonal rotation 3X each Seated Other Seated Lumbar Exercises: 3D thoracic spine excursion 10x    Physical Therapy Assessment and Plan PT Assessment and Plan Clinical Impression Statement: Pt progressing well towards PT POC goals.  Continued dumbbell matrix for lumbar strengthening, pt able to demonstrate all exercises with therapist facilitation and cueing for posture, form and proper alignement  with exercises.  No reports of pain through session.  Pt able to demonstrate proper lumbar extension to neutral, did require cueing to improve with standing and gait.   PT Plan: Continue to progress toward goals.  Progress to gait training on treadmill to imporve posture with gait and  increase activity tolerance.    Goals PT Short Term Goals PT Short Term Goal 1: Patient will be able to tolerate standing >1 hours without pain >4/10 so patint may stand to cook dinner. PT Short Term Goal 1 - Progress: Progressing toward goal PT Short Term Goal 2: Patient will be able to walking standing >1 hours with the least restrictive device without pain >4/10 sop patient may perform grocery shopping. PT Short Term Goal 2 - Progress: Progressing toward goal PT Short Term Goal 4: Patient will demosntrate improve core strength of 4/5 with MMT to improve ability to maintain correct hip alignment for > 1 week to decrease strain on low back. PT Short Term Goal 4 - Progress: Progressing toward goal PT Short Term Goal 5: Patient will improve hip extension to 5 degrees bilaterally to increse stride length during gait.  PT Short Term Goal 5 - Progress: Progressing toward goal PT Short Term Goal 6: Patient will improve spine extension mobility to less than 50% limited  Problem List Patient Active Problem List   Diagnosis Date Noted  . Lumbago 08/06/2013  . Difficulty in walking(719.7) 08/06/2013  . Bilateral leg weakness 09/23/2012  . Acute renal failure 07/25/2012  . Dehydration 07/25/2012  . DM type 2, uncontrolled, with neuropathy 07/25/2012  . DM (diabetes mellitus), type 2, uncontrolled 07/25/2012  . Essential hypertension, benign 07/25/2012  . Generalized weakness 07/25/2012  . Hyperlipidemia   . Chronic kidney disease   . Skin cancer   . Hypertension   .  GERD (gastroesophageal reflux disease)   . Breast cancer 02/19/2012  . CAP (community acquired pneumonia) 04/19/2011  . Nausea and vomiting  04/19/2011  . HTN (hypertension) 04/19/2011  . DM type 2 (diabetes mellitus, type 2) 04/19/2011  . Arthritis 04/19/2011    PT - End of Session Activity Tolerance: Patient tolerated treatment well General Behavior During Therapy: [facility] for tasks assessed/performed  GP    Aldona Lento 08/28/2013, 11:24 AM

## 2013-09-01 ENCOUNTER — Telehealth (HOSPITAL_COMMUNITY): Payer: Self-pay

## 2013-09-01 ENCOUNTER — Ambulatory Visit (HOSPITAL_COMMUNITY): Payer: Self-pay

## 2013-09-03 ENCOUNTER — Ambulatory Visit (HOSPITAL_COMMUNITY)
Admission: RE | Admit: 2013-09-03 | Discharge: 2013-09-03 | Disposition: A | Discharge status: Home / Self Care | Payer: Medicare Other | Source: Ambulatory Visit | Attending: Pulmonary Disease | Admitting: Pulmonary Disease

## 2013-09-03 DIAGNOSIS — IMO0001 Reserved for inherently not codable concepts without codable children: Secondary | ICD-10-CM | POA: Diagnosis not present

## 2013-09-03 NOTE — Progress Notes (Signed)
Physical Therapy Treatment Patient Details  Name: [name] MRN: [id number] Date of Birth: Feb 09, 1934  Today's Date: 09/03/2013 Time: 0930-1015 PT Time Calculation (min): 45 min  Visit#: 8 of 16  Re-eval: 09/05/13 Authorization: [employer]  Authorization Visit#: 8 of 16  Charges;  therex 45  Subjective: Symptoms/Limitations Symptoms: Pt states she is feeling better today; was feeling a little under the weather last visit and canceled appointment.   Pt state she was hurting at 5-6/10 prior to taking a pain pill.  Now without pain after meds.    Pain Assessment Currently in Pain?: No/denies   Exercise/Treatments Stretches Active Hamstring Stretch: Limitations Active Hamstring Stretch Limitations: 3 way to 8" box 10x 3seconds Hip Flexor Stretch: Limitations Hip Flexor Stretch Limitations: 3 way to 8" box 10X3" Aerobic Stationary Bike: nustep level 3 hills, resistance 4 x 10 minutes LE only seat 6 Standing Functional Squats: 10 reps;Limitations Functional Squats Limitations: feet neutral and split stand without UE assistance  Forward Lunge: 10 reps;Limitations Forward Lunge Limitations: onto 4" step without UE assist Side Lunge: 10 reps;Limitations Side Lunge Limitations: onto 4" step Other Standing Lumbar Exercises: 3D hip excursion Other Standing Lumbar Exercises: 3 way dumbell matrix in neutral standing, 2#weights alternating fwd flex, 0# Bil UEabd, and 2# diagonal rotation 3X each     Physical Therapy Assessment and Plan PT Assessment and Plan Clinical Impression Statement: Pt continues to require therapist facilitation for positioning and posturing during therex, however much improved form with squats.  Able to decrease to 4" step today with lunges with good form and no pain.  PT coontinues to progress toward goals.   PT Plan: Continue to progress toward goals.  Progress to gait training on treadmill to imporve posture with gait and  increase activity tolerance.      Problem List Patient Active Problem List   Diagnosis Date Noted  . Lumbago 08/06/2013  . Difficulty in walking(719.7) 08/06/2013  . Bilateral leg weakness 09/23/2012  . Acute renal failure 07/25/2012  . Dehydration 07/25/2012  . DM type 2, uncontrolled, with neuropathy 07/25/2012  . DM (diabetes mellitus), type 2, uncontrolled 07/25/2012  . Essential hypertension, benign 07/25/2012  . Generalized weakness 07/25/2012  . Hyperlipidemia   . Chronic kidney disease   . Skin cancer   . Hypertension   . GERD (gastroesophageal reflux disease)   . Breast cancer 02/19/2012  . CAP (community acquired pneumonia) 04/19/2011  . Nausea and vomiting 04/19/2011  . HTN (hypertension) 04/19/2011  . DM type 2 (diabetes mellitus, type 2) 04/19/2011  . Arthritis 04/19/2011    PT - End of Session Activity Tolerance: Patient tolerated treatment well General Behavior During Therapy: [facility] for tasks assessed/performed   Teena Irani, PTA/CLT 09/03/2013, 11:06 AM

## 2013-09-04 ENCOUNTER — Ambulatory Visit (HOSPITAL_COMMUNITY)
Admission: RE | Admit: 2013-09-04 | Discharge: 2013-09-04 | Disposition: A | Discharge status: Home / Self Care | Payer: Medicare Other | Source: Ambulatory Visit | Attending: Pulmonary Disease | Admitting: Pulmonary Disease

## 2013-09-04 DIAGNOSIS — IMO0001 Reserved for inherently not codable concepts without codable children: Secondary | ICD-10-CM | POA: Diagnosis not present

## 2013-09-04 NOTE — Progress Notes (Signed)
Physical Therapy Treatment Patient Details  Name: [name] MRN: [id number] Date of Birth: Apr 29, 1933  Today's Date: 09/04/2013 Time: N201630 PT Time Calculation (min): 46 min Charge: TE [id number], Gait [id number]  Visit#: 9 of 16  Re-eval: 09/05/13 Assessment Diagnosis: Low back pain secondary to stiffness and weakness Next MD Visit: Vertell Limber couple weeks Prior Therapy: Yes - helpful.   Authorization: [employer]  Authorization Time Period:    Authorization Visit#: 9 of 16   Subjective: Symptoms/Limitations Symptoms: Pt stated back is feeling better today, improved awareness with her posture Pain Assessment Currently in Pain?: No/denies  Objective:   Exercise/Treatments Aerobic Stationary Bike: nustep level 3 hills, resistance 4 x 5 minutes LE only seat 6 Tread Mill: Gait training 8 min @ 0.8-->0.9, step length 51 with multimodal cueing for posture and gait mechanics Standing Functional Squats: 10 reps;Limitations Functional Squats Limitations: feet neutral and staggered stance with Lt LE behind without UE assistance  Other Standing Lumbar Exercises: 3D hip excursion Other Standing Lumbar Exercises: 2D hip excursion walking 2RT with SPC each with therapist faciliation to increase weight shifting to Lt LE and tactile cueing for posture on Lt shoulder      Physical Therapy Assessment and Plan PT Assessment and Plan Clinical Impression Statement: Session focus on improving gait mechanics with gait training on TM and hip mobility excursion walking exercises with therapist faciliation to iincrease weight distrubition on Lt LE during stance phase and tactile cueing on Lt shoulder to improve opsture PT Plan: Continue with current PT POC.  Continue with gait training on treadmill to improve posture and gait mechanics.    Goals PT Short Term Goals PT Short Term Goal 1: Patient will be able to tolerate standing >1 hours without pain >4/10 so patint may stand to cook dinner. PT Short  Term Goal 1 - Progress: Progressing toward goal PT Short Term Goal 2: Patient will be able to walking standing >1 hours with the least restrictive device without pain >4/10 sop patient may perform grocery shopping. PT Short Term Goal 2 - Progress: Progressing toward goal PT Short Term Goal 3: Patient's Hip Internal rotation ROM will improve to greater than 30 degrees bilaterally to improve shock absorbtion during heel strike phase gait. PT Short Term Goal 3 - Progress: Progressing toward goal PT Short Term Goal 4: Patient will demosntrate improve core strength of 4/5 with MMT to improve ability to maintain correct hip alignment for > 1 week to decrease strain on low back. PT Short Term Goal 5: Patient will improve hip extension to 5 degrees bilaterally to increse stride length during gait.  PT Short Term Goal 5 - Progress: Progressing toward goal PT Short Term Goal 6: Patient will improve spine extension mobility to less than 50% limited  Problem List Patient Active Problem List   Diagnosis Date Noted  . Lumbago 08/06/2013  . Difficulty in walking(719.7) 08/06/2013  . Bilateral leg weakness 09/23/2012  . Acute renal failure 07/25/2012  . Dehydration 07/25/2012  . DM type 2, uncontrolled, with neuropathy 07/25/2012  . DM (diabetes mellitus), type 2, uncontrolled 07/25/2012  . Essential hypertension, benign 07/25/2012  . Generalized weakness 07/25/2012  . Hyperlipidemia   . Chronic kidney disease   . Skin cancer   . Hypertension   . GERD (gastroesophageal reflux disease)   . Breast cancer 02/19/2012  . CAP (community acquired pneumonia) 04/19/2011  . Nausea and vomiting 04/19/2011  . HTN (hypertension) 04/19/2011  . DM type 2 (diabetes mellitus, type 2) 04/19/2011  .  Arthritis 04/19/2011    PT - End of Session Equipment Utilized During Treatment: Gait belt Activity Tolerance: Patient tolerated treatment well General Behavior During Therapy: [facility] for tasks  assessed/performed  GP    Aldona Lento 09/04/2013, 11:21 AM

## 2013-09-09 ENCOUNTER — Ambulatory Visit (HOSPITAL_COMMUNITY): Payer: Self-pay

## 2013-09-09 DIAGNOSIS — D519 Vitamin B12 deficiency anemia, unspecified: Secondary | ICD-10-CM | POA: Insufficient documentation

## 2013-09-09 DIAGNOSIS — D509 Iron deficiency anemia, unspecified: Secondary | ICD-10-CM | POA: Insufficient documentation

## 2013-09-10 ENCOUNTER — Ambulatory Visit (HOSPITAL_COMMUNITY)
Admission: RE | Admit: 2013-09-10 | Discharge: 2013-09-10 | Disposition: A | Discharge status: Home / Self Care | Payer: Medicare Other | Source: Ambulatory Visit | Attending: Pulmonary Disease | Admitting: Pulmonary Disease

## 2013-09-10 DIAGNOSIS — IMO0001 Reserved for inherently not codable concepts without codable children: Secondary | ICD-10-CM | POA: Diagnosis not present

## 2013-09-10 NOTE — Progress Notes (Signed)
Physical Therapy Treatment Patient Details  Name: [name] MRN: [id number] Date of Birth: 10/03/1933  Today's Date: 09/10/2013 Time: [id number] PT Time Calculation (min): 40 min Charge: Manual 1108-1125, TE [id number]  Visit#: 10 of 16  Re-eval: 09/05/13    Authorization: [employer]  Authorization Time Period:    Authorization Visit#: 10 of 16   Subjective: Symptoms/Limitations Symptoms: Pt stated Lt LE increased pain in buttocks and hip region. Pain Assessment Currently in Pain?: Yes Pain Score: 7  Pain Location: Back Pain Orientation: Left  Objective:   Exercise/Treatments Stretches Prone on Elbows Stretch: Limitations Prone on Elbows Stretch Limitations: During manual  Press Ups: 5 reps;10 seconds Piriformis Stretch: 30 seconds;Limitations;3 reps Piriformis Stretch Limitations: seated Standing Other Standing Lumbar Exercises: 3D hip excursion Seated Other Seated Lumbar Exercises: 3D thoracic spine excursion 10x  Manual Therapy Manual Therapy: Other (comment) Other Manual Therapy: gluteal compression and piriformis release during prone on elbows for extension  Physical Therapy Assessment and Plan PT Assessment and Plan Clinical Impression Statement: Pt is progressing well towards goals, pt presents with improved IR Bil hips and lumbar extension and able to verbalize importance of good posture to assist in pain control.  MMT not complete this sessoin due to high pain scale initially at session.  Pt reports ability to walk and stand for increased activity tolerance though does continue to walk with trunk forward flexed.  Session focus on manual techniques for pain control.  Gluteal compression and piriformis release assisted for total pain relief upon standing.  Therex focus on improving hip mobility and improving lumbar extension to improve posture.  Manual completed in prone on elbows for lumbar extension and began press ups.   PT Plan: Complete re-assessment next  session wtih MMT and ROM Measurements.  Pt plans for cataract surgery on 09/11/2013, pt has cancelled some of the PT apt following surgery.  Continue with current POC.      Goals PT Short Term Goals PT Short Term Goal 1: Patient will be able to tolerate standing >1 hours without pain >4/10 so patint may stand to cook dinner. PT Short Term Goal 1 - Progress: Progressing toward goal PT Short Term Goal 2: Patient will be able to walking standing >1 hours with the least restrictive device without pain >4/10 sop patient may perform grocery shopping. PT Short Term Goal 2 - Progress: Progressing toward goal PT Short Term Goal 3: Patient's Hip Internal rotation ROM will improve to greater than 30 degrees bilaterally to improve shock absorbtion during heel strike phase gait. PT Short Term Goal 3 - Progress: Progressing toward goal PT Short Term Goal 4: Patient will demosntrate improve core strength of 4/5 with MMT to improve ability to maintain correct hip alignment for > 1 week to decrease strain on low back. PT Short Term Goal 5: Patient will improve hip extension to 5 degrees bilaterally to increse stride length during gait.  PT Short Term Goal 6: Patient will improve spine extension mobility to less than 50% limited- progressing PT Long Term Goals PT Long Term Goal 1: Patient will be able to tolerate standing >2 hours without pain >2/10 PT Long Term Goal 1 - Progress: Progressing toward goal PT Long Term Goal 2: Patient will be able to walking standing >2 hours without pain >2/10 PT Long Term Goal 2 - Progress: Progressing toward goal Long Term Goal 3: Patient will increase hamstring, hip abduction and hip extension strength to 4/5 bilaterally so she can sit to stand without usign her  hands Long Term Goal 3 Progress: Progressing toward goal Long Term Goal 4: Patient will improve whole spine extension mobility to less than 20% limited PT Long Term Goal 5: Patient will increase hamstring, hip abduction  and hip extension strength to 5/5 bilaterally so she can ambulate up and down stairs with one hand rail assist.  Long Term Goal 5 Progress: Progressing toward goal PT Long Term Goal 6: Patient will demosntrate improve LE and  core strength of 5/5 to be bale to perform singleleg standing withotu UE support  Problem List Patient Active Problem List   Diagnosis Date Noted  . Lumbago 08/06/2013  . Difficulty in walking(719.7) 08/06/2013  . Bilateral leg weakness 09/23/2012  . Acute renal failure 07/25/2012  . Dehydration 07/25/2012  . DM type 2, uncontrolled, with neuropathy 07/25/2012  . DM (diabetes mellitus), type 2, uncontrolled 07/25/2012  . Essential hypertension, benign 07/25/2012  . Generalized weakness 07/25/2012  . Hyperlipidemia   . Chronic kidney disease   . Skin cancer   . Hypertension   . GERD (gastroesophageal reflux disease)   . Breast cancer 02/19/2012  . CAP (community acquired pneumonia) 04/19/2011  . Nausea and vomiting 04/19/2011  . HTN (hypertension) 04/19/2011  . DM type 2 (diabetes mellitus, type 2) 04/19/2011  . Arthritis 04/19/2011    PT - End of Session Equipment Utilized During Treatment: Gait belt Activity Tolerance: Patient tolerated treatment well General Behavior During Therapy: [facility] for tasks assessed/performed  GP    Aldona Lento 09/10/2013, 12:05 PM

## 2013-09-11 ENCOUNTER — Ambulatory Visit (HOSPITAL_COMMUNITY): Payer: Self-pay | Admitting: Physical Therapy

## 2013-09-11 NOTE — Progress Notes (Signed)
Physical Therapy Treatment Patient Details  Name: [name] MRN: [id number] Date of Birth: April 04, 1933  Today's Date: 09/10/2013 Time: [id number] PT Time Calculation (min): 40 min Charge: Manual 1108-1125, TE [id number]  Visit#: 10 of 16  Re-eval: 09/05/13    Authorization: [employer]  Authorization Time Period:    Authorization Visit#: 10 of 16   Subjective: Symptoms/Limitations Symptoms: Pt stated Lt LE increased pain in buttocks and hip region. Pain Assessment Currently in Pain?: Yes Pain Score: 7  Pain Location: Back Pain Orientation: Left  Objective:   Exercise/Treatments Stretches Prone on Elbows Stretch: Limitations Prone on Elbows Stretch Limitations: During manual  Press Ups: 5 reps;10 seconds Piriformis Stretch: 30 seconds;Limitations;3 reps Piriformis Stretch Limitations: seated Standing Other Standing Lumbar Exercises: 3D hip excursion Seated Other Seated Lumbar Exercises: 3D thoracic spine excursion 10x  Manual Therapy Manual Therapy: Other (comment) Other Manual Therapy: gluteal compression and piriformis release during prone on elbows for extension  Physical Therapy Assessment and Plan PT Assessment and Plan Clinical Impression Statement: Pt is progressing well towards goals, pt presents with improved IR Bil hips and lumbar extension and able to verbalize importance of good posture to assist in pain control.  MMT not complete this sessoin due to high pain scale initially at session.  Pt reports ability to walk and stand for increased activity tolerance though does continue to walk with trunk forward flexed.  Session focus on manual techniques for pain control.  Gluteal compression and piriformis release assisted for total pain relief upon standing.  Therex focus on improving hip mobility and improving lumbar extension to improve posture.  Manual completed in prone on elbows for lumbar extension and began press ups.   PT Plan: Complete re-assessment next  session wtih MMT and ROM Measurements.  Pt plans for cataract surgery on 09/11/2013, pt has cancelled some of the PT apt following surgery.  Continue with current POC.      Goals PT Short Term Goals PT Short Term Goal 1: Patient will be able to tolerate standing >1 hours without pain >4/10 so patint may stand to cook dinner. PT Short Term Goal 1 - Progress: Progressing toward goal PT Short Term Goal 2: Patient will be able to walking standing >1 hours with the least restrictive device without pain >4/10 sop patient may perform grocery shopping. PT Short Term Goal 2 - Progress: Progressing toward goal PT Short Term Goal 3: Patient's Hip Internal rotation ROM will improve to greater than 30 degrees bilaterally to improve shock absorbtion during heel strike phase gait. PT Short Term Goal 3 - Progress: Progressing toward goal PT Short Term Goal 4: Patient will demosntrate improve core strength of 4/5 with MMT to improve ability to maintain correct hip alignment for > 1 week to decrease strain on low back. PT Short Term Goal 5: Patient will improve hip extension to 5 degrees bilaterally to increse stride length during gait.  PT Short Term Goal 6: Patient will improve spine extension mobility to less than 50% limited- progressing PT Long Term Goals PT Long Term Goal 1: Patient will be able to tolerate standing >2 hours without pain >2/10 PT Long Term Goal 1 - Progress: Progressing toward goal PT Long Term Goal 2: Patient will be able to walking standing >2 hours without pain >2/10 PT Long Term Goal 2 - Progress: Progressing toward goal Long Term Goal 3: Patient will increase hamstring, hip abduction and hip extension strength to 4/5 bilaterally so she can sit to stand without usign her  hands Long Term Goal 3 Progress: Progressing toward goal Long Term Goal 4: Patient will improve whole spine extension mobility to less than 20% limited PT Long Term Goal 5: Patient will increase hamstring, hip abduction  and hip extension strength to 5/5 bilaterally so she can ambulate up and down stairs with one hand rail assist.  Long Term Goal 5 Progress: Progressing toward goal PT Long Term Goal 6: Patient will demosntrate improve LE and  core strength of 5/5 to be bale to perform singleleg standing withotu UE support  Problem List Patient Active Problem List   Diagnosis Date Noted  . Lumbago 08/06/2013  . Difficulty in walking(719.7) 08/06/2013  . Bilateral leg weakness 09/23/2012  . Acute renal failure 07/25/2012  . Dehydration 07/25/2012  . DM type 2, uncontrolled, with neuropathy 07/25/2012  . DM (diabetes mellitus), type 2, uncontrolled 07/25/2012  . Essential hypertension, benign 07/25/2012  . Generalized weakness 07/25/2012  . Hyperlipidemia   . Chronic kidney disease   . Skin cancer   . Hypertension   . GERD (gastroesophageal reflux disease)   . Breast cancer 02/19/2012  . CAP (community acquired pneumonia) 04/19/2011  . Nausea and vomiting 04/19/2011  . HTN (hypertension) 04/19/2011  . DM type 2 (diabetes mellitus, type 2) 04/19/2011  . Arthritis 04/19/2011    PT - End of Session Equipment Utilized During Treatment: Gait belt Activity Tolerance: Patient tolerated treatment well General Behavior During Therapy: [facility] for tasks assessed/performed  GP    Aldona Lento 09/10/2013, 12:05 PM  Devona Konig PT DPT 09/11/13

## 2013-09-15 ENCOUNTER — Ambulatory Visit (HOSPITAL_COMMUNITY): Payer: Self-pay

## 2013-09-17 ENCOUNTER — Ambulatory Visit (HOSPITAL_COMMUNITY): Payer: Self-pay | Admitting: Physical Therapy

## 2013-09-17 ENCOUNTER — Telehealth (HOSPITAL_COMMUNITY): Payer: Self-pay

## 2013-09-19 ENCOUNTER — Ambulatory Visit (HOSPITAL_COMMUNITY): Payer: Self-pay | Admitting: Physical Therapy

## 2013-09-22 ENCOUNTER — Ambulatory Visit (HOSPITAL_COMMUNITY): Payer: Self-pay | Admitting: Physical Therapy

## 2013-09-24 ENCOUNTER — Ambulatory Visit (HOSPITAL_COMMUNITY): Payer: Self-pay

## 2013-09-26 ENCOUNTER — Ambulatory Visit (HOSPITAL_COMMUNITY): Payer: Self-pay | Admitting: Physical Therapy

## 2013-10-22 ENCOUNTER — Ambulatory Visit: Payer: Self-pay | Admitting: Gastroenterology

## 2013-10-29 ENCOUNTER — Other Ambulatory Visit: Payer: Self-pay

## 2013-10-29 ENCOUNTER — Other Ambulatory Visit: Payer: Self-pay | Admitting: Internal Medicine

## 2013-10-29 ENCOUNTER — Ambulatory Visit (INDEPENDENT_AMBULATORY_CARE_PROVIDER_SITE_OTHER): Payer: Medicare Other | Admitting: Gastroenterology

## 2013-10-29 ENCOUNTER — Telehealth: Payer: Self-pay | Admitting: Gastroenterology

## 2013-10-29 ENCOUNTER — Encounter (INDEPENDENT_AMBULATORY_CARE_PROVIDER_SITE_OTHER): Payer: Self-pay

## 2013-10-29 ENCOUNTER — Encounter: Payer: Self-pay | Admitting: Gastroenterology

## 2013-10-29 ENCOUNTER — Other Ambulatory Visit: Payer: Self-pay | Admitting: Gastroenterology

## 2013-10-29 VITALS — BP 110/63 | HR 99 | Temp 97.6°F | Ht 61.0 in | Wt 167.8 lb

## 2013-10-29 DIAGNOSIS — D509 Iron deficiency anemia, unspecified: Secondary | ICD-10-CM

## 2013-10-29 DIAGNOSIS — R935 Abnormal findings on diagnostic imaging of other abdominal regions, including retroperitoneum: Secondary | ICD-10-CM

## 2013-10-29 LAB — CBC
HEMATOCRIT: 31 % — AB (ref 36.0–46.0)
Hemoglobin: 9.4 g/dL — ABNORMAL LOW (ref 12.0–15.0)
MCH: 23.3 pg — ABNORMAL LOW (ref 26.0–34.0)
MCHC: 30.3 g/dL (ref 30.0–36.0)
MCV: 76.7 fL — AB (ref 78.0–100.0)
Platelets: 217 10*3/uL (ref 150–400)
RBC: 4.04 MIL/uL (ref 3.87–5.11)
RDW: 20.8 % — ABNORMAL HIGH (ref 11.5–15.5)
WBC: 6.1 10*3/uL (ref 4.0–10.5)

## 2013-10-29 MED ORDER — PEG 3350-KCL-NA BICARB-NACL 420 G PO SOLR: 4000.0000 mL | ORAL | Status: DC

## 2013-10-29 NOTE — Telephone Encounter (Signed)
Pt is scheduled for [place] on Sept. 10 @ 8:45 and she is aware

## 2013-10-29 NOTE — Progress Notes (Signed)
Cc to pcp °

## 2013-10-29 NOTE — Progress Notes (Signed)
Primary Care Physician:  Alonza Bogus, MD Primary Gastroenterologist:  Dr. Gala Romney  Chief Complaint  Patient presents with  . Anemia    iron deficiency    HPI:   [name] is a very pleasant 80 year old female presenting today at the request of Dr. Tressie Stalker secondary to [place]. Recent Hgb in Aug 2015 8 per outside labs. Ferritin low at in 4 range. No hematochezia or melena. No colonoscopy or upper endoscopy. Prilosec each evening for GERD. No dysphagia. No constipation or diarrhea. No changes in bowel habits. No FH of colon cancer. Has required 2 IV iron infusions.  Past Medical History  Diagnosis Date  . Diabetes mellitus   . Arthritis   . Hyperlipidemia   . Mental disorder   . Chronic kidney disease     renal stones, one episode of Lithotripsy  . GERD (gastroesophageal reflux disease)   . Skin cancer     face- melanoma, 2012- surg. excision    . Breast cancer, left     er/pr +  . Hypertension     followed by [facility]. relative to  urology study grp.  Doesn't report ever having a stress test   . Complication of anesthesia   . PONV (postoperative nausea and vomiting)   . Cataract     no surgery as yet,starting of 3 years  . Seasonal allergies     Past Surgical History  Procedure Laterality Date  . Cholecystectomy    . Abdominal hysterectomy    . Skin biopsy    . Shoulder surgery    . Tubal ligation    . Back surgery      x4 back surgery to include rods & screws  . Last in May 2014  . Fracture surgery      1975- ORIF- L ankle   . Breast lumpectomy with needle localization and axillary sentinel lymph node bx  03/12/2012    Procedure: BREAST LUMPECTOMY WITH NEEDLE LOCALIZATION AND AXILLARY SENTINEL LYMPH NODE BX;  Surgeon: Merrie Roof, MD;  Location: [facility];  Service: General;  Laterality: Left;  . Re-excision of breast lumpectomy  03/22/2012    Procedure: RE-EXCISION OF BREAST LUMPECTOMY;  Surgeon: Merrie Roof, MD;  Location: [facility];  Service: General;  Laterality: Left;  re-excision left breast lumpectomy cavity  ER+, PR+  . Rotator cuff repair Bilateral   . Breast surgery      lumpectomy x2    Current Outpatient Prescriptions  Medication Sig Dispense Refill  . amLODipine (NORVASC) 10 MG tablet Take 10 mg by mouth every morning.       [name] Kitchen anastrozole (ARIMIDEX) 1 MG tablet Take 1 mg by mouth daily with breakfast.      . aspirin EC 81 MG tablet Take 81 mg by mouth daily.       [name] Kitchen atorvastatin (LIPITOR) 10 MG tablet Take 10 mg by mouth at bedtime.      . benazepril (LOTENSIN) 20 MG tablet Take 20 mg by mouth every morning.       [name] Kitchen CALCIUM-VITAMIN D PO Take 1 tablet by mouth daily. 1200 mg/500 mg      . Cholecalciferol (VITAMIN D) 2000 UNITS CAPS Take 1 capsule by mouth.      . cyclobenzaprine (FLEXERIL) 5 MG tablet Take 5 mg by mouth every 8 (eight) hours as needed for muscle spasms.      [name] Kitchen docusate sodium (COLACE) 100 MG capsule Take 100  mg by mouth 2 (two) times daily as needed for constipation.       . DULoxetine (CYMBALTA) 60 MG capsule Take 60 mg by mouth at bedtime.       [name] Kitchen EPINEPHrine (EPIPEN 2-PAK) 0.3 mg/0.3 mL DEVI Inject 0.3 mg into the muscle once as needed. For anaphylaxis      . gabapentin (NEURONTIN) 300 MG capsule Take 300 mg by mouth 3 (three) times daily.      [name] Kitchen glipiZIDE-metformin (METAGLIP) 2.5-500 MG per tablet       . glucosamine-chondroitin 500-400 MG tablet Take 1 tablet by mouth.      . hydrALAZINE (APRESOLINE) 25 MG tablet Take 25 mg by mouth 3 (three) times daily.       . insulin glargine (LANTUS SOLOSTAR) 100 UNIT/ML injection Inject 60 Units into the skin every morning.       . metoprolol succinate (TOPROL-XL) 100 MG 24 hr tablet Take 100 mg by mouth daily. Take with or immediately following a meal.      . Naphazoline-Pheniramine (OPCON-A OP) Place 1 drop into both eyes daily as needed. For dry eyes      . omeprazole (PRILOSEC) 20 MG capsule Take 20 mg by mouth at bedtime.       [name] Kitchen  oxyCODONE (OXY IR/ROXICODONE) 5 MG immediate release tablet Take 10 mg by mouth every 3 (three) hours as needed for pain.       . sitaGLIPtin (JANUVIA) 100 MG tablet Take 100 mg by mouth every morning.       . denosumab (PROLIA) 60 MG/ML SOLN injection Inject 60 mg into the skin every 6 (six) months. Administer in upper arm, thigh, or abdomen       No current facility-administered medications for this visit.    Allergies as of 10/29/2013 - Review Complete 10/29/2013  Allergen Reaction Noted  . Demerol Nausea Only 11/11/2010  . Other Other (See Comments) 05/04/2011    Family History  Problem Relation Age of Onset  . Heart disease Father   . Colon cancer Neg Hx     History   Social History  . Marital Status: Married    Spouse Name: N/A    Number of Children: N/A  . Years of Education: N/A   Occupational History  . retired     Orthoptist for 30 years   Social History Main Topics  . Smoking status: Never Smoker   . Smokeless tobacco: Never Used  . Alcohol Use: No  . Drug Use: No  . Sexual Activity: Yes     Comment: GX p2,  1st live birth 50   Other Topics Concern  . Not on file   Social History Narrative  . No narrative on file    Review of Systems: Gen: Denies any fever, chills, fatigue, weight loss, lack of appetite.  CV: Denies chest pain, heart palpitations, peripheral edema, syncope.  Resp: Denies shortness of breath at rest or with exertion. Denies wheezing or cough.  GI: see HPI GU : +nocturnal frequency MS: +joint pain Derm: Denies rash, itching, dry skin Psych: Denies depression, anxiety, memory loss, and confusion Heme: Denies bruising, bleeding, and enlarged lymph nodes.  Physical Exam: BP 110/63  Pulse 99  Temp(Src) 97.6 F (36.4 C) (Oral)  Ht 5\' 1"  (1.549 m)  Wt 167 lb 12.8 oz (76.114 kg)  BMI 31.72 kg/m2 General:   Alert and oriented. Pleasant and cooperative. Well-nourished and well-developed.  Head:  Normocephalic and  atraumatic. Eyes:  Without  icterus, sclera clear and conjunctiva pink.  Ears:  Normal auditory acuity. Nose:  No deformity, discharge,  or lesions. Mouth:  No deformity or lesions, oral mucosa pink.  Lungs:  Clear to auscultation bilaterally. No wheezes, rales, or rhonchi. No distress.  Heart:  S1, S2 present without murmurs appreciated.  Abdomen:  +BS, soft, non-tender and non-distended. Liver border appreciated with deep palpation. Query small umbilical/ventra hernia Rectal:  Deferred  Msk:  Symmetrical without gross deformities. Normal posture. Extremities:  Trace lower extremity edema Neurologic:  Alert and  oriented x4;  grossly normal neurologically. Skin:  Intact without significant lesions or rashes. Psych:  Alert and cooperative. Normal mood and affect.  CT Feb 2013:  1. No acute findings identified within the abdomen or pelvis.  2. The liver has a mildly nodular contour. Correlate for any  clinical signs or symptoms of cirrhosis.  3. Extensive postop changes within the lumbar spine with marked  progression of L1-2 degenerative disc disease.

## 2013-10-29 NOTE — Assessment & Plan Note (Signed)
80 year old female with IDA, last hgb in 8 range, ferritin 4, requiring 2 IV iron infusions per Hematology. No overt signs of GI bleeding. Asymptomatic currently. No prior lower or upper GI evaluations. Needs colonoscopy +/- EGD with Dr. Gala Romney in the near future.   Recheck CBC today Proceed with TCS/EGD with Dr. Gala Romney in near future: the risks, benefits, and alternatives have been discussed with the patient in detail. The patient states understanding and desires to proceed. [place] with elastography due to question of nodular liver contour on CT in Feb 2013

## 2013-10-29 NOTE — Telephone Encounter (Signed)
After patient left, I was reviewing old imaging. CT in 2013 with question of nodular liver.   Let's set up [place] elastography for further assessment. Thanks!

## 2013-10-29 NOTE — Patient Instructions (Signed)
We have scheduled you for a colonoscopy and possible upper endoscopy with Dr. Gala Romney in the near future.  Further recommendations to follow!

## 2013-10-30 NOTE — Telephone Encounter (Signed)
PATIENT CALLED STATING SHE WAS RETURNING CALL. ALSO HAD QUESTIONS ABOUT HER RADIOLOGY APPOINTMENT  PLEASE CALL HER BACK AT [phone]

## 2013-10-30 NOTE — Telephone Encounter (Signed)
I Returned Mrs. Palmers call and gave her instructions again and she understood

## 2013-11-04 ENCOUNTER — Other Ambulatory Visit: Payer: Self-pay | Admitting: Internal Medicine

## 2013-11-04 ENCOUNTER — Telehealth: Payer: Self-pay | Admitting: Internal Medicine

## 2013-11-04 NOTE — Progress Notes (Signed)
Quick Note:  Hgb stable at 9.4. This is improved from 8 that was documented on outside labs. No need for blood or any change. Proceed as planned. ______

## 2013-11-04 NOTE — Telephone Encounter (Signed)
Please see result note 

## 2013-11-04 NOTE — Telephone Encounter (Signed)
I spoke with the pt.  

## 2013-11-04 NOTE — Telephone Encounter (Signed)
PATIENT CALLING FOR LAB RESULTS, PLEASE ADVISE?

## 2013-11-04 NOTE — Telephone Encounter (Signed)
Routing to AS 

## 2013-11-05 ENCOUNTER — Encounter (HOSPITAL_COMMUNITY): Payer: Self-pay | Admitting: Pharmacy Technician

## 2013-11-06 ENCOUNTER — Ambulatory Visit (HOSPITAL_COMMUNITY): Payer: Medicare Other

## 2013-11-06 NOTE — Addendum Note (Signed)
Encounter addended by: Leia Alf, PT on: 11/06/2013  5:49 PM<BR>     Documentation filed: Clinical Notes, Flowsheet VN

## 2013-11-12 ENCOUNTER — Ambulatory Visit (HOSPITAL_COMMUNITY)
Admission: RE | Admit: 2013-11-12 | Discharge: 2013-11-12 | Disposition: A | Discharge status: Home / Self Care | Payer: Medicare Other | Source: Ambulatory Visit | Attending: Gastroenterology | Admitting: Gastroenterology

## 2013-11-12 DIAGNOSIS — R935 Abnormal findings on diagnostic imaging of other abdominal regions, including retroperitoneum: Secondary | ICD-10-CM

## 2013-11-13 ENCOUNTER — Telehealth: Payer: Self-pay | Admitting: Internal Medicine

## 2013-11-13 NOTE — Telephone Encounter (Signed)
PATIENT CALLED WITH QUESTIONS ON THE ULTRASOUND SHE HAD YESTERDAY.  PLEASE CALL HER AT [id number] CELL [phone]

## 2013-11-14 ENCOUNTER — Telehealth: Payer: Self-pay | Admitting: Internal Medicine

## 2013-11-14 NOTE — Telephone Encounter (Signed)
PLEASE CALL PATIENT, WANTING TO KNOW RESULTS OF HER ULTRASOUND.  [id number] CELL [phone]

## 2013-11-17 ENCOUNTER — Telehealth: Payer: Self-pay | Admitting: Internal Medicine

## 2013-11-17 NOTE — Telephone Encounter (Signed)
I spoke with the pt re: u/s results.

## 2013-11-17 NOTE — Progress Notes (Signed)
Quick Note:  Metavir score 4, likely cirrhosis. She is already scheduled for an EGD at time of colonoscopy.  Needs appt as outpatient in next 6-8 weeks to set up cirrhosis care (vaccinations, serial ultrasounds, labs as appropriate). ______

## 2013-11-17 NOTE — Telephone Encounter (Signed)
Routing to AS 

## 2013-11-17 NOTE — Telephone Encounter (Signed)
PATIENT CAME BY INQUIRING ON ULTRASOUND RESULTS AGAIN

## 2013-11-17 NOTE — Telephone Encounter (Signed)
Result note 

## 2013-11-18 ENCOUNTER — Ambulatory Visit (INDEPENDENT_AMBULATORY_CARE_PROVIDER_SITE_OTHER): Payer: Medicare Other | Admitting: General Surgery

## 2013-11-18 ENCOUNTER — Encounter: Payer: Self-pay | Admitting: Internal Medicine

## 2013-11-18 NOTE — Progress Notes (Signed)
APPT MADE AND LETTER SENT  °

## 2013-11-20 ENCOUNTER — Encounter (HOSPITAL_COMMUNITY): Payer: Self-pay | Admitting: *Deleted

## 2013-11-20 ENCOUNTER — Ambulatory Visit (HOSPITAL_COMMUNITY)
Admission: RE | Admit: 2013-11-20 | Discharge: 2013-11-20 | Disposition: A | Discharge status: Home / Self Care | Payer: Medicare Other | Source: Ambulatory Visit | Attending: Internal Medicine | Admitting: Internal Medicine

## 2013-11-20 ENCOUNTER — Encounter (HOSPITAL_COMMUNITY)
Admission: RE | Disposition: A | Discharge status: Home / Self Care | Payer: Self-pay | Source: Ambulatory Visit | Attending: Internal Medicine

## 2013-11-20 DIAGNOSIS — Z794 Long term (current) use of insulin: Secondary | ICD-10-CM | POA: Insufficient documentation

## 2013-11-20 DIAGNOSIS — Z853 Personal history of malignant neoplasm of breast: Secondary | ICD-10-CM | POA: Diagnosis not present

## 2013-11-20 DIAGNOSIS — K2940 Chronic atrophic gastritis without bleeding: Secondary | ICD-10-CM | POA: Insufficient documentation

## 2013-11-20 DIAGNOSIS — Z85820 Personal history of malignant melanoma of skin: Secondary | ICD-10-CM | POA: Insufficient documentation

## 2013-11-20 DIAGNOSIS — I129 Hypertensive chronic kidney disease with stage 1 through stage 4 chronic kidney disease, or unspecified chronic kidney disease: Secondary | ICD-10-CM | POA: Diagnosis not present

## 2013-11-20 DIAGNOSIS — N189 Chronic kidney disease, unspecified: Secondary | ICD-10-CM | POA: Diagnosis not present

## 2013-11-20 DIAGNOSIS — Z7982 Long term (current) use of aspirin: Secondary | ICD-10-CM | POA: Diagnosis not present

## 2013-11-20 DIAGNOSIS — E119 Type 2 diabetes mellitus without complications: Secondary | ICD-10-CM | POA: Diagnosis not present

## 2013-11-20 DIAGNOSIS — K319 Disease of stomach and duodenum, unspecified: Secondary | ICD-10-CM

## 2013-11-20 DIAGNOSIS — K5730 Diverticulosis of large intestine without perforation or abscess without bleeding: Secondary | ICD-10-CM | POA: Diagnosis not present

## 2013-11-20 DIAGNOSIS — Z79899 Other long term (current) drug therapy: Secondary | ICD-10-CM | POA: Diagnosis not present

## 2013-11-20 DIAGNOSIS — D509 Iron deficiency anemia, unspecified: Secondary | ICD-10-CM | POA: Diagnosis present

## 2013-11-20 DIAGNOSIS — E785 Hyperlipidemia, unspecified: Secondary | ICD-10-CM | POA: Diagnosis not present

## 2013-11-20 DIAGNOSIS — K3189 Other diseases of stomach and duodenum: Secondary | ICD-10-CM

## 2013-11-20 HISTORY — PX: COLONOSCOPY: SHX5424

## 2013-11-20 HISTORY — PX: ESOPHAGOGASTRODUODENOSCOPY: SHX5428

## 2013-11-20 LAB — GLUCOSE, CAPILLARY: GLUCOSE-CAPILLARY: 99 mg/dL (ref 70–99)

## 2013-11-20 SURGERY — COLONOSCOPY
Anesthesia: Moderate Sedation

## 2013-11-20 MED ORDER — ONDANSETRON HCL 4 MG/2ML IJ SOLN
INTRAMUSCULAR | Status: AC
  Filled 2013-11-20: qty 2

## 2013-11-20 MED ORDER — SIMETHICONE 40 MG/0.6ML PO SUSP
ORAL | Status: DC | PRN
  Administered 2013-11-20: 13:00:00

## 2013-11-20 MED ORDER — FENTANYL CITRATE 0.05 MG/ML IJ SOLN
INTRAMUSCULAR | Status: DC | PRN
  Administered 2013-11-20 (×4): 25 ug via INTRAVENOUS
  Administered 2013-11-20: 50 ug via INTRAVENOUS

## 2013-11-20 MED ORDER — SODIUM CHLORIDE 0.9 % IV SOLN
INTRAVENOUS | Status: DC
  Administered 2013-11-20: 12:00:00 via INTRAVENOUS

## 2013-11-20 MED ORDER — FENTANYL CITRATE 0.05 MG/ML IJ SOLN
INTRAMUSCULAR | Status: AC
  Filled 2013-11-20: qty 4

## 2013-11-20 MED ORDER — ONDANSETRON HCL 4 MG/2ML IJ SOLN
INTRAMUSCULAR | Status: DC | PRN
  Administered 2013-11-20: 4 mg via INTRAVENOUS

## 2013-11-20 MED ORDER — MIDAZOLAM HCL 5 MG/5ML IJ SOLN
INTRAMUSCULAR | Status: DC | PRN
  Administered 2013-11-20: 1 mg via INTRAVENOUS
  Administered 2013-11-20: 2 mg via INTRAVENOUS
  Administered 2013-11-20 (×4): 1 mg via INTRAVENOUS

## 2013-11-20 MED ORDER — MIDAZOLAM HCL 5 MG/5ML IJ SOLN
INTRAMUSCULAR | Status: AC
  Filled 2013-11-20: qty 10

## 2013-11-20 MED ORDER — LIDOCAINE VISCOUS 2 % MT SOLN
OROMUCOSAL | Status: AC
  Filled 2013-11-20: qty 15

## 2013-11-20 MED ORDER — LIDOCAINE VISCOUS 2 % MT SOLN
OROMUCOSAL | Status: DC | PRN
  Administered 2013-11-20: 3 mL via OROMUCOSAL

## 2013-11-20 NOTE — Op Note (Signed)
[facility] [address] [place], [address]   ENDOSCOPY PROCEDURE REPORT  PATIENT: [name], [name]  MR#: [id number] BIRTHDATE: [birth year] , 79  yrs. old GENDER: female ENDOSCOPIST: R.  Garfield Cornea, MD FACP [facility] REFERRED BY:  Sinda Du, M.D.  Everardo All, M.D. PROCEDURE DATE:  11/20/2013 PROCEDURE:  EGD w/ biopsy INDICATIONS:  iron deficiency anemia.  ; negative colonoscopy MEDICATIONS: Midazolam (Versed) 7 mg IV and Fentanyl 150 mcg IV Xylocaine gel orally. A Zofran 4 mg IV. ASA CLASS:      Class II  CONSENT: The risks, benefits, limitations, alternatives and imponderables have been discussed.  The potential for biopsy, esophogeal dilation, etc. have also been reviewed.  Questions have been answered.  All parties agreeable.  Please see the history and physical in the medical record for more information.  DESCRIPTION OF PROCEDURE: After the risks benefits and alternatives of the procedure were thoroughly explained, informed consent was obtained.  The [id number] endoscope was introduced through the mouth and advanced to the third portion of the duodenum , limited by Without limitations.   The abnormal gastric mucosa was biopsied for histologic study.  The instrument was slowly withdrawn as the mucosa was fully examined.      STOMACH: Abnormal mucosa was found. Diffuse snake skinning and friability of the gastric mucosa. No ulcer and training process observed. Patent pylorus. Abnormal-appearing first, second and third portion of the duodenum. There was a 5 mm gastric nodule at the GE junction just on the gastric side seen best retroflexed. Retroflexed views revealed as described..     The scope was then withdrawn from the patient and the procedure completed.  COMPLICATIONS: There were no complications.  ENDOSCOPIC IMPRESSION: Abnormal mucosa was found   - stomach  -  gastric nodule EG junction. Mucosa and nodule biopsied  separately.  RECOMMENDATIONS: Await pathology results    See colonoscopy report.  REPEAT EXAM:  eSigned:  R. Garfield Cornea, MD FACP [facility] 11/20/2013 1:56 PM    CC:  PATIENT NAME:  [name], [name] MR#: [id number]

## 2013-11-20 NOTE — H&P (View-Only) (Signed)
Primary Care Physician:  Alonza Bogus, MD Primary Gastroenterologist:  Dr. Gala Romney  Chief Complaint  Patient presents with  . Anemia    iron deficiency    HPI:   [name] is a very pleasant 80 year old female presenting today at the request of Dr. Tressie Stalker secondary to [place]. Recent Hgb in Aug 2015 8 per outside labs. Ferritin low at in 4 range. No hematochezia or melena. No colonoscopy or upper endoscopy. Prilosec each evening for GERD. No dysphagia. No constipation or diarrhea. No changes in bowel habits. No FH of colon cancer. Has required 2 IV iron infusions.  Past Medical History  Diagnosis Date  . Diabetes mellitus   . Arthritis   . Hyperlipidemia   . Mental disorder   . Chronic kidney disease     renal stones, one episode of Lithotripsy  . GERD (gastroesophageal reflux disease)   . Skin cancer     face- melanoma, 2012- surg. excision    . Breast cancer, left     er/pr +  . Hypertension     followed by [facility] gr[facility]. relative to  urology study grp.  Doesn't report ever having a stress test   . Complication of anesthesia   . PONV (postoperative nausea and vomiting)   . Cataract     no surgery as yet,starting of 3 years  . Seasonal allergies     Past Surgical History  Procedure Laterality Date  . Cholecystectomy    . Abdominal hysterectomy    . Skin biopsy    . Shoulder surgery    . Tubal ligation    . Back surgery      x4 back surgery to include rods & screws  . Last in May 2014  . Fracture surgery      1975- ORIF- L ankle   . Breast lumpectomy with needle localization and axillary sentinel lymph node bx  03/12/2012    Procedure: BREAST LUMPECTOMY WITH NEEDLE LOCALIZATION AND AXILLARY SENTINEL LYMPH NODE BX;  Surgeon: Merrie Roof, MD;  Location: [facility];  Service: General;  Laterality: Left;  . Re-excision of breast lumpectomy  03/22/2012    Procedure: RE-EXCISION OF BREAST LUMPECTOMY;  Surgeon: Merrie Roof, MD;  Location: [facility];  Service: General;  Laterality: Left;  re-excision left breast lumpectomy cavity  ER+, PR+  . Rotator cuff repair Bilateral   . Breast surgery      lumpectomy x2    Current Outpatient Prescriptions  Medication Sig Dispense Refill  . amLODipine (NORVASC) 10 MG tablet Take 10 mg by mouth every morning.       [name] Kitchen anastrozole (ARIMIDEX) 1 MG tablet Take 1 mg by mouth daily with breakfast.      . aspirin EC 81 MG tablet Take 81 mg by mouth daily.       [name] Kitchen atorvastatin (LIPITOR) 10 MG tablet Take 10 mg by mouth at bedtime.      . benazepril (LOTENSIN) 20 MG tablet Take 20 mg by mouth every morning.       [name] Kitchen CALCIUM-VITAMIN D PO Take 1 tablet by mouth daily. 1200 mg/500 mg      . Cholecalciferol (VITAMIN D) 2000 UNITS CAPS Take 1 capsule by mouth.      . cyclobenzaprine (FLEXERIL) 5 MG tablet Take 5 mg by mouth every 8 (eight) hours as needed for muscle spasms.      [name] Kitchen docusate sodium (COLACE) 100 MG capsule Take 100  mg by mouth 2 (two) times daily as needed for constipation.       . DULoxetine (CYMBALTA) 60 MG capsule Take 60 mg by mouth at bedtime.       [name] Kitchen EPINEPHrine (EPIPEN 2-PAK) 0.3 mg/0.3 mL DEVI Inject 0.3 mg into the muscle once as needed. For anaphylaxis      . gabapentin (NEURONTIN) 300 MG capsule Take 300 mg by mouth 3 (three) times daily.      [name] Kitchen glipiZIDE-metformin (METAGLIP) 2.5-500 MG per tablet       . glucosamine-chondroitin 500-400 MG tablet Take 1 tablet by mouth.      . hydrALAZINE (APRESOLINE) 25 MG tablet Take 25 mg by mouth 3 (three) times daily.       . insulin glargine (LANTUS SOLOSTAR) 100 UNIT/ML injection Inject 60 Units into the skin every morning.       . metoprolol succinate (TOPROL-XL) 100 MG 24 hr tablet Take 100 mg by mouth daily. Take with or immediately following a meal.      . Naphazoline-Pheniramine (OPCON-A OP) Place 1 drop into both eyes daily as needed. For dry eyes      . omeprazole (PRILOSEC) 20 MG capsule Take 20 mg by mouth at bedtime.       [name] Kitchen  oxyCODONE (OXY IR/ROXICODONE) 5 MG immediate release tablet Take 10 mg by mouth every 3 (three) hours as needed for pain.       . sitaGLIPtin (JANUVIA) 100 MG tablet Take 100 mg by mouth every morning.       . denosumab (PROLIA) 60 MG/ML SOLN injection Inject 60 mg into the skin every 6 (six) months. Administer in upper arm, thigh, or abdomen       No current facility-administered medications for this visit.    Allergies as of 10/29/2013 - Review Complete 10/29/2013  Allergen Reaction Noted  . Demerol Nausea Only 11/11/2010  . Other Other (See Comments) 05/04/2011    Family History  Problem Relation Age of Onset  . Heart disease Father   . Colon cancer Neg Hx     History   Social History  . Marital Status: Married    Spouse Name: N/A    Number of Children: N/A  . Years of Education: N/A   Occupational History  . retired     Orthoptist for 30 years   Social History Main Topics  . Smoking status: Never Smoker   . Smokeless tobacco: Never Used  . Alcohol Use: No  . Drug Use: No  . Sexual Activity: Yes     Comment: GX p2,  1st live birth 73   Other Topics Concern  . Not on file   Social History Narrative  . No narrative on file    Review of Systems: Gen: Denies any fever, chills, fatigue, weight loss, lack of appetite.  CV: Denies chest pain, heart palpitations, peripheral edema, syncope.  Resp: Denies shortness of breath at rest or with exertion. Denies wheezing or cough.  GI: see HPI GU : +nocturnal frequency MS: +joint pain Derm: Denies rash, itching, dry skin Psych: Denies depression, anxiety, memory loss, and confusion Heme: Denies bruising, bleeding, and enlarged lymph nodes.  Physical Exam: BP 110/63  Pulse 99  Temp(Src) 97.6 F (36.4 C) (Oral)  Ht 5\' 1"  (1.549 m)  Wt 167 lb 12.8 oz (76.114 kg)  BMI 31.72 kg/m2 General:   Alert and oriented. Pleasant and cooperative. Well-nourished and well-developed.  Head:  Normocephalic and  atraumatic. Eyes:  Without  icterus, sclera clear and conjunctiva pink.  Ears:  Normal auditory acuity. Nose:  No deformity, discharge,  or lesions. Mouth:  No deformity or lesions, oral mucosa pink.  Lungs:  Clear to auscultation bilaterally. No wheezes, rales, or rhonchi. No distress.  Heart:  S1, S2 present without murmurs appreciated.  Abdomen:  +BS, soft, non-tender and non-distended. Liver border appreciated with deep palpation. Query small umbilical/ventra hernia Rectal:  Deferred  Msk:  Symmetrical without gross deformities. Normal posture. Extremities:  Trace lower extremity edema Neurologic:  Alert and  oriented x4;  grossly normal neurologically. Skin:  Intact without significant lesions or rashes. Psych:  Alert and cooperative. Normal mood and affect.  CT Feb 2013:  1. No acute findings identified within the abdomen or pelvis.  2. The liver has a mildly nodular contour. Correlate for any  clinical signs or symptoms of cirrhosis.  3. Extensive postop changes within the lumbar spine with marked  progression of L1-2 degenerative disc disease.

## 2013-11-20 NOTE — Op Note (Signed)
[facility] [address] [place], [address]   COLONOSCOPY PROCEDURE REPORT  PATIENT: [name], [name]  MR#: [id number] BIRTHDATE: 1933-05-29 , 79  yrs. old GENDER: female ENDOSCOPIST: R.  Garfield Cornea, MD FACP [facility] REFERRED [id number] Luan Pulling, M.D.  Everardo All, M.D. PROCEDURE DATE:  11/20/2013 PROCEDURE:   Colonoscopy, diagnostic INDICATIONS:iron deficiency anemia.   ; first ever colonoscopy MEDICATIONS: Versed 5 mg IV and Fentanyl 100 mcg IV ASA CLASS:       Class II  CONSENT: The risks, benefits, alternatives and imponderables including but not limited to bleeding, perforation as well as the possibility of a missed lesion have been reviewed.  The potential for biopsy, lesion removal, etc. have also been discussed. Questions have been answered.  All parties agreeable.  Please see the history and physical in the medical record for more information.  DESCRIPTION OF PROCEDURE:   After the risks benefits and alternatives of the procedure were thoroughly explained, informed consent was obtained.  The digital rectal exam      The [id number]  endoscope was introduced through the anus and advanced to the terminal ileum which was intubated for a short distance. No adverse events experienced.   The quality of the prep was adequate. The instrument was then slowly withdrawn as the colon was fully examined.      COLON FINDINGS: Diverticula was found in the left colon. Retroflexed views revealed no abnormalities. .  Otherwise, normal rectal, colonic and terminal ileal mucosa.  Withdrawal time=8 minutes 0 seconds.  The scope was withdrawn and the procedure completed. COMPLICATIONS: There were no complications.  ENDOSCOPIC IMPRESSION: Diverticula in the left colon  RECOMMENDATIONS: Proceed with EGD.  See EGD report.  eSigned:  R. Garfield Cornea, MD FACP [facility] 11/20/2013 1:38 PM   cc:

## 2013-11-20 NOTE — Interval H&P Note (Signed)
History and Physical Interval Note:  11/20/2013 1:00 PM  [name]  has presented today for surgery, with the diagnosis of IDA  The various methods of treatment have been discussed with the patient and family. After consideration of risks, benefits and other options for treatment, the patient has consented to  Procedure(s) with comments: COLONOSCOPY (N/A) - 1245 ESOPHAGOGASTRODUODENOSCOPY (EGD) (N/A) as a surgical intervention .  The patient's history has been reviewed, patient examined, no change in status, stable for surgery.  I have reviewed the patient's chart and labs.  Questions were answered to the patient's satisfaction.     No change. Colonoscopy with possible EGD to follow.The risks, benefits, limitations, imponderables and alternatives regarding both EGD and colonoscopy have been reviewed with the patient. Questions have been answered. All parties agreeable.       Manus Rudd

## 2013-11-20 NOTE — Discharge Instructions (Addendum)
Colonoscopy Discharge Instructions  Read the instructions outlined below and refer to this sheet in the next few weeks. These discharge instructions provide you with general information on caring for yourself after you leave the hospital. Your doctor may also give you specific instructions. While your treatment has been planned according to the most current medical practices available, unavoidable complications occasionally occur. If you have any problems or questions after discharge, call Dr. Gala Romney at [phone]. ACTIVITY  You may resume your regular activity, but move at a slower pace for the next 24 hours.   Take frequent rest periods for the next 24 hours.   Walking will help get rid of the air and reduce the bloated feeling in your belly (abdomen).   No driving for 24 hours (because of the medicine (anesthesia) used during the test).    Do not sign any important legal documents or operate any machinery for 24 hours (because of the anesthesia used during the test).  NUTRITION  Drink plenty of fluids.   You may resume your normal diet as instructed by your doctor.   Begin with a light meal and progress to your normal diet. Heavy or fried foods are harder to digest and may make you feel sick to your stomach (nauseated).   Avoid alcoholic beverages for 24 hours or as instructed.  MEDICATIONS  You may resume your normal medications unless your doctor tells you otherwise.  WHAT YOU CAN EXPECT TODAY  Some feelings of bloating in the abdomen.   Passage of more gas than usual.   Spotting of blood in your stool or on the toilet paper.  IF YOU HAD POLYPS REMOVED DURING THE COLONOSCOPY:  No aspirin products for 7 days or as instructed.   No alcohol for 7 days or as instructed.   Eat a soft diet for the next 24 hours.  FINDING OUT THE RESULTS OF YOUR TEST Not all test results are available during your visit. If your test results are not back during the visit, make an appointment  with your caregiver to find out the results. Do not assume everything is normal if you have not heard from your caregiver or the medical facility. It is important for you to follow up on all of your test results.  SEEK IMMEDIATE MEDICAL ATTENTION IF:  You have more than a spotting of blood in your stool.   Your belly is swollen (abdominal distention).   You are nauseated or vomiting.   You have a temperature over 101.  You have abdominal pain or discomfort that is severe or gets worse throughout the day. EGD Discharge instructions Please read the instructions outlined below and refer to this sheet in the next few weeks. These discharge instructions provide you with general information on caring for yourself after you leave the hospital. Your doctor may also give you specific instructions. While your treatment has been planned according to the most current medical practices available, unavoidable complications occasionally occur. If you have any problems or questions after discharge, please call your doctor. ACTIVITY You may resume your regular activity but move at a slower pace for the next 24 hours.  Take frequent rest periods for the next 24 hours.  Walking will help expel (get rid of) the air and reduce the bloated feeling in your abdomen.  No driving for 24 hours (because of the anesthesia (medicine) used during the test).  You may shower.  Do not sign any important legal documents or operate any machinery for 24  hours (because of the anesthesia used during the test).  NUTRITION Drink plenty of fluids.  You may resume your normal diet.  Begin with a light meal and progress to your normal diet.  Avoid alcoholic beverages for 24 hours or as instructed by your caregiver.  MEDICATIONS You may resume your normal medications unless your caregiver tells you otherwise.  WHAT YOU CAN EXPECT TODAY You may experience abdominal discomfort such as a feeling of fullness or gas pains.   FOLLOW-UP Your doctor will discuss the results of your test with you.  SEEK IMMEDIATE MEDICAL ATTENTION IF ANY OF THE FOLLOWING OCCUR: Excessive nausea (feeling sick to your stomach) and/or vomiting.  Severe abdominal pain and distention (swelling).  Trouble swallowing.  Temperature over 101 F (37.8 C).  Rectal bleeding or vomiting of blood.     Diverticulosis information provided  Further recommendations to follow pending review of pathology report. Diverticulosis Diverticulosis is the condition that develops when small pouches (diverticula) form in the wall of your colon. Your colon, or large intestine, is where water is absorbed and stool is formed. The pouches form when the inside layer of your colon pushes through weak spots in the outer layers of your colon. CAUSES  No one knows exactly what causes diverticulosis. RISK FACTORS Being older than 2. Your risk for this condition increases with age. Diverticulosis is rare in people younger than 40 years. By age 25, almost everyone has it. Eating a low-fiber diet. Being frequently constipated. Being overweight. Not getting enough exercise. Smoking. Taking over-the-counter pain medicines, like aspirin and ibuprofen. SYMPTOMS  Most people with diverticulosis do not have symptoms. DIAGNOSIS  Because diverticulosis often has no symptoms, health care providers often discover the condition during an exam for other colon problems. In many cases, a health care provider will diagnose diverticulosis while using a flexible scope to examine the colon (colonoscopy). TREATMENT  If you have never developed an infection related to diverticulosis, you may not need treatment. If you have had an infection before, treatment may include: Eating more fruits, vegetables, and grains. Taking a fiber supplement. Taking a live bacteria supplement (probiotic). Taking medicine to relax your colon. HOME CARE INSTRUCTIONS  Drink at least 6-8 glasses of  water each day to prevent constipation. Try not to strain when you have a bowel movement. Keep all follow-up appointments. If you have had an infection before: Increase the fiber in your diet as directed by your health care provider or dietitian. Take a dietary fiber supplement if your health care provider approves. Only take medicines as directed by your health care provider. SEEK MEDICAL CARE IF:  You have abdominal pain. You have bloating. You have cramps. You have not gone to the bathroom in 3 days. SEEK IMMEDIATE MEDICAL CARE IF:  Your pain gets worse. Yourbloating becomes very bad. You have a fever or chills, and your symptoms suddenly get worse. You begin vomiting. You have bowel movements that are bloody or black. MAKE SURE YOU: Understand these instructions. Will watch your condition. Will get help right away if you are not doing well or get worse. Document Released: 11/11/2003 Document Revised: 02/18/2013 Document Reviewed: 01/08/2013 [facility] Patient Information 2015 [place], [place]. This information is not intended to replace advice given to you by your health care provider. Make sure you discuss any questions you have with your health care provider.

## 2013-11-21 ENCOUNTER — Encounter (HOSPITAL_COMMUNITY): Payer: Self-pay | Admitting: Internal Medicine

## 2013-11-25 ENCOUNTER — Ambulatory Visit (INDEPENDENT_AMBULATORY_CARE_PROVIDER_SITE_OTHER): Payer: Medicare Other

## 2013-11-25 DIAGNOSIS — M79609 Pain in unspecified limb: Secondary | ICD-10-CM

## 2013-11-25 DIAGNOSIS — E1140 Type 2 diabetes mellitus with diabetic neuropathy, unspecified: Secondary | ICD-10-CM

## 2013-11-25 DIAGNOSIS — E1149 Type 2 diabetes mellitus with other diabetic neurological complication: Secondary | ICD-10-CM

## 2013-11-25 DIAGNOSIS — E1142 Type 2 diabetes mellitus with diabetic polyneuropathy: Secondary | ICD-10-CM

## 2013-11-25 DIAGNOSIS — B351 Tinea unguium: Secondary | ICD-10-CM

## 2013-11-25 NOTE — Progress Notes (Signed)
   Subjective:    Patient ID: [name], female    DOB: 1934-01-16, 80 y.o.   MRN: [id number]  HPI Comments: Pt states she really needs her toenails cut and the only change is she had cataract surgery.     Review of Systems no new findings or systemic changes patient had recent colonoscopy as well as cataract surgery otherwise unremarkable health changes has been slightly anemic.     Objective:   Physical Exam Neurovascular status is intact pedal pulses DP +2/4 PT one over 4 bilateral capillary refill time 3 seconds all digits epicritic sensations decreased on Semmes Weinstein to forefoot digits and plantar arch and open wounds or ulcers no secondary infections nails 1 through 5 bilateral thick criptotic incurvated tender both on palpation and shoe wear.       Assessment & Plan:  Assessment this time diabetes with history peripheral neuropathy mild onychomycosis with dystrophy discoloration and friability of nails 1 through 4 bilateral debridement at this time return for future palliative care and as-needed basis painful mycotic nails debrided and presence of diabetes and complications reappointed 3 months  Harriet Masson DPM

## 2013-11-25 NOTE — Patient Instructions (Signed)
Diabetes and Foot Care Diabetes may cause you to have problems because of poor blood supply (circulation) to your feet and legs. This may cause the skin on your feet to become thinner, break easier, and heal more slowly. Your skin may become dry, and the skin may peel and crack. You may also have nerve damage in your legs and feet causing decreased feeling in them. You may not notice minor injuries to your feet that could lead to infections or more serious problems. Taking care of your feet is one of the most important things you can do for yourself.  HOME CARE INSTRUCTIONS  Wear shoes at all times, even in the house. Do not go barefoot. Bare feet are easily injured.  Check your feet daily for blisters, cuts, and redness. If you cannot see the bottom of your feet, use a mirror or ask someone for help.  Wash your feet with warm water (do not use hot water) and mild soap. Then pat your feet and the areas between your toes until they are completely dry. Do not soak your feet as this can dry your skin.  Apply a moisturizing lotion or petroleum jelly (that does not contain alcohol and is unscented) to the skin on your feet and to dry, brittle toenails. Do not apply lotion between your toes.  Trim your toenails straight across. Do not dig under them or around the cuticle. File the edges of your nails with an emery board or nail file.  Do not cut corns or calluses or try to remove them with medicine.  Wear clean socks or stockings every day. Make sure they are not too tight. Do not wear knee-high stockings since they may decrease blood flow to your legs.  Wear shoes that fit properly and have enough cushioning. To break in new shoes, wear them for just a few hours a day. This prevents you from injuring your feet. Always look in your shoes before you put them on to be sure there are no objects inside.  Do not cross your legs. This may decrease the blood flow to your feet.  If you find a minor scrape,  cut, or break in the skin on your feet, keep it and the skin around it clean and dry. These areas may be cleansed with mild soap and water. Do not cleanse the area with peroxide, alcohol, or iodine.  When you remove an adhesive bandage, be sure not to damage the skin around it.  If you have a wound, look at it several times a day to make sure it is healing.  Do not use heating pads or hot water bottles. They may burn your skin. If you have lost feeling in your feet or legs, you may not know it is happening until it is too late.  Make sure your health care provider performs a complete foot exam at least annually or more often if you have foot problems. Report any cuts, sores, or bruises to your health care provider immediately. SEEK MEDICAL CARE IF:   You have an injury that is not healing.  You have cuts or breaks in the skin.  You have an ingrown nail.  You notice redness on your legs or feet.  You feel burning or tingling in your legs or feet.  You have pain or cramps in your legs and feet.  Your legs or feet are numb.  Your feet always feel cold. SEEK IMMEDIATE MEDICAL CARE IF:   There is increasing redness,   swelling, or pain in or around a wound.  There is a red line that goes up your leg.  Pus is coming from a wound.  You develop a fever or as directed by your health care provider.  You notice a bad smell coming from an ulcer or wound. Document Released: 02/11/2000 Document Revised: 10/16/2012 Document Reviewed: 07/23/2012 [employer] Patient Information 2015 [employer]. This information is not intended to replace advice given to you by your health care provider. Make sure you discuss any questions you have with your health care provider.  

## 2013-11-29 ENCOUNTER — Encounter: Payer: Self-pay | Admitting: Internal Medicine

## 2013-12-12 ENCOUNTER — Other Ambulatory Visit (HOSPITAL_COMMUNITY): Payer: Self-pay | Admitting: Pulmonary Disease

## 2013-12-12 DIAGNOSIS — C50912 Malignant neoplasm of unspecified site of left female breast: Secondary | ICD-10-CM

## 2013-12-23 ENCOUNTER — Ambulatory Visit (HOSPITAL_COMMUNITY)
Admission: RE | Admit: 2013-12-23 | Discharge: 2013-12-23 | Disposition: A | Discharge status: Home / Self Care | Payer: Medicare Other | Source: Ambulatory Visit | Attending: Pulmonary Disease | Admitting: Pulmonary Disease

## 2013-12-23 ENCOUNTER — Other Ambulatory Visit (HOSPITAL_COMMUNITY): Payer: Self-pay | Admitting: Pulmonary Disease

## 2013-12-23 DIAGNOSIS — M79671 Pain in right foot: Secondary | ICD-10-CM

## 2013-12-25 ENCOUNTER — Encounter: Payer: Self-pay | Admitting: Podiatry

## 2013-12-25 ENCOUNTER — Ambulatory Visit (INDEPENDENT_AMBULATORY_CARE_PROVIDER_SITE_OTHER): Payer: Medicare Other

## 2013-12-25 ENCOUNTER — Ambulatory Visit (INDEPENDENT_AMBULATORY_CARE_PROVIDER_SITE_OTHER): Payer: Medicare Other | Admitting: Podiatry

## 2013-12-25 DIAGNOSIS — M109 Gout, unspecified: Secondary | ICD-10-CM

## 2013-12-25 DIAGNOSIS — M84374A Stress fracture, right foot, initial encounter for fracture: Secondary | ICD-10-CM

## 2013-12-25 NOTE — Progress Notes (Signed)
She presents today with a red swollen dorsal aspect of the second metatarsal metadiaphyseal region. She denies any trauma to the foot. She has been seen by her primary doctor who suggested that she has a stress fracture.  Objective: Vital signs are stable she is alert and oriented 3 she has pain on direct palpation of the second mid diaphyseal region of the right foot. Radiographic evaluation does demonstrate what appears to be a cortical interruption no bony callus as of yet second metatarsal right foot.  Assessment: We are assuming a second metatarsal stress fracture right foot at this point.  Plan: Placed her in a Cam Walker.

## 2013-12-26 ENCOUNTER — Telehealth: Payer: Self-pay | Admitting: *Deleted

## 2013-12-26 ENCOUNTER — Ambulatory Visit (INDEPENDENT_AMBULATORY_CARE_PROVIDER_SITE_OTHER): Payer: Medicare Other | Admitting: Internal Medicine

## 2013-12-26 VITALS — BP 138/71 | HR 74 | Temp 96.9°F | Ht 61.0 in

## 2013-12-26 DIAGNOSIS — K7460 Unspecified cirrhosis of liver: Secondary | ICD-10-CM

## 2013-12-26 NOTE — Patient Instructions (Signed)
Stool for occult blood testing  Hepatitis B surface antigen and hepatitis B surface antibody  Hepatitis C antibody  Hepatitis A antibody  Serum TTG, IGA and serum total IGA antibody  Screening hepatic ultrasound every 6 months  Office visit in 3 months

## 2013-12-26 NOTE — Progress Notes (Signed)
Primary Care Physician:  Alonza Bogus, MD Primary Gastroenterologist:  Dr. Gala Romney  Pre-Procedure History & Physical: HPI:  [name] is a 80 y.o. female here for followup of iron deficiency anemia. She's received parenteral iron in [place]. No Hemoccults done. Clinically, no bleeding. Nodular liver contour on ultrasound and CT. Medicare score 4. No prior history of liver disease paragraph family history of liver disease. EGD and colonoscopy without significant findings. No esophageal varices. No history of alcohol ingestion. No family history of liver disease.  Past Medical History  Diagnosis Date  . Diabetes mellitus   . Arthritis   . Hyperlipidemia   . Mental disorder   . Chronic kidney disease     renal stones, one episode of Lithotripsy  . GERD (gastroesophageal reflux disease)   . Skin cancer     face- melanoma, 2012- surg. excision    . Breast cancer, left     er/pr +  . Hypertension     followed by [facility]. relative to  urology study grp.  Doesn't report ever having a stress test   . Complication of anesthesia   . PONV (postoperative nausea and vomiting)   . Cataract     no surgery as yet,starting of 3 years  . Seasonal allergies   . Cirrhosis     Metavir score 4    Past Surgical History  Procedure Laterality Date  . Cholecystectomy    . Abdominal hysterectomy    . Skin biopsy    . Shoulder surgery    . Tubal ligation    . Back surgery      x4 back surgery to include rods & screws  . Last in May 2014  . Fracture surgery      1975- ORIF- L ankle   . Breast lumpectomy with needle localization and axillary sentinel lymph node bx  03/12/2012    Procedure: BREAST LUMPECTOMY WITH NEEDLE LOCALIZATION AND AXILLARY SENTINEL LYMPH NODE BX;  Surgeon: Merrie Roof, MD;  Location: [facility];  Service: General;  Laterality: Left;  . Re-excision of breast lumpectomy  03/22/2012    Procedure: RE-EXCISION OF BREAST LUMPECTOMY;  Surgeon: Merrie Roof, MD;  Location:  [facility];  Service: General;  Laterality: Left;  re-excision left breast lumpectomy cavity  ER+, PR+  . Rotator cuff repair Bilateral   . Breast surgery      lumpectomy x2  . Colonoscopy N/A 11/20/2013    Dr.Cori Justus- diverticula was found in the left colon. o/w normal rectal, colonic and terminal ileal mucosa  . Esophagogastroduodenoscopy N/A 11/20/2013    Dr.Ariana Cavenaugh- abnormal mucosa was found. diffuse snake skinning and friability of the gastric mucosa. patent pylorus. abnormal-appearing first, second and third portion of the duodenum. 63mm gastric nodule at the GE junction. stomach bx= minimal chronic infammation, GE junction bx= polypoid fragments of gastric cardia type mucosa w/mod chronic inflammation and foveolar hyperplasia  . Eye surgery      Prior to Admission medications   Medication Sig Start Date End Date Taking? Authorizing Provider  amLODipine (NORVASC) 10 MG tablet Take 10 mg by mouth daily.   Yes Historical Provider, MD  anastrozole (ARIMIDEX) 1 MG tablet Take 1 mg by mouth daily.   Yes Historical Provider, MD  aspirin 81 MG tablet Take 81 mg by mouth daily.   Yes Historical Provider, MD  atorvastatin (LIPITOR) 10 MG tablet Take 10 mg by mouth daily.   Yes Historical Provider, MD  benazepril (  LOTENSIN) 20 MG tablet Take 20 mg by mouth daily.   Yes Historical Provider, MD  Calcium Carbonate-Vitamin D (CALCIUM + D PO) Take 1,200 mg by mouth daily.   Yes Historical Provider, MD  DULoxetine (CYMBALTA) 60 MG capsule Take 60 mg by mouth daily.   Yes Historical Provider, MD  gabapentin (NEURONTIN) 300 MG capsule Take 300 mg by mouth 3 (three) times daily.   Yes Historical Provider, MD  glyBURIDE-metformin (GLUCOVANCE) 2.5-500 MG per tablet Take 1 tablet by mouth daily with breakfast.   Yes Historical Provider, MD  hydrALAZINE (APRESOLINE) 25 MG tablet Take 25 mg by mouth 3 (three) times daily.   Yes Historical Provider, MD  insulin glargine (LANTUS) 100 UNIT/ML injection  Inject 50 Units into the skin daily.   Yes Historical Provider, MD  metoprolol (LOPRESSOR) 100 MG tablet Take 100 mg by mouth daily.   Yes Historical Provider, MD  omeprazole (PRILOSEC) 20 MG capsule Take 20 mg by mouth daily.   Yes Historical Provider, MD  sitaGLIPtin (JANUVIA) 100 MG tablet Take 100 mg by mouth daily.   Yes Historical Provider, MD    Allergies as of 12/26/2013 - Review Complete 12/26/2013  Allergen Reaction Noted  . Demerol Nausea Only 11/11/2010  . Other Other (See Comments) 05/04/2011    Family History  Problem Relation Age of Onset  . Heart disease Father   . Colon cancer Neg Hx     History   Social History  . Marital Status: Married    Spouse Name: N/A    Number of Children: N/A  . Years of Education: N/A   Occupational History  . retired     Orthoptist for 30 years   Social History Main Topics  . Smoking status: Never Smoker   . Smokeless tobacco: Never Used  . Alcohol Use: No  . Drug Use: No  . Sexual Activity: Yes     Comment: GX p2,  1st live birth 50   Other Topics Concern  . Not on file   Social History Narrative  . No narrative on file    Review of Systems: See HPI, otherwise negative ROS  Physical Exam: BP 138/71  Pulse 74  Temp(Src) 96.9 F (36.1 C) (Oral)  Ht 5\' 1"  (1.549 m) General:   Alert,  Well-developed, well-nourished, pleasant and cooperative in NAD Skin:  Intact without significant lesions or rashes. Eyes:  Sclera clear, no icterus.   Conjunctiva pink. Ears:  Normal auditory acuity. Nose:  No deformity, discharge,  or lesions. Mouth:  No deformity or lesions. Neck:  Supple; no masses or thyromegaly. No significant cervical adenopathy. Lungs:  Clear throughout to auscultation.   No wheezes, crackles, or rhonchi. No acute distress. Heart:  Regular rate and rhythm; no murmurs, clicks, rubs,  or gallops. Abdomen: Non-distended, normal bowel sounds.  Soft and nontender without appreciable mass or  hepatosplenomegaly.  Pulses:  Normal pulses noted. Extremities:  Without clubbing or edema.  Impression:  80 year old lady with marked iron deficiency anemia. She's been anemic most of her life. Small bowel appeared normal on recent EGD. No Hemoccults. She's been receiving parenteral iron. Likely a malabsorptive issue. Small bowel has not been studied as of yet. Hemoccult status unknown. Nodular liver: 2 imaging studies as well as a minute or score of 4 makes cirrhosis and likely diagnosis.  Likely Karlene Lineman. We need to screen for chronic viral hepatitis. Immune status needs to be determined.  Recommendations:  Stool for occult blood testing  Hepatitis  B surface antigen and hepatitis B surface antibody  Hepatitis C antibody  Hepatitis A antibody  Serum TTG, IGA and serum total IGA antibody  Screening hepatic ultrasound every 6 months  Office visit in 3 months   Notice: This dictation was prepared with Dragon dictation along with smaller phrase technology. Any transcriptional errors that result from this process are unintentional and may not be corrected upon review.

## 2013-12-26 NOTE — Telephone Encounter (Signed)
I need to speak to [facility]  Have her call me immediately.  If she's not in can I speak to Dr. Erasmo Downer nurse?  Thank you.  I called her back to make sure she was taken care of.  "They called me back, I came in yesterday and they put a boot on me.  Thank you for calling."

## 2014-01-01 LAB — IGA: IGA: 277 mg/dL (ref 69–380)

## 2014-01-01 LAB — HEPATITIS B SURFACE ANTIBODY,QUALITATIVE: Hep B S Ab: NEGATIVE

## 2014-01-01 LAB — PROTIME-INR
INR: 1.05 (ref <1.50)
Prothrombin Time: 13.7 seconds (ref 11.6–15.2)

## 2014-01-01 LAB — HEPATIC FUNCTION PANEL
ALT: 12 U/L (ref 0–35)
AST: 19 U/L (ref 0–37)
Albumin: 3.9 g/dL (ref 3.5–5.2)
Alkaline Phosphatase: 101 U/L (ref 39–117)
BILIRUBIN DIRECT: 0.1 mg/dL (ref 0.0–0.3)
Indirect Bilirubin: 0.4 mg/dL (ref 0.2–1.2)
Total Bilirubin: 0.5 mg/dL (ref 0.2–1.2)
Total Protein: 6.8 g/dL (ref 6.0–8.3)

## 2014-01-01 LAB — HEPATITIS B SURFACE ANTIGEN: Hepatitis B Surface Ag: NEGATIVE

## 2014-01-02 LAB — TISSUE TRANSGLUTAMINASE, IGA: Tissue Transglutaminase Ab, IgA: 6.7 U/mL (ref <20)

## 2014-01-06 ENCOUNTER — Ambulatory Visit (INDEPENDENT_AMBULATORY_CARE_PROVIDER_SITE_OTHER): Payer: Medicare Other | Admitting: *Deleted

## 2014-01-06 DIAGNOSIS — Z1211 Encounter for screening for malignant neoplasm of colon: Secondary | ICD-10-CM

## 2014-01-06 LAB — IFOBT (OCCULT BLOOD): IMMUNOLOGICAL FECAL OCCULT BLOOD TEST: NEGATIVE

## 2014-01-06 NOTE — Progress Notes (Signed)
Patient ID: [name], female   DOB: 09-02-1933, 80 y.o.   MRN: [id number] IFOBT Test: (-) Negative

## 2014-01-08 ENCOUNTER — Other Ambulatory Visit: Payer: Self-pay

## 2014-01-08 DIAGNOSIS — K7460 Unspecified cirrhosis of liver: Secondary | ICD-10-CM

## 2014-01-08 NOTE — Progress Notes (Signed)
Quick Note:  I called lab and these orders were not on original order. I have faxed the order for the Hep C antibody and the Hep A antibody to [facility].  Pt is aware and will go to the lab tomorrow. ______

## 2014-01-08 NOTE — Progress Notes (Signed)
Quick Note:  Correction Hep C not Hep B ______

## 2014-01-08 NOTE — Progress Notes (Signed)
Quick Note:  Forwarding to Doris to follow-up on Hep A & Hep B lab results ______

## 2014-01-13 LAB — HEPATITIS C ANTIBODY: HCV Ab: NEGATIVE

## 2014-01-13 LAB — HEPATITIS A ANTIBODY, TOTAL: Hep A Total Ab: NONREACTIVE

## 2014-01-14 ENCOUNTER — Other Ambulatory Visit: Payer: Self-pay

## 2014-01-19 ENCOUNTER — Other Ambulatory Visit: Payer: Self-pay

## 2014-01-19 DIAGNOSIS — D509 Iron deficiency anemia, unspecified: Secondary | ICD-10-CM

## 2014-01-19 NOTE — Progress Notes (Signed)
Called [employer] and spoke to representative.  No pre-cert needed for Givens Read.

## 2014-01-19 NOTE — Progress Notes (Signed)
Pt is scheduled for 02/24/2014

## 2014-01-26 ENCOUNTER — Encounter (HOSPITAL_COMMUNITY): Payer: Self-pay

## 2014-01-27 ENCOUNTER — Encounter (HOSPITAL_COMMUNITY): Payer: Self-pay

## 2014-01-29 ENCOUNTER — Ambulatory Visit (INDEPENDENT_AMBULATORY_CARE_PROVIDER_SITE_OTHER): Payer: Medicare Other | Admitting: Podiatry

## 2014-01-29 ENCOUNTER — Ambulatory Visit (INDEPENDENT_AMBULATORY_CARE_PROVIDER_SITE_OTHER): Payer: Medicare Other

## 2014-01-29 DIAGNOSIS — M8430XD Stress fracture, unspecified site, subsequent encounter for fracture with routine healing: Secondary | ICD-10-CM

## 2014-01-29 DIAGNOSIS — M109 Gout, unspecified: Secondary | ICD-10-CM

## 2014-01-29 NOTE — Progress Notes (Signed)
She presents today with chief complaint of a painful fourth digit right foot. She states that the stress fracture to the base of the second metatarsal seems to be doing much better. He continues to wear a Cam Walker.  Objective: Vital signs are stable she is alert and oriented 3. She has moderate edema across the dorsal aspect of the foot with pain on palpation base of the second and third metatarsals. Radiographs confirm stress fractures to the base of the second and third metatarsals. She does have a red hot swollen DIPJ fourth digit right foot. Radiographs demonstrate osteoarthritis and I do not see any signs of osteomyelitis.  Assessment: Well healing fracture second and third metatarsal bases right foot. Gouty capsulitis fourth digit right foot.  Plan: I injected the area today with 2 mg of dexamethasone and local anesthetic alleviating her symptoms immediately. Dr. Blenda Mounts will follow up with her in a few weeks.

## 2014-02-03 ENCOUNTER — Ambulatory Visit (HOSPITAL_COMMUNITY)
Admission: RE | Admit: 2014-02-03 | Discharge: 2014-02-03 | Disposition: A | Discharge status: Home / Self Care | Payer: Medicare Other | Source: Ambulatory Visit | Attending: Pulmonary Disease | Admitting: Pulmonary Disease

## 2014-02-03 DIAGNOSIS — C50912 Malignant neoplasm of unspecified site of left female breast: Secondary | ICD-10-CM

## 2014-02-03 DIAGNOSIS — Z853 Personal history of malignant neoplasm of breast: Secondary | ICD-10-CM | POA: Insufficient documentation

## 2014-02-06 ENCOUNTER — Telehealth: Payer: Self-pay

## 2014-02-06 NOTE — Telephone Encounter (Signed)
Pt called wanting information regarding her Givens capsule test. Mailed new letter with instructions

## 2014-02-24 ENCOUNTER — Other Ambulatory Visit: Payer: Medicare Other

## 2014-02-24 ENCOUNTER — Ambulatory Visit (HOSPITAL_COMMUNITY)
Admission: RE | Admit: 2014-02-24 | Discharge: 2014-02-24 | Disposition: A | Discharge status: Home / Self Care | Payer: Medicare Other | Source: Ambulatory Visit | Attending: Internal Medicine | Admitting: Internal Medicine

## 2014-02-24 ENCOUNTER — Encounter (HOSPITAL_COMMUNITY)
Admission: RE | Disposition: A | Discharge status: Home / Self Care | Payer: Self-pay | Source: Ambulatory Visit | Attending: Internal Medicine

## 2014-02-24 ENCOUNTER — Encounter (HOSPITAL_COMMUNITY): Payer: Self-pay | Admitting: *Deleted

## 2014-02-24 DIAGNOSIS — K5730 Diverticulosis of large intestine without perforation or abscess without bleeding: Secondary | ICD-10-CM | POA: Insufficient documentation

## 2014-02-24 DIAGNOSIS — Q2733 Arteriovenous malformation of digestive system vessel: Secondary | ICD-10-CM | POA: Diagnosis not present

## 2014-02-24 DIAGNOSIS — D509 Iron deficiency anemia, unspecified: Secondary | ICD-10-CM | POA: Insufficient documentation

## 2014-02-24 HISTORY — PX: GIVENS CAPSULE STUDY: SHX5432

## 2014-02-24 SURGERY — IMAGING PROCEDURE, GI TRACT, INTRALUMINAL, VIA CAPSULE

## 2014-02-25 ENCOUNTER — Encounter: Payer: Self-pay | Admitting: Internal Medicine

## 2014-02-25 ENCOUNTER — Encounter (HOSPITAL_COMMUNITY): Payer: Self-pay | Admitting: Internal Medicine

## 2014-02-26 ENCOUNTER — Telehealth: Payer: Self-pay | Admitting: Gastroenterology

## 2014-02-26 NOTE — Telephone Encounter (Signed)
80 year old female with IDA, with question of erosions/ulcerations and scattered AVMs as likely contributor on capsule study. No NSAIDs or aspirin powders listed on home medication list; would avoid these indefinitely. No areas of overt bleeding or concerning mass-like areas; however, there is a polyp-like protrusion that may be a normal variant as listed above. Doubt this is of clinical significance to current IDA presentation but will review with Dr. Gala Romney. Continue follow-up with Dr. Tressie Stalker.

## 2014-02-26 NOTE — Op Note (Addendum)
Small Bowel [place] Capsule Study Procedure date:  02/24/14  Referring Provider:  Dr. Gala Romney  PCP:  Dr. Alonza Bogus, MD  Indication for procedure:   80 year old female with marked IDA, chronic anemia, likely NASH cirrhosis. Normal celiac serologies. Recent EGD and colonoscopy on file:   Colonoscopy N/A 11/20/2013    Dr.Colby Reels- diverticula was found in the left colon. o/w normal rectal, colonic and terminal ileal mucosa  . Esophagogastroduodenoscopy N/A 11/20/2013    Dr.Stephonie Wilcoxen- abnormal mucosa was found. diffuse snake skinning and friability of the gastric mucosa. patent pylorus. abnormal-appearing first, second and third portion of the duodenum. 75mm gastric nodule at the GE junction. stomach bx= minimal chronic infammation, GE junction bx= polypoid fragments of gastric cardia type mucosa w/mod chronic inflammation and foveolar hyperplasia   Now with need for evaluation of small bowel for contributor to IDA.    Findings:   Capsule study complete to cecum. Scattered AVMs starting around 00:52:28 with question of erosion/ulcerations in several areas mid to distal small bowel. Query polyp-like protrusion vs normal variant of small bowel at 04:07:00. No obvious mass, bleeding lesions.   First Gastric image:  00:02:29 First Duodenal image: 00:50:07 First Cecal image: 04:12:11 Gastric Passage time: 0h 43m Small Bowel Passage time:  3h 66m  Summary & Recommendations: 80 year old female with IDA, with question of erosions/ulcerations and scattered AVMs as likely contributor. No NSAIDs or aspirin powders listed on home medication list; would avoid these indefinitely. No areas of overt bleeding or concerning mass-like areas; however, there is a polyp-like protrusion that may be a normal variant as listed above. Doubt this is of clinical significance to current IDA presentation but will review with Dr. Gala Romney. Continue follow-up with Dr. Tressie Stalker.    Orvil Feil, ANP-BC [facility]  Gastroenterology    Addendum on 1/19:  Reviewed with Dr. Gala Romney. The area that appears to be a polyp islikely benign polypoid mucosa and a small bowel fold photographed during peristalsis, giving the above impression. Benign variant. Continue with current plan.  Orvil Feil, ANP-BC [facility] Gastroenterology

## 2014-03-03 ENCOUNTER — Other Ambulatory Visit: Payer: Medicare Other

## 2014-03-03 ENCOUNTER — Ambulatory Visit (INDEPENDENT_AMBULATORY_CARE_PROVIDER_SITE_OTHER): Payer: Medicare Other

## 2014-03-03 DIAGNOSIS — B351 Tinea unguium: Secondary | ICD-10-CM

## 2014-03-03 DIAGNOSIS — M79609 Pain in unspecified limb: Secondary | ICD-10-CM

## 2014-03-03 NOTE — Telephone Encounter (Signed)
Tried to call pt- NA 

## 2014-03-03 NOTE — Patient Instructions (Signed)
Diabetes and Foot Care Diabetes may cause you to have problems because of poor blood supply (circulation) to your feet and legs. This may cause the skin on your feet to become thinner, break easier, and heal more slowly. Your skin may become dry, and the skin may peel and crack. You may also have nerve damage in your legs and feet causing decreased feeling in them. You may not notice minor injuries to your feet that could lead to infections or more serious problems. Taking care of your feet is one of the most important things you can do for yourself.  HOME CARE INSTRUCTIONS  Wear shoes at all times, even in the house. Do not go barefoot. Bare feet are easily injured.  Check your feet daily for blisters, cuts, and redness. If you cannot see the bottom of your feet, use a mirror or ask someone for help.  Wash your feet with warm water (do not use hot water) and mild soap. Then pat your feet and the areas between your toes until they are completely dry. Do not soak your feet as this can dry your skin.  Apply a moisturizing lotion or petroleum jelly (that does not contain alcohol and is unscented) to the skin on your feet and to dry, brittle toenails. Do not apply lotion between your toes.  Trim your toenails straight across. Do not dig under them or around the cuticle. File the edges of your nails with an emery board or nail file.  Do not cut corns or calluses or try to remove them with medicine.  Wear clean socks or stockings every day. Make sure they are not too tight. Do not wear knee-high stockings since they may decrease blood flow to your legs.  Wear shoes that fit properly and have enough cushioning. To break in new shoes, wear them for just a few hours a day. This prevents you from injuring your feet. Always look in your shoes before you put them on to be sure there are no objects inside.  Do not cross your legs. This may decrease the blood flow to your feet.  If you find a minor scrape,  cut, or break in the skin on your feet, keep it and the skin around it clean and dry. These areas may be cleansed with mild soap and water. Do not cleanse the area with peroxide, alcohol, or iodine.  When you remove an adhesive bandage, be sure not to damage the skin around it.  If you have a wound, look at it several times a day to make sure it is healing.  Do not use heating pads or hot water bottles. They may burn your skin. If you have lost feeling in your feet or legs, you may not know it is happening until it is too late.  Make sure your health care provider performs a complete foot exam at least annually or more often if you have foot problems. Report any cuts, sores, or bruises to your health care provider immediately. SEEK MEDICAL CARE IF:   You have an injury that is not healing.  You have cuts or breaks in the skin.  You have an ingrown nail.  You notice redness on your legs or feet.  You feel burning or tingling in your legs or feet.  You have pain or cramps in your legs and feet.  Your legs or feet are numb.  Your feet always feel cold. SEEK IMMEDIATE MEDICAL CARE IF:   There is increasing redness,   swelling, or pain in or around a wound.  There is a red line that goes up your leg.  Pus is coming from a wound.  You develop a fever or as directed by your health care provider.  You notice a bad smell coming from an ulcer or wound. Document Released: 02/11/2000 Document Revised: 10/16/2012 Document Reviewed: 07/23/2012 [employer] Patient Information 2015 [employer]. This information is not intended to replace advice given to you by your health care provider. Make sure you discuss any questions you have with your health care provider.  

## 2014-03-03 NOTE — Progress Notes (Signed)
   Subjective:    Patient ID: [name], female    DOB: Jul 06, 1933, 81 y.o.   MRN: [id number]  HPI Comments: "I need my nails cut. And I was here about a month ago and Dr. Milinda Pointer said I had gout in the 4th toe (right)"       Review of Systems no new findings or systemic changes noted     Objective:   Physical Exam Neurovascular status unchanged pedal pulses are palpable DP +2 PT 1 over 4 bilateral + edema the fourth toe right foot no pain reproduced on palpation patient has a possible stress fracture the second and resolving gouty arthropathy of the fourth digit right foot. No discharge drainage no signs of ulcer nails thick brittle [place] friable dystrophic 1 through 5 bilateral. Orthopedic biomechanical exam otherwise unremarkable patient does have ABDs with neuropathy decreased epicritic sensation confirmed on Semmes Weinstein to the forefoot digits and arch.       Assessment & Plan:  Assessment #1 diabetes with history peripheral neuropathy debridement of painful mycotic brittle dystrophic nails 1 through 5 bilateral carried out at this time. Exam of the fourth toe right reveals resolving are healing gouty arthropathy no active wound ulcer or discharge noted monitor in the future as needed maintain accommodative shoes at all times  Harriet Masson DPM

## 2014-03-06 NOTE — Telephone Encounter (Signed)
Tried to call pt- phone number was busy.  

## 2014-03-06 NOTE — Telephone Encounter (Signed)
Pt is aware.  

## 2014-03-17 NOTE — Telephone Encounter (Signed)
Reviewed with Dr. Gala Romney. The area I saw was likely benign polypoid mucosa and a small bowel fold photographed at just the right time during peristalsis to give the impression of a mass, but it is not anything of concern. Basically, a benign variant.

## 2014-03-18 NOTE — Telephone Encounter (Signed)
Tried to call pt- LMOM 

## 2014-03-19 NOTE — Telephone Encounter (Signed)
Pt is aware.  

## 2014-05-21 ENCOUNTER — Telehealth: Payer: Self-pay | Admitting: Internal Medicine

## 2014-05-21 NOTE — Telephone Encounter (Signed)
Mailed letter °

## 2014-05-21 NOTE — Telephone Encounter (Signed)
ON April RECALL FOR ABD ULTRASOUND

## 2014-06-01 ENCOUNTER — Other Ambulatory Visit: Payer: Self-pay

## 2014-06-01 ENCOUNTER — Telehealth: Payer: Self-pay

## 2014-06-01 NOTE — Telephone Encounter (Signed)
Pt called to arrange her follow up US. Can we clarify which [place] pt needs. Does she need the [place] abd complete or [place] abd complete with elastography.  Please advise

## 2014-06-04 ENCOUNTER — Other Ambulatory Visit: Payer: Self-pay

## 2014-06-04 DIAGNOSIS — Z09 Encounter for follow-up examination after completed treatment for conditions other than malignant neoplasm: Secondary | ICD-10-CM

## 2014-06-04 NOTE — Telephone Encounter (Signed)
US abdomen, no elastography.

## 2014-06-04 NOTE — Telephone Encounter (Signed)
No pre cert required for [place].   Called pt and LMOM.  [place] is scheduled for 06/09/2014 @ 9:00am. Told pt to be fasting on message

## 2014-06-09 ENCOUNTER — Ambulatory Visit (HOSPITAL_COMMUNITY)
Admission: RE | Admit: 2014-06-09 | Discharge: 2014-06-09 | Disposition: A | Discharge status: Home / Self Care | Payer: Medicare Other | Source: Ambulatory Visit | Attending: Gastroenterology | Admitting: Gastroenterology

## 2014-06-09 DIAGNOSIS — K7460 Unspecified cirrhosis of liver: Secondary | ICD-10-CM | POA: Insufficient documentation

## 2014-06-09 DIAGNOSIS — Z09 Encounter for follow-up examination after completed treatment for conditions other than malignant neoplasm: Secondary | ICD-10-CM

## 2014-06-09 DIAGNOSIS — B1920 Unspecified viral hepatitis C without hepatic coma: Secondary | ICD-10-CM | POA: Insufficient documentation

## 2014-06-16 ENCOUNTER — Telehealth: Payer: Self-pay

## 2014-06-16 ENCOUNTER — Ambulatory Visit: Payer: Medicare Other

## 2014-06-16 NOTE — Telephone Encounter (Signed)
Pt is calling for [place] results. Please advise. Her call back number is [phone]

## 2014-06-16 NOTE — Telephone Encounter (Signed)
See result note.  

## 2014-06-16 NOTE — Progress Notes (Signed)
Quick Note:  Stable findings of cirrhosis. Repeat in 6 months. ______

## 2014-06-17 NOTE — Progress Notes (Signed)
ON RECALL LIST  °

## 2014-06-18 ENCOUNTER — Ambulatory Visit (INDEPENDENT_AMBULATORY_CARE_PROVIDER_SITE_OTHER): Payer: Medicare Other

## 2014-06-18 DIAGNOSIS — B351 Tinea unguium: Secondary | ICD-10-CM

## 2014-06-18 DIAGNOSIS — E1140 Type 2 diabetes mellitus with diabetic neuropathy, unspecified: Secondary | ICD-10-CM

## 2014-06-18 DIAGNOSIS — M79673 Pain in unspecified foot: Secondary | ICD-10-CM | POA: Diagnosis not present

## 2014-06-18 DIAGNOSIS — E1165 Type 2 diabetes mellitus with hyperglycemia: Secondary | ICD-10-CM

## 2014-06-18 DIAGNOSIS — IMO0002 Reserved for concepts with insufficient information to code with codable children: Secondary | ICD-10-CM

## 2014-06-18 NOTE — Progress Notes (Signed)
Presents today chief complaint of painful elongated toenails.  Objective: Pulses are palpable bilateral nails are thick, yellow dystrophic onychomycosis and painful palpation.   Assessment: Onychomycosis with pain in limb.  Plan: Treatment of nails in thickness and length as covered service secondary to pain.  

## 2014-07-02 ENCOUNTER — Ambulatory Visit (HOSPITAL_COMMUNITY)
Admission: RE | Admit: 2014-07-02 | Discharge: 2014-07-02 | Disposition: A | Discharge status: Home / Self Care | Payer: Medicare Other | Source: Ambulatory Visit | Attending: Pulmonary Disease | Admitting: Pulmonary Disease

## 2014-07-02 DIAGNOSIS — R609 Edema, unspecified: Secondary | ICD-10-CM | POA: Diagnosis present

## 2014-07-02 DIAGNOSIS — R600 Localized edema: Secondary | ICD-10-CM | POA: Diagnosis not present

## 2014-09-01 ENCOUNTER — Ambulatory Visit: Payer: Medicare Other

## 2014-09-01 ENCOUNTER — Ambulatory Visit (INDEPENDENT_AMBULATORY_CARE_PROVIDER_SITE_OTHER): Payer: Medicare Other | Admitting: Podiatry

## 2014-09-01 ENCOUNTER — Encounter: Payer: Self-pay | Admitting: Podiatry

## 2014-09-01 DIAGNOSIS — M79673 Pain in unspecified foot: Secondary | ICD-10-CM | POA: Diagnosis not present

## 2014-09-01 DIAGNOSIS — B351 Tinea unguium: Secondary | ICD-10-CM

## 2014-09-01 NOTE — Progress Notes (Signed)
Patient presents to the office today with a chief complaint of painful elongated toenails.  Objective: Pulses are palpable bilateral. Nails are thick yellow dystrophic clinically mycotic and painful palpation.  Assessment: Pain in limb secondary to onychomycosis 1 through 5 bilateral.  Plan: Debridement of nails 1 through 5 bilateral covered service secondary to pain.  

## 2014-10-19 ENCOUNTER — Ambulatory Visit (HOSPITAL_COMMUNITY)
Admission: RE | Admit: 2014-10-19 | Discharge: 2014-10-19 | Disposition: A | Discharge status: Home / Self Care | Payer: Medicare Other | Source: Ambulatory Visit | Attending: Pulmonary Disease | Admitting: Pulmonary Disease

## 2014-10-19 ENCOUNTER — Other Ambulatory Visit (HOSPITAL_COMMUNITY): Payer: Self-pay | Admitting: Pulmonary Disease

## 2014-10-19 DIAGNOSIS — M25522 Pain in left elbow: Secondary | ICD-10-CM

## 2014-10-19 DIAGNOSIS — M7032 Other bursitis of elbow, left elbow: Secondary | ICD-10-CM | POA: Insufficient documentation

## 2014-11-04 ENCOUNTER — Other Ambulatory Visit: Payer: Self-pay | Admitting: Orthopedic Surgery

## 2014-11-06 ENCOUNTER — Other Ambulatory Visit: Payer: Self-pay | Admitting: Orthopedic Surgery

## 2014-11-16 ENCOUNTER — Telehealth: Payer: Self-pay | Admitting: Internal Medicine

## 2014-11-16 NOTE — Telephone Encounter (Signed)
Letter mail

## 2014-11-16 NOTE — Telephone Encounter (Signed)
ON RECALL FOR ULTRASOUND ABD

## 2014-12-01 ENCOUNTER — Ambulatory Visit (INDEPENDENT_AMBULATORY_CARE_PROVIDER_SITE_OTHER): Payer: Medicare Other | Admitting: Podiatry

## 2014-12-01 ENCOUNTER — Encounter: Payer: Self-pay | Admitting: Podiatry

## 2014-12-01 VITALS — BP 95/44 | HR 66 | Resp 16

## 2014-12-01 DIAGNOSIS — B351 Tinea unguium: Secondary | ICD-10-CM

## 2014-12-01 DIAGNOSIS — M79676 Pain in unspecified toe(s): Secondary | ICD-10-CM | POA: Diagnosis not present

## 2014-12-01 DIAGNOSIS — E1140 Type 2 diabetes mellitus with diabetic neuropathy, unspecified: Secondary | ICD-10-CM

## 2014-12-01 DIAGNOSIS — IMO0002 Reserved for concepts with insufficient information to code with codable children: Secondary | ICD-10-CM

## 2014-12-01 DIAGNOSIS — E1165 Type 2 diabetes mellitus with hyperglycemia: Secondary | ICD-10-CM

## 2014-12-01 NOTE — Progress Notes (Signed)
She presents today for chief complaint of painful elongated toenails and porokeratotic lesions plantar aspect of the bilateral foot.  Objective: Vital signs are stable she is alert and oriented 3 states that her diabetes is under control. Her nails are long thick yellow dystrophic with mycotic and painful palpation. Porokeratosis noted to the plantar forefoot are minimal but tender.  Assessment: No open lesions or wounds for this diabetic individual pain in limb secondary to onychomycosis 1 through 5 bilateral. Porokeratosis bilateral.  Plan: Debridement of nails in thickness and length is covered service secondary to pain also debrided react hyperkeratoses.

## 2014-12-11 ENCOUNTER — Encounter (HOSPITAL_COMMUNITY): Payer: Self-pay

## 2014-12-11 ENCOUNTER — Encounter (HOSPITAL_COMMUNITY)
Admission: RE | Admit: 2014-12-11 | Discharge: 2014-12-11 | Disposition: A | Discharge status: Home / Self Care | Payer: Medicare Other | Source: Ambulatory Visit | Attending: Specialist | Admitting: Specialist

## 2014-12-11 DIAGNOSIS — Z01818 Encounter for other preprocedural examination: Secondary | ICD-10-CM | POA: Diagnosis present

## 2014-12-11 DIAGNOSIS — M179 Osteoarthritis of knee, unspecified: Secondary | ICD-10-CM | POA: Insufficient documentation

## 2014-12-11 HISTORY — DX: Anemia, unspecified: D64.9

## 2014-12-11 HISTORY — DX: Nocturia: R35.1

## 2014-12-11 HISTORY — DX: Repeated falls: R29.6

## 2014-12-11 HISTORY — DX: Abrasion of left upper arm, initial encounter: S40.812A

## 2014-12-11 LAB — PROTIME-INR
INR: 1.09 (ref 0.00–1.49)
PROTHROMBIN TIME: 14.2 s (ref 11.6–15.2)

## 2014-12-11 LAB — URINALYSIS, ROUTINE W REFLEX MICROSCOPIC
GLUCOSE, UA: NEGATIVE mg/dL
HGB URINE DIPSTICK: NEGATIVE
Ketones, ur: NEGATIVE mg/dL
Nitrite: POSITIVE — AB
PH: 5.5 (ref 5.0–8.0)
PROTEIN: 30 mg/dL — AB
Specific Gravity, Urine: 1.023 (ref 1.005–1.030)
Urobilinogen, UA: 1 mg/dL (ref 0.0–1.0)

## 2014-12-11 LAB — BASIC METABOLIC PANEL
ANION GAP: 6 (ref 5–15)
BUN: 21 mg/dL — AB (ref 6–20)
CO2: 31 mmol/L (ref 22–32)
Calcium: 9.3 mg/dL (ref 8.9–10.3)
Chloride: 102 mmol/L (ref 101–111)
Creatinine, Ser: 0.94 mg/dL (ref 0.44–1.00)
GFR calc Af Amer: >60 mL/min (ref >60)
GFR, EST NON AFRICAN AMERICAN: 57 mL/min — AB (ref >60)
Glucose, Bld: 116 mg/dL — ABNORMAL HIGH (ref 65–99)
POTASSIUM: 4.7 mmol/L (ref 3.5–5.1)
SODIUM: 139 mmol/L (ref 135–145)

## 2014-12-11 LAB — CBC
HEMATOCRIT: 36.3 % (ref 36.0–46.0)
HEMOGLOBIN: 11.1 g/dL — AB (ref 12.0–15.0)
MCH: 29.4 pg (ref 26.0–34.0)
MCHC: 30.6 g/dL (ref 30.0–36.0)
MCV: 96 fL (ref 78.0–100.0)
Platelets: 218 10*3/uL (ref 150–400)
RBC: 3.78 MIL/uL — ABNORMAL LOW (ref 3.87–5.11)
RDW: 13.7 % (ref 11.5–15.5)
WBC: 7.7 10*3/uL (ref 4.0–10.5)

## 2014-12-11 LAB — SURGICAL PCR SCREEN
MRSA, PCR: NEGATIVE
Staphylococcus aureus: NEGATIVE

## 2014-12-11 LAB — URINE MICROSCOPIC-ADD ON

## 2014-12-11 LAB — APTT: APTT: 30 s (ref 24–37)

## 2014-12-11 LAB — ABO/RH: ABO/RH(D): O NEG

## 2014-12-11 NOTE — Patient Instructions (Signed)
[name]  12/11/2014   Your procedure is scheduled on: Friday December 18, 2014   Report to [facility] Main  Entrance take [place]  elevators to 3rd floor to  [place] at 11:00 AM.  Call this number if you have problems the morning of surgery [phone]   Remember: ONLY 1 PERSON MAY GO WITH YOU TO SHORT STAY TO GET  READY MORNING OF [place].  Do not eat food After Midnight but may take clear liquids till 7:00 am day of surgery then nothing by mouth.      Take these medicines the morning of surgery with A SIP OF WATER: Amlodipine (Norvasc); Arimidex); Oxycodone if needed; Gabapentin (Neurontin); Omeprazole (Prilosec); Hydralazine (Apresoline); Metoprolol  DO NOT TAKE ANY DIABETIC MEDICATIONS DAY OF YOUR SURGERY                               You may not have any metal on your body including hair pins and              piercings  Do not wear jewelry, make-up, lotions, powders or perfumes, deodorant             Do not wear nail polish.  Do not shave  48 hours prior to surgery.              Do not bring valuables to the hospital. [place].  Contacts, dentures or bridgework may not be worn into surgery.  Leave suitcase in the car. After surgery it may be brought to your room.                Please read over the following fact sheets you were given:MRSA INFORMATION SHEET; INCENTIVE SPIROMETER; BLOOD TRANSFUSION INFORMATION SHEET _____________________________________________________________________             [facility] - Preparing for Surgery Before surgery, you can play an important role.  Because skin is not sterile, your skin needs to be as free of germs as possible.  You can reduce the number of germs on your skin by washing with CHG (chlorahexidine gluconate) soap before surgery.  CHG is an antiseptic cleaner which kills germs and bonds with the skin to continue killing germs even after  washing. Please DO NOT use if you have an allergy to CHG or antibacterial soaps.  If your skin becomes reddened/irritated stop using the CHG and inform your nurse when you arrive at Short Stay. Do not shave (including legs and underarms) for at least 48 hours prior to the first CHG shower.  You may shave your face/neck. Please follow these instructions carefully:  1.  Shower with CHG Soap the night before surgery and the  morning of Surgery.  2.  If you choose to wash your hair, wash your hair first as usual with your  normal  shampoo.  3.  After you shampoo, rinse your hair and body thoroughly to remove the  shampoo.                           4.  Use CHG as you would any other liquid soap.  You can apply chg directly  to the skin and  wash                       Gently with a scrungie or clean washcloth.  5.  Apply the CHG Soap to your body ONLY FROM THE NECK DOWN.   Do not use on face/ open                           Wound or open sores. Avoid contact with eyes, ears mouth and genitals (private parts).                       Wash face,  Genitals (private parts) with your normal soap.             6.  Wash thoroughly, paying special attention to the area where your surgery  will be performed.  7.  Thoroughly rinse your body with warm water from the neck down.  8.  DO NOT shower/wash with your normal soap after using and rinsing off  the CHG Soap.                9.  Pat yourself dry with a clean towel.            10.  Wear clean pajamas.            11.  Place clean sheets on your bed the night of your first shower and do not  sleep with pets. Day of Surgery : Do not apply any lotions/deodorants the morning of surgery.  Please wear clean clothes to the hospital/surgery center.  FAILURE TO FOLLOW THESE INSTRUCTIONS MAY RESULT IN THE CANCELLATION OF YOUR SURGERY PATIENT SIGNATURE_________________________________  NURSE  SIGNATURE__________________________________  ________________________________________________________________________    CLEAR LIQUID DIET   Foods Allowed                                                                     Foods Excluded  Coffee and tea, regular and decaf                             liquids that you cannot  Plain Jell-O in any flavor                                             see through such as: Fruit ices (not with fruit pulp)                                     milk, soups, orange juice  Iced Popsicles                                    All solid food Carbonated beverages, regular and diet  Cranberry, grape and apple juices Sports drinks like Gatorade Lightly seasoned clear broth or consume(fat free) Sugar, honey syrup  Sample Menu Breakfast                                Lunch                                     Supper Cranberry juice                    Beef broth                            Chicken broth Jell-O                                     Grape juice                           Apple juice Coffee or tea                        Jell-O                                      Popsicle                                                Coffee or tea                        Coffee or tea  _____________________________________________________________________    Incentive Spirometer  An incentive spirometer is a tool that can help keep your lungs clear and active. This tool measures how well you are filling your lungs with each breath. Taking long deep breaths may help reverse or decrease the chance of developing breathing (pulmonary) problems (especially infection) following:  A long period of time when you are unable to move or be active. BEFORE THE PROCEDURE   If the spirometer includes an indicator to show your best effort, your nurse or respiratory therapist will set it to a desired goal.  If possible, sit up straight or lean  slightly forward. Try not to slouch.  Hold the incentive spirometer in an upright position. INSTRUCTIONS FOR USE   Sit on the edge of your bed if possible, or sit up as far as you can in bed or on a chair.  Hold the incentive spirometer in an upright position.  Breathe out normally.  Place the mouthpiece in your mouth and seal your lips tightly around it.  Breathe in slowly and as deeply as possible, raising the piston or the ball toward the top of the column.  Hold your breath for 3-5 seconds or for as long as possible. Allow the piston or ball to fall to the bottom of the column.  Remove the mouthpiece from your mouth and breathe out normally.  Rest for a few seconds and repeat Steps 1 through 7 at  least 10 times every 1-2 hours when you are awake. Take your time and take a few normal breaths between deep breaths.  The spirometer may include an indicator to show your best effort. Use the indicator as a goal to work toward during each repetition.  After each set of 10 deep breaths, practice coughing to be sure your lungs are clear. If you have an incision (the cut made at the time of surgery), support your incision when coughing by placing a pillow or rolled up towels firmly against it. Once you are able to get out of bed, walk around indoors and cough well. You may stop using the incentive spirometer when instructed by your caregiver.  RISKS AND COMPLICATIONS  Take your time so you do not get dizzy or light-headed.  If you are in pain, you may need to take or ask for pain medication before doing incentive spirometry. It is harder to take a deep breath if you are having pain. AFTER USE  Rest and breathe slowly and easily.  It can be helpful to keep track of a log of your progress. Your caregiver can provide you with a simple table to help with this. If you are using the spirometer at home, follow these instructions: [place] IF:   You are having difficultly using the  spirometer.  You have trouble using the spirometer as often as instructed.  Your pain medication is not giving enough relief while using the spirometer.  You develop fever of 100.5 F (38.1 C) or higher. SEEK IMMEDIATE MEDICAL CARE IF:   You cough up bloody sputum that had not been present before.  You develop fever of 102 F (38.9 C) or greater.  You develop worsening pain at or near the incision site. MAKE SURE YOU:   Understand these instructions.  Will watch your condition.  Will get help right away if you are not doing well or get worse. Document Released: 06/26/2006 Document Revised: 05/08/2011 Document Reviewed: 08/27/2006 Exit[employer] Patient Information 2014 [employer], [place].   ________________________________________________________________________  WHAT IS A BLOOD TRANSFUSION? Blood Transfusion Information  A transfusion is the replacement of blood or some of its parts. Blood is made up of multiple cells which provide different functions.  Red blood cells carry oxygen and are used for blood loss replacement.  White blood cells fight against infection.  Platelets control bleeding.  Plasma helps clot blood.  Other blood products are available for specialized needs, such as hemophilia or other clotting disorders. BEFORE THE TRANSFUSION  Who gives blood for transfusions?   Healthy volunteers who are fully evaluated to make sure their blood is safe. This is blood bank blood. Transfusion therapy is the safest it has ever been in the practice of medicine. Before blood is taken from a donor, a complete history is taken to make sure that person has no history of diseases nor engages in risky social behavior (examples are intravenous drug use or sexual activity with multiple partners). The donor's travel history is screened to minimize risk of transmitting infections, such as malaria. The donated blood is tested for signs of infectious diseases, such as HIV and hepatitis.  The blood is then tested to be sure it is compatible with you in order to minimize the chance of a transfusion reaction. If you or a relative donates blood, this is often done in anticipation of surgery and is not appropriate for emergency situations. It takes many days to process the donated blood. RISKS AND COMPLICATIONS Although transfusion  therapy is very safe and saves many lives, the main dangers of transfusion include:   Getting an infectious disease.  Developing a transfusion reaction. This is an allergic reaction to something in the blood you were given. Every precaution is taken to prevent this. The decision to have a blood transfusion has been considered carefully by your caregiver before blood is given. Blood is not given unless the benefits outweigh the risks. AFTER THE TRANSFUSION  Right after receiving a blood transfusion, you will usually feel much better and more energetic. This is especially true if your red blood cells have gotten low (anemic). The transfusion raises the level of the red blood cells which carry oxygen, and this usually causes an energy increase.  The nurse administering the transfusion will monitor you carefully for complications. HOME CARE INSTRUCTIONS  No special instructions are needed after a transfusion. You may find your energy is better. Speak with your caregiver about any limitations on activity for underlying diseases you may have. SEEK MEDICAL CARE IF:   Your condition is not improving after your transfusion.  You develop redness or irritation at the intravenous (IV) site. SEEK IMMEDIATE MEDICAL CARE IF:  Any of the following symptoms occur over the next 12 hours:  Shaking chills.  You have a temperature by mouth above 102 F (38.9 C), not controlled by medicine.  Chest, back, or muscle pain.  People around you feel you are not acting correctly or are confused.  Shortness of breath or difficulty breathing.  Dizziness and fainting.  You  get a rash or develop hives.  You have a decrease in urine output.  Your urine turns a dark color or changes to pink, red, or brown. Any of the following symptoms occur over the next 10 days:  You have a temperature by mouth above 102 F (38.9 C), not controlled by medicine.  Shortness of breath.  Weakness after normal activity.  The white part of the eye turns yellow (jaundice).  You have a decrease in the amount of urine or are urinating less often.  Your urine turns a dark color or changes to pink, red, or brown. Document Released: 02/11/2000 Document Revised: 05/08/2011 Document Reviewed: 09/30/2007 [facility] Patient Information 2014 [place], [place].  _______________________________________________________________________

## 2014-12-11 NOTE — Progress Notes (Addendum)
ECHO epic 07/02/2014  Clearance note per chart per Dr Luan Pulling 10/26/2014

## 2014-12-14 NOTE — Anesthesia Preprocedure Evaluation (Addendum)
Anesthesia Evaluation  Patient identified by MRN, date of birth, ID band Patient awake    Reviewed: Allergy & Precautions, NPO status , Patient's Chart, lab work & pertinent test results, reviewed documented beta blocker date and time   History of Anesthesia Complications (+) PONV  Airway Mallampati: II   Neck ROM: Full    Dental  (+) Dental Advisory Given   Pulmonary    breath sounds clear to auscultation       Cardiovascular hypertension,  Rhythm:Regular  ECHO 06/2014 EF 123456, grade I diastolic dysfunctrion   Neuro/Psych    GI/Hepatic GERD  Medicated,  Endo/Other  diabetes, Type 2, Insulin Dependent  Renal/GU      Musculoskeletal   Abdominal (+)  Abdomen: soft.    Peds  Hematology 11/36, plts 218, INR 1.09   Anesthesia Other Findings   Reproductive/Obstetrics                            Anesthesia Physical Anesthesia Plan  ASA: III  Anesthesia Plan: Spinal   Post-op Pain Management:    Induction:   Airway Management Planned:   Additional Equipment:   Intra-op Plan:   Post-operative Plan:   Informed Consent: I have reviewed the patients History and Physical, chart, labs and discussed the procedure including the risks, benefits and alternatives for the proposed anesthesia with the patient or authorized representative who has indicated his/her understanding and acceptance.     Plan Discussed with:   Anesthesia Plan Comments:         Anesthesia Quick Evaluation

## 2014-12-14 NOTE — Progress Notes (Signed)
Urinalysis and micro results per epic per PAT 12/11/2014 sent to Dr Theda Sers

## 2014-12-14 NOTE — H&P (Signed)
TOTAL KNEE ADMISSION H&P  Patient is being admitted for right total knee arthroplasty.  Subjective:  Chief Complaint:right knee pain.  HPI: [name], 81 y.o. female, has a history of pain and functional disability in the right knee due to arthritis and has failed non-surgical conservative treatments for greater than 12 weeks to includeNSAID's and/or analgesics, viscosupplementation injections, supervised PT with diminished ADL's post treatment, use of assistive devices, weight reduction as appropriate and activity modification.  Onset of symptoms was gradual, starting 4 years ago with gradually worsening course since that time. The patient noted no past surgery on the right knee(s).  Patient currently rates pain in the right knee(s) at 7 out of 10 with activity. Patient has worsening of pain with activity and weight bearing, pain that interferes with activities of daily living, pain with passive range of motion and joint swelling.  Patient has evidence of subchondral sclerosis, periarticular osteophytes, joint subluxation and joint space narrowing by imaging studies. This patient has had osteoarthritis. There is no active infection.  Patient Active Problem List   Diagnosis Date Noted  . IDA (iron deficiency anemia) 10/29/2013  . Lumbago 08/06/2013  . Difficulty in walking(719.7) 08/06/2013  . Bilateral leg weakness 09/23/2012  . Acute renal failure ([place]) 07/25/2012  . Dehydration 07/25/2012  . DM type 2, uncontrolled, with neuropathy ([place]) 07/25/2012  . DM (diabetes mellitus), type 2, uncontrolled ([place]) 07/25/2012  . Essential hypertension, benign 07/25/2012  . Generalized weakness 07/25/2012  . Hyperlipidemia   . Chronic kidney disease   . Skin cancer   . Hypertension   . GERD (gastroesophageal reflux disease)   . Breast cancer ([place]) 02/19/2012  . CAP (community acquired pneumonia) 04/19/2011  . Nausea and vomiting 04/19/2011  . HTN (hypertension) 04/19/2011  . DM type 2  (diabetes mellitus, type 2) ([place]) 04/19/2011  . Arthritis 04/19/2011   Past Medical History  Diagnosis Date  . Diabetes mellitus   . Arthritis   . Hyperlipidemia   . Mental disorder   . Chronic kidney disease     renal stones, one episode of Lithotripsy  . GERD (gastroesophageal reflux disease)   . Skin cancer     face- melanoma, 2012- surg. excision    . Breast cancer, left ([place])     er/pr +  . Hypertension     followed by  grp. relative to  urology study grp.  Doesn't report ever having a stress test   . Complication of anesthesia   . PONV (postoperative nausea and vomiting)   . Cataract     no surgery as yet,starting of 3 years  . Seasonal allergies   . Cirrhosis ([facility])     Metavir score 4  . Nocturia   . Anemia   . Multiple falls   . Abrasion of arm, left     secondary to fall     Past Surgical History  Procedure Laterality Date  . Cholecystectomy    . Abdominal hysterectomy    . Skin biopsy    . Shoulder surgery    . Tubal ligation    . Fracture surgery      1975- ORIF- L ankle   . Breast lumpectomy with needle localization and axillary sentinel lymph node bx  03/12/2012    Procedure: BREAST LUMPECTOMY WITH NEEDLE LOCALIZATION AND AXILLARY SENTINEL LYMPH NODE BX;  Surgeon: Merrie Roof, MD;  Location: [facility];  Service: General;  Laterality: Left;  . Re-excision of breast lumpectomy  03/22/2012  Procedure: RE-EXCISION OF BREAST LUMPECTOMY;  Surgeon: Merrie Roof, MD;  Location: [facility];  Service: General;  Laterality: Left;  re-excision left breast lumpectomy cavity  ER+, PR+  . Rotator cuff repair Bilateral   . Colonoscopy N/A 11/20/2013    Dr.Rourk- diverticula was found in the left colon. o/w normal rectal, colonic and terminal ileal mucosa  . Esophagogastroduodenoscopy N/A 11/20/2013    Dr.Rourk- abnormal mucosa was found. diffuse snake skinning and friability of the gastric mucosa. patent pylorus. abnormal-appearing first, second and  third portion of the duodenum. 46mm gastric nodule at the GE junction. stomach bx= minimal chronic infammation, GE junction bx= polypoid fragments of gastric cardia type mucosa w/mod chronic inflammation and foveolar hyperplasia  . Givens capsule study N/A 02/24/2014    Procedure: GIVENS CAPSULE STUDY;  Surgeon: Daneil Dolin, MD;  [facility];  Service: Endoscopy;  Laterality: N/A;  . Breast surgery      lumpectomy x2/left   . Eye surgery      cataract surgery bilat   . Back surgery      x4 back surgery to include rods & screws  . Last in May 2014    No prescriptions prior to admission   Allergies  Allergen Reactions  . Demerol Nausea Only  . Other Other (See Comments)    Bee stings-swelling    Social History  Substance Use Topics  . Smoking status: Never Smoker   . Smokeless tobacco: Never Used  . Alcohol Use: No    Family History  Problem Relation Age of Onset  . Heart disease Father   . Colon cancer Neg Hx      Review of Systems  Constitutional: Negative.   HENT: Negative.   Eyes: Negative.   Cardiovascular: Negative.   Gastrointestinal: Negative.   Genitourinary: Negative.   Musculoskeletal: Positive for joint pain.  Skin: Negative.   Neurological: Negative.   Endo/Heme/Allergies: Negative.   Psychiatric/Behavioral: Negative.     Objective:  Physical Exam  Constitutional: She is oriented to person, place, and time. She appears well-developed.  HENT:  Head: Normocephalic.  Eyes: EOM are normal.  Neck: Normal range of motion.  Cardiovascular: Normal rate, normal heart sounds and intact distal pulses.   Respiratory: Effort normal and breath sounds normal.  GI: Soft.  Genitourinary:  deferred  Musculoskeletal:  Right knee pain with ROM  Neurological: She is alert and oriented to person, place, and time. She has normal reflexes.  Skin: Skin is warm and dry.  Healing Abrasion to left forearm  Psychiatric: Her behavior is normal.    Vital  signs in last 24 hours:    Labs:   Estimated body mass index is 33.08 kg/(m^2) as calculated from the following:   Height as of 02/24/14: 5\' 1"  (1.549 m).   Weight as of 02/24/14: 79.379 kg (175 lb).   Imaging Review Plain radiographs demonstrate severe degenerative joint disease of the right knee(s). The overall alignment issignificant varus. The bone quality appears to be good for age and reported activity level.  Assessment/Plan:  End stage arthritis, right knee   The patient history, physical examination, clinical judgment of the provider and imaging studies are consistent with end stage degenerative joint disease of the right knee(s) and total knee arthroplasty is deemed medically necessary. The treatment options including medical management, injection therapy arthroscopy and arthroplasty were discussed at length. The risks and benefits of total knee arthroplasty were presented and reviewed. The risks due to  aseptic loosening, infection, stiffness, patella tracking problems, thromboembolic complications and other imponderables were discussed. The patient acknowledged the explanation, agreed to proceed with the plan and consent was signed. Patient is being admitted for inpatient treatment for surgery, pain control, PT, OT, prophylactic antibiotics, VTE prophylaxis, progressive ambulation and ADL's and discharge planning. The patient is planning to be discharged to skilled nursing facility.  Contraindications and adverse affects of Tranexamic acid discussed in detail. Patient denies any of these at this time and understands the risks and benefits.

## 2014-12-18 ENCOUNTER — Encounter (HOSPITAL_COMMUNITY)
Admission: RE | Disposition: A | Discharge status: Skilled Nursing Facility | Payer: Self-pay | Source: Ambulatory Visit | Attending: Specialist

## 2014-12-18 ENCOUNTER — Inpatient Hospital Stay (HOSPITAL_COMMUNITY): Payer: Medicare Other | Admitting: Anesthesiology

## 2014-12-18 ENCOUNTER — Inpatient Hospital Stay (HOSPITAL_COMMUNITY)
Admission: RE | Admit: 2014-12-18 | Discharge: 2014-12-21 | DRG: 470 | Disposition: A | Discharge status: Skilled Nursing Facility | Payer: Medicare Other | Source: Ambulatory Visit | Attending: Specialist | Admitting: Specialist

## 2014-12-18 ENCOUNTER — Encounter (HOSPITAL_COMMUNITY): Payer: Self-pay | Admitting: *Deleted

## 2014-12-18 DIAGNOSIS — K219 Gastro-esophageal reflux disease without esophagitis: Secondary | ICD-10-CM | POA: Diagnosis present

## 2014-12-18 DIAGNOSIS — E785 Hyperlipidemia, unspecified: Secondary | ICD-10-CM | POA: Diagnosis present

## 2014-12-18 DIAGNOSIS — E1140 Type 2 diabetes mellitus with diabetic neuropathy, unspecified: Secondary | ICD-10-CM | POA: Diagnosis present

## 2014-12-18 DIAGNOSIS — Z79899 Other long term (current) drug therapy: Secondary | ICD-10-CM | POA: Diagnosis not present

## 2014-12-18 DIAGNOSIS — Z85820 Personal history of malignant melanoma of skin: Secondary | ICD-10-CM

## 2014-12-18 DIAGNOSIS — I10 Essential (primary) hypertension: Secondary | ICD-10-CM | POA: Diagnosis present

## 2014-12-18 DIAGNOSIS — Z853 Personal history of malignant neoplasm of breast: Secondary | ICD-10-CM | POA: Diagnosis not present

## 2014-12-18 DIAGNOSIS — Z01812 Encounter for preprocedural laboratory examination: Secondary | ICD-10-CM | POA: Diagnosis not present

## 2014-12-18 DIAGNOSIS — M1711 Unilateral primary osteoarthritis, right knee: Secondary | ICD-10-CM | POA: Diagnosis present

## 2014-12-18 DIAGNOSIS — M25561 Pain in right knee: Secondary | ICD-10-CM | POA: Diagnosis present

## 2014-12-18 DIAGNOSIS — Z96659 Presence of unspecified artificial knee joint: Secondary | ICD-10-CM

## 2014-12-18 HISTORY — PX: TOTAL KNEE ARTHROPLASTY: SHX125

## 2014-12-18 LAB — GLUCOSE, CAPILLARY
GLUCOSE-CAPILLARY: 50 mg/dL — AB (ref 65–99)
GLUCOSE-CAPILLARY: 69 mg/dL (ref 65–99)
GLUCOSE-CAPILLARY: 75 mg/dL (ref 65–99)
Glucose-Capillary: 39 mg/dL — CL (ref 65–99)
Glucose-Capillary: 101 mg/dL — ABNORMAL HIGH (ref 65–99)
Glucose-Capillary: 64 mg/dL — ABNORMAL LOW (ref 65–99)
Glucose-Capillary: 96 mg/dL (ref 65–99)
Glucose-Capillary: 115 mg/dL — ABNORMAL HIGH (ref 65–99)
Glucose-Capillary: 67 mg/dL (ref 65–99)
Glucose-Capillary: 126 mg/dL — ABNORMAL HIGH (ref 65–99)
Glucose-Capillary: 348 mg/dL — ABNORMAL HIGH (ref 65–99)

## 2014-12-18 LAB — TYPE AND SCREEN
ABO/RH(D): O NEG
Antibody Screen: NEGATIVE

## 2014-12-18 SURGERY — ARTHROPLASTY, KNEE, TOTAL
Anesthesia: Spinal | Site: Knee | Laterality: Right

## 2014-12-18 MED ORDER — EPHEDRINE SULFATE 50 MG/ML IJ SOLN
INTRAMUSCULAR | Status: AC
  Filled 2014-12-18: qty 1

## 2014-12-18 MED ORDER — PHENYLEPHRINE HCL 10 MG/ML IJ SOLN
10.0000 mg | INTRAVENOUS | Status: DC | PRN
  Administered 2014-12-18: 50 ug/min via INTRAVENOUS

## 2014-12-18 MED ORDER — METOPROLOL SUCCINATE ER 100 MG PO TB24
100.0000 mg | ORAL_TABLET | Freq: Every morning | ORAL | Status: DC
  Administered 2014-12-19 – 2014-12-21 (×3): 100 mg via ORAL
  Filled 2014-12-18 (×3): qty 1

## 2014-12-18 MED ORDER — DEXAMETHASONE SODIUM PHOSPHATE 10 MG/ML IJ SOLN
INTRAMUSCULAR | Status: AC
  Filled 2014-12-18: qty 1

## 2014-12-18 MED ORDER — DEXTROSE 50 % IV SOLN
INTRAVENOUS | Status: AC
  Administered 2014-12-18: 25 mL via INTRAVENOUS
  Filled 2014-12-18: qty 50

## 2014-12-18 MED ORDER — FENTANYL CITRATE (PF) 100 MCG/2ML IJ SOLN
INTRAMUSCULAR | Status: DC | PRN
  Administered 2014-12-18 (×3): 50 ug via INTRAVENOUS

## 2014-12-18 MED ORDER — EPHEDRINE SULFATE 50 MG/ML IJ SOLN
INTRAMUSCULAR | Status: DC | PRN
  Administered 2014-12-18 (×2): 10 mg via INTRAVENOUS

## 2014-12-18 MED ORDER — PROPOFOL 500 MG/50ML IV EMUL
INTRAVENOUS | Status: DC | PRN
  Administered 2014-12-18: 25 ug/kg/min via INTRAVENOUS

## 2014-12-18 MED ORDER — CEFAZOLIN SODIUM-DEXTROSE 2-3 GM-% IV SOLR
2.0000 g | INTRAVENOUS | Status: DC
  Administered 2014-12-18: 2 g via INTRAVENOUS

## 2014-12-18 MED ORDER — PROPOFOL 10 MG/ML IV BOLUS
INTRAVENOUS | Status: AC
  Filled 2014-12-18: qty 20

## 2014-12-18 MED ORDER — PHENYLEPHRINE HCL 10 MG/ML IJ SOLN
INTRAMUSCULAR | Status: AC
  Filled 2014-12-18: qty 1

## 2014-12-18 MED ORDER — FERROUS SULFATE 325 (65 FE) MG PO TABS
325.0000 mg | ORAL_TABLET | Freq: Three times a day (TID) | ORAL | Status: DC
  Administered 2014-12-19 – 2014-12-21 (×7): 325 mg via ORAL
  Filled 2014-12-18 (×10): qty 1

## 2014-12-18 MED ORDER — METHOCARBAMOL 500 MG PO TABS
500.0000 mg | ORAL_TABLET | Freq: Four times a day (QID) | ORAL | Status: DC | PRN
  Administered 2014-12-18 – 2014-12-19 (×3): 500 mg via ORAL
  Filled 2014-12-18 (×3): qty 1

## 2014-12-18 MED ORDER — FENTANYL CITRATE (PF) 250 MCG/5ML IJ SOLN
INTRAMUSCULAR | Status: AC
  Filled 2014-12-18: qty 25

## 2014-12-18 MED ORDER — ALUM & MAG HYDROXIDE-SIMETH 200-200-20 MG/5ML PO SUSP: 30.0000 mL | ORAL | Status: DC | PRN

## 2014-12-18 MED ORDER — ZOLPIDEM TARTRATE 5 MG PO TABS: 5.0000 mg | ORAL_TABLET | Freq: Every evening | ORAL | Status: DC | PRN

## 2014-12-18 MED ORDER — OXYCODONE HCL 5 MG PO TABS
5.0000 mg | ORAL_TABLET | ORAL | Status: DC | PRN
  Administered 2014-12-18: 5 mg via ORAL
  Administered 2014-12-18: 10 mg via ORAL
  Administered 2014-12-18: 5 mg via ORAL
  Administered 2014-12-19 – 2014-12-20 (×8): 10 mg via ORAL
  Administered 2014-12-21 (×2): 5 mg via ORAL
  Filled 2014-12-18 (×4): qty 2
  Filled 2014-12-18 (×2): qty 1
  Filled 2014-12-18 (×2): qty 2
  Filled 2014-12-18: qty 1
  Filled 2014-12-18: qty 2
  Filled 2014-12-18: qty 1
  Filled 2014-12-18: qty 2
  Filled 2014-12-18: qty 1
  Filled 2014-12-18: qty 2

## 2014-12-18 MED ORDER — PROMETHAZINE HCL 25 MG/ML IJ SOLN: 6.2500 mg | INTRAMUSCULAR | Status: DC | PRN

## 2014-12-18 MED ORDER — ONDANSETRON HCL 4 MG PO TABS: 4.0000 mg | ORAL_TABLET | Freq: Four times a day (QID) | ORAL | Status: DC | PRN

## 2014-12-18 MED ORDER — MAGNESIUM CITRATE PO SOLN: 1.0000 | Freq: Once | ORAL | Status: DC | PRN

## 2014-12-18 MED ORDER — PANTOPRAZOLE SODIUM 40 MG PO TBEC
40.0000 mg | DELAYED_RELEASE_TABLET | Freq: Every day | ORAL | Status: DC
  Administered 2014-12-19 – 2014-12-21 (×3): 40 mg via ORAL
  Filled 2014-12-18 (×3): qty 1

## 2014-12-18 MED ORDER — DEXTROSE 50 % IV SOLN
25.0000 mL | Freq: Once | INTRAVENOUS | Status: AC
  Administered 2014-12-18: 25 mL via INTRAVENOUS

## 2014-12-18 MED ORDER — LIDOCAINE HCL (CARDIAC) 20 MG/ML IV SOLN
INTRAVENOUS | Status: AC
  Filled 2014-12-18: qty 5

## 2014-12-18 MED ORDER — ACETAMINOPHEN 325 MG PO TABS
650.0000 mg | ORAL_TABLET | Freq: Four times a day (QID) | ORAL | Status: DC | PRN
  Administered 2014-12-21: 650 mg via ORAL
  Filled 2014-12-18: qty 2

## 2014-12-18 MED ORDER — METOCLOPRAMIDE HCL 5 MG/ML IJ SOLN
INTRAMUSCULAR | Status: DC | PRN
  Administered 2014-12-18: 10 mg via INTRAVENOUS

## 2014-12-18 MED ORDER — SODIUM CHLORIDE 0.9 % IJ SOLN
INTRAMUSCULAR | Status: AC
  Filled 2014-12-18: qty 50

## 2014-12-18 MED ORDER — METOCLOPRAMIDE HCL 5 MG PO TABS
5.0000 mg | ORAL_TABLET | Freq: Three times a day (TID) | ORAL | Status: DC | PRN
  Filled 2014-12-18: qty 2

## 2014-12-18 MED ORDER — KETOROLAC TROMETHAMINE 30 MG/ML IJ SOLN
INTRAMUSCULAR | Status: AC
  Filled 2014-12-18: qty 1

## 2014-12-18 MED ORDER — ATORVASTATIN CALCIUM 10 MG PO TABS
10.0000 mg | ORAL_TABLET | Freq: Every day | ORAL | Status: DC
  Administered 2014-12-18 – 2014-12-20 (×3): 10 mg via ORAL
  Filled 2014-12-18 (×4): qty 1

## 2014-12-18 MED ORDER — BUPIVACAINE-EPINEPHRINE 0.25% -1:200000 IJ SOLN
INTRAMUSCULAR | Status: AC
  Filled 2014-12-18: qty 1

## 2014-12-18 MED ORDER — MENTHOL 3 MG MT LOZG: 1.0000 | LOZENGE | OROMUCOSAL | Status: DC | PRN

## 2014-12-18 MED ORDER — METHOCARBAMOL 1000 MG/10ML IJ SOLN
500.0000 mg | Freq: Four times a day (QID) | INTRAVENOUS | Status: DC | PRN
  Filled 2014-12-18: qty 5

## 2014-12-18 MED ORDER — HYDROMORPHONE HCL 1 MG/ML IJ SOLN: 0.2500 mg | INTRAMUSCULAR | Status: DC | PRN

## 2014-12-18 MED ORDER — ONDANSETRON HCL 4 MG/2ML IJ SOLN: 4.0000 mg | Freq: Four times a day (QID) | INTRAMUSCULAR | Status: DC | PRN

## 2014-12-18 MED ORDER — POVIDONE-IODINE 7.5 % EX SOLN: Freq: Once | CUTANEOUS | Status: DC

## 2014-12-18 MED ORDER — METOCLOPRAMIDE HCL 5 MG/ML IJ SOLN
INTRAMUSCULAR | Status: AC
  Filled 2014-12-18: qty 2

## 2014-12-18 MED ORDER — LINAGLIPTIN 5 MG PO TABS
5.0000 mg | ORAL_TABLET | Freq: Every day | ORAL | Status: DC
  Administered 2014-12-19 – 2014-12-21 (×3): 5 mg via ORAL
  Filled 2014-12-18 (×3): qty 1

## 2014-12-18 MED ORDER — GLYBURIDE 5 MG PO TABS
5.0000 mg | ORAL_TABLET | Freq: Every day | ORAL | Status: DC
  Administered 2014-12-19 – 2014-12-21 (×3): 5 mg via ORAL
  Filled 2014-12-18 (×6): qty 1

## 2014-12-18 MED ORDER — ACETAMINOPHEN 650 MG RE SUPP: 650.0000 mg | Freq: Four times a day (QID) | RECTAL | Status: DC | PRN

## 2014-12-18 MED ORDER — SODIUM CHLORIDE 0.9 % IJ SOLN
INTRAMUSCULAR | Status: DC | PRN
  Administered 2014-12-18: 30 mL

## 2014-12-18 MED ORDER — VANCOMYCIN HCL IN DEXTROSE 1-5 GM/200ML-% IV SOLN
1000.0000 mg | Freq: Once | INTRAVENOUS | Status: AC
  Administered 2014-12-18: 1000 mg via INTRAVENOUS

## 2014-12-18 MED ORDER — SCOPOLAMINE 1 MG/3DAYS TD PT72
1.0000 | MEDICATED_PATCH | TRANSDERMAL | Status: DC
  Administered 2014-12-18: 1.5 mg via TRANSDERMAL
  Filled 2014-12-18: qty 1

## 2014-12-18 MED ORDER — PHENYLEPHRINE 40 MCG/ML (10ML) SYRINGE FOR IV PUSH (FOR BLOOD PRESSURE SUPPORT)
PREFILLED_SYRINGE | INTRAVENOUS | Status: AC
  Filled 2014-12-18: qty 10

## 2014-12-18 MED ORDER — DEXTROSE IN LACTATED RINGERS 5 % IV SOLN
INTRAVENOUS | Status: DC | PRN
  Administered 2014-12-18: 15:00:00 via INTRAVENOUS

## 2014-12-18 MED ORDER — INSULIN ASPART 100 UNIT/ML ~~LOC~~ SOLN
0.0000 [IU] | Freq: Three times a day (TID) | SUBCUTANEOUS | Status: DC
  Administered 2014-12-19: 11 [IU] via SUBCUTANEOUS
  Administered 2014-12-19: 5 [IU] via SUBCUTANEOUS
  Administered 2014-12-19: 8 [IU] via SUBCUTANEOUS
  Administered 2014-12-20: 5 [IU] via SUBCUTANEOUS

## 2014-12-18 MED ORDER — DOCUSATE SODIUM 100 MG PO CAPS
100.0000 mg | ORAL_CAPSULE | Freq: Two times a day (BID) | ORAL | Status: DC
  Administered 2014-12-18 – 2014-12-21 (×6): 100 mg via ORAL

## 2014-12-18 MED ORDER — KETOROLAC TROMETHAMINE 30 MG/ML IJ SOLN
INTRAMUSCULAR | Status: DC | PRN
  Administered 2014-12-18: 30 mg via INTRA_ARTICULAR

## 2014-12-18 MED ORDER — HYDRALAZINE HCL 25 MG PO TABS
25.0000 mg | ORAL_TABLET | Freq: Three times a day (TID) | ORAL | Status: DC
  Administered 2014-12-19 – 2014-12-21 (×7): 25 mg via ORAL
  Filled 2014-12-18 (×10): qty 1

## 2014-12-18 MED ORDER — DULOXETINE HCL 60 MG PO CPEP
60.0000 mg | ORAL_CAPSULE | Freq: Every day | ORAL | Status: DC
  Administered 2014-12-18 – 2014-12-20 (×3): 60 mg via ORAL
  Filled 2014-12-18 (×4): qty 1

## 2014-12-18 MED ORDER — DEXTROSE 50 % IV SOLN
INTRAVENOUS | Status: AC
  Filled 2014-12-18: qty 50

## 2014-12-18 MED ORDER — METOCLOPRAMIDE HCL 5 MG/ML IJ SOLN: 5.0000 mg | Freq: Three times a day (TID) | INTRAMUSCULAR | Status: DC | PRN

## 2014-12-18 MED ORDER — POLYETHYLENE GLYCOL 3350 17 G PO PACK
17.0000 g | PACK | Freq: Every day | ORAL | Status: DC | PRN
  Administered 2014-12-20: 17 g via ORAL
  Filled 2014-12-18: qty 1

## 2014-12-18 MED ORDER — MIDAZOLAM HCL 5 MG/5ML IJ SOLN
INTRAMUSCULAR | Status: DC | PRN
  Administered 2014-12-18: 1 mg via INTRAVENOUS

## 2014-12-18 MED ORDER — BUPIVACAINE IN DEXTROSE 0.75-8.25 % IT SOLN
INTRATHECAL | Status: DC | PRN
  Administered 2014-12-18: 15 mg via INTRATHECAL

## 2014-12-18 MED ORDER — CEFAZOLIN SODIUM-DEXTROSE 2-3 GM-% IV SOLR
INTRAVENOUS | Status: AC
  Filled 2014-12-18: qty 50

## 2014-12-18 MED ORDER — PHENOL 1.4 % MT LIQD: 1.0000 | OROMUCOSAL | Status: DC | PRN

## 2014-12-18 MED ORDER — DEXAMETHASONE SODIUM PHOSPHATE 10 MG/ML IJ SOLN
10.0000 mg | Freq: Once | INTRAMUSCULAR | Status: DC
  Filled 2014-12-18: qty 1

## 2014-12-18 MED ORDER — BISACODYL 10 MG RE SUPP: 10.0000 mg | Freq: Every day | RECTAL | Status: DC | PRN

## 2014-12-18 MED ORDER — ONDANSETRON HCL 4 MG/2ML IJ SOLN
INTRAMUSCULAR | Status: DC | PRN
Start: 2014-12-18 — End: 2014-12-18
  Administered 2014-12-18 (×2): 2 mg via INTRAVENOUS

## 2014-12-18 MED ORDER — POTASSIUM CHLORIDE IN NACL 20-0.9 MEQ/L-% IV SOLN
INTRAVENOUS | Status: DC
  Administered 2014-12-18: 75 mL/h via INTRAVENOUS
  Administered 2014-12-19: 05:00:00 via INTRAVENOUS
  Filled 2014-12-18 (×3): qty 1000

## 2014-12-18 MED ORDER — FENTANYL CITRATE (PF) 100 MCG/2ML IJ SOLN: 25.0000 ug | INTRAMUSCULAR | Status: DC | PRN

## 2014-12-18 MED ORDER — MIDAZOLAM HCL 2 MG/2ML IJ SOLN
INTRAMUSCULAR | Status: AC
  Filled 2014-12-18: qty 4

## 2014-12-18 MED ORDER — ACETAMINOPHEN 10 MG/ML IV SOLN
INTRAVENOUS | Status: AC
  Filled 2014-12-18: qty 100

## 2014-12-18 MED ORDER — VANCOMYCIN HCL IN DEXTROSE 1-5 GM/200ML-% IV SOLN
INTRAVENOUS | Status: AC
  Filled 2014-12-18: qty 200

## 2014-12-18 MED ORDER — DIPHENHYDRAMINE HCL 12.5 MG/5ML PO ELIX: 12.5000 mg | ORAL_SOLUTION | ORAL | Status: DC | PRN

## 2014-12-18 MED ORDER — ENOXAPARIN SODIUM 30 MG/0.3ML ~~LOC~~ SOLN
30.0000 mg | Freq: Two times a day (BID) | SUBCUTANEOUS | Status: DC
  Administered 2014-12-19: 30 mg via SUBCUTANEOUS
  Filled 2014-12-18 (×3): qty 0.3

## 2014-12-18 MED ORDER — BUPIVACAINE-EPINEPHRINE 0.25% -1:200000 IJ SOLN
INTRAMUSCULAR | Status: DC | PRN
  Administered 2014-12-18: 30 mL

## 2014-12-18 MED ORDER — DEXAMETHASONE SODIUM PHOSPHATE 10 MG/ML IJ SOLN
INTRAMUSCULAR | Status: DC | PRN
  Administered 2014-12-18: 10 mg via INTRAVENOUS

## 2014-12-18 MED ORDER — DEXAMETHASONE SODIUM PHOSPHATE 10 MG/ML IJ SOLN: 10.0000 mg | Freq: Once | INTRAMUSCULAR | Status: DC

## 2014-12-18 MED ORDER — ACETAMINOPHEN 10 MG/ML IV SOLN
INTRAVENOUS | Status: DC | PRN
  Administered 2014-12-18: 1000 mg via INTRAVENOUS

## 2014-12-18 MED ORDER — DEXTROSE 5 % IV SOLN
INTRAVENOUS | Status: DC
  Administered 2014-12-18: 11:00:00 via INTRAVENOUS

## 2014-12-18 MED ORDER — PROPOFOL 10 MG/ML IV BOLUS
INTRAVENOUS | Status: DC | PRN
  Administered 2014-12-18: 10 mg via INTRAVENOUS
  Administered 2014-12-18 (×2): 20 mg via INTRAVENOUS
  Administered 2014-12-18: 10 mg via INTRAVENOUS

## 2014-12-18 MED ORDER — INSULIN ASPART 100 UNIT/ML ~~LOC~~ SOLN
0.0000 [IU] | Freq: Every day | SUBCUTANEOUS | Status: DC
  Administered 2014-12-18 – 2014-12-19 (×2): 4 [IU] via SUBCUTANEOUS

## 2014-12-18 MED ORDER — ROCURONIUM BROMIDE 100 MG/10ML IV SOLN
INTRAVENOUS | Status: AC
  Filled 2014-12-18: qty 1

## 2014-12-18 MED ORDER — METFORMIN HCL 500 MG PO TABS
500.0000 mg | ORAL_TABLET | Freq: Every day | ORAL | Status: DC
  Filled 2014-12-18: qty 1

## 2014-12-18 MED ORDER — CEFAZOLIN SODIUM-DEXTROSE 2-3 GM-% IV SOLR
2.0000 g | Freq: Four times a day (QID) | INTRAVENOUS | Status: AC
  Administered 2014-12-18 – 2014-12-19 (×2): 2 g via INTRAVENOUS
  Filled 2014-12-18 (×2): qty 50

## 2014-12-18 MED ORDER — GABAPENTIN 300 MG PO CAPS
300.0000 mg | ORAL_CAPSULE | Freq: Three times a day (TID) | ORAL | Status: DC
  Administered 2014-12-18 – 2014-12-21 (×8): 300 mg via ORAL
  Filled 2014-12-18 (×11): qty 1

## 2014-12-18 MED ORDER — SODIUM CHLORIDE 0.9 % IR SOLN
Status: DC | PRN
  Administered 2014-12-18: 1000 mL

## 2014-12-18 MED ORDER — GLYBURIDE 2.5 MG PO TABS
2.5000 mg | ORAL_TABLET | Freq: Every day | ORAL | Status: DC
  Administered 2014-12-19 – 2014-12-20 (×2): 2.5 mg via ORAL
  Filled 2014-12-18 (×3): qty 1

## 2014-12-18 MED ORDER — STERILE WATER FOR IRRIGATION IR SOLN
Status: DC | PRN
  Administered 2014-12-18: 1000 mL

## 2014-12-18 MED ORDER — ONDANSETRON HCL 4 MG/2ML IJ SOLN
INTRAMUSCULAR | Status: AC
  Filled 2014-12-18: qty 2

## 2014-12-18 MED ORDER — DEXTROSE IN LACTATED RINGERS 5 % IV SOLN: INTRAVENOUS | Status: DC

## 2014-12-18 MED ORDER — INSULIN ASPART 100 UNIT/ML ~~LOC~~ SOLN: 0.0000 [IU] | SUBCUTANEOUS | Status: DC

## 2014-12-18 MED ORDER — HYDROMORPHONE HCL 1 MG/ML IJ SOLN: 1.0000 mg | INTRAMUSCULAR | Status: DC | PRN

## 2014-12-18 MED ORDER — LACTATED RINGERS IV SOLN
INTRAVENOUS | Status: DC
  Administered 2014-12-18: 16:00:00 via INTRAVENOUS
  Administered 2014-12-18: 1000 mL via INTRAVENOUS

## 2014-12-18 MED ORDER — GLYBURIDE-METFORMIN 2.5-500 MG PO TABS: 1.0000 | ORAL_TABLET | Freq: Two times a day (BID) | ORAL | Status: DC

## 2014-12-18 MED ORDER — PHENYLEPHRINE HCL 10 MG/ML IJ SOLN
INTRAMUSCULAR | Status: DC | PRN
  Administered 2014-12-18: 80 ug via INTRAVENOUS
  Administered 2014-12-18 (×3): 40 ug via INTRAVENOUS
  Administered 2014-12-18 (×2): 120 ug via INTRAVENOUS
  Administered 2014-12-18: 80 ug via INTRAVENOUS

## 2014-12-18 MED ORDER — ANASTROZOLE 1 MG PO TABS
1.0000 mg | ORAL_TABLET | Freq: Every day | ORAL | Status: DC
  Administered 2014-12-19 – 2014-12-21 (×3): 1 mg via ORAL
  Filled 2014-12-18 (×3): qty 1

## 2014-12-18 MED ORDER — LIDOCAINE HCL (CARDIAC) 20 MG/ML IV SOLN
INTRAVENOUS | Status: DC | PRN
  Administered 2014-12-18: 50 mg via INTRAVENOUS

## 2014-12-18 MED ORDER — 0.9 % SODIUM CHLORIDE (POUR BTL) OPTIME
TOPICAL | Status: DC | PRN
  Administered 2014-12-18: 1000 mL

## 2014-12-18 MED ORDER — TRANEXAMIC ACID 1000 MG/10ML IV SOLN
1000.0000 mg | INTRAVENOUS | Status: DC
  Filled 2014-12-18: qty 10

## 2014-12-18 MED ORDER — METFORMIN HCL 500 MG PO TABS
1000.0000 mg | ORAL_TABLET | Freq: Every day | ORAL | Status: DC
  Administered 2014-12-19: 1000 mg via ORAL
  Filled 2014-12-18 (×2): qty 2

## 2014-12-18 MED ORDER — BENAZEPRIL HCL 20 MG PO TABS
20.0000 mg | ORAL_TABLET | Freq: Every morning | ORAL | Status: DC
  Administered 2014-12-19: 20 mg via ORAL
  Filled 2014-12-18: qty 1

## 2014-12-18 SURGICAL SUPPLY — 70 items
BAG DECANTER FOR FLEXI CONT (MISCELLANEOUS) IMPLANT
BAG SPEC THK2 15X12 ZIP CLS (MISCELLANEOUS) ×2
BAG ZIPLOCK 12X15 (MISCELLANEOUS) ×4 IMPLANT
BANDAGE ELASTIC 4 VELCRO ST LF (GAUZE/BANDAGES/DRESSINGS) ×2 IMPLANT
BANDAGE ELASTIC 6 VELCRO ST LF (GAUZE/BANDAGES/DRESSINGS) ×2 IMPLANT
BANDAGE ESMARK 6X9 LF (GAUZE/BANDAGES/DRESSINGS) ×1 IMPLANT
BLADE SAG 18X100X1.27 (BLADE) ×2 IMPLANT
BLADE SAW SGTL 13.0X1.19X90.0M (BLADE) ×2 IMPLANT
BNDG CMPR 9X6 STRL LF SNTH (GAUZE/BANDAGES/DRESSINGS) ×1
BNDG ESMARK 6X9 LF (GAUZE/BANDAGES/DRESSINGS) ×2
BONE CEMENT GENTAMICIN (Cement) ×4 IMPLANT
CAP KNEE TOTAL 3 SIGMA ×1 IMPLANT
CEMENT BONE GENTAMICIN 40 (Cement) IMPLANT
CUFF TOURN SGL QUICK 34 (TOURNIQUET CUFF) ×2
CUFF TRNQT CYL 34X4X40X1 (TOURNIQUET CUFF) ×1 IMPLANT
DECANTER SPIKE VIAL GLASS SM (MISCELLANEOUS) ×2 IMPLANT
DRAPE EXTREMITY T 121X128X90 (DRAPE) ×2 IMPLANT
DRAPE POUCH INSTRU U-SHP 10X18 (DRAPES) ×2 IMPLANT
DRAPE SHEET LG 3/4 BI-LAMINATE (DRAPES) ×2 IMPLANT
DRAPE U-SHAPE 47X51 STRL (DRAPES) ×2 IMPLANT
DRSG AQUACEL AG ADV 3.5X10 (GAUZE/BANDAGES/DRESSINGS) ×2 IMPLANT
DRSG OPSITE POSTOP 4X6 (GAUZE/BANDAGES/DRESSINGS) ×1 IMPLANT
DRSG TEGADERM 4X4.75 (GAUZE/BANDAGES/DRESSINGS) ×2 IMPLANT
DURAPREP 26ML APPLICATOR (WOUND CARE) ×2 IMPLANT
ELECT REM PT RETURN 9FT ADLT (ELECTROSURGICAL) ×2
ELECTRODE REM PT RTRN 9FT ADLT (ELECTROSURGICAL) ×1 IMPLANT
EVACUATOR 1/8 PVC DRAIN (DRAIN) ×2 IMPLANT
FACESHIELD WRAPAROUND (MASK) ×10 IMPLANT
FACESHIELD WRAPAROUND OR TEAM (MASK) ×5 IMPLANT
GAUZE SPONGE 2X2 8PLY STRL LF (GAUZE/BANDAGES/DRESSINGS) ×1 IMPLANT
GLOVE BIOGEL PI IND STRL 8 (GLOVE) ×2 IMPLANT
GLOVE BIOGEL PI INDICATOR 8 (GLOVE) ×2
GLOVE SURG ORTHO 8.0 STRL STRW (GLOVE) ×2 IMPLANT
GLOVE SURG ORTHO 9.0 STRL STRW (GLOVE) ×2 IMPLANT
GLOVE SURG SS PI 7.5 STRL IVOR (GLOVE) ×2 IMPLANT
GOWN STRL REUS W/TWL XL LVL3 (GOWN DISPOSABLE) ×4 IMPLANT
HANDPIECE INTERPULSE COAX TIP (DISPOSABLE) ×2
IMMOBILIZER KNEE 20 (SOFTGOODS) ×1 IMPLANT
KIT BASIN OR (CUSTOM PROCEDURE TRAY) ×2 IMPLANT
LIQUID BAND (GAUZE/BANDAGES/DRESSINGS) ×2 IMPLANT
NDL SAFETY ECLIPSE 18X1.5 (NEEDLE) ×1 IMPLANT
NEEDLE HYPO 18GX1.5 SHARP (NEEDLE) ×2
NS IRRIG 1000ML POUR BTL (IV SOLUTION) ×2 IMPLANT
PACK TOTAL JOINT (CUSTOM PROCEDURE TRAY) ×2 IMPLANT
PEN SKIN MARKING BROAD (MISCELLANEOUS) ×2 IMPLANT
POSITIONER SURGICAL ARM (MISCELLANEOUS) ×2 IMPLANT
SET HNDPC FAN SPRY TIP SCT (DISPOSABLE) ×1 IMPLANT
SET PAD KNEE POSITIONER (MISCELLANEOUS) ×2 IMPLANT
SPONGE GAUZE 2X2 STER 10/PKG (GAUZE/BANDAGES/DRESSINGS) ×1
SPONGE LAP 18X18 X RAY DECT (DISPOSABLE) IMPLANT
SPONGE SURGIFOAM ABS GEL 100 (HEMOSTASIS) ×2 IMPLANT
STOCKINETTE 6  STRL (DRAPES) ×1
STOCKINETTE 6 STRL (DRAPES) ×1 IMPLANT
SUCTION FRAZIER 12FR DISP (SUCTIONS) ×2 IMPLANT
SUT BONE WAX W31G (SUTURE) IMPLANT
SUT MNCRL AB 3-0 PS2 18 (SUTURE) ×3 IMPLANT
SUT VIC AB 1 CT1 27 (SUTURE) ×8
SUT VIC AB 1 CT1 27XBRD ANTBC (SUTURE) ×4 IMPLANT
SUT VIC AB 2-0 CT1 27 (SUTURE) ×4
SUT VIC AB 2-0 CT1 TAPERPNT 27 (SUTURE) ×2 IMPLANT
SUT VLOC 180 0 24IN GS25 (SUTURE) ×2 IMPLANT
SYR 50ML LL SCALE MARK (SYRINGE) ×2 IMPLANT
TAPE STRIPS DRAPE STRL (GAUZE/BANDAGES/DRESSINGS) ×2 IMPLANT
TOWEL OR 17X26 10 PK STRL BLUE (TOWEL DISPOSABLE) ×2 IMPLANT
TOWEL OR NON WOVEN STRL DISP B (DISPOSABLE) IMPLANT
TOWER CARTRIDGE SMART MIX (DISPOSABLE) ×2 IMPLANT
TRAY FOLEY W/METER SILVER 14FR (SET/KITS/TRAYS/PACK) ×2 IMPLANT
WATER STERILE IRR 1500ML POUR (IV SOLUTION) ×4 IMPLANT
WRAP KNEE MAXI GEL POST OP (GAUZE/BANDAGES/DRESSINGS) ×2 IMPLANT
YANKAUER SUCT BULB TIP NO VENT (SUCTIONS) ×2 IMPLANT

## 2014-12-18 NOTE — Anesthesia Procedure Notes (Signed)
Spinal  Start time: 12/18/2014 2:25 PM End time: 12/18/2014 2:30 PM Staffing Resident/CRNA: Sherian Maroon A Performed by: resident/CRNA  Preanesthetic Checklist Completed: patient identified, site marked, surgical consent, pre-op evaluation, timeout performed, IV checked, risks and benefits discussed and monitors and equipment checked Spinal Block Patient position: sitting Prep: Betadine Patient monitoring: heart rate, cardiac monitor, continuous pulse ox and blood pressure Approach: midline Location: L4-5 Needle Needle type: Spinocan  Needle gauge: 22 G Needle length: 9 cm Needle insertion depth: 4 cm Assessment Sensory level: T8

## 2014-12-18 NOTE — Op Note (Signed)
DATE OF SURGERY:  12/18/2014  TIME: 4:36 PM  PATIENT NAME:  [name]    AGE: 81 y.o.   PRE-OPERATIVE DIAGNOSIS:  RIGHT KNEE OA  POST-OPERATIVE DIAGNOSIS:  RIGHT KNEE OA  PROCEDURE:  Procedure(s): RIGHT TOTAL KNEE ARTHROPLASTY  SURGEON:  Prisca Gearing ANDREW  ASSISTANT:  Bryson Stilwell, PA-C, present and scrubbed throughout the case, critical for assistance with exposure, retraction, instrumentation, and closure.  OPERATIVE IMPLANTS: Depuy PFC Sigma Rotating Platform.  Femur size 4, Tibia size 4, Patella size 35 3-peg oval button, with a 10 mm polyethylene insert.   PREOPERATIVE INDICATIONS:   [name] is a 81 y.o. year old female with end stage bone on bone arthritis of the knee who failed conservative treatment and elected for Total Knee Arthroplasty.   The risks, benefits, and alternatives were discussed at length including but not limited to the risks of infection, bleeding, nerve injury, stiffness, blood clots, the need for revision surgery, cardiopulmonary complications, among others, and they were willing to proceed.  OPERATIVE DESCRIPTION:  The patient was brought to the operative room and placed in a supine position.  Spinal anesthesia was administered.  IV antibiotics were given.  The lower extremity was prepped and draped in the usual sterile fashion.  Time out was performed.  The leg was elevated and exsanguinated and the tourniquet was inflated.  Anterior quadriceps tendon splitting approach was performed.  The patella was retracted and osteophytes were removed.  The anterior horn of the medial and lateral meniscus was removed and cruciate ligaments resected.   The distal femur was opened with the drill and the intramedullary distal femoral cutting jig was utilized, set at 5 degrees resecting 10 mm off the distal femur.  Care was taken to protect the collateral ligaments.  The distal femoral sizing jig was applied, taking care to avoid notching.   Then the 4-in-1 cutting jig was applied and the anterior and posterior femur was cut, along with the chamfer cuts.    Then the extramedullary tibial cutting jig was utilized making the appropriate cut using the anterior tibial crest as a reference building in appropriate posterior slope.  Care was taken during the cut to protect the medial and collateral ligaments.  The proximal tibia was removed along with the posterior horns of the menisci.   The posterior medial femoral osteophytes and posterior lateral femoral osteophytes were removed.    The flexion gap was then measured and was symmetric with the extension gap, measured at 10.  I completed the distal femoral preparation using the appropriate jig to prepare the box.  The patella was then measured, and cut with the saw.    The proximal tibia sized and prepared accordingly with the reamer and the punch, and then all components were trialed with the trial insert.  The knee was found to have excellent balance and full motion.    The above named components were then cemented into place and all excess cement was removed.  The trial polyethylene component was in place during cementation, and then was exchanged for the real polyethylene component.    The knee was easily taken through a range of motion and the patella tracked well and the knee irrigated copiously and the parapatellar and subcutaneous tissue closed with vicryl, and monocryl with steri strips for the skin.  The arthrotomy was closed at 90 of flexion. The wounds were dressed with sterile gauze and the tourniquet released and the patient was awakened and returned to the PACU  in stable and satisfactory condition.  There were no complications.  Total tourniquet time was 90 minutes.

## 2014-12-18 NOTE — Anesthesia Postprocedure Evaluation (Signed)
  Anesthesia Post-op Note  Patient: [name]  Procedure(s) Performed: Procedure(s): RIGHT TOTAL KNEE ARTHROPLASTY (Right)  Patient Location: PACU  Anesthesia Type:MAC combined with regional for post-op pain  Level of Consciousness: awake  Airway and Oxygen Therapy: Patient Spontanous Breathing  Post-op Pain: none  Post-op Assessment: Post-op Vital signs reviewed, Patient's Cardiovascular Status Stable, Respiratory Function Stable, Patent Airway and No signs of Nausea or vomiting              Post-op Vital Signs: Reviewed and stable  Last Vitals:  Filed Vitals:   12/18/14 1104  BP: 112/52  Pulse: 75  Temp: 36.7 C  Resp: 16    Complications: No apparent anesthesia complications

## 2014-12-18 NOTE — Interval H&P Note (Signed)
History and Physical Interval Note:  12/18/2014 2:07 PM  [name]  has presented today for surgery, with the diagnosis of RIGHT KNEE OA  The various methods of treatment have been discussed with the patient and family. After consideration of risks, benefits and other options for treatment, the patient has consented to  Procedure(s): RIGHT TOTAL KNEE ARTHROPLASTY (Right) as a surgical intervention .  The patient's history has been reviewed, patient examined, no change in status, stable for surgery.  I have reviewed the patient's chart and labs.  Questions were answered to the patient's satisfaction.     Malayja Freund ANDREW

## 2014-12-18 NOTE — Progress Notes (Signed)
CBG 101 on arrival to OR holding 1225 report to [place] in holding.

## 2014-12-18 NOTE — Progress Notes (Addendum)
CBG 39 on arrival to unit at 1105. Hypoglycemic protocal initiated. CBG rechecked 15 min past 1/2 amp D50 IV.  Result was 96at 1135am. Repeated CBG at 1155am CBG was 64 repeated 1/2 amp Dextrose 50%.

## 2014-12-18 NOTE — Transfer of Care (Signed)
Immediate Anesthesia Transfer of Care Note  Patient: [name]  Procedure(s) Performed: Procedure(s): RIGHT TOTAL KNEE ARTHROPLASTY (Right)  Patient Location: PACU  Anesthesia Type:MAC and Spinal  Level of Consciousness: Patient easily awoken, sedated, comfortable, cooperative, following commands, responds to stimulation.   Airway & Oxygen Therapy: Patient spontaneously breathing, ventilating well, oxygen via simple oxygen mask.  Post-op Assessment: Report given to PACU RN, vital signs reviewed and stable, moving all extremities.   Post vital signs: Reviewed and stable.  Complications: No apparent anesthesia complications

## 2014-12-18 NOTE — Progress Notes (Signed)
Utilization review completed.  

## 2014-12-19 LAB — BASIC METABOLIC PANEL
Anion gap: 7 (ref 5–15)
BUN: 28 mg/dL — AB (ref 6–20)
CALCIUM: 8.1 mg/dL — AB (ref 8.9–10.3)
CHLORIDE: 101 mmol/L (ref 101–111)
CO2: 26 mmol/L (ref 22–32)
CREATININE: 1.79 mg/dL — AB (ref 0.44–1.00)
GFR calc Af Amer: 30 mL/min — ABNORMAL LOW (ref >60)
GFR calc non Af Amer: 26 mL/min — ABNORMAL LOW (ref >60)
Glucose, Bld: 463 mg/dL — ABNORMAL HIGH (ref 65–99)
Potassium: 5.3 mmol/L — ABNORMAL HIGH (ref 3.5–5.1)
SODIUM: 134 mmol/L — AB (ref 135–145)

## 2014-12-19 LAB — CBC
HEMATOCRIT: 33 % — AB (ref 36.0–46.0)
HEMOGLOBIN: 10.3 g/dL — AB (ref 12.0–15.0)
MCH: 29.4 pg (ref 26.0–34.0)
MCHC: 31.2 g/dL (ref 30.0–36.0)
MCV: 94.3 fL (ref 78.0–100.0)
Platelets: 215 10*3/uL (ref 150–400)
RBC: 3.5 MIL/uL — ABNORMAL LOW (ref 3.87–5.11)
RDW: 13.5 % (ref 11.5–15.5)
WBC: 9.8 10*3/uL (ref 4.0–10.5)

## 2014-12-19 LAB — GLUCOSE, CAPILLARY
GLUCOSE-CAPILLARY: 405 mg/dL — AB (ref 65–99)
GLUCOSE-CAPILLARY: 269 mg/dL — AB (ref 65–99)
Glucose-Capillary: 333 mg/dL — ABNORMAL HIGH (ref 65–99)
Glucose-Capillary: 207 mg/dL — ABNORMAL HIGH (ref 65–99)
Glucose-Capillary: 310 mg/dL — ABNORMAL HIGH (ref 65–99)

## 2014-12-19 MED ORDER — DEXAMETHASONE 6 MG PO TABS
10.0000 mg | ORAL_TABLET | Freq: Once | ORAL | Status: AC
  Administered 2014-12-19: 10 mg via ORAL
  Filled 2014-12-19: qty 1

## 2014-12-19 MED ORDER — ENOXAPARIN SODIUM 30 MG/0.3ML ~~LOC~~ SOLN
30.0000 mg | SUBCUTANEOUS | Status: DC
  Administered 2014-12-20: 30 mg via SUBCUTANEOUS
  Filled 2014-12-19 (×2): qty 0.3

## 2014-12-19 NOTE — Progress Notes (Signed)
Physical Therapy Treatment Patient Details Name: [name] MRN: [id number] DOB: [birth year] Today's Date: 2015-01-09    History of Present Illness s/p R TKA ; PMHx: multiple back surgeries, bil RCR    PT Comments    Progressing, incr amb distance this pm  Follow Up Recommendations        Equipment Recommendations       Recommendations for Other Services       Precautions / Restrictions Precautions Precautions: Fall Required Braces or Orthoses: Knee Immobilizer - Right Knee Immobilizer - Right: Discontinue once straight leg raise with < 10 degree lag Restrictions Other Position/Activity Restrictions: WBAT    Mobility  Bed Mobility Overal bed mobility: Needs Assistance Bed Mobility: Supine to Sit;Sit to Supine     Supine to sit: Min assist Sit to supine: Min assist   General bed mobility comments: assist for RLE  Transfers Overall transfer level: Needs assistance Equipment used: Rolling walker (2 wheeled) Transfers: Sit to/from Stand Sit to Stand: Min assist         General transfer comment: cues for UE/LE placement and safety  Ambulation/Gait Ambulation/Gait assistance: Min assist Ambulation Distance (Feet): 45 Feet Assistive device: Rolling walker (2 wheeled) Gait Pattern/deviations: Step-to pattern;Antalgic;Trunk flexed     General Gait Details: cues for sequence, RW position and posture   Stairs            Wheelchair Mobility    Modified Rankin (Stroke Patients Only)       Balance                                    Cognition Arousal/Alertness: Awake/alert Behavior During Therapy: WFL for tasks assessed/performed Overall Cognitive Status: Within Functional Limits for tasks assessed                      Exercises      General Comments        Pertinent Vitals/Pain Pain Assessment: 0-10 Pain Score: 3  Pain Location: R knee Pain Descriptors / Indicators: Sore Pain Intervention(s): Limited  activity within patient's tolerance;Monitored during session;Ice applied    Home Living                      Prior Function            PT Goals (current goals can now be found in the care plan section) Acute Rehab PT Goals Patient Stated Goal: go to rehab and regain independence    Frequency       PT Plan      Co-evaluation             End of Session           Time: [id number] PT Time Calculation (min) (ACUTE ONLY): 24 min  Charges:                       G CodesKenyon Ana 2015/01/09, 3:03 PM

## 2014-12-19 NOTE — Clinical Social Work Note (Signed)
Clinical Social Work Assessment  Patient Details  Name: [name] MRN: [id number] Date of Birth: 01/17/1934  Date of referral:  12/19/14               Reason for consult:  Discharge Planning                Permission sought to share information with:  Family Supports Permission granted to share information::  Yes, Verbal Permission Granted  Name::     Chiropodist::     Relationship::  spouse  Contact Information:     Housing/Transportation Living arrangements for the past 2 months:  Single Family Home Source of Information:  Patient Patient Interpreter Needed:  None Criminal Activity/Legal Involvement Pertinent to Current Situation/Hospitalization:  No - Comment as needed Significant Relationships:  Spouse, Adult Children Lives with:  Spouse Do you feel safe going back to the place where you live?  No Need for family participation in patient care:  No (Coment)  Care giving concerns:  Pt reports surgery was planned and that she was anticipating to go to SNF at [place].   Social Worker assessment / plan:  CSW met with patient at bedside and introduced myself and explained role. Pt reports that she had come to the hospital for planned knee surgery. Pt lives at home with husband and reports that family is involved. Pt reports she has not worked with PT/OT at this time but had planned for SNF placement due to needing rehab at [place]. Pt reports she is pre-signed at [facility] and wants to go there when she is medically stable. CSW explained SNF process and offered to call family. Pt reports that husband is not feeling well so there is no need to call him because she understands everything going on.  CSW completed FL2 and faxed information to [facility]. CSW spoke with DON Jeani Hawking) who will review information as well.  CSW will continue to follow and will assist with [place] when patient is medically stable. Pt prefers for family to transport her if possible but understanding that  non-emergency ambulance may be needed.  Employment status:  Retired Nurse, adult PT Recommendations:  Not assessed at this time Information / Referral to community resources:  [place]  Patient/Family's Response to care:  Patient alert and oriented. Patient aware and understanding and reports she wanted to be prepared prior to coming to the hospital.  Patient/Family's Understanding of and Emotional Response to Diagnosis, Current Treatment, and Prognosis:  Pt is hopeful for a quick recovery and plans to be motivated and engaged when working with PT/OT to achieve goals.   Emotional Assessment Appearance:  Appears stated age Attitude/Demeanor/Rapport:  Other (Cooperative) Affect (typically observed):  Accepting, Appropriate Orientation:  Oriented to Self, Oriented to Place, Oriented to  Time, Oriented to Situation Alcohol / Substance use:  Not Applicable Psych involvement (Current and /or in the community):  No (Comment)  Discharge Needs  Concerns to be addressed:  No discharge needs identified Readmission within the last 30 days:  No Current discharge risk:  None Barriers to Discharge:  No Barriers Identified   Boone Master, [place] 12/19/2014, 9:20 AM  Weekend Coverage [phone]

## 2014-12-19 NOTE — Evaluation (Signed)
Occupational Therapy Evaluation Patient Details Name: [name] MRN: [id number] DOB: April 05, 1933 Today's Date: 12/19/2014    History of Present Illness s/p R TKA ; PMHx: multiple back surgeries, bil RCR   Clinical Impression   This 81 year old female was admitted for the above surgery.  She will benefit from skilled OT to increase safety and independence with adls.  Goals in acute are for min guard to min A levels. She currently needs min A for SPTs and up to mod A for LB adls.  Pt needs safety cues as she moves quickly    Follow Up Recommendations  SNF    Equipment Recommendations   (to be further assessed:  likely 3:1)    Recommendations for Other Services       Precautions / Restrictions Precautions Precautions: Fall Restrictions Weight Bearing Restrictions: No Other Position/Activity Restrictions: WBAT      Mobility Bed Mobility Overal bed mobility: Needs Assistance Bed Mobility: Supine to Sit     Supine to sit: Min assist     General bed mobility comments: assist for RLE  Transfers Overall transfer level: Needs assistance Equipment used: Rolling walker (2 wheeled) Transfers: Sit to/from [facility] Sit to Stand: Min assist Stand pivot transfers: Min assist       General transfer comment: cues for UE/LE placement, sequence and safety.  Pt tends to move quickly and needed assistance to control descent    Balance                                            ADL Overall ADL's : Needs assistance/impaired     Grooming: Wash/dry hands;Wash/dry face;Set up;Sitting   Upper Body Bathing: Set up;Sitting   Lower Body Bathing: Moderate assistance;Sit to/from stand   Upper Body Dressing : Set up;Sitting   Lower Body Dressing: Moderate assistance;Sit to/from stand   Toilet Transfer: Minimal assistance;Stand-pivot;BSC;RW   Toileting- Clothing Manipulation and Hygiene: Minimal assistance;Sit to/from stand          General ADL Comments: completed ADL.  Pt is very independent and needed cues for safety as she tends to move quickly.       Vision     Perception     Praxis      Pertinent Vitals/Pain Pain Assessment: 0-10 Pain Score: 5  Pain Location: R knee Pain Intervention(s): Limited activity within patient's tolerance;Monitored during session;Premedicated before session;Repositioned (removed ice)     Hand Dominance     Extremity/Trunk Assessment Upper Extremity Assessment Upper Extremity Assessment: Overall WFL for tasks assessed          Communication Communication Communication: No difficulties   Cognition Arousal/Alertness: Awake/alert Behavior During Therapy: WFL for tasks assessed/performed Overall Cognitive Status: Within Functional Limits for tasks assessed                     General Comments       Exercises       Shoulder Instructions      Home Living Family/patient expects to be discharged to:: Skilled nursing facility                                        Prior Functioning/Environment Level of Independence: Independent with assistive device(s)  Comments: amb with 4 wheeled RW prior to adm    OT Diagnosis: Generalized weakness;Acute pain   OT Problem List: Decreased knowledge of use of DME or AE;Decreased safety awareness;Decreased knowledge of precautions;Pain;Decreased strength   OT Treatment/Interventions: Self-care/ADL training;DME and/or AE instruction;Patient/family education    OT Goals(Current goals can be found in the care plan section) Acute Rehab OT Goals Patient Stated Goal: go to rehab and regain independence OT Goal Formulation: With patient Time For Goal Achievement: 12/26/14 Potential to Achieve Goals: Good ADL Goals Pt Will Perform Grooming: with supervision;standing Pt Will Perform Lower Body Bathing: with adaptive equipment;sit to/from stand;with min guard assist Pt Will Perform Lower Body  Dressing: with min assist;with adaptive equipment;sit to/from stand Pt Will Transfer to Toilet: with min guard assist;ambulating;bedside commode Pt Will Perform Toileting - Clothing Manipulation and hygiene: with min guard assist;sit to/from stand Additional ADL Goal #1: pt will not need safety cues during adls/toilet transfers  OT Frequency: Min 2X/week   Barriers to D/C:            Co-evaluation              End of Session    Activity Tolerance: Patient tolerated treatment well Patient left: in chair;with call bell/phone within reach   Time: 0838-0902 OT Time Calculation (min): 24 min Charges:  OT General Charges $OT Visit: 1 Procedure OT Evaluation $Initial OT Evaluation Tier I: 1 Procedure OT Treatments $Self Care/Home Management : 8-22 mins G-Codes:    Shelma Eiben 01/18/15, 10:36 AM  Lesle Chris, OTR/L [phone] 01-18-2015

## 2014-12-19 NOTE — Evaluation (Signed)
Physical Therapy Evaluation Patient Details Name: [name] MRN: [id number] DOB: 1934/01/15 Today's Date: 12/19/2014   History of Present Illness  s/p R TKA ; PMHx: multiple back surgeries, bil RCR  Clinical Impression  Pt admitted with above diagnosis. Pt currently with functional limitations due to the deficits listed below (see PT Problem List).  Pt will benefit from skilled PT to increase their independence and safety with mobility to allow discharge to the venue listed below.  recommend SNF, pt planning for [facility]     Follow Up Recommendations SNF    Equipment Recommendations  None recommended by PT    Recommendations for Other Services       Precautions / Restrictions Precautions Precautions: Fall Restrictions Weight Bearing Restrictions: No Other Position/Activity Restrictions: WBAT      Mobility  Bed Mobility Overal bed mobility: Needs Assistance Bed Mobility: Supine to Sit     Supine to sit: Min assist     General bed mobility comments: assist for RLE  Transfers Overall transfer level: Needs assistance Equipment used: Rolling walker (2 wheeled) Transfers: Sit to/from Stand Sit to Stand: Min assist Stand pivot transfers: Min assist       General transfer comment: cues for UE/LE placement and safety  Ambulation/Gait Ambulation/Gait assistance: Min assist;Min guard Ambulation Distance (Feet): 35 Feet Assistive device: Rolling walker (2 wheeled) Gait Pattern/deviations: Antalgic;Step-to pattern;Decreased step length - right;Decreased step length - left   Gait velocity interpretation: Below normal speed for age/gender General Gait Details: cues for sequence, RW position  Stairs            Wheelchair Mobility    Modified Rankin (Stroke Patients Only)       Balance                                             Pertinent Vitals/Pain Pain Assessment: 0-10 Pain Score: 5  Pain Location: R knee Pain  Intervention(s): Limited activity within patient's tolerance;Monitored during session;Premedicated before session;Repositioned (removed ice)    Home Living Family/patient expects to be discharged to:: Skilled nursing facility                      Prior Function Level of Independence: Independent with assistive device(s)         Comments: amb with 4 wheeled RW prior to adm     Hand Dominance        Extremity/Trunk Assessment   Upper Extremity Assessment: Overall WFL for tasks assessed           Lower Extremity Assessment: RLE deficits/detail RLE Deficits / Details: ankle WFL; knee extension and hip flexion 2-/5, limited by pain       Communication   Communication: No difficulties  Cognition Arousal/Alertness: Awake/alert Behavior During Therapy: WFL for tasks assessed/performed Overall Cognitive Status: Within Functional Limits for tasks assessed                      General Comments      Exercises Total Joint Exercises Ankle Circles/Pumps: AROM;10 reps;Both      Assessment/Plan    PT Assessment Patient needs continued PT services  PT Diagnosis Difficulty walking   PT Problem List Decreased mobility;Decreased range of motion;Decreased strength;Pain;Decreased knowledge of use of DME  PT Treatment Interventions DME instruction;Gait training;Functional mobility training;Therapeutic activities;Therapeutic exercise;Patient/family education   PT  Goals (Current goals can be found in the Care Plan section) Acute Rehab PT Goals Patient Stated Goal: go to rehab and regain independence PT Goal Formulation: With patient Time For Goal Achievement: 12/24/14 Potential to Achieve Goals: Good    Frequency 7X/week   Barriers to discharge        Co-evaluation               End of Session Equipment Utilized During Treatment: Gait belt;Right knee immobilizer Activity Tolerance: Patient tolerated treatment well Patient left: with call bell/phone  within reach;in chair           Time: [id number] PT Time Calculation (min) (ACUTE ONLY): 31 min   Charges:   PT Evaluation $Initial PT Evaluation Tier I: 1 Procedure PT Treatments $Gait Training: 8-22 mins   PT G Codes:        Erez Mccallum 2015-01-07, 10:53 AM

## 2014-12-19 NOTE — Progress Notes (Addendum)
PHARMACIST - PHYSICIAN COMMUNICATION DR:  Theda Sers CONCERNING:  METFORMIN SAFE ADMINISTRATION POLICY  RECOMMENDATION: Metformin has been placed on DISCONTINUE (rejected order) STATUS and should be reordered only after any of the conditions below are ruled out.  Current safety recommendations include avoiding metformin for a minimum of 48 hours after the patient's exposure to intravenous contrast media.  DESCRIPTION:  The Pharmacy Committee has adopted a policy that restricts the use of metformin in hospitalized patients until all the following conditions have been ruled out.  Specific contraindications are:  [x]  eGFR is below 30 ml/minute []  Planned administration of intravenous iodinated contrast media []  Acute or chronic metabolic acidosis (including DKA)  []  Shock, acute MI, sepsis, hypoxemia, dehydration []  Heart Failure patients with low EF   Spoke with Mechele Claude PA-C to change enoxaparin from q12h to 30mg  q24h for CrCL < 86ml/min.  Orders to hold ACE-I (benazepril) and get BMP in am.        Clovis Riley, [facility] 12/19/2014 2:13 PM

## 2014-12-19 NOTE — Clinical Social Work Placement (Signed)
   CLINICAL SOCIAL WORK PLACEMENT  NOTE  Date:  12/19/2014  Patient Details  Name: [name] MRN: [id number] Date of Birth: Dec 16, 1933  Clinical Social Work is seeking post-discharge placement for this patient at the [place] level of care (*CSW will initial, date and re-position this form in  chart as items are completed):  Yes   Patient/family provided with [place] Work Department's list of facilities offering this level of care within the geographic area requested by the patient (or if unable, by the patient's family).  Yes   Patient/family informed of their freedom to choose among providers that offer the needed level of care, that participate in Medicare, Medicaid or managed care program needed by the patient, have an available bed and are willing to accept the patient.  Yes   Patient/family informed of [facility]'s ownership interest in [facility] and [facility], as well as of the fact that they are under no obligation to receive care at these facilities.  PASRR submitted to EDS on       PASRR number received on       Existing PASRR number confirmed on 12/19/14     FL2 transmitted to all facilities in geographic area requested by pt/family on 12/19/14     FL2 transmitted to all facilities within larger geographic area on       Patient informed that his/her managed care company has contracts with or will negotiate with certain facilities, including the following:            Patient/family informed of bed offers received.  Patient chooses bed at       Physician recommends and patient chooses bed at      Patient to be transferred to   on  .  Patient to be transferred to facility by       Patient family notified on   of transfer.  Name of family member notified:        PHYSICIAN       Additional Comment:    _______________________________________________ Boone Master, [place] 12/19/2014, 9:24 AM

## 2014-12-19 NOTE — Progress Notes (Signed)
12/13/14 0600 Nursing [name] called reg cbg of 405 this am at 5 am. Oral diabetes meds to restart at 8am.  Will recheck cbg at breakfast. No orders recieved

## 2014-12-19 NOTE — Progress Notes (Signed)
Subjective: 1 Day Post-Op Procedure(s) (LRB): RIGHT TOTAL KNEE ARTHROPLASTY (Right)  Patient reports pain as mild to moderate. Tolerating POs well.  Denies BM, but admits to flatulence.  Patient is in good spirits this am.  Notes that she rested well last night. Denies fever, chills, N/V.   Objective:   VITALS:  Temp:  [97.8 F (36.6 C)-98.7 F (37.1 C)] 97.8 F (36.6 C) (10/22 0742) Pulse Rate:  [64-80] 64 (10/22 0742) Resp:  [9-17] 12 (10/22 0742) BP: (102-131)/(42-58) 126/44 mmHg (10/22 0742) SpO2:  [91 %-100 %] 93 % (10/22 0742) Weight:  [81.647 kg (180 lb)] 81.647 kg (180 lb) (10/21 1805)  General: WDWN patient in NAD. Psych:  Appropriate mood and affect. Neuro:  A&O x 3, Moving all extremities, sensation intact to light touch HEENT:  EOMs intact Chest:  Even non-labored respirations Skin:  Dressing C/D/I, no rashes or lesions Extremities: warm/dry, mild edema, no erythema or echymosis.  No lymphadenopathy. Pulses: Popliteus 2+ MSK:  ROM: -10 degrees to TKE, MMT: patient can perform quad set, (-) Homan's.  Drained pulled.  Mild oozing of bright red blood.  No purulence or malodorous aroma.  Sterile 4x4s and ace bandage applied.    LABS  Recent Labs  12/19/14 0455  HGB 10.3*  WBC 9.8  PLT 215    Recent Labs  12/19/14 0455  NA 134*  K 5.3*  CL 101  CO2 26  BUN 28*  CREATININE 1.79*  GLUCOSE 463*   No results for input(s): LABPT, INR in the last 72 hours.   Assessment/Plan: 1 Day Post-Op Procedure(s) (LRB): RIGHT TOTAL KNEE ARTHROPLASTY (Right)  Up with therapy D/C IV fluids  Drain pulled, sterile 4x4s applied. WBAT RLE.   Plan for D/C tomorrow  Mechele Claude, PA-C, [place] Office:  [phone]

## 2014-12-20 LAB — CBC
HEMATOCRIT: 31.5 % — AB (ref 36.0–46.0)
Hemoglobin: 10.2 g/dL — ABNORMAL LOW (ref 12.0–15.0)
MCH: 30 pg (ref 26.0–34.0)
MCHC: 32.4 g/dL (ref 30.0–36.0)
MCV: 92.6 fL (ref 78.0–100.0)
PLATELETS: 205 10*3/uL (ref 150–400)
RBC: 3.4 MIL/uL — AB (ref 3.87–5.11)
RDW: 13.3 % (ref 11.5–15.5)
WBC: 13.5 10*3/uL — AB (ref 4.0–10.5)

## 2014-12-20 LAB — GLUCOSE, CAPILLARY
GLUCOSE-CAPILLARY: 214 mg/dL — AB (ref 65–99)
GLUCOSE-CAPILLARY: 159 mg/dL — AB (ref 65–99)
GLUCOSE-CAPILLARY: 242 mg/dL — AB (ref 65–99)
GLUCOSE-CAPILLARY: 207 mg/dL — AB (ref 65–99)

## 2014-12-20 LAB — BASIC METABOLIC PANEL
ANION GAP: 8 (ref 5–15)
BUN: 30 mg/dL — ABNORMAL HIGH (ref 6–20)
CALCIUM: 9 mg/dL (ref 8.9–10.3)
CO2: 25 mmol/L (ref 22–32)
CREATININE: 1.18 mg/dL — AB (ref 0.44–1.00)
Chloride: 101 mmol/L (ref 101–111)
GFR, EST AFRICAN AMERICAN: 50 mL/min — AB (ref >60)
GFR, EST NON AFRICAN AMERICAN: 43 mL/min — AB (ref >60)
Glucose, Bld: 278 mg/dL — ABNORMAL HIGH (ref 65–99)
Potassium: 5 mmol/L (ref 3.5–5.1)
SODIUM: 134 mmol/L — AB (ref 135–145)

## 2014-12-20 MED ORDER — INSULIN ASPART 100 UNIT/ML ~~LOC~~ SOLN
0.0000 [IU] | Freq: Three times a day (TID) | SUBCUTANEOUS | Status: DC
  Administered 2014-12-20: 4 [IU] via SUBCUTANEOUS
  Administered 2014-12-20: 7 [IU] via SUBCUTANEOUS
  Administered 2014-12-21: 4 [IU] via SUBCUTANEOUS
  Administered 2014-12-21: 7 [IU] via SUBCUTANEOUS

## 2014-12-20 MED ORDER — POLYETHYLENE GLYCOL 3350 17 G PO PACK: 17.0000 g | PACK | Freq: Once | ORAL | Status: DC

## 2014-12-20 MED ORDER — METHOCARBAMOL 500 MG PO TABS: 500.0000 mg | ORAL_TABLET | Freq: Three times a day (TID) | ORAL | Status: DC | PRN

## 2014-12-20 MED ORDER — ASPIRIN EC 325 MG PO TBEC: 325.0000 mg | DELAYED_RELEASE_TABLET | Freq: Two times a day (BID) | ORAL | Status: DC

## 2014-12-20 MED ORDER — ENOXAPARIN SODIUM 30 MG/0.3ML ~~LOC~~ SOLN
30.0000 mg | Freq: Two times a day (BID) | SUBCUTANEOUS | Status: DC
  Administered 2014-12-20 – 2014-12-21 (×2): 30 mg via SUBCUTANEOUS
  Filled 2014-12-20 (×4): qty 0.3

## 2014-12-20 MED ORDER — INSULIN ASPART 100 UNIT/ML ~~LOC~~ SOLN
0.0000 [IU] | Freq: Every day | SUBCUTANEOUS | Status: DC
  Administered 2014-12-20: 2 [IU] via SUBCUTANEOUS

## 2014-12-20 MED ORDER — OXYCODONE-ACETAMINOPHEN 5-325 MG PO TABS: 1.0000 | ORAL_TABLET | ORAL | Status: DC | PRN

## 2014-12-20 MED ORDER — FERROUS SULFATE 325 (65 FE) MG PO TABS: 325.0000 mg | ORAL_TABLET | Freq: Three times a day (TID) | ORAL | Status: DC

## 2014-12-20 NOTE — Care Management Note (Signed)
Case Management Note  Patient Details  Name: [name] MRN: [id number] Date of Birth: December 12, 1933  Subjective/Objective:    Right Total Knee Arthroplasty                 Action/Plan: NCM spoke to pt and plan is dc to [facility] on Monday. Pt states she lives in home with husband and his assistance is limited in the home. Dtr works full-time. [facility] Go is [facility] agency scheduled for [facility]. Will notify pt is dc to SNF. CSW referral for SNF placement.   Expected Discharge Date:                  Expected Discharge Plan:  [place]  In-House Referral:  Clinical Social Work  Discharge planning Services  CM Consult   Status of Service:  Completed, signed off  Medicare Important Message Given:    Date Medicare IM Given:    Medicare IM give by:    Date Additional Medicare IM Given:    Additional Medicare Important Message give by:     If discussed at [place] of Stay Meetings, dates discussed:    Additional Comments:  Erenest Rasher, RN 12/20/2014, 10:27 AM

## 2014-12-20 NOTE — Progress Notes (Signed)
   12/20/14 1500  PT Visit Information  Last PT Received On 12/20/14  Assistance Needed +1  History of Present Illness s/p R TKA ; PMHx: multiple back surgeries, bil RCR  PT Time Calculation  PT Start Time (ACUTE ONLY) 1515  PT Stop Time (ACUTE ONLY) 1531  PT Time Calculation (min) (ACUTE ONLY) 16 min  Subjective Data  Subjective I need a pain pill  Patient Stated Goal go to rehab and regain independence  Precautions  Precautions Fall  Required Braces or Orthoses Knee Immobilizer - Right  Knee Immobilizer - Right Discontinue once straight leg raise with < 10 degree lag  Restrictions  Other Position/Activity Restrictions WBAT  Pain Assessment  Pain Assessment 0-10  Pain Score 7  Pain Location R knee  Pain Descriptors / Indicators Constant;Sore  Pain Intervention(s) Ice applied;Limited activity within patient's tolerance;Monitored during session;Premedicated before session  Cognition  Arousal/Alertness Awake/alert  Behavior During Therapy WFL for tasks assessed/performed  Overall Cognitive Status Within Functional Limits for tasks assessed  Bed Mobility  Overal bed mobility Needs Assistance  Bed Mobility Supine to Sit;Sit to Supine  Supine to sit Min assist;Mod assist  Sit to supine Min assist  General bed mobility comments assist for RLE, cues for technique, requiring incr assist this pm  to sit  Transfers  Overall transfer level Needs assistance  Equipment used Rolling walker (2 wheeled)  Transfers Sit to/from Stand  Sit to Stand Min assist  General transfer comment cues for UE/LE placement and safety  Ambulation/Gait  Ambulation/Gait assistance Min assist  Ambulation Distance (Feet) 60 Feet  Assistive device Rolling walker (2 wheeled)  Gait Pattern/deviations Step-to pattern;Trunk flexed;Decreased step length - right;Decreased step length - left  General Gait Details cues for sequence, RW position and posture  Total Joint Exercises  Ankle Circles/Pumps AROM;10  reps;Both  PT - End of Session  Equipment Utilized During Treatment Gait belt;Right knee immobilizer  Activity Tolerance Patient limited by fatigue;Patient tolerated treatment well  Patient left in bed;with call bell/phone within reach;with bed alarm set;with family/visitor present  Nurse Communication Mobility status  PT - Assessment/Plan  PT Plan Current plan remains appropriate  PT Frequency (ACUTE ONLY) 7X/week  Follow Up Recommendations SNF  PT equipment None recommended by PT  PT Goal Progression  Progress towards PT goals Progressing toward goals  Acute Rehab PT Goals  PT Goal Formulation With patient  Time For Goal Achievement 12/24/14  Potential to Achieve Goals Good  PT General Charges  $$ ACUTE PT VISIT 1 Procedure  PT Treatments  $Gait Training 8-22 mins

## 2014-12-20 NOTE — Progress Notes (Signed)
Pt exhibiting intermittent episodes of confusion; acute onset, easily reoriented; PA advised

## 2014-12-20 NOTE — Progress Notes (Signed)
Subjective: 2 Days Post-Op Procedure(s) (LRB): RIGHT TOTAL KNEE ARTHROPLASTY (Right) Patient reports pain as mild to right knee.  Progressing with PT. Tolerating PO's well. Positive flatus, but no BM. Denies SOB, CP, or calf pain. No F/c.   Objective: Vital signs in last 24 hours: Temp:  [98.6 F (37 C)-100.2 F (37.9 C)] 98.6 F (37 C) (10/23 0554) Pulse Rate:  [74-83] 83 (10/23 0554) Resp:  [18-20] 18 (10/23 0554) BP: (129-151)/(58-73) 151/73 mmHg (10/23 0554) SpO2:  [92 %-94 %] 92 % (10/23 0554)  Intake/Output from previous day: 10/22 0701 - 10/23 0700 In: 1080 [P.O.:1080] Out: 1370 [Urine:1350; Drains:20] Intake/Output this shift:     Recent Labs  12/19/14 0455 12/20/14 0448  HGB 10.3* 10.2*    Recent Labs  12/19/14 0455 12/20/14 0448  WBC 9.8 13.5*  RBC 3.50* 3.40*  HCT 33.0* 31.5*  PLT 215 205    Recent Labs  12/19/14 0455 12/20/14 0448  NA 134* 134*  K 5.3* 5.0  CL 101 101  CO2 26 25  BUN 28* 30*  CREATININE 1.79* 1.18*  GLUCOSE 463* 278*  CALCIUM 8.1* 9.0   No results for input(s): LABPT, INR in the last 72 hours.  Well nourished. Alert and oriented x3. RRR, Lungs clear, BS x4. Abdomen soft and non tender. Right Calf soft and non tender. Right knee dressing C/D/I. No DVT signs. Compartment soft. No signs of infection.  Right LE grossly neurovascular intact.  Assessment/Plan: 2 Days Post-Op Procedure(s) (LRB): RIGHT TOTAL KNEE ARTHROPLASTY (Right) Up with PT ICE right knee Plan to D/c to Rehab tomorrow Continue current care Lovenox inpatient status then ASA 325mg  bid at D/c. F/u monitor Creatine and BUN. F/u with PCP. Miliano Cotten L 12/20/2014, 9:02 AM

## 2014-12-20 NOTE — Progress Notes (Signed)
Physical Therapy Treatment Patient Details Name: [name] MRN: [id number] DOB: [birth year] Today's Date: 12/20/2014    History of Present Illness s/p R TKA ; PMHx: multiple back surgeries, bil RCR    PT Comments    Pt progressing well, planning for D/C to [facility] tomorrow  Follow Up Recommendations  SNF     Equipment Recommendations  None recommended by PT    Recommendations for Other Services       Precautions / Restrictions Precautions Precautions: Fall Required Braces or Orthoses: Knee Immobilizer - Right Knee Immobilizer - Right: Discontinue once straight leg raise with < 10 degree lag Restrictions Weight Bearing Restrictions: No Other Position/Activity Restrictions: WBAT    Mobility  Bed Mobility Overal bed mobility: Needs Assistance Bed Mobility: Supine to Sit     Supine to sit: Min assist     General bed mobility comments: assist for RLE  Transfers Overall transfer level: Needs assistance Equipment used: Rolling walker (2 wheeled) Transfers: Sit to/from Stand Sit to Stand: Supervision;Min guard         General transfer comment: cues for UE/LE placement and safety  Ambulation/Gait Ambulation/Gait assistance: Min guard;Min assist Ambulation Distance (Feet): 60 Feet Assistive device: Rolling walker (2 wheeled) Gait Pattern/deviations: Step-to pattern;Antalgic;Decreased step length - left;Decreased step length - right     General Gait Details: cues for sequence, RW position and posture   Stairs            Wheelchair Mobility    Modified Rankin (Stroke Patients Only)       Balance                                    Cognition Arousal/Alertness: Awake/alert Behavior During Therapy: WFL for tasks assessed/performed Overall Cognitive Status: Within Functional Limits for tasks assessed                      Exercises Total Joint Exercises Ankle Circles/Pumps: AROM;10 reps;Both Quad Sets:  Strengthening;Both;10 reps Short Arc Quad: AAROM;AROM;Right;10 reps Heel Slides: AAROM;Right;10 reps Hip ABduction/ADduction: AAROM;Right;10 reps Straight Leg Raises: AAROM;Right;10 reps    General Comments        Pertinent Vitals/Pain Pain Assessment: 0-10 Pain Score: 6  Pain Location: R knee (decr pain at rest) Pain Descriptors / Indicators: Sore;Aching Pain Intervention(s): Limited activity within patient's tolerance;Monitored during session;Premedicated before session;Ice applied    Home Living                      Prior Function            PT Goals (current goals can now be found in the care plan section) Acute Rehab PT Goals Patient Stated Goal: go to rehab and regain independence PT Goal Formulation: With patient Time For Goal Achievement: 12/24/14 Potential to Achieve Goals: Good Progress towards PT goals: Progressing toward goals    Frequency  7X/week    PT Plan Current plan remains appropriate    Co-evaluation             End of Session Equipment Utilized During Treatment: Gait belt;Right knee immobilizer Activity Tolerance: Patient tolerated treatment well Patient left: in chair;with call bell/phone within reach;with family/visitor present     Time: 1030-1056 PT Time Calculation (min) (ACUTE ONLY): 26 min  Charges:  $Gait Training: 8-22 mins $Therapeutic Exercise: 8-22 mins  G CodesKenyon Ana 12/20/2014, 11:39 AM

## 2014-12-21 ENCOUNTER — Encounter (HOSPITAL_COMMUNITY): Payer: Self-pay | Admitting: Specialist

## 2014-12-21 ENCOUNTER — Inpatient Hospital Stay
Admission: RE | Admit: 2014-12-21 | Discharge: 2015-01-08 | Disposition: A | Discharge status: Home / Self Care | Payer: Medicare Other | Source: Ambulatory Visit | Attending: Pulmonary Disease | Admitting: Pulmonary Disease

## 2014-12-21 LAB — BASIC METABOLIC PANEL
Anion gap: 7 (ref 5–15)
BUN: 21 mg/dL — ABNORMAL HIGH (ref 6–20)
CHLORIDE: 101 mmol/L (ref 101–111)
CO2: 26 mmol/L (ref 22–32)
CREATININE: 0.93 mg/dL (ref 0.44–1.00)
Calcium: 8.9 mg/dL (ref 8.9–10.3)
GFR calc non Af Amer: 57 mL/min — ABNORMAL LOW (ref >60)
GLUCOSE: 210 mg/dL — AB (ref 65–99)
Potassium: 4.4 mmol/L (ref 3.5–5.1)
Sodium: 134 mmol/L — ABNORMAL LOW (ref 135–145)

## 2014-12-21 LAB — GLUCOSE, CAPILLARY
GLUCOSE-CAPILLARY: 185 mg/dL — AB (ref 65–99)
GLUCOSE-CAPILLARY: 229 mg/dL — AB (ref 65–99)

## 2014-12-21 LAB — CBC
HEMATOCRIT: 33.1 % — AB (ref 36.0–46.0)
HEMOGLOBIN: 10.6 g/dL — AB (ref 12.0–15.0)
MCH: 29.9 pg (ref 26.0–34.0)
MCHC: 32 g/dL (ref 30.0–36.0)
MCV: 93.2 fL (ref 78.0–100.0)
Platelets: 183 10*3/uL (ref 150–400)
RBC: 3.55 MIL/uL — ABNORMAL LOW (ref 3.87–5.11)
RDW: 13.4 % (ref 11.5–15.5)
WBC: 10.6 10*3/uL — ABNORMAL HIGH (ref 4.0–10.5)

## 2014-12-21 NOTE — Progress Notes (Signed)
Physical Therapy Treatment Patient Details Name: [name] MRN: [id number] DOB: Apr 12, 1933 Today's Date: 12/21/2014    History of Present Illness s/p R TKA ; PMHx: multiple back surgeries, bil RCR    PT Comments    Patient is improving, plans for Sn[facility] today.  Follow Up Recommendations  SNF     Equipment Recommendations       Recommendations for Other Services       Precautions / Restrictions Precautions Precautions: Fall Required Braces or Orthoses: Knee Immobilizer - Right Knee Immobilizer - Right: Discontinue once straight leg raise with < 10 degree lag    Mobility  Bed Mobility                  Transfers   Equipment used: Rolling walker (2 wheeled) Transfers: Sit to/from Stand Sit to Stand: Min guard         General transfer comment: cues for UE/LE placement and safety, practice onto a tall surface  Ambulation/Gait Ambulation/Gait assistance: Min assist Ambulation Distance (Feet): 60 Feet Assistive device: Rolling walker (2 wheeled) Gait Pattern/deviations: Step-to pattern;Step-through pattern     General Gait Details: cues for sequence, RW position and posture   Stairs            Wheelchair Mobility    Modified Rankin (Stroke Patients Only)       Balance                                    Cognition                            Exercises      General Comments        Pertinent Vitals/Pain Pain Assessment: 0-10 Pain Score: 6  Pain Location: R knee Pain Descriptors / Indicators: Aching;Tender Pain Intervention(s): Monitored during session;Premedicated before session    Home Living                      Prior Function            PT Goals (current goals can now be found in the care plan section) Progress towards PT goals: Progressing toward goals    Frequency       PT Plan Current plan remains appropriate    Co-evaluation             End of Session Equipment  Utilized During Treatment:  (did not use KI) Activity Tolerance: Patient tolerated treatment well Patient left: in chair;with call bell/phone within reach;with family/visitor present     Time: 1204-1221 PT Time Calculation (min) (ACUTE ONLY): 17 min  Charges:  $Gait Training: 8-22 mins                    G Codes:      [name] 12/21/2014, 1:02 PM

## 2014-12-21 NOTE — Discharge Summary (Signed)
Physician Discharge Summary  Patient ID: [name] MRN: [id number] DOB/AGE: 1933-03-31 81 y.o.  Admit date: 12/18/2014 Discharge date: 12/21/2014  Admission Diagnoses: Right knee OA  Discharge Diagnoses:  Active Problems:   S/P knee replacement   Osteoarthritis of right knee   Discharged Condition:good  Hospital Course:  [name] is a 81 y.o. who was admitted to [facility] They were brought to the operating room on 12/18/2014 and underwent Procedure(s): RIGHT TOTAL KNEE ARTHROPLASTY.  Patient tolerated the procedure well and was later transferred to the recovery room and then to the orthopaedic floor for postoperative care.  They were given PO and IV analgesics for pain control following their surgery.  They were given 24 hours of postoperative antibiotics of  Anti-infectives    Start     Dose/Rate Route Frequency Ordered Stop   12/18/14 2000  ceFAZolin (ANCEF) IVPB 2 g/50 mL premix     2 g 100 mL/hr over 30 Minutes Intravenous Every 6 hours 12/18/14 1813 12/19/14 0150   12/18/14 1400  vancomycin (VANCOCIN) IVPB 1000 mg/200 mL premix     1,000 mg 200 mL/hr over 60 Minutes Intravenous  Once 12/18/14 1357 12/18/14 1514   12/18/14 1125  ceFAZolin (ANCEF) IVPB 2 g/50 mL premix  Status:  Discontinued     2 g 100 mL/hr over 30 Minutes Intravenous On call to O.R. 12/18/14 1125 12/18/14 1357     and started on DVT prophylaxis in the form of lovenox.   PT and OT were ordered for total joint protocol.  Discharge planning consulted to help with postop disposition and equipment needs.  Patient had a good night on the evening of surgery and started to get up OOB with therapy on day one.  Hemovac drain was pulled without difficulty.  Continued to work with therapy into day two.  Dressing was with normal limits.  The patient had progressed with therapy and meeting their goals. Patient was seen in rounds and was ready to go home.  Consults: n/a  Significant Diagnostic  Studies: routine  Treatments:routine  Discharge Exam: Blood pressure 152/78, pulse 82, temperature 98.1 F (36.7 C), temperature source Oral, resp. rate 16, height 5\' 2"  (1.575 m), weight 81.647 kg (180 lb), SpO2 92 %. Well nourished. Alert and oriented x3. RRR, Lungs clear, BS x4. Abdomen soft and non tender. Right Calf soft and non tender. Right knee dressing C/D/I. No DVT signs. Compartment soft. No signs of infection.  Right LE grossly neurovascular intact.  Disposition: 01-Home or Self Care  Discharge Instructions    Call MD / Call 911    Complete by:  As directed   If you experience chest pain or shortness of breath, CALL 911 and be transported to the hospital emergency room.  If you develope a fever above 101 F, pus (white drainage) or increased drainage or redness at the wound, or calf pain, call your surgeon's office.     Constipation Prevention    Complete by:  As directed   Drink plenty of fluids.  Prune juice may be helpful.  You may use a stool softener, such as Colace (over the counter) 100 mg twice a day.  Use MiraLax (over the counter) for constipation as needed.     Diet - low sodium heart healthy    Complete by:  As directed      Discharge instructions    Complete by:  As directed   INSTRUCTIONS AFTER JOINT REPLACEMENT   Remove items at  home which could result in a fall. This includes throw rugs or furniture in walking pathways ICE to the affected joint every three hours while awake for 30 minutes at a time, for at least the first 3-5 days, and then as needed for pain and swelling.  Continue to use ice for pain and swelling. You may notice swelling that will progress down to the foot and ankle.  This is normal after surgery.  Elevate your leg when you are not up walking on it.   Continue to use the breathing machine you got in the hospital (incentive spirometer) which will help keep your temperature down.  It is common for your temperature to cycle up and down following  surgery, especially at night when you are not up moving around and exerting yourself.  The breathing machine keeps your lungs expanded and your temperature down.   DIET:  As you were doing prior to hospitalization, we recommend a well-balanced diet.  DRESSING / WOUND CARE / SHOWERING  Keep the surgical dressing until follow up.  The dressing is water proof, so you can shower without any extra covering.  IF THE DRESSING FALLS OFF or the wound gets wet inside, change the dressing with sterile gauze.  Please use good hand washing techniques before changing the dressing.  Do not use any lotions or creams on the incision until instructed by your surgeon.    ACTIVITY  Increase activity slowly as tolerated, but follow the weight bearing instructions below.   No driving for 6 weeks or until further direction given by your physician.  You cannot drive while taking narcotics.  No lifting or carrying greater than 10 lbs. until further directed by your surgeon. Avoid periods of inactivity such as sitting longer than an hour when not asleep. This helps prevent blood clots.  You may return to work once you are authorized by your doctor.     WEIGHT BEARING   Weight bearing as tolerated with assist device (walker, cane, etc) as directed, use it as long as suggested by your surgeon or therapist, typically at least 4-6 weeks.   EXERCISES  Results after joint replacement surgery are often greatly improved when you follow the exercise, range of motion and muscle strengthening exercises prescribed by your doctor. Safety measures are also important to protect the joint from further injury. Any time any of these exercises cause you to have increased pain or swelling, decrease what you are doing until you are comfortable again and then slowly increase them. If you have problems or questions, call your caregiver or physical therapist for advice.   Rehabilitation is important following a joint replacement. After  just a few days of immobilization, the muscles of the leg can become weakened and shrink (atrophy).  These exercises are designed to build up the tone and strength of the thigh and leg muscles and to improve motion. Often times heat used for twenty to thirty minutes before working out will loosen up your tissues and help with improving the range of motion but do not use heat for the first two weeks following surgery (sometimes heat can increase post-operative swelling).   These exercises can be done on a training (exercise) mat, on the floor, on a table or on a bed. Use whatever works the best and is most comfortable for you.    Use music or television while you are exercising so that the exercises are a pleasant break in your day. This will make your life better with  the exercises acting as a break in your routine that you can look forward to.   Perform all exercises about fifteen times, three times per day or as directed.  You should exercise both the operative leg and the other leg as well.   Exercises include:   Quad Sets - Tighten up the muscle on the front of the thigh (Quad) and hold for 5-10 seconds.   Straight Leg Raises - With your knee straight (if you were given a brace, keep it on), lift the leg to 60 degrees, hold for 3 seconds, and slowly lower the leg.  Perform this exercise against resistance later as your leg gets stronger.  Leg Slides: Lying on your back, slowly slide your foot toward your buttocks, bending your knee up off the floor (only go as far as is comfortable). Then slowly slide your foot back down until your leg is flat on the floor again.  Angel Wings: Lying on your back spread your legs to the side as far apart as you can without causing discomfort.  Hamstring Strength:  Lying on your back, push your heel against the floor with your leg straight by tightening up the muscles of your buttocks.  Repeat, but this time bend your knee to a comfortable angle, and push your heel  against the floor.  You may put a pillow under the heel to make it more comfortable if necessary.   A rehabilitation program following joint replacement surgery can speed recovery and prevent re-injury in the future due to weakened muscles. Contact your doctor or a physical therapist for more information on knee rehabilitation.    CONSTIPATION  Constipation is defined medically as fewer than three stools per week and severe constipation as less than one stool per week.  Even if you have a regular bowel pattern at home, your normal regimen is likely to be disrupted due to multiple reasons following surgery.  Combination of anesthesia, postoperative narcotics, change in appetite and fluid intake all can affect your bowels.   YOU MUST use at least one of the following options; they are listed in order of increasing strength to get the job done.  They are all available over the counter, and you may need to use some, POSSIBLY even all of these options:    Drink plenty of fluids (prune juice may be helpful) and high fiber foods Colace 100 mg by mouth twice a day  Senokot for constipation as directed and as needed Dulcolax (bisacodyl), take with full glass of water  Miralax (polyethylene glycol) once or twice a day as needed.  If you have tried all these things and are unable to have a bowel movement in the first 3-4 days after surgery call either your surgeon or your primary doctor.    If you experience loose stools or diarrhea, hold the medications until you stool forms back up.  If your symptoms do not get better within 1 week or if they get worse, check with your doctor.  If you experience "the worst abdominal pain ever" or develop nausea or vomiting, please contact the office immediately for further recommendations for treatment.   ITCHING:  If you experience itching with your medications, try taking only a single pain pill, or even half a pain pill at a time.  You can also use Benadryl over the  counter for itching or also to help with sleep.   TED HOSE STOCKINGS:  Use stockings on both legs until for at least 2 weeks  or as directed by physician office. They may be removed at night for sleeping.  MEDICATIONS:  See your medication summary on the "After Visit Summary" that nursing will review with you.  You may have some home medications which will be placed on hold until you complete the course of blood thinner medication.  It is important for you to complete the blood thinner medication as prescribed.  PRECAUTIONS:  If you experience chest pain or shortness of breath - call 911 immediately for transfer to the hospital emergency department.   If you develop a fever greater that 101 F, purulent drainage from wound, increased redness or drainage from wound, foul odor from the wound/dressing, or calf pain - CONTACT YOUR SURGEON.                                                   FOLLOW-UP APPOINTMENTS:  If you do not already have a post-op appointment, please call the office for an appointment to be seen by your surgeon.  Guidelines for how soon to be seen are listed in your "After Visit Summary", but are typically between 1-4 weeks after surgery.  OTHER INSTRUCTIONS:   Knee Replacement:  Do not place pillow under knee, focus on keeping the knee straight while resting. CPM instructions: 0-90 degrees, 2 hours in the morning, 2 hours in the afternoon, and 2 hours in the evening. Place foam block, curve side up under heel at all times except when in CPM or when walking.  DO NOT modify, tear, cut, or change the foam block in any way.  MAKE SURE YOU:  Understand these instructions.  Get help right away if you are not doing well or get worse.    Thank you for letting us be a part of your medical care team.  It is a privilege we respect greatly.  We hope these instructions will help you stay on track for a fast and full recovery!     Increase activity slowly as tolerated    Complete by:  As  directed             Medication List    STOP taking these medications        aspirin 81 MG tablet  Replaced by:  aspirin EC 325 MG tablet     oxyCODONE 5 MG immediate release tablet  Commonly known as:  Oxy IR/ROXICODONE      TAKE these medications        amLODipine 10 MG tablet  Commonly known as:  NORVASC  Take 10 mg by mouth every morning.     anastrozole 1 MG tablet  Commonly known as:  ARIMIDEX  Take 1 mg by mouth daily.     aspirin EC 325 MG tablet  Take 1 tablet (325 mg total) by mouth 2 (two) times daily.     atorvastatin 10 MG tablet  Commonly known as:  LIPITOR  Take 10 mg by mouth daily.     benazepril 20 MG tablet  Commonly known as:  LOTENSIN  Take 20 mg by mouth every morning.     DULoxetine 60 MG capsule  Commonly known as:  CYMBALTA  Take 60 mg by mouth at bedtime.     ferrous sulfate 325 (65 FE) MG tablet  Take 1 tablet (325 mg total) by mouth 3 (three) times daily  after meals.     gabapentin 300 MG capsule  Commonly known as:  NEURONTIN  Take 300 mg by mouth 3 (three) times daily.     glyBURIDE-metformin 2.5-500 MG tablet  Commonly known as:  GLUCOVANCE  Take 1-2 tablets by mouth 2 (two) times daily with a meal. TAKES 2 IN THE MORNING AND 1 AT NIGHT     hydrALAZINE 25 MG tablet  Commonly known as:  APRESOLINE  Take 25 mg by mouth 3 (three) times daily.     insulin glargine 100 UNIT/ML injection  Commonly known as:  LANTUS  Inject 60 Units into the skin daily.     methocarbamol 500 MG tablet  Commonly known as:  ROBAXIN  Take 1 tablet (500 mg total) by mouth every 8 (eight) hours as needed for muscle spasms.     metoprolol succinate 100 MG 24 hr tablet  Commonly known as:  TOPROL-XL  Take 100 mg by mouth every morning. Take with or immediately following a meal.     omeprazole 20 MG capsule  Commonly known as:  PRILOSEC  Take 20 mg by mouth daily.     oxyCODONE-acetaminophen 5-325 MG tablet  Commonly known as:  ROXICET  Take  1-2 tablets by mouth every 4 (four) hours as needed for severe pain.     sitaGLIPtin 100 MG tablet  Commonly known as:  JANUVIA  Take 100 mg by mouth daily.         SignedLajean Manes 12/21/2014, 7:52 AM

## 2014-12-21 NOTE — Clinical Social Work Placement (Signed)
   CLINICAL SOCIAL WORK PLACEMENT  NOTE  Date:  12/21/2014  Patient Details  Name: [name] MRN: [id number] Date of Birth: 1933/09/05  Clinical Social Work is seeking post-discharge placement for this patient at the [place] level of care (*CSW will initial, date and re-position this form in  chart as items are completed):  Yes   Patient/family provided with [place] Work Department's list of facilities offering this level of care within the geographic area requested by the patient (or if unable, by the patient's family).  Yes   Patient/family informed of their freedom to choose among providers that offer the needed level of care, that participate in Medicare, Medicaid or managed care program needed by the patient, have an available bed and are willing to accept the patient.  Yes   Patient/family informed of [place]'s ownership interest in [facility] and [facility], as well as of the fact that they are under no obligation to receive care at these facilities.  PASRR submitted to EDS on       PASRR number received on       Existing PASRR number confirmed on 12/19/14     FL2 transmitted to all facilities in geographic area requested by pt/family on 12/19/14     FL2 transmitted to all facilities within larger geographic area on       Patient informed that his/her managed care company has contracts with or will negotiate with certain facilities, including the following:        Yes   Patient/family informed of bed offers received.  Patient chooses bed at [facility]     Physician recommends and patient chooses bed at      Patient to be transferred to [facility] on 12/21/14.  Patient to be transferred to facility by [place]     Patient family notified on 12/21/14 of transfer.  Name of family member notified:  DAUGHTER     PHYSICIAN       Additional Comment: Pt / daughter are in agreement with dc to [facility]  today. PT approved transport by car. NSG reviewed d/c summary, scripts, avs. Scripts included in d/c packet. DC Summary sent to SNF for review prior to dc. DC packet provided to pt prior to DC.   _______________________________________________ Luretha Rued, [place] 12/21/2014, 2:12 PM

## 2014-12-21 NOTE — Care Management Note (Signed)
Case Management Note  Patient Details  Name: [name] MRN: [id number] Date of Birth: 09-03-1933  Subjective/Objective:     S/p Total Knee Arthroplasty               Action/Plan: Discharge planning per CSW  Expected Discharge Date:                  Expected Discharge Plan:  [place]  In-House Referral:  Clinical Social Work  Discharge planning Services  CM Consult  Post Acute Care Choice:  NA Choice offered to:  NA  DME Arranged:  N/A DME Agency:  NA  HH Arranged:  NA HH Agency:  NA  Status of Service:  Completed, signed off  Medicare Important Message Given:  Yes-second notification given Date Medicare IM Given:    Medicare IM give by:    Date Additional Medicare IM Given:    Additional Medicare Important Message give by:     If discussed at [place] of Stay Meetings, dates discussed:    Additional Comments:  Guadalupe Maple, RN 12/21/2014, 1:24 PM

## 2014-12-21 NOTE — Care Management Important Message (Signed)
Important Message  Patient Details  Name: MATEO RAISBECK MRN: [id number] Date of Birth: Mar 22, 1933   Medicare Important Message Given:  Yes-second notification given    Camillo Flaming 12/21/2014, 12:43 [place] Message  Patient Details  Name: SHERNICE MIZERAK MRN: [id number] Date of Birth: Dec 20, 1933   Medicare Important Message Given:  Yes-second notification given    Camillo Flaming 12/21/2014, 12:43 PM

## 2014-12-21 NOTE — Progress Notes (Signed)
Pt to d/c to [facility]. Attempted to give report to the nurse but I got no answer when transferred to the [employer] nurses station. Patient was transferred by car with daughter. D/c packet given to patient and daughter.

## 2014-12-21 NOTE — Progress Notes (Signed)
Subjective: 3 Days Post-Op Procedure(s) (LRB): RIGHT TOTAL KNEE ARTHROPLASTY (Right) Patient reports pain as mild to right knee.  Confusion has improved. Tolerating PO's. Positive BM. Progressing with PT. Denies CP, SOB, or calf pain. No F/c.  Objective: Vital signs in last 24 hours: Temp:  [98.1 F (36.7 C)-99.8 F (37.7 C)] 98.1 F (36.7 C) (10/24 0521) Pulse Rate:  [80-86] 82 (10/24 0521) Resp:  [16-18] 16 (10/24 0521) BP: (152-153)/(65-78) 152/78 mmHg (10/24 0654) SpO2:  [92 %-93 %] 92 % (10/24 0521)  Intake/Output from previous day: 10/23 0701 - 10/24 0700 In: -  Out: 750 [Urine:750] Intake/Output this shift:     Recent Labs  12/19/14 0455 12/20/14 0448  HGB 10.3* 10.2*    Recent Labs  12/19/14 0455 12/20/14 0448  WBC 9.8 13.5*  RBC 3.50* 3.40*  HCT 33.0* 31.5*  PLT 215 205    Recent Labs  12/19/14 0455 12/20/14 0448  NA 134* 134*  K 5.3* 5.0  CL 101 101  CO2 26 25  BUN 28* 30*  CREATININE 1.79* 1.18*  GLUCOSE 463* 278*  CALCIUM 8.1* 9.0   No results for input(s): LABPT, INR in the last 72 hours.  Well nourished. Alert and oriented x3. RRR, Lungs clear, BS x4. Abdomen soft and non tender. Right Calf soft and non tender. Right knee dressing C/D/I. No DVT signs. Compartment soft. No signs of infection.  Right LE grossly neurovascular intact.  Assessment/Plan: 3 Days Post-Op Procedure(s) (LRB): RIGHT TOTAL KNEE ARTHROPLASTY (Right) D/c to [facility] today Up with PT Follow instructions F/u in office in 2 weeks with Dr. Theda Sers F/u with Dr. Lenna Gilford and recheck labs Family and patient updated and questions answered in detail  Kaimen Peine L 12/21/2014, 7:55 AM

## 2015-01-04 NOTE — H&P (Signed)
[name] MRN: [id number] DOB/AGE: 1933-04-26 81 y.o. Primary Care Physician:Kylin Genna L, MD Admit date: 12/21/2014 Chief Complaint: Rehabilitation from knee replacement HPI: This is documentation of the history and physical I performed on 12/27/2014 at the skilled care facility. She is in the skilled care facility for rehabilitation after right knee replacement. She's had some trouble with bleeding along the incision and has been back to her orthopedist. She still has some bleeding. She says otherwise she is doing well. She has multiple other medical problems including hypertension diabetes cirrhosis of the liver which is from [place] history of breast cancer chronic low back pain. She says her pain is controlled  Past Medical History  Diagnosis Date  . Diabetes mellitus   . Arthritis   . Hyperlipidemia   . Mental disorder   . Chronic kidney disease     renal stones, one episode of Lithotripsy  . GERD (gastroesophageal reflux disease)   . Skin cancer     face- melanoma, 2012- surg. excision    . Breast cancer, left ([place])     er/pr +  . Hypertension     followed by [facility]. relative to  urology study grp.  Doesn't report ever having a stress test   . Complication of anesthesia   . PONV (postoperative nausea and vomiting)   . Cataract     no surgery as yet,starting of 3 years  . Seasonal allergies   . Cirrhosis (HCC)     Metavir score 4  . Nocturia   . Anemia   . Multiple falls   . Abrasion of arm, left     secondary to fall    Past Surgical History  Procedure Laterality Date  . Cholecystectomy    . Abdominal hysterectomy    . Skin biopsy    . Shoulder surgery    . Tubal ligation    . Fracture surgery      1975- ORIF- L ankle   . Breast lumpectomy with needle localization and axillary sentinel lymph node bx  03/12/2012    Procedure: BREAST LUMPECTOMY WITH NEEDLE LOCALIZATION AND AXILLARY SENTINEL LYMPH NODE BX;  Surgeon: Merrie Roof, MD;  Location: [facility];   Service: General;  Laterality: Left;  . Re-excision of breast lumpectomy  03/22/2012    Procedure: RE-EXCISION OF BREAST LUMPECTOMY;  Surgeon: Merrie Roof, MD;  Location: [facility];  Service: General;  Laterality: Left;  re-excision left breast lumpectomy cavity  ER+, PR+  . Rotator cuff repair Bilateral   . Colonoscopy N/A 11/20/2013    Dr.Rourk- diverticula was found in the left colon. o/w normal rectal, colonic and terminal ileal mucosa  . Esophagogastroduodenoscopy N/A 11/20/2013    Dr.Rourk- abnormal mucosa was found. diffuse snake skinning and friability of the gastric mucosa. patent pylorus. abnormal-appearing first, second and third portion of the duodenum. 18mm gastric nodule at the GE junction. stomach bx= minimal chronic infammation, GE junction bx= polypoid fragments of gastric cardia type mucosa w/mod chronic inflammation and foveolar hyperplasia  . Givens capsule study N/A 02/24/2014    Procedure: GIVENS CAPSULE STUDY;  Surgeon: Daneil Dolin, MD;  [facility];  Service: Endoscopy;  Laterality: N/A;  . Breast surgery      lumpectomy x2/left   . Eye surgery      cataract surgery bilat   . Back surgery      x4 back surgery to include rods & screws  . Last in May 2014  .  Total knee arthroplasty Right 12/18/2014    Procedure: RIGHT TOTAL KNEE ARTHROPLASTY;  Surgeon: Sydnee Cabal, MD;  [facility];  Service: Orthopedics;  Laterality: Right;        Family History  Problem Relation Age of Onset  . Heart disease Father   . Colon cancer Neg Hx     Social History:  reports that she has never smoked. She has never used smokeless tobacco. She reports that she does not drink alcohol or use illicit drugs.   Allergies:  Allergies  Allergen Reactions  . Demerol Nausea Only  . Other Other (See Comments)    Bee stings-swelling    Medications Prior to Admission  Medication Sig Dispense Refill  . amLODipine (NORVASC) 10 MG tablet Take 10 mg by  mouth every morning.     [name] Kitchen anastrozole (ARIMIDEX) 1 MG tablet Take 1 mg by mouth daily.    [name] Kitchen aspirin EC 325 MG tablet Take 1 tablet (325 mg total) by mouth 2 (two) times daily. 40 tablet 0  . atorvastatin (LIPITOR) 10 MG tablet Take 10 mg by mouth daily.    . benazepril (LOTENSIN) 20 MG tablet Take 20 mg by mouth every morning.     . DULoxetine (CYMBALTA) 60 MG capsule Take 60 mg by mouth at bedtime.     . ferrous sulfate 325 (65 FE) MG tablet Take 1 tablet (325 mg total) by mouth 3 (three) times daily after meals. 90 tablet 3  . gabapentin (NEURONTIN) 300 MG capsule Take 300 mg by mouth 3 (three) times daily.    [name] Kitchen glyBURIDE-metformin (GLUCOVANCE) 2.5-500 MG per tablet Take 1-2 tablets by mouth 2 (two) times daily with a meal. TAKES 2 IN THE MORNING AND 1 AT NIGHT    . hydrALAZINE (APRESOLINE) 25 MG tablet Take 25 mg by mouth 3 (three) times daily.    . insulin glargine (LANTUS) 100 UNIT/ML injection Inject 60 Units into the skin daily.     . methocarbamol (ROBAXIN) 500 MG tablet Take 1 tablet (500 mg total) by mouth every 8 (eight) hours as needed for muscle spasms. 50 tablet 2  . metoprolol succinate (TOPROL-XL) 100 MG 24 hr tablet Take 100 mg by mouth every morning. Take with or immediately following a meal.    . omeprazole (PRILOSEC) 20 MG capsule Take 20 mg by mouth daily.    [name] Kitchen oxyCODONE-acetaminophen (ROXICET) 5-325 MG tablet Take 1-2 tablets by mouth every 4 (four) hours as needed for severe pain. 60 tablet 0  . sitaGLIPtin (JANUVIA) 100 MG tablet Take 100 mg by mouth daily.         [id number] from the symptoms mentioned above,there are no other symptoms referable to all systems reviewed.  Physical Exam: There were no vitals taken for this visit. She is awake and alert. She is sitting in a wheelchair with her right knee in a knee immobilizer. Her vital signs are as recorded at the skilled care facility chart. Her pupils are reactive. Nose and throat are clear. Her neck is supple  without masses. Her chest is clear without wheezes rales or rhonchi. Her heart is regular without murmur gallop or rub. Her abdomen is soft no masses are felt bowel sounds are present and active. She does not have any edema of the extremities her surgical wound looks okay but she does have some blood along the incision line distal to the knee itself. Her central nervous system examination is grossly intact   No results for input(s): WBC, NEUTROABS,  HGB, HCT, MCV, PLT in the last 72 hours. No results for input(s): NA, K, CL, CO2, GLUCOSE, BUN, CREATININE, CALCIUM, MG in the last 72 hours.  Invalid input(s): PHOlablast2(ast:2,ALT:2,alkphos:2,bilitot:2,prot:2,albumin:2)@    No results found for this or any previous visit (from the past 240 hour(s)).   No results found. Impression: She had a knee replacement. She's had some trouble with bleeding along the incision line and has been back to her orthopedist. She has multiple medical problems including diabetes and her blood sugar is doing a little bit better. I have adjusted her medications while she is in a skilled care facility. She has hypertension which is well controlled. She has chronic low back pain I think that is about the same Active Problems:   * No active hospital problems. *     Plan: Continue current treatment. No changes today.      Octavia Mottola L   01/04/2015, 7:17 AM

## 2015-01-08 ENCOUNTER — Other Ambulatory Visit: Payer: Self-pay | Admitting: Pulmonary Disease

## 2015-01-08 ENCOUNTER — Other Ambulatory Visit (HOSPITAL_COMMUNITY): Payer: Self-pay | Admitting: Pulmonary Disease

## 2015-01-08 DIAGNOSIS — Z9889 Other specified postprocedural states: Secondary | ICD-10-CM

## 2015-01-08 DIAGNOSIS — Z853 Personal history of malignant neoplasm of breast: Secondary | ICD-10-CM

## 2015-01-11 ENCOUNTER — Ambulatory Visit (HOSPITAL_COMMUNITY): Payer: Medicare Other | Attending: Pulmonary Disease | Admitting: Physical Therapy

## 2015-01-11 DIAGNOSIS — M25661 Stiffness of right knee, not elsewhere classified: Secondary | ICD-10-CM | POA: Insufficient documentation

## 2015-01-11 DIAGNOSIS — R29898 Other symptoms and signs involving the musculoskeletal system: Secondary | ICD-10-CM | POA: Insufficient documentation

## 2015-01-11 DIAGNOSIS — M25561 Pain in right knee: Secondary | ICD-10-CM | POA: Insufficient documentation

## 2015-01-11 DIAGNOSIS — R262 Difficulty in walking, not elsewhere classified: Secondary | ICD-10-CM

## 2015-01-11 NOTE — Patient Instructions (Addendum)
Toe Raise (Sitting)    Raise toes, keeping heels on floor. Repeat __15__ times per set. Do ___1_ sets per session. Do __3__ sessions per day.  http://orth.exer.us/46   Copyright  VHI. All rights reserved.  Strengthening: Quadriceps Set    Tighten muscles on top of thighs by pushing knees down into surface. Hold _3___ seconds. Repeat _10___ times per set. Do ___1_ sets per session. Do _2___ sessions per day.  http://orth.exer.us/602   Copyright  VHI. All rights reserved.  Self-Mobilization: Heel Slide (Supine)    Slide right  heel toward buttocks until a gentle stretch is felt. Hold _10___ seconds. Relax. Repeat _10___ times per set. Do 1____ sets per session. Do _2___ sessions per day.  http://orth.exer.us/710   Copyright  VHI. All rights reserved.  Bridging    Slowly raise buttocks from floor, keeping stomach tight. Repeat _10___ times per set. Do __1__ sets per session. Do 2____ sessions per day.  http://orth.exer.us/1096   Copyright  VHI. All rights reserved.

## 2015-01-11 NOTE — Therapy (Signed)
[place], [place], [address] Phone: [phone]   Fax:  [phone]  Physical Therapy Evaluation  Patient Details  Name: [name] MRN: [id number] Date of Birth: 1933/06/21 Referring Provider: Theda Sers  Encounter Date: 01/11/2015      PT End of Session - 01/11/15 1451    Visit Number 1   Number of Visits 8   Date for PT Re-Evaluation 02/10/15   Authorization Type [employer] medicare   PT Start Time 1152   PT Stop Time 1238   PT Time Calculation (min) 46 min   Activity Tolerance Patient tolerated treatment well   Behavior During Therapy [facility] for tasks assessed/performed      Past Medical History  Diagnosis Date  . Diabetes mellitus   . Arthritis   . Hyperlipidemia   . Mental disorder   . Chronic kidney disease     renal stones, one episode of Lithotripsy  . GERD (gastroesophageal reflux disease)   . Skin cancer     face- melanoma, 2012- surg. excision    . Breast cancer, left ([place])     er/pr +  . Hypertension     followed by [facility]. relative to  urology study grp.  Doesn't report ever having a stress test   . Complication of anesthesia   . PONV (postoperative nausea and vomiting)   . Cataract     no surgery as yet,starting of 3 years  . Seasonal allergies   . Cirrhosis (HCC)     Metavir score 4  . Nocturia   . Anemia   . Multiple falls   . Abrasion of arm, left     secondary to fall     Past Surgical History  Procedure Laterality Date  . Cholecystectomy    . Abdominal hysterectomy    . Skin biopsy    . Shoulder surgery    . Tubal ligation    . Fracture surgery      1975- ORIF- L ankle   . Breast lumpectomy with needle localization and axillary sentinel lymph node bx  03/12/2012    Procedure: BREAST LUMPECTOMY WITH NEEDLE LOCALIZATION AND AXILLARY SENTINEL LYMPH NODE BX;  Surgeon: Merrie Roof, MD;  Location: [facility];  Service: General;  Laterality: Left;  . Re-excision of breast  lumpectomy  03/22/2012    Procedure: RE-EXCISION OF BREAST LUMPECTOMY;  Surgeon: Merrie Roof, MD;  Location: [facility];  Service: General;  Laterality: Left;  re-excision left breast lumpectomy cavity  ER+, PR+  . Rotator cuff repair Bilateral   . Colonoscopy N/A 11/20/2013    Dr.Rourk- diverticula was found in the left colon. o/w normal rectal, colonic and terminal ileal mucosa  . Esophagogastroduodenoscopy N/A 11/20/2013    Dr.Rourk- abnormal mucosa was found. diffuse snake skinning and friability of the gastric mucosa. patent pylorus. abnormal-appearing first, second and third portion of the duodenum. 23mm gastric nodule at the GE junction. stomach bx= minimal chronic infammation, GE junction bx= polypoid fragments of gastric cardia type mucosa w/mod chronic inflammation and foveolar hyperplasia  . Givens capsule study N/A 02/24/2014    Procedure: GIVENS CAPSULE STUDY;  Surgeon: Daneil Dolin, MD;  [facility];  Service: Endoscopy;  Laterality: N/A;  . Breast surgery      lumpectomy x2/left   . Eye surgery      cataract surgery bilat   . Back surgery      x4 back surgery  to include rods & screws  . Last in May 2014  . Total knee arthroplasty Right 12/18/2014    Procedure: RIGHT TOTAL KNEE ARTHROPLASTY;  Surgeon: Sydnee Cabal, MD;  [facility];  Service: Orthopedics;  Laterality: Right;    There were no vitals filed for this visit.  Visit Diagnosis:  Stiffness of joint, lower leg, right  Difficulty walking  Knee pain, acute, right  Weakness of right leg      Subjective Assessment - 01/11/15 1201    Subjective [name] states that she had a TKR on her Rt knee on 12/18/2014.  On 12/21/14 she went to SNF for rehabilitation.  She was discharged from SNF on 01/08/15 and was discharged to home.  At this time she has been referred to out patient physcial therapy to maximize her functional abiltiy.     Pertinent History Pt  has had 5 back surgeries, DM    How long can you sit comfortably? able to sit for 15 minutes but this is more due to her back    How long can you stand comfortably? she is able to stand for 15 mintues due to her back issues as well     How long can you walk comfortably? Able to walk with the walker for 10 minutes now   Currently in Pain? Yes   Pain Score 5    Pain Location Knee   Pain Orientation Right   Pain Descriptors / Indicators Aching   Multiple Pain Sites Yes   Pain Score 8   Pain Location Back   Pain Orientation Right;Lower   Pain Descriptors / Indicators Aching            [facility] PT Assessment - 01/11/15 0001    Assessment   Medical Diagnosis Rt TKR   Referring Provider Theda Sers   Onset Date/Surgical Date 12/18/14   Next MD Visit 02/04/2015   Prior Therapy SNF    Precautions   Precautions None   Restrictions   Weight Bearing Restrictions No   Balance Screen   Has the patient fallen in the past 6 months Yes  prior to surgery    How many times? 2   Has the patient had a decrease in activity level because of a fear of falling?  Yes   Is the patient reluctant to leave their home because of a fear of falling?  No   Home Ecologist residence   Prior Function   Level of Independence Independent with community mobility with device   Vocation Retired   Leisure walking, outside work    Charity fundraiser Status Within [employer] for tasks assessed   Observation/Other Assessments   Focus on Therapeutic Outcomes (FOTO)  28   Observation/Other Assessments-Edema    Edema Circumferential  49.2 cm   Functional Tests   Functional tests Single leg stance   Single Leg Stance   Comments unable B     ROM / Strength   AROM / PROM / Strength AROM;Strength   AROM   AROM Assessment Site Knee   Right/Left Knee Right   Right Knee Extension 9   Right Knee Flexion 105   Strength   Strength Assessment Site Hip;Knee;Ankle   Right/Left Hip Right   Right Hip  Extension 3-/5   Right Hip ABduction 3/5   Right/Left Knee Right   Right Knee Flexion 4+/5   Right Knee Extension 4+/5   Right/Left Ankle Right   Right  Ankle Dorsiflexion 4-/5   6 Minute Walk- Baseline   6 Minute Walk- Baseline yes   6 minute walk test results    Aerobic Endurance Distance Walked 462                   [facility] Adult PT Treatment/Exercise - 01/11/15 0001    Exercises   Exercises Knee/Hip   Knee/Hip Exercises: Seated   Long Arc Quad 5 reps   Knee/Hip Exercises: Supine   Quad Sets 10 reps   Heel Slides 5 reps   Bridges 10 reps                PT Education - 01/11/15 1222    Education provided Yes   Education Details HEP   Person(s) Educated Patient   Methods Explanation   Comprehension Verbalized understanding          PT Short Term Goals - 01/11/15 1458    PT SHORT TERM GOAL #1   Title I HEP   Time 1   Period Weeks   PT SHORT TERM GOAL #2   Title Pt edema to be decreased by 2 cm to decrease pain and allow increased flexion    Time 2   Period Weeks   PT SHORT TERM GOAL #3   Title ROM to be to 115 to allow pt to sit with comfort for two hour duration to be able to travel.    Time 2   Period Weeks           PT Long Term Goals - 01/11/15 1459    PT LONG TERM GOAL #1   Title I in advance HEP   Time 4   Period Weeks   PT LONG TERM GOAL #2   Title Pt to be able to SLS x 5 seconds B to decrease pt risk of falling    Time 4   Period Weeks   PT LONG TERM GOAL #3   Title Pt mm strength to be increased to at least 4/ 5 to reduce risk of falling    Time 4   Period Weeks   PT LONG TERM GOAL #4   Title Pt knee pain to be no greater than a 2/10 throughout the day    Time 4   Period Weeks   PT LONG TERM GOAL #5   Title Pt to be able to ambutlate 500 ft during a 6 minute walk test to improve stability on her feet.    Time 4   Period Weeks               Plan - 01/11/15 1452    Clinical Impression Statement [name] is  a 81 yo female who has had a recent Rt TKR.  She was falling prior to her total knee due to her knee giving out.  She went to [facility] rehabilitation following the operation and was discharged to home on 01/08/2015.  At this time she is being referred to outpaiient physical therapy to maximize her functional potential.  At this time her main deficits appears to be ediema, ROM, strength and pain.   She is a known patient to me due to her previous back surgeries.  At this time she appears to be walking close to the same gait pattern she had previouly.  We will see [name] for her new deficits to maximize her functional potential.    Pt will benefit from skilled therapeutic intervention in order to improve on  the following deficits Decreased activity tolerance;Decreased balance;Decreased range of motion;Difficulty walking;Pain   Rehab Potential Good   PT Frequency 2x / week   PT Duration 4 weeks   PT Treatment/Interventions Gait training;Functional mobility training;Therapeutic activities;Therapeutic exercise;Balance training;Patient/family education;Passive range of motion;Manual techniques   PT Next Visit Plan Begin manual techniques for edema control and progress ROM followed by functional strengthening.    PT Home Exercise Plan given           G-Codes - 01-27-15 1502    Functional Limitation Mobility: Walking and moving around   Mobility: Walking and Moving Around Current Status [phone]) At least 60 percent but less than 80 percent impaired, limited or restricted   Mobility: Walking and Moving Around Goal Status [phone]) At least 40 percent but less than 60 percent impaired, limited or restricted       Problem List Patient Active Problem List   Diagnosis Date Noted  . S/P knee replacement 12/18/2014  . Osteoarthritis of right knee 12/18/2014  . IDA (iron deficiency anemia) 10/29/2013  . Lumbago 08/06/2013  . Difficulty in walking(719.7) 08/06/2013  . Bilateral leg weakness 09/23/2012   . Acute renal failure ([place]) 07/25/2012  . Dehydration 07/25/2012  . DM type 2, uncontrolled, with neuropathy ([place]) 07/25/2012  . DM (diabetes mellitus), type 2, uncontrolled ([place]) 07/25/2012  . Essential hypertension, benign 07/25/2012  . Generalized weakness 07/25/2012  . Hyperlipidemia   . Chronic kidney disease   . Skin cancer   . Hypertension   . GERD (gastroesophageal reflux disease)   . Breast cancer ([place]) 02/19/2012  . CAP (community acquired pneumonia) 04/19/2011  . Nausea and vomiting 04/19/2011  . HTN (hypertension) 04/19/2011  . DM type 2 (diabetes mellitus, type 2) ([place]) 04/19/2011  . Arthritis 04/19/2011   Rayetta Humphrey, PT CLT [phone] 27-Jan-2015, 3:05 PM  [place] [address] [place], [place], [address] Phone: [phone]   Fax:  [phone]  Name: [name] MRN: [id number] Date of Birth: 10-26-1933

## 2015-01-14 ENCOUNTER — Ambulatory Visit (HOSPITAL_COMMUNITY): Payer: Medicare Other | Admitting: Physical Therapy

## 2015-01-18 ENCOUNTER — Ambulatory Visit (HOSPITAL_COMMUNITY): Payer: Medicare Other | Admitting: Physical Therapy

## 2015-01-18 DIAGNOSIS — R262 Difficulty in walking, not elsewhere classified: Secondary | ICD-10-CM

## 2015-01-18 DIAGNOSIS — M25661 Stiffness of right knee, not elsewhere classified: Secondary | ICD-10-CM | POA: Diagnosis not present

## 2015-01-18 DIAGNOSIS — R29898 Other symptoms and signs involving the musculoskeletal system: Secondary | ICD-10-CM

## 2015-01-18 DIAGNOSIS — M25561 Pain in right knee: Secondary | ICD-10-CM

## 2015-01-18 NOTE — Therapy (Signed)
[place], [place], [address] Phone: [phone]   Fax:  [phone]  Physical Therapy Treatment  Patient Details  Name: SAYLA KALEN MRN: [id number] Date of Birth: 03-01-1933 Referring Provider: Theda Sers  Encounter Date: 01/18/2015      PT End of Session - 01/18/15 1413    Visit Number 2   Number of Visits 8   Date for PT Re-Evaluation 02/10/15   Authorization Type [employer] medicare   Authorization - Visit Number 2   Authorization - Number of Visits 10   PT Start Time 1300   PT Stop Time 1345   PT Time Calculation (min) 45 min   Activity Tolerance Patient tolerated treatment well   Behavior During Therapy [facility] for tasks assessed/performed      Past Medical History  Diagnosis Date  . Diabetes mellitus   . Arthritis   . Hyperlipidemia   . Mental disorder   . Chronic kidney disease     renal stones, one episode of Lithotripsy  . GERD (gastroesophageal reflux disease)   . Skin cancer     face- melanoma, 2012- surg. excision    . Breast cancer, left ([place])     er/pr +  . Hypertension     followed by [place] gr[facility]. relative to  urology study grp.  Doesn't report ever having a stress test   . Complication of anesthesia   . PONV (postoperative nausea and vomiting)   . Cataract     no surgery as yet,starting of 3 years  . Seasonal allergies   . Cirrhosis (HCC)     Metavir score 4  . Nocturia   . Anemia   . Multiple falls   . Abrasion of arm, left     secondary to fall     Past Surgical History  Procedure Laterality Date  . Cholecystectomy    . Abdominal hysterectomy    . Skin biopsy    . Shoulder surgery    . Tubal ligation    . Fracture surgery      1975- ORIF- L ankle   . Breast lumpectomy with needle localization and axillary sentinel lymph node bx  03/12/2012    Procedure: BREAST LUMPECTOMY WITH NEEDLE LOCALIZATION AND AXILLARY SENTINEL LYMPH NODE BX;  Surgeon: Merrie Roof, MD;  Location:  [place];  Service: General;  Laterality: Left;  . Re-excision of breast lumpectomy  03/22/2012    Procedure: RE-EXCISION OF BREAST LUMPECTOMY;  Surgeon: Merrie Roof, MD;  Location: [place];  Service: General;  Laterality: Left;  re-excision left breast lumpectomy cavity  ER+, PR+  . Rotator cuff repair Bilateral   . Colonoscopy N/A 11/20/2013    Dr.Rourk- diverticula was found in the left colon. o/w normal rectal, colonic and terminal ileal mucosa  . Esophagogastroduodenoscopy N/A 11/20/2013    Dr.Rourk- abnormal mucosa was found. diffuse snake skinning and friability of the gastric mucosa. patent pylorus. abnormal-appearing first, second and third portion of the duodenum. 80mm gastric nodule at the GE junction. stomach bx= minimal chronic infammation, GE junction bx= polypoid fragments of gastric cardia type mucosa w/mod chronic inflammation and foveolar hyperplasia  . Givens capsule study N/A 02/24/2014    Procedure: GIVENS CAPSULE STUDY;  Surgeon: Daneil Dolin, MD;  [facility];  Service: Endoscopy;  Laterality: N/A;  . Breast surgery      lumpectomy x2/left   . Eye surgery      cataract  surgery bilat   . Back surgery      x4 back surgery to include rods & screws  . Last in May 2014  . Total knee arthroplasty Right 12/18/2014    Procedure: RIGHT TOTAL KNEE ARTHROPLASTY;  Surgeon: Sydnee Cabal, MD;  [facility];  Service: Orthopedics;  Laterality: Right;    There were no vitals filed for this visit.  Visit Diagnosis:  Stiffness of joint, lower leg, right  Difficulty walking  Knee pain, acute, right  Weakness of right leg      Subjective Assessment - 01/18/15 1328    Subjective PT reports compliance with HEP and states she is only having minimal pain today of 2/10.  States she got some slides for the back legs of her walker.    Currently in Pain? Yes   Pain Score 2    Pain Location Knee   Pain Orientation Right   Pain Descriptors /  Indicators Aching                         [facility] Adult PT Treatment/Exercise - 01/18/15 1329    Knee/Hip Exercises: Supine   Quad Sets 15 reps   Short Arc Quad Sets Right;15 reps   Heel Slides Right;10 reps   Bridges 15 reps   Manual Therapy   Manual Therapy Edema management   Manual therapy comments Rt knee with elevatio n   Edema Management retro massage                PT Education - 01/18/15 1411    Education provided Yes   Education Details HEP review and copy given of inital evaluation with explanation of measurements and goals.    Person(s) Educated Patient   Methods Explanation;Demonstration;Handout   Comprehension Verbalized understanding          PT Short Term Goals - 01/11/15 1458    PT SHORT TERM GOAL #1   Title I HEP   Time 1   Period Weeks   PT SHORT TERM GOAL #2   Title Pt edema to be decreased by 2 cm to decrease pain and allow increased flexion    Time 2   Period Weeks   PT SHORT TERM GOAL #3   Title ROM to be to 115 to allow pt to sit with comfort for two hour duration to be able to travel.    Time 2   Period Weeks           PT Long Term Goals - 01/11/15 1459    PT LONG TERM GOAL #1   Title I in advance HEP   Time 4   Period Weeks   PT LONG TERM GOAL #2   Title Pt to be able to SLS x 5 seconds B to decrease pt risk of falling    Time 4   Period Weeks   PT LONG TERM GOAL #3   Title Pt mm strength to be increased to at least 4/ 5 to reduce risk of falling    Time 4   Period Weeks   PT LONG TERM GOAL #4   Title Pt knee pain to be no greater than a 2/10 throughout the day    Time 4   Period Weeks   PT LONG TERM GOAL #5   Title Pt to be able to ambutlate 500 ft during a 6 minute walk test to improve stability on her feet.    Time 4  Period Weeks               Plan - 01/18/15 1414    Clinical Impression Statement HEP established with patient requiring only verbal cues to complete in correct form.   Reviewed evaluation with patient and progressed ROM/strengthening exercises.  Completed manual to knee as remains indurated.  Able to decrease swelling and loosen tissue following retro massage.    Pt will benefit from skilled therapeutic intervention in order to improve on the following deficits --   Rehab Potential --   PT Frequency --   PT Duration --   PT Treatment/Interventions --   PT Next Visit Plan continue to progress strenthening, ROM and return to functional independence.  Continue with manual to decrease induration.  Work on posture with gait and begin functional standing exercises.    PT Home Exercise Plan given         Problem List Patient Active Problem List   Diagnosis Date Noted  . S/P knee replacement 12/18/2014  . Osteoarthritis of right knee 12/18/2014  . IDA (iron deficiency anemia) 10/29/2013  . Lumbago 08/06/2013  . Difficulty in walking(719.7) 08/06/2013  . Bilateral leg weakness 09/23/2012  . Acute renal failure ([place]) 07/25/2012  . Dehydration 07/25/2012  . DM type 2, uncontrolled, with neuropathy ([place]) 07/25/2012  . DM (diabetes mellitus), type 2, uncontrolled ([place]) 07/25/2012  . Essential hypertension, benign 07/25/2012  . Generalized weakness 07/25/2012  . Hyperlipidemia   . Chronic kidney disease   . Skin cancer   . Hypertension   . GERD (gastroesophageal reflux disease)   . Breast cancer ([place]) 02/19/2012  . CAP (community acquired pneumonia) 04/19/2011  . Nausea and vomiting 04/19/2011  . HTN (hypertension) 04/19/2011  . DM type 2 (diabetes mellitus, type 2) ([place]) 04/19/2011  . Arthritis 04/19/2011    Teena Irani, PTA/CLT [phone]  01/18/2015, 2:45 PM  [facility] [address] [place], [place], [address] Phone: [phone]   Fax:  [phone]  Name: ADILYNN SALB MRN: [id number] Date of Birth: [birth year]

## 2015-01-25 ENCOUNTER — Ambulatory Visit (HOSPITAL_COMMUNITY): Payer: Medicare Other | Admitting: Physical Therapy

## 2015-01-25 DIAGNOSIS — R262 Difficulty in walking, not elsewhere classified: Secondary | ICD-10-CM

## 2015-01-25 DIAGNOSIS — M25661 Stiffness of right knee, not elsewhere classified: Secondary | ICD-10-CM | POA: Diagnosis not present

## 2015-01-25 DIAGNOSIS — M25561 Pain in right knee: Secondary | ICD-10-CM

## 2015-01-25 DIAGNOSIS — R29898 Other symptoms and signs involving the musculoskeletal system: Secondary | ICD-10-CM

## 2015-01-25 NOTE — Therapy (Signed)
[place], [place], [address] Phone: [phone]   Fax:  [phone]  Physical Therapy Treatment  Patient Details  Name: [name] MRN: [id number] Date of Birth: 1933/04/07 Referring Provider: Theda Sers  Encounter Date: 01/25/2015      PT End of Session - 01/25/15 1422    Visit Number 3   Number of Visits 8   Date for PT Re-Evaluation 02/10/15   Authorization Type [place] - Visit Number 3   Authorization - Number of Visits 10   PT Start Time [id number]   PT Stop Time 1435   PT Time Calculation (min) 46 min   Activity Tolerance Patient tolerated treatment well   Behavior During Therapy [facility] for tasks assessed/performed      Past Medical History  Diagnosis Date  . Diabetes mellitus   . Arthritis   . Hyperlipidemia   . Mental disorder   . Chronic kidney disease     renal stones, one episode of Lithotripsy  . GERD (gastroesophageal reflux disease)   . Skin cancer     face- melanoma, 2012- surg. excision    . Breast cancer, left ([place])     er/pr +  . Hypertension     followed by [place] [facility]. relative to  urology study grp.  Doesn't report ever having a stress test   . Complication of anesthesia   . PONV (postoperative nausea and vomiting)   . Cataract     no surgery as yet,starting of 3 years  . Seasonal allergies   . Cirrhosis (HCC)     Metavir score 4  . Nocturia   . Anemia   . Multiple falls   . Abrasion of arm, left     secondary to fall     Past Surgical History  Procedure Laterality Date  . Cholecystectomy    . Abdominal hysterectomy    . Skin biopsy    . Shoulder surgery    . Tubal ligation    . Fracture surgery      1975- ORIF- L ankle   . Breast lumpectomy with needle localization and axillary sentinel lymph node bx  03/12/2012    Procedure: BREAST LUMPECTOMY WITH NEEDLE LOCALIZATION AND AXILLARY SENTINEL LYMPH NODE BX;  Surgeon: Merrie Roof, MD;  Location:  [place];  Service: General;  Laterality: Left;  . Re-excision of breast lumpectomy  03/22/2012    Procedure: RE-EXCISION OF BREAST LUMPECTOMY;  Surgeon: Merrie Roof, MD;  Location: [place];  Service: General;  Laterality: Left;  re-excision left breast lumpectomy cavity  ER+, PR+  . Rotator cuff repair Bilateral   . Colonoscopy N/A 11/20/2013    Dr.Rourk- diverticula was found in the left colon. o/w normal rectal, colonic and terminal ileal mucosa  . Esophagogastroduodenoscopy N/A 11/20/2013    Dr.Rourk- abnormal mucosa was found. diffuse snake skinning and friability of the gastric mucosa. patent pylorus. abnormal-appearing first, second and third portion of the duodenum. 41mm gastric nodule at the GE junction. stomach bx= minimal chronic infammation, GE junction bx= polypoid fragments of gastric cardia type mucosa w/mod chronic inflammation and foveolar hyperplasia  . Givens capsule study N/A 02/24/2014    Procedure: GIVENS CAPSULE STUDY;  Surgeon: Daneil Dolin, MD;  [facility];  Service: Endoscopy;  Laterality: N/A;  . Breast surgery      lumpectomy x2/left   . Eye surgery      cataract  surgery bilat   . Back surgery      x4 back surgery to include rods & screws  . Last in May 2014  . Total knee arthroplasty Right 12/18/2014    Procedure: RIGHT TOTAL KNEE ARTHROPLASTY;  Surgeon: Sydnee Cabal, MD;  [facility];  Service: Orthopedics;  Laterality: Right;    There were no vitals filed for this visit.  Visit Diagnosis:  No diagnosis found.      Subjective Assessment - 01/25/15 1821    Subjective Pt reports a little discomfort in her knee today and completing her HEPl.   Currently in Pain? Yes   Pain Score 2    Pain Location Knee   Pain Orientation Right                         [facility] Adult PT Treatment/Exercise - 01/25/15 1413    Knee/Hip Exercises: Stretches   Active Hamstring Stretch Right;2 reps;30 seconds   Knee/Hip  Exercises: Standing   Heel Raises 10 reps   Heel Raises Limitations toeraise 10 reps   Forward Lunges Right;10 reps   Knee/Hip Exercises: Supine   Quad Sets 15 reps   Short Arc Quad Sets Right;15 reps   Heel Slides Right;10 reps   Bridges 15 reps   Straight Leg Raises Right;10 reps   Knee Flexion Right;AROM   Knee Flexion Limitations 115 degrees   Manual Therapy   Manual Therapy Edema management;Myofascial release   Manual therapy comments Rt knee with elevatio n   Edema Management retro massage   Myofascial Release to decrease adhesions                  PT Short Term Goals - 01/11/15 1458    PT SHORT TERM GOAL #1   Title I HEP   Time 1   Period Weeks   PT SHORT TERM GOAL #2   Title Pt edema to be decreased by 2 cm to decrease pain and allow increased flexion    Time 2   Period Weeks   PT SHORT TERM GOAL #3   Title ROM to be to 115 to allow pt to sit with comfort for two hour duration to be able to travel.    Time 2   Period Weeks           PT Long Term Goals - 01/11/15 1459    PT LONG TERM GOAL #1   Title I in advance HEP   Time 4   Period Weeks   PT LONG TERM GOAL #2   Title Pt to be able to SLS x 5 seconds B to decrease pt risk of falling    Time 4   Period Weeks   PT LONG TERM GOAL #3   Title Pt mm strength to be increased to at least 4/ 5 to reduce risk of falling    Time 4   Period Weeks   PT LONG TERM GOAL #4   Title Pt knee pain to be no greater than a 2/10 throughout the day    Time 4   Period Weeks   PT LONG TERM GOAL #5   Title Pt to be able to ambutlate 500 ft during a 6 minute walk test to improve stability on her feet.    Time 4   Period Weeks               Plan - 01/25/15 1423    Clinical Impression  Statement Pt with slight decrease in swelling in RT knee today.  Progressed with standing exercises including forward lunge, heel and toe raises.  PT required therapist assist and verbal cues to complete in correct form.   Conitnues to have heat in her knee with edema and induration.  Manual techniques completed with good loosening of adhesions and increase in ROM.  AROM to 115 at end of session.    PT Next Visit Plan continue to progress strenthening, ROM and return to functional independence.  Continue with manual to decrease induration.  Work on posture with gait and begin functional standing exercises.    PT Home Exercise Plan given         Problem List Patient Active Problem List   Diagnosis Date Noted  . S/P knee replacement 12/18/2014  . Osteoarthritis of right knee 12/18/2014  . IDA (iron deficiency anemia) 10/29/2013  . Lumbago 08/06/2013  . Difficulty in walking(719.7) 08/06/2013  . Bilateral leg weakness 09/23/2012  . Acute renal failure ([place]) 07/25/2012  . Dehydration 07/25/2012  . DM type 2, uncontrolled, with neuropathy ([place]) 07/25/2012  . DM (diabetes mellitus), type 2, uncontrolled ([place]) 07/25/2012  . Essential hypertension, benign 07/25/2012  . Generalized weakness 07/25/2012  . Hyperlipidemia   . Chronic kidney disease   . Skin cancer   . Hypertension   . GERD (gastroesophageal reflux disease)   . Breast cancer ([place]) 02/19/2012  . CAP (community acquired pneumonia) 04/19/2011  . Nausea and vomiting 04/19/2011  . HTN (hypertension) 04/19/2011  . DM type 2 (diabetes mellitus, type 2) ([place]) 04/19/2011  . Arthritis 04/19/2011    Teena Irani, PTA/CLT [phone]  01/25/2015, 6:22 PM  [facility] [address] [place], [place], [address] Phone: [phone]   Fax:  [phone]  Name: [name] MRN: [id number] Date of Birth: 1933/04/23

## 2015-01-28 ENCOUNTER — Ambulatory Visit (HOSPITAL_COMMUNITY): Payer: Medicare Other | Attending: Pulmonary Disease | Admitting: Physical Therapy

## 2015-01-28 DIAGNOSIS — M25661 Stiffness of right knee, not elsewhere classified: Secondary | ICD-10-CM | POA: Diagnosis not present

## 2015-01-28 DIAGNOSIS — R262 Difficulty in walking, not elsewhere classified: Secondary | ICD-10-CM | POA: Insufficient documentation

## 2015-01-28 DIAGNOSIS — R29898 Other symptoms and signs involving the musculoskeletal system: Secondary | ICD-10-CM | POA: Diagnosis present

## 2015-01-28 DIAGNOSIS — M25561 Pain in right knee: Secondary | ICD-10-CM | POA: Diagnosis present

## 2015-01-28 NOTE — Therapy (Signed)
[place], [place], [address] Phone: [phone]   Fax:  [phone]  Physical Therapy Treatment  Patient Details  Name: [name] MRN: [id number] Date of Birth: 1933/07/12 Referring Provider: Theda Sers  Encounter Date: 01/28/2015      PT End of Session - 01/28/15 1345    Visit Number 4   Number of Visits 8   Date for PT Re-Evaluation 02/10/15   Authorization Type [employer] medicare   Authorization - Visit Number 4   Authorization - Number of Visits 8   PT Start Time [id number]   PT Stop Time 1345   PT Time Calculation (min) 42 min   Activity Tolerance Patient tolerated treatment well   Behavior During Therapy [facility] for tasks assessed/performed      Past Medical History  Diagnosis Date  . Diabetes mellitus   . Arthritis   . Hyperlipidemia   . Mental disorder   . Chronic kidney disease     renal stones, one episode of Lithotripsy  . GERD (gastroesophageal reflux disease)   . Skin cancer     face- melanoma, 2012- surg. excision    . Breast cancer, left ([place])     er/pr +  . Hypertension     followed by [place] gr[facility]. relative to  urology study grp.  Doesn't report ever having a stress test   . Complication of anesthesia   . PONV (postoperative nausea and vomiting)   . Cataract     no surgery as yet,starting of 3 years  . Seasonal allergies   . Cirrhosis (HCC)     Metavir score 4  . Nocturia   . Anemia   . Multiple falls   . Abrasion of arm, left     secondary to fall     Past Surgical History  Procedure Laterality Date  . Cholecystectomy    . Abdominal hysterectomy    . Skin biopsy    . Shoulder surgery    . Tubal ligation    . Fracture surgery      1975- ORIF- L ankle   . Breast lumpectomy with needle localization and axillary sentinel lymph node bx  03/12/2012    Procedure: BREAST LUMPECTOMY WITH NEEDLE LOCALIZATION AND AXILLARY SENTINEL LYMPH NODE BX;  Surgeon: Merrie Roof, MD;  Location: [place];  Service: General;  Laterality: Left;  . Re-excision of breast lumpectomy  03/22/2012    Procedure: RE-EXCISION OF BREAST LUMPECTOMY;  Surgeon: Merrie Roof, MD;  Location: [facility];  Service: General;  Laterality: Left;  re-excision left breast lumpectomy cavity  ER+, PR+  . Rotator cuff repair Bilateral   . Colonoscopy N/A 11/20/2013    Dr.Rourk- diverticula was found in the left colon. o/w normal rectal, colonic and terminal ileal mucosa  . Esophagogastroduodenoscopy N/A 11/20/2013    Dr.Rourk- abnormal mucosa was found. diffuse snake skinning and friability of the gastric mucosa. patent pylorus. abnormal-appearing first, second and third portion of the duodenum. 7mm gastric nodule at the GE junction. stomach bx= minimal chronic infammation, GE junction bx= polypoid fragments of gastric cardia type mucosa w/mod chronic inflammation and foveolar hyperplasia  . Givens capsule study N/A 02/24/2014    Procedure: GIVENS CAPSULE STUDY;  Surgeon: Daneil Dolin, MD;  [facility];  Service: Endoscopy;  Laterality: N/A;  . Breast surgery      lumpectomy x2/left   . Eye surgery      cataract  surgery bilat   . Back surgery      x4 back surgery to include rods & screws  . Last in May 2014  . Total knee arthroplasty Right 12/18/2014    Procedure: RIGHT TOTAL KNEE ARTHROPLASTY;  Surgeon: Sydnee Cabal, MD;  [facility];  Service: Orthopedics;  Laterality: Right;    There were no vitals filed for this visit.  Visit Diagnosis:  Stiffness of joint, lower leg, right  Difficulty walking  Knee pain, acute, right  Weakness of right leg      Subjective Assessment - 01/28/15 1302    Subjective Pt states she has been doing her exercises.  States that her back is bothering her more than her knee    Currently in Pain? Yes   Pain Score 5   0 for knee   Pain Location Back                         [facility] Adult PT Treatment/Exercise - 01/28/15 1310     Exercises   Exercises Knee/Hip   Knee/Hip Exercises: Stretches   Knee: Self-Stretch to increase Flexion Right;3 reps   Knee: Self-Stretch Limitations 16"box   Gastroc Stretch Both;1 rep;60 seconds   Gastroc Stretch Limitations slant board    Knee/Hip Exercises: Standing   Heel Raises Both   Knee Flexion Right;10 reps   Forward Step Up 10 reps   Functional Squat 10 reps   Rocker Board 2 minutes   SLS x5   Knee/Hip Exercises: Supine   Quad Sets 10 reps   Heel Slides 10 reps   Bridges 10 reps   Knee Extension PROM   Knee Extension Limitations 3  AROM 5    Knee Flexion PROM   Knee Flexion Limitations 114   Manual Therapy   Manual Therapy Edema management   Edema Management decongestive techniques for decreased edema                PT Education - 01/28/15 1345    Education provided Yes   Education Details The importance of engaging abdominal mm to maintain an upright postition while walking and completing exercies    Person(s) Educated Patient   Methods Explanation   Comprehension Verbalized understanding;Returned demonstration          PT Short Term Goals - 01/11/15 1458    PT SHORT TERM GOAL #1   Title I HEP   Time 1   Period Weeks   PT SHORT TERM GOAL #2   Title Pt edema to be decreased by 2 cm to decrease pain and allow increased flexion    Time 2   Period Weeks   PT SHORT TERM GOAL #3   Title ROM to be to 115 to allow pt to sit with comfort for two hour duration to be able to travel.    Time 2   Period Weeks           PT Long Term Goals - 01/11/15 1459    PT LONG TERM GOAL #1   Title I in advance HEP   Time 4   Period Weeks   PT LONG TERM GOAL #2   Title Pt to be able to SLS x 5 seconds B to decrease pt risk of falling    Time 4   Period Weeks   PT LONG TERM GOAL #3   Title Pt mm strength to be increased to at least 4/ 5 to reduce risk of  falling    Time 4   Period Weeks   PT LONG TERM GOAL #4   Title Pt knee pain to be no greater  than a 2/10 throughout the day    Time 4   Period Weeks   PT LONG TERM GOAL #5   Title Pt to be able to ambutlate 500 ft during a 6 minute walk test to improve stability on her feet.    Time 4   Period Weeks               Plan - 01/28/15 1346    Clinical Impression Statement PT improving with ROM.  Added higher level strengthening exercises today with verbala nd manula cuing needed for proper form    PT Next Visit Plan begin sit to stand and lateral step ups         Problem List Patient Active Problem List   Diagnosis Date Noted  . S/P knee replacement 12/18/2014  . Osteoarthritis of right knee 12/18/2014  . IDA (iron deficiency anemia) 10/29/2013  . Lumbago 08/06/2013  . Difficulty in walking(719.7) 08/06/2013  . Bilateral leg weakness 09/23/2012  . Acute renal failure ([place]) 07/25/2012  . Dehydration 07/25/2012  . DM type 2, uncontrolled, with neuropathy ([place]) 07/25/2012  . DM (diabetes mellitus), type 2, uncontrolled ([place]) 07/25/2012  . Essential hypertension, benign 07/25/2012  . Generalized weakness 07/25/2012  . Hyperlipidemia   . Chronic kidney disease   . Skin cancer   . Hypertension   . GERD (gastroesophageal reflux disease)   . Breast cancer ([place]) 02/19/2012  . CAP (community acquired pneumonia) 04/19/2011  . Nausea and vomiting 04/19/2011  . HTN (hypertension) 04/19/2011  . DM type 2 (diabetes mellitus, type 2) ([place]) 04/19/2011  . Arthritis 04/19/2011   Rayetta Humphrey, PT CLT [phone] 01/28/2015, 1:48 PM  [facility] [address] [place], [place], [address] Phone: [phone]   Fax:  [phone]  Name: [name] MRN: [id number] Date of Birth: 21-Oct-1933

## 2015-02-04 ENCOUNTER — Ambulatory Visit (HOSPITAL_COMMUNITY): Payer: Medicare Other | Admitting: Physical Therapy

## 2015-02-04 DIAGNOSIS — R262 Difficulty in walking, not elsewhere classified: Secondary | ICD-10-CM | POA: Diagnosis present

## 2015-02-04 DIAGNOSIS — R29898 Other symptoms and signs involving the musculoskeletal system: Secondary | ICD-10-CM

## 2015-02-04 DIAGNOSIS — M25661 Stiffness of right knee, not elsewhere classified: Secondary | ICD-10-CM

## 2015-02-04 DIAGNOSIS — M25561 Pain in right knee: Secondary | ICD-10-CM

## 2015-02-04 NOTE — Therapy (Addendum)
[place], [place], [address] Phone: [phone]   Fax:  [phone]  Physical Therapy Treatment  Patient Details  Name: [name] MRN: [id number] Date of Birth: Jan 14, 1934 Referring Provider: Theda Sers  Encounter Date: 02/04/2015      PT End of Session - 02/04/15 1409    Visit Number 5   Number of Visits 8   Date for PT Re-Evaluation 02/10/15   Authorization Type [employer] medicare   Authorization - Visit Number 5   Authorization - Number of Visits 8   PT Start Time 1300   PT Stop Time 1345   PT Time Calculation (min) 45 min   Activity Tolerance Patient tolerated treatment well   Behavior During Therapy [facility] for tasks assessed/performed      Past Medical History  Diagnosis Date  . Diabetes mellitus   . Arthritis   . Hyperlipidemia   . Mental disorder   . Chronic kidney disease     renal stones, one episode of Lithotripsy  . GERD (gastroesophageal reflux disease)   . Skin cancer     face- melanoma, 2012- surg. excision    . Breast cancer, left ([place])     er/pr +  . Hypertension     followed by [place] [facility]. relative to  urology study grp.  Doesn't report ever having a stress test   . Complication of anesthesia   . PONV (postoperative nausea and vomiting)   . Cataract     no surgery as yet,starting of 3 years  . Seasonal allergies   . Cirrhosis (HCC)     Metavir score 4  . Nocturia   . Anemia   . Multiple falls   . Abrasion of arm, left     secondary to fall     Past Surgical History  Procedure Laterality Date  . Cholecystectomy    . Abdominal hysterectomy    . Skin biopsy    . Shoulder surgery    . Tubal ligation    . Fracture surgery      1975- ORIF- L ankle   . Breast lumpectomy with needle localization and axillary sentinel lymph node bx  03/12/2012    Procedure: BREAST LUMPECTOMY WITH NEEDLE LOCALIZATION AND AXILLARY SENTINEL LYMPH NODE BX;  Surgeon: Merrie Roof, MD;  Location: [facility];  Service: General;  Laterality: Left;  . Re-excision of breast lumpectomy  03/22/2012    Procedure: RE-EXCISION OF BREAST LUMPECTOMY;  Surgeon: Merrie Roof, MD;  Location: [facility];  Service: General;  Laterality: Left;  re-excision left breast lumpectomy cavity  ER+, PR+  . Rotator cuff repair Bilateral   . Colonoscopy N/A 11/20/2013    Dr.Rourk- diverticula was found in the left colon. o/w normal rectal, colonic and terminal ileal mucosa  . Esophagogastroduodenoscopy N/A 11/20/2013    Dr.Rourk- abnormal mucosa was found. diffuse snake skinning and friability of the gastric mucosa. patent pylorus. abnormal-appearing first, second and third portion of the duodenum. 12mm gastric nodule at the GE junction. stomach bx= minimal chronic infammation, GE junction bx= polypoid fragments of gastric cardia type mucosa w/mod chronic inflammation and foveolar hyperplasia  . Givens capsule study N/A 02/24/2014    Procedure: GIVENS CAPSULE STUDY;  Surgeon: Daneil Dolin, MD;  [facility];  Service: Endoscopy;  Laterality: N/A;  . Breast surgery      lumpectomy x2/left   . Eye surgery      cataract  surgery bilat   . Back surgery      x4 back surgery to include rods & screws  . Last in May 2014  . Total knee arthroplasty Right 12/18/2014    Procedure: RIGHT TOTAL KNEE ARTHROPLASTY;  Surgeon: Sydnee Cabal, MD;  Location: [facility];  Service: Orthopedics;  Laterality: Right;    There were no vitals filed for this visit.  Visit Diagnosis:  Stiffness of joint, lower leg, right  Difficulty walking  Knee pain, acute, right  Weakness of right leg      Subjective Assessment - 02/04/15 1417    Subjective PT statss she fell over the weekend and hurt her lip and side of her face.  States she used her SPC instead of her walker and slid up in the grass.  Pt reports no pain in her knee today.   Currently in Pain? No/denies                         [facility] Adult  PT Treatment/Exercise - 02/04/15 0001    Knee/Hip Exercises: Stretches   Active Hamstring Stretch Right;2 reps;30 seconds   Knee: Self-Stretch to increase Flexion Right;3 reps   Knee: Self-Stretch Limitations 8"box   Gastroc Stretch Both;1 rep;60 seconds   Gastroc Stretch Limitations slant board    Knee/Hip Exercises: Standing   Heel Raises Both   Heel Raises Limitations toeraise 10 reps   Knee/Hip Exercises: Seated   Long Arc Quad Right;10 reps   Sit to [employer] 5 reps   Knee/Hip Exercises: Supine   Quad Sets 10 reps   Heel Slides 10 reps   Bridges 10 reps   Straight Leg Raises Right;10 reps   Straight Leg Raises Limitations     Manual Therapy   Manual Therapy Edema management;Myofascial release   Manual therapy comments Rt knee with elevation   Edema Management retro massage   Myofascial Release to decrease adhesions                  PT Short Term Goals - 01/11/15 1458    PT SHORT TERM GOAL #1   Title I HEP   Time 1   Period Weeks   PT SHORT TERM GOAL #2   Title Pt edema to be decreased by 2 cm to decrease pain and allow increased flexion    Time 2   Period Weeks   PT SHORT TERM GOAL #3   Title ROM to be to 115 to allow pt to sit with comfort for two hour duration to be able to travel.    Time 2   Period Weeks           PT Long Term Goals - 01/11/15 1459    PT LONG TERM GOAL #1   Title I in advance HEP   Time 4   Period Weeks   PT LONG TERM GOAL #2   Title Pt to be able to SLS x 5 seconds B to decrease pt risk of falling    Time 4   Period Weeks   PT LONG TERM GOAL #3   Title Pt mm strength to be increased to at least 4/ 5 to reduce risk of falling    Time 4   Period Weeks   PT LONG TERM GOAL #4   Title Pt knee pain to be no greater than a 2/10 throughout the day    Time 4   Period Weeks   PT LONG TERM  GOAL #5   Title Pt to be able to ambutlate 500 ft during a 6 minute walk test to improve stability on her feet.    Time 4   Period Weeks                Plan - 02/04/15 1409    Clinical Impression Statement PT with fall over the weekend.  Pt without injury other than bruising on upper lip and jaw.  Added sit to stand form large mat without using UE's.  Pt required verbal cues to complete in correct form.  Noted reduction in induration and adhesions today around knee.  Concluded session with manual today with verbal response of loosening and overall improvement.  Encouraged patient to continue use of RW outdoors at all times to ensure safety.   PT Next Visit Plan Progress standing and balance actvities to improve safety and reduce risk of falls.      X2811 CK G8980 CL   Problem List Patient Active Problem List   Diagnosis Date Noted  . S/P knee replacement 12/18/2014  . Osteoarthritis of right knee 12/18/2014  . IDA (iron deficiency anemia) 10/29/2013  . Lumbago 08/06/2013  . Difficulty in walking(719.7) 08/06/2013  . Bilateral leg weakness 09/23/2012  . Acute renal failure ([place]) 07/25/2012  . Dehydration 07/25/2012  . DM type 2, uncontrolled, with neuropathy ([place]) 07/25/2012  . DM (diabetes mellitus), type 2, uncontrolled ([place]) 07/25/2012  . Essential hypertension, benign 07/25/2012  . Generalized weakness 07/25/2012  . Hyperlipidemia   . Chronic kidney disease   . Skin cancer   . Hypertension   . GERD (gastroesophageal reflux disease)   . Breast cancer ([place]) 02/19/2012  . CAP (community acquired pneumonia) 04/19/2011  . Nausea and vomiting 04/19/2011  . HTN (hypertension) 04/19/2011  . DM type 2 (diabetes mellitus, type 2) ([place]) 04/19/2011  . Arthritis 04/19/2011    Teena Irani, PTA/CLT [phone]  02/04/2015, 2:18 PM  [facility] [address] [place], [place], [address] Phone: [phone]   Fax:  [phone]  Name: LATERIA ALDERMAN MRN: [id number] Date of Birth: [birth year]   PHYSICAL THERAPY DISCHARGE SUMMARY  Visits from Start of Care:  5  Current functional level related to goals / functional outcomes: Unknown pt did not return due to husband's illness Remaining deficits: unknown   Education / Equipment: HEP  Plan: Patient agrees to discharge.  Patient goals were not met. Patient is being discharged due to the patient's request.  ?????   .    Rayetta Humphrey, [place] CLT [phone]

## 2015-02-08 ENCOUNTER — Encounter (HOSPITAL_COMMUNITY): Payer: Self-pay | Admitting: Physical Therapy

## 2015-02-09 ENCOUNTER — Ambulatory Visit (HOSPITAL_COMMUNITY)
Admission: RE | Admit: 2015-02-09 | Discharge: 2015-02-09 | Disposition: A | Discharge status: Home / Self Care | Payer: Medicare Other | Source: Ambulatory Visit | Attending: Pulmonary Disease | Admitting: Pulmonary Disease

## 2015-02-09 DIAGNOSIS — Z9889 Other specified postprocedural states: Secondary | ICD-10-CM

## 2015-02-09 DIAGNOSIS — Z853 Personal history of malignant neoplasm of breast: Secondary | ICD-10-CM | POA: Insufficient documentation

## 2015-02-10 ENCOUNTER — Other Ambulatory Visit: Payer: Self-pay | Admitting: Neurosurgery

## 2015-02-10 DIAGNOSIS — M4120 Other idiopathic scoliosis, site unspecified: Secondary | ICD-10-CM

## 2015-02-17 DIAGNOSIS — D649 Anemia, unspecified: Secondary | ICD-10-CM

## 2015-02-17 DIAGNOSIS — M545 Low back pain, unspecified: Secondary | ICD-10-CM | POA: Insufficient documentation

## 2015-02-17 DIAGNOSIS — D6489 Other specified anemias: Secondary | ICD-10-CM | POA: Insufficient documentation

## 2015-02-17 DIAGNOSIS — H4920 Sixth [abducent] nerve palsy, unspecified eye: Secondary | ICD-10-CM | POA: Insufficient documentation

## 2015-02-17 DIAGNOSIS — G8929 Other chronic pain: Secondary | ICD-10-CM | POA: Insufficient documentation

## 2015-02-17 DIAGNOSIS — N289 Disorder of kidney and ureter, unspecified: Secondary | ICD-10-CM | POA: Insufficient documentation

## 2015-02-24 ENCOUNTER — Ambulatory Visit
Admission: RE | Admit: 2015-02-24 | Discharge: 2015-02-24 | Disposition: A | Discharge status: Home / Self Care | Payer: Medicare Other | Source: Ambulatory Visit | Attending: Neurosurgery | Admitting: Neurosurgery

## 2015-02-24 ENCOUNTER — Encounter (HOSPITAL_COMMUNITY): Payer: Self-pay | Admitting: Physical Therapy

## 2015-02-24 DIAGNOSIS — M4120 Other idiopathic scoliosis, site unspecified: Secondary | ICD-10-CM

## 2015-02-24 MED ORDER — GADOBENATE DIMEGLUMINE 529 MG/ML IV SOLN
8.0000 mL | Freq: Once | INTRAVENOUS | Status: AC | PRN
  Administered 2015-02-24: 8 mL via INTRAVENOUS

## 2015-02-25 ENCOUNTER — Ambulatory Visit (INDEPENDENT_AMBULATORY_CARE_PROVIDER_SITE_OTHER): Payer: Medicare Other | Admitting: Podiatry

## 2015-02-25 ENCOUNTER — Ambulatory Visit (INDEPENDENT_AMBULATORY_CARE_PROVIDER_SITE_OTHER): Payer: Medicare Other

## 2015-02-25 ENCOUNTER — Encounter: Payer: Self-pay | Admitting: Podiatry

## 2015-02-25 VITALS — BP 89/48 | HR 89 | Resp 16

## 2015-02-25 DIAGNOSIS — R600 Localized edema: Secondary | ICD-10-CM

## 2015-02-25 DIAGNOSIS — M79672 Pain in left foot: Secondary | ICD-10-CM

## 2015-02-25 DIAGNOSIS — M8430XD Stress fracture, unspecified site, subsequent encounter for fracture with routine healing: Secondary | ICD-10-CM | POA: Diagnosis not present

## 2015-02-25 NOTE — Progress Notes (Signed)
Subjective:     Patient ID: [name], female   DOB: Aug 24, 1933, 81 y.o.   MRN: [id number]  HPI patient presents with caregiver stating that she has a lot of pain on the plantar aspect of the left foot and also some pain in her left ankle and left lower leg   Review of Systems     Objective:   Physical Exam  neurovascular status is unchanged with quite a bit of forefoot edema left ankle edema with +1 pitting and discomfort when I palpated the posterior aspect of the left calf muscle    Assessment:      possibility for a venous clot versus possibility for stress fracture or edematous process    Plan:      educated her and caregiver on condition and today I did go ahead and I applied an Unna boot to try to reduce forefoot edema and we are sending her for a Doppler study to rule out a clot. She'll be seen back in the next several weeks and was given strict instructions if he should develop any increased pain in her leg or any shortness of breath she is to go straight to the emergency room

## 2015-02-26 ENCOUNTER — Ambulatory Visit (HOSPITAL_COMMUNITY)
Admission: RE | Admit: 2015-02-26 | Discharge: 2015-02-26 | Disposition: A | Discharge status: Home / Self Care | Payer: Medicare Other | Source: Ambulatory Visit | Attending: Cardiovascular Disease | Admitting: Cardiovascular Disease

## 2015-02-26 DIAGNOSIS — I129 Hypertensive chronic kidney disease with stage 1 through stage 4 chronic kidney disease, or unspecified chronic kidney disease: Secondary | ICD-10-CM | POA: Insufficient documentation

## 2015-02-26 DIAGNOSIS — E119 Type 2 diabetes mellitus without complications: Secondary | ICD-10-CM | POA: Diagnosis not present

## 2015-02-26 DIAGNOSIS — E785 Hyperlipidemia, unspecified: Secondary | ICD-10-CM | POA: Diagnosis not present

## 2015-02-26 DIAGNOSIS — R600 Localized edema: Secondary | ICD-10-CM

## 2015-02-26 DIAGNOSIS — N189 Chronic kidney disease, unspecified: Secondary | ICD-10-CM | POA: Diagnosis not present

## 2015-02-26 DIAGNOSIS — M79672 Pain in left foot: Secondary | ICD-10-CM | POA: Diagnosis not present

## 2015-03-02 ENCOUNTER — Ambulatory Visit (HOSPITAL_COMMUNITY): Payer: Medicare Other

## 2015-03-03 ENCOUNTER — Ambulatory Visit (HOSPITAL_COMMUNITY): Payer: Medicare Other

## 2015-03-04 ENCOUNTER — Ambulatory Visit (INDEPENDENT_AMBULATORY_CARE_PROVIDER_SITE_OTHER): Payer: Medicare Other | Admitting: Podiatry

## 2015-03-04 ENCOUNTER — Encounter: Payer: Self-pay | Admitting: Podiatry

## 2015-03-04 DIAGNOSIS — M79676 Pain in unspecified toe(s): Secondary | ICD-10-CM

## 2015-03-04 DIAGNOSIS — E1165 Type 2 diabetes mellitus with hyperglycemia: Secondary | ICD-10-CM

## 2015-03-04 DIAGNOSIS — B351 Tinea unguium: Secondary | ICD-10-CM | POA: Diagnosis not present

## 2015-03-04 DIAGNOSIS — E1140 Type 2 diabetes mellitus with diabetic neuropathy, unspecified: Secondary | ICD-10-CM | POA: Diagnosis not present

## 2015-03-04 DIAGNOSIS — M4120 Other idiopathic scoliosis, site unspecified: Secondary | ICD-10-CM | POA: Diagnosis not present

## 2015-03-04 DIAGNOSIS — M47816 Spondylosis without myelopathy or radiculopathy, lumbar region: Secondary | ICD-10-CM | POA: Diagnosis not present

## 2015-03-04 DIAGNOSIS — M545 Low back pain: Secondary | ICD-10-CM | POA: Diagnosis not present

## 2015-03-04 DIAGNOSIS — IMO0002 Reserved for concepts with insufficient information to code with codable children: Secondary | ICD-10-CM

## 2015-03-04 DIAGNOSIS — M5416 Radiculopathy, lumbar region: Secondary | ICD-10-CM | POA: Diagnosis not present

## 2015-03-04 NOTE — Progress Notes (Signed)
She presents today for follow-up of painful elongated toenails as well as a swelling to the dorsal aspect of her left foot. Last time she was in previous doctor performed blood work which was negative for blood clots. He placed her in a compression dressing which she left on until today.  Objective: Pulses are palpable bilateral. Nails are thick yellow dystrophic onychomycotic and painful. She has very little in way of reproducible pain across the dorsal aspect of the left foot.  Assessment: Metatarsalgia with edema left lower extremity. Painless segment onychomycosis.  Plan: Debridement of toenails 1 through 5 bilateral. Place her in a compression anklet left.

## 2015-03-09 ENCOUNTER — Telehealth (HOSPITAL_COMMUNITY): Payer: Self-pay | Admitting: Physical Therapy

## 2015-03-09 ENCOUNTER — Ambulatory Visit (HOSPITAL_COMMUNITY): Payer: Medicare Other | Attending: Pulmonary Disease | Admitting: Physical Therapy

## 2015-03-09 NOTE — Telephone Encounter (Signed)
Patient a no show for today's appointment. Called and spoke to patient, who reported that she was unable to leave her driveway due to snow/ice. She also reports that she is going to call back later to front desk to reschedule her appointment.  Deniece Ree PT, DPT [phone]

## 2015-03-15 DIAGNOSIS — Z96651 Presence of right artificial knee joint: Secondary | ICD-10-CM | POA: Diagnosis not present

## 2015-03-15 DIAGNOSIS — Z471 Aftercare following joint replacement surgery: Secondary | ICD-10-CM | POA: Diagnosis not present

## 2015-03-16 DIAGNOSIS — Z85828 Personal history of other malignant neoplasm of skin: Secondary | ICD-10-CM | POA: Diagnosis not present

## 2015-03-16 DIAGNOSIS — L821 Other seborrheic keratosis: Secondary | ICD-10-CM | POA: Diagnosis not present

## 2015-03-16 DIAGNOSIS — L570 Actinic keratosis: Secondary | ICD-10-CM | POA: Diagnosis not present

## 2015-03-23 DIAGNOSIS — M5440 Lumbago with sciatica, unspecified side: Secondary | ICD-10-CM | POA: Diagnosis not present

## 2015-03-23 DIAGNOSIS — K219 Gastro-esophageal reflux disease without esophagitis: Secondary | ICD-10-CM | POA: Diagnosis not present

## 2015-03-23 DIAGNOSIS — E119 Type 2 diabetes mellitus without complications: Secondary | ICD-10-CM | POA: Diagnosis not present

## 2015-03-23 DIAGNOSIS — I10 Essential (primary) hypertension: Secondary | ICD-10-CM | POA: Diagnosis not present

## 2015-03-24 DIAGNOSIS — Z794 Long term (current) use of insulin: Secondary | ICD-10-CM | POA: Diagnosis not present

## 2015-03-24 DIAGNOSIS — C50919 Malignant neoplasm of unspecified site of unspecified female breast: Secondary | ICD-10-CM | POA: Diagnosis not present

## 2015-03-24 DIAGNOSIS — E1122 Type 2 diabetes mellitus with diabetic chronic kidney disease: Secondary | ICD-10-CM | POA: Diagnosis not present

## 2015-03-24 DIAGNOSIS — N183 Chronic kidney disease, stage 3 (moderate): Secondary | ICD-10-CM | POA: Diagnosis not present

## 2015-03-24 DIAGNOSIS — N289 Disorder of kidney and ureter, unspecified: Secondary | ICD-10-CM | POA: Diagnosis not present

## 2015-03-24 DIAGNOSIS — D649 Anemia, unspecified: Secondary | ICD-10-CM | POA: Diagnosis not present

## 2015-03-24 DIAGNOSIS — D508 Other iron deficiency anemias: Secondary | ICD-10-CM | POA: Diagnosis not present

## 2015-04-06 ENCOUNTER — Telehealth (HOSPITAL_COMMUNITY): Payer: Self-pay

## 2015-04-06 NOTE — Telephone Encounter (Signed)
Patients husband is not feeling well and she need to go with him to the MD,

## 2015-04-07 ENCOUNTER — Ambulatory Visit (HOSPITAL_COMMUNITY): Payer: Medicare Other | Admitting: Physical Therapy

## 2015-04-14 DIAGNOSIS — R69 Illness, unspecified: Secondary | ICD-10-CM | POA: Diagnosis not present

## 2015-05-14 DIAGNOSIS — R609 Edema, unspecified: Secondary | ICD-10-CM | POA: Diagnosis not present

## 2015-05-14 DIAGNOSIS — R197 Diarrhea, unspecified: Secondary | ICD-10-CM | POA: Diagnosis not present

## 2015-05-14 DIAGNOSIS — E539 Vitamin B deficiency, unspecified: Secondary | ICD-10-CM | POA: Diagnosis not present

## 2015-05-19 ENCOUNTER — Telehealth: Payer: Self-pay | Admitting: *Deleted

## 2015-05-19 NOTE — Telephone Encounter (Signed)
"  Samanth Mirkin I have sad news.  Theadore Nan Joneen Boers) died on 05-07-22.  We have an appointment the 6th of April.  I wanted to call and make sure you take him off.  I also need to speak to someone in billing.  He had a stroke and it was sudden.  He was in the hospital a week.  He always loved you so much Gelsey Amyx.  Thank you for being a kind person that you always was to [place].  I'll talk to you later.  I have a hair appointment at 1 pm so I won't be here if you try to call."

## 2015-05-19 NOTE — Telephone Encounter (Signed)
Please make sure he is off the schedule so the computer will not call him. Thank you

## 2015-05-21 NOTE — Telephone Encounter (Signed)
I'm returning your call.  So sorry to hear about the passing of Mr. Inzer.  "He loved you [name].  I just wanted to let you know.  He thought so much of you.  You are always so pleasant when we come in.  He had a mini-stroke and was in the hospital for a week."  Thanks for letting me know.  I'm going to miss him.   "I have an appointment on April 6th.  I'll see you then."

## 2015-06-01 DIAGNOSIS — M47816 Spondylosis without myelopathy or radiculopathy, lumbar region: Secondary | ICD-10-CM | POA: Diagnosis not present

## 2015-06-03 ENCOUNTER — Encounter: Payer: Self-pay | Admitting: Podiatry

## 2015-06-03 ENCOUNTER — Ambulatory Visit (INDEPENDENT_AMBULATORY_CARE_PROVIDER_SITE_OTHER): Payer: Medicare HMO | Admitting: Podiatry

## 2015-06-03 DIAGNOSIS — B351 Tinea unguium: Secondary | ICD-10-CM | POA: Diagnosis not present

## 2015-06-03 DIAGNOSIS — IMO0002 Reserved for concepts with insufficient information to code with codable children: Secondary | ICD-10-CM

## 2015-06-03 DIAGNOSIS — E1165 Type 2 diabetes mellitus with hyperglycemia: Secondary | ICD-10-CM

## 2015-06-03 DIAGNOSIS — E1140 Type 2 diabetes mellitus with diabetic neuropathy, unspecified: Secondary | ICD-10-CM

## 2015-06-03 DIAGNOSIS — M79676 Pain in unspecified toe(s): Secondary | ICD-10-CM

## 2015-06-03 NOTE — Progress Notes (Signed)
Rene presents today with a chief complaint of a painful elongated toenails bilateral.  Objective: Vital signs stable alert and oriented 3. Pulses are strongly palpable. Toenails are thick yellow dystrophic onychomycotic and painful on palpation.  Assessment: Pain in limb secondary to onychomycosis.  Plan: Debridement of toenails 1 through 5 bilateral.

## 2015-06-07 DIAGNOSIS — Z96651 Presence of right artificial knee joint: Secondary | ICD-10-CM | POA: Diagnosis not present

## 2015-06-07 DIAGNOSIS — Z471 Aftercare following joint replacement surgery: Secondary | ICD-10-CM | POA: Diagnosis not present

## 2015-06-09 ENCOUNTER — Telehealth: Payer: Self-pay | Admitting: *Deleted

## 2015-06-09 DIAGNOSIS — Z961 Presence of intraocular lens: Secondary | ICD-10-CM | POA: Diagnosis not present

## 2015-06-09 DIAGNOSIS — E113293 Type 2 diabetes mellitus with mild nonproliferative diabetic retinopathy without macular edema, bilateral: Secondary | ICD-10-CM | POA: Diagnosis not present

## 2015-06-09 DIAGNOSIS — H401132 Primary open-angle glaucoma, bilateral, moderate stage: Secondary | ICD-10-CM | POA: Diagnosis not present

## 2015-06-09 DIAGNOSIS — H40013 Open angle with borderline findings, low risk, bilateral: Secondary | ICD-10-CM | POA: Diagnosis not present

## 2015-06-09 NOTE — Telephone Encounter (Signed)
Pt states she would like a listing of her 2016 cost, and the date of her next appt.  Pt states it is okay to leave a message.  I left message informing pt he next appt is 09/02/2015 at 1100am with Dr. Milinda Pointer and I will have the receptionist call with the amount she request.

## 2015-06-16 DIAGNOSIS — E538 Deficiency of other specified B group vitamins: Secondary | ICD-10-CM | POA: Diagnosis not present

## 2015-06-22 DIAGNOSIS — R69 Illness, unspecified: Secondary | ICD-10-CM | POA: Diagnosis not present

## 2015-07-01 DIAGNOSIS — E119 Type 2 diabetes mellitus without complications: Secondary | ICD-10-CM | POA: Diagnosis not present

## 2015-07-01 DIAGNOSIS — M5440 Lumbago with sciatica, unspecified side: Secondary | ICD-10-CM | POA: Diagnosis not present

## 2015-07-01 DIAGNOSIS — K219 Gastro-esophageal reflux disease without esophagitis: Secondary | ICD-10-CM | POA: Diagnosis not present

## 2015-07-01 DIAGNOSIS — I10 Essential (primary) hypertension: Secondary | ICD-10-CM | POA: Diagnosis not present

## 2015-07-09 DIAGNOSIS — M5440 Lumbago with sciatica, unspecified side: Secondary | ICD-10-CM | POA: Diagnosis not present

## 2015-07-09 DIAGNOSIS — K219 Gastro-esophageal reflux disease without esophagitis: Secondary | ICD-10-CM | POA: Diagnosis not present

## 2015-07-09 DIAGNOSIS — E119 Type 2 diabetes mellitus without complications: Secondary | ICD-10-CM | POA: Diagnosis not present

## 2015-07-09 DIAGNOSIS — I10 Essential (primary) hypertension: Secondary | ICD-10-CM | POA: Diagnosis not present

## 2015-07-20 DIAGNOSIS — E538 Deficiency of other specified B group vitamins: Secondary | ICD-10-CM | POA: Diagnosis not present

## 2015-07-21 DIAGNOSIS — Z961 Presence of intraocular lens: Secondary | ICD-10-CM | POA: Diagnosis not present

## 2015-07-21 DIAGNOSIS — H401132 Primary open-angle glaucoma, bilateral, moderate stage: Secondary | ICD-10-CM | POA: Diagnosis not present

## 2015-08-04 DIAGNOSIS — N183 Chronic kidney disease, stage 3 (moderate): Secondary | ICD-10-CM | POA: Diagnosis not present

## 2015-08-04 DIAGNOSIS — E1165 Type 2 diabetes mellitus with hyperglycemia: Secondary | ICD-10-CM | POA: Diagnosis not present

## 2015-08-04 DIAGNOSIS — I129 Hypertensive chronic kidney disease with stage 1 through stage 4 chronic kidney disease, or unspecified chronic kidney disease: Secondary | ICD-10-CM | POA: Diagnosis not present

## 2015-08-04 DIAGNOSIS — I10 Essential (primary) hypertension: Secondary | ICD-10-CM | POA: Diagnosis not present

## 2015-08-26 DIAGNOSIS — R69 Illness, unspecified: Secondary | ICD-10-CM | POA: Diagnosis not present

## 2015-08-26 DIAGNOSIS — E538 Deficiency of other specified B group vitamins: Secondary | ICD-10-CM | POA: Diagnosis not present

## 2015-09-01 ENCOUNTER — Telehealth: Payer: Self-pay | Admitting: *Deleted

## 2015-09-01 NOTE — Telephone Encounter (Signed)
"  [name], I have an appointment on tomorrow to have my toenails trimmed.  Do I need to take my nail polish off?"  Yes, it is best that you take it off.  "Okay, I thought so.  I hope you had a good 4th of July."  Yes, I didn't hope yours was good as well.  "It's okay.  Theadore Nan and I would have celebrated 56 years of marriage.  I miss him, we had a good marriage."  I know you do.  Take care of yourself.  "I'll see you tomorrow."

## 2015-09-02 ENCOUNTER — Encounter: Payer: Self-pay | Admitting: Podiatry

## 2015-09-02 ENCOUNTER — Ambulatory Visit (INDEPENDENT_AMBULATORY_CARE_PROVIDER_SITE_OTHER): Payer: Medicare HMO | Admitting: Podiatry

## 2015-09-02 DIAGNOSIS — E1165 Type 2 diabetes mellitus with hyperglycemia: Secondary | ICD-10-CM

## 2015-09-02 DIAGNOSIS — E1140 Type 2 diabetes mellitus with diabetic neuropathy, unspecified: Secondary | ICD-10-CM | POA: Diagnosis not present

## 2015-09-02 DIAGNOSIS — M79676 Pain in unspecified toe(s): Secondary | ICD-10-CM

## 2015-09-02 DIAGNOSIS — B351 Tinea unguium: Secondary | ICD-10-CM | POA: Diagnosis not present

## 2015-09-02 DIAGNOSIS — IMO0002 Reserved for concepts with insufficient information to code with codable children: Secondary | ICD-10-CM

## 2015-09-02 DIAGNOSIS — E119 Type 2 diabetes mellitus without complications: Secondary | ICD-10-CM | POA: Insufficient documentation

## 2015-09-02 NOTE — Progress Notes (Signed)
She presents today for a chief complaint painful elongated toenails bilateral.  Objective: Vital signs are stable alert and oriented 3. Toenails are thick yellow dystrophic with mycotic and painful palpation as well as debridement.  Assessment: Pain in limb secondary to onychomycosis.  Plan: Discussed etiology pathology conservative or surgical therapies. Debrided toenails 1 through 5 bilateral.

## 2015-09-08 DIAGNOSIS — L570 Actinic keratosis: Secondary | ICD-10-CM | POA: Diagnosis not present

## 2015-09-08 DIAGNOSIS — D692 Other nonthrombocytopenic purpura: Secondary | ICD-10-CM | POA: Diagnosis not present

## 2015-09-08 DIAGNOSIS — I8312 Varicose veins of left lower extremity with inflammation: Secondary | ICD-10-CM | POA: Diagnosis not present

## 2015-09-08 DIAGNOSIS — Z85828 Personal history of other malignant neoplasm of skin: Secondary | ICD-10-CM | POA: Diagnosis not present

## 2015-09-08 DIAGNOSIS — I8311 Varicose veins of right lower extremity with inflammation: Secondary | ICD-10-CM | POA: Diagnosis not present

## 2015-09-08 DIAGNOSIS — D225 Melanocytic nevi of trunk: Secondary | ICD-10-CM | POA: Diagnosis not present

## 2015-09-08 DIAGNOSIS — L821 Other seborrheic keratosis: Secondary | ICD-10-CM | POA: Diagnosis not present

## 2015-09-08 DIAGNOSIS — I872 Venous insufficiency (chronic) (peripheral): Secondary | ICD-10-CM | POA: Diagnosis not present

## 2015-09-29 DIAGNOSIS — E538 Deficiency of other specified B group vitamins: Secondary | ICD-10-CM | POA: Diagnosis not present

## 2015-09-29 DIAGNOSIS — I129 Hypertensive chronic kidney disease with stage 1 through stage 4 chronic kidney disease, or unspecified chronic kidney disease: Secondary | ICD-10-CM | POA: Diagnosis not present

## 2015-09-29 DIAGNOSIS — E1165 Type 2 diabetes mellitus with hyperglycemia: Secondary | ICD-10-CM | POA: Diagnosis not present

## 2015-09-29 DIAGNOSIS — I10 Essential (primary) hypertension: Secondary | ICD-10-CM | POA: Diagnosis not present

## 2015-09-29 DIAGNOSIS — K219 Gastro-esophageal reflux disease without esophagitis: Secondary | ICD-10-CM | POA: Diagnosis not present

## 2015-11-10 DIAGNOSIS — E538 Deficiency of other specified B group vitamins: Secondary | ICD-10-CM | POA: Diagnosis not present

## 2015-11-18 DIAGNOSIS — R69 Illness, unspecified: Secondary | ICD-10-CM | POA: Diagnosis not present

## 2015-11-23 DIAGNOSIS — Z961 Presence of intraocular lens: Secondary | ICD-10-CM | POA: Diagnosis not present

## 2015-11-23 DIAGNOSIS — E113293 Type 2 diabetes mellitus with mild nonproliferative diabetic retinopathy without macular edema, bilateral: Secondary | ICD-10-CM | POA: Diagnosis not present

## 2015-11-23 DIAGNOSIS — H401132 Primary open-angle glaucoma, bilateral, moderate stage: Secondary | ICD-10-CM | POA: Diagnosis not present

## 2015-12-02 ENCOUNTER — Ambulatory Visit (INDEPENDENT_AMBULATORY_CARE_PROVIDER_SITE_OTHER): Payer: Medicare HMO | Admitting: Podiatry

## 2015-12-02 ENCOUNTER — Encounter: Payer: Self-pay | Admitting: Podiatry

## 2015-12-02 DIAGNOSIS — B351 Tinea unguium: Secondary | ICD-10-CM | POA: Diagnosis not present

## 2015-12-02 DIAGNOSIS — E1140 Type 2 diabetes mellitus with diabetic neuropathy, unspecified: Secondary | ICD-10-CM

## 2015-12-02 DIAGNOSIS — E1165 Type 2 diabetes mellitus with hyperglycemia: Secondary | ICD-10-CM

## 2015-12-02 DIAGNOSIS — IMO0002 Reserved for concepts with insufficient information to code with codable children: Secondary | ICD-10-CM

## 2015-12-02 DIAGNOSIS — M79676 Pain in unspecified toe(s): Secondary | ICD-10-CM | POA: Diagnosis not present

## 2015-12-02 NOTE — Progress Notes (Signed)
She presents today with a chief complaint of painful elongated toenails. She is also complaining of burning at nighttime in her feet which is associated with her diabetes she says.  Objective: Pulses remain palpable bilateral. No significant change in neurologic evaluation. Toenails are thick yellow dystrophic onychomycotic severely elongated.  Assessment: Diabetes mellitus with diabetic peripheral neuropathy and pain in limb secondary to onychomycosis.  Plan: Debridement toenails bilateral. I suggested that she follow-up with her primary doctor to increase her gabapentin at nighttime. (3 months

## 2015-12-29 DIAGNOSIS — L570 Actinic keratosis: Secondary | ICD-10-CM | POA: Diagnosis not present

## 2015-12-29 DIAGNOSIS — L821 Other seborrheic keratosis: Secondary | ICD-10-CM | POA: Diagnosis not present

## 2015-12-29 DIAGNOSIS — Z85828 Personal history of other malignant neoplasm of skin: Secondary | ICD-10-CM | POA: Diagnosis not present

## 2015-12-31 ENCOUNTER — Other Ambulatory Visit: Payer: Self-pay | Admitting: Pulmonary Disease

## 2015-12-31 DIAGNOSIS — Z853 Personal history of malignant neoplasm of breast: Secondary | ICD-10-CM

## 2016-01-04 DIAGNOSIS — E1165 Type 2 diabetes mellitus with hyperglycemia: Secondary | ICD-10-CM | POA: Diagnosis not present

## 2016-01-04 DIAGNOSIS — Z23 Encounter for immunization: Secondary | ICD-10-CM | POA: Diagnosis not present

## 2016-01-04 DIAGNOSIS — N183 Chronic kidney disease, stage 3 (moderate): Secondary | ICD-10-CM | POA: Diagnosis not present

## 2016-01-04 DIAGNOSIS — I10 Essential (primary) hypertension: Secondary | ICD-10-CM | POA: Diagnosis not present

## 2016-01-04 DIAGNOSIS — M545 Low back pain: Secondary | ICD-10-CM | POA: Diagnosis not present

## 2016-01-10 ENCOUNTER — Other Ambulatory Visit (HOSPITAL_COMMUNITY): Payer: Self-pay | Admitting: Pulmonary Disease

## 2016-01-10 DIAGNOSIS — Z853 Personal history of malignant neoplasm of breast: Secondary | ICD-10-CM

## 2016-01-13 DIAGNOSIS — N183 Chronic kidney disease, stage 3 (moderate): Secondary | ICD-10-CM | POA: Diagnosis not present

## 2016-01-13 DIAGNOSIS — I10 Essential (primary) hypertension: Secondary | ICD-10-CM | POA: Diagnosis not present

## 2016-01-13 DIAGNOSIS — E1165 Type 2 diabetes mellitus with hyperglycemia: Secondary | ICD-10-CM | POA: Diagnosis not present

## 2016-01-13 DIAGNOSIS — M545 Low back pain: Secondary | ICD-10-CM | POA: Diagnosis not present

## 2016-02-10 ENCOUNTER — Encounter (HOSPITAL_COMMUNITY): Payer: Self-pay

## 2016-02-15 ENCOUNTER — Ambulatory Visit (HOSPITAL_COMMUNITY)
Admission: RE | Admit: 2016-02-15 | Discharge: 2016-02-15 | Disposition: A | Discharge status: Home / Self Care | Payer: Medicare HMO | Source: Ambulatory Visit | Attending: Pulmonary Disease | Admitting: Pulmonary Disease

## 2016-02-15 DIAGNOSIS — Z853 Personal history of malignant neoplasm of breast: Secondary | ICD-10-CM | POA: Diagnosis not present

## 2016-02-15 DIAGNOSIS — R928 Other abnormal and inconclusive findings on diagnostic imaging of breast: Secondary | ICD-10-CM | POA: Diagnosis not present

## 2016-03-01 DIAGNOSIS — R69 Illness, unspecified: Secondary | ICD-10-CM | POA: Diagnosis not present

## 2016-03-09 ENCOUNTER — Ambulatory Visit (INDEPENDENT_AMBULATORY_CARE_PROVIDER_SITE_OTHER): Payer: Medicare HMO | Admitting: Podiatry

## 2016-03-09 ENCOUNTER — Encounter: Payer: Self-pay | Admitting: Podiatry

## 2016-03-09 DIAGNOSIS — M79676 Pain in unspecified toe(s): Secondary | ICD-10-CM

## 2016-03-09 DIAGNOSIS — E1140 Type 2 diabetes mellitus with diabetic neuropathy, unspecified: Secondary | ICD-10-CM

## 2016-03-09 DIAGNOSIS — E1165 Type 2 diabetes mellitus with hyperglycemia: Secondary | ICD-10-CM

## 2016-03-09 DIAGNOSIS — IMO0002 Reserved for concepts with insufficient information to code with codable children: Secondary | ICD-10-CM

## 2016-03-09 DIAGNOSIS — B351 Tinea unguium: Secondary | ICD-10-CM

## 2016-03-09 NOTE — Progress Notes (Signed)
She presents today with a chief complaint of painful elongated toenails bilateral.  Objective: Pulses are palpable no open lesions or wounds. Toenails are thick yellow dystrophic onychomycotic and painful on palpation.  Assessment: Pain in limb secondary to onychomycosis.  Plan: Debridement of toenails 1 through 5 bilateral.

## 2016-03-21 DIAGNOSIS — Z961 Presence of intraocular lens: Secondary | ICD-10-CM | POA: Diagnosis not present

## 2016-03-21 DIAGNOSIS — H3523 Other non-diabetic proliferative retinopathy, bilateral: Secondary | ICD-10-CM | POA: Diagnosis not present

## 2016-03-21 DIAGNOSIS — H401132 Primary open-angle glaucoma, bilateral, moderate stage: Secondary | ICD-10-CM | POA: Diagnosis not present

## 2016-03-21 DIAGNOSIS — E113293 Type 2 diabetes mellitus with mild nonproliferative diabetic retinopathy without macular edema, bilateral: Secondary | ICD-10-CM | POA: Diagnosis not present

## 2016-03-27 DIAGNOSIS — M5440 Lumbago with sciatica, unspecified side: Secondary | ICD-10-CM | POA: Diagnosis not present

## 2016-03-27 DIAGNOSIS — G629 Polyneuropathy, unspecified: Secondary | ICD-10-CM | POA: Diagnosis not present

## 2016-03-27 DIAGNOSIS — I10 Essential (primary) hypertension: Secondary | ICD-10-CM | POA: Diagnosis not present

## 2016-03-27 DIAGNOSIS — N183 Chronic kidney disease, stage 3 (moderate): Secondary | ICD-10-CM | POA: Diagnosis not present

## 2016-03-27 DIAGNOSIS — E538 Deficiency of other specified B group vitamins: Secondary | ICD-10-CM | POA: Diagnosis not present

## 2016-03-27 DIAGNOSIS — R0602 Shortness of breath: Secondary | ICD-10-CM | POA: Diagnosis not present

## 2016-04-05 DIAGNOSIS — I10 Essential (primary) hypertension: Secondary | ICD-10-CM | POA: Diagnosis not present

## 2016-04-06 DIAGNOSIS — R69 Illness, unspecified: Secondary | ICD-10-CM | POA: Diagnosis not present

## 2016-04-28 ENCOUNTER — Ambulatory Visit (HOSPITAL_COMMUNITY)
Admission: RE | Admit: 2016-04-28 | Discharge: 2016-04-28 | Disposition: A | Discharge status: Home / Self Care | Payer: Medicare HMO | Source: Ambulatory Visit | Attending: Pulmonary Disease | Admitting: Pulmonary Disease

## 2016-04-28 ENCOUNTER — Other Ambulatory Visit (HOSPITAL_COMMUNITY): Payer: Self-pay | Admitting: Pulmonary Disease

## 2016-04-28 DIAGNOSIS — R0602 Shortness of breath: Secondary | ICD-10-CM

## 2016-04-29 DIAGNOSIS — Z0000 Encounter for general adult medical examination without abnormal findings: Secondary | ICD-10-CM | POA: Diagnosis not present

## 2016-04-29 DIAGNOSIS — Z7982 Long term (current) use of aspirin: Secondary | ICD-10-CM | POA: Diagnosis not present

## 2016-04-29 DIAGNOSIS — D0592 Unspecified type of carcinoma in situ of left breast: Secondary | ICD-10-CM | POA: Diagnosis not present

## 2016-04-29 DIAGNOSIS — E7800 Pure hypercholesterolemia, unspecified: Secondary | ICD-10-CM | POA: Diagnosis not present

## 2016-04-29 DIAGNOSIS — I10 Essential (primary) hypertension: Secondary | ICD-10-CM | POA: Diagnosis not present

## 2016-04-29 DIAGNOSIS — Z791 Long term (current) use of non-steroidal anti-inflammatories (NSAID): Secondary | ICD-10-CM | POA: Diagnosis not present

## 2016-04-29 DIAGNOSIS — Z79891 Long term (current) use of opiate analgesic: Secondary | ICD-10-CM | POA: Diagnosis not present

## 2016-04-29 DIAGNOSIS — M47816 Spondylosis without myelopathy or radiculopathy, lumbar region: Secondary | ICD-10-CM | POA: Diagnosis not present

## 2016-04-29 DIAGNOSIS — Z794 Long term (current) use of insulin: Secondary | ICD-10-CM | POA: Diagnosis not present

## 2016-04-29 DIAGNOSIS — H409 Unspecified glaucoma: Secondary | ICD-10-CM | POA: Diagnosis not present

## 2016-04-29 DIAGNOSIS — R600 Localized edema: Secondary | ICD-10-CM | POA: Diagnosis not present

## 2016-04-29 DIAGNOSIS — H9113 Presbycusis, bilateral: Secondary | ICD-10-CM | POA: Diagnosis not present

## 2016-04-29 DIAGNOSIS — Z79811 Long term (current) use of aromatase inhibitors: Secondary | ICD-10-CM | POA: Diagnosis not present

## 2016-04-29 DIAGNOSIS — K219 Gastro-esophageal reflux disease without esophagitis: Secondary | ICD-10-CM | POA: Diagnosis not present

## 2016-04-29 DIAGNOSIS — D518 Other vitamin B12 deficiency anemias: Secondary | ICD-10-CM | POA: Diagnosis not present

## 2016-04-29 DIAGNOSIS — E1142 Type 2 diabetes mellitus with diabetic polyneuropathy: Secondary | ICD-10-CM | POA: Diagnosis not present

## 2016-05-17 DIAGNOSIS — R69 Illness, unspecified: Secondary | ICD-10-CM | POA: Diagnosis not present

## 2016-05-19 DIAGNOSIS — E538 Deficiency of other specified B group vitamins: Secondary | ICD-10-CM | POA: Diagnosis not present

## 2016-05-25 DIAGNOSIS — E1165 Type 2 diabetes mellitus with hyperglycemia: Secondary | ICD-10-CM | POA: Diagnosis not present

## 2016-05-25 DIAGNOSIS — M545 Low back pain: Secondary | ICD-10-CM | POA: Diagnosis not present

## 2016-05-25 DIAGNOSIS — I10 Essential (primary) hypertension: Secondary | ICD-10-CM | POA: Diagnosis not present

## 2016-05-25 DIAGNOSIS — K210 Gastro-esophageal reflux disease with esophagitis: Secondary | ICD-10-CM | POA: Diagnosis not present

## 2016-05-25 DIAGNOSIS — N183 Chronic kidney disease, stage 3 (moderate): Secondary | ICD-10-CM | POA: Diagnosis not present

## 2016-06-08 ENCOUNTER — Encounter: Payer: Self-pay | Admitting: Podiatry

## 2016-06-08 ENCOUNTER — Ambulatory Visit (INDEPENDENT_AMBULATORY_CARE_PROVIDER_SITE_OTHER): Payer: Medicare HMO | Admitting: Podiatry

## 2016-06-08 DIAGNOSIS — B351 Tinea unguium: Secondary | ICD-10-CM | POA: Diagnosis not present

## 2016-06-08 DIAGNOSIS — E1140 Type 2 diabetes mellitus with diabetic neuropathy, unspecified: Secondary | ICD-10-CM

## 2016-06-08 DIAGNOSIS — IMO0002 Reserved for concepts with insufficient information to code with codable children: Secondary | ICD-10-CM

## 2016-06-08 DIAGNOSIS — E1165 Type 2 diabetes mellitus with hyperglycemia: Secondary | ICD-10-CM

## 2016-06-08 DIAGNOSIS — M79676 Pain in unspecified toe(s): Secondary | ICD-10-CM | POA: Diagnosis not present

## 2016-06-11 NOTE — Progress Notes (Signed)
She presents today chief complaint of painful elongated toenails.  Objective: Vital signs are stable she is alert and oriented 3. Toenails are long thick yellow dystrophic with mycotic painful on palpation as well as debridement.  Assessment: Pain in limb sigmoid onychomycosis.  Plan: Debridement of toenails 1 through 5 bilateral.

## 2016-06-26 DIAGNOSIS — Z85828 Personal history of other malignant neoplasm of skin: Secondary | ICD-10-CM | POA: Diagnosis not present

## 2016-06-26 DIAGNOSIS — I8312 Varicose veins of left lower extremity with inflammation: Secondary | ICD-10-CM | POA: Diagnosis not present

## 2016-06-26 DIAGNOSIS — D692 Other nonthrombocytopenic purpura: Secondary | ICD-10-CM | POA: Diagnosis not present

## 2016-06-26 DIAGNOSIS — I8311 Varicose veins of right lower extremity with inflammation: Secondary | ICD-10-CM | POA: Diagnosis not present

## 2016-06-26 DIAGNOSIS — I872 Venous insufficiency (chronic) (peripheral): Secondary | ICD-10-CM | POA: Diagnosis not present

## 2016-06-26 DIAGNOSIS — D225 Melanocytic nevi of trunk: Secondary | ICD-10-CM | POA: Diagnosis not present

## 2016-06-26 DIAGNOSIS — L820 Inflamed seborrheic keratosis: Secondary | ICD-10-CM | POA: Diagnosis not present

## 2016-06-26 DIAGNOSIS — L821 Other seborrheic keratosis: Secondary | ICD-10-CM | POA: Diagnosis not present

## 2016-07-07 DIAGNOSIS — R69 Illness, unspecified: Secondary | ICD-10-CM | POA: Diagnosis not present

## 2016-07-07 DIAGNOSIS — E538 Deficiency of other specified B group vitamins: Secondary | ICD-10-CM | POA: Diagnosis not present

## 2016-07-13 DIAGNOSIS — R69 Illness, unspecified: Secondary | ICD-10-CM | POA: Diagnosis not present

## 2016-07-17 DIAGNOSIS — Z961 Presence of intraocular lens: Secondary | ICD-10-CM | POA: Diagnosis not present

## 2016-07-17 DIAGNOSIS — E113293 Type 2 diabetes mellitus with mild nonproliferative diabetic retinopathy without macular edema, bilateral: Secondary | ICD-10-CM | POA: Diagnosis not present

## 2016-07-17 DIAGNOSIS — H401132 Primary open-angle glaucoma, bilateral, moderate stage: Secondary | ICD-10-CM | POA: Diagnosis not present

## 2016-07-27 DIAGNOSIS — I10 Essential (primary) hypertension: Secondary | ICD-10-CM | POA: Diagnosis not present

## 2016-07-27 DIAGNOSIS — N183 Chronic kidney disease, stage 3 (moderate): Secondary | ICD-10-CM | POA: Diagnosis not present

## 2016-07-27 DIAGNOSIS — E1165 Type 2 diabetes mellitus with hyperglycemia: Secondary | ICD-10-CM | POA: Diagnosis not present

## 2016-07-27 DIAGNOSIS — M545 Low back pain: Secondary | ICD-10-CM | POA: Diagnosis not present

## 2016-08-17 ENCOUNTER — Encounter: Payer: Self-pay | Admitting: Podiatry

## 2016-08-17 ENCOUNTER — Ambulatory Visit (INDEPENDENT_AMBULATORY_CARE_PROVIDER_SITE_OTHER): Payer: Medicare HMO | Admitting: Podiatry

## 2016-08-17 DIAGNOSIS — B351 Tinea unguium: Secondary | ICD-10-CM | POA: Diagnosis not present

## 2016-08-17 DIAGNOSIS — M79676 Pain in unspecified toe(s): Secondary | ICD-10-CM

## 2016-08-17 DIAGNOSIS — IMO0002 Reserved for concepts with insufficient information to code with codable children: Secondary | ICD-10-CM

## 2016-08-17 DIAGNOSIS — E1165 Type 2 diabetes mellitus with hyperglycemia: Secondary | ICD-10-CM

## 2016-08-17 DIAGNOSIS — E1140 Type 2 diabetes mellitus with diabetic neuropathy, unspecified: Secondary | ICD-10-CM

## 2016-08-17 NOTE — Progress Notes (Signed)
She presents today she complained of painful elongated toenails.  Objective: Neurologic sensorium is intact. Pulses are palpable. Tenderness along the Achilles dystrophic onychomycotic and painful palpation.  Assessment: Pain limb segment onychomycosis.  Plan: Debridement of toenails 1 through 5 bilateral.

## 2016-08-28 DIAGNOSIS — E113293 Type 2 diabetes mellitus with mild nonproliferative diabetic retinopathy without macular edema, bilateral: Secondary | ICD-10-CM | POA: Diagnosis not present

## 2016-08-28 DIAGNOSIS — Z961 Presence of intraocular lens: Secondary | ICD-10-CM | POA: Diagnosis not present

## 2016-08-28 DIAGNOSIS — H401132 Primary open-angle glaucoma, bilateral, moderate stage: Secondary | ICD-10-CM | POA: Diagnosis not present

## 2016-08-30 DIAGNOSIS — R69 Illness, unspecified: Secondary | ICD-10-CM | POA: Diagnosis not present

## 2016-09-11 DIAGNOSIS — E1165 Type 2 diabetes mellitus with hyperglycemia: Secondary | ICD-10-CM | POA: Diagnosis not present

## 2016-09-11 DIAGNOSIS — N183 Chronic kidney disease, stage 3 (moderate): Secondary | ICD-10-CM | POA: Diagnosis not present

## 2016-09-11 DIAGNOSIS — M545 Low back pain: Secondary | ICD-10-CM | POA: Diagnosis not present

## 2016-09-11 DIAGNOSIS — I10 Essential (primary) hypertension: Secondary | ICD-10-CM | POA: Diagnosis not present

## 2016-09-20 DIAGNOSIS — M25521 Pain in right elbow: Secondary | ICD-10-CM | POA: Diagnosis not present

## 2016-09-20 DIAGNOSIS — Z0000 Encounter for general adult medical examination without abnormal findings: Secondary | ICD-10-CM | POA: Diagnosis not present

## 2016-09-25 DIAGNOSIS — R69 Illness, unspecified: Secondary | ICD-10-CM | POA: Diagnosis not present

## 2016-10-03 DIAGNOSIS — E875 Hyperkalemia: Secondary | ICD-10-CM | POA: Diagnosis not present

## 2016-10-09 ENCOUNTER — Ambulatory Visit (HOSPITAL_COMMUNITY)
Admission: RE | Admit: 2016-10-09 | Discharge: 2016-10-09 | Disposition: A | Discharge status: Home / Self Care | Payer: Medicare HMO | Source: Ambulatory Visit | Attending: Pulmonary Disease | Admitting: Pulmonary Disease

## 2016-10-09 ENCOUNTER — Other Ambulatory Visit (HOSPITAL_COMMUNITY): Payer: Self-pay | Admitting: Pulmonary Disease

## 2016-10-09 DIAGNOSIS — S5001XA Contusion of right elbow, initial encounter: Secondary | ICD-10-CM | POA: Diagnosis not present

## 2016-10-09 DIAGNOSIS — K210 Gastro-esophageal reflux disease with esophagitis: Secondary | ICD-10-CM | POA: Diagnosis not present

## 2016-10-09 DIAGNOSIS — M25521 Pain in right elbow: Secondary | ICD-10-CM

## 2016-10-09 DIAGNOSIS — N19 Unspecified kidney failure: Secondary | ICD-10-CM | POA: Diagnosis not present

## 2016-10-09 DIAGNOSIS — I10 Essential (primary) hypertension: Secondary | ICD-10-CM | POA: Diagnosis not present

## 2016-10-09 DIAGNOSIS — E1165 Type 2 diabetes mellitus with hyperglycemia: Secondary | ICD-10-CM | POA: Diagnosis not present

## 2016-10-17 ENCOUNTER — Encounter: Payer: Self-pay | Admitting: Podiatry

## 2016-10-17 ENCOUNTER — Ambulatory Visit (INDEPENDENT_AMBULATORY_CARE_PROVIDER_SITE_OTHER): Payer: Medicare HMO | Admitting: Podiatry

## 2016-10-17 DIAGNOSIS — B351 Tinea unguium: Secondary | ICD-10-CM

## 2016-10-17 DIAGNOSIS — M79676 Pain in unspecified toe(s): Secondary | ICD-10-CM

## 2016-10-17 NOTE — Progress Notes (Signed)
She presents today for chief complaint of painful elongated toenails.  Objective: Toenails are long thick yellow dystrophic onychomycotic pulses are palpable. No open lesions or wounds are noted.  Assessment: Pain in limb secondary to onychomycosis.  Plan: Debridement of toenails 1 through 5 bilateral.

## 2016-11-06 DIAGNOSIS — E538 Deficiency of other specified B group vitamins: Secondary | ICD-10-CM | POA: Diagnosis not present

## 2016-11-24 DIAGNOSIS — R69 Illness, unspecified: Secondary | ICD-10-CM | POA: Diagnosis not present

## 2016-12-06 ENCOUNTER — Other Ambulatory Visit (HOSPITAL_COMMUNITY): Payer: Self-pay | Admitting: Pulmonary Disease

## 2016-12-06 DIAGNOSIS — Z9889 Other specified postprocedural states: Secondary | ICD-10-CM

## 2016-12-26 ENCOUNTER — Ambulatory Visit: Payer: Medicare HMO | Admitting: Podiatry

## 2016-12-26 DIAGNOSIS — M545 Low back pain: Secondary | ICD-10-CM | POA: Diagnosis not present

## 2016-12-26 DIAGNOSIS — Z23 Encounter for immunization: Secondary | ICD-10-CM | POA: Diagnosis not present

## 2016-12-26 DIAGNOSIS — N183 Chronic kidney disease, stage 3 (moderate): Secondary | ICD-10-CM | POA: Diagnosis not present

## 2016-12-26 DIAGNOSIS — E1165 Type 2 diabetes mellitus with hyperglycemia: Secondary | ICD-10-CM | POA: Diagnosis not present

## 2016-12-26 DIAGNOSIS — I10 Essential (primary) hypertension: Secondary | ICD-10-CM | POA: Diagnosis not present

## 2016-12-28 ENCOUNTER — Ambulatory Visit (INDEPENDENT_AMBULATORY_CARE_PROVIDER_SITE_OTHER): Payer: Medicare HMO | Admitting: Podiatry

## 2016-12-28 ENCOUNTER — Encounter: Payer: Self-pay | Admitting: Podiatry

## 2016-12-28 DIAGNOSIS — B351 Tinea unguium: Secondary | ICD-10-CM | POA: Diagnosis not present

## 2016-12-28 DIAGNOSIS — E1140 Type 2 diabetes mellitus with diabetic neuropathy, unspecified: Secondary | ICD-10-CM | POA: Diagnosis not present

## 2016-12-28 DIAGNOSIS — M79676 Pain in unspecified toe(s): Secondary | ICD-10-CM

## 2016-12-28 DIAGNOSIS — E1165 Type 2 diabetes mellitus with hyperglycemia: Secondary | ICD-10-CM

## 2016-12-28 DIAGNOSIS — IMO0002 Reserved for concepts with insufficient information to code with codable children: Secondary | ICD-10-CM

## 2016-12-30 NOTE — Progress Notes (Signed)
She presents today chief complaint of painful elongated toenails bilaterally.  Objective: Pulses are palpable. Toenails are long thick yellow dystrophic onychomycotic sharply incurvated painful on palpation as well as debridement. No changes in neuropathic status  Assessment: Pain in limb secondary to onychomycosis. Diabetic peripheral neuropathy  Plan: Debridement of toenails 1 through 5 bilateral covered service secondary to pain.

## 2017-01-09 DIAGNOSIS — E113293 Type 2 diabetes mellitus with mild nonproliferative diabetic retinopathy without macular edema, bilateral: Secondary | ICD-10-CM | POA: Diagnosis not present

## 2017-01-09 DIAGNOSIS — H401132 Primary open-angle glaucoma, bilateral, moderate stage: Secondary | ICD-10-CM | POA: Diagnosis not present

## 2017-01-09 DIAGNOSIS — Z961 Presence of intraocular lens: Secondary | ICD-10-CM | POA: Diagnosis not present

## 2017-01-11 DIAGNOSIS — E1165 Type 2 diabetes mellitus with hyperglycemia: Secondary | ICD-10-CM | POA: Diagnosis not present

## 2017-01-11 DIAGNOSIS — N183 Chronic kidney disease, stage 3 (moderate): Secondary | ICD-10-CM | POA: Diagnosis not present

## 2017-01-11 DIAGNOSIS — M545 Low back pain: Secondary | ICD-10-CM | POA: Diagnosis not present

## 2017-01-11 DIAGNOSIS — I10 Essential (primary) hypertension: Secondary | ICD-10-CM | POA: Diagnosis not present

## 2017-01-12 DIAGNOSIS — Z85828 Personal history of other malignant neoplasm of skin: Secondary | ICD-10-CM | POA: Diagnosis not present

## 2017-01-12 DIAGNOSIS — L820 Inflamed seborrheic keratosis: Secondary | ICD-10-CM | POA: Diagnosis not present

## 2017-01-12 DIAGNOSIS — I8312 Varicose veins of left lower extremity with inflammation: Secondary | ICD-10-CM | POA: Diagnosis not present

## 2017-01-12 DIAGNOSIS — L300 Nummular dermatitis: Secondary | ICD-10-CM | POA: Diagnosis not present

## 2017-01-12 DIAGNOSIS — L570 Actinic keratosis: Secondary | ICD-10-CM | POA: Diagnosis not present

## 2017-01-12 DIAGNOSIS — I872 Venous insufficiency (chronic) (peripheral): Secondary | ICD-10-CM | POA: Diagnosis not present

## 2017-01-12 DIAGNOSIS — I8311 Varicose veins of right lower extremity with inflammation: Secondary | ICD-10-CM | POA: Diagnosis not present

## 2017-01-12 DIAGNOSIS — L853 Xerosis cutis: Secondary | ICD-10-CM | POA: Diagnosis not present

## 2017-01-15 DIAGNOSIS — I10 Essential (primary) hypertension: Secondary | ICD-10-CM | POA: Diagnosis not present

## 2017-01-15 DIAGNOSIS — E1165 Type 2 diabetes mellitus with hyperglycemia: Secondary | ICD-10-CM | POA: Diagnosis not present

## 2017-01-15 DIAGNOSIS — M5440 Lumbago with sciatica, unspecified side: Secondary | ICD-10-CM | POA: Diagnosis not present

## 2017-01-15 DIAGNOSIS — J209 Acute bronchitis, unspecified: Secondary | ICD-10-CM | POA: Diagnosis not present

## 2017-01-17 ENCOUNTER — Other Ambulatory Visit (HOSPITAL_COMMUNITY): Payer: Self-pay | Admitting: Pulmonary Disease

## 2017-01-17 DIAGNOSIS — Z9889 Other specified postprocedural states: Secondary | ICD-10-CM

## 2017-02-13 ENCOUNTER — Encounter (HOSPITAL_COMMUNITY): Payer: Self-pay

## 2017-02-13 ENCOUNTER — Ambulatory Visit (HOSPITAL_COMMUNITY)
Admission: RE | Admit: 2017-02-13 | Discharge: 2017-02-13 | Disposition: A | Discharge status: Home / Self Care | Payer: Medicare HMO | Source: Ambulatory Visit | Attending: Pulmonary Disease | Admitting: Pulmonary Disease

## 2017-02-13 DIAGNOSIS — Z9889 Other specified postprocedural states: Secondary | ICD-10-CM | POA: Insufficient documentation

## 2017-02-13 DIAGNOSIS — R928 Other abnormal and inconclusive findings on diagnostic imaging of breast: Secondary | ICD-10-CM | POA: Diagnosis not present

## 2017-02-13 DIAGNOSIS — C50919 Malignant neoplasm of unspecified site of unspecified female breast: Secondary | ICD-10-CM | POA: Diagnosis present

## 2017-02-13 DIAGNOSIS — Z853 Personal history of malignant neoplasm of breast: Secondary | ICD-10-CM | POA: Diagnosis not present

## 2017-03-08 ENCOUNTER — Ambulatory Visit: Payer: Medicare HMO | Admitting: Podiatry

## 2017-03-08 ENCOUNTER — Encounter: Payer: Self-pay | Admitting: Podiatry

## 2017-03-08 DIAGNOSIS — E1140 Type 2 diabetes mellitus with diabetic neuropathy, unspecified: Secondary | ICD-10-CM

## 2017-03-08 DIAGNOSIS — IMO0002 Reserved for concepts with insufficient information to code with codable children: Secondary | ICD-10-CM

## 2017-03-08 DIAGNOSIS — M79676 Pain in unspecified toe(s): Secondary | ICD-10-CM

## 2017-03-08 DIAGNOSIS — B351 Tinea unguium: Secondary | ICD-10-CM

## 2017-03-08 DIAGNOSIS — E1165 Type 2 diabetes mellitus with hyperglycemia: Secondary | ICD-10-CM

## 2017-03-08 NOTE — Progress Notes (Signed)
She presents today with chief complaint of painful elongated toenails 1 through 5 bilateral.  Objective: Vital signs are stable she is alert and oriented x3 pulses are palpable.  No open lesions or wounds.  Toenails are long thick yellow dystrophic clinically mycotic and painful on palpation.  Assessment: Pain in limb secondary to onychomycosis.  Plan: Debridement of toenails 1 through 5 bilateral cover service secondary to pain.

## 2017-03-17 DIAGNOSIS — R69 Illness, unspecified: Secondary | ICD-10-CM | POA: Diagnosis not present

## 2017-03-28 DIAGNOSIS — K210 Gastro-esophageal reflux disease with esophagitis: Secondary | ICD-10-CM | POA: Diagnosis not present

## 2017-03-28 DIAGNOSIS — E1165 Type 2 diabetes mellitus with hyperglycemia: Secondary | ICD-10-CM | POA: Diagnosis not present

## 2017-03-28 DIAGNOSIS — E538 Deficiency of other specified B group vitamins: Secondary | ICD-10-CM | POA: Diagnosis not present

## 2017-03-28 DIAGNOSIS — R05 Cough: Secondary | ICD-10-CM | POA: Diagnosis not present

## 2017-03-28 DIAGNOSIS — I10 Essential (primary) hypertension: Secondary | ICD-10-CM | POA: Diagnosis not present

## 2017-04-03 DIAGNOSIS — R05 Cough: Secondary | ICD-10-CM | POA: Diagnosis not present

## 2017-04-03 DIAGNOSIS — I10 Essential (primary) hypertension: Secondary | ICD-10-CM | POA: Diagnosis not present

## 2017-04-03 DIAGNOSIS — E1165 Type 2 diabetes mellitus with hyperglycemia: Secondary | ICD-10-CM | POA: Diagnosis not present

## 2017-04-03 DIAGNOSIS — K210 Gastro-esophageal reflux disease with esophagitis: Secondary | ICD-10-CM | POA: Diagnosis not present

## 2017-04-05 DIAGNOSIS — R69 Illness, unspecified: Secondary | ICD-10-CM | POA: Diagnosis not present

## 2017-05-07 DIAGNOSIS — Z79891 Long term (current) use of opiate analgesic: Secondary | ICD-10-CM | POA: Diagnosis not present

## 2017-05-14 DIAGNOSIS — H401123 Primary open-angle glaucoma, left eye, severe stage: Secondary | ICD-10-CM | POA: Diagnosis not present

## 2017-05-14 DIAGNOSIS — Z961 Presence of intraocular lens: Secondary | ICD-10-CM | POA: Diagnosis not present

## 2017-05-14 DIAGNOSIS — H401111 Primary open-angle glaucoma, right eye, mild stage: Secondary | ICD-10-CM | POA: Diagnosis not present

## 2017-05-14 DIAGNOSIS — E113293 Type 2 diabetes mellitus with mild nonproliferative diabetic retinopathy without macular edema, bilateral: Secondary | ICD-10-CM | POA: Diagnosis not present

## 2017-05-17 ENCOUNTER — Encounter: Payer: Self-pay | Admitting: Podiatry

## 2017-05-17 ENCOUNTER — Ambulatory Visit: Payer: Medicare HMO | Admitting: Podiatry

## 2017-05-17 DIAGNOSIS — B351 Tinea unguium: Secondary | ICD-10-CM

## 2017-05-17 DIAGNOSIS — E1140 Type 2 diabetes mellitus with diabetic neuropathy, unspecified: Secondary | ICD-10-CM

## 2017-05-17 DIAGNOSIS — E1165 Type 2 diabetes mellitus with hyperglycemia: Secondary | ICD-10-CM

## 2017-05-17 DIAGNOSIS — M79676 Pain in unspecified toe(s): Secondary | ICD-10-CM | POA: Diagnosis not present

## 2017-05-17 DIAGNOSIS — IMO0002 Reserved for concepts with insufficient information to code with codable children: Secondary | ICD-10-CM

## 2017-05-17 NOTE — Progress Notes (Signed)
She presents today with a chief complaint of painful elongated toenails and a painful ankle left.  She states that she needs help to put her ankle brace on.  Objective: Vital signs are stable alert and oriented x3 pulses are strong and palpable.  Neurologic sensorium is intact.  Toenails are long thick yellow dystrophic with mycotic painful palpation.  She has a moderately swollen ankle left with a history of OA which are consistent with today's findings.  Assessment: Pain in limb secondary onychomycosis osteoarthritis of the left ankle.  Plan: Showed her how to apply her Tri-Lock brace and debrided toenails 1 through 5 bilateral.  I will follow-up with her in 3 months at which time if she is not improved we will get x-ray of her left ankle.

## 2017-06-27 DIAGNOSIS — E538 Deficiency of other specified B group vitamins: Secondary | ICD-10-CM | POA: Diagnosis not present

## 2017-06-27 DIAGNOSIS — N3281 Overactive bladder: Secondary | ICD-10-CM | POA: Diagnosis not present

## 2017-06-27 DIAGNOSIS — E1165 Type 2 diabetes mellitus with hyperglycemia: Secondary | ICD-10-CM | POA: Diagnosis not present

## 2017-06-27 DIAGNOSIS — N183 Chronic kidney disease, stage 3 (moderate): Secondary | ICD-10-CM | POA: Diagnosis not present

## 2017-07-05 ENCOUNTER — Ambulatory Visit (INDEPENDENT_AMBULATORY_CARE_PROVIDER_SITE_OTHER): Payer: Medicare HMO

## 2017-07-05 ENCOUNTER — Ambulatory Visit: Payer: Medicare HMO | Admitting: Podiatry

## 2017-07-05 ENCOUNTER — Encounter: Payer: Self-pay | Admitting: Podiatry

## 2017-07-05 DIAGNOSIS — M779 Enthesopathy, unspecified: Secondary | ICD-10-CM | POA: Diagnosis not present

## 2017-07-05 DIAGNOSIS — M7752 Other enthesopathy of left foot: Secondary | ICD-10-CM

## 2017-07-05 NOTE — Progress Notes (Signed)
She presents today chief complaint of pain to the left foot and ankle.  She states that is been bothering her for many years I broke this 62 and is been hurting probably since then.  But recently has started to swell a lot more and is a lot more painful.  Objective: Vital signs are stable she is alert and oriented x3.  There is no erythema there is moderate edema.  There is some pitting edema to the left lower extremity of the right lower extremity is edematous as well.  She has pain on palpation of the anterior and medial gutter of the ankle.  Radiographs today do demonstrate joint space narrowing subchondral sclerosis and spurring.  Assessment: Ankle joint capsulitis and osteoarthritis.  Plan: After sterile Betadine skin prep I injected 20 mg Kenalog 5 mg Marcaine to the ankle joint.  Tolerated procedure well without complications I will follow-up with her in 1 month or so if needed.

## 2017-07-06 DIAGNOSIS — E538 Deficiency of other specified B group vitamins: Secondary | ICD-10-CM | POA: Diagnosis not present

## 2017-07-06 DIAGNOSIS — N183 Chronic kidney disease, stage 3 (moderate): Secondary | ICD-10-CM | POA: Diagnosis not present

## 2017-07-06 DIAGNOSIS — N3281 Overactive bladder: Secondary | ICD-10-CM | POA: Diagnosis not present

## 2017-07-06 DIAGNOSIS — E1165 Type 2 diabetes mellitus with hyperglycemia: Secondary | ICD-10-CM | POA: Diagnosis not present

## 2017-07-07 DIAGNOSIS — R69 Illness, unspecified: Secondary | ICD-10-CM | POA: Diagnosis not present

## 2017-07-16 DIAGNOSIS — D225 Melanocytic nevi of trunk: Secondary | ICD-10-CM | POA: Diagnosis not present

## 2017-07-16 DIAGNOSIS — Z85828 Personal history of other malignant neoplasm of skin: Secondary | ICD-10-CM | POA: Diagnosis not present

## 2017-07-16 DIAGNOSIS — D485 Neoplasm of uncertain behavior of skin: Secondary | ICD-10-CM | POA: Diagnosis not present

## 2017-07-16 DIAGNOSIS — I8311 Varicose veins of right lower extremity with inflammation: Secondary | ICD-10-CM | POA: Diagnosis not present

## 2017-07-16 DIAGNOSIS — L570 Actinic keratosis: Secondary | ICD-10-CM | POA: Diagnosis not present

## 2017-07-16 DIAGNOSIS — L578 Other skin changes due to chronic exposure to nonionizing radiation: Secondary | ICD-10-CM | POA: Diagnosis not present

## 2017-07-16 DIAGNOSIS — I872 Venous insufficiency (chronic) (peripheral): Secondary | ICD-10-CM | POA: Diagnosis not present

## 2017-07-16 DIAGNOSIS — C44319 Basal cell carcinoma of skin of other parts of face: Secondary | ICD-10-CM | POA: Diagnosis not present

## 2017-07-16 DIAGNOSIS — L821 Other seborrheic keratosis: Secondary | ICD-10-CM | POA: Diagnosis not present

## 2017-07-16 DIAGNOSIS — I8312 Varicose veins of left lower extremity with inflammation: Secondary | ICD-10-CM | POA: Diagnosis not present

## 2017-07-19 ENCOUNTER — Encounter: Payer: Self-pay | Admitting: Podiatry

## 2017-07-19 ENCOUNTER — Ambulatory Visit: Payer: Medicare HMO | Admitting: Podiatry

## 2017-07-19 DIAGNOSIS — E1165 Type 2 diabetes mellitus with hyperglycemia: Secondary | ICD-10-CM | POA: Diagnosis not present

## 2017-07-19 DIAGNOSIS — M79676 Pain in unspecified toe(s): Secondary | ICD-10-CM

## 2017-07-19 DIAGNOSIS — IMO0002 Reserved for concepts with insufficient information to code with codable children: Secondary | ICD-10-CM

## 2017-07-19 DIAGNOSIS — M779 Enthesopathy, unspecified: Secondary | ICD-10-CM

## 2017-07-19 DIAGNOSIS — B351 Tinea unguium: Secondary | ICD-10-CM | POA: Diagnosis not present

## 2017-07-19 DIAGNOSIS — E1140 Type 2 diabetes mellitus with diabetic neuropathy, unspecified: Secondary | ICD-10-CM

## 2017-07-19 DIAGNOSIS — M7752 Other enthesopathy of left foot: Secondary | ICD-10-CM

## 2017-07-19 NOTE — Progress Notes (Signed)
She presents today chief complaint of painful elongated toenails as well as a painful left ankle.  She states that the last time gave her injection worked very well she is very happy with the outcome could she have another injection please.  Objective: Vital signs are stable she is alert and oriented x3.  Pulses are palpable.  Neurologic sensorium is intact she has long thick yellow dystrophic with mycotic nails sharply incurvated painful palpation as well as debridement.  She also has some tenderness on palpation to the anterior medial gutter of the left ankle.  Much decreased from previous evaluations.  Assessment: Resolving capsulitis of the ankle.  Pain in limb secondary to onychomycosis.  Plan: Debridement of toenails 1 through 5 bilateral.  Also injected the ankle today with 10 mg Kenalog and 5 mg of Marcaine she tolerated procedure well application of Betadine.  I will follow-up with her in 2 months.

## 2017-07-25 ENCOUNTER — Emergency Department (HOSPITAL_COMMUNITY): Payer: Medicare HMO

## 2017-07-25 ENCOUNTER — Other Ambulatory Visit: Payer: Self-pay

## 2017-07-25 ENCOUNTER — Encounter (HOSPITAL_COMMUNITY): Payer: Self-pay | Admitting: Emergency Medicine

## 2017-07-25 ENCOUNTER — Inpatient Hospital Stay (HOSPITAL_COMMUNITY)
Admission: EM | Admit: 2017-07-25 | Discharge: 2017-07-27 | DRG: 689 | Disposition: A | Discharge status: Home / Self Care | Payer: Medicare HMO | Attending: Pulmonary Disease | Admitting: Pulmonary Disease

## 2017-07-25 DIAGNOSIS — Z96651 Presence of right artificial knee joint: Secondary | ICD-10-CM | POA: Diagnosis present

## 2017-07-25 DIAGNOSIS — J209 Acute bronchitis, unspecified: Secondary | ICD-10-CM | POA: Diagnosis not present

## 2017-07-25 DIAGNOSIS — G9611 Dural tear: Secondary | ICD-10-CM | POA: Diagnosis not present

## 2017-07-25 DIAGNOSIS — I129 Hypertensive chronic kidney disease with stage 1 through stage 4 chronic kidney disease, or unspecified chronic kidney disease: Secondary | ICD-10-CM | POA: Diagnosis not present

## 2017-07-25 DIAGNOSIS — Z91030 Bee allergy status: Secondary | ICD-10-CM

## 2017-07-25 DIAGNOSIS — Z79811 Long term (current) use of aromatase inhibitors: Secondary | ICD-10-CM

## 2017-07-25 DIAGNOSIS — R0902 Hypoxemia: Secondary | ICD-10-CM | POA: Diagnosis present

## 2017-07-25 DIAGNOSIS — E785 Hyperlipidemia, unspecified: Secondary | ICD-10-CM | POA: Diagnosis present

## 2017-07-25 DIAGNOSIS — G9340 Encephalopathy, unspecified: Secondary | ICD-10-CM | POA: Diagnosis not present

## 2017-07-25 DIAGNOSIS — N390 Urinary tract infection, site not specified: Secondary | ICD-10-CM | POA: Diagnosis not present

## 2017-07-25 DIAGNOSIS — C50912 Malignant neoplasm of unspecified site of left female breast: Secondary | ICD-10-CM | POA: Diagnosis present

## 2017-07-25 DIAGNOSIS — N183 Chronic kidney disease, stage 3 (moderate): Secondary | ICD-10-CM | POA: Diagnosis not present

## 2017-07-25 DIAGNOSIS — Z170 Estrogen receptor positive status [ER+]: Secondary | ICD-10-CM

## 2017-07-25 DIAGNOSIS — M545 Low back pain, unspecified: Secondary | ICD-10-CM | POA: Diagnosis present

## 2017-07-25 DIAGNOSIS — N184 Chronic kidney disease, stage 4 (severe): Secondary | ICD-10-CM | POA: Diagnosis not present

## 2017-07-25 DIAGNOSIS — Z7984 Long term (current) use of oral hypoglycemic drugs: Secondary | ICD-10-CM

## 2017-07-25 DIAGNOSIS — G9341 Metabolic encephalopathy: Secondary | ICD-10-CM | POA: Diagnosis not present

## 2017-07-25 DIAGNOSIS — D631 Anemia in chronic kidney disease: Secondary | ICD-10-CM | POA: Diagnosis not present

## 2017-07-25 DIAGNOSIS — Z7982 Long term (current) use of aspirin: Secondary | ICD-10-CM

## 2017-07-25 DIAGNOSIS — R4182 Altered mental status, unspecified: Secondary | ICD-10-CM | POA: Diagnosis not present

## 2017-07-25 DIAGNOSIS — Z885 Allergy status to narcotic agent status: Secondary | ICD-10-CM | POA: Diagnosis not present

## 2017-07-25 DIAGNOSIS — E1140 Type 2 diabetes mellitus with diabetic neuropathy, unspecified: Secondary | ICD-10-CM | POA: Diagnosis not present

## 2017-07-25 DIAGNOSIS — E1165 Type 2 diabetes mellitus with hyperglycemia: Secondary | ICD-10-CM | POA: Diagnosis not present

## 2017-07-25 DIAGNOSIS — Z79899 Other long term (current) drug therapy: Secondary | ICD-10-CM | POA: Diagnosis not present

## 2017-07-25 DIAGNOSIS — G3184 Mild cognitive impairment, so stated: Secondary | ICD-10-CM | POA: Diagnosis present

## 2017-07-25 DIAGNOSIS — A419 Sepsis, unspecified organism: Secondary | ICD-10-CM | POA: Diagnosis not present

## 2017-07-25 DIAGNOSIS — I10 Essential (primary) hypertension: Secondary | ICD-10-CM | POA: Diagnosis present

## 2017-07-25 DIAGNOSIS — G8929 Other chronic pain: Secondary | ICD-10-CM | POA: Diagnosis present

## 2017-07-25 DIAGNOSIS — K219 Gastro-esophageal reflux disease without esophagitis: Secondary | ICD-10-CM | POA: Diagnosis present

## 2017-07-25 DIAGNOSIS — E1122 Type 2 diabetes mellitus with diabetic chronic kidney disease: Secondary | ICD-10-CM | POA: Diagnosis present

## 2017-07-25 DIAGNOSIS — Z794 Long term (current) use of insulin: Secondary | ICD-10-CM

## 2017-07-25 DIAGNOSIS — R05 Cough: Secondary | ICD-10-CM | POA: Diagnosis not present

## 2017-07-25 DIAGNOSIS — Z66 Do not resuscitate: Secondary | ICD-10-CM | POA: Diagnosis present

## 2017-07-25 DIAGNOSIS — R531 Weakness: Secondary | ICD-10-CM

## 2017-07-25 LAB — URINALYSIS, ROUTINE W REFLEX MICROSCOPIC
Bilirubin Urine: NEGATIVE
Glucose, UA: >=500 mg/dL — AB
Hgb urine dipstick: NEGATIVE
Ketones, ur: NEGATIVE mg/dL
Nitrite: NEGATIVE
Protein, ur: NEGATIVE mg/dL
Specific Gravity, Urine: 1.014 (ref 1.005–1.030)
pH: 5 (ref 5.0–8.0)

## 2017-07-25 LAB — CBC
HCT: 38.1 % (ref 36.0–46.0)
Hemoglobin: 11.4 g/dL — ABNORMAL LOW (ref 12.0–15.0)
MCH: 27.2 pg (ref 26.0–34.0)
MCHC: 29.9 g/dL — ABNORMAL LOW (ref 30.0–36.0)
MCV: 90.9 fL (ref 78.0–100.0)
Platelets: 141 10*3/uL — ABNORMAL LOW (ref 150–400)
RBC: 4.19 MIL/uL (ref 3.87–5.11)
RDW: 14 % (ref 11.5–15.5)
WBC: 8.9 10*3/uL (ref 4.0–10.5)

## 2017-07-25 LAB — CBG MONITORING, ED
GLUCOSE-CAPILLARY: 291 mg/dL — AB (ref 65–99)
Glucose-Capillary: 33 mg/dL — CL (ref 65–99)
Glucose-Capillary: 373 mg/dL — ABNORMAL HIGH (ref 65–99)

## 2017-07-25 LAB — LACTIC ACID, PLASMA
Lactic Acid, Venous: 2.4 mmol/L (ref 0.5–1.9)
Lactic Acid, Venous: 3.7 mmol/L (ref 0.5–1.9)

## 2017-07-25 LAB — COMPREHENSIVE METABOLIC PANEL WITH GFR
ALT: 52 U/L (ref 14–54)
AST: 94 U/L — ABNORMAL HIGH (ref 15–41)
Albumin: 4 g/dL (ref 3.5–5.0)
Alkaline Phosphatase: 151 U/L — ABNORMAL HIGH (ref 38–126)
Anion gap: 12 (ref 5–15)
BUN: 39 mg/dL — ABNORMAL HIGH (ref 6–20)
CO2: 26 mmol/L (ref 22–32)
Calcium: 9.4 mg/dL (ref 8.9–10.3)
Chloride: 97 mmol/L — ABNORMAL LOW (ref 101–111)
Creatinine, Ser: 1.91 mg/dL — ABNORMAL HIGH (ref 0.44–1.00)
GFR calc Af Amer: 27 mL/min — ABNORMAL LOW
GFR calc non Af Amer: 24 mL/min — ABNORMAL LOW
Glucose, Bld: 281 mg/dL — ABNORMAL HIGH (ref 65–99)
Potassium: 4.9 mmol/L (ref 3.5–5.1)
Sodium: 135 mmol/L (ref 135–145)
Total Bilirubin: 1 mg/dL (ref 0.3–1.2)
Total Protein: 8.2 g/dL — ABNORMAL HIGH (ref 6.5–8.1)

## 2017-07-25 MED ORDER — GUAIFENESIN ER 600 MG PO TB12
600.0000 mg | ORAL_TABLET | Freq: Two times a day (BID) | ORAL | Status: DC
  Administered 2017-07-26 – 2017-07-27 (×4): 600 mg via ORAL
  Filled 2017-07-25 (×4): qty 1

## 2017-07-25 MED ORDER — ONDANSETRON HCL 4 MG PO TABS: 4.0000 mg | ORAL_TABLET | Freq: Four times a day (QID) | ORAL | Status: DC | PRN

## 2017-07-25 MED ORDER — SODIUM CHLORIDE 0.9 % IV SOLN
1.0000 g | Freq: Once | INTRAVENOUS | Status: AC
  Administered 2017-07-25: 1 g via INTRAVENOUS
  Filled 2017-07-25: qty 10

## 2017-07-25 MED ORDER — DEXTROSE 50 % IV SOLN
INTRAVENOUS | Status: AC
  Administered 2017-07-25: 50 mL
  Filled 2017-07-25: qty 50

## 2017-07-25 MED ORDER — ATORVASTATIN CALCIUM 10 MG PO TABS
10.0000 mg | ORAL_TABLET | Freq: Every evening | ORAL | Status: DC
  Administered 2017-07-26: 10 mg via ORAL
  Filled 2017-07-25: qty 1

## 2017-07-25 MED ORDER — ACETAMINOPHEN 650 MG RE SUPP: 650.0000 mg | Freq: Four times a day (QID) | RECTAL | Status: DC | PRN

## 2017-07-25 MED ORDER — ONDANSETRON HCL 4 MG/2ML IJ SOLN: 4.0000 mg | Freq: Four times a day (QID) | INTRAMUSCULAR | Status: DC | PRN

## 2017-07-25 MED ORDER — GABAPENTIN 600 MG PO TABS
600.0000 mg | ORAL_TABLET | Freq: Every day | ORAL | Status: DC
  Filled 2017-07-25 (×2): qty 1

## 2017-07-25 MED ORDER — ACETAMINOPHEN 325 MG PO TABS: 650.0000 mg | ORAL_TABLET | Freq: Four times a day (QID) | ORAL | Status: DC | PRN

## 2017-07-25 MED ORDER — METOPROLOL SUCCINATE ER 50 MG PO TB24
100.0000 mg | ORAL_TABLET | Freq: Every morning | ORAL | Status: DC
  Administered 2017-07-26 – 2017-07-27 (×2): 100 mg via ORAL
  Filled 2017-07-25 (×2): qty 2

## 2017-07-25 MED ORDER — DORZOLAMIDE HCL 2 % OP SOLN
1.0000 [drp] | Freq: Two times a day (BID) | OPHTHALMIC | Status: DC
  Administered 2017-07-26 – 2017-07-27 (×4): 1 [drp] via OPHTHALMIC
  Filled 2017-07-25 (×3): qty 10

## 2017-07-25 MED ORDER — TORSEMIDE 20 MG PO TABS
20.0000 mg | ORAL_TABLET | Freq: Two times a day (BID) | ORAL | Status: DC
  Administered 2017-07-26 – 2017-07-27 (×3): 20 mg via ORAL
  Filled 2017-07-25 (×3): qty 1

## 2017-07-25 MED ORDER — AMLODIPINE BESYLATE 5 MG PO TABS
5.0000 mg | ORAL_TABLET | Freq: Every morning | ORAL | Status: DC
  Administered 2017-07-26 – 2017-07-27 (×2): 5 mg via ORAL
  Filled 2017-07-25 (×2): qty 1

## 2017-07-25 MED ORDER — MIRABEGRON ER 25 MG PO TB24
25.0000 mg | ORAL_TABLET | Freq: Every day | ORAL | Status: DC
  Administered 2017-07-26 – 2017-07-27 (×2): 25 mg via ORAL
  Filled 2017-07-25 (×4): qty 1

## 2017-07-25 MED ORDER — ANASTROZOLE 1 MG PO TABS
1.0000 mg | ORAL_TABLET | Freq: Every day | ORAL | Status: DC
  Administered 2017-07-27: 1 mg via ORAL
  Filled 2017-07-25 (×7): qty 1

## 2017-07-25 MED ORDER — ASPIRIN EC 81 MG PO TBEC
81.0000 mg | DELAYED_RELEASE_TABLET | Freq: Every day | ORAL | Status: DC
  Administered 2017-07-26 – 2017-07-27 (×2): 81 mg via ORAL
  Filled 2017-07-25 (×2): qty 1

## 2017-07-25 MED ORDER — ENOXAPARIN SODIUM 30 MG/0.3ML ~~LOC~~ SOLN
30.0000 mg | SUBCUTANEOUS | Status: DC
  Administered 2017-07-26 (×2): 30 mg via SUBCUTANEOUS
  Filled 2017-07-25 (×2): qty 0.3

## 2017-07-25 MED ORDER — HYDRALAZINE HCL 25 MG PO TABS
50.0000 mg | ORAL_TABLET | Freq: Every day | ORAL | Status: DC
  Administered 2017-07-26 – 2017-07-27 (×2): 50 mg via ORAL
  Filled 2017-07-25 (×2): qty 2

## 2017-07-25 MED ORDER — MIRTAZAPINE 15 MG PO TABS
7.5000 mg | ORAL_TABLET | Freq: Every day | ORAL | Status: DC
  Administered 2017-07-26 (×2): 7.5 mg via ORAL
  Filled 2017-07-25 (×2): qty 1

## 2017-07-25 MED ORDER — PANTOPRAZOLE SODIUM 40 MG PO TBEC
40.0000 mg | DELAYED_RELEASE_TABLET | Freq: Every day | ORAL | Status: DC
  Administered 2017-07-26 – 2017-07-27 (×2): 40 mg via ORAL
  Filled 2017-07-25 (×2): qty 1

## 2017-07-25 MED ORDER — IRBESARTAN 150 MG PO TABS
150.0000 mg | ORAL_TABLET | Freq: Every day | ORAL | Status: DC
  Administered 2017-07-26 – 2017-07-27 (×2): 150 mg via ORAL
  Filled 2017-07-25 (×3): qty 1

## 2017-07-25 MED ORDER — BRIMONIDINE TARTRATE 0.15 % OP SOLN
1.0000 [drp] | Freq: Two times a day (BID) | OPHTHALMIC | Status: DC
  Administered 2017-07-26 – 2017-07-27 (×4): 1 [drp] via OPHTHALMIC
  Filled 2017-07-25 (×2): qty 5

## 2017-07-25 MED ORDER — AZITHROMYCIN 250 MG PO TABS
500.0000 mg | ORAL_TABLET | Freq: Once | ORAL | Status: AC
  Administered 2017-07-25: 500 mg via ORAL
  Filled 2017-07-25: qty 2

## 2017-07-25 MED ORDER — IPRATROPIUM BROMIDE 0.02 % IN SOLN: 0.5000 mg | Freq: Four times a day (QID) | RESPIRATORY_TRACT | Status: DC

## 2017-07-25 MED ORDER — ALBUTEROL SULFATE (2.5 MG/3ML) 0.083% IN NEBU: 2.5000 mg | INHALATION_SOLUTION | Freq: Four times a day (QID) | RESPIRATORY_TRACT | Status: DC

## 2017-07-25 MED ORDER — HYDRALAZINE HCL 25 MG PO TABS
25.0000 mg | ORAL_TABLET | Freq: Every day | ORAL | Status: DC
  Administered 2017-07-26 (×2): 25 mg via ORAL
  Filled 2017-07-25 (×2): qty 1

## 2017-07-25 MED ORDER — SODIUM CHLORIDE 0.9 % IV BOLUS
1000.0000 mL | Freq: Once | INTRAVENOUS | Status: AC
  Administered 2017-07-25: 1000 mL via INTRAVENOUS

## 2017-07-25 MED ORDER — HYDRALAZINE HCL 25 MG PO TABS: 25.0000 mg | ORAL_TABLET | ORAL | Status: DC

## 2017-07-25 MED ORDER — SODIUM CHLORIDE 0.9 % IV SOLN
1.0000 g | INTRAVENOUS | Status: DC
  Administered 2017-07-26: 1 g via INTRAVENOUS
  Filled 2017-07-25 (×3): qty 10
  Filled 2017-07-25: qty 1

## 2017-07-25 MED ORDER — IPRATROPIUM-ALBUTEROL 0.5-2.5 (3) MG/3ML IN SOLN: 3.0000 mL | Freq: Four times a day (QID) | RESPIRATORY_TRACT | Status: DC

## 2017-07-25 MED ORDER — OXYCODONE HCL 5 MG PO TABS
10.0000 mg | ORAL_TABLET | Freq: Three times a day (TID) | ORAL | Status: DC
Start: 2017-07-25 — End: 2017-07-27
  Administered 2017-07-26 – 2017-07-27 (×4): 10 mg via ORAL
  Filled 2017-07-25 (×5): qty 2

## 2017-07-25 MED ORDER — LATANOPROST 0.005 % OP SOLN
1.0000 [drp] | Freq: Every day | OPHTHALMIC | Status: DC
  Administered 2017-07-26 (×2): 1 [drp] via OPHTHALMIC
  Filled 2017-07-25 (×2): qty 2.5

## 2017-07-25 MED ORDER — INSULIN ASPART 100 UNIT/ML ~~LOC~~ SOLN
0.0000 [IU] | Freq: Three times a day (TID) | SUBCUTANEOUS | Status: DC
  Administered 2017-07-26: 5 [IU] via SUBCUTANEOUS
  Administered 2017-07-26: 2 [IU] via SUBCUTANEOUS
  Administered 2017-07-27: 3 [IU] via SUBCUTANEOUS

## 2017-07-25 MED ORDER — SODIUM CHLORIDE 0.9 % IV SOLN
INTRAVENOUS | Status: DC
  Administered 2017-07-26: 01:00:00 via INTRAVENOUS

## 2017-07-25 NOTE — ED Notes (Addendum)
CRITICAL VALUE ALERT  Critical Value:  Lactic 3.7  Date & Time Notied:  07/25/17 1950  Admitting provider, Darrick Meigs    No orders at this time

## 2017-07-25 NOTE — ED Notes (Signed)
CRITICAL VALUE ALERT  Critical Value:  Lactic 2.4  Date & Time Notied:  07-25-17 1908  Provider Notified: Koh[place],MD  Orders Received/Actions taken: pt to be admitted.

## 2017-07-25 NOTE — ED Notes (Signed)
Pt sats 89-90 on room air. 02 placed 2L pt sats now 94%

## 2017-07-25 NOTE — H&P (Signed)
TRH H&P    Patient Demographics:    [name], is a 84 y.o. female  MRN: [id number]  DOB - Jul 08, 1933  Admit Date - 07/25/2017  Referring MD/NP/PA: Dr. Wilson Singer  Outpatient Primary MD for the patient is Sinda Du, MD  Patient coming from home  Chief complaint-Altered mental status   HPI:    [name]  is a 84 y.o. female,with history of breast cancer, diabetes mellitus, grade 1 diastolic dysfunction, hypertension was brought to hospital with complaints of altered mental status.  Patient says that her blood glucose dropped in the morning.  She denies passing out.  No chest pain or shortness of breath.  She does complain of dyspnea on exertion.  She is not on home oxygen. She denies dysuria. In the ED she was found to have abnormal UTI Patient started on Ceftriaxone She also had mild hypoxia, O2 sats in 90s. CXR showed no abnormality Denies history of stroke, seizures    Review of systems:      All other systems reviewed and are negative.   With Past History of the following :    Past Medical History:  Diagnosis Date  . Abrasion of arm, left    secondary to fall   . Anemia   . Arthritis   . Breast cancer, left ([facility])    er/pr +  . Cataract    no surgery as yet,starting of 3 years  . Chronic kidney disease    renal stones, one episode of Lithotripsy  . Cirrhosis ([facility])    Metavir score 4  . Complication of anesthesia   . Diabetes mellitus   . GERD (gastroesophageal reflux disease)   . Hyperlipidemia   . Hypertension    followed by [facility]. relative to  urology study grp.  Doesn't report ever having a stress test   . Mental disorder   . Multiple falls   . Nocturia   . PONV (postoperative nausea and vomiting)   . Seasonal allergies   . Skin cancer    face- melanoma, 2012- surg. excision        Past Surgical History:  Procedure Laterality Date  . ABDOMINAL  HYSTERECTOMY    . BACK SURGERY     x4 back surgery to include rods & screws  . Last in May 2014  . BREAST LUMPECTOMY WITH NEEDLE LOCALIZATION AND AXILLARY SENTINEL LYMPH NODE BX  03/12/2012   Procedure: BREAST LUMPECTOMY WITH NEEDLE LOCALIZATION AND AXILLARY SENTINEL LYMPH NODE BX;  Surgeon: Merrie Roof, MD;  Location: [facility];  Service: General;  Laterality: Left;  . BREAST SURGERY     lumpectomy x2/left   . CHOLECYSTECTOMY    . COLONOSCOPY N/A 11/20/2013   Dr.Rourk- diverticula was found in the left colon. o/w normal rectal, colonic and terminal ileal mucosa  . ESOPHAGOGASTRODUODENOSCOPY N/A 11/20/2013   Dr.Rourk- abnormal mucosa was found. diffuse snake skinning and friability of the gastric mucosa. patent pylorus. abnormal-appearing first, second and third portion of the duodenum. 51mm gastric nodule at the GE junction. stomach bx=  minimal chronic infammation, GE junction bx= polypoid fragments of gastric cardia type mucosa w/mod chronic inflammation and foveolar hyperplasia  . EYE SURGERY     cataract surgery bilat   . FRACTURE SURGERY     1975- [facility]- L ankle   . GIVENS CAPSULE STUDY N/A 02/24/2014   Procedure: GIVENS CAPSULE STUDY;  Surgeon: Daneil Dolin, MD;  [facility];  Service: Endoscopy;  Laterality: N/A;  . RE-EXCISION OF BREAST LUMPECTOMY  03/22/2012   Procedure: RE-EXCISION OF BREAST LUMPECTOMY;  Surgeon: Merrie Roof, MD;  Location: [facility];  Service: General;  Laterality: Left;  re-excision left breast lumpectomy cavity  ER+, PR+  . ROTATOR CUFF REPAIR Bilateral   . SHOULDER SURGERY    . SKIN BIOPSY    . TOTAL KNEE ARTHROPLASTY Right 12/18/2014   Procedure: RIGHT TOTAL KNEE ARTHROPLASTY;  Surgeon: Sydnee Cabal, MD;  Location: [facility];  Service: Orthopedics;  Laterality: Right;  . TUBAL LIGATION        Social History:      Social History   Tobacco Use  . Smoking status: Never Smoker  . Smokeless tobacco: Never Used  Substance Use  Topics  . Alcohol use: No       Family History :     Family History  Problem Relation Age of Onset  . Heart disease Father   . Colon cancer Neg Hx       Home Medications:   Prior to Admission medications   Medication Sig Start Date End Date Taking? Authorizing Provider  ALPHAGAN P 0.1 % SOLN Place 1 drop into both eyes 2 (two) times daily.  08/01/16  Yes [provider]  amLODipine (NORVASC) 5 MG tablet Take 5 mg by mouth every morning.    Yes [provider]  anastrozole (ARIMIDEX) 1 MG tablet Take 1 mg by mouth daily.   Yes [provider]  aspirin EC 81 MG tablet Take 81 mg by mouth daily.   Yes [provider]  atorvastatin (LIPITOR) 10 MG tablet Take 10 mg by mouth every evening.    Yes [provider]  Bacillus Coagulans-Inulin (PROBIOTIC FORMULA PO) Take 1 capsule by mouth daily.   Yes [provider]  cyanocobalamin (,VITAMIN B-12,) 1000 MCG/ML injection Inject 1,000 mcg into the muscle every 30 (thirty) days.  05/14/17  Yes [provider]  dorzolamide (TRUSOPT) 2 % ophthalmic solution PLACE ONE DROP IN [facility] EYE TWICE DAILY 06/12/17  Yes [provider]  EPIPEN 2-PAK 0.3 MG/0.3ML SOAJ injection Inject 0.3 mg into the muscle once.  07/01/15  Yes [provider]  gabapentin (NEURONTIN) 600 MG tablet Take 600 mg by mouth at bedtime.  05/14/17  Yes [provider]  glipiZIDE (GLUCOTROL) 5 MG tablet Take 5 mg by mouth daily before breakfast.  07/31/16  Yes [provider]  hydrALAZINE (APRESOLINE) 25 MG tablet Take 25-50 mg by mouth See admin instructions. 50mg  in the morning and 25mg  at bedtime   Yes [provider]  irbesartan (AVAPRO) 150 MG tablet Take 150 mg by mouth daily.   Yes [provider]  latanoprost (XALATAN) 0.005 % ophthalmic solution Place 1 drop into both eyes at bedtime.  08/10/15  Yes [provider]  metFORMIN (GLUCOPHAGE) 1000 MG tablet Take  1,000 mg by mouth daily with breakfast.  07/31/16  Yes [provider]  metoprolol succinate (TOPROL-XL) 100 MG 24 hr tablet Take 100 mg by mouth every morning. Take  with or immediately following a meal.   Yes [provider]  mirtazapine (REMERON) 7.5 MG tablet Take 7.5 mg by mouth at bedtime.  08/04/15  Yes [provider]  MYRBETRIQ 25 MG TB24 tablet Take 25 mg by mouth daily.  07/16/17  Yes [provider]  omeprazole (PRILOSEC) 20 MG capsule Take 20 mg by mouth daily.   Yes [provider]  Oxycodone HCl 10 MG TABS Take 10 mg by mouth 3 (three) times daily.  08/26/15  Yes [provider]  torsemide (DEMADEX) 20 MG tablet Take 20 mg by mouth 2 (two) times daily.  06/15/15  Yes [provider]  TRESIBA FLEXTOUCH 100 UNIT/ML SOPN Inject 70 Units into the skin every morning.  08/04/15  Yes [provider]     Allergies:     Allergies  Allergen Reactions  . Bee Venom Anaphylaxis  . Hydromorphone Hcl     Other reaction(s): Agitation, Confusion  . Demerol Nausea Only  . Other Other (See Comments)    Bee stings-swelling     Physical Exam:   Vitals  Blood pressure (!) 128/57, pulse 81, temperature 100.2 F (37.9 C), temperature source Oral, resp. rate (!) 23, height 5\' 1"  (1.549 m), weight 81.6 kg (180 lb), SpO2 95 %.  1.  General: Appears in no acute distress  2. Psychiatric:  Intact judgement and  insight, awake alert, oriented x 3.  3. Neurologic: No focal neurological deficits, all cranial nerves intact.Strength 5/5 all 4 extremities, sensation intact all 4 extremities, plantars down going.  4. Eyes :  anicteric sclerae, moist conjunctivae with no lid lag. PERRLA.  5. ENMT:  Oropharynx clear with moist mucous membranes and good dentition  6. Neck:  supple, no cervical lymphadenopathy appriciated, No thyromegaly  7. Respiratory : Normal respiratory effort, good air movement bilaterally,clear to   auscultation bilaterally  8. Cardiovascular : RRR, no gallops, rubs or murmurs, + trace  leg edema  9. Gastrointestinal:  Positive bowel sounds, abdomen soft, non-tender to palpation,no hepatosplenomegaly, no rigidity or guarding       10. Skin:  No cyanosis, normal texture and turgor, no rash, lesions or ulcers  11.Musculoskeletal:  Good muscle tone,  joints appear normal , no effusions,  normal range of motion    Data Review:    CBC Recent Labs  Lab 07/25/17 1558  WBC 8.9  HGB 11.4*  HCT 38.1  PLT 141*  MCV 90.9  MCH 27.2  MCHC 29.9*  RDW 14.0   ------------------------------------------------------------------------------------------------------------------  Chemistries  Recent Labs  Lab 07/25/17 1558  NA 135  K 4.9  CL 97*  CO2 26  GLUCOSE 281*  BUN 39*  CREATININE 1.91*  CALCIUM 9.4  AST 94*  ALT 52  ALKPHOS 151*  BILITOT 1.0   ------------------------------------------------------------------------------------------------------------------  ------------------------------------------------------------------------------------------------------------------ GFR: Estimated Creatinine Clearance: 22.4 mL/min (A) (by C-G formula based on SCr of 1.91 mg/dL (H)). Liver Function Tests: Recent Labs  Lab 07/25/17 1558  AST 94*  ALT 52  ALKPHOS 151*  BILITOT 1.0  PROT 8.2*  ALBUMIN 4.0    Recent Labs  Lab 07/25/17 1542  GLUCAP 291*   Lipid Profile: No results for input(s): CHOL, HDL, LDLCALC, TRIG, CHOLHDL, LDLDIRECT in the last 72 hours. Thyroid Function Tests: No results for input(s): TSH, T4TOTAL, FREET4, T3FREE, THYROIDAB in the last 72 hours. Anemia Panel: No results for input(s): VITAMINB12, FOLATE, FERRITIN, TIBC, IRON, RETICCTPCT in the last 72 hours.  --------------------------------------------------------------------------------------------------------------- Urine analysis:    Component  Value Date/Time   COLORURINE YELLOW  07/25/2017 1559   APPEARANCEUR HAZY (A) 07/25/2017 1559   LABSPEC 1.014 07/25/2017 1559   PHURINE 5.0 07/25/2017 1559   GLUCOSEU >=500 (A) 07/25/2017 1559   HGBUR NEGATIVE 07/25/2017 1559   BILIRUBINUR NEGATIVE 07/25/2017 1559   KETONESUR NEGATIVE 07/25/2017 1559   PROTEINUR NEGATIVE 07/25/2017 1559   UROBILINOGEN 1.0 12/11/2014 1106   NITRITE NEGATIVE 07/25/2017 1559   LEUKOCYTESUR TRACE (A) 07/25/2017 1559      Imaging Results:    Dg Chest 2 View  Result Date: 07/25/2017 CLINICAL DATA:  Nonproductive cough EXAM: CHEST - 2 VIEW COMPARISON:  04/28/2016 FINDINGS: Normal heart size. Lungs are under aerated with bibasilar atelectasis. No pneumothorax or pleural effusion. Lower thoracic compression deformity with kyphosis is unchanged. IMPRESSION: No active cardiopulmonary disease. Electronically Signed   By: Marybelle Killings M.D.   On: 07/25/2017 17:34       Assessment & Plan:    Active Problems:   Encephalopathy   1. Encephalopathy- resolved,  Likely from UTI, patient started on Ceftriaxone. Will continue with Ceftraixone, follow urine culture.  2. UTI- Started on Ceftriaxone as above. Follow urine culture.  3. Chronic diastolic dysfunction- mild volume overload, continue Demadex  4. Diabetes mellitus- will start sliding scale insulin, will hold Metformin, Glipizide. Continue Tyler Aas.  5. Hypertension- continue Metoprolol, Amlodipine, Irbesartan.    DVT Prophylaxis-   Lovenox   AM Labs Ordered, also please review Full Orders  Family Communication: Admission, patients condition and plan of care including tests being ordered have been discussed with the patient and   niece at bedside Her who indicate understanding and agree with the plan and Code Status.  Code Status: DNR  Admission status: Inpatient  Time spent in minutes : 60 minutes   Oswald Hillock M.D on 07/25/2017 at 7:45 PM  Between 7am to 7pm - Pager - [phone]. After 7pm go to [email or web address] - password  [facility]  [facility] - Office  [phone]

## 2017-07-25 NOTE — ED Triage Notes (Signed)
Patient presents with daughter in triage. Patient is alert to person and place, situation, but not time. She states she got up at 5 am and felt shaky. Patient's daughter states pt's blood sugar was 490. Patient is a type 2 diabetic and uses insulin at home.   CBG 291 in triage.

## 2017-07-25 NOTE — ED Provider Notes (Signed)
[facility] EMERGENCY DEPARTMENT Provider Note   CSN: [id number] Arrival date & time: 07/25/17  1431     History   Chief Complaint Chief Complaint  Patient presents with  . Altered Mental Status    HPI [name] is a 84 y.o. female.  HPI  84 year old female presenting with her daughter for evaluation of confusion and hyperglycemia.  Patient states that she woke up around 5:00 this morning and felt very shaky.  Patient felt she may be hypoglycemic so had some juice and other food.  The symptoms did not improve though.  They continued throughout the day daughter noticed that she was not her normal self when speaking with her.  She seemed confused.  She was repeating herself frequently.  She seemed disorganized.  She also has had a cough for the past 2 days.  Her glucose may have been as high as 490 today but daughter states that she gave her changing values throughout the day and questions her reliability.  Past Medical History:  Diagnosis Date  . Abrasion of arm, left    secondary to fall   . Anemia   . Arthritis   . Breast cancer, left ([place])    er/pr +  . Cataract    no surgery as yet,starting of 3 years  . Chronic kidney disease    renal stones, one episode of Lithotripsy  . Cirrhosis (HCC)    Metavir score 4  . Complication of anesthesia   . Diabetes mellitus   . GERD (gastroesophageal reflux disease)   . Hyperlipidemia   . Hypertension    followed by [place] [facility]. relative to  urology study grp.  Doesn't report ever having a stress test   . Mental disorder   . Multiple falls   . Nocturia   . PONV (postoperative nausea and vomiting)   . Seasonal allergies   . Skin cancer    face- melanoma, 2012- surg. excision      Patient Active Problem List   Diagnosis Date Noted  . Diabetes mellitus ([place]) 09/02/2015  . Anemia due to multiple mechanisms 02/17/2015  . Chronic LBP 02/17/2015  . Impaired renal function 02/17/2015  . Abducens nerve weakness 02/17/2015    . S/P knee replacement 12/18/2014  . Osteoarthritis of right knee 12/18/2014  . IDA (iron deficiency anemia) 10/29/2013  . Vitamin B12 deficiency anemia 09/09/2013  . Anemia, iron deficiency 09/09/2013  . Lumbago 08/06/2013  . Difficulty in walking(719.7) 08/06/2013  . Osteopenia 03/11/2013  . Malignant neoplasm of lower-inner quadrant of female breast ([place]) 03/10/2013  . Bilateral leg weakness 09/23/2012  . Acute renal failure ([place]) 07/25/2012  . Dehydration 07/25/2012  . DM type 2, uncontrolled, with neuropathy ([place]) 07/25/2012  . DM (diabetes mellitus), type 2, uncontrolled ([place]) 07/25/2012  . Essential hypertension, benign 07/25/2012  . Generalized weakness 07/25/2012  . Hyperlipidemia   . Chronic kidney disease   . Skin cancer   . Hypertension   . GERD (gastroesophageal reflux disease)   . Breast cancer ([place]) 02/19/2012  . CAP (community acquired pneumonia) 04/19/2011  . Nausea and vomiting 04/19/2011  . HTN (hypertension) 04/19/2011  . DM type 2 (diabetes mellitus, type 2) ([place]) 04/19/2011  . Arthritis 04/19/2011    Past Surgical History:  Procedure Laterality Date  . ABDOMINAL HYSTERECTOMY    . BACK SURGERY     x4 back surgery to include rods & screws  . Last in May 2014  . BREAST LUMPECTOMY WITH NEEDLE LOCALIZATION AND AXILLARY  SENTINEL LYMPH NODE BX  03/12/2012   Procedure: BREAST LUMPECTOMY WITH NEEDLE LOCALIZATION AND AXILLARY SENTINEL LYMPH NODE BX;  Surgeon: Merrie Roof, MD;  Location: [facility];  Service: General;  Laterality: Left;  . BREAST SURGERY     lumpectomy x2/left   . CHOLECYSTECTOMY    . COLONOSCOPY N/A 11/20/2013   Dr.Rourk- diverticula was found in the left colon. o/w normal rectal, colonic and terminal ileal mucosa  . ESOPHAGOGASTRODUODENOSCOPY N/A 11/20/2013   Dr.Rourk- abnormal mucosa was found. diffuse snake skinning and friability of the gastric mucosa. patent pylorus. abnormal-appearing first, second and third portion of the duodenum. 22mm  gastric nodule at the GE junction. stomach bx= minimal chronic infammation, GE junction bx= polypoid fragments of gastric cardia type mucosa w/mod chronic inflammation and foveolar hyperplasia  . EYE SURGERY     cataract surgery bilat   . FRACTURE SURGERY     1975- ORIF- L ankle   . GIVENS CAPSULE STUDY N/A 02/24/2014   Procedure: GIVENS CAPSULE STUDY;  Surgeon: Daneil Dolin, MD;  [facility];  Service: Endoscopy;  Laterality: N/A;  . RE-EXCISION OF BREAST LUMPECTOMY  03/22/2012   Procedure: RE-EXCISION OF BREAST LUMPECTOMY;  Surgeon: Merrie Roof, MD;  Location: [facility];  Service: General;  Laterality: Left;  re-excision left breast lumpectomy cavity  ER+, PR+  . ROTATOR CUFF REPAIR Bilateral   . SHOULDER SURGERY    . SKIN BIOPSY    . TOTAL KNEE ARTHROPLASTY Right 12/18/2014   Procedure: RIGHT TOTAL KNEE ARTHROPLASTY;  Surgeon: Sydnee Cabal, MD;  Location: [facility];  Service: Orthopedics;  Laterality: Right;  . TUBAL LIGATION       OB History    Gravida  2   Para  2   Term  2   Preterm      AB      Living  2     SAB      TAB      Ectopic      Multiple      Live Births               Home Medications    Prior to Admission medications   Medication Sig Start Date End Date Taking? Authorizing Provider  ALPHAGAN P 0.1 % SOLN Place 1 drop into both eyes 2 (two) times daily.  08/01/16  Yes [provider]  amLODipine (NORVASC) 5 MG tablet Take 5 mg by mouth every morning.    Yes [provider]  anastrozole (ARIMIDEX) 1 MG tablet Take 1 mg by mouth daily.   Yes [provider]  aspirin EC 81 MG tablet Take 81 mg by mouth daily.   Yes [provider]  atorvastatin (LIPITOR) 10 MG tablet Take 10 mg by mouth every evening.    Yes [provider]  Bacillus Coagulans-Inulin (PROBIOTIC FORMULA PO) Take 1 capsule by mouth daily.   Yes [provider]  cyanocobalamin (,VITAMIN B-12,) 1000  MCG/ML injection Inject 1,000 mcg into the muscle every 30 (thirty) days.  05/14/17  Yes [provider]  dorzolamide (TRUSOPT) 2 % ophthalmic solution PLACE ONE DROP IN [facility] EYE TWICE DAILY 06/12/17  Yes [provider]  EPIPEN 2-PAK 0.3 MG/0.3ML SOAJ injection Inject 0.3 mg into the muscle once.  07/01/15  Yes [provider]  gabapentin (NEURONTIN) 600 MG tablet Take 600 mg by mouth at bedtime.  05/14/17  Yes [provider]  glipiZIDE (GLUCOTROL) 5  MG tablet Take 5 mg by mouth daily before breakfast.  07/31/16  Yes [provider]  hydrALAZINE (APRESOLINE) 25 MG tablet Take 25-50 mg by mouth See admin instructions. 50mg  in the morning and 25mg  at bedtime   Yes [provider]  irbesartan (AVAPRO) 150 MG tablet Take 150 mg by mouth daily.   Yes [provider]  latanoprost (XALATAN) 0.005 % ophthalmic solution Place 1 drop into both eyes at bedtime.  08/10/15  Yes [provider]  metFORMIN (GLUCOPHAGE) 1000 MG tablet Take 1,000 mg by mouth daily with breakfast.  07/31/16  Yes [provider]  metoprolol succinate (TOPROL-XL) 100 MG 24 hr tablet Take 100 mg by mouth every morning. Take with or immediately following a meal.   Yes [provider]  mirtazapine (REMERON) 7.5 MG tablet Take 7.5 mg by mouth at bedtime.  08/04/15  Yes [provider]  MYRBETRIQ 25 MG TB24 tablet Take 25 mg by mouth daily.  07/16/17  Yes [provider]  omeprazole (PRILOSEC) 20 MG capsule Take 20 mg by mouth daily.   Yes [provider]  Oxycodone HCl 10 MG TABS Take 10 mg by mouth 3 (three) times daily.  08/26/15  Yes [provider]  torsemide (DEMADEX) 20 MG tablet Take 20 mg by mouth 2 (two) times daily.  06/15/15  Yes [provider]  TRESIBA FLEXTOUCH 100 UNIT/ML SOPN Inject 70 Units into the skin every morning.  08/04/15  Yes [provider]    Family History Family History  Problem  Relation Age of Onset  . Heart disease Father   . Colon cancer Neg Hx     Social History Social History   Tobacco Use  . Smoking status: Never Smoker  . Smokeless tobacco: Never Used  Substance Use Topics  . Alcohol use: No  . Drug use: No     Allergies   Bee venom; Hydromorphone hcl; Demerol; and Other   Review of Systems Review of Systems  All systems reviewed and negative, other than as noted in HPI.  Physical Exam Updated Vital Signs BP (!) 156/68   Pulse 82   Temp 100.2 F (37.9 C) (Oral)   Resp (!) 21   Ht 5\' 1"  (1.549 m)   Wt 81.6 kg (180 lb)   SpO2 94%   BMI 34.01 kg/m   Physical Exam  Constitutional: She appears well-developed and well-nourished. No distress.  HENT:  Head: Normocephalic and atraumatic.  Eyes: Conjunctivae are normal. Right eye exhibits no discharge. Left eye exhibits no discharge.  Neck: Neck supple.  Cardiovascular: Normal rate, regular rhythm and normal heart sounds. Exam reveals no gallop and no friction rub.  No murmur heard. Pulmonary/Chest: Effort normal and breath sounds normal. No respiratory distress.  Abdominal: Soft. She exhibits no distension. There is no tenderness.  Musculoskeletal: She exhibits no edema or tenderness.  Neurological: She is alert.  Skin: Skin is warm and dry.  Psychiatric: She has a normal mood and affect.  Nursing note and vitals reviewed.    ED Treatments / Results  Labs (all labs ordered are listed, but only abnormal results are displayed) Labs Reviewed  URINE CULTURE - Abnormal; Notable for the following components:      Result Value   Culture >=100,000 COLONIES/mL ESCHERICHIA COLI (*)    All other components within normal limits  MRSA PCR SCREENING - Abnormal; Notable for the following components:   MRSA by PCR POSITIVE (*)    All other  components within normal limits  COMPREHENSIVE METABOLIC PANEL - Abnormal; Notable for the following components:   Chloride 97 (*)    Glucose, Bld 281  (*)    BUN 39 (*)    Creatinine, Ser 1.91 (*)    Total Protein 8.2 (*)    AST 94 (*)    Alkaline Phosphatase 151 (*)    GFR calc non Af Amer 24 (*)    GFR calc Af Amer 27 (*)    All other components within normal limits  CBC - Abnormal; Notable for the following components:   Hemoglobin 11.4 (*)    MCHC 29.9 (*)    Platelets 141 (*)    All other components within normal limits  URINALYSIS, ROUTINE W REFLEX MICROSCOPIC - Abnormal; Notable for the following components:   APPearance HAZY (*)    Glucose, UA >=500 (*)    Leukocytes, UA TRACE (*)    Bacteria, UA RARE (*)    All other components within normal limits  LACTIC ACID, PLASMA - Abnormal; Notable for the following components:   Lactic Acid, Venous 2.4 (*)    All other components within normal limits  LACTIC ACID, PLASMA - Abnormal; Notable for the following components:   Lactic Acid, Venous 3.7 (*)    All other components within normal limits  HEMOGLOBIN A1C - Abnormal; Notable for the following components:   Hgb A1c MFr Bld 9.1 (*)    All other components within normal limits  CBC - Abnormal; Notable for the following components:   RBC 3.75 (*)    Hemoglobin 10.1 (*)    HCT 34.4 (*)    MCHC 29.4 (*)    Platelets 107 (*)    All other components within normal limits  COMPREHENSIVE METABOLIC PANEL - Abnormal; Notable for the following components:   Glucose, Bld 162 (*)    BUN 33 (*)    Creatinine, Ser 1.62 (*)    Calcium 8.7 (*)    Total Protein 6.4 (*)    Albumin 3.1 (*)    AST 52 (*)    GFR calc non Af Amer 29 (*)    GFR calc Af Amer 33 (*)    All other components within normal limits  GLUCOSE, CAPILLARY - Abnormal; Notable for the following components:   Glucose-Capillary 162 (*)    All other components within normal limits  GLUCOSE, CAPILLARY - Abnormal; Notable for the following components:   Glucose-Capillary 161 (*)    All other components within normal limits  GLUCOSE, CAPILLARY - Abnormal; Notable for  the following components:   Glucose-Capillary 289 (*)    All other components within normal limits  GLUCOSE, CAPILLARY - Abnormal; Notable for the following components:   Glucose-Capillary 206 (*)    All other components within normal limits  GLUCOSE, CAPILLARY - Abnormal; Notable for the following components:   Glucose-Capillary 100 (*)    All other components within normal limits  GLUCOSE, CAPILLARY - Abnormal; Notable for the following components:   Glucose-Capillary 216 (*)    All other components within normal limits  CBG MONITORING, ED - Abnormal; Notable for the following components:   Glucose-Capillary 291 (*)    All other components within normal limits  CBG MONITORING, ED - Abnormal; Notable for the following components:   Glucose-Capillary 33 (*)    All other components within normal limits  CBG MONITORING, ED - Abnormal; Notable for the following components:   Glucose-Capillary 373 (*)    All other components  within normal limits  CULTURE, BLOOD (ROUTINE X 2)  CULTURE, BLOOD (ROUTINE X 2)  GLUCOSE, CAPILLARY  GLUCOSE, CAPILLARY    EKG None  Radiology Dg Chest 2 View  Result Date: 07/25/2017 CLINICAL DATA:  Nonproductive cough EXAM: CHEST - 2 VIEW COMPARISON:  04/28/2016 FINDINGS: Normal heart size. Lungs are under aerated with bibasilar atelectasis. No pneumothorax or pleural effusion. Lower thoracic compression deformity with kyphosis is unchanged. IMPRESSION: No active cardiopulmonary disease. Electronically Signed   By: Marybelle Killings M.D.   On: 07/25/2017 17:34    Procedures Procedures (including critical care time)  Medications Ordered in ED Medications  azithromycin (ZITHROMAX) tablet 500 mg (has no administration in time range)  cefTRIAXone (ROCEPHIN) 1 g in sodium chloride 0.9 % 100 mL IVPB (0 g Intravenous Stopped 07/25/17 1852)  sodium chloride 0.9 % bolus 1,000 mL (1,000 mLs Intravenous New Bag/Given 07/25/17 1808)     Initial Impression / Assessment  and Plan / ED Course  I have reviewed the triage vital signs and the nursing notes.  Pertinent labs & imaging results that were available during my care of the patient were reviewed by me and considered in my medical decision making (see chart for details).    83yF with encephalopathy. Likely from UTI. Abx. Discussed with hospitalist. Admission.   Final Clinical Impressions(s) / ED Diagnoses   Final diagnoses:  Altered mental status, unspecified altered mental status type  Urinary tract infection without hematuria, site unspecified    ED Discharge Orders    None       Virgel Manifold, MD 07/27/17 [phone]

## 2017-07-26 LAB — GLUCOSE, CAPILLARY
GLUCOSE-CAPILLARY: 161 mg/dL — AB (ref 65–99)
GLUCOSE-CAPILLARY: 206 mg/dL — AB (ref 65–99)
GLUCOSE-CAPILLARY: 78 mg/dL (ref 65–99)
Glucose-Capillary: 162 mg/dL — ABNORMAL HIGH (ref 65–99)
Glucose-Capillary: 94 mg/dL (ref 65–99)
Glucose-Capillary: 289 mg/dL — ABNORMAL HIGH (ref 65–99)

## 2017-07-26 LAB — COMPREHENSIVE METABOLIC PANEL
ALBUMIN: 3.1 g/dL — AB (ref 3.5–5.0)
ALT: 39 U/L (ref 14–54)
AST: 52 U/L — AB (ref 15–41)
Alkaline Phosphatase: 103 U/L (ref 38–126)
Anion gap: 6 (ref 5–15)
BUN: 33 mg/dL — AB (ref 6–20)
CHLORIDE: 102 mmol/L (ref 101–111)
CO2: 28 mmol/L (ref 22–32)
CREATININE: 1.62 mg/dL — AB (ref 0.44–1.00)
Calcium: 8.7 mg/dL — ABNORMAL LOW (ref 8.9–10.3)
GFR calc Af Amer: 33 mL/min — ABNORMAL LOW (ref >60)
GFR calc non Af Amer: 29 mL/min — ABNORMAL LOW (ref >60)
GLUCOSE: 162 mg/dL — AB (ref 65–99)
POTASSIUM: 4.4 mmol/L (ref 3.5–5.1)
SODIUM: 136 mmol/L (ref 135–145)
Total Bilirubin: 0.8 mg/dL (ref 0.3–1.2)
Total Protein: 6.4 g/dL — ABNORMAL LOW (ref 6.5–8.1)

## 2017-07-26 LAB — CBC
HCT: 34.4 % — ABNORMAL LOW (ref 36.0–46.0)
Hemoglobin: 10.1 g/dL — ABNORMAL LOW (ref 12.0–15.0)
MCH: 26.9 pg (ref 26.0–34.0)
MCHC: 29.4 g/dL — ABNORMAL LOW (ref 30.0–36.0)
MCV: 91.7 fL (ref 78.0–100.0)
PLATELETS: 107 10*3/uL — AB (ref 150–400)
RBC: 3.75 MIL/uL — AB (ref 3.87–5.11)
RDW: 14.2 % (ref 11.5–15.5)
WBC: 6.1 10*3/uL (ref 4.0–10.5)

## 2017-07-26 LAB — HEMOGLOBIN A1C
HEMOGLOBIN A1C: 9.1 % — AB (ref 4.8–5.6)
MEAN PLASMA GLUCOSE: 214.47 mg/dL

## 2017-07-26 LAB — MRSA PCR SCREENING: MRSA by PCR: POSITIVE — AB

## 2017-07-26 MED ORDER — IPRATROPIUM-ALBUTEROL 0.5-2.5 (3) MG/3ML IN SOLN: 3.0000 mL | Freq: Four times a day (QID) | RESPIRATORY_TRACT | Status: DC | PRN

## 2017-07-26 MED ORDER — CHLORHEXIDINE GLUCONATE CLOTH 2 % EX PADS
6.0000 | MEDICATED_PAD | Freq: Every day | CUTANEOUS | Status: DC
  Administered 2017-07-26 – 2017-07-27 (×2): 6 via TOPICAL

## 2017-07-26 MED ORDER — GABAPENTIN 300 MG PO CAPS
600.0000 mg | ORAL_CAPSULE | Freq: Every day | ORAL | Status: DC
  Administered 2017-07-26 (×2): 600 mg via ORAL
  Filled 2017-07-26 (×2): qty 2

## 2017-07-26 MED ORDER — MUPIROCIN 2 % EX OINT
1.0000 "application " | TOPICAL_OINTMENT | Freq: Two times a day (BID) | CUTANEOUS | Status: DC
  Administered 2017-07-26 – 2017-07-27 (×3): 1 via NASAL
  Filled 2017-07-26: qty 22

## 2017-07-26 NOTE — Progress Notes (Signed)
Subjective: She came to the hospital with altered mental status.  She is much better now.  She has what appears to be a urinary tract infection.  Her lactic acid level was 3.7 but she does not appear to be septic.  No other new complaints.  Objective: Vital signs in last 24 hours: Temp:  [97.7 F (36.5 C)-100.2 F (37.9 C)] 97.7 F (36.5 C) (05/30 0648) Pulse Rate:  [64-88] 65 (05/30 0648) Resp:  [14-23] 20 (05/30 0648) BP: (102-156)/(50-113) 136/57 (05/30 0648) SpO2:  [88 %-98 %] 98 % (05/30 0648) Weight:  [81.6 kg (180 lb)-88.7 kg (195 lb 8.8 oz)] 88.7 kg (195 lb 8.8 oz) (05/30 5329) Weight change:     Intake/Output from previous day: 05/29 0701 - 05/30 0700 In: 1130 [I.V.:30; IV Piggyback:1100] Out: 600 [Urine:600]  PHYSICAL EXAM General appearance: alert, cooperative and no distress Resp: clear to auscultation bilaterally Cardio: regular rate and rhythm, S1, S2 normal, no murmur, click, rub or gallop GI: soft, non-tender; bowel sounds normal; no masses,  no organomegaly Extremities: extremities normal, atraumatic, no cyanosis or edema  Lab Results:  Results for orders placed or performed during the hospital encounter of 07/25/17 (from the past 48 hour(s))  CBG monitoring, ED     Status: Abnormal   Collection Time: 07/25/17  3:42 PM  Result Value Ref Range   Glucose-Capillary 291 (H) 65 - 99 mg/dL  Comprehensive metabolic panel     Status: Abnormal   Collection Time: 07/25/17  3:58 PM  Result Value Ref Range   Sodium 135 135 - 145 mmol/L   Potassium 4.9 3.5 - 5.1 mmol/L   Chloride 97 (L) 101 - 111 mmol/L   CO2 26 22 - 32 mmol/L   Glucose, Bld 281 (H) 65 - 99 mg/dL   BUN 39 (H) 6 - 20 mg/dL   Creatinine, Ser 1.91 (H) 0.44 - 1.00 mg/dL   Calcium 9.4 8.9 - 10.3 mg/dL   Total Protein 8.2 (H) 6.5 - 8.1 g/dL   Albumin 4.0 3.5 - 5.0 g/dL   AST 94 (H) 15 - 41 U/L   ALT 52 14 - 54 U/L   Alkaline Phosphatase 151 (H) 38 - 126 U/L   Total Bilirubin 1.0 0.3 - 1.2 mg/dL   GFR calc non Af Amer 24 (L) >60 mL/min   GFR calc Af Amer 27 (L) >60 mL/min    Comment: (NOTE) The eGFR has been calculated using the CKD EPI equation. This calculation has not been validated in all clinical situations. eGFR's persistently <60 mL/min signify possible Chronic Kidney Disease.    Anion gap 12 5 - 15    Comment: Performed at [facility], [address] [place], [place] [address]  CBC     Status: Abnormal   Collection Time: 07/25/17  3:58 PM  Result Value Ref Range   WBC 8.9 4.0 - 10.5 K/uL   RBC 4.19 3.87 - 5.11 MIL/uL   Hemoglobin 11.4 (L) 12.0 - 15.0 g/dL   HCT 38.1 36.0 - 46.0 %   MCV 90.9 78.0 - 100.0 fL   MCH 27.2 26.0 - 34.0 pg   MCHC 29.9 (L) 30.0 - 36.0 g/dL   RDW 14.0 11.5 - 15.5 %   Platelets 141 (L) 150 - 400 K/uL    Comment: Performed at [facility], [address] [place], [place] [address]  Urinalysis, Routine w reflex microscopic     Status: Abnormal   Collection Time: 07/25/17  3:59 PM  Result Value  Ref Range   Color, Urine YELLOW YELLOW   APPearance HAZY (A) CLEAR   Specific Gravity, Urine 1.014 1.005 - 1.030   pH 5.0 5.0 - 8.0   Glucose, UA >=500 (A) NEGATIVE mg/dL   Hgb urine dipstick NEGATIVE NEGATIVE   Bilirubin Urine NEGATIVE NEGATIVE   Ketones, ur NEGATIVE NEGATIVE mg/dL   Protein, ur NEGATIVE NEGATIVE mg/dL   Nitrite NEGATIVE NEGATIVE   Leukocytes, UA TRACE (A) NEGATIVE   RBC / HPF 0-5 0 - 5 RBC/hpf   WBC, UA 21-50 0 - 5 WBC/hpf   Bacteria, UA RARE (A) NONE SEEN   Squamous Epithelial / LPF 0-5 0 - 5   Hyaline Casts, UA PRESENT     Comment: Performed at [facility], [address], [place], [place] [address]  Lactic acid, plasma     Status: Abnormal   Collection Time: 07/25/17  4:50 PM  Result Value Ref Range   Lactic Acid, Venous 2.4 (HH) 0.5 - 1.9 mmol/L    Comment: CRITICAL RESULT CALLED TO, READ BACK BY AND VERIFIED WITH: EASTWOOD,J ON 07/25/17 AT 1905 BY LOY,C Performed at [facility], [address], [place], [place]  [address]   Blood culture (routine x 2)     Status: None (Preliminary result)   Collection Time: 07/25/17  4:50 PM  Result Value Ref Range   Specimen Description RIGHT ANTECUBITAL    Special Requests      BOTTLES DRAWN AEROBIC AND ANAEROBIC Blood Culture adequate volume   Culture      NO GROWTH < 12 HOURS Performed at [facility], [address], [place], [place] [address]    Report Status PENDING   Blood culture (routine x 2)     Status: None (Preliminary result)   Collection Time: 07/25/17  4:52 PM  Result Value Ref Range   Specimen Description BLOOD RIGHT WRIST    Special Requests      BOTTLES DRAWN AEROBIC AND ANAEROBIC Blood Culture adequate volume   Culture      NO GROWTH < 12 HOURS Performed at [facility], [address], [place], [place] [address]    Report Status PENDING   Lactic acid, plasma     Status: Abnormal   Collection Time: 07/25/17  6:34 PM  Result Value Ref Range   Lactic Acid, Venous 3.7 (HH) 0.5 - 1.9 mmol/L    Comment: CRITICAL RESULT CALLED TO, READ BACK BY AND VERIFIED WITH: LONG,H ON 07/25/17 AT 1950 BY LOY,C Performed at [facility], [address], [place], [place] [address]   CBG monitoring, ED     Status: Abnormal   Collection Time: 07/25/17 10:11 PM  Result Value Ref Range   Glucose-Capillary 33 (LL) 65 - 99 mg/dL   Comment 1 Notify RN    Comment 2 Document in Chart   CBG monitoring, ED     Status: Abnormal   Collection Time: 07/25/17 10:20 PM  Result Value Ref Range   Glucose-Capillary 373 (H) 65 - 99 mg/dL  MRSA PCR Screening     Status: Abnormal   Collection Time: 07/25/17 11:20 PM  Result Value Ref Range   MRSA by PCR POSITIVE (A) NEGATIVE    Comment:        The GeneXpert MRSA Assay (FDA approved for NASAL specimens only), is one component of a comprehensive MRSA colonization surveillance program. It is not intended to diagnose MRSA infection nor to guide or monitor treatment for MRSA infections. RESULT CALLED TO, READ BACK BY AND  VERIFIED WITH: JACKSON N. AT [phone] ON [phone] BY THOMPSON S. Performed at [facility], [address] [place], [place] [address]   Glucose, capillary     Status: None   Collection Time: 07/26/17 12:05 AM  Result Value Ref Range   Glucose-Capillary 78 65 - 99 mg/dL  Glucose, capillary     Status: Abnormal   Collection Time: 07/26/17  5:05 AM  Result Value Ref Range   Glucose-Capillary 162 (H) 65 - 99 mg/dL  CBC     Status: Abnormal   Collection Time: 07/26/17  5:30 AM  Result Value Ref Range   WBC 6.1 4.0 - 10.5 K/uL   RBC 3.75 (L) 3.87 - 5.11 MIL/uL   Hemoglobin 10.1 (L) 12.0 - 15.0 g/dL   HCT 34.4 (L) 36.0 - 46.0 %   MCV 91.7 78.0 - 100.0 fL   MCH 26.9 26.0 - 34.0 pg   MCHC 29.4 (L) 30.0 - 36.0 g/dL   RDW 14.2 11.5 - 15.5 %   Platelets 107 (L) 150 - 400 K/uL    Comment: SPECIMEN CHECKED FOR CLOTS REPEATED TO VERIFY Performed at [facility], [address] [place], [place] [address]   Comprehensive metabolic panel     Status: Abnormal   Collection Time: 07/26/17  5:30 AM  Result Value Ref Range   Sodium 136 135 - 145 mmol/L   Potassium 4.4 3.5 - 5.1 mmol/L   Chloride 102 101 - 111 mmol/L   CO2 28 22 - 32 mmol/L   Glucose, Bld 162 (H) 65 - 99 mg/dL   BUN 33 (H) 6 - 20 mg/dL   Creatinine, Ser 1.62 (H) 0.44 - 1.00 mg/dL   Calcium 8.7 (L) 8.9 - 10.3 mg/dL   Total Protein 6.4 (L) 6.5 - 8.1 g/dL   Albumin 3.1 (L) 3.5 - 5.0 g/dL   AST 52 (H) 15 - 41 U/L   ALT 39 14 - 54 U/L   Alkaline Phosphatase 103 38 - 126 U/L   Total Bilirubin 0.8 0.3 - 1.2 mg/dL   GFR calc non Af Amer 29 (L) >60 mL/min   GFR calc Af Amer 33 (L) >60 mL/min    Comment: (NOTE) The eGFR has been calculated using the CKD EPI equation. This calculation has not been validated in all clinical situations. eGFR's persistently <60 mL/min signify possible Chronic Kidney Disease.    Anion gap 6 5 - 15    Comment: Performed at [facility], [address] [place], [place] [address]  Glucose, capillary      Status: None   Collection Time: 07/26/17  7:33 AM  Result Value Ref Range   Glucose-Capillary 94 65 - 99 mg/dL   Comment 1 Notify RN    Comment 2 Document in Chart     ABGS No results for input(s): PHART, PO2ART, TCO2, HCO3 in the last 72 hours.  Invalid input(s): PCO2 CULTURES Recent Results (from the past 240 hour(s))  Blood culture (routine x 2)     Status: None (Preliminary result)   Collection Time: 07/25/17  4:50 PM  Result Value Ref Range Status   Specimen Description RIGHT ANTECUBITAL  Final   Special Requests   Final    BOTTLES DRAWN AEROBIC AND ANAEROBIC Blood Culture adequate volume   Culture   Final    NO GROWTH < 12 HOURS Performed at [facility], [address] [place], [place] [address]    Report Status PENDING  Incomplete  Blood culture (routine x 2)  Status: None (Preliminary result)   Collection Time: 07/25/17  4:52 PM  Result Value Ref Range Status   Specimen Description BLOOD RIGHT WRIST  Final   Special Requests   Final    BOTTLES DRAWN AEROBIC AND ANAEROBIC Blood Culture adequate volume   Culture   Final    NO GROWTH < 12 HOURS Performed at [facility], [address] [place], [place] [address]    Report Status PENDING  Incomplete  MRSA PCR Screening     Status: Abnormal   Collection Time: 07/25/17 11:20 PM  Result Value Ref Range Status   MRSA by PCR POSITIVE (A) NEGATIVE Final    Comment:        The GeneXpert MRSA Assay (FDA approved for NASAL specimens only), is one component of a comprehensive MRSA colonization surveillance program. It is not intended to diagnose MRSA infection nor to guide or monitor treatment for MRSA infections. RESULT CALLED TO, READ BACK BY AND VERIFIED WITH: JACKSON N. AT [id number] ON [id number] BY THOMPSON S. Performed at [facility], [address] [place], [place] [address]    Studies/Results: Dg Chest 2 View  Result Date: 07/25/2017 CLINICAL DATA:  Nonproductive cough EXAM: CHEST - 2 VIEW COMPARISON:   04/28/2016 FINDINGS: Normal heart size. Lungs are under aerated with bibasilar atelectasis. No pneumothorax or pleural effusion. Lower thoracic compression deformity with kyphosis is unchanged. IMPRESSION: No active cardiopulmonary disease. Electronically Signed   By: Marybelle Killings M.D.   On: 07/25/2017 17:34    Medications:  Prior to Admission:  Medications Prior to Admission  Medication Sig Dispense Refill Last Dose  . ALPHAGAN P 0.1 % SOLN Place 1 drop into both eyes 2 (two) times daily.    07/25/2017 at Unknown time  . amLODipine (NORVASC) 5 MG tablet Take 5 mg by mouth every morning.    07/25/2017 at Unknown time  . anastrozole (ARIMIDEX) 1 MG tablet Take 1 mg by mouth daily.   07/25/2017 at Unknown time  . aspirin EC 81 MG tablet Take 81 mg by mouth daily.   07/25/2017 at Unknown time  . atorvastatin (LIPITOR) 10 MG tablet Take 10 mg by mouth every evening.    07/24/2017 at Unknown time  . Bacillus Coagulans-Inulin (PROBIOTIC FORMULA PO) Take 1 capsule by mouth daily.   07/25/2017 at Unknown time  . cyanocobalamin (,VITAMIN B-12,) 1000 MCG/ML injection Inject 1,000 mcg into the muscle every 30 (thirty) days.    Past Month at Unknown time  . dorzolamide (TRUSOPT) 2 % ophthalmic solution PLACE ONE DROP IN [facility] EYE TWICE DAILY  4 07/25/2017 at Unknown time  . EPIPEN 2-PAK 0.3 MG/0.3ML SOAJ injection Inject 0.3 mg into the muscle once.    UNKNOWN  . gabapentin (NEURONTIN) 600 MG tablet Take 600 mg by mouth at bedtime.    07/24/2017 at Unknown time  . glipiZIDE (GLUCOTROL) 5 MG tablet Take 5 mg by mouth daily before breakfast.    07/25/2017 at Unknown time  . hydrALAZINE (APRESOLINE) 25 MG tablet Take 25-50 mg by mouth See admin instructions. '50mg'$  in the morning and '25mg'$  at bedtime   07/25/2017 at Unknown time  . irbesartan (AVAPRO) 150 MG tablet Take 150 mg by mouth daily.   07/25/2017 at Unknown time  . latanoprost (XALATAN) 0.005 % ophthalmic solution Place 1 drop into both eyes at bedtime.    07/24/2017  at Unknown time  . metFORMIN (GLUCOPHAGE) 1000 MG tablet Take 1,000 mg by mouth daily with breakfast.  07/25/2017 at Unknown time  . metoprolol succinate (TOPROL-XL) 100 MG 24 hr tablet Take 100 mg by mouth every morning. Take with or immediately following a meal.   07/25/2017 at [place]  . mirtazapine (REMERON) 7.5 MG tablet Take 7.5 mg by mouth at bedtime.    07/24/2017 at Unknown time  . MYRBETRIQ 25 MG TB24 tablet Take 25 mg by mouth daily.    07/25/2017 at Unknown time  . omeprazole (PRILOSEC) 20 MG capsule Take 20 mg by mouth daily.   07/25/2017 at Unknown time  . Oxycodone HCl 10 MG TABS Take 10 mg by mouth 3 (three) times daily.    07/25/2017 at Unknown time  . torsemide (DEMADEX) 20 MG tablet Take 20 mg by mouth 2 (two) times daily.    07/25/2017 at Unknown time  . TRESIBA FLEXTOUCH 100 UNIT/ML SOPN Inject 70 Units into the skin every morning.    07/25/2017 at Unknown time   Scheduled: . amLODipine  5 mg Oral q morning - 10a  . anastrozole  1 mg Oral Daily  . aspirin EC  81 mg Oral Daily  . atorvastatin  10 mg Oral QPM  . brimonidine  1 drop Both Eyes BID  . Chlorhexidine Gluconate Cloth  6 each Topical Q0600  . dorzolamide  1 drop Both Eyes BID  . enoxaparin (LOVENOX) injection  30 mg Subcutaneous Q24H  . gabapentin  600 mg Oral QHS  . guaiFENesin  600 mg Oral BID  . hydrALAZINE  25 mg Oral QHS  . hydrALAZINE  50 mg Oral Daily  . insulin aspart  0-9 Units Subcutaneous TID WC  . irbesartan  150 mg Oral Daily  . latanoprost  1 drop Both Eyes QHS  . metoprolol succinate  100 mg Oral q morning - 10a  . mirabegron ER  25 mg Oral Daily  . mirtazapine  7.5 mg Oral QHS  . mupirocin ointment  1 application Nasal BID  . oxyCODONE  10 mg Oral TID  . pantoprazole  40 mg Oral Daily  . torsemide  20 mg Oral BID   Continuous: . sodium chloride 10 mL/hr at 07/26/17 0400  . cefTRIAXone (ROCEPHIN)  IV     YQI:[id number] **OR** acetaminophen, ipratropium-albuterol, ondansetron **OR**  ondansetron (ZOFRAN) IV  Assesment: She has metabolic encephalopathy from urinary tract infection that is much better.  Although she had elevated lactic acid level I do not believe she was septic or even had systemic inflammatory response syndrome.  She looks better.  At baseline she has diabetes and she is on sliding scale.  She has chronic low back pain.  She is had mild cognitive impairment Active Problems:   Encephalopathy    Plan: Continue treatments.  Continue IV antibiotics today probable discharge tomorrow    LOS: 1 day   Aviraj Kentner L 07/26/2017, 9:03 AM

## 2017-07-27 LAB — GLUCOSE, CAPILLARY
Glucose-Capillary: 100 mg/dL — ABNORMAL HIGH (ref 65–99)
Glucose-Capillary: 216 mg/dL — ABNORMAL HIGH (ref 65–99)

## 2017-07-27 MED ORDER — CEFDINIR 300 MG PO CAPS: 300.0000 mg | ORAL_CAPSULE | Freq: Two times a day (BID) | ORAL | 0 refills | Status: DC

## 2017-07-27 NOTE — Progress Notes (Signed)
Subjective: She says she feels much better and wants to go home.  Her mental status is normal.  She is coughing and coughing up some sputum.  Objective: Vital signs in last 24 hours: Temp:  [98.1 F (36.7 C)-98.6 F (37 C)] 98.3 F (36.8 C) (05/31 0617) Pulse Rate:  [68-74] 68 (05/31 0617) Resp:  [16-20] 16 (05/31 0617) BP: (111-132)/(52-62) 132/62 (05/31 0617) SpO2:  [92 %-94 %] 92 % (05/31 0617) Weight change:  Last BM Date: 07/26/17  Intake/Output from previous day: 05/30 0701 - 05/31 0700 In: 740 [P.O.:480; I.V.:260] Out: -   PHYSICAL EXAM General appearance: alert, cooperative and no distress Resp: rhonchi bilaterally Cardio: regular rate and rhythm, S1, S2 normal, no murmur, click, rub or gallop GI: soft, non-tender; bowel sounds normal; no masses,  no organomegaly Extremities: extremities normal, atraumatic, no cyanosis or edema  Lab Results:  Results for orders placed or performed during the hospital encounter of 07/25/17 (from the past 48 hour(s))  CBG monitoring, ED     Status: Abnormal   Collection Time: 07/25/17  3:42 PM  Result Value Ref Range   Glucose-Capillary 291 (H) 65 - 99 mg/dL  Comprehensive metabolic panel     Status: Abnormal   Collection Time: 07/25/17  3:58 PM  Result Value Ref Range   Sodium 135 135 - 145 mmol/L   Potassium 4.9 3.5 - 5.1 mmol/L   Chloride 97 (L) 101 - 111 mmol/L   CO2 26 22 - 32 mmol/L   Glucose, Bld 281 (H) 65 - 99 mg/dL   BUN 39 (H) 6 - 20 mg/dL   Creatinine, Ser 1.91 (H) 0.44 - 1.00 mg/dL   Calcium 9.4 8.9 - 10.3 mg/dL   Total Protein 8.2 (H) 6.5 - 8.1 g/dL   Albumin 4.0 3.5 - 5.0 g/dL   AST 94 (H) 15 - 41 U/L   ALT 52 14 - 54 U/L   Alkaline Phosphatase 151 (H) 38 - 126 U/L   Total Bilirubin 1.0 0.3 - 1.2 mg/dL   GFR calc non Af Amer 24 (L) >60 mL/min   GFR calc Af Amer 27 (L) >60 mL/min    Comment: (NOTE) The eGFR has been calculated using the CKD EPI equation. This calculation has not been validated in all  clinical situations. eGFR's persistently <60 mL/min signify possible Chronic Kidney Disease.    Anion gap 12 5 - 15    Comment: Performed at [facility], [address], [place], [place] [address]  CBC     Status: Abnormal   Collection Time: 07/25/17  3:58 PM  Result Value Ref Range   WBC 8.9 4.0 - 10.5 K/uL   RBC 4.19 3.87 - 5.11 MIL/uL   Hemoglobin 11.4 (L) 12.0 - 15.0 g/dL   HCT 38.1 36.0 - 46.0 %   MCV 90.9 78.0 - 100.0 fL   MCH 27.2 26.0 - 34.0 pg   MCHC 29.9 (L) 30.0 - 36.0 g/dL   RDW 14.0 11.5 - 15.5 %   Platelets 141 (L) 150 - 400 K/uL    Comment: Performed at [facility], [address] [place], [place] [address]  Hemoglobin A1c     Status: Abnormal   Collection Time: 07/25/17  3:58 PM  Result Value Ref Range   Hgb A1c MFr Bld 9.1 (H) 4.8 - 5.6 %    Comment: (NOTE) Pre diabetes:          5.7%-6.4% Diabetes:              >  6.4% Glycemic control for   <7.0% adults with diabetes    Mean Plasma Glucose 214.47 mg/dL    Comment: Performed at [facility] [address] [place], [place] [address]  Urinalysis, Routine w reflex microscopic     Status: Abnormal   Collection Time: 07/25/17  3:59 PM  Result Value Ref Range   Color, Urine YELLOW YELLOW   APPearance HAZY (A) CLEAR   Specific Gravity, Urine 1.014 1.005 - 1.030   pH 5.0 5.0 - 8.0   Glucose, UA >=500 (A) NEGATIVE mg/dL   Hgb urine dipstick NEGATIVE NEGATIVE   Bilirubin Urine NEGATIVE NEGATIVE   Ketones, ur NEGATIVE NEGATIVE mg/dL   Protein, ur NEGATIVE NEGATIVE mg/dL   Nitrite NEGATIVE NEGATIVE   Leukocytes, UA TRACE (A) NEGATIVE   RBC / HPF 0-5 0 - 5 RBC/hpf   WBC, UA 21-50 0 - 5 WBC/hpf   Bacteria, UA RARE (A) NONE SEEN   Squamous Epithelial / LPF 0-5 0 - 5   Hyaline Casts, UA PRESENT     Comment: Performed at [facility], [address], [place], [place] [address]  Lactic acid, plasma     Status: Abnormal   Collection Time: 07/25/17  4:50 PM  Result Value Ref Range   Lactic Acid, Venous 2.4  (HH) 0.5 - 1.9 mmol/L    Comment: CRITICAL RESULT CALLED TO, READ BACK BY AND VERIFIED WITH: EASTWOOD,J ON 07/25/17 AT 1905 BY LOY,C Performed at [facility], [address], [place], [place] [address]   Blood culture (routine x 2)     Status: None (Preliminary result)   Collection Time: 07/25/17  4:50 PM  Result Value Ref Range   Specimen Description RIGHT ANTECUBITAL    Special Requests      BOTTLES DRAWN AEROBIC AND ANAEROBIC Blood Culture adequate volume   Culture      NO GROWTH 2 DAYS Performed at [facility], [address] [place], [place] [address]    Report Status PENDING   Blood culture (routine x 2)     Status: None (Preliminary result)   Collection Time: 07/25/17  4:52 PM  Result Value Ref Range   Specimen Description BLOOD RIGHT WRIST    Special Requests      BOTTLES DRAWN AEROBIC AND ANAEROBIC Blood Culture adequate volume   Culture      NO GROWTH 2 DAYS Performed at [facility], [address], [place], [place] [address]    Report Status PENDING   Lactic acid, plasma     Status: Abnormal   Collection Time: 07/25/17  6:34 PM  Result Value Ref Range   Lactic Acid, Venous 3.7 (HH) 0.5 - 1.9 mmol/L    Comment: CRITICAL RESULT CALLED TO, READ BACK BY AND VERIFIED WITH: LONG,H ON 07/25/17 AT 1950 BY LOY,C Performed at [facility], [address] [place], [place] [address]   CBG monitoring, ED     Status: Abnormal   Collection Time: 07/25/17 10:11 PM  Result Value Ref Range   Glucose-Capillary 33 (LL) 65 - 99 mg/dL   Comment 1 Notify RN    Comment 2 Document in Chart   CBG monitoring, ED     Status: Abnormal   Collection Time: 07/25/17 10:20 PM  Result Value Ref Range   Glucose-Capillary 373 (H) 65 - 99 mg/dL  MRSA PCR Screening     Status: Abnormal   Collection Time: 07/25/17 11:20 PM  Result Value Ref Range   MRSA by PCR POSITIVE (A)  NEGATIVE    Comment:        The GeneXpert MRSA Assay (FDA approved for NASAL specimens only), is one component of  a comprehensive MRSA colonization surveillance program. It is not intended to diagnose MRSA infection nor to guide or monitor treatment for MRSA infections. RESULT CALLED TO, READ BACK BY AND VERIFIED WITH: JACKSON N. AT 0818A ON [id number] BY THOMPSON S. Performed at [facility], [address], [place], [place] [address]   Glucose, capillary     Status: None   Collection Time: 07/26/17 12:05 AM  Result Value Ref Range   Glucose-Capillary 78 65 - 99 mg/dL  Glucose, capillary     Status: Abnormal   Collection Time: 07/26/17  5:05 AM  Result Value Ref Range   Glucose-Capillary 162 (H) 65 - 99 mg/dL  CBC     Status: Abnormal   Collection Time: 07/26/17  5:30 AM  Result Value Ref Range   WBC 6.1 4.0 - 10.5 K/uL   RBC 3.75 (L) 3.87 - 5.11 MIL/uL   Hemoglobin 10.1 (L) 12.0 - 15.0 g/dL   HCT 34.4 (L) 36.0 - 46.0 %   MCV 91.7 78.0 - 100.0 fL   MCH 26.9 26.0 - 34.0 pg   MCHC 29.4 (L) 30.0 - 36.0 g/dL   RDW 14.2 11.5 - 15.5 %   Platelets 107 (L) 150 - 400 K/uL    Comment: SPECIMEN CHECKED FOR CLOTS REPEATED TO VERIFY Performed at [facility], [address] [place], [place] [address]   Comprehensive metabolic panel     Status: Abnormal   Collection Time: 07/26/17  5:30 AM  Result Value Ref Range   Sodium 136 135 - 145 mmol/L   Potassium 4.4 3.5 - 5.1 mmol/L   Chloride 102 101 - 111 mmol/L   CO2 28 22 - 32 mmol/L   Glucose, Bld 162 (H) 65 - 99 mg/dL   BUN 33 (H) 6 - 20 mg/dL   Creatinine, Ser 1.62 (H) 0.44 - 1.00 mg/dL   Calcium 8.7 (L) 8.9 - 10.3 mg/dL   Total Protein 6.4 (L) 6.5 - 8.1 g/dL   Albumin 3.1 (L) 3.5 - 5.0 g/dL   AST 52 (H) 15 - 41 U/L   ALT 39 14 - 54 U/L   Alkaline Phosphatase 103 38 - 126 U/L   Total Bilirubin 0.8 0.3 - 1.2 mg/dL   GFR calc non Af Amer 29 (L) >60 mL/min   GFR calc Af Amer 33 (L) >60 mL/min    Comment: (NOTE) The eGFR has been calculated using the CKD EPI equation. This calculation has not been validated in all clinical situations. eGFR's  persistently <60 mL/min signify possible Chronic Kidney Disease.    Anion gap 6 5 - 15    Comment: Performed at [facility], [address], [place], [place] [address]  Glucose, capillary     Status: None   Collection Time: 07/26/17  7:33 AM  Result Value Ref Range   Glucose-Capillary 94 65 - 99 mg/dL   Comment 1 Notify RN    Comment 2 Document in Chart   Glucose, capillary     Status: Abnormal   Collection Time: 07/26/17 11:48 AM  Result Value Ref Range   Glucose-Capillary 161 (H) 65 - 99 mg/dL   Comment 1 Notify RN    Comment 2 Document in Chart   Glucose, capillary     Status: Abnormal   Collection Time: 07/26/17  4:48 PM  Result Value Ref Range  Glucose-Capillary 289 (H) 65 - 99 mg/dL   Comment 1 Notify RN    Comment 2 Document in Chart   Glucose, capillary     Status: Abnormal   Collection Time: 07/26/17  9:30 PM  Result Value Ref Range   Glucose-Capillary 206 (H) 65 - 99 mg/dL   Comment 1 Notify RN    Comment 2 Document in Chart   Glucose, capillary     Status: Abnormal   Collection Time: 07/27/17  7:25 AM  Result Value Ref Range   Glucose-Capillary 100 (H) 65 - 99 mg/dL   Comment 1 Notify RN    Comment 2 Document in Chart     ABGS No results for input(s): PHART, PO2ART, TCO2, HCO3 in the last 72 hours.  Invalid input(s): PCO2 CULTURES Recent Results (from the past 240 hour(s))  Blood culture (routine x 2)     Status: None (Preliminary result)   Collection Time: 07/25/17  4:50 PM  Result Value Ref Range Status   Specimen Description RIGHT ANTECUBITAL  Final   Special Requests   Final    BOTTLES DRAWN AEROBIC AND ANAEROBIC Blood Culture adequate volume   Culture   Final    NO GROWTH 2 DAYS Performed at [facility], [address], [place], [place] [address]    Report Status PENDING  Incomplete  Blood culture (routine x 2)     Status: None (Preliminary result)   Collection Time: 07/25/17  4:52 PM  Result Value Ref Range Status   Specimen Description BLOOD  RIGHT WRIST  Final   Special Requests   Final    BOTTLES DRAWN AEROBIC AND ANAEROBIC Blood Culture adequate volume   Culture   Final    NO GROWTH 2 DAYS Performed at [facility], [address] [place], [place] [address]    Report Status PENDING  Incomplete  MRSA PCR Screening     Status: Abnormal   Collection Time: 07/25/17 11:20 PM  Result Value Ref Range Status   MRSA by PCR POSITIVE (A) NEGATIVE Final    Comment:        The GeneXpert MRSA Assay (FDA approved for NASAL specimens only), is one component of a comprehensive MRSA colonization surveillance program. It is not intended to diagnose MRSA infection nor to guide or monitor treatment for MRSA infections. RESULT CALLED TO, READ BACK BY AND VERIFIED WITH: JACKSON N. AT 0818A ON [id number] BY THOMPSON S. Performed at [facility], [address], [place], [place] [address]    Studies/Results: Dg Chest 2 View  Result Date: 07/25/2017 CLINICAL DATA:  Nonproductive cough EXAM: CHEST - 2 VIEW COMPARISON:  04/28/2016 FINDINGS: Normal heart size. Lungs are under aerated with bibasilar atelectasis. No pneumothorax or pleural effusion. Lower thoracic compression deformity with kyphosis is unchanged. IMPRESSION: No active cardiopulmonary disease. Electronically Signed   By: Marybelle Killings M.D.   On: 07/25/2017 17:34    Medications:  Prior to Admission:  Medications Prior to Admission  Medication Sig Dispense Refill Last Dose  . ALPHAGAN P 0.1 % SOLN Place 1 drop into both eyes 2 (two) times daily.    07/25/2017 at Unknown time  . amLODipine (NORVASC) 5 MG tablet Take 5 mg by mouth every morning.    07/25/2017 at Unknown time  . anastrozole (ARIMIDEX) 1 MG tablet Take 1 mg by mouth daily.   07/25/2017 at Unknown time  . aspirin EC 81 MG tablet Take 81 mg by mouth daily.   07/25/2017 at Unknown time  .  atorvastatin (LIPITOR) 10 MG tablet Take 10 mg by mouth every evening.    07/24/2017 at Unknown time  . Bacillus Coagulans-Inulin (PROBIOTIC  FORMULA PO) Take 1 capsule by mouth daily.   07/25/2017 at Unknown time  . cyanocobalamin (,VITAMIN B-12,) 1000 MCG/ML injection Inject 1,000 mcg into the muscle every 30 (thirty) days.    Past Month at Unknown time  . dorzolamide (TRUSOPT) 2 % ophthalmic solution PLACE ONE DROP IN [facility] EYE TWICE DAILY  4 07/25/2017 at Unknown time  . EPIPEN 2-PAK 0.3 MG/0.3ML SOAJ injection Inject 0.3 mg into the muscle once.    UNKNOWN  . gabapentin (NEURONTIN) 600 MG tablet Take 600 mg by mouth at bedtime.    07/24/2017 at Unknown time  . glipiZIDE (GLUCOTROL) 5 MG tablet Take 5 mg by mouth daily before breakfast.    07/25/2017 at Unknown time  . hydrALAZINE (APRESOLINE) 25 MG tablet Take 25-50 mg by mouth See admin instructions. '50mg'$  in the morning and '25mg'$  at bedtime   07/25/2017 at Unknown time  . irbesartan (AVAPRO) 150 MG tablet Take 150 mg by mouth daily.   07/25/2017 at Unknown time  . latanoprost (XALATAN) 0.005 % ophthalmic solution Place 1 drop into both eyes at bedtime.    07/24/2017 at Unknown time  . metFORMIN (GLUCOPHAGE) 1000 MG tablet Take 1,000 mg by mouth daily with breakfast.    07/25/2017 at Unknown time  . metoprolol succinate (TOPROL-XL) 100 MG 24 hr tablet Take 100 mg by mouth every morning. Take with or immediately following a meal.   07/25/2017 at [place]  . mirtazapine (REMERON) 7.5 MG tablet Take 7.5 mg by mouth at bedtime.    07/24/2017 at Unknown time  . MYRBETRIQ 25 MG TB24 tablet Take 25 mg by mouth daily.    07/25/2017 at Unknown time  . omeprazole (PRILOSEC) 20 MG capsule Take 20 mg by mouth daily.   07/25/2017 at Unknown time  . Oxycodone HCl 10 MG TABS Take 10 mg by mouth 3 (three) times daily.    07/25/2017 at Unknown time  . torsemide (DEMADEX) 20 MG tablet Take 20 mg by mouth 2 (two) times daily.    07/25/2017 at Unknown time  . TRESIBA FLEXTOUCH 100 UNIT/ML SOPN Inject 70 Units into the skin every morning.    07/25/2017 at Unknown time   Scheduled: . amLODipine  5 mg Oral q morning - 10a   . anastrozole  1 mg Oral Daily  . aspirin EC  81 mg Oral Daily  . atorvastatin  10 mg Oral QPM  . brimonidine  1 drop Both Eyes BID  . Chlorhexidine Gluconate Cloth  6 each Topical Q0600  . dorzolamide  1 drop Both Eyes BID  . enoxaparin (LOVENOX) injection  30 mg Subcutaneous Q24H  . gabapentin  600 mg Oral QHS  . guaiFENesin  600 mg Oral BID  . hydrALAZINE  25 mg Oral QHS  . hydrALAZINE  50 mg Oral Daily  . insulin aspart  0-9 Units Subcutaneous TID WC  . irbesartan  150 mg Oral Daily  . latanoprost  1 drop Both Eyes QHS  . metoprolol succinate  100 mg Oral q morning - 10a  . mirabegron ER  25 mg Oral Daily  . mirtazapine  7.5 mg Oral QHS  . mupirocin ointment  1 application Nasal BID  . oxyCODONE  10 mg Oral TID  . pantoprazole  40 mg Oral Daily  . torsemide  20 mg Oral BID   Continuous: .  sodium chloride 10 mL/hr at 07/26/17 0400  . cefTRIAXone (ROCEPHIN)  IV 1 g (07/26/17 1748)   VHO:[id number] **OR** acetaminophen, ipratropium-albuterol, ondansetron **OR** ondansetron (ZOFRAN) IV  Assesment: She was admitted with metabolic encephalopathy.  She has a urinary tract infection.  She probably has some element of bronchitis.  She is better and ready for discharge home. Active Problems:   Encephalopathy    Plan: Discharge home today    LOS: 2 days   Coumba Kellison L 07/27/2017, 8:56 AM

## 2017-07-27 NOTE — Progress Notes (Signed)
Pt discharged home today per Dr. Hawkins. Pt's IV site D/C'd and WDL. Pt's VSS. Pt provided with home medication list, discharge instructions and prescriptions. Verbalized understanding. Pt left floor via WC in stable condition accompanied by NT. 

## 2017-07-27 NOTE — Discharge Summary (Signed)
Physician Discharge Summary  Patient ID: [name] MRN: [id number] DOB/AGE: 1933-03-08 84 y.o. Primary Care Physician:Loranzo Desha, Percell Miller, MD Admit date: 07/25/2017 Discharge date: 07/27/2017    Discharge Diagnoses:   Active Problems:   Hyperlipidemia   Hypertension   DM type 2, uncontrolled, with neuropathy (HCC)   Generalized weakness   Lumbago   Encephalopathy UTI Metabolic encephalopathy Acute bronchitis  Allergies as of 07/27/2017      Reactions   Bee Venom Anaphylaxis   Hydromorphone Hcl    Other reaction(s): Agitation, Confusion   Demerol Nausea Only   Other Other (See Comments)   Bee stings-swelling      Medication List    TAKE these medications   ALPHAGAN P 0.1 % Soln Generic drug:  brimonidine Place 1 drop into both eyes 2 (two) times daily.   amLODipine 5 MG tablet Commonly known as:  NORVASC Take 5 mg by mouth every morning.   anastrozole 1 MG tablet Commonly known as:  ARIMIDEX Take 1 mg by mouth daily.   aspirin EC 81 MG tablet Take 81 mg by mouth daily.   atorvastatin 10 MG tablet Commonly known as:  LIPITOR Take 10 mg by mouth every evening.   cefdinir 300 MG capsule Commonly known as:  OMNICEF Take 1 capsule (300 mg total) by mouth 2 (two) times daily.   cyanocobalamin 1000 MCG/ML injection Commonly known as:  (VITAMIN B-12) Inject 1,000 mcg into the muscle every 30 (thirty) days.   dorzolamide 2 % ophthalmic solution Commonly known as:  TRUSOPT PLACE ONE DROP IN EACH EYE TWICE DAILY   EPIPEN 2-PAK 0.3 mg/0.3 mL Soaj injection Generic drug:  EPINEPHrine Inject 0.3 mg into the muscle once.   gabapentin 600 MG tablet Commonly known as:  NEURONTIN Take 600 mg by mouth at bedtime.   glipiZIDE 5 MG tablet Commonly known as:  GLUCOTROL Take 5 mg by mouth daily before breakfast.   hydrALAZINE 25 MG tablet Commonly known as:  APRESOLINE Take 25-50 mg by mouth See admin instructions. 50mg  in the morning and 25mg  at bedtime    irbesartan 150 MG tablet Commonly known as:  AVAPRO Take 150 mg by mouth daily.   latanoprost 0.005 % ophthalmic solution Commonly known as:  XALATAN Place 1 drop into both eyes at bedtime.   metFORMIN 1000 MG tablet Commonly known as:  GLUCOPHAGE Take 1,000 mg by mouth daily with breakfast.   metoprolol succinate 100 MG 24 hr tablet Commonly known as:  TOPROL-XL Take 100 mg by mouth every morning. Take with or immediately following a meal.   mirtazapine 7.5 MG tablet Commonly known as:  REMERON Take 7.5 mg by mouth at bedtime.   MYRBETRIQ 25 MG Tb24 tablet Generic drug:  mirabegron ER Take 25 mg by mouth daily.   omeprazole 20 MG capsule Commonly known as:  PRILOSEC Take 20 mg by mouth daily.   Oxycodone HCl 10 MG Tabs Take 10 mg by mouth 3 (three) times daily.   PROBIOTIC FORMULA PO Take 1 capsule by mouth daily.   torsemide 20 MG tablet Commonly known as:  DEMADEX Take 20 mg by mouth 2 (two) times daily.   TRESIBA FLEXTOUCH 100 UNIT/ML Sopn FlexTouch Pen Generic drug:  insulin degludec Inject 70 Units into the skin every morning.       Discharged Condition: Improved    Consults: None  Significant Diagnostic Studies: Dg Chest 2 View  Result Date: 07/25/2017 CLINICAL DATA:  Nonproductive cough EXAM: CHEST - 2 VIEW COMPARISON:  04/28/2016 FINDINGS: Normal heart size. Lungs are under aerated with bibasilar atelectasis. No pneumothorax or pleural effusion. Lower thoracic compression deformity with kyphosis is unchanged. IMPRESSION: No active cardiopulmonary disease. Electronically Signed   By: Marybelle Killings M.D.   On: 07/25/2017 17:34   Dg Ankle Complete Right  Result Date: 07/05/2017 Please see detailed radiograph report in office note.   Lab Results: Basic Metabolic Panel: Recent Labs    07/25/17 1558 07/26/17 0530  NA 135 136  K 4.9 4.4  CL 97* 102  CO2 26 28  GLUCOSE 281* 162*  BUN 39* 33*  CREATININE 1.91* 1.62*  CALCIUM 9.4 8.7*   Liver  Function Tests: Recent Labs    07/25/17 1558 07/26/17 0530  AST 94* 52*  ALT 52 39  ALKPHOS 151* 103  BILITOT 1.0 0.8  PROT 8.2* 6.4*  ALBUMIN 4.0 3.1*     CBC: Recent Labs    07/25/17 1558 07/26/17 0530  WBC 8.9 6.1  HGB 11.4* 10.1*  HCT 38.1 34.4*  MCV 90.9 91.7  PLT 141* 107*    Recent Results (from the past 240 hour(s))  Blood culture (routine x 2)     Status: None (Preliminary result)   Collection Time: 07/25/17  4:50 PM  Result Value Ref Range Status   Specimen Description RIGHT ANTECUBITAL  Final   Special Requests   Final    BOTTLES DRAWN AEROBIC AND ANAEROBIC Blood Culture adequate volume   Culture   Final    NO GROWTH 2 DAYS Performed at [facility], [address], [place], [place] [address]    Report Status PENDING  Incomplete  Blood culture (routine x 2)     Status: None (Preliminary result)   Collection Time: 07/25/17  4:52 PM  Result Value Ref Range Status   Specimen Description BLOOD RIGHT WRIST  Final   Special Requests   Final    BOTTLES DRAWN AEROBIC AND ANAEROBIC Blood Culture adequate volume   Culture   Final    NO GROWTH 2 DAYS Performed at [facility], [address] [place], [place] [address]    Report Status PENDING  Incomplete  MRSA PCR Screening     Status: Abnormal   Collection Time: 07/25/17 11:20 PM  Result Value Ref Range Status   MRSA by PCR POSITIVE (A) NEGATIVE Final    Comment:        The GeneXpert MRSA Assay (FDA approved for NASAL specimens only), is one component of a comprehensive MRSA colonization surveillance program. It is not intended to diagnose MRSA infection nor to guide or monitor treatment for MRSA infections. RESULT CALLED TO, READ BACK BY AND VERIFIED WITH: JACKSON N. AT 0818A ON [id number] BY THOMPSON S. Performed at [facility], [address], [place], [place] [address]      Hospital Course: This is an 84 year old who came to the hospital because she was confused.  She was found to have evidence of  urinary tract infection and culture is still pending.  She was better fairly quickly was receiving IV antibiotics and was back to baseline the next morning.  I kept her in the hospital to make sure that she did not have trouble in the afternoon and she did not.  She is coughing and I think she has acute bronchitis.  She has multiple other medical problems which are stable now.  She is ready for discharge on the 31st on oral antibiotics  Discharge Exam: Blood pressure 132/62, pulse 68, temperature 98.3 F (  36.8 C), temperature source Oral, resp. rate 16, height 5' (1.524 m), weight 88.7 kg (195 lb 8.8 oz), SpO2 92 %. She is awake and alert.  She is not confused.  She has rhonchi on her chest exam.  Disposition: Home      Signed: Rikayla Demmon L   07/27/2017, 9:01 AM

## 2017-07-27 NOTE — Care Management Important Message (Signed)
Important Message  Patient Details  Name: [name] MRN: [id number] Date of Birth: 1934-01-09   Medicare Important Message Given:  Yes    Shelda Altes 07/27/2017, 12:28 PM

## 2017-07-28 LAB — URINE CULTURE: Culture: >=100000 — AB

## 2017-07-30 LAB — CULTURE, BLOOD (ROUTINE X 2)
Culture: NO GROWTH
Culture: NO GROWTH
SPECIAL REQUESTS: ADEQUATE
Special Requests: ADEQUATE

## 2017-08-16 DIAGNOSIS — C44319 Basal cell carcinoma of skin of other parts of face: Secondary | ICD-10-CM | POA: Diagnosis not present

## 2017-08-16 DIAGNOSIS — Z85828 Personal history of other malignant neoplasm of skin: Secondary | ICD-10-CM | POA: Diagnosis not present

## 2017-08-17 ENCOUNTER — Emergency Department (HOSPITAL_COMMUNITY): Payer: Medicare HMO

## 2017-08-17 ENCOUNTER — Other Ambulatory Visit: Payer: Self-pay

## 2017-08-17 ENCOUNTER — Encounter (HOSPITAL_COMMUNITY): Payer: Self-pay | Admitting: Emergency Medicine

## 2017-08-17 ENCOUNTER — Inpatient Hospital Stay (HOSPITAL_COMMUNITY)
Admission: EM | Admit: 2017-08-17 | Discharge: 2017-08-21 | DRG: 682 | Disposition: A | Discharge status: Home Health | Payer: Medicare HMO | Attending: Pulmonary Disease | Admitting: Pulmonary Disease

## 2017-08-17 DIAGNOSIS — G9340 Encephalopathy, unspecified: Secondary | ICD-10-CM | POA: Diagnosis not present

## 2017-08-17 DIAGNOSIS — I10 Essential (primary) hypertension: Secondary | ICD-10-CM | POA: Diagnosis present

## 2017-08-17 DIAGNOSIS — M8580 Other specified disorders of bone density and structure, unspecified site: Secondary | ICD-10-CM | POA: Diagnosis present

## 2017-08-17 DIAGNOSIS — D519 Vitamin B12 deficiency anemia, unspecified: Secondary | ICD-10-CM | POA: Diagnosis present

## 2017-08-17 DIAGNOSIS — E785 Hyperlipidemia, unspecified: Secondary | ICD-10-CM | POA: Diagnosis present

## 2017-08-17 DIAGNOSIS — Z96651 Presence of right artificial knee joint: Secondary | ICD-10-CM | POA: Diagnosis present

## 2017-08-17 DIAGNOSIS — R4182 Altered mental status, unspecified: Secondary | ICD-10-CM | POA: Diagnosis not present

## 2017-08-17 DIAGNOSIS — Z9842 Cataract extraction status, left eye: Secondary | ICD-10-CM | POA: Diagnosis not present

## 2017-08-17 DIAGNOSIS — Z90710 Acquired absence of both cervix and uterus: Secondary | ICD-10-CM | POA: Diagnosis not present

## 2017-08-17 DIAGNOSIS — N179 Acute kidney failure, unspecified: Secondary | ICD-10-CM | POA: Diagnosis not present

## 2017-08-17 DIAGNOSIS — E1122 Type 2 diabetes mellitus with diabetic chronic kidney disease: Secondary | ICD-10-CM | POA: Diagnosis present

## 2017-08-17 DIAGNOSIS — Z9841 Cataract extraction status, right eye: Secondary | ICD-10-CM

## 2017-08-17 DIAGNOSIS — Z9049 Acquired absence of other specified parts of digestive tract: Secondary | ICD-10-CM

## 2017-08-17 DIAGNOSIS — D631 Anemia in chronic kidney disease: Secondary | ICD-10-CM | POA: Diagnosis present

## 2017-08-17 DIAGNOSIS — A419 Sepsis, unspecified organism: Secondary | ICD-10-CM

## 2017-08-17 DIAGNOSIS — R6520 Severe sepsis without septic shock: Secondary | ICD-10-CM | POA: Diagnosis not present

## 2017-08-17 DIAGNOSIS — E119 Type 2 diabetes mellitus without complications: Secondary | ICD-10-CM

## 2017-08-17 DIAGNOSIS — H269 Unspecified cataract: Secondary | ICD-10-CM | POA: Diagnosis present

## 2017-08-17 DIAGNOSIS — I5032 Chronic diastolic (congestive) heart failure: Secondary | ICD-10-CM | POA: Diagnosis not present

## 2017-08-17 DIAGNOSIS — R531 Weakness: Secondary | ICD-10-CM | POA: Diagnosis not present

## 2017-08-17 DIAGNOSIS — Z85820 Personal history of malignant melanoma of skin: Secondary | ICD-10-CM | POA: Diagnosis not present

## 2017-08-17 DIAGNOSIS — Z7982 Long term (current) use of aspirin: Secondary | ICD-10-CM

## 2017-08-17 DIAGNOSIS — I130 Hypertensive heart and chronic kidney disease with heart failure and stage 1 through stage 4 chronic kidney disease, or unspecified chronic kidney disease: Secondary | ICD-10-CM | POA: Diagnosis present

## 2017-08-17 DIAGNOSIS — Z853 Personal history of malignant neoplasm of breast: Secondary | ICD-10-CM | POA: Diagnosis not present

## 2017-08-17 DIAGNOSIS — G9341 Metabolic encephalopathy: Secondary | ICD-10-CM | POA: Diagnosis not present

## 2017-08-17 DIAGNOSIS — G8929 Other chronic pain: Secondary | ICD-10-CM | POA: Diagnosis present

## 2017-08-17 DIAGNOSIS — Z66 Do not resuscitate: Secondary | ICD-10-CM | POA: Diagnosis present

## 2017-08-17 DIAGNOSIS — K219 Gastro-esophageal reflux disease without esophagitis: Secondary | ICD-10-CM | POA: Diagnosis present

## 2017-08-17 DIAGNOSIS — N184 Chronic kidney disease, stage 4 (severe): Secondary | ICD-10-CM | POA: Diagnosis present

## 2017-08-17 DIAGNOSIS — M545 Low back pain: Secondary | ICD-10-CM | POA: Diagnosis present

## 2017-08-17 DIAGNOSIS — M1990 Unspecified osteoarthritis, unspecified site: Secondary | ICD-10-CM | POA: Diagnosis present

## 2017-08-17 DIAGNOSIS — Z87442 Personal history of urinary calculi: Secondary | ICD-10-CM | POA: Diagnosis not present

## 2017-08-17 DIAGNOSIS — Z8249 Family history of ischemic heart disease and other diseases of the circulatory system: Secondary | ICD-10-CM

## 2017-08-17 DIAGNOSIS — Z91030 Bee allergy status: Secondary | ICD-10-CM

## 2017-08-17 DIAGNOSIS — E860 Dehydration: Secondary | ICD-10-CM | POA: Diagnosis present

## 2017-08-17 DIAGNOSIS — Z7984 Long term (current) use of oral hypoglycemic drugs: Secondary | ICD-10-CM | POA: Diagnosis not present

## 2017-08-17 DIAGNOSIS — Z885 Allergy status to narcotic agent status: Secondary | ICD-10-CM

## 2017-08-17 DIAGNOSIS — N189 Chronic kidney disease, unspecified: Secondary | ICD-10-CM | POA: Diagnosis present

## 2017-08-17 DIAGNOSIS — E1165 Type 2 diabetes mellitus with hyperglycemia: Secondary | ICD-10-CM | POA: Diagnosis present

## 2017-08-17 LAB — CBC WITH DIFFERENTIAL/PLATELET
BASOS ABS: 0 10*3/uL (ref 0.0–0.1)
BASOS PCT: 0 %
EOS ABS: 0.4 10*3/uL (ref 0.0–0.7)
Eosinophils Relative: 5 %
HEMATOCRIT: 35 % — AB (ref 36.0–46.0)
Hemoglobin: 10.3 g/dL — ABNORMAL LOW (ref 12.0–15.0)
Lymphocytes Relative: 22 %
Lymphs Abs: 1.6 10*3/uL (ref 0.7–4.0)
MCH: 27.7 pg (ref 26.0–34.0)
MCHC: 29.4 g/dL — ABNORMAL LOW (ref 30.0–36.0)
MCV: 94.1 fL (ref 78.0–100.0)
Monocytes Absolute: 0.5 10*3/uL (ref 0.1–1.0)
Monocytes Relative: 7 %
NEUTROS ABS: 4.8 10*3/uL (ref 1.7–7.7)
Neutrophils Relative %: 66 %
Platelets: 155 10*3/uL (ref 150–400)
RBC: 3.72 MIL/uL — AB (ref 3.87–5.11)
RDW: 14.7 % (ref 11.5–15.5)
WBC: 7.3 10*3/uL (ref 4.0–10.5)

## 2017-08-17 LAB — PROTIME-INR
INR: 1.09
PROTHROMBIN TIME: 14 s (ref 11.4–15.2)

## 2017-08-17 LAB — URINALYSIS, ROUTINE W REFLEX MICROSCOPIC
BILIRUBIN URINE: NEGATIVE
Bacteria, UA: NONE SEEN
GLUCOSE, UA: NEGATIVE mg/dL
Hgb urine dipstick: NEGATIVE
Ketones, ur: NEGATIVE mg/dL
Nitrite: NEGATIVE
PH: 5 (ref 5.0–8.0)
PROTEIN: NEGATIVE mg/dL
Specific Gravity, Urine: 1.013 (ref 1.005–1.030)

## 2017-08-17 LAB — COMPREHENSIVE METABOLIC PANEL
ALBUMIN: 3.6 g/dL (ref 3.5–5.0)
ALK PHOS: 121 U/L (ref 38–126)
ALT: 21 U/L (ref 14–54)
ANION GAP: 10 (ref 5–15)
AST: 34 U/L (ref 15–41)
BILIRUBIN TOTAL: 0.6 mg/dL (ref 0.3–1.2)
BUN: 43 mg/dL — ABNORMAL HIGH (ref 6–20)
CALCIUM: 8.7 mg/dL — AB (ref 8.9–10.3)
CO2: 27 mmol/L (ref 22–32)
Chloride: 101 mmol/L (ref 101–111)
Creatinine, Ser: 2.56 mg/dL — ABNORMAL HIGH (ref 0.44–1.00)
GFR calc non Af Amer: 16 mL/min — ABNORMAL LOW (ref >60)
GFR, EST AFRICAN AMERICAN: 19 mL/min — AB (ref >60)
GLUCOSE: 242 mg/dL — AB (ref 65–99)
POTASSIUM: 4.8 mmol/L (ref 3.5–5.1)
SODIUM: 138 mmol/L (ref 135–145)
TOTAL PROTEIN: 7.3 g/dL (ref 6.5–8.1)

## 2017-08-17 LAB — LACTIC ACID, PLASMA: Lactic Acid, Venous: 2 mmol/L (ref 0.5–1.9)

## 2017-08-17 LAB — LIPASE, BLOOD: Lipase: 26 U/L (ref 11–51)

## 2017-08-17 LAB — BRAIN NATRIURETIC PEPTIDE: B NATRIURETIC PEPTIDE 5: 49 pg/mL (ref 0.0–100.0)

## 2017-08-17 LAB — I-STAT CG4 LACTIC ACID, ED: LACTIC ACID, VENOUS: 1.49 mmol/L (ref 0.5–1.9)

## 2017-08-17 LAB — TROPONIN I

## 2017-08-17 MED ORDER — AMLODIPINE BESYLATE 5 MG PO TABS
5.0000 mg | ORAL_TABLET | Freq: Every morning | ORAL | Status: DC
  Administered 2017-08-18 – 2017-08-21 (×4): 5 mg via ORAL
  Filled 2017-08-17 (×4): qty 1

## 2017-08-17 MED ORDER — VANCOMYCIN HCL IN DEXTROSE 1-5 GM/200ML-% IV SOLN: 1000.0000 mg | Freq: Once | INTRAVENOUS | Status: DC

## 2017-08-17 MED ORDER — GABAPENTIN 300 MG PO CAPS
600.0000 mg | ORAL_CAPSULE | Freq: Every day | ORAL | Status: DC
  Administered 2017-08-18 – 2017-08-20 (×3): 600 mg via ORAL
  Filled 2017-08-17 (×3): qty 2

## 2017-08-17 MED ORDER — OXYCODONE HCL 5 MG PO TABS
10.0000 mg | ORAL_TABLET | Freq: Three times a day (TID) | ORAL | Status: DC
  Administered 2017-08-18 – 2017-08-21 (×10): 10 mg via ORAL
  Filled 2017-08-17 (×10): qty 2

## 2017-08-17 MED ORDER — DORZOLAMIDE HCL 2 % OP SOLN
1.0000 [drp] | Freq: Two times a day (BID) | OPHTHALMIC | Status: DC
  Administered 2017-08-18 – 2017-08-21 (×7): 1 [drp] via OPHTHALMIC
  Filled 2017-08-17 (×2): qty 10

## 2017-08-17 MED ORDER — ONDANSETRON HCL 4 MG/2ML IJ SOLN: 4.0000 mg | Freq: Four times a day (QID) | INTRAMUSCULAR | Status: DC | PRN

## 2017-08-17 MED ORDER — VANCOMYCIN HCL 10 G IV SOLR
2000.0000 mg | Freq: Once | INTRAVENOUS | Status: AC
  Administered 2017-08-18: 2000 mg via INTRAVENOUS
  Filled 2017-08-17 (×2): qty 2000

## 2017-08-17 MED ORDER — PANTOPRAZOLE SODIUM 40 MG PO TBEC
40.0000 mg | DELAYED_RELEASE_TABLET | Freq: Every day | ORAL | Status: DC
  Administered 2017-08-18 – 2017-08-21 (×4): 40 mg via ORAL
  Filled 2017-08-17 (×4): qty 1

## 2017-08-17 MED ORDER — PIPERACILLIN-TAZOBACTAM 3.375 G IVPB 30 MIN
3.3750 g | Freq: Once | INTRAVENOUS | Status: AC
  Administered 2017-08-17: 3.375 g via INTRAVENOUS
  Filled 2017-08-17: qty 50

## 2017-08-17 MED ORDER — SODIUM CHLORIDE 0.9 % IV SOLN
INTRAVENOUS | Status: DC
  Administered 2017-08-18 – 2017-08-20 (×3): via INTRAVENOUS

## 2017-08-17 MED ORDER — INSULIN ASPART 100 UNIT/ML ~~LOC~~ SOLN
0.0000 [IU] | Freq: Three times a day (TID) | SUBCUTANEOUS | Status: DC
  Administered 2017-08-18 (×3): 3 [IU] via SUBCUTANEOUS
  Administered 2017-08-19: 8 [IU] via SUBCUTANEOUS
  Administered 2017-08-19: 5 [IU] via SUBCUTANEOUS
  Administered 2017-08-20: 3 [IU] via SUBCUTANEOUS
  Administered 2017-08-20: 8 [IU] via SUBCUTANEOUS
  Administered 2017-08-20: 3 [IU] via SUBCUTANEOUS

## 2017-08-17 MED ORDER — ANASTROZOLE 1 MG PO TABS
1.0000 mg | ORAL_TABLET | Freq: Every day | ORAL | Status: DC
  Administered 2017-08-18 – 2017-08-21 (×4): 1 mg via ORAL
  Filled 2017-08-17 (×5): qty 1

## 2017-08-17 MED ORDER — LATANOPROST 0.005 % OP SOLN
1.0000 [drp] | Freq: Every day | OPHTHALMIC | Status: DC
  Administered 2017-08-18 – 2017-08-20 (×3): 1 [drp] via OPHTHALMIC
  Filled 2017-08-17: qty 2.5

## 2017-08-17 MED ORDER — INSULIN DEGLUDEC 100 UNIT/ML ~~LOC~~ SOPN
35.0000 [IU] | PEN_INJECTOR | Freq: Every morning | SUBCUTANEOUS | Status: DC
  Filled 2017-08-17: qty 3

## 2017-08-17 MED ORDER — RISAQUAD PO CAPS
ORAL_CAPSULE | Freq: Every day | ORAL | Status: DC
  Administered 2017-08-18 – 2017-08-21 (×4): 1 via ORAL
  Filled 2017-08-17 (×4): qty 1

## 2017-08-17 MED ORDER — MIRTAZAPINE 15 MG PO TABS
7.5000 mg | ORAL_TABLET | Freq: Every day | ORAL | Status: DC
  Administered 2017-08-18 – 2017-08-20 (×3): 7.5 mg via ORAL
  Filled 2017-08-17 (×3): qty 1

## 2017-08-17 MED ORDER — INSULIN DEGLUDEC 100 UNIT/ML ~~LOC~~ SOPN: 70.0000 [IU] | PEN_INJECTOR | Freq: Every morning | SUBCUTANEOUS | Status: DC

## 2017-08-17 MED ORDER — HEPARIN SODIUM (PORCINE) 5000 UNIT/ML IJ SOLN
5000.0000 [IU] | Freq: Three times a day (TID) | INTRAMUSCULAR | Status: DC
  Administered 2017-08-18 – 2017-08-21 (×10): 5000 [IU] via SUBCUTANEOUS
  Filled 2017-08-17 (×10): qty 1

## 2017-08-17 MED ORDER — INSULIN ASPART 100 UNIT/ML ~~LOC~~ SOLN
0.0000 [IU] | Freq: Every day | SUBCUTANEOUS | Status: DC
  Administered 2017-08-18: 2 [IU] via SUBCUTANEOUS
  Administered 2017-08-19: 3 [IU] via SUBCUTANEOUS

## 2017-08-17 MED ORDER — ACETAMINOPHEN 650 MG RE SUPP: 650.0000 mg | Freq: Four times a day (QID) | RECTAL | Status: DC | PRN

## 2017-08-17 MED ORDER — METOPROLOL SUCCINATE ER 50 MG PO TB24
100.0000 mg | ORAL_TABLET | Freq: Every morning | ORAL | Status: DC
Start: 2017-08-18 — End: 2017-08-18
  Filled 2017-08-17 (×3): qty 1

## 2017-08-17 MED ORDER — ACETAMINOPHEN 325 MG PO TABS
650.0000 mg | ORAL_TABLET | Freq: Four times a day (QID) | ORAL | Status: DC | PRN
  Administered 2017-08-18: 650 mg via ORAL
  Filled 2017-08-17: qty 2

## 2017-08-17 MED ORDER — BRIMONIDINE TARTRATE 0.15 % OP SOLN
1.0000 [drp] | Freq: Two times a day (BID) | OPHTHALMIC | Status: DC
  Administered 2017-08-18 – 2017-08-21 (×7): 1 [drp] via OPHTHALMIC
  Filled 2017-08-17: qty 5

## 2017-08-17 MED ORDER — HYDRALAZINE HCL 25 MG PO TABS: 25.0000 mg | ORAL_TABLET | ORAL | Status: DC

## 2017-08-17 MED ORDER — ASPIRIN EC 81 MG PO TBEC
81.0000 mg | DELAYED_RELEASE_TABLET | Freq: Every day | ORAL | Status: DC
  Administered 2017-08-18 – 2017-08-21 (×4): 81 mg via ORAL
  Filled 2017-08-17 (×4): qty 1

## 2017-08-17 MED ORDER — MIRABEGRON ER 25 MG PO TB24
25.0000 mg | ORAL_TABLET | Freq: Every day | ORAL | Status: DC
  Administered 2017-08-18 – 2017-08-21 (×4): 25 mg via ORAL
  Filled 2017-08-17 (×4): qty 1

## 2017-08-17 MED ORDER — ONDANSETRON HCL 4 MG PO TABS: 4.0000 mg | ORAL_TABLET | Freq: Four times a day (QID) | ORAL | Status: DC | PRN

## 2017-08-17 MED ORDER — ATORVASTATIN CALCIUM 10 MG PO TABS
10.0000 mg | ORAL_TABLET | Freq: Every evening | ORAL | Status: DC
  Administered 2017-08-18 – 2017-08-20 (×3): 10 mg via ORAL
  Filled 2017-08-17 (×3): qty 1

## 2017-08-17 NOTE — ED Provider Notes (Signed)
[facility] EMERGENCY DEPARTMENT Provider Note   CSN: [id number] Arrival date & time: 08/17/17  1834     History   Chief Complaint Chief Complaint  Patient presents with  . Altered Mental Status    HPI RHILYN BATTLE is a 84 y.o. female.  Patient brought in by family members.  For her not being is mentally alert as usual.  No significant mental depression.  Also for having chills they were concerned she may have an infection.  Patient was admitted in May for infection.  Patient not febrile here.  But did seem to have chills.  Patient right from the beginning was alert and would follow commands a little confused about some dates but knew she was at [facility].     Past Medical History:  Diagnosis Date  . Abrasion of arm, left    secondary to fall   . Anemia   . Arthritis   . Breast cancer, left ([place])    er/pr +  . Cataract    no surgery as yet,starting of 3 years  . Chronic kidney disease    renal stones, one episode of Lithotripsy  . Cirrhosis (HCC)    Metavir score 4  . Complication of anesthesia   . Diabetes mellitus   . GERD (gastroesophageal reflux disease)   . Hyperlipidemia   . Hypertension    followed by [facility]. relative to  urology study grp.  Doesn't report ever having a stress test   . Mental disorder   . Multiple falls   . Nocturia   . PONV (postoperative nausea and vomiting)   . Seasonal allergies   . Skin cancer    face- melanoma, 2012- surg. excision      Patient Active Problem List   Diagnosis Date Noted  . Encephalopathy 07/25/2017  . Diabetes mellitus ([place]) 09/02/2015  . Anemia due to multiple mechanisms 02/17/2015  . Chronic LBP 02/17/2015  . Impaired renal function 02/17/2015  . Abducens nerve weakness 02/17/2015  . S/P knee replacement 12/18/2014  . Osteoarthritis of right knee 12/18/2014  . IDA (iron deficiency anemia) 10/29/2013  . Vitamin B12 deficiency anemia 09/09/2013  . Anemia, iron deficiency 09/09/2013  . Lumbago  08/06/2013  . Difficulty in walking(719.7) 08/06/2013  . Osteopenia 03/11/2013  . Malignant neoplasm of lower-inner quadrant of female breast ([place]) 03/10/2013  . Bilateral leg weakness 09/23/2012  . Acute renal failure ([place]) 07/25/2012  . Dehydration 07/25/2012  . DM type 2, uncontrolled, with neuropathy ([place]) 07/25/2012  . DM (diabetes mellitus), type 2, uncontrolled ([place]) 07/25/2012  . Essential hypertension, benign 07/25/2012  . Generalized weakness 07/25/2012  . Hyperlipidemia   . Chronic kidney disease   . Skin cancer   . Hypertension   . GERD (gastroesophageal reflux disease)   . Breast cancer ([place]) 02/19/2012  . CAP (community acquired pneumonia) 04/19/2011  . Nausea and vomiting 04/19/2011  . HTN (hypertension) 04/19/2011  . DM type 2 (diabetes mellitus, type 2) ([place]) 04/19/2011  . Arthritis 04/19/2011    Past Surgical History:  Procedure Laterality Date  . ABDOMINAL HYSTERECTOMY    . BACK SURGERY     x4 back surgery to include rods & screws  . Last in May 2014  . BREAST LUMPECTOMY WITH NEEDLE LOCALIZATION AND AXILLARY SENTINEL LYMPH NODE BX  03/12/2012   Procedure: BREAST LUMPECTOMY WITH NEEDLE LOCALIZATION AND AXILLARY SENTINEL LYMPH NODE BX;  Surgeon: Merrie Roof, MD;  Location: [place];  Service: General;  Laterality: Left;  .  BREAST SURGERY     lumpectomy x2/left   . CHOLECYSTECTOMY    . COLONOSCOPY N/A 11/20/2013   Dr.Rourk- diverticula was found in the left colon. o/w normal rectal, colonic and terminal ileal mucosa  . ESOPHAGOGASTRODUODENOSCOPY N/A 11/20/2013   Dr.Rourk- abnormal mucosa was found. diffuse snake skinning and friability of the gastric mucosa. patent pylorus. abnormal-appearing first, second and third portion of the duodenum. 1m gastric nodule at the GE junction. stomach bx= minimal chronic infammation, GE junction bx= polypoid fragments of gastric cardia type mucosa w/mod chronic inflammation and foveolar hyperplasia  . EYE SURGERY     cataract  surgery bilat   . FRACTURE SURGERY     1975- [facility]IF- L ankle   . GIVENS CAPSULE STUDY N/A 02/24/2014   Procedure: GIVENS CAPSULE STUDY;  Surgeon: RDaneil Dolin MD;  [facility];  Service: Endoscopy;  Laterality: N/A;  . RE-EXCISION OF BREAST LUMPECTOMY  03/22/2012   Procedure: RE-EXCISION OF BREAST LUMPECTOMY;  Surgeon: PMerrie Roof MD;  [place]  Service: General;  Laterality: Left;  re-excision left breast lumpectomy cavity  ER+, PR+  . ROTATOR CUFF REPAIR Bilateral   . SHOULDER SURGERY    . SKIN BIOPSY    . TOTAL KNEE ARTHROPLASTY Right 12/18/2014   Procedure: RIGHT TOTAL KNEE ARTHROPLASTY;  Surgeon: RSydnee Cabal MD;  Location: [facility];  Service: Orthopedics;  Laterality: Right;  . TUBAL LIGATION       OB History    Gravida  2   Para  2   Term  2   Preterm      AB      Living  2     SAB      TAB      Ectopic      Multiple      Live Births               Home Medications    Prior to Admission medications   Medication Sig Start Date End Date Taking? Authorizing Provider  ALPHAGAN P 0.1 % SOLN Place 1 drop into both eyes 2 (two) times daily.  08/01/16   [provider]  amLODipine (NORVASC) 5 MG tablet Take 5 mg by mouth every morning.     [provider]  anastrozole (ARIMIDEX) 1 MG tablet Take 1 mg by mouth daily.    [provider]  aspirin EC 81 MG tablet Take 81 mg by mouth daily.    [provider]  atorvastatin (LIPITOR) 10 MG tablet Take 10 mg by mouth every evening.     [provider]  Bacillus Coagulans-Inulin (PROBIOTIC FORMULA PO) Take 1 capsule by mouth daily.    [provider]  cefdinir (OMNICEF) 300 MG capsule Take 1 capsule (300 mg total) by mouth 2 (two) times daily. 07/27/17   HSinda Du MD  cyanocobalamin (,VITAMIN B-12,) 1000 MCG/ML injection Inject 1,000 mcg into the muscle every 30 (thirty) days.  05/14/17   [provider]    dorzolamide (TRUSOPT) 2 % ophthalmic solution [facility]ACE [facility] 06/12/17   [provider]  EPIPEN 2-PAK 0.3 MG/0.3ML SOAJ injection Inject 0.3 mg into the muscle once.  07/01/15   [provider]  gabapentin (NEURONTIN) 600 MG tablet Take 600 mg by mouth at bedtime.  05/14/17   [provider]  glipiZIDE (GLUCOTROL) 5 MG tablet Take 5 mg by mouth daily before breakfast.  07/31/16  [provider]  hydrALAZINE (APRESOLINE) 25 MG tablet Take 25-50 mg by mouth See admin instructions. 68m in the morning and 274mat bedtime    [provider]  irbesartan (AVAPRO) 150 MG tablet Take 150 mg by mouth daily.    [provider]  latanoprost (XALATAN) 0.005 % ophthalmic solution Place 1 drop into both eyes at bedtime.  08/10/15   [provider]  metFORMIN (GLUCOPHAGE) 1000 MG tablet Take 1,000 mg by mouth daily with breakfast.  07/31/16   [provider]  metoprolol succinate (TOPROL-XL) 100 MG 24 hr tablet Take 100 mg by mouth every morning. Take with or immediately following a meal.    [provider]  mirtazapine (REMERON) 7.5 MG tablet Take 7.5 mg by mouth at bedtime.  08/04/15   [provider]  MYRBETRIQ 25 MG TB24 tablet Take 25 mg by mouth daily.  07/16/17   [provider]  omeprazole (PRILOSEC) 20 MG capsule Take 20 mg by mouth daily.    [provider]  Oxycodone HCl 10 MG TABS Take 10 mg by mouth 3 (three) times daily.  08/26/15   [provider]  torsemide (DEMADEX) 20 MG tablet Take 20 mg by mouth 2 (two) times daily.  06/15/15   [provider]  TRESIBA FLEXTOUCH 100 UNIT/ML SOPN Inject 70 Units into the skin every morning.  08/04/15   [provider]    Family History Family History  Problem Relation Age of Onset  . Heart disease Father   . Colon cancer Neg Hx     Social History Social History   Tobacco Use  . Smoking status: Never Smoker   . Smokeless tobacco: Never Used  Substance Use Topics  . Alcohol use: No  . Drug use: No     Allergies   Bee venom; Hydromorphone hcl; Demerol; and Other   Review of Systems Review of Systems  Constitutional: Positive for chills. Negative for fever.  HENT: Negative for congestion and sore throat.   Eyes: Negative for redness.  Respiratory: Negative for shortness of breath.   Cardiovascular: Negative for chest pain.  Gastrointestinal: Negative for abdominal pain, nausea and vomiting.  Genitourinary: Negative for dysuria.  Musculoskeletal: Negative for back pain.  Skin: Negative for rash.  Neurological: Negative for seizures, syncope, weakness and headaches.  Hematological: Does not bruise/bleed easily.  Psychiatric/Behavioral: Positive for confusion.     Physical Exam Updated Vital Signs BP 115/79   Pulse 82   Temp 99.7 F (37.6 C) (Oral)   Resp 20   SpO2 97%   Physical Exam  Constitutional: She appears well-developed and well-nourished. No distress.  HENT:  Head: Normocephalic and atraumatic.  Mucous membranes dry.  Patient with bandage on her forehead.  She had a basal cell carcinoma removed 2 days ago.  Eyes: Pupils are equal, round, and reactive to light. Conjunctivae and EOM are normal.  Neck: Neck supple.  Cardiovascular: Normal rate and regular rhythm.  Pulmonary/Chest: Effort normal and breath sounds normal.  Abdominal: Soft. Bowel sounds are normal. There is no tenderness.  Musculoskeletal: Normal range of motion. She exhibits no edema.  Neurological: She is alert. She displays normal reflexes. No cranial nerve deficit or sensory deficit. She exhibits normal muscle tone. Coordination normal.  Skin: Skin is warm. No rash noted.  Nursing note and vitals reviewed.    ED Treatments / Results  Labs (all labs ordered are listed, but only abnormal results are displayed) Labs Reviewed  URINE CULTURE  CULTURE, BLOOD (ROUTINE X 2)  CULTURE, BLOOD (ROUTINE X  2)  URINALYSIS, ROUTINE W REFLEX MICROSCOPIC  LACTIC ACID, PLASMA  COMPREHENSIVE METABOLIC PANEL  LIPASE, BLOOD  CBC WITH DIFFERENTIAL/PLATELET  TROPONIN I  BRAIN NATRIURETIC PEPTIDE  PROTIME-INR    EKG None   ED ECG REPORT   Date: 08/17/2017  Rate: 79  Rhythm: normal sinus rhythm  QRS Axis: right  Intervals: normal  ST/T Wave abnormalities: normal  Conduction Disutrbances:none  Narrative Interpretation:   Old EKG Reviewed: none available  I have personally reviewed the EKG tracing and agree with the computerized printout as noted.   Radiology No results found.  Procedures Procedures (including critical care time)  CRITICAL CARE Performed by: Tristian Sickinger Total critical care time: 30 minutes Critical care time was exclusive of separately billable procedures and treating other patients. Critical care was necessary to treat or prevent imminent or life-threatening deterioration. Critical care was time spent personally by me on the following activities: development of treatment plan with patient and/or surrogate as well as nursing, discussions with consultants, evaluation of patient's response to treatment, examination of patient, obtaining history from patient or surrogate, ordering and performing treatments and interventions, ordering and review of laboratory studies, ordering and review of radiographic studies, pulse oximetry and re-evaluation of patient's condition.   Medications Ordered in ED Medications  0.9 %  sodium chloride infusion (has no administration in time range)     Initial Impression / Assessment and Plan / ED Course  I have reviewed the triage vital signs and the nursing notes.  Pertinent labs & imaging results that were available during my care of the patient were reviewed by me and considered in my medical decision making (see chart for details).    Patient work-up here with finding of acute kidney injury.  With doubling of her creatinine  probably secondary to dehydration.  Some concerns may be for infection lactic acid elevated.  But no distinct finding of infection in chest x-ray or an urinalysis.  Blood cultures pending urine culture pending.  As stated patient seemed to improve here with IV fluids.  Was started on broad-spectrum antibiotics.  Will be admitted by the hospitalist team.  Patient mental status is actually pretty good not concerned about any acute neurologic injuries no significant mental depression.  Patient will follow commands she is talkative.  May be some confusion about specific dates and things.  Hospitalist will admit.  Patient never met distinct criteria for code sepsis.   Final Clinical Impressions(s) / ED Diagnoses   Final diagnoses:  None    ED Discharge Orders    None       Fredia Sorrow, MD 08/17/17 2249

## 2017-08-17 NOTE — ED Triage Notes (Signed)
Pt here for altered mental status, shaking all over and slight confusion with weakness. CBG was 280.

## 2017-08-17 NOTE — H&P (Addendum)
History and Physical    [name] MGQ:[id number] DOB: [birth year] DOA: 08/17/2017  PCP: Sinda Du, MD   Patient coming from: Home  Chief Complaint: Confusion, weakness, and rigors  HPI: [name] is a 84 y.o. female with medical history significant for CKD stage III-IV, hypertension, grade 1 chronic diastolic CHF, dyslipidemia, type 2 diabetes, dyslipidemia, and chronic low back pain who presented to the ED with some Reiger's prior to dinner today.  She was also noted to be altered per family members and could not exactly remember certain details.  She was also generally noted to be weak.  She was recently discharged after treatment for UTI.  Her blood glucose was noted to be in the 200mg /dL range. She has recently had a basal cell carcinoma removed from her forehead with no drainage or tenderness noted to the area.  She denies any fever, shortness of breath, chest pain, abdominal pain, nausea, vomiting, or diarrhea.   ED Course: Vital signs are stable and patient is afebrile.  Creatinine is noted to be 2.56 with baseline of 1.62 with what appears to be CKD stage IV.  BUN is also elevated at 43 and glucose is 242.  INR is noted to be 1.09 and lactic acid level is at 2.  Two-view chest x-ray with no acute findings.  Urine analysis with no findings that are significant for UTI.  Patient has had 2 sets of blood cultures drawn with vancomycin and Zosyn started empirically.  She denies any active symptomatology currently and appears to be alert and oriented.  Review of Systems: All others reviewed and otherwise negative.  Past Medical History:  Diagnosis Date  . Abrasion of arm, left    secondary to fall   . Anemia   . Arthritis   . Breast cancer, left ([place])    er/pr +  . Cataract    no surgery as yet,starting of 3 years  . Chronic kidney disease    renal stones, one episode of Lithotripsy  . Cirrhosis (H[facility])    Metavir score 4  . Complication of anesthesia   . Diabetes  mellitus   . GERD (gastroesophageal reflux disease)   . Hyperlipidemia   . Hypertension    followed by [facility] relative to  urology study grp.  Doesn't report ever having a stress test   . Mental disorder   . Multiple falls   . Nocturia   . PONV (postoperative nausea and vomiting)   . Seasonal allergies   . Skin cancer    face- melanoma, 2012- surg. excision      Past Surgical History:  Procedure Laterality Date  . ABDOMINAL HYSTERECTOMY    . BACK SURGERY     x4 back surgery to include rods & screws  . Last in May 2014  . BREAST LUMPECTOMY WITH NEEDLE LOCALIZATION AND AXILLARY SENTINEL LYMPH NODE BX  03/12/2012   Procedure: BREAST LUMPECTOMY WITH NEEDLE LOCALIZATION AND AXILLARY SENTINEL LYMPH NODE BX;  Surgeon: Merrie Roof, MD;  Location: [facility];  Service: General;  Laterality: Left;  . BREAST SURGERY     lumpectomy x2/left   . CHOLECYSTECTOMY    . COLONOSCOPY N/A 11/20/2013   Dr.Rourk- diverticula was found in the left colon. o/w normal rectal, colonic and terminal ileal mucosa  . ESOPHAGOGASTRODUODENOSCOPY N/A 11/20/2013   Dr.Rourk- abnormal mucosa was found. diffuse snake skinning and friability of the gastric mucosa. patent pylorus. abnormal-appearing first, second and third portion of the duodenum. 71mm gastric  nodule at the GE junction. stomach bx= minimal chronic infammation, GE junction bx= polypoid fragments of gastric cardia type mucosa w/mod chronic inflammation and foveolar hyperplasia  . EYE SURGERY     cataract surgery bilat   . FRACTURE SURGERY     1975- [facility]- L ankle   . GIVENS CAPSULE STUDY N/A 02/24/2014   Procedure: GIVENS CAPSULE STUDY;  Surgeon: Daneil Dolin, MD;  Location: [facility];  Service: Endoscopy;  Laterality: N/A;  . RE-EXCISION OF BREAST LUMPECTOMY  03/22/2012   Procedure: RE-EXCISION OF BREAST LUMPECTOMY;  Surgeon: Merrie Roof, MD;  Location: [facility];  Service: General;  Laterality: Left;  re-excision left breast  lumpectomy cavity  ER+, PR+  . ROTATOR CUFF REPAIR Bilateral   . SHOULDER SURGERY    . SKIN BIOPSY    . TOTAL KNEE ARTHROPLASTY Right 12/18/2014   Procedure: RIGHT TOTAL KNEE ARTHROPLASTY;  Surgeon: Sydnee Cabal, MD;  Location: [facility];  Service: Orthopedics;  Laterality: Right;  . TUBAL LIGATION       reports that she has never smoked. She has never used smokeless tobacco. She reports that she does not drink alcohol or use drugs.  Allergies  Allergen Reactions  . Bee Venom Anaphylaxis  . Hydromorphone Hcl     Other reaction(s): Agitation, Confusion  . Demerol Nausea Only  . Other Other (See Comments)    Bee stings-swelling    Family History  Problem Relation Age of Onset  . Heart disease Father   . Colon cancer Neg Hx     Prior to Admission medications   Medication Sig Start Date End Date Taking? Authorizing Provider  ALPHAGAN P 0.1 % SOLN Place 1 drop into both eyes 2 (two) times daily.  08/01/16   [provider]  amLODipine (NORVASC) 5 MG tablet Take 5 mg by mouth every morning.     [provider]  anastrozole (ARIMIDEX) 1 MG tablet Take 1 mg by mouth daily.    [provider]  aspirin EC 81 MG tablet Take 81 mg by mouth daily.    [provider]  atorvastatin (LIPITOR) 10 MG tablet Take 10 mg by mouth every evening.     [provider]  Bacillus Coagulans-Inulin (PROBIOTIC FORMULA PO) Take 1 capsule by mouth daily.    [provider]  cefdinir (OMNICEF) 300 MG capsule Take 1 capsule (300 mg total) by mouth 2 (two) times daily. 07/27/17   Sinda Du, MD  cyanocobalamin (,VITAMIN B-12,) 1000 MCG/ML injection Inject 1,000 mcg into the muscle every 30 (thirty) days.  05/14/17   [provider]  dorzolamide (TRUSOPT) 2 % ophthalmic solution PLACE ONE DROP IN [facility] EYE TWICE DAILY 06/12/17   [provider]  EPIPEN 2-PAK 0.3 MG/0.3ML SOAJ injection Inject 0.3 mg into the muscle once.  07/01/15   [provider]  gabapentin (NEURONTIN) 600 MG tablet Take 600 mg by mouth at bedtime.  05/14/17   [provider]  glipiZIDE (GLUCOTROL) 5 MG tablet Take 5 mg by mouth daily before breakfast.  07/31/16   [provider]  hydrALAZINE (APRESOLINE) 25 MG tablet Take 25-50 mg by mouth See admin instructions. 50mg  in the morning and 25mg  at bedtime    [provider]  irbesartan (AVAPRO) 150 MG tablet Take 150 mg by mouth daily.    [provider]  latanoprost (XALATAN) 0.005 % ophthalmic solution Place 1 drop into both eyes at bedtime.  08/10/15  [provider]  metFORMIN (GLUCOPHAGE) 1000 MG tablet Take 1,000 mg by mouth daily with breakfast.  07/31/16   [provider]  metoprolol succinate (TOPROL-XL) 100 MG 24 hr tablet Take 100 mg by mouth every morning. Take with or immediately following a meal.    [provider]  mirtazapine (REMERON) 7.5 MG tablet Take 7.5 mg by mouth at bedtime.  08/04/15   [provider]  MYRBETRIQ 25 MG TB24 tablet Take 25 mg by mouth daily.  07/16/17   [provider]  omeprazole (PRILOSEC) 20 MG capsule Take 20 mg by mouth daily.    [provider]  Oxycodone HCl 10 MG TABS Take 10 mg by mouth 3 (three) times daily.  08/26/15   [provider]  torsemide (DEMADEX) 20 MG tablet Take 20 mg by mouth 2 (two) times daily.  06/15/15   [provider]  TRESIBA FLEXTOUCH 100 UNIT/ML SOPN Inject 70 Units into the skin every morning.  08/04/15   [provider]    Physical Exam: Vitals:   08/17/17 1847 08/17/17 1938 08/17/17 2200  BP: 113/71 115/79   Pulse: 80 82   Resp:  20   Temp: 99.7 F (37.6 C)    TempSrc: Oral    SpO2: 90% 97%   Weight:   88.5 kg (195 lb)    Constitutional: NAD, calm, comfortable Vitals:   08/17/17 1847 08/17/17 1938 08/17/17 2200  BP: 113/71 115/79   Pulse: 80 82   Resp:  20   Temp: 99.7 F (37.6 C)    TempSrc: Oral    SpO2: 90% 97%    Weight:   88.5 kg (195 lb)   Eyes: lids and conjunctivae normal ENMT: Mucous membranes are moist.  Neck: normal, supple Respiratory: clear to auscultation bilaterally. Normal respiratory effort. No accessory muscle use.  On 2 L nasal cannula. Cardiovascular: Regular rate and rhythm, no murmurs. No extremity edema. Abdomen: no tenderness, no distention. Bowel sounds positive.  Musculoskeletal:  No joint deformity upper and lower extremities.   Skin: no rashes, lesions, ulcers.  Clean, dry, dressing on forehead. Psychiatric: Normal judgment and insight. Alert and oriented x 3. Normal mood.   Labs on Admission: I have personally reviewed following labs and imaging studies  CBC: Recent Labs  Lab 08/17/17 2015  WBC 7.3  NEUTROABS 4.8  HGB 10.3*  HCT 35.0*  MCV 94.1  PLT 144   Basic Metabolic Panel: Recent Labs  Lab 08/17/17 2015  NA 138  K 4.8  CL 101  CO2 27  GLUCOSE 242*  BUN 43*  CREATININE 2.56*  CALCIUM 8.7*   GFR: Estimated Creatinine Clearance: 17.1 mL/min (A) (by C-G formula based on SCr of 2.56 mg/dL (H)). Liver Function Tests: Recent Labs  Lab 08/17/17 2015  AST 34  ALT 21  ALKPHOS 121  BILITOT 0.6  PROT 7.3  ALBUMIN 3.6   Recent Labs  Lab 08/17/17 2015  LIPASE 26   No results for input(s): AMMONIA in the last 168 hours. Coagulation Profile: Recent Labs  Lab 08/17/17 2015  INR 1.09   Cardiac Enzymes: Recent Labs  Lab 08/17/17 2015  TROPONINI <0.03   BNP (last 3 results) No results for input(s): PROBNP in the last 8760 hours. HbA1C: No results for input(s): HGBA1C in the last 72 hours. CBG: No results for input(s): GLUCAP in the last 168 hours. Lipid Profile: No results for input(s): CHOL, HDL, LDLCALC, TRIG, CHOLHDL, LDLDIRECT in the last 72 hours. Thyroid Function  Tests: No results for input(s): TSH, T4TOTAL, FREET4, T3FREE, THYROIDAB in the last 72 hours. Anemia Panel: No results for input(s): VITAMINB12, FOLATE, FERRITIN, TIBC,  IRON, RETICCTPCT in the last 72 hours. Urine analysis:    Component Value Date/Time   COLORURINE YELLOW 08/17/2017 2105   APPEARANCEUR CLEAR 08/17/2017 2105   LABSPEC 1.013 08/17/2017 2105   PHURINE 5.0 08/17/2017 2105   GLUCOSEU NEGATIVE 08/17/2017 2105   HGBUR NEGATIVE 08/17/2017 2105   BILIRUBINUR NEGATIVE 08/17/2017 2105   KETONESUR NEGATIVE 08/17/2017 2105   PROTEINUR NEGATIVE 08/17/2017 2105   UROBILINOGEN 1.0 12/11/2014 1106   NITRITE NEGATIVE 08/17/2017 2105   LEUKOCYTESUR TRACE (A) 08/17/2017 2105    Radiological Exams on Admission: Dg Chest 2 View  Result Date: 08/17/2017 CLINICAL DATA:  altered mental status, shaking all over and slight confusion with weakness, pt stated onset today. History of HTN, DM, CKD, cirrhosis, skin and breast cancer, lumpectomy. EXAM: CHEST - 2 VIEW COMPARISON:  07/25/2017 FINDINGS: Cardiac silhouette is normal in size. No mediastinal or hilar masses. No evidence of adenopathy. Clear lungs.  No pleural effusion or pneumothorax. Skeletal structures are demineralized but grossly intact. IMPRESSION: No active cardiopulmonary disease. Electronically Signed   By: Lajean Manes M.D.   On: 08/17/2017 19:54    Assessment/Plan Principal Problem:   Acute encephalopathy Active Problems:   DM type 2 (diabetes mellitus, type 2) ([place])   Hyperlipidemia   Chronic kidney disease   Hypertension   Acute renal failure ([place])     1. Acute encephalopathy with weakness and rigors.  This is suspected to be a metabolic process related to her profound dehydration with AKI versus possible sepsis with a bacteremia.  Continue on empiric antibiotics with Zosyn and vancomycin with pharmacy to dose.  Await blood culture results.  Lactic acid repeated in a.m. No need for head CT at this time with no confusion, HA, or focal neurological deficits present. Neurochecks. 2. AKI on CKD stage IV.  Continue gentle hydration as patient does appear somewhat dehydrated.  Reported poor  oral intake at this time.  Continue to monitor I's and O's as well as daily weights and avoid nephrotoxic agents to include home metformin as well as irbesartan.  I will also hold torsemide. 3. Type 2 diabetes with hyperglycemia.  Hold home oral agents and start on sliding scale insulin with carb modified diet. Continue home Tresiba at half dose. 4. Hypertension.  Currently well controlled.  Continue home hydralazine as well as amlodipine and hold irbesartan.  Continue on metoprolol. 5. Chronic lumbago. Continue home narcotic pain medications. 6. Dyslipidemia.  Continue atorvastatin. 7. GERD.  Maintain on PPI.  DVT prophylaxis: Heparin Code Status: DNR Family Communication: None Disposition Plan:Hydration, IV abx for possible sepsis Consults called:None Admission status: Obs, MedSurg   Duilio Heritage Darleen Crocker DO [facility] Pager [phone]  If 7PM-7AM, please contact night-coverage [email or web address] Password [facility]  08/17/2017, 10:43 PM

## 2017-08-17 NOTE — Progress Notes (Signed)
Pharmacy Note:  Initial antibiotic(s) regimen of Vancomycin and zosyn ordered by EDP to treat sepsis.  Estimated Creatinine Clearance: 17.1 mL/min (A) (by C-G formula based on SCr of 2.56 mg/dL (H)).   Allergies  Allergen Reactions  . Bee Venom Anaphylaxis  . Hydromorphone Hcl     Other reaction(s): Agitation, Confusion  . Demerol Nausea Only  . Other Other (See Comments)    Bee stings-swelling    Vitals:   08/17/17 1847 08/17/17 1938  BP: 113/71 115/79  Pulse: 80 82  Resp:  20  Temp: 99.7 F (37.6 C)   SpO2: 90% 97%    Anti-infectives (From admission, onward)   Start     Dose/Rate Route Frequency Ordered Stop   08/17/17 2230  vancomycin (VANCOCIN) 2,000 mg in sodium chloride 0.9 % 500 mL IVPB     2,000 mg 250 mL/hr over 120 Minutes Intravenous  Once 08/17/17 2212     08/17/17 2200  piperacillin-tazobactam (ZOSYN) IVPB 3.375 g     3.375 g 100 mL/hr over 30 Minutes Intravenous  Once 08/17/17 2151     08/17/17 2200  vancomycin (VANCOCIN) IVPB 1000 mg/200 mL premix  Status:  Discontinued     1,000 mg 200 mL/hr over 60 Minutes Intravenous  Once 08/17/17 2151 08/17/17 2212     Plan: Initial dose(s) of Vancomycin 2000mg  IV and zosyn 3.375gm IV X 1 ordered. F/U admission orders for further dosing if therapy continued.  Isac Sarna, BS [place], [place] Clinical Pharmacist Pager [phone] 08/17/2017 10:13 PM

## 2017-08-17 NOTE — ED Notes (Signed)
Date and time results received: 08/17/17 2128 (use smartphrase ".now" to insert current time)  Test: Lactic acid Critical Value: 2.0  Name of Provider Notified: Dr. Rogene Houston  Orders Received? Or Actions Taken?: none at this time

## 2017-08-18 ENCOUNTER — Encounter (HOSPITAL_COMMUNITY): Payer: Self-pay

## 2017-08-18 ENCOUNTER — Other Ambulatory Visit: Payer: Self-pay

## 2017-08-18 DIAGNOSIS — G8929 Other chronic pain: Secondary | ICD-10-CM | POA: Diagnosis present

## 2017-08-18 DIAGNOSIS — I5032 Chronic diastolic (congestive) heart failure: Secondary | ICD-10-CM | POA: Diagnosis not present

## 2017-08-18 DIAGNOSIS — D631 Anemia in chronic kidney disease: Secondary | ICD-10-CM | POA: Diagnosis not present

## 2017-08-18 DIAGNOSIS — I130 Hypertensive heart and chronic kidney disease with heart failure and stage 1 through stage 4 chronic kidney disease, or unspecified chronic kidney disease: Secondary | ICD-10-CM | POA: Diagnosis not present

## 2017-08-18 DIAGNOSIS — Z9841 Cataract extraction status, right eye: Secondary | ICD-10-CM | POA: Diagnosis not present

## 2017-08-18 DIAGNOSIS — K219 Gastro-esophageal reflux disease without esophagitis: Secondary | ICD-10-CM | POA: Diagnosis present

## 2017-08-18 DIAGNOSIS — E119 Type 2 diabetes mellitus without complications: Secondary | ICD-10-CM | POA: Diagnosis not present

## 2017-08-18 DIAGNOSIS — E1122 Type 2 diabetes mellitus with diabetic chronic kidney disease: Secondary | ICD-10-CM | POA: Diagnosis not present

## 2017-08-18 DIAGNOSIS — G9341 Metabolic encephalopathy: Secondary | ICD-10-CM | POA: Diagnosis not present

## 2017-08-18 DIAGNOSIS — N184 Chronic kidney disease, stage 4 (severe): Secondary | ICD-10-CM | POA: Diagnosis not present

## 2017-08-18 DIAGNOSIS — Z90710 Acquired absence of both cervix and uterus: Secondary | ICD-10-CM | POA: Diagnosis not present

## 2017-08-18 DIAGNOSIS — M8580 Other specified disorders of bone density and structure, unspecified site: Secondary | ICD-10-CM | POA: Diagnosis present

## 2017-08-18 DIAGNOSIS — D519 Vitamin B12 deficiency anemia, unspecified: Secondary | ICD-10-CM | POA: Diagnosis present

## 2017-08-18 DIAGNOSIS — Z87442 Personal history of urinary calculi: Secondary | ICD-10-CM | POA: Diagnosis not present

## 2017-08-18 DIAGNOSIS — Z9049 Acquired absence of other specified parts of digestive tract: Secondary | ICD-10-CM | POA: Diagnosis not present

## 2017-08-18 DIAGNOSIS — E785 Hyperlipidemia, unspecified: Secondary | ICD-10-CM | POA: Diagnosis present

## 2017-08-18 DIAGNOSIS — H269 Unspecified cataract: Secondary | ICD-10-CM | POA: Diagnosis not present

## 2017-08-18 DIAGNOSIS — Z7984 Long term (current) use of oral hypoglycemic drugs: Secondary | ICD-10-CM | POA: Diagnosis not present

## 2017-08-18 DIAGNOSIS — Z85820 Personal history of malignant melanoma of skin: Secondary | ICD-10-CM | POA: Diagnosis not present

## 2017-08-18 DIAGNOSIS — R4182 Altered mental status, unspecified: Secondary | ICD-10-CM | POA: Diagnosis not present

## 2017-08-18 DIAGNOSIS — I10 Essential (primary) hypertension: Secondary | ICD-10-CM | POA: Diagnosis not present

## 2017-08-18 DIAGNOSIS — Z9842 Cataract extraction status, left eye: Secondary | ICD-10-CM | POA: Diagnosis not present

## 2017-08-18 DIAGNOSIS — M545 Low back pain: Secondary | ICD-10-CM | POA: Diagnosis present

## 2017-08-18 DIAGNOSIS — Z853 Personal history of malignant neoplasm of breast: Secondary | ICD-10-CM | POA: Diagnosis not present

## 2017-08-18 DIAGNOSIS — N179 Acute kidney failure, unspecified: Secondary | ICD-10-CM | POA: Diagnosis not present

## 2017-08-18 DIAGNOSIS — M1990 Unspecified osteoarthritis, unspecified site: Secondary | ICD-10-CM | POA: Diagnosis not present

## 2017-08-18 DIAGNOSIS — Z96651 Presence of right artificial knee joint: Secondary | ICD-10-CM | POA: Diagnosis present

## 2017-08-18 LAB — GLUCOSE, CAPILLARY
GLUCOSE-CAPILLARY: 151 mg/dL — AB (ref 65–99)
GLUCOSE-CAPILLARY: 182 mg/dL — AB (ref 65–99)
GLUCOSE-CAPILLARY: 153 mg/dL — AB (ref 65–99)
Glucose-Capillary: 160 mg/dL — ABNORMAL HIGH (ref 65–99)
Glucose-Capillary: 202 mg/dL — ABNORMAL HIGH (ref 65–99)

## 2017-08-18 LAB — BASIC METABOLIC PANEL
ANION GAP: 9 (ref 5–15)
BUN: 40 mg/dL — ABNORMAL HIGH (ref 6–20)
CHLORIDE: 105 mmol/L (ref 101–111)
CO2: 27 mmol/L (ref 22–32)
Calcium: 8.3 mg/dL — ABNORMAL LOW (ref 8.9–10.3)
Creatinine, Ser: 2.13 mg/dL — ABNORMAL HIGH (ref 0.44–1.00)
GFR calc Af Amer: 24 mL/min — ABNORMAL LOW (ref >60)
GFR, EST NON AFRICAN AMERICAN: 21 mL/min — AB (ref >60)
Glucose, Bld: 187 mg/dL — ABNORMAL HIGH (ref 65–99)
Potassium: 4.5 mmol/L (ref 3.5–5.1)
SODIUM: 141 mmol/L (ref 135–145)

## 2017-08-18 LAB — CBC
HEMATOCRIT: 31.3 % — AB (ref 36.0–46.0)
Hemoglobin: 9.4 g/dL — ABNORMAL LOW (ref 12.0–15.0)
MCH: 28.2 pg (ref 26.0–34.0)
MCHC: 30 g/dL (ref 30.0–36.0)
MCV: 94 fL (ref 78.0–100.0)
Platelets: 126 10*3/uL — ABNORMAL LOW (ref 150–400)
RBC: 3.33 MIL/uL — AB (ref 3.87–5.11)
RDW: 14.6 % (ref 11.5–15.5)
WBC: 6.4 10*3/uL (ref 4.0–10.5)

## 2017-08-18 LAB — LACTIC ACID, PLASMA: Lactic Acid, Venous: 1 mmol/L (ref 0.5–1.9)

## 2017-08-18 MED ORDER — METOPROLOL SUCCINATE ER 50 MG PO TB24
100.0000 mg | ORAL_TABLET | Freq: Every morning | ORAL | Status: DC
  Administered 2017-08-18 – 2017-08-20 (×3): 100 mg via ORAL
  Filled 2017-08-18 (×3): qty 2

## 2017-08-18 MED ORDER — HYDRALAZINE HCL 25 MG PO TABS
25.0000 mg | ORAL_TABLET | Freq: Every day | ORAL | Status: DC
Start: 2017-08-18 — End: 2017-08-21
  Administered 2017-08-18 – 2017-08-20 (×3): 25 mg via ORAL
  Filled 2017-08-18 (×3): qty 1

## 2017-08-18 MED ORDER — HYDRALAZINE HCL 25 MG PO TABS
50.0000 mg | ORAL_TABLET | Freq: Every morning | ORAL | Status: DC
  Administered 2017-08-18 – 2017-08-21 (×4): 50 mg via ORAL
  Filled 2017-08-18 (×4): qty 2

## 2017-08-18 MED ORDER — PIPERACILLIN-TAZOBACTAM 3.375 G IVPB
3.3750 g | Freq: Two times a day (BID) | INTRAVENOUS | Status: DC
  Administered 2017-08-18 – 2017-08-19 (×4): 3.375 g via INTRAVENOUS
  Filled 2017-08-18 (×4): qty 50

## 2017-08-18 MED ORDER — VANCOMYCIN HCL IN DEXTROSE 1-5 GM/200ML-% IV SOLN
1000.0000 mg | INTRAVENOUS | Status: DC
  Administered 2017-08-20: 1000 mg via INTRAVENOUS
  Filled 2017-08-18: qty 200

## 2017-08-18 NOTE — Progress Notes (Signed)
Pharmacy Antibiotic Note  [name] is a 84 y.o. female admitted on 08/17/2017 with sepsis.  Pharmacy has been consulted for vancomycin and Zosyn  dosing. MRSA PCR screen is positive and blood cultures are in progress.  Plan: Continue vancomycin 1g IV  q48h.  Goal trough range 15-20 mcg/mL. Continue Zosyn 3.375g IV q12h (4-hr infusion) Monitor CBC, renal function and culture results.   Weight: 195 lb (88.5 kg)  Temp (24hrs), Avg:98.5 F (36.9 C), Min:97.5 F (36.4 C), Max:99.7 F (37.6 C)  Recent Labs  Lab 08/17/17 2015 08/17/17 2016 08/17/17 2310 08/18/17 0703  WBC 7.3  --   --  6.4  CREATININE 2.56*  --   --  2.13*  LATICACIDVEN  --  2.0* 1.49 1.0    Estimated Creatinine Clearance: 20.5 mL/min (A) (by C-G formula based on SCr of 2.13 mg/dL (H)).    Allergies  Allergen Reactions  . Bee Venom Anaphylaxis  . Hydromorphone Hcl     Other reaction(s): Agitation, Confusion  . Demerol Nausea Only  . Other Other (See Comments)    Bee stings-swelling    Antimicrobials this admission: 6/22 Zosyn  >> 6/22 vancomycin >>   Dose adjustments this admission: n/a  Microbiology results: 6/21 BCx2: ng <12hr 6/21 UCx: ng <12hr 6/21 MRSA PCR: positive  Thank you for allowing pharmacy to be a part of this patient's care.  Despina Pole 08/18/2017 5:43 PM

## 2017-08-18 NOTE — Progress Notes (Signed)
Subjective: She was admitted with confusion.  She had a similar episode about 3 weeks ago and had urinary tract infection at that time.  She was complaining of chest congestion prior to admission this time and urine looks okay.  She says she feels better.  Objective: Vital signs in last 24 hours: Temp:  [97.9 F (36.6 C)-99.7 F (37.6 C)] 98.3 F (36.8 C) (06/22 0500) Pulse Rate:  [74-82] 74 (06/22 0500) Resp:  [16-20] 18 (06/22 0500) BP: (101-137)/(49-79) 124/72 (06/22 0500) SpO2:  [90 %-99 %] 99 % (06/22 0500) Weight:  [88.5 kg (195 lb)] 88.5 kg (195 lb) (06/21 2200) Weight change:     Intake/Output from previous day: 06/21 0701 - 06/22 0700 In: 628.8 [P.O.:240; I.V.:388.8] Out: 300 [Urine:300]  PHYSICAL EXAM General appearance: alert, cooperative and no distress Resp: clear to auscultation bilaterally Cardio: regular rate and rhythm, S1, S2 normal, no murmur, click, rub or gallop GI: soft, non-tender; bowel sounds normal; no masses,  no organomegaly Extremities: extremities normal, atraumatic, no cyanosis or edema  Lab Results:  Results for orders placed or performed during the hospital encounter of 08/17/17 (from the past 48 hour(s))  Culture, blood (Routine X 2) w Reflex to ID Panel     Status: None (Preliminary result)   Collection Time: 08/17/17  8:11 PM  Result Value Ref Range   Specimen Description BLOOD RIGHT ARM    Special Requests      BOTTLES DRAWN AEROBIC AND ANAEROBIC Blood Culture adequate volume DRAWN BY RN   Culture      NO GROWTH < 12 HOURS Performed at [facility], [address] [place], [place] [address]    Report Status PENDING   Comprehensive metabolic panel     Status: Abnormal   Collection Time: 08/17/17  8:15 PM  Result Value Ref Range   Sodium 138 135 - 145 mmol/L   Potassium 4.8 3.5 - 5.1 mmol/L   Chloride 101 101 - 111 mmol/L   CO2 27 22 - 32 mmol/L   Glucose, Bld 242 (H) 65 - 99 mg/dL   BUN 43 (H) 6 - 20 mg/dL   Creatinine, Ser 2.56  (H) 0.44 - 1.00 mg/dL   Calcium 8.7 (L) 8.9 - 10.3 mg/dL   Total Protein 7.3 6.5 - 8.1 g/dL   Albumin 3.6 3.5 - 5.0 g/dL   AST 34 15 - 41 U/L   ALT 21 14 - 54 U/L   Alkaline Phosphatase 121 38 - 126 U/L   Total Bilirubin 0.6 0.3 - 1.2 mg/dL   GFR calc non Af Amer 16 (L) >60 mL/min   GFR calc Af Amer 19 (L) >60 mL/min    Comment: (NOTE) The eGFR has been calculated using the CKD EPI equation. This calculation has not been validated in all clinical situations. eGFR's persistently <60 mL/min signify possible Chronic Kidney Disease.    Anion gap 10 5 - 15    Comment: Performed at [facility], [address] [place], [place] [address]  Lipase, blood     Status: None   Collection Time: 08/17/17  8:15 PM  Result Value Ref Range   Lipase 26 11 - 51 U/L    Comment: Performed at [facility], [address] [place], [place] [address]  CBC with Differential/Platelet     Status: Abnormal   Collection Time: 08/17/17  8:15 PM  Result Value Ref Range   WBC 7.3 4.0 - 10.5 K/uL   RBC 3.72 (L) 3.87 - 5.11 MIL/uL  Hemoglobin 10.3 (L) 12.0 - 15.0 g/dL   HCT 35.0 (L) 36.0 - 46.0 %   MCV 94.1 78.0 - 100.0 fL   MCH 27.7 26.0 - 34.0 pg   MCHC 29.4 (L) 30.0 - 36.0 g/dL   RDW 14.7 11.5 - 15.5 %   Platelets 155 150 - 400 K/uL   Neutrophils Relative % 66 %   Neutro Abs 4.8 1.7 - 7.7 K/uL   Lymphocytes Relative 22 %   Lymphs Abs 1.6 0.7 - 4.0 K/uL   Monocytes Relative 7 %   Monocytes Absolute 0.5 0.1 - 1.0 K/uL   Eosinophils Relative 5 %   Eosinophils Absolute 0.4 0.0 - 0.7 K/uL   Basophils Relative 0 %   Basophils Absolute 0.0 0.0 - 0.1 K/uL    Comment: Performed at [facility], [address], [place], [place] [address]  Troponin I     Status: None   Collection Time: 08/17/17  8:15 PM  Result Value Ref Range   Troponin I <0.03 <0.03 ng/mL    Comment: Performed at [facility], [address], [place], [place] [address]  Brain natriuretic peptide     Status: None   Collection Time: 08/17/17   8:15 PM  Result Value Ref Range   B Natriuretic Peptide 49.0 0.0 - 100.0 pg/mL    Comment: Performed at [facility], [address] [place], [place] [address]  Protime-INR     Status: None   Collection Time: 08/17/17  8:15 PM  Result Value Ref Range   Prothrombin Time 14.0 11.4 - 15.2 seconds   INR 1.09     Comment: Performed at [facility], [address] [place], [place] [address]  Culture, blood (Routine X 2) w Reflex to ID Panel     Status: None (Preliminary result)   Collection Time: 08/17/17  8:15 PM  Result Value Ref Range   Specimen Description RIGHT ANTECUBITAL    Special Requests      BOTTLES DRAWN AEROBIC AND ANAEROBIC Blood Culture adequate volume   Culture      NO GROWTH < 12 HOURS Performed at [facility], [address], [place], [place] [address]    Report Status PENDING   Lactic acid, plasma     Status: Abnormal   Collection Time: 08/17/17  8:16 PM  Result Value Ref Range   Lactic Acid, Venous 2.0 (HH) 0.5 - 1.9 mmol/L    Comment: CRITICAL RESULT CALLED TO, READ BACK BY AND VERIFIED WITH: MOORE,M @ 2126 ON 08/17/17 BY JUW Performed at [facility], [address], [place],  [address]   Urinalysis, Routine w reflex microscopic     Status: Abnormal   Collection Time: 08/17/17  9:05 PM  Result Value Ref Range   Color, Urine YELLOW YELLOW   APPearance CLEAR CLEAR   Specific Gravity, Urine 1.013 1.005 - 1.030   pH 5.0 5.0 - 8.0   Glucose, UA NEGATIVE NEGATIVE mg/dL   Hgb urine dipstick NEGATIVE NEGATIVE   Bilirubin Urine NEGATIVE NEGATIVE   Ketones, ur NEGATIVE NEGATIVE mg/dL   Protein, ur NEGATIVE NEGATIVE mg/dL   Nitrite NEGATIVE NEGATIVE   Leukocytes, UA TRACE (A) NEGATIVE   RBC / HPF 0-5 0 - 5 RBC/hpf   WBC, UA 6-10 0 - 5 WBC/hpf   Bacteria, UA NONE SEEN NONE SEEN   Squamous Epithelial / LPF 0-5 0 - 5   Mucus PRESENT    Hyaline Casts, UA PRESENT    Non Squamous Epithelial 0-5 (A)  NONE SEEN    Comment: Performed at [facility], [address], [place], [place] [address]  I-Stat CG4 Lactic Acid, ED  (not at  [facility])     Status: None   Collection Time: 08/17/17 11:10 PM  Result Value Ref Range   Lactic Acid, Venous 1.49 0.5 - 1.9 mmol/L  Glucose, capillary     Status: Abnormal   Collection Time: 08/18/17 12:20 AM  Result Value Ref Range   Glucose-Capillary 202 (H) 65 - 99 mg/dL  Basic metabolic panel     Status: Abnormal   Collection Time: 08/18/17  7:03 AM  Result Value Ref Range   Sodium 141 135 - 145 mmol/L   Potassium 4.5 3.5 - 5.1 mmol/L   Chloride 105 101 - 111 mmol/L   CO2 27 22 - 32 mmol/L   Glucose, Bld 187 (H) 65 - 99 mg/dL   BUN 40 (H) 6 - 20 mg/dL   Creatinine, Ser 2.13 (H) 0.44 - 1.00 mg/dL   Calcium 8.3 (L) 8.9 - 10.3 mg/dL   GFR calc non Af Amer 21 (L) >60 mL/min   GFR calc Af Amer 24 (L) >60 mL/min    Comment: (NOTE) The eGFR has been calculated using the CKD EPI equation. This calculation has not been validated in all clinical situations. eGFR's persistently <60 mL/min signify possible Chronic Kidney Disease.    Anion gap 9 5 - 15    Comment: Performed at [facility], [address] [place], [place] [address]  CBC     Status: Abnormal   Collection Time: 08/18/17  7:03 AM  Result Value Ref Range   WBC 6.4 4.0 - 10.5 K/uL   RBC 3.33 (L) 3.87 - 5.11 MIL/uL   Hemoglobin 9.4 (L) 12.0 - 15.0 g/dL   HCT 31.3 (L) 36.0 - 46.0 %   MCV 94.0 78.0 - 100.0 fL   MCH 28.2 26.0 - 34.0 pg   MCHC 30.0 30.0 - 36.0 g/dL   RDW 14.6 11.5 - 15.5 %   Platelets 126 (L) 150 - 400 K/uL    Comment: Performed at [facility], [address] [place], [place] [address]  Lactic acid, plasma     Status: None   Collection Time: 08/18/17  7:03 AM  Result Value Ref Range   Lactic Acid, Venous 1.0 0.5 - 1.9 mmol/L    Comment: Performed at [facility], [address], [place], [place] [address]  Glucose, capillary     Status: Abnormal   Collection Time: 08/18/17  7:44 AM  Result Value Ref Range   Glucose-Capillary 151 (H) 65 - 99  mg/dL    ABGS No results for input(s): PHART, PO2ART, TCO2, HCO3 in the last 72 hours.  Invalid input(s): PCO2 CULTURES Recent Results (from the past 240 hour(s))  Culture, blood (Routine X 2) w Reflex to ID Panel     Status: None (Preliminary result)   Collection Time: 08/17/17  8:11 PM  Result Value Ref Range Status   Specimen Description BLOOD RIGHT ARM  Final   Special Requests   Final    BOTTLES DRAWN AEROBIC AND ANAEROBIC Blood Culture adequate volume DRAWN BY RN   Culture   Final    NO GROWTH < 12 HOURS Performed at [facility], [address], [place], [place] [address]    Report Status PENDING  Incomplete  Culture, blood (Routine X 2) w Reflex to ID Panel     Status: None (Preliminary result)   Collection Time:  08/17/17  8:15 PM  Result Value Ref Range Status   Specimen Description RIGHT ANTECUBITAL  Final   Special Requests   Final    BOTTLES DRAWN AEROBIC AND ANAEROBIC Blood Culture adequate volume   Culture   Final    NO GROWTH < 12 HOURS Performed at [facility], [address] [place], [place] [address]    Report Status PENDING  Incomplete   Studies/Results: Dg Chest 2 View  Result Date: 08/17/2017 CLINICAL DATA:  altered mental status, shaking all over and slight confusion with weakness, pt stated onset today. History of HTN, DM, CKD, cirrhosis, skin and breast cancer, lumpectomy. EXAM: CHEST - 2 VIEW COMPARISON:  07/25/2017 FINDINGS: Cardiac silhouette is normal in size. No mediastinal or hilar masses. No evidence of adenopathy. Clear lungs.  No pleural effusion or pneumothorax. Skeletal structures are demineralized but grossly intact. IMPRESSION: No active cardiopulmonary disease. Electronically Signed   By: Lajean Manes M.D.   On: 08/17/2017 19:54    Medications:  Prior to Admission:  Medications Prior to Admission  Medication Sig Dispense Refill Last Dose  . ALPHAGAN P 0.1 % SOLN Place 1 drop into both eyes 2 (two) times daily.    07/25/2017 at Unknown  time  . amLODipine (NORVASC) 5 MG tablet Take 5 mg by mouth every morning.    07/25/2017 at Unknown time  . anastrozole (ARIMIDEX) 1 MG tablet Take 1 mg by mouth daily.   07/25/2017 at Unknown time  . aspirin EC 81 MG tablet Take 81 mg by mouth daily.   07/25/2017 at Unknown time  . atorvastatin (LIPITOR) 10 MG tablet Take 10 mg by mouth every evening.    07/24/2017 at Unknown time  . Bacillus Coagulans-Inulin (PROBIOTIC FORMULA PO) Take 1 capsule by mouth daily.   07/25/2017 at Unknown time  . cefdinir (OMNICEF) 300 MG capsule Take 1 capsule (300 mg total) by mouth 2 (two) times daily. 20 capsule 0   . cyanocobalamin (,VITAMIN B-12,) 1000 MCG/ML injection Inject 1,000 mcg into the muscle every 30 (thirty) days.    Past Month at Unknown time  . dorzolamide (TRUSOPT) 2 % ophthalmic solution PLACE ONE DROP IN [facility] EYE TWICE DAILY  4 07/25/2017 at Unknown time  . EPIPEN 2-PAK 0.3 MG/0.3ML SOAJ injection Inject 0.3 mg into the muscle once.    UNKNOWN  . gabapentin (NEURONTIN) 600 MG tablet Take 600 mg by mouth at bedtime.    07/24/2017 at Unknown time  . glipiZIDE (GLUCOTROL) 5 MG tablet Take 5 mg by mouth daily before breakfast.    07/25/2017 at Unknown time  . hydrALAZINE (APRESOLINE) 25 MG tablet Take 25-50 mg by mouth See admin instructions. '50mg'$  in the morning and '25mg'$  at bedtime   07/25/2017 at Unknown time  . irbesartan (AVAPRO) 150 MG tablet Take 150 mg by mouth daily.   07/25/2017 at Unknown time  . latanoprost (XALATAN) 0.005 % ophthalmic solution Place 1 drop into both eyes at bedtime.    07/24/2017 at Unknown time  . metFORMIN (GLUCOPHAGE) 1000 MG tablet Take 1,000 mg by mouth daily with breakfast.    07/25/2017 at Unknown time  . metoprolol succinate (TOPROL-XL) 100 MG 24 hr tablet Take 100 mg by mouth every morning. Take with or immediately following a meal.   07/25/2017 at [place]  . mirtazapine (REMERON) 7.5 MG tablet Take 7.5 mg by mouth at bedtime.    07/24/2017 at Unknown time  . MYRBETRIQ 25 MG TB24  tablet Take 25 mg by  mouth daily.    07/25/2017 at Unknown time  . omeprazole (PRILOSEC) 20 MG capsule Take 20 mg by mouth daily.   07/25/2017 at Unknown time  . Oxycodone HCl 10 MG TABS Take 10 mg by mouth 3 (three) times daily.    07/25/2017 at Unknown time  . torsemide (DEMADEX) 20 MG tablet Take 20 mg by mouth 2 (two) times daily.    07/25/2017 at Unknown time  . TRESIBA FLEXTOUCH 100 UNIT/ML SOPN Inject 70 Units into the skin every morning.    07/25/2017 at Unknown time   Scheduled: . acidophilus   Oral Daily  . amLODipine  5 mg Oral q morning - 10a  . anastrozole  1 mg Oral Daily  . aspirin EC  81 mg Oral Daily  . atorvastatin  10 mg Oral QPM  . brimonidine  1 drop Both Eyes BID  . dorzolamide  1 drop Both Eyes BID  . gabapentin  600 mg Oral QHS  . heparin  5,000 Units Subcutaneous Q8H  . hydrALAZINE  25 mg Oral QHS  . hydrALAZINE  50 mg Oral q morning - 10a  . insulin aspart  0-15 Units Subcutaneous TID WC  . insulin aspart  0-5 Units Subcutaneous QHS  . insulin degludec  35 Units Subcutaneous q morning - 10a  . latanoprost  1 drop Both Eyes QHS  . metoprolol succinate  100 mg Oral q morning - 10a  . mirabegron ER  25 mg Oral Daily  . mirtazapine  7.5 mg Oral QHS  . oxyCODONE  10 mg Oral TID  . pantoprazole  40 mg Oral Daily   Continuous: . sodium chloride 75 mL/hr at 08/18/17 0149  . piperacillin-tazobactam (ZOSYN)  IV     OZD:[id number] **OR** acetaminophen, ondansetron **OR** ondansetron (ZOFRAN) IV  Assesment: She was admitted with acute encephalopathy presumably from infection.  We do not have a source yet.  She is dehydrated and has acute kidney injury in addition to her chronic renal failure  She has diabetes which is fairly well controlled  She has chronic low back pain on pain medication Principal Problem:   Acute encephalopathy Active Problems:   DM type 2 (diabetes mellitus, type 2) (HCC)   Hyperlipidemia   Chronic kidney disease   Hypertension   Acute  renal failure ([place])   Encephalopathy acute    Plan: Continue treatments.  Full admission.  Repeat blood tomorrow.    LOS: 0 days   Yidel Teuscher L 08/18/2017, 10:19 AM

## 2017-08-19 LAB — BASIC METABOLIC PANEL
ANION GAP: 8 (ref 5–15)
BUN: 31 mg/dL — ABNORMAL HIGH (ref 6–20)
CALCIUM: 8.3 mg/dL — AB (ref 8.9–10.3)
CO2: 24 mmol/L (ref 22–32)
CREATININE: 1.71 mg/dL — AB (ref 0.44–1.00)
Chloride: 106 mmol/L (ref 101–111)
GFR calc non Af Amer: 27 mL/min — ABNORMAL LOW (ref >60)
GFR, EST AFRICAN AMERICAN: 31 mL/min — AB (ref >60)
Glucose, Bld: 297 mg/dL — ABNORMAL HIGH (ref 65–99)
Potassium: 4.5 mmol/L (ref 3.5–5.1)
SODIUM: 138 mmol/L (ref 135–145)

## 2017-08-19 LAB — GLUCOSE, CAPILLARY
GLUCOSE-CAPILLARY: 110 mg/dL — AB (ref 65–99)
GLUCOSE-CAPILLARY: 269 mg/dL — AB (ref 65–99)
GLUCOSE-CAPILLARY: 282 mg/dL — AB (ref 65–99)
Glucose-Capillary: 230 mg/dL — ABNORMAL HIGH (ref 65–99)

## 2017-08-19 LAB — URINE CULTURE: CULTURE: NO GROWTH

## 2017-08-19 MED ORDER — FLUCONAZOLE 150 MG PO TABS
150.0000 mg | ORAL_TABLET | Freq: Once | ORAL | Status: AC
  Administered 2017-08-19: 150 mg via ORAL
  Filled 2017-08-19: qty 1

## 2017-08-19 NOTE — Evaluation (Signed)
Physical Therapy Evaluation Patient Details Name: CALEESI KOHL MRN: [id number] DOB: 1933/09/13 Today's Date: 08/19/2017   History of Present Illness  [name] is a 84 y.o. female with medical history significant for CKD stage III-IV, hypertension, grade 1 chronic diastolic CHF, dyslipidemia, type 2 diabetes, dyslipidemia, and chronic low back pain who presented to the ED with some Reiger's prior to dinner today.  She was also noted to be altered per family members and could not exactly remember certain details.  She was also generally noted to be weak.  She was recently discharged after treatment for UTI.  Her blood glucose was noted to be in the 200mg /dL range.    Clinical Impression  Patient limited for functional mobility as stated below secondary to BLE weakness, fatigue and poor standing balance.  Patient tends to lean over RW due to c/o stiffness in back with difficulty erecting trunk.  Patient tolerated sitting up in chair after therapy.   Patient will benefit from continued physical therapy in hospital and recommended venue below to increase strength, balance, endurance for safe ADLs and gait.    Follow Up Recommendations Home health PT;Supervision - Intermittent    Equipment Recommendations  None recommended by PT    Recommendations for Other Services       Precautions / Restrictions Precautions Precautions: Fall Restrictions Weight Bearing Restrictions: No      Mobility  Bed Mobility Overal bed mobility: Modified Independent             General bed mobility comments: increased time  Transfers Overall transfer level: Needs assistance Equipment used: Rolling walker (2 wheeled) Transfers: Sit to/from [facility] Sit to Stand: Supervision Stand pivot transfers: Min guard       General transfer comment: slow labored movement  Ambulation/Gait Ambulation/Gait assistance: Min guard Gait Distance (Feet): 30 Feet Assistive device:  Rolling walker (2 wheeled) Gait Pattern/deviations: Decreased step length - right;Decreased step length - left;Decreased stride length Gait velocity: decreased   General Gait Details: slow slightly labored cadence with tendency to push RW to far in front, unsteady with c/o stiffness in back  Stairs            Wheelchair Mobility    Modified Rankin (Stroke Patients Only)       Balance Overall balance assessment: Needs assistance Sitting-balance support: Feet supported;No upper extremity supported Sitting balance-Leahy Scale: Good     Standing balance support: Bilateral upper extremity supported;During functional activity Standing balance-Leahy Scale: Fair                               Pertinent Vitals/Pain Pain Assessment: No/denies pain    Home Living Family/patient expects to be discharged to:: Private residence Living Arrangements: Alone Available Help at Discharge: Family(daughter lives 2 doors down) Type of Home: House Home Access: Stairs to enter Entrance Stairs-Rails: Left;Can reach Advertising account executive of Steps: 2 Home Layout: One level Home Equipment: Environmental consultant - 2 wheels;Cane - single point;Shower seat;Hand held shower head      Prior Function Level of Independence: Needs assistance   Gait / Transfers Assistance Needed: household ambulator with RW  ADL's / Homemaking Assistance Needed: assisted by daughter        Hand Dominance        Extremity/Trunk Assessment   Upper Extremity Assessment Upper Extremity Assessment: Generalized weakness    Lower Extremity Assessment Lower Extremity Assessment: Generalized weakness    Cervical /  Trunk Assessment Cervical / Trunk Assessment: Normal  Communication   Communication: No difficulties  Cognition Arousal/Alertness: Awake/alert Behavior During Therapy: WFL for tasks assessed/performed Overall Cognitive Status: Within Functional Limits for tasks assessed                                         General Comments      Exercises     Assessment/Plan    PT Assessment Patient needs continued PT services  PT Problem List Decreased strength;Decreased activity tolerance;Decreased balance;Decreased mobility       PT Treatment Interventions Gait training;Functional mobility training;Therapeutic activities;Therapeutic exercise;Stair training;Patient/family education    PT Goals (Current goals can be found in the Care Plan section)  Acute Rehab PT Goals Patient Stated Goal: return home with family to assist PT Goal Formulation: With patient Time For Goal Achievement: 08/26/17 Potential to Achieve Goals: Good    Frequency Min 3X/week   Barriers to discharge        Co-evaluation               AM-PAC PT "6 Clicks" Daily Activity  Outcome Measure Difficulty turning over in bed (including adjusting bedclothes, sheets and blankets)?: None Difficulty moving from lying on back to sitting on the side of the bed? : None Difficulty sitting down on and standing up from a chair with arms (e.g., wheelchair, bedside commode, etc,.)?: A Little Help needed moving to and from a bed to chair (including a wheelchair)?: A Little Help needed walking in hospital room?: A Little Help needed climbing 3-5 steps with a railing? : A Lot 6 Click Score: 19    End of Session   Activity Tolerance: Patient tolerated treatment well;Patient limited by fatigue Patient left: in chair;with call bell/phone within reach Nurse Communication: Mobility status PT Visit Diagnosis: Unsteadiness on feet (R26.81);Other abnormalities of gait and mobility (R26.89);Muscle weakness (generalized) (M62.81)    Time: 9826-4158 PT Time Calculation (min) (ACUTE ONLY): 32 min   Charges:   PT Evaluation $PT Eval Moderate Complexity: 1 Mod PT Treatments $Therapeutic Activity: 23-37 mins   PT G Codes:        1:48 PM, 09/15/17 Lonell Grandchild, MPT Physical Therapist with  [facility] [phone] office [phone] mobile phone

## 2017-08-19 NOTE — Plan of Care (Signed)
  Problem: Acute Rehab PT Goals(only PT should resolve) Goal: Pt Will Go Supine/Side To Sit Outcome: Progressing Flowsheets (Taken 08/19/2017 1349) Pt will go Supine/Side to Sit: Independently Goal: Patient Will Transfer Sit To/From Stand Outcome: Progressing Flowsheets (Taken 08/19/2017 1349) Patient will transfer sit to/from stand: with modified independence Goal: Pt Will Transfer Bed To Chair/Chair To Bed Outcome: Progressing Flowsheets (Taken 08/19/2017 1349) Pt will Transfer Bed to Chair/Chair to Bed: with supervision Goal: Pt Will Ambulate Outcome: Progressing Flowsheets (Taken 08/19/2017 1349) Pt will Ambulate: 50 feet;with supervision;with rolling walker   1:50 PM, 08/19/17 Lonell Grandchild, MPT Physical Therapist with [facility] [phone] office [phone] mobile phone

## 2017-08-19 NOTE — Progress Notes (Signed)
Subjective: She says she feels better.  She has no new complaints.  Blood cultures and urine culture still pending.  Objective: Vital signs in last 24 hours: Temp:  [97.5 F (36.4 C)-98.9 F (37.2 C)] 98.6 F (37 C) (06/23 0658) Pulse Rate:  [70-90] 70 (06/23 0658) Resp:  [18-21] 18 (06/23 0658) BP: (104-145)/(56-65) 127/57 (06/23 0658) SpO2:  [95 %-99 %] 95 % (06/22 2224) Weight:  [93.8 kg (206 lb 12.7 oz)-94 kg (207 lb 3.7 oz)] 93.8 kg (206 lb 12.7 oz) (06/23 0600) Weight change: 5.549 kg (12 lb 3.7 oz)    Intake/Output from previous day: 06/22 0701 - 06/23 0700 In: 3194.4 [P.O.:1350; I.V.:1725; IV Piggyback:119.4] Out: 2500 [Urine:2500]  PHYSICAL EXAM General appearance: alert, cooperative and no distress Resp: clear to auscultation bilaterally Cardio: regular rate and rhythm, S1, S2 normal, no murmur, click, rub or gallop GI: soft, non-tender; bowel sounds normal; no masses,  no organomegaly Extremities: extremities normal, atraumatic, no cyanosis or edema  Lab Results:  Results for orders placed or performed during the hospital encounter of 08/17/17 (from the past 48 hour(s))  Culture, blood (Routine X 2) w Reflex to ID Panel     Status: None (Preliminary result)   Collection Time: 08/17/17  8:11 PM  Result Value Ref Range   Specimen Description BLOOD RIGHT ARM    Special Requests      BOTTLES DRAWN AEROBIC AND ANAEROBIC Blood Culture adequate volume DRAWN BY RN   Culture      NO GROWTH 2 DAYS Performed at [facility], [address], [place], [place] [address]    Report Status PENDING   Comprehensive metabolic panel     Status: Abnormal   Collection Time: 08/17/17  8:15 PM  Result Value Ref Range   Sodium 138 135 - 145 mmol/L   Potassium 4.8 3.5 - 5.1 mmol/L   Chloride 101 101 - 111 mmol/L   CO2 27 22 - 32 mmol/L   Glucose, Bld 242 (H) 65 - 99 mg/dL   BUN 43 (H) 6 - 20 mg/dL   Creatinine, Ser 2.56 (H) 0.44 - 1.00 mg/dL   Calcium 8.7 (L) 8.9 - 10.3 mg/dL    Total Protein 7.3 6.5 - 8.1 g/dL   Albumin 3.6 3.5 - 5.0 g/dL   AST 34 15 - 41 U/L   ALT 21 14 - 54 U/L   Alkaline Phosphatase 121 38 - 126 U/L   Total Bilirubin 0.6 0.3 - 1.2 mg/dL   GFR calc non Af Amer 16 (L) >60 mL/min   GFR calc Af Amer 19 (L) >60 mL/min    Comment: (NOTE) The eGFR has been calculated using the CKD EPI equation. This calculation has not been validated in all clinical situations. eGFR's persistently <60 mL/min signify possible Chronic Kidney Disease.    Anion gap 10 5 - 15    Comment: Performed at [facility], [address], [place], [place] [address]  Lipase, blood     Status: None   Collection Time: 08/17/17  8:15 PM  Result Value Ref Range   Lipase 26 11 - 51 U/L    Comment: Performed at [facility], [address], [place],  [address]  CBC with Differential/Platelet     Status: Abnormal   Collection Time: 08/17/17  8:15 PM  Result Value Ref Range   WBC 7.3 4.0 - 10.5 K/uL   RBC 3.72 (L) 3.87 - 5.11 MIL/uL   Hemoglobin 10.3 (L) 12.0 - 15.0 g/dL  HCT 35.0 (L) 36.0 - 46.0 %   MCV 94.1 78.0 - 100.0 fL   MCH 27.7 26.0 - 34.0 pg   MCHC 29.4 (L) 30.0 - 36.0 g/dL   RDW 14.7 11.5 - 15.5 %   Platelets 155 150 - 400 K/uL   Neutrophils Relative % 66 %   Neutro Abs 4.8 1.7 - 7.7 K/uL   Lymphocytes Relative 22 %   Lymphs Abs 1.6 0.7 - 4.0 K/uL   Monocytes Relative 7 %   Monocytes Absolute 0.5 0.1 - 1.0 K/uL   Eosinophils Relative 5 %   Eosinophils Absolute 0.4 0.0 - 0.7 K/uL   Basophils Relative 0 %   Basophils Absolute 0.0 0.0 - 0.1 K/uL    Comment: Performed at [facility], [address], [place], [place] [address]  Troponin I     Status: None   Collection Time: 08/17/17  8:15 PM  Result Value Ref Range   Troponin I <0.03 <0.03 ng/mL    Comment: Performed at [facility], [address] [place], [place] [address]  Brain natriuretic peptide     Status: None   Collection Time: 08/17/17  8:15 PM  Result Value Ref Range   B Natriuretic Peptide  49.0 0.0 - 100.0 pg/mL    Comment: Performed at [facility], [address] [place], [place] [address]  Protime-INR     Status: None   Collection Time: 08/17/17  8:15 PM  Result Value Ref Range   Prothrombin Time 14.0 11.4 - 15.2 seconds   INR 1.09     Comment: Performed at [facility], [address], [place], [place] [address]  Culture, blood (Routine X 2) w Reflex to ID Panel     Status: None (Preliminary result)   Collection Time: 08/17/17  8:15 PM  Result Value Ref Range   Specimen Description RIGHT ANTECUBITAL    Special Requests      BOTTLES DRAWN AEROBIC AND ANAEROBIC Blood Culture adequate volume   Culture      NO GROWTH 2 DAYS Performed at [facility], [address] [place], [place] [address]    Report Status PENDING   Lactic acid, plasma     Status: Abnormal   Collection Time: 08/17/17  8:16 PM  Result Value Ref Range   Lactic Acid, Venous 2.0 (HH) 0.5 - 1.9 mmol/L    Comment: CRITICAL RESULT CALLED TO, READ BACK BY AND VERIFIED WITH: MOORE,M @ 2126 ON 08/17/17 BY JUW Performed at [facility], [address] [place], [place] [address]   Urinalysis, Routine w reflex microscopic     Status: Abnormal   Collection Time: 08/17/17  9:05 PM  Result Value Ref Range   Color, Urine YELLOW YELLOW   APPearance CLEAR CLEAR   Specific Gravity, Urine 1.013 1.005 - 1.030   pH 5.0 5.0 - 8.0   Glucose, UA NEGATIVE NEGATIVE mg/dL   Hgb urine dipstick NEGATIVE NEGATIVE   Bilirubin Urine NEGATIVE NEGATIVE   Ketones, ur NEGATIVE NEGATIVE mg/dL   Protein, ur NEGATIVE NEGATIVE mg/dL   Nitrite NEGATIVE NEGATIVE   Leukocytes, UA TRACE (A) NEGATIVE   RBC / HPF 0-5 0 - 5 RBC/hpf   WBC, UA 6-10 0 - 5 WBC/hpf   Bacteria, UA NONE SEEN NONE SEEN   Squamous Epithelial / LPF 0-5 0 - 5   Mucus PRESENT    Hyaline Casts, UA PRESENT    Non Squamous Epithelial 0-5 (A) NONE SEEN    Comment: Performed at [facility], [address], [place], [place] [address]  I-Stat CG4 Lactic Acid, ED  (not  at  [facility])     Status: None   Collection Time: 08/17/17 11:10 PM  Result Value Ref Range   Lactic Acid, Venous 1.49 0.5 - 1.9 mmol/L  Glucose, capillary     Status: Abnormal   Collection Time: 08/18/17 12:20 AM  Result Value Ref Range   Glucose-Capillary 202 (H) 65 - 99 mg/dL  Basic metabolic panel     Status: Abnormal   Collection Time: 08/18/17  7:03 AM  Result Value Ref Range   Sodium 141 135 - 145 mmol/L   Potassium 4.5 3.5 - 5.1 mmol/L   Chloride 105 101 - 111 mmol/L   CO2 27 22 - 32 mmol/L   Glucose, Bld 187 (H) 65 - 99 mg/dL   BUN 40 (H) 6 - 20 mg/dL   Creatinine, Ser 2.13 (H) 0.44 - 1.00 mg/dL   Calcium 8.3 (L) 8.9 - 10.3 mg/dL   GFR calc non Af Amer 21 (L) >60 mL/min   GFR calc Af Amer 24 (L) >60 mL/min    Comment: (NOTE) The eGFR has been calculated using the CKD EPI equation. This calculation has not been validated in all clinical situations. eGFR's persistently <60 mL/min signify possible Chronic Kidney Disease.    Anion gap 9 5 - 15    Comment: Performed at [facility], [address] [place], [place] [address]  CBC     Status: Abnormal   Collection Time: 08/18/17  7:03 AM  Result Value Ref Range   WBC 6.4 4.0 - 10.5 K/uL   RBC 3.33 (L) 3.87 - 5.11 MIL/uL   Hemoglobin 9.4 (L) 12.0 - 15.0 g/dL   HCT 31.3 (L) 36.0 - 46.0 %   MCV 94.0 78.0 - 100.0 fL   MCH 28.2 26.0 - 34.0 pg   MCHC 30.0 30.0 - 36.0 g/dL   RDW 14.6 11.5 - 15.5 %   Platelets 126 (L) 150 - 400 K/uL    Comment: Performed at [facility], [address] [place], [place] [address]  Lactic acid, plasma     Status: None   Collection Time: 08/18/17  7:03 AM  Result Value Ref Range   Lactic Acid, Venous 1.0 0.5 - 1.9 mmol/L    Comment: Performed at [facility], [address] [place], [place] [address]  Glucose, capillary     Status: Abnormal   Collection Time: 08/18/17  7:44 AM  Result Value Ref Range   Glucose-Capillary 151 (H) 65 - 99 mg/dL  Glucose, capillary     Status: Abnormal   Collection  Time: 08/18/17 11:54 AM  Result Value Ref Range   Glucose-Capillary 182 (H) 65 - 99 mg/dL  Glucose, capillary     Status: Abnormal   Collection Time: 08/18/17  4:59 PM  Result Value Ref Range   Glucose-Capillary 160 (H) 65 - 99 mg/dL  Glucose, capillary     Status: Abnormal   Collection Time: 08/18/17 11:23 PM  Result Value Ref Range   Glucose-Capillary 153 (H) 65 - 99 mg/dL  Glucose, capillary     Status: Abnormal   Collection Time: 08/19/17  7:52 AM  Result Value Ref Range   Glucose-Capillary 110 (H) 65 - 99 mg/dL    ABGS No results for input(s): PHART, PO2ART, TCO2, HCO3 in the last 72 hours.  Invalid input(s): PCO2 CULTURES Recent Results (from the past 240 hour(s))  Culture, blood (Routine X 2) w Reflex  to ID Panel     Status: None (Preliminary result)   Collection Time: 08/17/17  8:11 PM  Result Value Ref Range Status   Specimen Description BLOOD RIGHT ARM  Final   Special Requests   Final    BOTTLES DRAWN AEROBIC AND ANAEROBIC Blood Culture adequate volume DRAWN BY RN   Culture   Final    NO GROWTH 2 DAYS Performed at [facility], [address] [place], [place] [address]    Report Status PENDING  Incomplete  Culture, blood (Routine X 2) w Reflex to ID Panel     Status: None (Preliminary result)   Collection Time: 08/17/17  8:15 PM  Result Value Ref Range Status   Specimen Description RIGHT ANTECUBITAL  Final   Special Requests   Final    BOTTLES DRAWN AEROBIC AND ANAEROBIC Blood Culture adequate volume   Culture   Final    NO GROWTH 2 DAYS Performed at [facility], [address], [place], [place] [address]    Report Status PENDING  Incomplete   Studies/Results: Dg Chest 2 View  Result Date: 08/17/2017 CLINICAL DATA:  altered mental status, shaking all over and slight confusion with weakness, pt stated onset today. History of HTN, DM, CKD, cirrhosis, skin and breast cancer, lumpectomy. EXAM: CHEST - 2 VIEW COMPARISON:  07/25/2017 FINDINGS: Cardiac silhouette  is normal in size. No mediastinal or hilar masses. No evidence of adenopathy. Clear lungs.  No pleural effusion or pneumothorax. Skeletal structures are demineralized but grossly intact. IMPRESSION: No active cardiopulmonary disease. Electronically Signed   By: Lajean Manes M.D.   On: 08/17/2017 19:54    Medications:  Prior to Admission:  Medications Prior to Admission  Medication Sig Dispense Refill Last Dose  . ALPHAGAN P 0.1 % SOLN Place 1 drop into both eyes 2 (two) times daily.    07/25/2017 at Unknown time  . amLODipine (NORVASC) 5 MG tablet Take 5 mg by mouth every morning.    07/25/2017 at Unknown time  . aspirin EC 81 MG tablet Take 81 mg by mouth daily.   07/25/2017 at Unknown time  . atorvastatin (LIPITOR) 10 MG tablet Take 10 mg by mouth every evening.    07/24/2017 at Unknown time  . Bacillus Coagulans-Inulin (PROBIOTIC FORMULA PO) Take 1 capsule by mouth daily.   07/25/2017 at Unknown time  . cyanocobalamin (,VITAMIN B-12,) 1000 MCG/ML injection Inject 1,000 mcg into the muscle every 30 (thirty) days.    Past Month at Unknown time  . dorzolamide (TRUSOPT) 2 % ophthalmic solution PLACE ONE DROP IN [facility] EYE TWICE DAILY  4 07/25/2017 at Unknown time  . EPIPEN 2-PAK 0.3 MG/0.3ML SOAJ injection Inject 0.3 mg into the muscle once.    UNKNOWN  . gabapentin (NEURONTIN) 600 MG tablet Take 600 mg by mouth at bedtime.    07/24/2017 at Unknown time  . glipiZIDE (GLUCOTROL) 5 MG tablet Take 5 mg by mouth daily before breakfast.    07/25/2017 at Unknown time  . hydrALAZINE (APRESOLINE) 25 MG tablet Take 25-50 mg by mouth See admin instructions. 42m in the morning and 275mat bedtime   07/25/2017 at Unknown time  . Insulin Degludec (TRESIBA FLEXTOUCH [place]) Inject into the skin.     . [name] Kitchenrbesartan (AVAPRO) 150 MG tablet Take 150 mg by mouth daily.   07/25/2017 at Unknown time  . latanoprost (XALATAN) 0.005 % ophthalmic solution Place 1 drop into both eyes at bedtime.    07/24/2017 at Unknown time  .  metoprolol  succinate (TOPROL-XL) 100 MG 24 hr tablet Take 100 mg by mouth every morning. Take with or immediately following a meal.   07/25/2017 at [place]  . mirtazapine (REMERON) 15 MG tablet Take by mouth at bedtime.    07/24/2017 at Unknown time  . MYRBETRIQ 25 MG TB24 tablet Take 25 mg by mouth daily.    07/25/2017 at Unknown time  . omeprazole (PRILOSEC) 20 MG capsule Take 20 mg by mouth daily.   07/25/2017 at Unknown time  . Oxycodone HCl 10 MG TABS Take 10 mg by mouth 3 (three) times daily.    07/25/2017 at Unknown time  . torsemide (DEMADEX) 20 MG tablet Take 20 mg by mouth 2 (two) times daily.    07/25/2017 at Unknown time   Scheduled: . acidophilus   Oral Daily  . amLODipine  5 mg Oral q morning - 10a  . anastrozole  1 mg Oral Daily  . aspirin EC  81 mg Oral Daily  . atorvastatin  10 mg Oral QPM  . brimonidine  1 drop Both Eyes BID  . dorzolamide  1 drop Both Eyes BID  . gabapentin  600 mg Oral QHS  . heparin  5,000 Units Subcutaneous Q8H  . hydrALAZINE  25 mg Oral QHS  . hydrALAZINE  50 mg Oral q morning - 10a  . insulin aspart  0-15 Units Subcutaneous TID WC  . insulin aspart  0-5 Units Subcutaneous QHS  . insulin degludec  35 Units Subcutaneous q morning - 10a  . latanoprost  1 drop Both Eyes QHS  . metoprolol succinate  100 mg Oral q morning - 10a  . mirabegron ER  25 mg Oral Daily  . mirtazapine  7.5 mg Oral QHS  . oxyCODONE  10 mg Oral TID  . pantoprazole  40 mg Oral Daily   Continuous: . sodium chloride 75 mL/hr at 08/18/17 0149  . piperacillin-tazobactam (ZOSYN)  IV Stopped (08/18/17 2302)  . [START ON 08/20/2017] vancomycin     TVN:[id number] **OR** acetaminophen, ondansetron **OR** ondansetron (ZOFRAN) IV  Assesment: She was admitted with acute encephalopathy presumably related to infection.  She is better.  Cultures are pending.  She had acute kidney injury and I think she was dehydrated causing that.  Her basic metabolic profile was not back this morning. Principal  Problem:   Acute encephalopathy Active Problems:   DM type 2 (diabetes mellitus, type 2) (HCC)   Hyperlipidemia   Chronic kidney disease   Hypertension   Acute renal failure (HCC)   Encephalopathy acute   Altered mental status    Plan:no change.    LOS: 1 day   Craig Ionescu L 08/19/2017, 10:24 AM

## 2017-08-20 LAB — GLUCOSE, CAPILLARY
Glucose-Capillary: 184 mg/dL — ABNORMAL HIGH (ref 65–99)
Glucose-Capillary: 290 mg/dL — ABNORMAL HIGH (ref 65–99)
Glucose-Capillary: 191 mg/dL — ABNORMAL HIGH (ref 65–99)
Glucose-Capillary: 179 mg/dL — ABNORMAL HIGH (ref 65–99)

## 2017-08-20 LAB — BASIC METABOLIC PANEL
ANION GAP: 7 (ref 5–15)
BUN: 28 mg/dL — ABNORMAL HIGH (ref 6–20)
CALCIUM: 8.4 mg/dL — AB (ref 8.9–10.3)
CHLORIDE: 106 mmol/L (ref 101–111)
CO2: 25 mmol/L (ref 22–32)
CREATININE: 1.83 mg/dL — AB (ref 0.44–1.00)
GFR calc non Af Amer: 25 mL/min — ABNORMAL LOW (ref >60)
GFR, EST AFRICAN AMERICAN: 29 mL/min — AB (ref >60)
Glucose, Bld: 213 mg/dL — ABNORMAL HIGH (ref 65–99)
Potassium: 4.8 mmol/L (ref 3.5–5.1)
SODIUM: 138 mmol/L (ref 135–145)

## 2017-08-20 LAB — CBC
HCT: 33.1 % — ABNORMAL LOW (ref 36.0–46.0)
Hemoglobin: 9.6 g/dL — ABNORMAL LOW (ref 12.0–15.0)
MCH: 27.7 pg (ref 26.0–34.0)
MCHC: 29 g/dL — AB (ref 30.0–36.0)
MCV: 95.7 fL (ref 78.0–100.0)
PLATELETS: 132 10*3/uL — AB (ref 150–400)
RBC: 3.46 MIL/uL — ABNORMAL LOW (ref 3.87–5.11)
RDW: 14.9 % (ref 11.5–15.5)
WBC: 5.5 10*3/uL (ref 4.0–10.5)

## 2017-08-20 MED ORDER — FUROSEMIDE 40 MG PO TABS
40.0000 mg | ORAL_TABLET | Freq: Once | ORAL | Status: AC
Start: 2017-08-20 — End: 2017-08-20
  Administered 2017-08-20: 40 mg via ORAL
  Filled 2017-08-20: qty 1

## 2017-08-20 MED ORDER — AMOXICILLIN-POT CLAVULANATE 500-125 MG PO TABS
1.0000 | ORAL_TABLET | Freq: Two times a day (BID) | ORAL | Status: DC
  Administered 2017-08-20 – 2017-08-21 (×2): 500 mg via ORAL
  Filled 2017-08-20 (×2): qty 1

## 2017-08-20 MED ORDER — AMOXICILLIN-POT CLAVULANATE 875-125 MG PO TABS
1.0000 | ORAL_TABLET | Freq: Two times a day (BID) | ORAL | Status: DC
  Administered 2017-08-20: 1 via ORAL
  Filled 2017-08-20: qty 1

## 2017-08-20 MED ORDER — INSULIN GLARGINE 100 UNIT/ML ~~LOC~~ SOLN
25.0000 [IU] | Freq: Every day | SUBCUTANEOUS | Status: DC
  Administered 2017-08-20 – 2017-08-21 (×2): 25 [IU] via SUBCUTANEOUS
  Filled 2017-08-20 (×3): qty 0.25

## 2017-08-20 MED ORDER — BACITRACIN-NEOMYCIN-POLYMYXIN OINTMENT TUBE
TOPICAL_OINTMENT | Freq: Every day | CUTANEOUS | Status: DC
  Administered 2017-08-20 – 2017-08-21 (×2): via TOPICAL
  Filled 2017-08-20 (×2): qty 14.17

## 2017-08-20 MED ORDER — HYDROGEN PEROXIDE 3 % EX SOLN
Freq: Every day | CUTANEOUS | Status: DC
  Administered 2017-08-20 – 2017-08-21 (×2): via TOPICAL
  Filled 2017-08-20: qty 473

## 2017-08-20 NOTE — Progress Notes (Signed)
Pharmacy Renal Dosing Adjustment  Drug: Augmentin Original dose: 875mg  bid Scr: 1.83 CrClest~ 55mL/min New dose: Augmentin 500mg  bid  Thank Luciana Axe, [facility] 08/20/17 1439

## 2017-08-20 NOTE — Progress Notes (Signed)
Inpatient Diabetes Program Recommendations  AACE/ADA: New Consensus Statement on Inpatient Glycemic Control (2019)  Target Ranges:  Prepandial:   less than 140 mg/dL      Peak postprandial:   less than 180 mg/dL (1-2 hours)      Critically ill patients:  140 - 180 mg/dL    Results for [name], [name] (MRN [id number]) as of 08/20/2017 07:14  Ref. Range 08/19/2017 07:52 08/19/2017 11:27 08/19/2017 16:18 08/19/2017 22:33  Glucose-Capillary Latest Ref Range: 65 - 99 mg/dL 110 (H) 269 (H)  Novolog 8 units 230 (H)  Novolog 5 units 282 (H)  Novolog 3 units  Results for [name], [name] (MRN [id number]) as of 08/20/2017 07:14  Ref. Range 08/18/2017 07:44 08/18/2017 11:54 08/18/2017 16:59 08/18/2017 23:23  Glucose-Capillary Latest Ref Range: 65 - 99 mg/dL 151 (H) 182 (H) 160 (H) 153 (H)  Results for [name], [name] (MRN [id number]) as of 08/20/2017 07:14  Ref. Range 07/25/2017 15:58  Hemoglobin A1C Latest Ref Range: 4.8 - 5.6 % 9.1 (H)   Review of Glycemic Control  Diabetes history: DM2 Outpatient Diabetes medications: Tresiba 70 units QAM, Glipizide 5 mg QAM Current orders for Inpatient glycemic control: Tresiba 35 units QAM, Novolog 0-15 units TID with meals, Novolog 0-5 units QHS  Inpatient Diabetes Program Recommendations:  Insulin - Basal: Patient is ordered Tresiba 35 units QAM but NOTresiba given since admitted due to medication not on formulary. Please consider discontinuing Tresiba.  Insulin-Meal Coverage: Please consider ordering Novolog 5 units TID with meals for meal coverage if patient eats at least 50% of meals. HgbA1C: A1C 9.1% on 07/25/2017 indicating an average glucose of 215 mg/dl.   Thanks, Barnie Alderman, RN, MSN, CDE Diabetes Coordinator Inpatient Diabetes Program [phone] (Team Pager from 8am to 5pm)

## 2017-08-20 NOTE — Progress Notes (Signed)
Subjective: She feels about the same.  No new complaints.  No nausea vomiting.  No fever or chills.  Objective: Vital signs in last 24 hours: Temp:  [97.8 F (36.6 C)-98.6 F (37 C)] 97.8 F (36.6 C) (06/24 0646) Pulse Rate:  [68-70] 68 (06/24 0646) Resp:  [18-19] 18 (06/24 0646) BP: (97-118)/(60-77) 116/60 (06/24 0646) SpO2:  [93 %-95 %] 95 % (06/24 0646) Weight:  [96 kg (211 lb 10.3 oz)] 96 kg (211 lb 10.3 oz) (06/24 0646) Weight change: 2 kg (4 lb 6.6 oz)    Intake/Output from previous day: 06/23 0701 - 06/24 0700 In: 2100 [P.O.:1200; I.V.:600; IV Piggyback:300] Out: 2200 [Urine:2200]  PHYSICAL EXAM General appearance: alert, cooperative and no distress Resp: clear to auscultation bilaterally Cardio: regular rate and rhythm, S1, S2 normal, no murmur, click, rub or gallop GI: soft, non-tender; bowel sounds normal; no masses,  no organomegaly Extremities: extremities normal, atraumatic, no cyanosis or edema  Lab Results:  Results for orders placed or performed during the hospital encounter of 08/17/17 (from the past 48 hour(s))  Glucose, capillary     Status: Abnormal   Collection Time: 08/18/17 11:54 AM  Result Value Ref Range   Glucose-Capillary 182 (H) 65 - 99 mg/dL  Glucose, capillary     Status: Abnormal   Collection Time: 08/18/17  4:59 PM  Result Value Ref Range   Glucose-Capillary 160 (H) 65 - 99 mg/dL  Glucose, capillary     Status: Abnormal   Collection Time: 08/18/17 11:23 PM  Result Value Ref Range   Glucose-Capillary 153 (H) 65 - 99 mg/dL  Glucose, capillary     Status: Abnormal   Collection Time: 08/19/17  7:52 AM  Result Value Ref Range   Glucose-Capillary 110 (H) 65 - 99 mg/dL  Basic metabolic panel     Status: Abnormal   Collection Time: 08/19/17 11:26 AM  Result Value Ref Range   Sodium 138 135 - 145 mmol/L   Potassium 4.5 3.5 - 5.1 mmol/L   Chloride 106 101 - 111 mmol/L   CO2 24 22 - 32 mmol/L   Glucose, Bld 297 (H) 65 - 99 mg/dL   BUN 31  (H) 6 - 20 mg/dL   Creatinine, Ser 1.71 (H) 0.44 - 1.00 mg/dL   Calcium 8.3 (L) 8.9 - 10.3 mg/dL   GFR calc non Af Amer 27 (L) >60 mL/min   GFR calc Af Amer 31 (L) >60 mL/min    Comment: (NOTE) The eGFR has been calculated using the CKD EPI equation. This calculation has not been validated in all clinical situations. eGFR's persistently <60 mL/min signify possible Chronic Kidney Disease.    Anion gap 8 5 - 15    Comment: Performed at [facility], [address], [place], [place] [address]  Glucose, capillary     Status: Abnormal   Collection Time: 08/19/17 11:27 AM  Result Value Ref Range   Glucose-Capillary 269 (H) 65 - 99 mg/dL  Glucose, capillary     Status: Abnormal   Collection Time: 08/19/17  4:18 PM  Result Value Ref Range   Glucose-Capillary 230 (H) 65 - 99 mg/dL  Glucose, capillary     Status: Abnormal   Collection Time: 08/19/17 10:33 PM  Result Value Ref Range   Glucose-Capillary 282 (H) 65 - 99 mg/dL  Basic metabolic panel     Status: Abnormal   Collection Time: 08/20/17  4:21 AM  Result Value Ref Range   Sodium 138 135 - 145 mmol/L  Potassium 4.8 3.5 - 5.1 mmol/L   Chloride 106 101 - 111 mmol/L   CO2 25 22 - 32 mmol/L   Glucose, Bld 213 (H) 65 - 99 mg/dL   BUN 28 (H) 6 - 20 mg/dL   Creatinine, Ser 1.83 (H) 0.44 - 1.00 mg/dL   Calcium 8.4 (L) 8.9 - 10.3 mg/dL   GFR calc non Af Amer 25 (L) >60 mL/min   GFR calc Af Amer 29 (L) >60 mL/min    Comment: (NOTE) The eGFR has been calculated using the CKD EPI equation. This calculation has not been validated in all clinical situations. eGFR's persistently <60 mL/min signify possible Chronic Kidney Disease.    Anion gap 7 5 - 15    Comment: Performed at [facility], [address], [place], [place] [address]  CBC     Status: Abnormal   Collection Time: 08/20/17  4:21 AM  Result Value Ref Range   WBC 5.5 4.0 - 10.5 K/uL   RBC 3.46 (L) 3.87 - 5.11 MIL/uL   Hemoglobin 9.6 (L) 12.0 - 15.0 g/dL   HCT 33.1 (L) 36.0 -  46.0 %   MCV 95.7 78.0 - 100.0 fL   MCH 27.7 26.0 - 34.0 pg   MCHC 29.0 (L) 30.0 - 36.0 g/dL   RDW 14.9 11.5 - 15.5 %   Platelets 132 (L) 150 - 400 K/uL    Comment: Performed at [facility], [address], [place], [place] [address]  Glucose, capillary     Status: Abnormal   Collection Time: 08/20/17  7:39 AM  Result Value Ref Range   Glucose-Capillary 184 (H) 65 - 99 mg/dL    ABGS No results for input(s): PHART, PO2ART, TCO2, HCO3 in the last 72 hours.  Invalid input(s): PCO2 CULTURES Recent Results (from the past 240 hour(s))  Culture, blood (Routine X 2) w Reflex to ID Panel     Status: None (Preliminary result)   Collection Time: 08/17/17  8:11 PM  Result Value Ref Range Status   Specimen Description BLOOD RIGHT ARM  Final   Special Requests   Final    BOTTLES DRAWN AEROBIC AND ANAEROBIC Blood Culture adequate volume DRAWN BY RN   Culture   Final    NO GROWTH 3 DAYS Performed at [facility], [address], [place], [place] [address]    Report Status PENDING  Incomplete  Culture, blood (Routine X 2) w Reflex to ID Panel     Status: None (Preliminary result)   Collection Time: 08/17/17  8:15 PM  Result Value Ref Range Status   Specimen Description RIGHT ANTECUBITAL  Final   Special Requests   Final    BOTTLES DRAWN AEROBIC AND ANAEROBIC Blood Culture adequate volume   Culture   Final    NO GROWTH 3 DAYS Performed at [facility], [address] [place], [place] [address]    Report Status PENDING  Incomplete  Urine Culture     Status: None   Collection Time: 08/17/17  9:05 PM  Result Value Ref Range Status   Specimen Description   Final    URINE, CLEAN CATCH Performed at [facility], [address] [place], [place] [address]    Special Requests   Final    NONE Performed at [facility], [address], [place], [place] [address]    Culture   Final    NO GROWTH Performed at [facility], [place] [address], [place], [place] [address]    Report  Status  08/19/2017 FINAL  Final   Studies/Results: No results found.  Medications:  Prior to Admission:  Medications Prior to Admission  Medication Sig Dispense Refill Last Dose  . ALPHAGAN P 0.1 % SOLN Place 1 drop into both eyes 2 (two) times daily.    07/25/2017 at Unknown time  . amLODipine (NORVASC) 5 MG tablet Take 5 mg by mouth every morning.    07/25/2017 at Unknown time  . aspirin EC 81 MG tablet Take 81 mg by mouth daily.   07/25/2017 at Unknown time  . atorvastatin (LIPITOR) 10 MG tablet Take 10 mg by mouth every evening.    07/24/2017 at Unknown time  . Bacillus Coagulans-Inulin (PROBIOTIC FORMULA PO) Take 1 capsule by mouth daily.   07/25/2017 at Unknown time  . cyanocobalamin (,VITAMIN B-12,) 1000 MCG/ML injection Inject 1,000 mcg into the muscle every 30 (thirty) days.    Past Month at Unknown time  . dorzolamide (TRUSOPT) 2 % ophthalmic solution PLACE ONE DROP IN [facility] EYE TWICE DAILY  4 07/25/2017 at Unknown time  . EPIPEN 2-PAK 0.3 MG/0.3ML SOAJ injection Inject 0.3 mg into the muscle once.    UNKNOWN  . gabapentin (NEURONTIN) 600 MG tablet Take 600 mg by mouth at bedtime.    07/24/2017 at Unknown time  . glipiZIDE (GLUCOTROL) 5 MG tablet Take 5 mg by mouth daily before breakfast.    07/25/2017 at Unknown time  . hydrALAZINE (APRESOLINE) 25 MG tablet Take 25-50 mg by mouth See admin instructions. 57m in the morning and 211mat bedtime   07/25/2017 at Unknown time  . Insulin Degludec (TRESIBA FLEXTOUCH [place]) Inject into the skin.     . [name] Kitchenrbesartan (AVAPRO) 150 MG tablet Take 150 mg by mouth daily.   07/25/2017 at Unknown time  . latanoprost (XALATAN) 0.005 % ophthalmic solution Place 1 drop into both eyes at bedtime.    07/24/2017 at Unknown time  . metoprolol succinate (TOPROL-XL) 100 MG 24 hr tablet Take 100 mg by mouth every morning. Take with or immediately following a meal.   07/25/2017 at [place]. mirtazapine (REMERON) 15 MG tablet Take by mouth at bedtime.    07/24/2017 at Unknown time  .  MYRBETRIQ 25 MG TB24 tablet Take 25 mg by mouth daily.    07/25/2017 at Unknown time  . omeprazole (PRILOSEC) 20 MG capsule Take 20 mg by mouth daily.   07/25/2017 at Unknown time  . Oxycodone HCl 10 MG TABS Take 10 mg by mouth 3 (three) times daily.    07/25/2017 at Unknown time  . torsemide (DEMADEX) 20 MG tablet Take 20 mg by mouth 2 (two) times daily.    07/25/2017 at Unknown time   Scheduled: . acidophilus   Oral Daily  . amLODipine  5 mg Oral q morning - 10a  . amoxicillin-clavulanate  1 tablet Oral Q12H  . anastrozole  1 mg Oral Daily  . aspirin EC  81 mg Oral Daily  . atorvastatin  10 mg Oral QPM  . brimonidine  1 drop Both Eyes BID  . dorzolamide  1 drop Both Eyes BID  . gabapentin  600 mg Oral QHS  . heparin  5,000 Units Subcutaneous Q8H  . hydrALAZINE  25 mg Oral QHS  . hydrALAZINE  50 mg Oral q morning - 10a  . insulin aspart  0-15 Units Subcutaneous TID WC  . insulin aspart  0-5 Units Subcutaneous QHS  . insulin glargine  25 Units Subcutaneous Daily  . latanoprost  1 drop Both Eyes QHS  . metoprolol succinate  100 mg Oral q morning - 10a  . mirabegron ER  25 mg Oral Daily  . mirtazapine  7.5 mg Oral QHS  . oxyCODONE  10 mg Oral TID  . pantoprazole  40 mg Oral Daily   Continuous: . sodium chloride 75 mL/hr at 08/19/17 2054   TMY:TRZNBVAPOLIDC **OR** acetaminophen, ondansetron **OR** ondansetron (ZOFRAN) IV  Assesment: She was admitted with acute encephalopathy which is metabolic but is not quite clear what happened.  The last time she had this she had a urinary tract infection but her urine is negative.  Blood cultures are negative so far.  She had significant dehydration and acute on chronic renal failure which is better.  She has diabetes which is not controlled. Principal Problem:   Acute encephalopathy Active Problems:   DM type 2 (diabetes mellitus, type 2) (HCC)   Hyperlipidemia   Chronic kidney disease   Hypertension   Acute renal failure (HCC)    Encephalopathy acute   Altered mental status    Plan: Start Lantus.  Continue other treatments.  Switch her to oral antibiotics.  Assuming she does okay she should be able to go home tomorrow    LOS: 2 days   Shaddai Shapley L 08/20/2017, 8:41 AM

## 2017-08-20 NOTE — Care Management Important Message (Signed)
Important Message  Patient Details  Name: [name] MRN: [id number] Date of Birth: Mar 05, 1933   Medicare Important Message Given:  Yes    Shelda Altes 08/20/2017, 12:29 PM

## 2017-08-21 LAB — BASIC METABOLIC PANEL
ANION GAP: 8 (ref 5–15)
BUN: 24 mg/dL — ABNORMAL HIGH (ref 8–23)
CHLORIDE: 103 mmol/L (ref 98–111)
CO2: 28 mmol/L (ref 22–32)
Calcium: 9.2 mg/dL (ref 8.9–10.3)
Creatinine, Ser: 1.67 mg/dL — ABNORMAL HIGH (ref 0.44–1.00)
GFR calc non Af Amer: 28 mL/min — ABNORMAL LOW (ref >60)
GFR, EST AFRICAN AMERICAN: 32 mL/min — AB (ref >60)
Glucose, Bld: 128 mg/dL — ABNORMAL HIGH (ref 70–99)
Potassium: 4.4 mmol/L (ref 3.5–5.1)
Sodium: 139 mmol/L (ref 135–145)

## 2017-08-21 LAB — GLUCOSE, CAPILLARY: Glucose-Capillary: 116 mg/dL — ABNORMAL HIGH (ref 70–99)

## 2017-08-21 MED ORDER — FUROSEMIDE 40 MG PO TABS
40.0000 mg | ORAL_TABLET | Freq: Once | ORAL | Status: AC
  Administered 2017-08-21: 40 mg via ORAL
  Filled 2017-08-21: qty 1

## 2017-08-21 MED ORDER — TORSEMIDE 20 MG PO TABS: 20.0000 mg | ORAL_TABLET | Freq: Every day | ORAL | 5 refills | Status: AC

## 2017-08-21 MED ORDER — AMOXICILLIN-POT CLAVULANATE 500-125 MG PO TABS: 1.0000 | ORAL_TABLET | Freq: Two times a day (BID) | ORAL | 0 refills | Status: DC

## 2017-08-21 NOTE — Care Management Note (Signed)
Case Management Note  Patient Details  Name: [name] MRN: [id number] Date of Birth: 1933/06/08  Subjective/Objective:         Admitted with encephalopathy secondary to UTI. Pt alert and oriented at time of assessment on 08/20/17. Pt is ind with ADL's. She has insurance with drug coverage and transportation. She has supportive family. She has had HH in the past through [facility]. Pt has RW she uses with ambulation pta.            Action/Plan: DC home today with HH PT. Pt has requested [facility], aware they have 48 hrs to make first visit. Juliann Pulse, [facility] rep, given referral.   Expected Discharge Date:  08/21/17               Expected Discharge Plan:  [place]  In-House Referral:  NA  Discharge planning Services  CM Consult  Post Acute Care Choice:  Home Health Choice offered to:  Patient  HH Arranged:  PT [place]:  [place]  Status of Service:  Completed, signed off  If discussed at [place] of Stay Meetings, dates discussed:    Additional Comments:  Sherald Barge, RN 08/21/2017, 9:03 AM

## 2017-08-21 NOTE — Discharge Summary (Signed)
Physician Discharge Summary  Patient ID: [name] MRN: [id number] DOB/AGE: 82-Dec-1935 82 y.o. Primary Care Physician:Dorse Locy, Percell Miller, MD Admit date: 08/17/2017 Discharge date: 08/21/2017    Discharge Diagnoses:   Principal Problem:   Acute encephalopathy Active Problems:   DM type 2 (diabetes mellitus, type 2) (HCC)   Hyperlipidemia   Chronic kidney disease   Hypertension   Acute renal failure (HCC)   Encephalopathy acute   Altered mental status   Allergies as of 08/21/2017      Reactions   Bee Venom Anaphylaxis   Hydromorphone Hcl    Other reaction(s): Agitation, Confusion   Demerol Nausea Only   Other Other (See Comments)   Bee stings-swelling      Medication List    TAKE these medications   ALPHAGAN P 0.1 % Soln Generic drug:  brimonidine Place 1 drop into both eyes 2 (two) times daily.   amLODipine 5 MG tablet Commonly known as:  NORVASC Take 5 mg by mouth every morning.   aspirin EC 81 MG tablet Take 81 mg by mouth daily.   atorvastatin 10 MG tablet Commonly known as:  LIPITOR Take 10 mg by mouth every evening.   cyanocobalamin 1000 MCG/ML injection Commonly known as:  (VITAMIN B-12) Inject 1,000 mcg into the muscle every 30 (thirty) days.   dorzolamide 2 % ophthalmic solution Commonly known as:  TRUSOPT PLACE ONE DROP IN EACH EYE TWICE DAILY   EPIPEN 2-PAK 0.3 mg/0.3 mL [place] injection Generic drug:  EPINEPHrine Inject 0.3 mg into the muscle once.   gabapentin 600 MG tablet Commonly known as:  NEURONTIN Take 600 mg by mouth at bedtime.   glipiZIDE 5 MG tablet Commonly known as:  GLUCOTROL Take 5 mg by mouth daily before breakfast.   hydrALAZINE 25 MG tablet Commonly known as:  APRESOLINE Take 25-50 mg by mouth See admin instructions. 50mg  in the morning and 25mg  at bedtime   irbesartan 150 MG tablet Commonly known as:  AVAPRO Take 150 mg by mouth daily.   latanoprost 0.005 % ophthalmic solution Commonly known as:   XALATAN Place 1 drop into both eyes at bedtime.   metoprolol succinate 100 MG 24 hr tablet Commonly known as:  TOPROL-XL Take 100 mg by mouth every morning. Take with or immediately following a meal.   mirtazapine 15 MG tablet Commonly known as:  REMERON Take by mouth at bedtime.   MYRBETRIQ 25 MG Tb24 tablet Generic drug:  mirabegron ER Take 25 mg by mouth daily.   omeprazole 20 MG capsule Commonly known as:  PRILOSEC Take 20 mg by mouth daily.   Oxycodone HCl 10 MG Tabs Take 10 mg by mouth 3 (three) times daily.   PROBIOTIC FORMULA PO Take 1 capsule by mouth daily.   torsemide 20 MG tablet Commonly known as:  DEMADEX Take 1 tablet (20 mg total) by mouth daily. What changed:  when to take this   TRESIBA FLEXTOUCH [place] Inject into the skin.       Discharged Condition: Improved    Consults: None  Significant Diagnostic Studies: Dg Chest 2 View  Result Date: 08/17/2017 CLINICAL DATA:  altered mental status, shaking all over and slight confusion with weakness, pt stated onset today. History of HTN, DM, CKD, cirrhosis, skin and breast cancer, lumpectomy. EXAM: CHEST - 2 VIEW COMPARISON:  07/25/2017 FINDINGS: Cardiac silhouette is normal in size. No mediastinal or hilar masses. No evidence of adenopathy. Clear lungs.  No pleural effusion or pneumothorax. Skeletal structures are demineralized  but grossly intact. IMPRESSION: No active cardiopulmonary disease. Electronically Signed   By: Lajean Manes M.D.   On: 08/17/2017 19:54   Dg Chest 2 View  Result Date: 07/25/2017 CLINICAL DATA:  Nonproductive cough EXAM: CHEST - 2 VIEW COMPARISON:  04/28/2016 FINDINGS: Normal heart size. Lungs are under aerated with bibasilar atelectasis. No pneumothorax or pleural effusion. Lower thoracic compression deformity with kyphosis is unchanged. IMPRESSION: No active cardiopulmonary disease. Electronically Signed   By: Marybelle Killings M.D.   On: 07/25/2017 17:34    Lab Results: Basic  Metabolic Panel: Recent Labs    08/20/17 0421 08/21/17 0623  NA 138 139  K 4.8 4.4  CL 106 103  CO2 25 28  GLUCOSE 213* 128*  BUN 28* 24*  CREATININE 1.83* 1.67*  CALCIUM 8.4* 9.2   Liver Function Tests: No results for input(s): AST, ALT, ALKPHOS, BILITOT, PROT, ALBUMIN in the last 72 hours.   CBC: Recent Labs    08/20/17 0421  WBC 5.5  HGB 9.6*  HCT 33.1*  MCV 95.7  PLT 132*    Recent Results (from the past 240 hour(s))  Culture, blood (Routine X 2) w Reflex to ID Panel     Status: None (Preliminary result)   Collection Time: 08/17/17  8:11 PM  Result Value Ref Range Status   Specimen Description BLOOD RIGHT ARM  Final   Special Requests   Final    BOTTLES DRAWN AEROBIC AND ANAEROBIC Blood Culture adequate volume DRAWN BY RN   Culture   Final    NO GROWTH 4 DAYS Performed at [facility], [address] [place], [place] [address]    Report Status PENDING  Incomplete  Culture, blood (Routine X 2) w Reflex to ID Panel     Status: None (Preliminary result)   Collection Time: 08/17/17  8:15 PM  Result Value Ref Range Status   Specimen Description RIGHT ANTECUBITAL  Final   Special Requests   Final    BOTTLES DRAWN AEROBIC AND ANAEROBIC Blood Culture adequate volume   Culture   Final    NO GROWTH 4 DAYS Performed at [facility], [address] [place], [place] [address]    Report Status PENDING  Incomplete  Urine Culture     Status: None   Collection Time: 08/17/17  9:05 PM  Result Value Ref Range Status   Specimen Description   Final    URINE, CLEAN CATCH Performed at [facility], [address] [place], [place] [address]    Special Requests   Final    NONE Performed at [facility], [address] [place], [place] [address]    Culture   Final    NO GROWTH Performed at [facility], [place] [address] [place], [place] [address]    Report Status 08/19/2017 FINAL  Final     Hospital Course: This is an 84 year old who came to the hospital because  of altered mental status.  Her family had seen her about 3 hours prior to the change and she was normal had eaten her lunch and had no complaints.  She then became confused.  She had a similar episode about 3 weeks ago that was associated with a urinary tract infection.  This time she did not have a urinary tract infection but was treated with antibiotics because of concerns that it may have been associated with some sort of infection.  She was noted to be dehydrated and had acute on chronic renal failure.  Her  renal function improved she was rehydrated but had trouble with swelling of her legs and had to have Lasix.  By the time of discharge she was back at baseline her mental status was improved and she is being discharged home in good condition to have home health follow with [place].  Discharge Exam: Blood pressure 130/69, pulse 70, temperature 98.9 F (37.2 C), resp. rate 18, height 5' (1.524 m), weight 94.9 kg (209 lb 3.5 oz), SpO2 96 %. She is awake and alert.  Chest is clear.  1+ edema of her legs.  Disposition: Home with home health services.  She will also have 5 days of Augmentin 500 mg twice daily  Discharge Instructions    Face-to-face encounter (required for Medicare/Medicaid patients)   Complete by:  As directed    I Desira Alessandrini L certify that this patient is under my care and that I, or a nurse practitioner or physician's assistant working with me, had a face-to-face encounter that meets the physician face-to-face encounter requirements with this patient on 08/21/2017. The encounter with the patient was in whole, or in part for the following medical condition(s) which is the primary reason for home health care (List medical condition): Altered mental status/dehydration   The encounter with the patient was in whole, or in part, for the following medical condition, which is the primary reason for home health care:  Altered mental status/dehydration   I certify that, based on my findings, the  following services are medically necessary home health services:   Nursing Physical therapy     Reason for Medically Necessary Home Health Services:  Skilled Nursing- Change/Decline in Patient Status   My clinical findings support the need for the above services:  Unable to leave home safely without assistance and/or assistive device   Further, I certify that my clinical findings support that this patient is homebound due to:  Unable to leave home safely without assistance   Home Health   Complete by:  As directed    To provide the following care/treatments:   PT RN     Check basic metabolic profile on 7/34/2876.  She needs instructions regarding heart failure/fluid overload        Signed: Caeden Foots L   08/21/2017, 9:00 AM

## 2017-08-21 NOTE — Progress Notes (Signed)
Subjective: She feels better.  She wants to go home.  She had swelling of her legs yesterday and I think that is largely because she is been off her diuretics because she was dehydrated.  She has had response to IV Lasix.  Objective: Vital signs in last 24 hours: Temp:  [98.5 F (36.9 C)-98.9 F (37.2 C)] 98.9 F (37.2 C) (06/25 0700) Pulse Rate:  [67-72] 70 (06/25 0700) Resp:  [16-20] 18 (06/25 0700) BP: (130-139)/(54-69) 130/69 (06/25 0700) SpO2:  [96 %-98 %] 96 % (06/25 0700) Weight:  [94.9 kg (209 lb 3.5 oz)] 94.9 kg (209 lb 3.5 oz) (06/25 0700) Weight change: -1.1 kg (-2 lb 6.8 oz) Last BM Date: 08/19/17  Intake/Output from previous day: 06/24 0701 - 06/25 0700 In: 2985 [P.O.:960; I.V.:2025] Out: 2600 [Urine:2600]  PHYSICAL EXAM General appearance: alert, cooperative and no distress Resp: clear to auscultation bilaterally Cardio: regular rate and rhythm, S1, S2 normal, no murmur, click, rub or gallop GI: soft, non-tender; bowel sounds normal; no masses,  no organomegaly Extremities: She has 1+ edema bilaterally  Lab Results:  Results for orders placed or performed during the hospital encounter of 08/17/17 (from the past 48 hour(s))  Basic metabolic panel     Status: Abnormal   Collection Time: 08/19/17 11:26 AM  Result Value Ref Range   Sodium 138 135 - 145 mmol/L   Potassium 4.5 3.5 - 5.1 mmol/L   Chloride 106 101 - 111 mmol/L   CO2 24 22 - 32 mmol/L   Glucose, Bld 297 (H) 65 - 99 mg/dL   BUN 31 (H) 6 - 20 mg/dL   Creatinine, Ser 1.71 (H) 0.44 - 1.00 mg/dL   Calcium 8.3 (L) 8.9 - 10.3 mg/dL   GFR calc non Af Amer 27 (L) >60 mL/min   GFR calc Af Amer 31 (L) >60 mL/min    Comment: (NOTE) The eGFR has been calculated using the CKD EPI equation. This calculation has not been validated in all clinical situations. eGFR's persistently <60 mL/min signify possible Chronic Kidney Disease.    Anion gap 8 5 - 15    Comment: Performed at [facility], [address],  [place], [place] [address]  Glucose, capillary     Status: Abnormal   Collection Time: 08/19/17 11:27 AM  Result Value Ref Range   Glucose-Capillary 269 (H) 65 - 99 mg/dL  Glucose, capillary     Status: Abnormal   Collection Time: 08/19/17  4:18 PM  Result Value Ref Range   Glucose-Capillary 230 (H) 65 - 99 mg/dL  Glucose, capillary     Status: Abnormal   Collection Time: 08/19/17 10:33 PM  Result Value Ref Range   Glucose-Capillary 282 (H) 65 - 99 mg/dL  Basic metabolic panel     Status: Abnormal   Collection Time: 08/20/17  4:21 AM  Result Value Ref Range   Sodium 138 135 - 145 mmol/L   Potassium 4.8 3.5 - 5.1 mmol/L   Chloride 106 101 - 111 mmol/L   CO2 25 22 - 32 mmol/L   Glucose, Bld 213 (H) 65 - 99 mg/dL   BUN 28 (H) 6 - 20 mg/dL   Creatinine, Ser 1.83 (H) 0.44 - 1.00 mg/dL   Calcium 8.4 (L) 8.9 - 10.3 mg/dL   GFR calc non Af Amer 25 (L) >60 mL/min   GFR calc Af Amer 29 (L) >60 mL/min    Comment: (NOTE) The eGFR has been calculated using the CKD EPI equation. This calculation has  not been validated in all clinical situations. eGFR's persistently <60 mL/min signify possible Chronic Kidney Disease.    Anion gap 7 5 - 15    Comment: Performed at [facility], [address] [place], [place] [address]  CBC     Status: Abnormal   Collection Time: 08/20/17  4:21 AM  Result Value Ref Range   WBC 5.5 4.0 - 10.5 K/uL   RBC 3.46 (L) 3.87 - 5.11 MIL/uL   Hemoglobin 9.6 (L) 12.0 - 15.0 g/dL   HCT 33.1 (L) 36.0 - 46.0 %   MCV 95.7 78.0 - 100.0 fL   MCH 27.7 26.0 - 34.0 pg   MCHC 29.0 (L) 30.0 - 36.0 g/dL   RDW 14.9 11.5 - 15.5 %   Platelets 132 (L) 150 - 400 K/uL    Comment: Performed at [facility], [address], [place], [place] [address]  Glucose, capillary     Status: Abnormal   Collection Time: 08/20/17  7:39 AM  Result Value Ref Range   Glucose-Capillary 184 (H) 65 - 99 mg/dL  Glucose, capillary     Status: Abnormal   Collection Time: 08/20/17 11:28 AM  Result Value  Ref Range   Glucose-Capillary 290 (H) 65 - 99 mg/dL  Glucose, capillary     Status: Abnormal   Collection Time: 08/20/17  4:27 PM  Result Value Ref Range   Glucose-Capillary 191 (H) 65 - 99 mg/dL  Glucose, capillary     Status: Abnormal   Collection Time: 08/20/17  9:10 PM  Result Value Ref Range   Glucose-Capillary 179 (H) 65 - 99 mg/dL  Basic metabolic panel     Status: Abnormal   Collection Time: 08/21/17  6:23 AM  Result Value Ref Range   Sodium 139 135 - 145 mmol/L   Potassium 4.4 3.5 - 5.1 mmol/L   Chloride 103 98 - 111 mmol/L   CO2 28 22 - 32 mmol/L   Glucose, Bld 128 (H) 70 - 99 mg/dL   BUN 24 (H) 8 - 23 mg/dL   Creatinine, Ser 1.67 (H) 0.44 - 1.00 mg/dL   Calcium 9.2 8.9 - 10.3 mg/dL   GFR calc non Af Amer 28 (L) >60 mL/min   GFR calc Af Amer 32 (L) >60 mL/min    Comment: (NOTE) The eGFR has been calculated using the CKD EPI equation. This calculation has not been validated in all clinical situations. eGFR's persistently <60 mL/min signify possible Chronic Kidney Disease.    Anion gap 8 5 - 15    Comment: Performed at [facility], [address] [place], [place] [address]  Glucose, capillary     Status: Abnormal   Collection Time: 08/21/17  8:11 AM  Result Value Ref Range   Glucose-Capillary 116 (H) 70 - 99 mg/dL    ABGS No results for input(s): PHART, PO2ART, TCO2, HCO3 in the last 72 hours.  Invalid input(s): PCO2 CULTURES Recent Results (from the past 240 hour(s))  Culture, blood (Routine X 2) w Reflex to ID Panel     Status: None (Preliminary result)   Collection Time: 08/17/17  8:11 PM  Result Value Ref Range Status   Specimen Description BLOOD RIGHT ARM  Final   Special Requests   Final    BOTTLES DRAWN AEROBIC AND ANAEROBIC Blood Culture adequate volume DRAWN BY RN   Culture   Final    NO GROWTH 4 DAYS Performed at [facility], [address] [place], [place] [address]    Report  Status PENDING  Incomplete  Culture, blood (Routine X 2) w Reflex  to ID Panel     Status: None (Preliminary result)   Collection Time: 08/17/17  8:15 PM  Result Value Ref Range Status   Specimen Description RIGHT ANTECUBITAL  Final   Special Requests   Final    BOTTLES DRAWN AEROBIC AND ANAEROBIC Blood Culture adequate volume   Culture   Final    NO GROWTH 4 DAYS Performed at [facility], [address] [place], [place] [address]    Report Status PENDING  Incomplete  Urine Culture     Status: None   Collection Time: 08/17/17  9:05 PM  Result Value Ref Range Status   Specimen Description   Final    URINE, CLEAN CATCH Performed at [facility], [address], [place], [place] [address]    Special Requests   Final    NONE Performed at [facility], [address], [place], [place] [address]    Culture   Final    NO GROWTH Performed at [facility], [place] [address], [place], [place] [address]    Report Status 08/19/2017 FINAL  Final   Studies/Results: No results found.  Medications:  Prior to Admission:  Medications Prior to Admission  Medication Sig Dispense Refill Last Dose  . ALPHAGAN P 0.1 % SOLN Place 1 drop into both eyes 2 (two) times daily.    07/25/2017 at Unknown time  . amLODipine (NORVASC) 5 MG tablet Take 5 mg by mouth every morning.    07/25/2017 at Unknown time  . aspirin EC 81 MG tablet Take 81 mg by mouth daily.   07/25/2017 at Unknown time  . atorvastatin (LIPITOR) 10 MG tablet Take 10 mg by mouth every evening.    07/24/2017 at Unknown time  . Bacillus Coagulans-Inulin (PROBIOTIC FORMULA PO) Take 1 capsule by mouth daily.   07/25/2017 at Unknown time  . cyanocobalamin (,VITAMIN B-12,) 1000 MCG/ML injection Inject 1,000 mcg into the muscle every 30 (thirty) days.    Past Month at Unknown time  . dorzolamide (TRUSOPT) 2 % ophthalmic solution PLACE ONE DROP IN [facility] EYE TWICE DAILY  4 07/25/2017 at Unknown time  . EPIPEN 2-PAK 0.3 MG/0.3ML SOAJ injection Inject 0.3 mg into the muscle once.    UNKNOWN  . gabapentin (NEURONTIN)  600 MG tablet Take 600 mg by mouth at bedtime.    07/24/2017 at Unknown time  . glipiZIDE (GLUCOTROL) 5 MG tablet Take 5 mg by mouth daily before breakfast.    07/25/2017 at Unknown time  . hydrALAZINE (APRESOLINE) 25 MG tablet Take 25-50 mg by mouth See admin instructions. '50mg'$  in the morning and '25mg'$  at bedtime   07/25/2017 at Unknown time  . Insulin Degludec (TRESIBA FLEXTOUCH [place]) Inject into the skin.     [name] Kitchen irbesartan (AVAPRO) 150 MG tablet Take 150 mg by mouth daily.   07/25/2017 at Unknown time  . latanoprost (XALATAN) 0.005 % ophthalmic solution Place 1 drop into both eyes at bedtime.    07/24/2017 at Unknown time  . metoprolol succinate (TOPROL-XL) 100 MG 24 hr tablet Take 100 mg by mouth every morning. Take with or immediately following a meal.   07/25/2017 at [place]  . mirtazapine (REMERON) 15 MG tablet Take by mouth at bedtime.    07/24/2017 at Unknown time  . MYRBETRIQ 25 MG TB24 tablet Take 25 mg by mouth daily.    07/25/2017 at Unknown time  . omeprazole (PRILOSEC) 20 MG capsule  Take 20 mg by mouth daily.   07/25/2017 at Unknown time  . Oxycodone HCl 10 MG TABS Take 10 mg by mouth 3 (three) times daily.    07/25/2017 at Unknown time  . torsemide (DEMADEX) 20 MG tablet Take 20 mg by mouth 2 (two) times daily.    07/25/2017 at Unknown time   Scheduled: . acidophilus   Oral Daily  . amLODipine  5 mg Oral q morning - 10a  . amoxicillin-clavulanate  1 tablet Oral BID  . anastrozole  1 mg Oral Daily  . aspirin EC  81 mg Oral Daily  . atorvastatin  10 mg Oral QPM  . brimonidine  1 drop Both Eyes BID  . dorzolamide  1 drop Both Eyes BID  . gabapentin  600 mg Oral QHS  . heparin  5,000 Units Subcutaneous Q8H  . hydrALAZINE  25 mg Oral QHS  . hydrALAZINE  50 mg Oral q morning - 10a  . hydrogen peroxide   Topical Daily  . insulin aspart  0-15 Units Subcutaneous TID WC  . insulin aspart  0-5 Units Subcutaneous QHS  . insulin glargine  25 Units Subcutaneous Daily  . latanoprost  1 drop Both Eyes  QHS  . metoprolol succinate  100 mg Oral q morning - 10a  . mirabegron ER  25 mg Oral Daily  . mirtazapine  7.5 mg Oral QHS  . neomycin-bacitracin-polymyxin   Topical Daily  . oxyCODONE  10 mg Oral TID  . pantoprazole  40 mg Oral Daily   Continuous: . sodium chloride 10 mL/hr at 08/20/17 1810   BEE:[id number] **OR** acetaminophen, ondansetron **OR** ondansetron (ZOFRAN) IV  Assesment: She was admitted with acute encephalopathy likely related to dehydration and acute renal failure.  She has been on diuretics because of leg swelling and that will have to be adjusted.  Her encephalopathy is cleared.  Her renal function is better. Principal Problem:   Acute encephalopathy Active Problems:   DM type 2 (diabetes mellitus, type 2) (HCC)   Hyperlipidemia   Chronic kidney disease   Hypertension   Acute renal failure (HCC)   Encephalopathy acute   Altered mental status    Plan: Discharge home today with home health services    LOS: 3 days   Eleasha Cataldo L 08/21/2017, 8:53 AM

## 2017-08-21 NOTE — Progress Notes (Signed)
IV removed, 2x2 gauze and paper tape applied, to site, patient tolerated well.  Reviewed AVS with patient and patient's daughter both verbalized understanding.  Patient transported home by daughter.

## 2017-08-22 LAB — CULTURE, BLOOD (ROUTINE X 2)
Culture: NO GROWTH
Culture: NO GROWTH
Special Requests: ADEQUATE

## 2017-08-23 DIAGNOSIS — L570 Actinic keratosis: Secondary | ICD-10-CM | POA: Diagnosis not present

## 2017-08-23 DIAGNOSIS — N189 Chronic kidney disease, unspecified: Secondary | ICD-10-CM | POA: Diagnosis not present

## 2017-08-23 DIAGNOSIS — Z853 Personal history of malignant neoplasm of breast: Secondary | ICD-10-CM | POA: Diagnosis not present

## 2017-08-23 DIAGNOSIS — M549 Dorsalgia, unspecified: Secondary | ICD-10-CM | POA: Diagnosis not present

## 2017-08-23 DIAGNOSIS — E785 Hyperlipidemia, unspecified: Secondary | ICD-10-CM | POA: Diagnosis not present

## 2017-08-23 DIAGNOSIS — G8929 Other chronic pain: Secondary | ICD-10-CM | POA: Diagnosis not present

## 2017-08-23 DIAGNOSIS — E1122 Type 2 diabetes mellitus with diabetic chronic kidney disease: Secondary | ICD-10-CM | POA: Diagnosis not present

## 2017-08-23 DIAGNOSIS — I130 Hypertensive heart and chronic kidney disease with heart failure and stage 1 through stage 4 chronic kidney disease, or unspecified chronic kidney disease: Secondary | ICD-10-CM | POA: Diagnosis not present

## 2017-08-23 DIAGNOSIS — I5032 Chronic diastolic (congestive) heart failure: Secondary | ICD-10-CM | POA: Diagnosis not present

## 2017-08-23 DIAGNOSIS — K7460 Unspecified cirrhosis of liver: Secondary | ICD-10-CM | POA: Diagnosis not present

## 2017-08-23 DIAGNOSIS — R296 Repeated falls: Secondary | ICD-10-CM | POA: Diagnosis not present

## 2017-08-25 DIAGNOSIS — K7460 Unspecified cirrhosis of liver: Secondary | ICD-10-CM | POA: Diagnosis not present

## 2017-08-25 DIAGNOSIS — E1122 Type 2 diabetes mellitus with diabetic chronic kidney disease: Secondary | ICD-10-CM | POA: Diagnosis not present

## 2017-08-25 DIAGNOSIS — I130 Hypertensive heart and chronic kidney disease with heart failure and stage 1 through stage 4 chronic kidney disease, or unspecified chronic kidney disease: Secondary | ICD-10-CM | POA: Diagnosis not present

## 2017-08-25 DIAGNOSIS — R296 Repeated falls: Secondary | ICD-10-CM | POA: Diagnosis not present

## 2017-08-25 DIAGNOSIS — I5032 Chronic diastolic (congestive) heart failure: Secondary | ICD-10-CM | POA: Diagnosis not present

## 2017-08-25 DIAGNOSIS — Z853 Personal history of malignant neoplasm of breast: Secondary | ICD-10-CM | POA: Diagnosis not present

## 2017-08-25 DIAGNOSIS — G8929 Other chronic pain: Secondary | ICD-10-CM | POA: Diagnosis not present

## 2017-08-25 DIAGNOSIS — E785 Hyperlipidemia, unspecified: Secondary | ICD-10-CM | POA: Diagnosis not present

## 2017-08-25 DIAGNOSIS — M549 Dorsalgia, unspecified: Secondary | ICD-10-CM | POA: Diagnosis not present

## 2017-08-25 DIAGNOSIS — N189 Chronic kidney disease, unspecified: Secondary | ICD-10-CM | POA: Diagnosis not present

## 2017-08-27 DIAGNOSIS — N183 Chronic kidney disease, stage 3 (moderate): Secondary | ICD-10-CM | POA: Diagnosis not present

## 2017-08-27 DIAGNOSIS — I129 Hypertensive chronic kidney disease with stage 1 through stage 4 chronic kidney disease, or unspecified chronic kidney disease: Secondary | ICD-10-CM | POA: Diagnosis not present

## 2017-08-27 DIAGNOSIS — I10 Essential (primary) hypertension: Secondary | ICD-10-CM | POA: Diagnosis not present

## 2017-08-28 DIAGNOSIS — M5440 Lumbago with sciatica, unspecified side: Secondary | ICD-10-CM | POA: Diagnosis not present

## 2017-08-28 DIAGNOSIS — N183 Chronic kidney disease, stage 3 (moderate): Secondary | ICD-10-CM | POA: Diagnosis not present

## 2017-08-28 DIAGNOSIS — E1121 Type 2 diabetes mellitus with diabetic nephropathy: Secondary | ICD-10-CM | POA: Diagnosis not present

## 2017-08-28 DIAGNOSIS — I129 Hypertensive chronic kidney disease with stage 1 through stage 4 chronic kidney disease, or unspecified chronic kidney disease: Secondary | ICD-10-CM | POA: Diagnosis not present

## 2017-08-29 DIAGNOSIS — Z853 Personal history of malignant neoplasm of breast: Secondary | ICD-10-CM | POA: Diagnosis not present

## 2017-08-29 DIAGNOSIS — K7460 Unspecified cirrhosis of liver: Secondary | ICD-10-CM | POA: Diagnosis not present

## 2017-08-29 DIAGNOSIS — G8929 Other chronic pain: Secondary | ICD-10-CM | POA: Diagnosis not present

## 2017-08-29 DIAGNOSIS — R296 Repeated falls: Secondary | ICD-10-CM | POA: Diagnosis not present

## 2017-08-29 DIAGNOSIS — E1122 Type 2 diabetes mellitus with diabetic chronic kidney disease: Secondary | ICD-10-CM | POA: Diagnosis not present

## 2017-08-29 DIAGNOSIS — I5032 Chronic diastolic (congestive) heart failure: Secondary | ICD-10-CM | POA: Diagnosis not present

## 2017-08-29 DIAGNOSIS — M549 Dorsalgia, unspecified: Secondary | ICD-10-CM | POA: Diagnosis not present

## 2017-08-29 DIAGNOSIS — E785 Hyperlipidemia, unspecified: Secondary | ICD-10-CM | POA: Diagnosis not present

## 2017-08-29 DIAGNOSIS — I130 Hypertensive heart and chronic kidney disease with heart failure and stage 1 through stage 4 chronic kidney disease, or unspecified chronic kidney disease: Secondary | ICD-10-CM | POA: Diagnosis not present

## 2017-08-29 DIAGNOSIS — N189 Chronic kidney disease, unspecified: Secondary | ICD-10-CM | POA: Diagnosis not present

## 2017-08-31 DIAGNOSIS — G8929 Other chronic pain: Secondary | ICD-10-CM | POA: Diagnosis not present

## 2017-08-31 DIAGNOSIS — K7460 Unspecified cirrhosis of liver: Secondary | ICD-10-CM | POA: Diagnosis not present

## 2017-08-31 DIAGNOSIS — R296 Repeated falls: Secondary | ICD-10-CM | POA: Diagnosis not present

## 2017-08-31 DIAGNOSIS — E785 Hyperlipidemia, unspecified: Secondary | ICD-10-CM | POA: Diagnosis not present

## 2017-08-31 DIAGNOSIS — I130 Hypertensive heart and chronic kidney disease with heart failure and stage 1 through stage 4 chronic kidney disease, or unspecified chronic kidney disease: Secondary | ICD-10-CM | POA: Diagnosis not present

## 2017-08-31 DIAGNOSIS — M549 Dorsalgia, unspecified: Secondary | ICD-10-CM | POA: Diagnosis not present

## 2017-08-31 DIAGNOSIS — Z853 Personal history of malignant neoplasm of breast: Secondary | ICD-10-CM | POA: Diagnosis not present

## 2017-08-31 DIAGNOSIS — I5032 Chronic diastolic (congestive) heart failure: Secondary | ICD-10-CM | POA: Diagnosis not present

## 2017-08-31 DIAGNOSIS — E1122 Type 2 diabetes mellitus with diabetic chronic kidney disease: Secondary | ICD-10-CM | POA: Diagnosis not present

## 2017-08-31 DIAGNOSIS — N189 Chronic kidney disease, unspecified: Secondary | ICD-10-CM | POA: Diagnosis not present

## 2017-09-03 DIAGNOSIS — H9113 Presbycusis, bilateral: Secondary | ICD-10-CM | POA: Diagnosis not present

## 2017-09-03 DIAGNOSIS — H903 Sensorineural hearing loss, bilateral: Secondary | ICD-10-CM | POA: Diagnosis not present

## 2017-09-04 DIAGNOSIS — I130 Hypertensive heart and chronic kidney disease with heart failure and stage 1 through stage 4 chronic kidney disease, or unspecified chronic kidney disease: Secondary | ICD-10-CM | POA: Diagnosis not present

## 2017-09-04 DIAGNOSIS — N189 Chronic kidney disease, unspecified: Secondary | ICD-10-CM | POA: Diagnosis not present

## 2017-09-04 DIAGNOSIS — I5032 Chronic diastolic (congestive) heart failure: Secondary | ICD-10-CM | POA: Diagnosis not present

## 2017-09-04 DIAGNOSIS — G8929 Other chronic pain: Secondary | ICD-10-CM | POA: Diagnosis not present

## 2017-09-04 DIAGNOSIS — R296 Repeated falls: Secondary | ICD-10-CM | POA: Diagnosis not present

## 2017-09-04 DIAGNOSIS — E1122 Type 2 diabetes mellitus with diabetic chronic kidney disease: Secondary | ICD-10-CM | POA: Diagnosis not present

## 2017-09-04 DIAGNOSIS — E785 Hyperlipidemia, unspecified: Secondary | ICD-10-CM | POA: Diagnosis not present

## 2017-09-04 DIAGNOSIS — M549 Dorsalgia, unspecified: Secondary | ICD-10-CM | POA: Diagnosis not present

## 2017-09-04 DIAGNOSIS — K7460 Unspecified cirrhosis of liver: Secondary | ICD-10-CM | POA: Diagnosis not present

## 2017-09-04 DIAGNOSIS — Z853 Personal history of malignant neoplasm of breast: Secondary | ICD-10-CM | POA: Diagnosis not present

## 2017-09-11 DIAGNOSIS — E1121 Type 2 diabetes mellitus with diabetic nephropathy: Secondary | ICD-10-CM | POA: Diagnosis not present

## 2017-09-11 DIAGNOSIS — I129 Hypertensive chronic kidney disease with stage 1 through stage 4 chronic kidney disease, or unspecified chronic kidney disease: Secondary | ICD-10-CM | POA: Diagnosis not present

## 2017-09-11 DIAGNOSIS — M5440 Lumbago with sciatica, unspecified side: Secondary | ICD-10-CM | POA: Diagnosis not present

## 2017-09-11 DIAGNOSIS — N183 Chronic kidney disease, stage 3 (moderate): Secondary | ICD-10-CM | POA: Diagnosis not present

## 2017-09-12 DIAGNOSIS — K7460 Unspecified cirrhosis of liver: Secondary | ICD-10-CM | POA: Diagnosis not present

## 2017-09-12 DIAGNOSIS — M549 Dorsalgia, unspecified: Secondary | ICD-10-CM | POA: Diagnosis not present

## 2017-09-12 DIAGNOSIS — I130 Hypertensive heart and chronic kidney disease with heart failure and stage 1 through stage 4 chronic kidney disease, or unspecified chronic kidney disease: Secondary | ICD-10-CM | POA: Diagnosis not present

## 2017-09-12 DIAGNOSIS — Z853 Personal history of malignant neoplasm of breast: Secondary | ICD-10-CM | POA: Diagnosis not present

## 2017-09-12 DIAGNOSIS — I5032 Chronic diastolic (congestive) heart failure: Secondary | ICD-10-CM | POA: Diagnosis not present

## 2017-09-12 DIAGNOSIS — G8929 Other chronic pain: Secondary | ICD-10-CM | POA: Diagnosis not present

## 2017-09-12 DIAGNOSIS — E1122 Type 2 diabetes mellitus with diabetic chronic kidney disease: Secondary | ICD-10-CM | POA: Diagnosis not present

## 2017-09-12 DIAGNOSIS — E785 Hyperlipidemia, unspecified: Secondary | ICD-10-CM | POA: Diagnosis not present

## 2017-09-12 DIAGNOSIS — N189 Chronic kidney disease, unspecified: Secondary | ICD-10-CM | POA: Diagnosis not present

## 2017-09-12 DIAGNOSIS — R296 Repeated falls: Secondary | ICD-10-CM | POA: Diagnosis not present

## 2017-09-13 DIAGNOSIS — N189 Chronic kidney disease, unspecified: Secondary | ICD-10-CM | POA: Diagnosis not present

## 2017-09-13 DIAGNOSIS — K7460 Unspecified cirrhosis of liver: Secondary | ICD-10-CM | POA: Diagnosis not present

## 2017-09-13 DIAGNOSIS — M549 Dorsalgia, unspecified: Secondary | ICD-10-CM | POA: Diagnosis not present

## 2017-09-13 DIAGNOSIS — I130 Hypertensive heart and chronic kidney disease with heart failure and stage 1 through stage 4 chronic kidney disease, or unspecified chronic kidney disease: Secondary | ICD-10-CM | POA: Diagnosis not present

## 2017-09-13 DIAGNOSIS — E1122 Type 2 diabetes mellitus with diabetic chronic kidney disease: Secondary | ICD-10-CM | POA: Diagnosis not present

## 2017-09-13 DIAGNOSIS — E785 Hyperlipidemia, unspecified: Secondary | ICD-10-CM | POA: Diagnosis not present

## 2017-09-13 DIAGNOSIS — Z853 Personal history of malignant neoplasm of breast: Secondary | ICD-10-CM | POA: Diagnosis not present

## 2017-09-13 DIAGNOSIS — I5032 Chronic diastolic (congestive) heart failure: Secondary | ICD-10-CM | POA: Diagnosis not present

## 2017-09-13 DIAGNOSIS — R296 Repeated falls: Secondary | ICD-10-CM | POA: Diagnosis not present

## 2017-09-13 DIAGNOSIS — G8929 Other chronic pain: Secondary | ICD-10-CM | POA: Diagnosis not present

## 2017-09-14 DIAGNOSIS — H401111 Primary open-angle glaucoma, right eye, mild stage: Secondary | ICD-10-CM | POA: Diagnosis not present

## 2017-09-14 DIAGNOSIS — H401123 Primary open-angle glaucoma, left eye, severe stage: Secondary | ICD-10-CM | POA: Diagnosis not present

## 2017-09-18 DIAGNOSIS — R296 Repeated falls: Secondary | ICD-10-CM | POA: Diagnosis not present

## 2017-09-18 DIAGNOSIS — E785 Hyperlipidemia, unspecified: Secondary | ICD-10-CM | POA: Diagnosis not present

## 2017-09-18 DIAGNOSIS — I5032 Chronic diastolic (congestive) heart failure: Secondary | ICD-10-CM | POA: Diagnosis not present

## 2017-09-18 DIAGNOSIS — M549 Dorsalgia, unspecified: Secondary | ICD-10-CM | POA: Diagnosis not present

## 2017-09-18 DIAGNOSIS — E1122 Type 2 diabetes mellitus with diabetic chronic kidney disease: Secondary | ICD-10-CM | POA: Diagnosis not present

## 2017-09-18 DIAGNOSIS — I130 Hypertensive heart and chronic kidney disease with heart failure and stage 1 through stage 4 chronic kidney disease, or unspecified chronic kidney disease: Secondary | ICD-10-CM | POA: Diagnosis not present

## 2017-09-18 DIAGNOSIS — Z853 Personal history of malignant neoplasm of breast: Secondary | ICD-10-CM | POA: Diagnosis not present

## 2017-09-18 DIAGNOSIS — G8929 Other chronic pain: Secondary | ICD-10-CM | POA: Diagnosis not present

## 2017-09-18 DIAGNOSIS — N189 Chronic kidney disease, unspecified: Secondary | ICD-10-CM | POA: Diagnosis not present

## 2017-09-18 DIAGNOSIS — K7460 Unspecified cirrhosis of liver: Secondary | ICD-10-CM | POA: Diagnosis not present

## 2017-09-20 ENCOUNTER — Encounter: Payer: Self-pay | Admitting: Podiatry

## 2017-09-20 ENCOUNTER — Ambulatory Visit: Payer: Medicare HMO | Admitting: Podiatry

## 2017-09-20 DIAGNOSIS — IMO0002 Reserved for concepts with insufficient information to code with codable children: Secondary | ICD-10-CM

## 2017-09-20 DIAGNOSIS — M7752 Other enthesopathy of left foot: Secondary | ICD-10-CM

## 2017-09-20 DIAGNOSIS — M79676 Pain in unspecified toe(s): Secondary | ICD-10-CM

## 2017-09-20 DIAGNOSIS — E1140 Type 2 diabetes mellitus with diabetic neuropathy, unspecified: Secondary | ICD-10-CM

## 2017-09-20 DIAGNOSIS — E1165 Type 2 diabetes mellitus with hyperglycemia: Secondary | ICD-10-CM | POA: Diagnosis not present

## 2017-09-20 DIAGNOSIS — B351 Tinea unguium: Secondary | ICD-10-CM | POA: Diagnosis not present

## 2017-09-20 DIAGNOSIS — M779 Enthesopathy, unspecified: Secondary | ICD-10-CM | POA: Diagnosis not present

## 2017-09-20 NOTE — Progress Notes (Signed)
She presents today with a chief complaint of painful elongated toenails.  Objective: Toenails are long thick yellow dystrophic-like mycotic no open lesions or wounds.  Pulses remain palpable.  Assessment: Pain in limb secondary to onychomycosis.  Plan: Debridement of toenails 1 through 5 bilateral.

## 2017-09-21 ENCOUNTER — Telehealth: Payer: Self-pay | Admitting: *Deleted

## 2017-09-21 NOTE — Telephone Encounter (Signed)
Pt states she saw Dr. Milinda Pointer yesterday to have her toenails trimmed and he injected the left ankle with cortisone and her sugar went way up and she didn't sleep at all last night, and is going to Dr. Catarina Hartshorn [phone] today. Pt requested the notes from yesterday be sent to Dr. Maryjean Ka. I told pt I would fax as soon as possible. Faxed 09/20/2017 clinicals to Dr. Maryjean Ka office.

## 2017-10-02 DIAGNOSIS — R69 Illness, unspecified: Secondary | ICD-10-CM | POA: Diagnosis not present

## 2017-10-11 DIAGNOSIS — M5440 Lumbago with sciatica, unspecified side: Secondary | ICD-10-CM | POA: Diagnosis not present

## 2017-10-11 DIAGNOSIS — I129 Hypertensive chronic kidney disease with stage 1 through stage 4 chronic kidney disease, or unspecified chronic kidney disease: Secondary | ICD-10-CM | POA: Diagnosis not present

## 2017-10-11 DIAGNOSIS — E1121 Type 2 diabetes mellitus with diabetic nephropathy: Secondary | ICD-10-CM | POA: Diagnosis not present

## 2017-10-12 DIAGNOSIS — I129 Hypertensive chronic kidney disease with stage 1 through stage 4 chronic kidney disease, or unspecified chronic kidney disease: Secondary | ICD-10-CM | POA: Diagnosis not present

## 2017-10-12 DIAGNOSIS — E1121 Type 2 diabetes mellitus with diabetic nephropathy: Secondary | ICD-10-CM | POA: Diagnosis not present

## 2017-11-20 DIAGNOSIS — E538 Deficiency of other specified B group vitamins: Secondary | ICD-10-CM | POA: Diagnosis not present

## 2017-11-22 ENCOUNTER — Encounter: Payer: Self-pay | Admitting: Podiatry

## 2017-11-22 ENCOUNTER — Telehealth: Payer: Self-pay | Admitting: *Deleted

## 2017-11-22 ENCOUNTER — Ambulatory Visit: Payer: Medicare HMO | Admitting: Podiatry

## 2017-11-22 DIAGNOSIS — M79676 Pain in unspecified toe(s): Secondary | ICD-10-CM

## 2017-11-22 DIAGNOSIS — B351 Tinea unguium: Secondary | ICD-10-CM | POA: Diagnosis not present

## 2017-11-22 DIAGNOSIS — M779 Enthesopathy, unspecified: Secondary | ICD-10-CM | POA: Diagnosis not present

## 2017-11-22 DIAGNOSIS — E1165 Type 2 diabetes mellitus with hyperglycemia: Secondary | ICD-10-CM

## 2017-11-22 DIAGNOSIS — M7752 Other enthesopathy of left foot: Secondary | ICD-10-CM

## 2017-11-22 DIAGNOSIS — E1140 Type 2 diabetes mellitus with diabetic neuropathy, unspecified: Secondary | ICD-10-CM | POA: Diagnosis not present

## 2017-11-22 DIAGNOSIS — IMO0002 Reserved for concepts with insufficient information to code with codable children: Secondary | ICD-10-CM

## 2017-11-22 NOTE — Progress Notes (Signed)
She presents today with a chief complaint of painful elongated toenails bilateral foot and ankle pain left.  She states that she feels unstable and that she could fall easily or that her left foot gives out on her.  History of ankle surgery 1975 left ankle.  Swelling left lower ankle does not wear her compression hose.  Objective: Vital signs are stable alert and oriented x3.  Pulses are palpable.  Neurologic sensorium is intact deep tendon reflexes are intact muscle strength is normal symmetrical.  She has swelling in the left lower extremity more than likely venous insufficiency but pain around the ankle.  Particularly the lateral gutter and the dorsal lateral aspect of the ankle joint.  Toenails are thick yellow dystrophic with mycotic.  Assessment: Osteoarthritis ankle left capsulitis ankle left pain in limb secondary to onychomycosis.  Plan: Debridement of toenails 1 through 5 bilateral.  After sterile Betadine skin prep injected the lateral gutter and the dorsal ankle joint with 20 mg Kenalog 5 mg Marcaine she tolerated procedure well.  We were also get her into physical therapy [place]

## 2017-11-22 NOTE — Telephone Encounter (Signed)
-----   Message from Rip Harbour, [facility] sent at 11/22/2017 11:50 AM EDT ----- Regarding: PT  I already put an order in for PT for [facility] outpatient. Just wanted to let you know!!

## 2017-11-28 ENCOUNTER — Encounter (HOSPITAL_COMMUNITY): Payer: Self-pay | Admitting: Physical Therapy

## 2017-11-28 ENCOUNTER — Other Ambulatory Visit: Payer: Self-pay

## 2017-11-28 ENCOUNTER — Ambulatory Visit (HOSPITAL_COMMUNITY): Payer: Medicare HMO | Attending: Pulmonary Disease | Admitting: Physical Therapy

## 2017-11-28 DIAGNOSIS — M25672 Stiffness of left ankle, not elsewhere classified: Secondary | ICD-10-CM | POA: Diagnosis not present

## 2017-11-28 DIAGNOSIS — M25572 Pain in left ankle and joints of left foot: Secondary | ICD-10-CM | POA: Diagnosis not present

## 2017-11-28 NOTE — Patient Instructions (Addendum)
ROM: Plantar / Dorsiflexion    With left leg relaxed, gently flex and extend ankle. Move through full range of motion. Avoid pain. Repeat __10_ times per set. Do ___1_ sets per session. Do __3__ sessions per day.  http://orth.exer.us/34   Copyright  VHI. All rights reserved.  ROM: Plantar / Dorsiflexion    With left leg relaxed, gently flex and extend ankle. Move through full range of motion. Avoid pain. Repeat ____ times per set. Do ____ sets per session. Do ____ sessions per day.  http://orth.exer.us/34   Copyright  VHI. All rights reserved.  ROM: Inversion / Eversion    With left leg relaxed, gently turn ankle and foot in and out. Move through full range of motion. Avoid pain. Repeat _10___ times per set. Do __1__ sets per session. Do __2__ sessions per day.  http://orth.exer.us/36   Copyright  VHI. All rights reserved.  Heel Raise (Sitting)    Raise heels, keeping toes on floor. Repeat _10__ times per set. Do _1___ sets per session. Do __2__ sessions per day. Now raise your toes up  http://orth.exer.us/44   Copyright  VHI. All rights reserved.  Ankle Alphabet    Using left ankle and foot only, trace the letters of the alphabet. Perform A to Z. Repeat __1__ times per set. Do ___2_ sets per session. Do ___1_ sessions per day.  http://orth.exer.us/16   Copyright  VHI. All rights reserved.

## 2017-11-28 NOTE — Therapy (Addendum)
[place], [place], [address] Phone: [phone]   Fax:  [phone]  Physical Therapy Evaluation  Patient Details  Name: [name] MRN: [id number] Date of Birth: [birth year] Referring Provider (PT): Max Hyatt    Encounter Date: 11/28/2017  PT End of Session - 11/28/17 1341    Visit Number  1    Number of Visits  12    Date for PT Re-Evaluation  12/28/17    Authorization Type  AETNA    Authorization - Visit Number  1    Authorization - Number of Visits  12    PT Start Time  1300    PT Stop Time  1340    PT Time Calculation (min)  40 min    Activity Tolerance  Patient tolerated treatment well    Behavior During Therapy  [facility] for tasks assessed/performed       Past Medical History:  Diagnosis Date  . Abrasion of arm, left    secondary to fall   . Anemia   . Arthritis   . Breast cancer, left ([place])    er/pr +  . Cataract    no surgery as yet,starting of 3 years  . Chronic kidney disease    renal stones, one episode of Lithotripsy  . Cirrhosis (HCC)    Metavir score 4  . Complication of anesthesia   . Diabetes mellitus   . GERD (gastroesophageal reflux disease)   . Hyperlipidemia   . Hypertension    followed by [facility]. relative to  urology study grp.  Doesn't report ever having a stress test   . Mental disorder   . Multiple falls   . Nocturia   . PONV (postoperative nausea and vomiting)   . Seasonal allergies   . Skin cancer    face- melanoma, 2012- surg. excision      Past Surgical History:  Procedure Laterality Date  . ABDOMINAL HYSTERECTOMY    . BACK SURGERY     x4 back surgery to include rods & screws  . Last in May 2014  . BREAST LUMPECTOMY WITH NEEDLE LOCALIZATION AND AXILLARY SENTINEL LYMPH NODE BX  03/12/2012   Procedure: BREAST LUMPECTOMY WITH NEEDLE LOCALIZATION AND AXILLARY SENTINEL LYMPH NODE BX;  Surgeon: Merrie Roof, MD;  Location: [facility];  Service: General;   Laterality: Left;  . BREAST SURGERY     lumpectomy x2/left   . CHOLECYSTECTOMY    . COLONOSCOPY N/A 11/20/2013   Dr.Rourk- diverticula was found in the left colon. o/w normal rectal, colonic and terminal ileal mucosa  . ESOPHAGOGASTRODUODENOSCOPY N/A 11/20/2013   Dr.Rourk- abnormal mucosa was found. diffuse snake skinning and friability of the gastric mucosa. patent pylorus. abnormal-appearing first, second and third portion of the duodenum. 79mm gastric nodule at the GE junction. stomach bx= minimal chronic infammation, GE junction bx= polypoid fragments of gastric cardia type mucosa w/mod chronic inflammation and foveolar hyperplasia  . EYE SURGERY     cataract surgery bilat   . FRACTURE SURGERY     1975- ORIF- L ankle   . GIVENS CAPSULE STUDY N/A 02/24/2014   Procedure: GIVENS CAPSULE STUDY;  Surgeon: Daneil Dolin, MD;  [facility];  Service: Endoscopy;  Laterality: N/A;  . RE-EXCISION OF BREAST LUMPECTOMY  03/22/2012   Procedure: RE-EXCISION OF BREAST LUMPECTOMY;  Surgeon: Merrie Roof, MD;  Location: [place];  Service: General;  Laterality: Left;  re-excision left  breast lumpectomy cavity  ER+, PR+  . ROTATOR CUFF REPAIR Bilateral   . SHOULDER SURGERY    . SKIN BIOPSY    . TOTAL KNEE ARTHROPLASTY Right 12/18/2014   Procedure: RIGHT TOTAL KNEE ARTHROPLASTY;  Surgeon: Sydnee Cabal, MD;  [facility];  Service: Orthopedics;  Laterality: Right;  . TUBAL LIGATION      There were no vitals filed for this visit.   Subjective Assessment - 11/28/17 1310    Subjective  Ms.  [name] states that for the past several months she has been having pain and instability in her left ankle when she is walking.  She went to the podiatrist who referred her to therapy.     Pertinent History  Hx 1975 having Lt ankle surgery, DM, HTN, skin cancer, LBP, acute renal failure UTI's     Limitations  Standing;Walking;Lifting;House hold activities    How long can you sit  comfortably?  no problem     How long can you stand comfortably?  hurts back therefore limited to five minutes     How long can you walk comfortably?  uses a rollator able to walk about 5 minutes .    Patient Stated Goals  less pain, to be able to walk better.     Currently in Pain?  Yes    Pain Score  5    worst 8/10    Pain Location  Ankle    Pain Orientation  Left    Pain Descriptors / Indicators  Aching    Pain Type  Chronic pain    Pain Onset  More than a month ago    Pain Frequency  Constant    Aggravating Factors   moving and weight bearing     Pain Relieving Factors  elevate     Effect of Pain on Daily Activities  liimits          [facility] PT Assessment - 11/28/17 0001      Assessment   Medical Diagnosis  LT ankle pain/difficulty walking     Referring Provider (PT)  Max Hyatt     Onset Date/Surgical Date  08/27/17    Next MD Visit  01/24/2018    Prior Therapy  none for this reason      Precautions   Precautions  Fall      Restrictions   Weight Bearing Restrictions  No      Balance Screen   Has the patient fallen in the past 6 months  No    Has the patient had a decrease in activity level because of a fear of falling?   Yes    Is the patient reluctant to leave their home because of a fear of falling?   No      Home Environment   Living Environment  Private residence    Type of [place] to enter    Entrance Stairs-Number of Steps  2    [place]  One level      Prior Function   Level of [place]  Retired    Leisure  read      Cognition   Overall Cognitive Status  Within Functional Limits for tasks assessed      Observation/Other Assessments   Focus on Therapeutic Outcomes (FOTO)   42      Observation/Other Assessments-Edema    Edema  Figure 8      Functional  Tests   Functional tests  Single leg stance;Sit to Stand      Single Leg Stance   Comments  RT:  0  LT:  0      Sit to Stand    Comments  5x 24.71      ROM / Strength   AROM / PROM / Strength  AROM;Strength      AROM   AROM Assessment Site  Knee;Ankle    Right/Left Ankle  Right;Left    Right Ankle Dorsiflexion  8    Right Ankle Plantar Flexion  60    Right Ankle Inversion  20    Right Ankle Eversion  25    Left Ankle Dorsiflexion  -3    Left Ankle Plantar Flexion  40    Left Ankle Inversion  10    Left Ankle Eversion  20      Strength   Strength Assessment Site  Knee;Ankle    Right/Left Knee  Right;Left    Right/Left Ankle  Right;Left    Right Ankle Dorsiflexion  4+/5    Right Ankle Plantar Flexion  4+/5    Right Ankle Inversion  4/5    Right Ankle Eversion  3/5    Left Ankle Dorsiflexion  4/5    Left Ankle Plantar Flexion  4+/5    Left Ankle Inversion  4-/5    Left Ankle Eversion  3-/5      Ambulation/Gait   Gait Comments  Pt walks forward bent with wt on forearms.          LYMPHEDEMA/ONCOLOGY QUESTIONNAIRE - 11/28/17 1320      What other symptoms do you have   Are you Having Heaviness or Tightness  Yes    Are you having Pain  Yes    Are you having pitting edema  Yes      Lymphedema Stage   Stage  STAGE 2 SPONTANEOUSLY IRREVERSIBLE      Lymphedema Assessments   Lymphedema Assessments  Lower extremities      Right Lower Extremity Lymphedema   At Midpatella/Popliteal Crease  43 cm    30 cm Proximal to Floor at Lateral Plantar Foot  43 cm    20 cm Proximal to Floor at Lateral Plantar Foot  31.7    10 cm Proximal to Floor at Lateral Malleoli  23 cm      Left Lower Extremity Lymphedema   At Midpatella/Popliteal Crease  45.7 cm    30 cm Proximal to Floor at Lateral Plantar Foot  44.3 cm    20 cm Proximal to Floor at Lateral Plantar Foot  34.6 cm    10 cm Proximal to Floor at Lateral Malleoli  26.5 cm             Objective measurements completed on examination: See above findings.      [facility] Adult PT Treatment/Exercise - 11/28/17 0001      Exercises   Exercises  Ankle       Ankle Exercises: Seated   ABC's  1 rep    Heel Raises  10 reps    Toe Raise  10 reps      Ankle Exercises: Supine   Other Supine Ankle Exercises  ROM  x 10             PT Education - 11/28/17 1340    Education Details  HEP    Person(s) Educated  Patient    Methods  Explanation;Demonstration    Comprehension  Verbalized  understanding;Returned demonstration;Verbal cues required;Tactile cues required       PT Short Term Goals - 11/28/17 1431      PT SHORT TERM GOAL #1   Title  PT Lt DF to improve to 3 degrees dorsiflexion to allow pt to have a more normalized gait.     Time  2    Period  Weeks    Status  New    Target Date  12/12/17      PT SHORT TERM GOAL #2   Title  Core strength improved 1/2 grade to allow pt to stand more erect to take stress of both her back and her ankle.     Time  2    Period  Weeks    Status  New      PT SHORT TERM GOAL #3   Title  Pt to be able to walk for 10 mintues with her rollator.     Time  2    Period  Weeks    Status  New      PT SHORT TERM GOAL #4   Title  Pt edema to be decreased by 1.5 cm to allow improved flexibility     Time  2    Period  Weeks    Status  New        PT Long Term Goals - 11/28/17 1433      PT LONG TERM GOAL #1   Title  Pt Dorsiflexion on left ankle to have increased to 8 degrees to allow normal motion of ankle while walking     Time  4    Period  Weeks    Status  New    Target Date  12/26/17      PT LONG TERM GOAL #2   Title  PT Pain in her left ankle to be no greater than a 4/10 to allow pt to be up for 15 mintues at a time with her rollator to complete household tasks.     Time  4    Period  Weeks    Status  New      PT LONG TERM GOAL #3   Title  PT to have lost 2-3 cm of volume from Lt LE to allow pt to don compression garment with greater ease.     Time  4    Period  Weeks    Status  New      PT LONG TERM GOAL #4   Title  Pt to understand that she needs to wear compression garments on  a regular basis to prevent increased edema.     Time  4    Period  Weeks    Status  New      PT LONG TERM GOAL #5   Title  PT to be able to single leg stance for 5 seconds bilaterally to decrease risk of falling     Time  4    Period  Weeks    Status  New      Additional Long Term Goals   Additional Long Term Goals  Yes      PT LONG TERM GOAL #6   Title  Pt Lt ankle strength to be at least a 4+/5 to allow improved stability while walking     Time  4    Period  Weeks    Status  New             Plan - 11/28/17 1423  Clinical Impression Statement  Ms. Gierke is an 84 yo who has had chronic Lt ankle pain which exacerbated in the past several months for some unknown reason.  She has chronic back pain and has been walking forward bent on her rollator which may have aggrevated her ankle.  She is being referred to skilled physical therapy for improved ROM and strength of her ankle.  Examination demonstrates chronic swelling, (possibly lymphedema), decreased ROM, decreased strength, decreased balance and difficulty walking.  Ms. Fugere will benefit from skilled physical therapy to address these issues and maximize her functional abiltiy.       History and Personal Factors relevant to plan of care:  Fx Lt ankle, HTN, CKD, DM, chronic low back pain, RT TKR    Clinical Presentation  Stable    Clinical Decision Making  Moderate    Rehab Potential  Good    PT Frequency  3x / week    PT Duration  4 weeks    PT Treatment/Interventions  ADLs/Self Care Home Management;Therapeutic exercise;Balance training;Neuromuscular re-education;Cognitive remediation;Patient/family education;Manual techniques;Manual lymph drainage;Compression bandaging;Passive range of motion    PT Next Visit Plan  Begin compression bandaging to decrease swelling in Lt LE prior to pt obtaining compression garment.  Begin t-band exercises, sitting BAPS, narrow base of support for balance, and heel raises.     PT Home Exercise  Plan  ABC, Sitting Dorsi/plantarflexion, supine ROM exercises.     Consulted and Agree with Plan of Care  Patient       Patient will benefit from skilled therapeutic intervention in order to improve the following deficits and impairments:  Abnormal gait, Decreased activity tolerance, Decreased balance, Decreased mobility, Decreased range of motion, Decreased strength, Difficulty walking, Hypomobility, Increased edema, Postural dysfunction, Obesity  Visit Diagnosis: Pain in left ankle and joints of left foot - Plan: PT plan of care cert/re-cert  Stiffness of left ankle, not elsewhere classified - Plan: PT plan of care cert/re-cert     Problem List Patient Active Problem List   Diagnosis Date Noted  . Altered mental status 08/18/2017  . Acute encephalopathy 08/17/2017  . Encephalopathy acute 08/17/2017  . Encephalopathy 07/25/2017  . Diabetes mellitus ([place]) 09/02/2015  . Anemia due to multiple mechanisms 02/17/2015  . Chronic LBP 02/17/2015  . Impaired renal function 02/17/2015  . Abducens nerve weakness 02/17/2015  . S/P knee replacement 12/18/2014  . Osteoarthritis of right knee 12/18/2014  . IDA (iron deficiency anemia) 10/29/2013  . Vitamin B12 deficiency anemia 09/09/2013  . Anemia, iron deficiency 09/09/2013  . Lumbago 08/06/2013  . Difficulty in walking(719.7) 08/06/2013  . Osteopenia 03/11/2013  . Malignant neoplasm of lower-inner quadrant of female breast ([place]) 03/10/2013  . Bilateral leg weakness 09/23/2012  . Acute renal failure ([place]) 07/25/2012  . Dehydration 07/25/2012  . DM type 2, uncontrolled, with neuropathy ([place]) 07/25/2012  . DM (diabetes mellitus), type 2, uncontrolled ([place]) 07/25/2012  . Essential hypertension, benign 07/25/2012  . Generalized weakness 07/25/2012  . Hyperlipidemia   . Chronic kidney disease   . Skin cancer   . Hypertension   . GERD (gastroesophageal reflux disease)   . Breast cancer ([place]) 02/19/2012  . CAP (community acquired  pneumonia) 04/19/2011  . Nausea and vomiting 04/19/2011  . HTN (hypertension) 04/19/2011  . DM type 2 (diabetes mellitus, type 2) ([place]) 04/19/2011  . Arthritis 04/19/2011   Rayetta Humphrey, PT CLT [phone] 11/28/2017, 3:54 PM  [facility] [address] [place], [place], [address]  Phone: [phone]   Fax:  [phone]  Name: [name] MRN: [id number] Date of Birth: [birth year]

## 2017-12-04 ENCOUNTER — Ambulatory Visit (HOSPITAL_COMMUNITY): Payer: Medicare HMO | Admitting: Physical Therapy

## 2017-12-04 DIAGNOSIS — M25672 Stiffness of left ankle, not elsewhere classified: Secondary | ICD-10-CM

## 2017-12-04 DIAGNOSIS — M25572 Pain in left ankle and joints of left foot: Secondary | ICD-10-CM

## 2017-12-04 NOTE — Therapy (Signed)
[place], [place], [address] Phone: [phone]   Fax:  [phone]  Physical Therapy Treatment  Patient Details  Name: [name] MRN: [id number] Date of Birth: 03/17/1933 Referring Provider (PT): Max Hyatt    Encounter Date: 12/04/2017  PT End of Session - 12/04/17 1126    Visit Number  2    Number of Visits  12    Date for PT Re-Evaluation  12/28/17    Authorization Type  AETNA    Authorization - Visit Number  2    Authorization - Number of Visits  12    PT Start Time  9485    PT Stop Time  1115    PT Time Calculation (min)  40 min    Activity Tolerance  Patient tolerated treatment well    Behavior During Therapy  [facility] for tasks assessed/performed       Past Medical History:  Diagnosis Date  . Abrasion of arm, left    secondary to fall   . Anemia   . Arthritis   . Breast cancer, left ([place])    er/pr +  . Cataract    no surgery as yet,starting of 3 years  . Chronic kidney disease    renal stones, one episode of Lithotripsy  . Cirrhosis (HCC)    Metavir score 4  . Complication of anesthesia   . Diabetes mellitus   . GERD (gastroesophageal reflux disease)   . Hyperlipidemia   . Hypertension    followed by [facility]. relative to  urology study grp.  Doesn't report ever having a stress test   . Mental disorder   . Multiple falls   . Nocturia   . PONV (postoperative nausea and vomiting)   . Seasonal allergies   . Skin cancer    face- melanoma, 2012- surg. excision      Past Surgical History:  Procedure Laterality Date  . ABDOMINAL HYSTERECTOMY    . BACK SURGERY     x4 back surgery to include rods & screws  . Last in May 2014  . BREAST LUMPECTOMY WITH NEEDLE LOCALIZATION AND AXILLARY SENTINEL LYMPH NODE BX  03/12/2012   Procedure: BREAST LUMPECTOMY WITH NEEDLE LOCALIZATION AND AXILLARY SENTINEL LYMPH NODE BX;  Surgeon: Merrie Roof, MD;  Location: [facility];  Service: General;   Laterality: Left;  . BREAST SURGERY     lumpectomy x2/left   . CHOLECYSTECTOMY    . COLONOSCOPY N/A 11/20/2013   Dr.Rourk- diverticula was found in the left colon. o/w normal rectal, colonic and terminal ileal mucosa  . ESOPHAGOGASTRODUODENOSCOPY N/A 11/20/2013   Dr.Rourk- abnormal mucosa was found. diffuse snake skinning and friability of the gastric mucosa. patent pylorus. abnormal-appearing first, second and third portion of the duodenum. 39mm gastric nodule at the GE junction. stomach bx= minimal chronic infammation, GE junction bx= polypoid fragments of gastric cardia type mucosa w/mod chronic inflammation and foveolar hyperplasia  . EYE SURGERY     cataract surgery bilat   . FRACTURE SURGERY     1975- ORIF- L ankle   . GIVENS CAPSULE STUDY N/A 02/24/2014   Procedure: GIVENS CAPSULE STUDY;  Surgeon: Daneil Dolin, MD;  [facility];  Service: Endoscopy;  Laterality: N/A;  . RE-EXCISION OF BREAST LUMPECTOMY  03/22/2012   Procedure: RE-EXCISION OF BREAST LUMPECTOMY;  Surgeon: Merrie Roof, MD;  Location: [facility];  Service: General;  Laterality: Left;  re-excision left  breast lumpectomy cavity  ER+, PR+  . ROTATOR CUFF REPAIR Bilateral   . SHOULDER SURGERY    . SKIN BIOPSY    . TOTAL KNEE ARTHROPLASTY Right 12/18/2014   Procedure: RIGHT TOTAL KNEE ARTHROPLASTY;  Surgeon: Sydnee Cabal, MD;  [facility];  Service: Orthopedics;  Laterality: Right;  . TUBAL LIGATION      There were no vitals filed for this visit.  Subjective Assessment - 12/04/17 1041    Subjective  PT states her ankle is stiff and gives away at times.  MOstly around 5/10 and has been hurting for months with the swelling getting worse.  STates she has a daughter who would come and help her with stockings when ready.     Currently in Pain?  Yes    Pain Score  5     Pain Location  Ankle    Pain Orientation  Left    Pain Descriptors / Indicators  Aching    Pain Type  Chronic pain                        [facility] Adult PT Treatment/Exercise - 12/04/17 0001      Manual Therapy   Manual Therapy  Compression Bandaging    Manual therapy comments  completed at [facility] from all other skilled interventions    Compression Bandaging  for Lt distal LE using 1/2" foam and multilayer compression bandaging.       Ankle Exercises: Seated   ABC's  1 rep    Heel Raises  10 reps    Toe Raise  10 reps      Ankle Exercises: Supine   Other Supine Ankle Exercises  ROM  x 10             PT Education - 12/04/17 1126    Education Details  reviewed HEP and goals.  Instructed to remove if pain/numbness/etc.  instructed to remove prior to next appt and roll all bandages to return.    Person(s) Educated  Patient    Methods  Explanation    Comprehension  Verbalized understanding       PT Short Term Goals - 12/04/17 1127      PT SHORT TERM GOAL #1   Title  PT Lt DF to improve to 3 degrees dorsiflexion to allow pt to have a more normalized gait.     Time  2    Period  Weeks    Status  On-going      PT SHORT TERM GOAL #2   Title  Core strength improved 1/2 grade to allow pt to stand more erect to take stress of both her back and her ankle.     Time  2    Period  Weeks    Status  On-going      PT SHORT TERM GOAL #3   Title  Pt to be able to walk for 10 mintues with her rollator.     Time  2    Period  Weeks    Status  On-going      PT SHORT TERM GOAL #4   Title  Pt edema to be decreased by 1.5 cm to allow improved flexibility     Time  2    Period  Weeks    Status  On-going        PT Long Term Goals - 12/04/17 1127      PT LONG TERM GOAL #1  Title  Pt Dorsiflexion on left ankle to have increased to 8 degrees to allow normal motion of ankle while walking     Time  4    Period  Weeks    Status  On-going      PT LONG TERM GOAL #2   Title  PT Pain in her left ankle to be no greater than a 4/10 to allow pt to be up for 15 mintues at a time with  her rollator to complete household tasks.     Time  4    Period  Weeks    Status  On-going      PT LONG TERM GOAL #3   Title  PT to have lost 2-3 cm of volume from Lt LE to allow pt to don compression garment with greater ease.     Time  4    Period  Weeks    Status  On-going      PT LONG TERM GOAL #4   Title  Pt to understand that she needs to wear compression garments on a regular basis to prevent increased edema.     Time  4    Period  Weeks    Status  On-going      PT LONG TERM GOAL #5   Title  PT to be able to single leg stance for 5 seconds bilaterally to decrease risk of falling     Time  4    Period  Weeks    Status  On-going      PT LONG TERM GOAL #6   Title  Pt Lt ankle strength to be at least a 4+/5 to allow improved stability while walking     Time  4    Period  Weeks    Status  On-going            Plan - 12/04/17 1139    Clinical Impression Statement  Reveiwed evaluation goals and HEP issued.  Pt needed extra instruction with ankle alphabet as she had difficulty wtih concept of the exercise.  Limited ROM in Lt ankle as compared to RT completing heel and toe raises.  Explained treatment of lymphedema, cut foam and completed bandaging to Lt distal LE.  Pt reported overall comfort.  Pt given information on removal and wear of bandaging.  Pt verbalized understanding.  did not have time to add new exercises today as requested in last POC.      Rehab Potential  Good    PT Frequency  3x / week    PT Duration  4 weeks    PT Treatment/Interventions  ADLs/Self Care Home Management;Therapeutic exercise;Balance training;Neuromuscular re-education;Cognitive remediation;Patient/family education;Manual techniques;Manual lymph drainage;Compression bandaging;Passive range of motion    PT Next Visit Plan  Measure volume in Lt LE next session.   Begin t-band exercises, sitting BAPS, narrow base of support for balance, and heel raises.     PT Home Exercise Plan  ABC, Sitting  Dorsi/plantarflexion, supine ROM exercises.     Consulted and Agree with Plan of Care  Patient       Patient will benefit from skilled therapeutic intervention in order to improve the following deficits and impairments:  Abnormal gait, Decreased activity tolerance, Decreased balance, Decreased mobility, Decreased range of motion, Decreased strength, Difficulty walking, Hypomobility, Increased edema, Postural dysfunction, Obesity  Visit Diagnosis: Pain in left ankle and joints of left foot  Stiffness of left ankle, not elsewhere classified     Problem List Patient Active  Problem List   Diagnosis Date Noted  . Altered mental status 08/18/2017  . Acute encephalopathy 08/17/2017  . Encephalopathy acute 08/17/2017  . Encephalopathy 07/25/2017  . Diabetes mellitus ([place]) 09/02/2015  . Anemia due to multiple mechanisms 02/17/2015  . Chronic LBP 02/17/2015  . Impaired renal function 02/17/2015  . Abducens nerve weakness 02/17/2015  . S/P knee replacement 12/18/2014  . Osteoarthritis of right knee 12/18/2014  . IDA (iron deficiency anemia) 10/29/2013  . Vitamin B12 deficiency anemia 09/09/2013  . Anemia, iron deficiency 09/09/2013  . Lumbago 08/06/2013  . Difficulty in walking(719.7) 08/06/2013  . Osteopenia 03/11/2013  . Malignant neoplasm of lower-inner quadrant of female breast ([place]) 03/10/2013  . Bilateral leg weakness 09/23/2012  . Acute renal failure ([place]) 07/25/2012  . Dehydration 07/25/2012  . DM type 2, uncontrolled, with neuropathy ([place]) 07/25/2012  . DM (diabetes mellitus), type 2, uncontrolled ([place]) 07/25/2012  . Essential hypertension, benign 07/25/2012  . Generalized weakness 07/25/2012  . Hyperlipidemia   . Chronic kidney disease   . Skin cancer   . Hypertension   . GERD (gastroesophageal reflux disease)   . Breast cancer ([place]) 02/19/2012  . CAP (community acquired pneumonia) 04/19/2011  . Nausea and vomiting 04/19/2011  . HTN (hypertension) 04/19/2011  . DM  type 2 (diabetes mellitus, type 2) ([place]) 04/19/2011  . Arthritis 04/19/2011   Teena Irani, PTA/CLT [phone]  Teena Irani 12/04/2017, 11:43 AM  [facility] [address] [place], [place], [address] Phone: [phone]   Fax:  [phone]  Name: SONNY POTH MRN: [id number] Date of Birth: 10-25-1933

## 2017-12-06 ENCOUNTER — Ambulatory Visit (HOSPITAL_COMMUNITY): Payer: Medicare HMO | Admitting: Physical Therapy

## 2017-12-06 ENCOUNTER — Encounter (HOSPITAL_COMMUNITY): Payer: Self-pay | Admitting: Physical Therapy

## 2017-12-06 DIAGNOSIS — M25672 Stiffness of left ankle, not elsewhere classified: Secondary | ICD-10-CM | POA: Diagnosis not present

## 2017-12-06 DIAGNOSIS — M25572 Pain in left ankle and joints of left foot: Secondary | ICD-10-CM

## 2017-12-06 NOTE — Therapy (Signed)
[place], [place], [address] Phone: [phone]   Fax:  [phone]  Physical Therapy Treatment  Patient Details  Name: [name] MRN: [id number] Date of Birth: 1933/03/28 Referring Provider (PT): Max Milinda Pointer    Encounter Date: 12/06/2017  PT End of Session - 12/06/17 1258    Visit Number  3    Number of Visits  12    Date for PT Re-Evaluation  12/28/17    Authorization Type  AETNA    Authorization - Visit Number  3    Authorization - Number of Visits  12    PT Start Time  8099    PT Stop Time  1115    PT Time Calculation (min)  40 min    Activity Tolerance  Patient tolerated treatment well    Behavior During Therapy  [facility] for tasks assessed/performed       Past Medical History:  Diagnosis Date  . Abrasion of arm, left    secondary to fall   . Anemia   . Arthritis   . Breast cancer, left ([place])    er/pr +  . Cataract    no surgery as yet,starting of 3 years  . Chronic kidney disease    renal stones, one episode of Lithotripsy  . Cirrhosis (HCC)    Metavir score 4  . Complication of anesthesia   . Diabetes mellitus   . GERD (gastroesophageal reflux disease)   . Hyperlipidemia   . Hypertension    followed by [facility]. relative to  urology study grp.  Doesn't report ever having a stress test   . Mental disorder   . Multiple falls   . Nocturia   . PONV (postoperative nausea and vomiting)   . Seasonal allergies   . Skin cancer    face- melanoma, 2012- surg. excision      Past Surgical History:  Procedure Laterality Date  . ABDOMINAL HYSTERECTOMY    . BACK SURGERY     x4 back surgery to include rods & screws  . Last in May 2014  . BREAST LUMPECTOMY WITH NEEDLE LOCALIZATION AND AXILLARY SENTINEL LYMPH NODE BX  03/12/2012   Procedure: BREAST LUMPECTOMY WITH NEEDLE LOCALIZATION AND AXILLARY SENTINEL LYMPH NODE BX;  Surgeon: Merrie Roof, MD;  Location: [place];  Service: General;   Laterality: Left;  . BREAST SURGERY     lumpectomy x2/left   . CHOLECYSTECTOMY    . COLONOSCOPY N/A 11/20/2013   Dr.Rourk- diverticula was found in the left colon. o/w normal rectal, colonic and terminal ileal mucosa  . ESOPHAGOGASTRODUODENOSCOPY N/A 11/20/2013   Dr.Rourk- abnormal mucosa was found. diffuse snake skinning and friability of the gastric mucosa. patent pylorus. abnormal-appearing first, second and third portion of the duodenum. 26mm gastric nodule at the GE junction. stomach bx= minimal chronic infammation, GE junction bx= polypoid fragments of gastric cardia type mucosa w/mod chronic inflammation and foveolar hyperplasia  . EYE SURGERY     cataract surgery bilat   . FRACTURE SURGERY     1975- ORIF- L ankle   . GIVENS CAPSULE STUDY N/A 02/24/2014   Procedure: GIVENS CAPSULE STUDY;  Surgeon: Daneil Dolin, MD;  [facility];  Service: Endoscopy;  Laterality: N/A;  . RE-EXCISION OF BREAST LUMPECTOMY  03/22/2012   Procedure: RE-EXCISION OF BREAST LUMPECTOMY;  Surgeon: Merrie Roof, MD;  Location: [place];  Service: General;  Laterality: Left;  re-excision left  breast lumpectomy cavity  ER+, PR+  . ROTATOR CUFF REPAIR Bilateral   . SHOULDER SURGERY    . SKIN BIOPSY    . TOTAL KNEE ARTHROPLASTY Right 12/18/2014   Procedure: RIGHT TOTAL KNEE ARTHROPLASTY;  Surgeon: Sydnee Cabal, MD;  [facility];  Service: Orthopedics;  Laterality: Right;  . TUBAL LIGATION      There were no vitals filed for this visit.  Subjective Assessment - 12/06/17 1041    Subjective  Pt states that the bandages were comfortable and she was able to keep them on until this morning.  She does not understand the ABC exercise.     Pertinent History  Hx 1975 having Lt ankle surgery, DM, HTN, skin cancer, LBP, acute renal failure UTI's     Limitations  Standing;Walking;Lifting;House hold activities    How long can you sit comfortably?  no problem     How long can you stand  comfortably?  hurts back therefore limited to five minutes     How long can you walk comfortably?  uses a rollator able to walk about 5 minutes .    Patient Stated Goals  less pain, to be able to walk better.     Currently in Pain?  Yes    Pain Score  5     Pain Location  Ankle    Pain Orientation  Left    Pain Descriptors / Indicators  Aching    Pain Onset  More than a month ago    Pain Frequency  Intermittent    Aggravating Factors   walking     Pain Relieving Factors  rest     Effect of Pain on Daily Activities  limits            LYMPHEDEMA/ONCOLOGY QUESTIONNAIRE - 12/06/17 1054      Left Lower Extremity Lymphedema   At Midpatella/Popliteal Crease  45.3 cm    30 cm Proximal to Floor at Lateral Plantar Foot  42.5 cm    20 cm Proximal to Floor at Lateral Plantar Foot  31.5 cm    10 cm Proximal to Floor at Lateral Malleoli  24 cm                [facility] Adult PT Treatment/Exercise - 12/06/17 0001      Exercises   Exercises  Ankle      Manual Therapy   Manual Therapy  Compression Bandaging    Manual therapy comments  completed at [facility] seperate from all other skilled interventions    Compression Bandaging  for Lt distal LE using 1/2" foam and multilayer compression bandaging.       Ankle Exercises: Seated   ABC's  1 rep    ABC's Limitations  AA for first 1/2 followed by I     Heel Raises  10 reps    Toe Raise  10 reps    BAPS  Sitting;Level 2    BAPS Limitations  10 reps all directions including clockwise and counter clockwise                PT Short Term Goals - 12/06/17 1304      PT SHORT TERM GOAL #1   Title  PT Lt DF to improve to 3 degrees dorsiflexion to allow pt to have a more normalized gait.     Time  2    Period  Weeks    Status  On-going      PT SHORT TERM GOAL #2  Title  Core strength improved 1/2 grade to allow pt to stand more erect to take stress of both her back and her ankle.     Time  2    Period  Weeks    Status  On-going       PT SHORT TERM GOAL #3   Title  Pt to be able to walk for 10 mintues with her rollator.     Time  2    Period  Weeks    Status  On-going      PT SHORT TERM GOAL #4   Title  Pt edema to be decreased by 1.5 cm to allow improved flexibility     Time  2    Period  Weeks    Status  Achieved        PT Long Term Goals - 12/06/17 1305      PT LONG TERM GOAL #1   Title  Pt Dorsiflexion on left ankle to have increased to 8 degrees to allow normal motion of ankle while walking     Time  4    Period  Weeks    Status  On-going      PT LONG TERM GOAL #2   Title  PT Pain in her left ankle to be no greater than a 4/10 to allow pt to be up for 15 mintues at a time with her rollator to complete household tasks.     Time  4    Period  Weeks    Status  On-going      PT LONG TERM GOAL #3   Title  PT to have lost 2-3 cm of volume from Lt LE to allow pt to don compression garment with greater ease.     Time  4    Period  Weeks    Status  Achieved      PT LONG TERM GOAL #4   Title  Pt to understand that she needs to wear compression garments on a regular basis to prevent increased edema.     Time  4    Period  Weeks    Status  Achieved      PT LONG TERM GOAL #5   Title  PT to be able to single leg stance for 5 seconds bilaterally to decrease risk of falling     Time  4    Period  Weeks    Status  On-going      PT LONG TERM GOAL #6   Title  Pt Lt ankle strength to be at least a 4+/5 to allow improved stability while walking     Time  4    Period  Weeks    Status  On-going            Plan - 12/06/17 1259    Clinical Impression Statement  Pt remeasured with significant decreased edema since bandaging.  Pt and therapist talked about different compression options; pt believes that she will be happier with juxta;  due to significant edema reduction therapist feels pt edema will be able to be contained witn juxtalite vs. juxtafit.  An order was sent to Dr Milinda Pointer.      Rehab  Potential  Good    PT Frequency  3x / week    PT Duration  4 weeks    PT Treatment/Interventions  ADLs/Self Care Home Management;Therapeutic exercise;Balance training;Neuromuscular re-education;Cognitive remediation;Patient/family education;Manual techniques;Manual lymph drainage;Compression bandaging;Passive range of motion    PT Next Visit  Plan    Begin t-band exercises,  narrow base of support for balance, and heel raises.     PT Home Exercise Plan  ABC, Sitting Dorsi/plantarflexion, supine ROM exercises.     Consulted and Agree with Plan of Care  Patient       Patient will benefit from skilled therapeutic intervention in order to improve the following deficits and impairments:  Abnormal gait, Decreased activity tolerance, Decreased balance, Decreased mobility, Decreased range of motion, Decreased strength, Difficulty walking, Hypomobility, Increased edema, Postural dysfunction, Obesity  Visit Diagnosis: Pain in left ankle and joints of left foot  Stiffness of left ankle, not elsewhere classified     Problem List Patient Active Problem List   Diagnosis Date Noted  . Altered mental status 08/18/2017  . Acute encephalopathy 08/17/2017  . Encephalopathy acute 08/17/2017  . Encephalopathy 07/25/2017  . Diabetes mellitus ([place]) 09/02/2015  . Anemia due to multiple mechanisms 02/17/2015  . Chronic LBP 02/17/2015  . Impaired renal function 02/17/2015  . Abducens nerve weakness 02/17/2015  . S/P knee replacement 12/18/2014  . Osteoarthritis of right knee 12/18/2014  . IDA (iron deficiency anemia) 10/29/2013  . Vitamin B12 deficiency anemia 09/09/2013  . Anemia, iron deficiency 09/09/2013  . Lumbago 08/06/2013  . Difficulty in walking(719.7) 08/06/2013  . Osteopenia 03/11/2013  . Malignant neoplasm of lower-inner quadrant of female breast ([place]) 03/10/2013  . Bilateral leg weakness 09/23/2012  . Acute renal failure ([place]) 07/25/2012  . Dehydration 07/25/2012  . DM type 2,  uncontrolled, with neuropathy ([place]) 07/25/2012  . DM (diabetes mellitus), type 2, uncontrolled ([place]) 07/25/2012  . Essential hypertension, benign 07/25/2012  . Generalized weakness 07/25/2012  . Hyperlipidemia   . Chronic kidney disease   . Skin cancer   . Hypertension   . GERD (gastroesophageal reflux disease)   . Breast cancer ([place]) 02/19/2012  . CAP (community acquired pneumonia) 04/19/2011  . Nausea and vomiting 04/19/2011  . HTN (hypertension) 04/19/2011  . DM type 2 (diabetes mellitus, type 2) ([employer]) 04/19/2011  . Arthritis 04/19/2011    Rayetta Humphrey, PT CLT [phone] 12/06/2017, 1:06 PM  [place] [address] [place], [place], [address] Phone: [phone]   Fax:  [phone]  Name: [name] MRN: [id number] Date of Birth: 1933-09-19

## 2017-12-10 ENCOUNTER — Telehealth (HOSPITAL_COMMUNITY): Payer: Self-pay | Admitting: Physical Therapy

## 2017-12-10 ENCOUNTER — Ambulatory Visit (HOSPITAL_COMMUNITY): Payer: Medicare HMO | Admitting: Physical Therapy

## 2017-12-10 DIAGNOSIS — M25672 Stiffness of left ankle, not elsewhere classified: Secondary | ICD-10-CM | POA: Diagnosis not present

## 2017-12-10 DIAGNOSIS — M25572 Pain in left ankle and joints of left foot: Secondary | ICD-10-CM | POA: Diagnosis not present

## 2017-12-10 NOTE — Therapy (Signed)
[name], [place], [address] Phone: [phone]   Fax:  [phone]  Physical Therapy Treatment  Patient Details  Name: [name] MRN: [id number] Date of Birth: 01-09-1934 Referring Provider (PT): Max Hyatt    Encounter Date: 12/10/2017  PT End of Session - 12/10/17 1054    Visit Number  4    Number of Visits  12    Date for PT Re-Evaluation  12/28/17    Authorization Type  AETNA    Authorization - Visit Number  4    Authorization - Number of Visits  12    PT Start Time  3570    PT Stop Time  1120    PT Time Calculation (min)  42 min    Activity Tolerance  Patient tolerated treatment well    Behavior During Therapy  [facility] for tasks assessed/performed       Past Medical History:  Diagnosis Date  . Abrasion of arm, left    secondary to fall   . Anemia   . Arthritis   . Breast cancer, left ([place])    er/pr +  . Cataract    no surgery as yet,starting of 3 years  . Chronic kidney disease    renal stones, one episode of Lithotripsy  . Cirrhosis (HCC)    Metavir score 4  . Complication of anesthesia   . Diabetes mellitus   . GERD (gastroesophageal reflux disease)   . Hyperlipidemia   . Hypertension    followed by [facility]. relative to  urology study grp.  Doesn't report ever having a stress test   . Mental disorder   . Multiple falls   . Nocturia   . PONV (postoperative nausea and vomiting)   . Seasonal allergies   . Skin cancer    face- melanoma, 2012- surg. excision      Past Surgical History:  Procedure Laterality Date  . ABDOMINAL HYSTERECTOMY    . BACK SURGERY     x4 back surgery to include rods & screws  . Last in May 2014  . BREAST LUMPECTOMY WITH NEEDLE LOCALIZATION AND AXILLARY SENTINEL LYMPH NODE BX  03/12/2012   Procedure: BREAST LUMPECTOMY WITH NEEDLE LOCALIZATION AND AXILLARY SENTINEL LYMPH NODE BX;  Surgeon: Merrie Roof, MD;  Location: [place];  Service: General;   Laterality: Left;  . BREAST SURGERY     lumpectomy x2/left   . CHOLECYSTECTOMY    . COLONOSCOPY N/A 11/20/2013   Dr.Rourk- diverticula was found in the left colon. o/w normal rectal, colonic and terminal ileal mucosa  . ESOPHAGOGASTRODUODENOSCOPY N/A 11/20/2013   Dr.Rourk- abnormal mucosa was found. diffuse snake skinning and friability of the gastric mucosa. patent pylorus. abnormal-appearing first, second and third portion of the duodenum. 45mm gastric nodule at the GE junction. stomach bx= minimal chronic infammation, GE junction bx= polypoid fragments of gastric cardia type mucosa w/mod chronic inflammation and foveolar hyperplasia  . EYE SURGERY     cataract surgery bilat   . FRACTURE SURGERY     1975- ORIF- L ankle   . GIVENS CAPSULE STUDY N/A 02/24/2014   Procedure: GIVENS CAPSULE STUDY;  Surgeon: Daneil Dolin, MD;  [facility];  Service: Endoscopy;  Laterality: N/A;  . RE-EXCISION OF BREAST LUMPECTOMY  03/22/2012   Procedure: RE-EXCISION OF BREAST LUMPECTOMY;  Surgeon: Merrie Roof, MD;  Location: [place];  Service: General;  Laterality: Left;  re-excision left  breast lumpectomy cavity  ER+, PR+  . ROTATOR CUFF REPAIR Bilateral   . SHOULDER SURGERY    . SKIN BIOPSY    . TOTAL KNEE ARTHROPLASTY Right 12/18/2014   Procedure: RIGHT TOTAL KNEE ARTHROPLASTY;  Surgeon: Sydnee Cabal, MD;  [facility];  Service: Orthopedics;  Laterality: Right;  . TUBAL LIGATION      There were no vitals filed for this visit.  Subjective Assessment - 12/10/17 1050    Subjective  PT states she has a better understanding of the ABC exercise now, just not alot of motion in her ankle and it's stiff.  States her pain is better overall in her Lt ankle and her swelling is much improved.     Currently in Pain?  Yes    Pain Score  5     Pain Location  Ankle    Pain Orientation  Left    Pain Descriptors / Indicators  Aching    Pain Type  Chronic pain                        [facility] Adult PT Treatment/Exercise - 12/10/17 0001      Manual Therapy   Manual Therapy  Compression Bandaging    Manual therapy comments  completed at [facility] from all other skilled interventions    Compression Bandaging  for Lt distal LE using 1/2" foam and multilayer compression bandaging.       Ankle Exercises: Seated   ABC's  1 rep    ABC's Limitations  AA for first 1/2 followed by I     Towel Inversion/Eversion  Limitations   both ways for ROM 10 reps   Heel Raises  15 reps    Toe Raise  15 reps             PT Education - 12/10/17 1052    Education Details  order for juxta refaxed to MD for signature.  Educated to continue with wearing garment, remove and reroll before next appt.    Person(s) Educated  Patient    Methods  Explanation    Comprehension  Verbalized understanding       PT Short Term Goals - 12/06/17 1304      PT SHORT TERM GOAL #1   Title  PT Lt DF to improve to 3 degrees dorsiflexion to allow pt to have a more normalized gait.     Time  2    Period  Weeks    Status  On-going      PT SHORT TERM GOAL #2   Title  Core strength improved 1/2 grade to allow pt to stand more erect to take stress of both her back and her ankle.     Time  2    Period  Weeks    Status  On-going      PT SHORT TERM GOAL #3   Title  Pt to be able to walk for 10 mintues with her rollator.     Time  2    Period  Weeks    Status  On-going      PT SHORT TERM GOAL #4   Title  Pt edema to be decreased by 1.5 cm to allow improved flexibility     Time  2    Period  Weeks    Status  Achieved        PT Long Term Goals - 12/06/17 1305      PT LONG  TERM GOAL #1   Title  Pt Dorsiflexion on left ankle to have increased to 8 degrees to allow normal motion of ankle while walking     Time  4    Period  Weeks    Status  On-going      PT LONG TERM GOAL #2   Title  PT Pain in her left ankle to be no greater than a 4/10 to allow pt to be  up for 15 mintues at a time with her rollator to complete household tasks.     Time  4    Period  Weeks    Status  On-going      PT LONG TERM GOAL #3   Title  PT to have lost 2-3 cm of volume from Lt LE to allow pt to don compression garment with greater ease.     Time  4    Period  Weeks    Status  Achieved      PT LONG TERM GOAL #4   Title  Pt to understand that she needs to wear compression garments on a regular basis to prevent increased edema.     Time  4    Period  Weeks    Status  Achieved      PT LONG TERM GOAL #5   Title  PT to be able to single leg stance for 5 seconds bilaterally to decrease risk of falling     Time  4    Period  Weeks    Status  On-going      PT LONG TERM GOAL #6   Title  Pt Lt ankle strength to be at least a 4+/5 to allow improved stability while walking     Time  4    Period  Weeks    Status  On-going            Plan - 12/10/17 1056    Clinical Impression Statement  Pt arrived today reporting overall satisfaction with reduction in edema of Lt LE.  signed order not received so therapist faxed again to MD. Added towel inversion/eversion to work on ROM and pt with improved understanding and performance with ankle alphabet actvitiy.  continued with bandaing for Lt LE.     Rehab Potential  Good    PT Frequency  3x / week    PT Duration  4 weeks    PT Treatment/Interventions  ADLs/Self Care Home Management;Therapeutic exercise;Balance training;Neuromuscular re-education;Cognitive remediation;Patient/family education;Manual techniques;Manual lymph drainage;Compression bandaging;Passive range of motion    PT Next Visit Plan  Next session begin balance exercises, i.e. narrow base of support, tandem.  Also begin heel raises and standing stretches..     PT Home Exercise Plan  ABC, Sitting Dorsi/plantarflexion, supine ROM exercises.     Consulted and Agree with Plan of Care  Patient       Patient will benefit from skilled therapeutic intervention in  order to improve the following deficits and impairments:  Abnormal gait, Decreased activity tolerance, Decreased balance, Decreased mobility, Decreased range of motion, Decreased strength, Difficulty walking, Hypomobility, Increased edema, Postural dysfunction, Obesity  Visit Diagnosis: Pain in left ankle and joints of left foot  Stiffness of left ankle, not elsewhere classified     Problem List Patient Active Problem List   Diagnosis Date Noted  . Altered mental status 08/18/2017  . Acute encephalopathy 08/17/2017  . Encephalopathy acute 08/17/2017  . Encephalopathy 07/25/2017  . Diabetes mellitus ([place]) 09/02/2015  . Anemia due  to multiple mechanisms 02/17/2015  . Chronic LBP 02/17/2015  . Impaired renal function 02/17/2015  . Abducens nerve weakness 02/17/2015  . S/P knee replacement 12/18/2014  . Osteoarthritis of right knee 12/18/2014  . IDA (iron deficiency anemia) 10/29/2013  . Vitamin B12 deficiency anemia 09/09/2013  . Anemia, iron deficiency 09/09/2013  . Lumbago 08/06/2013  . Difficulty in walking(719.7) 08/06/2013  . Osteopenia 03/11/2013  . Malignant neoplasm of lower-inner quadrant of female breast ([place]) 03/10/2013  . Bilateral leg weakness 09/23/2012  . Acute renal failure ([place]) 07/25/2012  . Dehydration 07/25/2012  . DM type 2, uncontrolled, with neuropathy ([place]) 07/25/2012  . DM (diabetes mellitus), type 2, uncontrolled ([place]) 07/25/2012  . Essential hypertension, benign 07/25/2012  . Generalized weakness 07/25/2012  . Hyperlipidemia   . Chronic kidney disease   . Skin cancer   . Hypertension   . GERD (gastroesophageal reflux disease)   . Breast cancer ([place]) 02/19/2012  . CAP (community acquired pneumonia) 04/19/2011  . Nausea and vomiting 04/19/2011  . HTN (hypertension) 04/19/2011  . DM type 2 (diabetes mellitus, type 2) ([place]) 04/19/2011  . Arthritis 04/19/2011   Teena Irani, PTA/CLT [phone]  Teena Irani 12/10/2017, 3:13 PM  [facility] [address] [place], [place], [address] Phone: [phone]   Fax:  [phone]  Name: DARLINA MCCAUGHEY MRN: [id number] Date of Birth: 03-03-1933

## 2017-12-12 ENCOUNTER — Ambulatory Visit (HOSPITAL_COMMUNITY): Payer: Medicare HMO | Admitting: Physical Therapy

## 2017-12-12 DIAGNOSIS — M25672 Stiffness of left ankle, not elsewhere classified: Secondary | ICD-10-CM | POA: Diagnosis not present

## 2017-12-12 DIAGNOSIS — M25572 Pain in left ankle and joints of left foot: Secondary | ICD-10-CM | POA: Diagnosis not present

## 2017-12-12 NOTE — Therapy (Signed)
[place], [place], [address] Phone: [phone]   Fax:  [phone]  Physical Therapy Treatment  Patient Details  Name: [name] MRN: [id number] Date of Birth: Jan 30, 1934 Referring Provider (PT): Max Hyatt    Encounter Date: 12/12/2017  PT End of Session - 12/12/17 1340    Visit Number  5    Number of Visits  12    Date for PT Re-Evaluation  12/28/17    Authorization Type  AETNA    Authorization - Visit Number  5    Authorization - Number of Visits  12    PT Start Time  1033    PT Stop Time  1118    PT Time Calculation (min)  45 min    Activity Tolerance  Patient tolerated treatment well    Behavior During Therapy  [facility] for tasks assessed/performed       Past Medical History:  Diagnosis Date  . Abrasion of arm, left    secondary to fall   . Anemia   . Arthritis   . Breast cancer, left ([place])    er/pr +  . Cataract    no surgery as yet,starting of 3 years  . Chronic kidney disease    renal stones, one episode of Lithotripsy  . Cirrhosis (HCC)    Metavir score 4  . Complication of anesthesia   . Diabetes mellitus   . GERD (gastroesophageal reflux disease)   . Hyperlipidemia   . Hypertension    followed by [place] gr[facility]. relative to  urology study grp.  Doesn't report ever having a stress test   . Mental disorder   . Multiple falls   . Nocturia   . PONV (postoperative nausea and vomiting)   . Seasonal allergies   . Skin cancer    face- melanoma, 2012- surg. excision      Past Surgical History:  Procedure Laterality Date  . ABDOMINAL HYSTERECTOMY    . BACK SURGERY     x4 back surgery to include rods & screws  . Last in May 2014  . BREAST LUMPECTOMY WITH NEEDLE LOCALIZATION AND AXILLARY SENTINEL LYMPH NODE BX  03/12/2012   Procedure: BREAST LUMPECTOMY WITH NEEDLE LOCALIZATION AND AXILLARY SENTINEL LYMPH NODE BX;  Surgeon: Merrie Roof, MD;  Location: [facility];  Service: General;   Laterality: Left;  . BREAST SURGERY     lumpectomy x2/left   . CHOLECYSTECTOMY    . COLONOSCOPY N/A 11/20/2013   Dr.Rourk- diverticula was found in the left colon. o/w normal rectal, colonic and terminal ileal mucosa  . ESOPHAGOGASTRODUODENOSCOPY N/A 11/20/2013   Dr.Rourk- abnormal mucosa was found. diffuse snake skinning and friability of the gastric mucosa. patent pylorus. abnormal-appearing first, second and third portion of the duodenum. 34mm gastric nodule at the GE junction. stomach bx= minimal chronic infammation, GE junction bx= polypoid fragments of gastric cardia type mucosa w/mod chronic inflammation and foveolar hyperplasia  . EYE SURGERY     cataract surgery bilat   . FRACTURE SURGERY     1975- ORIF- L ankle   . GIVENS CAPSULE STUDY N/A 02/24/2014   Procedure: GIVENS CAPSULE STUDY;  Surgeon: Daneil Dolin, MD;  [facility];  Service: Endoscopy;  Laterality: N/A;  . RE-EXCISION OF BREAST LUMPECTOMY  03/22/2012   Procedure: RE-EXCISION OF BREAST LUMPECTOMY;  Surgeon: Merrie Roof, MD;  Location: [facility];  Service: General;  Laterality: Left;  re-excision left  breast lumpectomy cavity  ER+, PR+  . ROTATOR CUFF REPAIR Bilateral   . SHOULDER SURGERY    . SKIN BIOPSY    . TOTAL KNEE ARTHROPLASTY Right 12/18/2014   Procedure: RIGHT TOTAL KNEE ARTHROPLASTY;  Surgeon: Sydnee Cabal, MD;  [facility];  Service: Orthopedics;  Laterality: Right;  . TUBAL LIGATION      There were no vitals filed for this visit.  Subjective Assessment - 12/12/17 1330    Subjective  Pt states her ankle is doing Okay but still feeling stiff.  PT states she removed her bandages last night ot take her bath. ORder returned signed for juxtas.    Currently in Pain?  No/denies            LYMPHEDEMA/ONCOLOGY QUESTIONNAIRE - 12/12/17 1455      Left Lower Extremity Lymphedema   At Midpatella/Popliteal Crease  45.3 cm    30 cm Proximal to Floor at Lateral Plantar Foot   42.5 cm    20 cm Proximal to Floor at Lateral Plantar Foot  32.8 cm    10 cm Proximal to Floor at Lateral Malleoli  25.4 cm    Circumference of ankle/heel  31.7 cm.    5 cm Proximal to 1st MTP Joint  24 cm    Across MTP Joint  22.2 cm    Other  around medial/lateral malleoli 28.5                [facility] Adult PT Treatment/Exercise - 12/12/17 0001      Manual Therapy   Manual Therapy  Compression Bandaging    Manual therapy comments  completed at EOS seperate from all other skilled interventions    Compression Bandaging  for Lt distal LE using 1/2" foam and multilayer compression bandaging.       Ankle Exercises: Standing   SLS  narrow base bil without UE 30"X2    Rebounder  tandem stance 30" each LE with intermittent HHA    Heel Raises  Both;15 reps    Toe Raise  15 reps      Ankle Exercises: Stretches   Slant Board Stretch  3 reps;30 seconds               PT Short Term Goals - 12/06/17 1304      PT SHORT TERM GOAL #1   Title  PT Lt DF to improve to 3 degrees dorsiflexion to allow pt to have a more normalized gait.     Time  2    Period  Weeks    Status  On-going      PT SHORT TERM GOAL #2   Title  Core strength improved 1/2 grade to allow pt to stand more erect to take stress of both her back and her ankle.     Time  2    Period  Weeks    Status  On-going      PT SHORT TERM GOAL #3   Title  Pt to be able to walk for 10 mintues with her rollator.     Time  2    Period  Weeks    Status  On-going      PT SHORT TERM GOAL #4   Title  Pt edema to be decreased by 1.5 cm to allow improved flexibility     Time  2    Period  Weeks    Status  Achieved        PT Long Term Goals -  12/06/17 1305      PT LONG TERM GOAL #1   Title  Pt Dorsiflexion on left ankle to have increased to 8 degrees to allow normal motion of ankle while walking     Time  4    Period  Weeks    Status  On-going      PT LONG TERM GOAL #2   Title  PT Pain in her left ankle to be no  greater than a 4/10 to allow pt to be up for 15 mintues at a time with her rollator to complete household tasks.     Time  4    Period  Weeks    Status  On-going      PT LONG TERM GOAL #3   Title  PT to have lost 2-3 cm of volume from Lt LE to allow pt to don compression garment with greater ease.     Time  4    Period  Weeks    Status  Achieved      PT LONG TERM GOAL #4   Title  Pt to understand that she needs to wear compression garments on a regular basis to prevent increased edema.     Time  4    Period  Weeks    Status  Achieved      PT LONG TERM GOAL #5   Title  PT to be able to single leg stance for 5 seconds bilaterally to decrease risk of falling     Time  4    Period  Weeks    Status  On-going      PT LONG TERM GOAL #6   Title  Pt Lt ankle strength to be at least a 4+/5 to allow improved stability while walking     Time  4    Period  Weeks    Status  On-going            Plan - 12/12/17 1341    Clinical Impression Statement  Began standing stab and strengthening exercises this session with cues for correct mm usage and posturing with exercises.  Pt with most difficutly maintaining balance in tandem greater than 8 seconds.  Measured LE today with measures remaining the same at 2 most proximal points and slightly elevated at 2 distal points.  Measurements also taken around medial/lateral malleoli as this is the largest area and into foot.   PT given order and instructions to call [facility] for appt to obtain juxtas.  ended with rebandaging to Lt LE,    Rehab Potential  Good    PT Frequency  3x / week    PT Duration  4 weeks    PT Treatment/Interventions  ADLs/Self Care Home Management;Therapeutic exercise;Balance training;Neuromuscular re-education;Cognitive remediation;Patient/family education;Manual techniques;Manual lymph drainage;Compression bandaging;Passive range of motion    PT Next Visit Plan  Next session progress balance exercises with addition of SLS and  vector stance.      PT Home Exercise Plan  ABC, Sitting Dorsi/plantarflexion, supine ROM exercises.     Consulted and Agree with Plan of Care  Patient       Patient will benefit from skilled therapeutic intervention in order to improve the following deficits and impairments:  Abnormal gait, Decreased activity tolerance, Decreased balance, Decreased mobility, Decreased range of motion, Decreased strength, Difficulty walking, Hypomobility, Increased edema, Postural dysfunction, Obesity  Visit Diagnosis: Pain in left ankle and joints of left foot  Stiffness of left ankle, not elsewhere classified  Problem List Patient Active Problem List   Diagnosis Date Noted  . Altered mental status 08/18/2017  . Acute encephalopathy 08/17/2017  . Encephalopathy acute 08/17/2017  . Encephalopathy 07/25/2017  . Diabetes mellitus ([place]) 09/02/2015  . Anemia due to multiple mechanisms 02/17/2015  . Chronic LBP 02/17/2015  . Impaired renal function 02/17/2015  . Abducens nerve weakness 02/17/2015  . S/P knee replacement 12/18/2014  . Osteoarthritis of right knee 12/18/2014  . IDA (iron deficiency anemia) 10/29/2013  . Vitamin B12 deficiency anemia 09/09/2013  . Anemia, iron deficiency 09/09/2013  . Lumbago 08/06/2013  . Difficulty in walking(719.7) 08/06/2013  . Osteopenia 03/11/2013  . Malignant neoplasm of lower-inner quadrant of female breast ([place]) 03/10/2013  . Bilateral leg weakness 09/23/2012  . Acute renal failure ([place]) 07/25/2012  . Dehydration 07/25/2012  . DM type 2, uncontrolled, with neuropathy ([place]) 07/25/2012  . DM (diabetes mellitus), type 2, uncontrolled ([place]) 07/25/2012  . Essential hypertension, benign 07/25/2012  . Generalized weakness 07/25/2012  . Hyperlipidemia   . Chronic kidney disease   . Skin cancer   . Hypertension   . GERD (gastroesophageal reflux disease)   . Breast cancer ([place]) 02/19/2012  . CAP (community acquired pneumonia) 04/19/2011  . Nausea and  vomiting 04/19/2011  . HTN (hypertension) 04/19/2011  . DM type 2 (diabetes mellitus, type 2) ([place]) 04/19/2011  . Arthritis 04/19/2011   Teena Irani, PTA/CLT [phone]  Teena Irani 12/12/2017, 3:01 PM  [place] [address] [place], [place], [address] Phone: [phone]   Fax:  [phone]  Name: XIANA CARNS MRN: [id number] Date of Birth: Apr 30, 1933

## 2017-12-14 ENCOUNTER — Ambulatory Visit (HOSPITAL_COMMUNITY): Payer: Medicare HMO | Admitting: Physical Therapy

## 2017-12-14 DIAGNOSIS — M25572 Pain in left ankle and joints of left foot: Secondary | ICD-10-CM | POA: Diagnosis not present

## 2017-12-14 DIAGNOSIS — M25672 Stiffness of left ankle, not elsewhere classified: Secondary | ICD-10-CM

## 2017-12-14 NOTE — Therapy (Signed)
[place], [place], [address] Phone: [phone]   Fax:  [phone]  Physical Therapy Treatment  Patient Details  Name: [name] MRN: [id number] Date of Birth: 06-27-1933 Referring Provider (PT): Max Hyatt    Encounter Date: 12/14/2017  PT End of Session - 12/14/17 1150    Visit Number  6    Number of Visits  12    Date for PT Re-Evaluation  12/28/17    Authorization Type  AETNA    Authorization - Visit Number  6    Authorization - Number of Visits  12    PT Start Time  1120    PT Stop Time  1200    PT Time Calculation (min)  40 min    Activity Tolerance  Patient tolerated treatment well    Behavior During Therapy  [facility] for tasks assessed/performed       Past Medical History:  Diagnosis Date  . Abrasion of arm, left    secondary to fall   . Anemia   . Arthritis   . Breast cancer, left ([place])    er/pr +  . Cataract    no surgery as yet,starting of 3 years  . Chronic kidney disease    renal stones, one episode of Lithotripsy  . Cirrhosis (HCC)    Metavir score 4  . Complication of anesthesia   . Diabetes mellitus   . GERD (gastroesophageal reflux disease)   . Hyperlipidemia   . Hypertension    followed by [place] [facility]. relative to  urology study grp.  Doesn't report ever having a stress test   . Mental disorder   . Multiple falls   . Nocturia   . PONV (postoperative nausea and vomiting)   . Seasonal allergies   . Skin cancer    face- melanoma, 2012- surg. excision      Past Surgical History:  Procedure Laterality Date  . ABDOMINAL HYSTERECTOMY    . BACK SURGERY     x4 back surgery to include rods & screws  . Last in May 2014  . BREAST LUMPECTOMY WITH NEEDLE LOCALIZATION AND AXILLARY SENTINEL LYMPH NODE BX  03/12/2012   Procedure: BREAST LUMPECTOMY WITH NEEDLE LOCALIZATION AND AXILLARY SENTINEL LYMPH NODE BX;  Surgeon: Merrie Roof, MD;  Location: [facility];  Service: General;   Laterality: Left;  . BREAST SURGERY     lumpectomy x2/left   . CHOLECYSTECTOMY    . COLONOSCOPY N/A 11/20/2013   Dr.Rourk- diverticula was found in the left colon. o/w normal rectal, colonic and terminal ileal mucosa  . ESOPHAGOGASTRODUODENOSCOPY N/A 11/20/2013   Dr.Rourk- abnormal mucosa was found. diffuse snake skinning and friability of the gastric mucosa. patent pylorus. abnormal-appearing first, second and third portion of the duodenum. 26mm gastric nodule at the GE junction. stomach bx= minimal chronic infammation, GE junction bx= polypoid fragments of gastric cardia type mucosa w/mod chronic inflammation and foveolar hyperplasia  . EYE SURGERY     cataract surgery bilat   . FRACTURE SURGERY     1975- ORIF- L ankle   . GIVENS CAPSULE STUDY N/A 02/24/2014   Procedure: GIVENS CAPSULE STUDY;  Surgeon: Daneil Dolin, MD;  [facility];  Service: Endoscopy;  Laterality: N/A;  . RE-EXCISION OF BREAST LUMPECTOMY  03/22/2012   Procedure: RE-EXCISION OF BREAST LUMPECTOMY;  Surgeon: Merrie Roof, MD;  Location: [facility];  Service: General;  Laterality: Left;  re-excision left  breast lumpectomy cavity  ER+, PR+  . ROTATOR CUFF REPAIR Bilateral   . SHOULDER SURGERY    . SKIN BIOPSY    . TOTAL KNEE ARTHROPLASTY Right 12/18/2014   Procedure: RIGHT TOTAL KNEE ARTHROPLASTY;  Surgeon: Sydnee Cabal, MD;  [facility];  Service: Orthopedics;  Laterality: Right;  . TUBAL LIGATION      There were no vitals filed for this visit.  Subjective Assessment - 12/14/17 1142    Subjective  Pt states that she has an appointment to be fitted for her juxtalite on Tuesday.  Pt has been trying to do her exercises at home     Currently in Pain?  Yes    Pain Score  5     Pain Location  Ankle    Pain Orientation  Left    Pain Descriptors / Indicators  Aching;Tightness    Pain Type  Chronic pain    Pain Onset  More than a month ago    Pain Frequency  Intermittent    Aggravating  Factors   weight bearing     Pain Relieving Factors  rest     Effect of Pain on Daily Activities  limits          [facility] PT Assessment - 12/14/17 0001      AROM   Left Ankle Dorsiflexion  0    Left Ankle Plantar Flexion  50    Left Ankle Inversion  13    Left Ankle Eversion  25                   [facility] Adult PT Treatment/Exercise - 12/14/17 0001      Exercises   Exercises  Ankle      Manual Therapy   Manual Therapy  Compression Bandaging    Manual therapy comments  completed at EOS seperate from all other skilled interventions    Compression Bandaging  for Lt distal LE using 1/2" foam and multilayer compression bandaging.       Ankle Exercises: Stretches   Slant Board Stretch  --      Ankle Exercises: Standing   SLS  --    Rebounder  tandem stance 30" each LE with intermittent HHA    Heel Raises  Both;15 reps    Toe Raise  15 reps      Ankle Exercises: Seated   Towel Crunch  Limitations    Towel Crunch Limitations  2' unable to crunch towel under.      BAPS  Sitting;Level 3;10 reps    BAPS Limitations  10 reps all directions including clockwise and counter clockwise       Ankle Exercises: Supine   Other Supine Ankle Exercises  ROM  x 10               PT Short Term Goals - 12/14/17 1203      PT SHORT TERM GOAL #1   Title  PT Lt DF to improve to 3 degrees dorsiflexion to allow pt to have a more normalized gait.     Time  2    Period  Weeks    Status  Achieved      PT SHORT TERM GOAL #2   Title  Core strength improved 1/2 grade to allow pt to stand more erect to take stress of both her back and her ankle.     Time  2    Period  Weeks    Status  On-going  PT SHORT TERM GOAL #3   Title  Pt to be able to walk for 10 mintues with her rollator.     Time  2    Period  Weeks    Status  On-going      PT SHORT TERM GOAL #4   Title  Pt edema to be decreased by 1.5 cm to allow improved flexibility     Time  2    Period  Weeks    Status   Achieved        PT Long Term Goals - 12/14/17 1204      PT LONG TERM GOAL #1   Title  Pt Dorsiflexion on left ankle to have increased to 8 degrees to allow normal motion of ankle while walking     Time  4    Period  Weeks    Status  On-going      PT LONG TERM GOAL #2   Title  PT Pain in her left ankle to be no greater than a 4/10 to allow pt to be up for 15 mintues at a time with her rollator to complete household tasks.     Time  4    Period  Weeks    Status  On-going      PT LONG TERM GOAL #3   Title  PT to have lost 2-3 cm of volume from Lt LE to allow pt to don compression garment with greater ease.     Time  4    Period  Weeks    Status  Achieved      PT LONG TERM GOAL #4   Title  Pt to understand that she needs to wear compression garments on a regular basis to prevent increased edema.     Time  4    Period  Weeks    Status  Achieved      PT LONG TERM GOAL #5   Title  PT to be able to single leg stance for 5 seconds bilaterally to decrease risk of falling     Time  4    Period  Weeks    Status  On-going      PT LONG TERM GOAL #6   Title  Pt Lt ankle strength to be at least a 4+/5 to allow improved stability while walking     Time  4    Period  Weeks    Status  On-going            Plan - 12/14/17 1150    Clinical Impression Statement  Pt has improved ROM and decreased swelling. Pain, ROM, strength and balance still need to improve therefore pt will continue to benefit from skilled physical therapy .    Rehab Potential  Good    PT Frequency  3x / week    PT Duration  4 weeks    PT Treatment/Interventions  ADLs/Self Care Home Management;Therapeutic exercise;Balance training;Neuromuscular re-education;Cognitive remediation;Patient/family education;Manual techniques;Manual lymph drainage;Compression bandaging;Passive range of motion    PT Next Visit Plan  Next session progress strength with t-band exercises.  Hold single leg balance until pt is able to stand  erect with tandem and narrow based activitites.     PT Home Exercise Plan  ABC, Sitting Dorsi/plantarflexion, supine ROM exercises.     Consulted and Agree with Plan of Care  Patient       Patient will benefit from skilled therapeutic intervention in order to improve the following deficits and impairments:  Abnormal gait, Decreased activity tolerance, Decreased balance, Decreased mobility, Decreased range of motion, Decreased strength, Difficulty walking, Hypomobility, Increased edema, Postural dysfunction, Obesity  Visit Diagnosis: Pain in left ankle and joints of left foot  Stiffness of left ankle, not elsewhere classified     Problem List Patient Active Problem List   Diagnosis Date Noted  . Altered mental status 08/18/2017  . Acute encephalopathy 08/17/2017  . Encephalopathy acute 08/17/2017  . Encephalopathy 07/25/2017  . Diabetes mellitus ([place]) 09/02/2015  . Anemia due to multiple mechanisms 02/17/2015  . Chronic LBP 02/17/2015  . Impaired renal function 02/17/2015  . Abducens nerve weakness 02/17/2015  . S/P knee replacement 12/18/2014  . Osteoarthritis of right knee 12/18/2014  . IDA (iron deficiency anemia) 10/29/2013  . Vitamin B12 deficiency anemia 09/09/2013  . Anemia, iron deficiency 09/09/2013  . Lumbago 08/06/2013  . Difficulty in walking(719.7) 08/06/2013  . Osteopenia 03/11/2013  . Malignant neoplasm of lower-inner quadrant of female breast ([place]) 03/10/2013  . Bilateral leg weakness 09/23/2012  . Acute renal failure ([place]) 07/25/2012  . Dehydration 07/25/2012  . DM type 2, uncontrolled, with neuropathy ([place]) 07/25/2012  . DM (diabetes mellitus), type 2, uncontrolled ([place]) 07/25/2012  . Essential hypertension, benign 07/25/2012  . Generalized weakness 07/25/2012  . Hyperlipidemia   . Chronic kidney disease   . Skin cancer   . Hypertension   . GERD (gastroesophageal reflux disease)   . Breast cancer ([place]) 02/19/2012  . CAP (community acquired pneumonia)  04/19/2011  . Nausea and vomiting 04/19/2011  . HTN (hypertension) 04/19/2011  . DM type 2 (diabetes mellitus, type 2) ([place]) 04/19/2011  . Arthritis 04/19/2011    Rayetta Humphrey, PT CLT [phone] 12/14/2017, 12:05 PM  [facility] [address] [place], [place], [address] Phone: [phone]   Fax:  [phone]  Name: DENIM START MRN: [id number] Date of Birth: Nov 19, 1933

## 2017-12-17 ENCOUNTER — Ambulatory Visit (HOSPITAL_COMMUNITY): Payer: Medicare HMO | Admitting: Physical Therapy

## 2017-12-17 DIAGNOSIS — M25672 Stiffness of left ankle, not elsewhere classified: Secondary | ICD-10-CM

## 2017-12-17 DIAGNOSIS — M25572 Pain in left ankle and joints of left foot: Secondary | ICD-10-CM | POA: Diagnosis not present

## 2017-12-17 NOTE — Therapy (Signed)
[place], [place], [address] Phone: [phone]   Fax:  [phone]  Physical Therapy Treatment  Patient Details  Name: [name] MRN: [id number] Date of Birth: 1933/03/04 Referring Provider (PT): Max Hyatt    Encounter Date: 12/17/2017  PT End of Session - 12/17/17 1446    Visit Number  7    Number of Visits  12    Date for PT Re-Evaluation  12/28/17    Authorization Type  AETNA    Authorization - Visit Number  7    Authorization - Number of Visits  12    PT Start Time  3846    PT Stop Time  1158    PT Time Calculation (min)  40 min    Activity Tolerance  Patient tolerated treatment well    Behavior During Therapy  [facility] for tasks assessed/performed       Past Medical History:  Diagnosis Date  . Abrasion of arm, left    secondary to fall   . Anemia   . Arthritis   . Breast cancer, left ([place])    er/pr +  . Cataract    no surgery as yet,starting of 3 years  . Chronic kidney disease    renal stones, one episode of Lithotripsy  . Cirrhosis (HCC)    Metavir score 4  . Complication of anesthesia   . Diabetes mellitus   . GERD (gastroesophageal reflux disease)   . Hyperlipidemia   . Hypertension    followed by [facility]. relative to  urology study grp.  Doesn't report ever having a stress test   . Mental disorder   . Multiple falls   . Nocturia   . PONV (postoperative nausea and vomiting)   . Seasonal allergies   . Skin cancer    face- melanoma, 2012- surg. excision      Past Surgical History:  Procedure Laterality Date  . ABDOMINAL HYSTERECTOMY    . BACK SURGERY     x4 back surgery to include rods & screws  . Last in May 2014  . BREAST LUMPECTOMY WITH NEEDLE LOCALIZATION AND AXILLARY SENTINEL LYMPH NODE BX  03/12/2012   Procedure: BREAST LUMPECTOMY WITH NEEDLE LOCALIZATION AND AXILLARY SENTINEL LYMPH NODE BX;  Surgeon: Merrie Roof, MD;  Location: [facility];  Service: General;   Laterality: Left;  . BREAST SURGERY     lumpectomy x2/left   . CHOLECYSTECTOMY    . COLONOSCOPY N/A 11/20/2013   Dr.Rourk- diverticula was found in the left colon. o/w normal rectal, colonic and terminal ileal mucosa  . ESOPHAGOGASTRODUODENOSCOPY N/A 11/20/2013   Dr.Rourk- abnormal mucosa was found. diffuse snake skinning and friability of the gastric mucosa. patent pylorus. abnormal-appearing first, second and third portion of the duodenum. 40mm gastric nodule at the GE junction. stomach bx= minimal chronic infammation, GE junction bx= polypoid fragments of gastric cardia type mucosa w/mod chronic inflammation and foveolar hyperplasia  . EYE SURGERY     cataract surgery bilat   . FRACTURE SURGERY     1975- ORIF- L ankle   . GIVENS CAPSULE STUDY N/A 02/24/2014   Procedure: GIVENS CAPSULE STUDY;  Surgeon: Daneil Dolin, MD;  [facility];  Service: Endoscopy;  Laterality: N/A;  . RE-EXCISION OF BREAST LUMPECTOMY  03/22/2012   Procedure: RE-EXCISION OF BREAST LUMPECTOMY;  Surgeon: Merrie Roof, MD;  Location: [facility];  Service: General;  Laterality: Left;  re-excision left  breast lumpectomy cavity  ER+, PR+  . ROTATOR CUFF REPAIR Bilateral   . SHOULDER SURGERY    . SKIN BIOPSY    . TOTAL KNEE ARTHROPLASTY Right 12/18/2014   Procedure: RIGHT TOTAL KNEE ARTHROPLASTY;  Surgeon: Sydnee Cabal, MD;  [facility];  Service: Orthopedics;  Laterality: Right;  . TUBAL LIGATION      There were no vitals filed for this visit.  Subjective Assessment - 12/17/17 1124    Subjective  PT reports no pain or issues today.  STates she goes tomorrow for her juxta fitting to [employer].      Currently in Pain?  No/denies                       [facility] Adult PT Treatment/Exercise - 12/17/17 0001      Manual Therapy   Manual Therapy  Compression Bandaging    Manual therapy comments  completed at [facility] seperate from all other skilled interventions    Compression  Bandaging  for Lt distal LE using 1/2" foam and multilayer compression bandaging.       Ankle Exercises: Stretches   Plantar Fascia Stretch  2 reps;30 seconds    Slant Board Stretch  3 reps;30 seconds      Ankle Exercises: Standing   BAPS  Standing;Level 2;10 reps;Limitations    Rebounder  tandem stance 30" each LE with intermittent HHA    Heel Raises  Both;15 reps    Toe Raise  15 reps               PT Short Term Goals - 12/14/17 1203      PT SHORT TERM GOAL #1   Title  PT Lt DF to improve to 3 degrees dorsiflexion to allow pt to have a more normalized gait.     Time  2    Period  Weeks    Status  Achieved      PT SHORT TERM GOAL #2   Title  Core strength improved 1/2 grade to allow pt to stand more erect to take stress of both her back and her ankle.     Time  2    Period  Weeks    Status  On-going      PT SHORT TERM GOAL #3   Title  Pt to be able to walk for 10 mintues with her rollator.     Time  2    Period  Weeks    Status  On-going      PT SHORT TERM GOAL #4   Title  Pt edema to be decreased by 1.5 cm to allow improved flexibility     Time  2    Period  Weeks    Status  Achieved        PT Long Term Goals - 12/14/17 1204      PT LONG TERM GOAL #1   Title  Pt Dorsiflexion on left ankle to have increased to 8 degrees to allow normal motion of ankle while walking     Time  4    Period  Weeks    Status  On-going      PT LONG TERM GOAL #2   Title  PT Pain in her left ankle to be no greater than a 4/10 to allow pt to be up for 15 mintues at a time with her rollator to complete household tasks.     Time  4    Period  Weeks    Status  On-going      PT LONG TERM GOAL #3   Title  PT to have lost 2-3 cm of volume from Lt LE to allow pt to don compression garment with greater ease.     Time  4    Period  Weeks    Status  Achieved      PT LONG TERM GOAL #4   Title  Pt to understand that she needs to wear compression garments on a regular basis to  prevent increased edema.     Time  4    Period  Weeks    Status  Achieved      PT LONG TERM GOAL #5   Title  PT to be able to single leg stance for 5 seconds bilaterally to decrease risk of falling     Time  4    Period  Weeks    Status  On-going      PT LONG TERM GOAL #6   Title  Pt Lt ankle strength to be at least a 4+/5 to allow improved stability while walking     Time  4    Period  Weeks    Status  On-going            Plan - 12/17/17 1502    Clinical Impression Statement  Pt reports improving overall and goes for her juxta fitting tomorrow.  Added BAPS today, however difficulty moving board without hips/knees and hands, especially with rotation (CW/CCW).  Added plantar fascia stretch as well today to help improve dorsiflexion.   Removed compression bandaging, cleansed, moisturized and rebandaged with overall comfort reported.     Rehab Potential  Good    PT Frequency  3x / week    PT Duration  4 weeks    PT Treatment/Interventions  ADLs/Self Care Home Management;Therapeutic exercise;Balance training;Neuromuscular re-education;Cognitive remediation;Patient/family education;Manual techniques;Manual lymph drainage;Compression bandaging;Passive range of motion    PT Next Visit Plan  Next session Continue to progress overall strength/ROM of ankle. Begin tband exercises.      PT Home Exercise Plan  ABC, Sitting Dorsi/plantarflexion, supine ROM exercises.     Consulted and Agree with Plan of Care  Patient       Patient will benefit from skilled therapeutic intervention in order to improve the following deficits and impairments:  Abnormal gait, Decreased activity tolerance, Decreased balance, Decreased mobility, Decreased range of motion, Decreased strength, Difficulty walking, Hypomobility, Increased edema, Postural dysfunction, Obesity  Visit Diagnosis: Pain in left ankle and joints of left foot  Stiffness of left ankle, not elsewhere classified     Problem List Patient  Active Problem List   Diagnosis Date Noted  . Altered mental status 08/18/2017  . Acute encephalopathy 08/17/2017  . Encephalopathy acute 08/17/2017  . Encephalopathy 07/25/2017  . Diabetes mellitus ([place]) 09/02/2015  . Anemia due to multiple mechanisms 02/17/2015  . Chronic LBP 02/17/2015  . Impaired renal function 02/17/2015  . Abducens nerve weakness 02/17/2015  . S/P knee replacement 12/18/2014  . Osteoarthritis of right knee 12/18/2014  . IDA (iron deficiency anemia) 10/29/2013  . Vitamin B12 deficiency anemia 09/09/2013  . Anemia, iron deficiency 09/09/2013  . Lumbago 08/06/2013  . Difficulty in walking(719.7) 08/06/2013  . Osteopenia 03/11/2013  . Malignant neoplasm of lower-inner quadrant of female breast ([place]) 03/10/2013  . Bilateral leg weakness 09/23/2012  . Acute renal failure ([place]) 07/25/2012  . Dehydration 07/25/2012  . DM type 2, uncontrolled, with neuropathy ([place])  07/25/2012  . DM (diabetes mellitus), type 2, uncontrolled ([place]) 07/25/2012  . Essential hypertension, benign 07/25/2012  . Generalized weakness 07/25/2012  . Hyperlipidemia   . Chronic kidney disease   . Skin cancer   . Hypertension   . GERD (gastroesophageal reflux disease)   . Breast cancer ([place]) 02/19/2012  . CAP (community acquired pneumonia) 04/19/2011  . Nausea and vomiting 04/19/2011  . HTN (hypertension) 04/19/2011  . DM type 2 (diabetes mellitus, type 2) ([place]) 04/19/2011  . Arthritis 04/19/2011   Teena Irani, PTA/CLT [phone]  Teena Irani 12/17/2017, 3:33 PM  [facility] [address] [place], [place], [address] Phone: [phone]   Fax:  [phone]  Name: [name] MRN: [id number] Date of Birth: [birth year]

## 2017-12-18 DIAGNOSIS — I890 Lymphedema, not elsewhere classified: Secondary | ICD-10-CM | POA: Diagnosis not present

## 2017-12-19 ENCOUNTER — Ambulatory Visit (HOSPITAL_COMMUNITY): Payer: Medicare HMO | Admitting: Physical Therapy

## 2017-12-19 DIAGNOSIS — M25572 Pain in left ankle and joints of left foot: Secondary | ICD-10-CM | POA: Diagnosis not present

## 2017-12-19 DIAGNOSIS — M25672 Stiffness of left ankle, not elsewhere classified: Secondary | ICD-10-CM

## 2017-12-19 NOTE — Therapy (Signed)
[place], [place], [address] Phone: [phone]   Fax:  [phone]  Physical Therapy Treatment  Patient Details  Name: [name] MRN: [id number] Date of Birth: 04-03-1933 Referring Provider (PT): Max Hyatt    Encounter Date: 12/19/2017  PT End of Session - 12/19/17 1116    Visit Number  8    Number of Visits  12    Date for PT Re-Evaluation  12/28/17    Authorization Type  AETNA    Authorization - Visit Number  8    Authorization - Number of Visits  12    PT Start Time  5456    PT Stop Time  1114    PT Time Calculation (min)  44 min    Activity Tolerance  Patient tolerated treatment well    Behavior During Therapy  [facility] for tasks assessed/performed       Past Medical History:  Diagnosis Date  . Abrasion of arm, left    secondary to fall   . Anemia   . Arthritis   . Breast cancer, left ([place])    er/pr +  . Cataract    no surgery as yet,starting of 3 years  . Chronic kidney disease    renal stones, one episode of Lithotripsy  . Cirrhosis (HCC)    Metavir score 4  . Complication of anesthesia   . Diabetes mellitus   . GERD (gastroesophageal reflux disease)   . Hyperlipidemia   . Hypertension    followed by  grp. relative to  urology study grp.  Doesn't report ever having a stress test   . Mental disorder   . Multiple falls   . Nocturia   . PONV (postoperative nausea and vomiting)   . Seasonal allergies   . Skin cancer    face- melanoma, 2012- surg. excision      Past Surgical History:  Procedure Laterality Date  . ABDOMINAL HYSTERECTOMY    . BACK SURGERY     x4 back surgery to include rods & screws  . Last in May 2014  . BREAST LUMPECTOMY WITH NEEDLE LOCALIZATION AND AXILLARY SENTINEL LYMPH NODE BX  03/12/2012   Procedure: BREAST LUMPECTOMY WITH NEEDLE LOCALIZATION AND AXILLARY SENTINEL LYMPH NODE BX;  Surgeon: Merrie Roof, MD;  Location: [facility];  Service: General;   Laterality: Left;  . BREAST SURGERY     lumpectomy x2/left   . CHOLECYSTECTOMY    . COLONOSCOPY N/A 11/20/2013   Dr.Rourk- diverticula was found in the left colon. o/w normal rectal, colonic and terminal ileal mucosa  . ESOPHAGOGASTRODUODENOSCOPY N/A 11/20/2013   Dr.Rourk- abnormal mucosa was found. diffuse snake skinning and friability of the gastric mucosa. patent pylorus. abnormal-appearing first, second and third portion of the duodenum. 72mm gastric nodule at the GE junction. stomach bx= minimal chronic infammation, GE junction bx= polypoid fragments of gastric cardia type mucosa w/mod chronic inflammation and foveolar hyperplasia  . EYE SURGERY     cataract surgery bilat   . FRACTURE SURGERY     1975- ORIF- L ankle   . GIVENS CAPSULE STUDY N/A 02/24/2014   Procedure: GIVENS CAPSULE STUDY;  Surgeon: Daneil Dolin, MD;  [facility];  Service: Endoscopy;  Laterality: N/A;  . RE-EXCISION OF BREAST LUMPECTOMY  03/22/2012   Procedure: RE-EXCISION OF BREAST LUMPECTOMY;  Surgeon: Merrie Roof, MD;  Location: [facility];  Service: General;  Laterality: Left;  re-excision left  breast lumpectomy cavity  ER+, PR+  . ROTATOR CUFF REPAIR Bilateral   . SHOULDER SURGERY    . SKIN BIOPSY    . TOTAL KNEE ARTHROPLASTY Right 12/18/2014   Procedure: RIGHT TOTAL KNEE ARTHROPLASTY;  Surgeon: Sydnee Cabal, MD;  [facility];  Service: Orthopedics;  Laterality: Right;  . TUBAL LIGATION      There were no vitals filed for this visit.  Subjective Assessment - 12/19/17 1037    Subjective  PT comes in with juxtalite donned.  States that it is comfortable.  She has been doing her ankle exercises and she can tell a difference     Pertinent History  Hx 1975 having Lt ankle surgery, DM, HTN, skin cancer, LBP, acute renal failure UTI's     Limitations  Standing;Walking;Lifting;House hold activities    How long can you sit comfortably?  no problem     How long can you stand  comfortably?  hurts back therefore limited to five minutes     How long can you walk comfortably?  uses a rollator able to walk about 5 minutes .    Patient Stated Goals  less pain, to be able to walk better.     Currently in Pain?  Yes    Pain Score  5     Pain Location  Ankle    Pain Orientation  Left    Pain Descriptors / Indicators  Aching    Pain Type  Chronic pain    Pain Onset  More than a month ago    Pain Frequency  Intermittent    Aggravating Factors   wt bearing     Pain Relieving Factors  rest     Effect of Pain on Daily Activities  just has to be careful             [facility] Adult PT Treatment/Exercise - 12/19/17 0001      Exercises   Exercises  Ankle      Ankle Exercises: Stretches   Plantar Fascia Stretch  2 reps;30 seconds    Slant Board Stretch  3 reps;30 seconds      Ankle Exercises: Standing   SLS  narrow base bil without UE 30"X2   with head turns and head nods x 5 @   Rebounder  tandem stance 10" each LE x 3 no hand hold     Heel Raises  Both;15 reps    Toe Raise  15 reps      Ankle Exercises: Seated   BAPS  Sitting;Level 3;10 reps    BAPS Limitations  10 reps all directions including clockwise and counter clockwise       Ankle Exercises: Supine   T-Band  all directions red x 10                PT Short Term Goals - 12/14/17 1203      PT SHORT TERM GOAL #1   Title  PT Lt DF to improve to 3 degrees dorsiflexion to allow pt to have a more normalized gait.     Time  2    Period  Weeks    Status  Achieved      PT SHORT TERM GOAL #2   Title  Core strength improved 1/2 grade to allow pt to stand more erect to take stress of both her back and her ankle.     Time  2    Period  Weeks    Status  On-going  PT SHORT TERM GOAL #3   Title  Pt to be able to walk for 10 mintues with her rollator.     Time  2    Period  Weeks    Status  On-going      PT SHORT TERM GOAL #4   Title  Pt edema to be decreased by 1.5 cm to allow improved  flexibility     Time  2    Period  Weeks    Status  Achieved        PT Long Term Goals - 12/14/17 1204      PT LONG TERM GOAL #1   Title  Pt Dorsiflexion on left ankle to have increased to 8 degrees to allow normal motion of ankle while walking     Time  4    Period  Weeks    Status  On-going      PT LONG TERM GOAL #2   Title  PT Pain in her left ankle to be no greater than a 4/10 to allow pt to be up for 15 mintues at a time with her rollator to complete household tasks.     Time  4    Period  Weeks    Status  On-going      PT LONG TERM GOAL #3   Title  PT to have lost 2-3 cm of volume from Lt LE to allow pt to don compression garment with greater ease.     Time  4    Period  Weeks    Status  Achieved      PT LONG TERM GOAL #4   Title  Pt to understand that she needs to wear compression garments on a regular basis to prevent increased edema.     Time  4    Period  Weeks    Status  Achieved      PT LONG TERM GOAL #5   Title  PT to be able to single leg stance for 5 seconds bilaterally to decrease risk of falling     Time  4    Period  Weeks    Status  On-going      PT LONG TERM GOAL #6   Title  Pt Lt ankle strength to be at least a 4+/5 to allow improved stability while walking     Time  4    Period  Weeks    Status  On-going            Plan - 12/19/17 1117    Clinical Impression Statement  Added theraband exercises using red t-band with fatigue and weakness noted.  Pt continues to improve in ROM.  No compression bandaging needed as pt has acquired her juxtalite at this time.      Rehab Potential  Good    PT Frequency  3x / week    PT Duration  4 weeks    PT Treatment/Interventions  ADLs/Self Care Home Management;Therapeutic exercise;Balance training;Neuromuscular re-education;Cognitive remediation;Patient/family education;Manual techniques;Manual lymph drainage;Compression bandaging;Passive range of motion    PT Next Visit Plan  If pt is comfortable give  t-band for HEP.    PT Home Exercise Plan  ABC, Sitting Dorsi/plantarflexion, supine ROM exercises.     Consulted and Agree with Plan of Care  Patient       Patient will benefit from skilled therapeutic intervention in order to improve the following deficits and impairments:  Abnormal gait, Decreased activity tolerance, Decreased balance, Decreased mobility, Decreased  range of motion, Decreased strength, Difficulty walking, Hypomobility, Increased edema, Postural dysfunction, Obesity  Visit Diagnosis: Pain in left ankle and joints of left foot  Stiffness of left ankle, not elsewhere classified     Problem List Patient Active Problem List   Diagnosis Date Noted  . Altered mental status 08/18/2017  . Acute encephalopathy 08/17/2017  . Encephalopathy acute 08/17/2017  . Encephalopathy 07/25/2017  . Diabetes mellitus ([place]) 09/02/2015  . Anemia due to multiple mechanisms 02/17/2015  . Chronic LBP 02/17/2015  . Impaired renal function 02/17/2015  . Abducens nerve weakness 02/17/2015  . S/P knee replacement 12/18/2014  . Osteoarthritis of right knee 12/18/2014  . IDA (iron deficiency anemia) 10/29/2013  . Vitamin B12 deficiency anemia 09/09/2013  . Anemia, iron deficiency 09/09/2013  . Lumbago 08/06/2013  . Difficulty in walking(719.7) 08/06/2013  . Osteopenia 03/11/2013  . Malignant neoplasm of lower-inner quadrant of female breast ([place]) 03/10/2013  . Bilateral leg weakness 09/23/2012  . Acute renal failure ([place]) 07/25/2012  . Dehydration 07/25/2012  . DM type 2, uncontrolled, with neuropathy ([place]) 07/25/2012  . DM (diabetes mellitus), type 2, uncontrolled ([place]) 07/25/2012  . Essential hypertension, benign 07/25/2012  . Generalized weakness 07/25/2012  . Hyperlipidemia   . Chronic kidney disease   . Skin cancer   . Hypertension   . GERD (gastroesophageal reflux disease)   . Breast cancer ([place]) 02/19/2012  . CAP (community acquired pneumonia) 04/19/2011  . Nausea and  vomiting 04/19/2011  . HTN (hypertension) 04/19/2011  . DM type 2 (diabetes mellitus, type 2) ([place]) 04/19/2011  . Arthritis 04/19/2011    Rayetta Humphrey, PT CLT [phone] 12/19/2017, 11:19 AM  [facility] [address] [place], [place], [address] Phone: [phone]   Fax:  [phone]  Name: EMERA BUSSIE MRN: [id number] Date of Birth: [birth year]

## 2017-12-21 ENCOUNTER — Encounter (HOSPITAL_COMMUNITY): Payer: Self-pay

## 2017-12-24 ENCOUNTER — Ambulatory Visit (HOSPITAL_COMMUNITY): Payer: Medicare HMO | Admitting: Physical Therapy

## 2017-12-24 DIAGNOSIS — M25672 Stiffness of left ankle, not elsewhere classified: Secondary | ICD-10-CM | POA: Diagnosis not present

## 2017-12-24 DIAGNOSIS — M25572 Pain in left ankle and joints of left foot: Secondary | ICD-10-CM | POA: Diagnosis not present

## 2017-12-24 NOTE — Therapy (Signed)
[place], [place], [address] Phone: [phone]   Fax:  [phone]  Physical Therapy Treatment  Patient Details  Name: [name] MRN: [id number] Date of Birth: 1933/12/01 Referring Provider (PT): Max Hyatt    Encounter Date: 12/24/2017  PT End of Session - 12/24/17 1202    Visit Number  9    Number of Visits  12    Date for PT Re-Evaluation  12/28/17    Authorization Type  AETNA    Authorization - Visit Number  9    Authorization - Number of Visits  12    PT Start Time  0973    PT Stop Time  1113    PT Time Calculation (min)  41 min    Activity Tolerance  Patient tolerated treatment well    Behavior During Therapy  [facility] for tasks assessed/performed       Past Medical History:  Diagnosis Date  . Abrasion of arm, left    secondary to fall   . Anemia   . Arthritis   . Breast cancer, left ([place])    er/pr +  . Cataract    no surgery as yet,starting of 3 years  . Chronic kidney disease    renal stones, one episode of Lithotripsy  . Cirrhosis (HCC)    Metavir score 4  . Complication of anesthesia   . Diabetes mellitus   . GERD (gastroesophageal reflux disease)   . Hyperlipidemia   . Hypertension    followed by [place] grp. relative to  urology study grp.  Doesn't report ever having a stress test   . Mental disorder   . Multiple falls   . Nocturia   . PONV (postoperative nausea and vomiting)   . Seasonal allergies   . Skin cancer    face- melanoma, 2012- surg. excision      Past Surgical History:  Procedure Laterality Date  . ABDOMINAL HYSTERECTOMY    . BACK SURGERY     x4 back surgery to include rods & screws  . Last in May 2014  . BREAST LUMPECTOMY WITH NEEDLE LOCALIZATION AND AXILLARY SENTINEL LYMPH NODE BX  03/12/2012   Procedure: BREAST LUMPECTOMY WITH NEEDLE LOCALIZATION AND AXILLARY SENTINEL LYMPH NODE BX;  Surgeon: Merrie Roof, MD;  Location: [place];  Service: General;   Laterality: Left;  . BREAST SURGERY     lumpectomy x2/left   . CHOLECYSTECTOMY    . COLONOSCOPY N/A 11/20/2013   Dr.Rourk- diverticula was found in the left colon. o/w normal rectal, colonic and terminal ileal mucosa  . ESOPHAGOGASTRODUODENOSCOPY N/A 11/20/2013   Dr.Rourk- abnormal mucosa was found. diffuse snake skinning and friability of the gastric mucosa. patent pylorus. abnormal-appearing first, second and third portion of the duodenum. 44mm gastric nodule at the GE junction. stomach bx= minimal chronic infammation, GE junction bx= polypoid fragments of gastric cardia type mucosa w/mod chronic inflammation and foveolar hyperplasia  . EYE SURGERY     cataract surgery bilat   . FRACTURE SURGERY     1975- ORIF- L ankle   . GIVENS CAPSULE STUDY N/A 02/24/2014   Procedure: GIVENS CAPSULE STUDY;  Surgeon: Daneil Dolin, MD;  [facility];  Service: Endoscopy;  Laterality: N/A;  . RE-EXCISION OF BREAST LUMPECTOMY  03/22/2012   Procedure: RE-EXCISION OF BREAST LUMPECTOMY;  Surgeon: Merrie Roof, MD;  Location: [facility];  Service: General;  Laterality: Left;  re-excision left  breast lumpectomy cavity  ER+, PR+  . ROTATOR CUFF REPAIR Bilateral   . SHOULDER SURGERY    . SKIN BIOPSY    . TOTAL KNEE ARTHROPLASTY Right 12/18/2014   Procedure: RIGHT TOTAL KNEE ARTHROPLASTY;  Surgeon: Sydnee Cabal, MD;  [facility];  Service: Orthopedics;  Laterality: Right;  . TUBAL LIGATION      There were no vitals filed for this visit.  Subjective Assessment - 12/24/17 1038    Subjective  Pt continues to wear juxtalite with overall comfort reported.  No issues today other than Lt ankle pain.  Rates at 6/10    Currently in Pain?  Yes    Pain Score  6     Pain Location  Ankle    Pain Orientation  Left    Pain Descriptors / Indicators  Aching    Pain Type  Chronic pain                       [facility] Adult PT Treatment/Exercise - 12/24/17 0001      Ankle  Exercises: Standing   Vector Stance  Right;Left;5 reps;Other (comment);Limitations   3" holds   SLS  narrow base bil without UE 30"X2    Heel Raises  Both;15 reps    Toe Raise  15 reps    Other Standing Ankle Exercises  tandem stance intermittent HHA 30" each      Ankle Exercises: Seated   Towel Crunch  Limitations    BAPS  Sitting;Level 3;10 reps    BAPS Limitations  10 reps all directions including clockwise and counter clockwise     Other Seated Ankle Exercises  theraband RTB 10 reps each      Additional Ankle Exercises DO NOT USE   Towel Crunch Limitations  2' unable to crunch towel under.                 PT Short Term Goals - 12/14/17 1203      PT SHORT TERM GOAL #1   Title  PT Lt DF to improve to 3 degrees dorsiflexion to allow pt to have a more normalized gait.     Time  2    Period  Weeks    Status  Achieved      PT SHORT TERM GOAL #2   Title  Core strength improved 1/2 grade to allow pt to stand more erect to take stress of both her back and her ankle.     Time  2    Period  Weeks    Status  On-going      PT SHORT TERM GOAL #3   Title  Pt to be able to walk for 10 mintues with her rollator.     Time  2    Period  Weeks    Status  On-going      PT SHORT TERM GOAL #4   Title  Pt edema to be decreased by 1.5 cm to allow improved flexibility     Time  2    Period  Weeks    Status  Achieved        PT Long Term Goals - 12/14/17 1204      PT LONG TERM GOAL #1   Title  Pt Dorsiflexion on left ankle to have increased to 8 degrees to allow normal motion of ankle while walking     Time  4    Period  Weeks    Status  On-going  PT LONG TERM GOAL #2   Title  PT Pain in her left ankle to be no greater than a 4/10 to allow pt to be up for 15 mintues at a time with her rollator to complete household tasks.     Time  4    Period  Weeks    Status  On-going      PT LONG TERM GOAL #3   Title  PT to have lost 2-3 cm of volume from Lt LE to allow pt to don  compression garment with greater ease.     Time  4    Period  Weeks    Status  Achieved      PT LONG TERM GOAL #4   Title  Pt to understand that she needs to wear compression garments on a regular basis to prevent increased edema.     Time  4    Period  Weeks    Status  Achieved      PT LONG TERM GOAL #5   Title  PT to be able to single leg stance for 5 seconds bilaterally to decrease risk of falling     Time  4    Period  Weeks    Status  On-going      PT LONG TERM GOAL #6   Title  Pt Lt ankle strength to be at least a 4+/5 to allow improved stability while walking     Time  4    Period  Weeks    Status  On-going            Plan - 12/24/17 1159    Clinical Impression Statement  Continued with ankle strengthening/stab exercises.  Noted weakness with all actvities.  Pt unable to stand on one LE without Bilateral UE assist.  Theraband exercises completed for all except DF, however pateint with diffiuclty completing independently.  will need review and additional instruction.  PT reported feeling stronger overall    Rehab Potential  Good    PT Frequency  3x / week    PT Duration  4 weeks    PT Treatment/Interventions  ADLs/Self Care Home Management;Therapeutic exercise;Balance training;Neuromuscular re-education;Cognitive remediation;Patient/family education;Manual techniques;Manual lymph drainage;Compression bandaging;Passive range of motion    PT Next Visit Plan  continue to improve ankle strength and stabiltiy. re-evaluate    PT Home Exercise Plan  ABC, Sitting Dorsi/plantarflexion, supine ROM exercises.     Consulted and Agree with Plan of Care  Patient       Patient will benefit from skilled therapeutic intervention in order to improve the following deficits and impairments:  Abnormal gait, Decreased activity tolerance, Decreased balance, Decreased mobility, Decreased range of motion, Decreased strength, Difficulty walking, Hypomobility, Increased edema, Postural  dysfunction, Obesity  Visit Diagnosis: Stiffness of left ankle, not elsewhere classified  Pain in left ankle and joints of left foot     Problem List Patient Active Problem List   Diagnosis Date Noted  . Altered mental status 08/18/2017  . Acute encephalopathy 08/17/2017  . Encephalopathy acute 08/17/2017  . Encephalopathy 07/25/2017  . Diabetes mellitus ([place]) 09/02/2015  . Anemia due to multiple mechanisms 02/17/2015  . Chronic LBP 02/17/2015  . Impaired renal function 02/17/2015  . Abducens nerve weakness 02/17/2015  . S/P knee replacement 12/18/2014  . Osteoarthritis of right knee 12/18/2014  . IDA (iron deficiency anemia) 10/29/2013  . Vitamin B12 deficiency anemia 09/09/2013  . Anemia, iron deficiency 09/09/2013  . Lumbago 08/06/2013  . Difficulty in  walking(719.7) 08/06/2013  . Osteopenia 03/11/2013  . Malignant neoplasm of lower-inner quadrant of female breast ([place]) 03/10/2013  . Bilateral leg weakness 09/23/2012  . Acute renal failure ([place]) 07/25/2012  . Dehydration 07/25/2012  . DM type 2, uncontrolled, with neuropathy ([place]) 07/25/2012  . DM (diabetes mellitus), type 2, uncontrolled ([place]) 07/25/2012  . Essential hypertension, benign 07/25/2012  . Generalized weakness 07/25/2012  . Hyperlipidemia   . Chronic kidney disease   . Skin cancer   . Hypertension   . GERD (gastroesophageal reflux disease)   . Breast cancer ([place]) 02/19/2012  . CAP (community acquired pneumonia) 04/19/2011  . Nausea and vomiting 04/19/2011  . HTN (hypertension) 04/19/2011  . DM type 2 (diabetes mellitus, type 2) ([employer]) 04/19/2011  . Arthritis 04/19/2011   Teena Irani, PTA/CLT [phone]  Teena Irani 12/24/2017, 12:03 PM  [facility] [address] [place], [place], [address] Phone: [phone]   Fax:  [phone]  Name: [name] MRN: [id number] Date of Birth: [birth year]

## 2017-12-26 ENCOUNTER — Encounter (HOSPITAL_COMMUNITY): Payer: Self-pay | Admitting: Physical Therapy

## 2017-12-26 ENCOUNTER — Ambulatory Visit (HOSPITAL_COMMUNITY): Payer: Medicare HMO | Admitting: Physical Therapy

## 2017-12-26 DIAGNOSIS — M25572 Pain in left ankle and joints of left foot: Secondary | ICD-10-CM

## 2017-12-26 DIAGNOSIS — M25672 Stiffness of left ankle, not elsewhere classified: Secondary | ICD-10-CM

## 2017-12-26 NOTE — Therapy (Signed)
[place], [place], [address] Phone: [phone]   Fax:  [phone]  Physical Therapy Treatment  Patient Details  Name: [name] MRN: [id number] Date of Birth: 04-Jul-1933 Referring Provider (PT): Max Hyatt    Encounter Date: 12/26/2017  PT End of Session - 12/26/17 1145    Visit Number  10    Number of Visits  18    Date for PT Re-Evaluation  12/28/17    Authorization Type  AETNA    Authorization - Visit Number  10    Authorization - Number of Visits  18    PT Start Time  6767    PT Stop Time  1144    PT Time Calculation (min)  47 min    Activity Tolerance  Patient tolerated treatment well    Behavior During Therapy  [facility] for tasks assessed/performed       Past Medical History:  Diagnosis Date  . Abrasion of arm, left    secondary to fall   . Anemia   . Arthritis   . Breast cancer, left ([facility])    er/pr +  . Cataract    no surgery as yet,starting of 3 years  . Chronic kidney disease    renal stones, one episode of Lithotripsy  . Cirrhosis (HCC)    Metavir score 4  . Complication of anesthesia   . Diabetes mellitus   . GERD (gastroesophageal reflux disease)   . Hyperlipidemia   . Hypertension    followed by [place] grp. relative to  urology study grp.  Doesn't report ever having a stress test   . Mental disorder   . Multiple falls   . Nocturia   . PONV (postoperative nausea and vomiting)   . Seasonal allergies   . Skin cancer    face- melanoma, 2012- surg. excision      Past Surgical History:  Procedure Laterality Date  . ABDOMINAL HYSTERECTOMY    . BACK SURGERY     x4 back surgery to include rods & screws  . Last in May 2014  . BREAST LUMPECTOMY WITH NEEDLE LOCALIZATION AND AXILLARY SENTINEL LYMPH NODE BX  03/12/2012   Procedure: BREAST LUMPECTOMY WITH NEEDLE LOCALIZATION AND AXILLARY SENTINEL LYMPH NODE BX;  Surgeon: Merrie Roof, MD;  Location: [facility];  Service: General;   Laterality: Left;  . BREAST SURGERY     lumpectomy x2/left   . CHOLECYSTECTOMY    . COLONOSCOPY N/A 11/20/2013   Dr.Rourk- diverticula was found in the left colon. o/w normal rectal, colonic and terminal ileal mucosa  . ESOPHAGOGASTRODUODENOSCOPY N/A 11/20/2013   Dr.Rourk- abnormal mucosa was found. diffuse snake skinning and friability of the gastric mucosa. patent pylorus. abnormal-appearing first, second and third portion of the duodenum. 20m gastric nodule at the GE junction. stomach bx= minimal chronic infammation, GE junction bx= polypoid fragments of gastric cardia type mucosa w/mod chronic inflammation and foveolar hyperplasia  . EYE SURGERY     cataract surgery bilat   . FRACTURE SURGERY     1975- ORIF- L ankle   . GIVENS CAPSULE STUDY N/A 02/24/2014   Procedure: GIVENS CAPSULE STUDY;  Surgeon: RDaneil Dolin MD;  [facility];  Service: Endoscopy;  Laterality: N/A;  . RE-EXCISION OF BREAST LUMPECTOMY  03/22/2012   Procedure: RE-EXCISION OF BREAST LUMPECTOMY;  Surgeon: PMerrie Roof MD;  [place] [place] [place]  Service: General;  Laterality: Left;  re-excision left  breast lumpectomy cavity  ER+, PR+  . ROTATOR CUFF REPAIR Bilateral   . SHOULDER SURGERY    . SKIN BIOPSY    . TOTAL KNEE ARTHROPLASTY Right 12/18/2014   Procedure: RIGHT TOTAL KNEE ARTHROPLASTY;  Surgeon: Sydnee Cabal, MD;  [facility];  Service: Orthopedics;  Laterality: Right;  . TUBAL LIGATION      There were no vitals filed for this visit.  Subjective Assessment - 12/26/17 1101    Subjective  PT states that she really likes her juxtalite.  She is working on some of her exercises everyday she has no questions on the theraband exercises.   PT feels that her ankle is at least 25% percent better.     Pertinent History  Hx 1975 having Lt ankle surgery, DM, HTN, skin cancer, LBP, acute renal failure UTI's     Limitations  Standing;Walking;Lifting;House hold activities    How long can  you sit comfortably?  no problem     How long can you stand comfortably?  hurts back therefore limited to five minutes     How long can you walk comfortably?  uses a rollator able to walk about 5 minutes .    Patient Stated Goals  less pain, to be able to walk better.     Currently in Pain?  No/denies   in the past week pain has gone up to a 6/10    Pain Onset  More than a month ago    Aggravating Factors   not sure    Pain Relieving Factors  ice     Effect of Pain on Daily Activities  limits when she has pain          [facility] PT Assessment - 12/26/17 0001      Assessment   Medical Diagnosis  LT ankle pain/difficulty walking     Referring Provider (PT)  Max Hyatt     Onset Date/Surgical Date  08/27/17    Next MD Visit  01/22/2018    Prior Therapy  none for this reason      Precautions   Precautions  Fall      Restrictions   Weight Bearing Restrictions  No      Balance Screen   Has the patient fallen in the past 6 months  No    Has the patient had a decrease in activity level because of a fear of falling?   Yes    Is the patient reluctant to leave their home because of a fear of falling?   No      Home Environment   Living Environment  Private residence    Type of [place] to enter    Entrance Stairs-Number of Steps  2    [place]  One level      Prior Function   Level of [place]  Retired    Leisure  read      Cognition   Overall Cognitive Status  Within Functional Limits for tasks assessed      Observation/Other Assessments   Focus on Therapeutic Outcomes (FOTO)   53      Observation/Other Assessments-Edema    Edema  --      Functional Tests   Functional tests  Single leg stance;Sit to Stand      Single Leg Stance   Comments  RT:  0  LT:  0  Sit to Stand   Comments  5x 29.15 was 24.71   Pt back is bothering her today      AROM   Right Ankle Dorsiflexion  8    Right Ankle Plantar Flexion  60     Right Ankle Inversion  20    Right Ankle Eversion  25    Left Ankle Dorsiflexion  4   was -3    Left Ankle Plantar Flexion  50   was 40   Left Ankle Inversion  18   was 10    Left Ankle Eversion  20   was 20     Strength   Right Ankle Dorsiflexion  4+/5   was 4+   Right Ankle Plantar Flexion  4+/5   was 4+    Right Ankle Inversion  4/5    Right Ankle Eversion  3/5    Left Ankle Dorsiflexion  4+/5   was 4   Left Ankle Plantar Flexion  4+/5   was 4+   Left Ankle Inversion  4-/5    Left Ankle Eversion  3-/5      Ambulation/Gait   Gait Comments  Pt walks forward bent weight is no longer on forearms but on hands.        LYMPHEDEMA/ONCOLOGY QUESTIONNAIRE - 12/26/17 1106      Left Lower Extremity Lymphedema   At Midpatella/Popliteal Crease  44 cm   was 45.7   30 cm Proximal to Floor at Lateral Plantar Foot  41.5 cm   was 44.3    20 cm Proximal to Floor at Lateral Plantar Foot  33.5 cm    10 cm Proximal to Floor at Lateral Malleoli  27 cm    Circumference of ankle/heel  32 cm.                [facility] Adult PT Treatment/Exercise - 12/26/17 0001      Exercises   Exercises  Ankle      Ankle Exercises: Standing   Heel Raises  15 reps      Ankle Exercises: Seated   BAPS  Sitting;Level 3;10 reps    BAPS Limitations  10 reps all directions including clockwise and counter clockwise     Other Seated Ankle Exercises  seated DF with 4# x 10       Ankle Exercises: Sidelying   Ankle Inversion  10 reps    Ankle Eversion  10 reps               PT Short Term Goals - 12/26/17 1124      PT SHORT TERM GOAL #1   Title  PT Lt DF to improve to 3 degrees dorsiflexion to allow pt to have a more normalized gait.     Time  2    Period  Weeks    Status  Achieved      PT SHORT TERM GOAL #2   Title  Core strength improved 1/2 grade to allow pt to stand more erect to take stress of both her back and her ankle.     Time  2    Period  Weeks    Status  On-going       PT SHORT TERM GOAL #3   Title  Pt to be able to walk for 10 mintues with her rollator.     Time  2    Period  Weeks    Status  Achieved      PT SHORT TERM  GOAL #4   Title  Pt edema to be decreased by 1.5 cm to allow improved flexibility     Time  2    Period  Weeks    Status  Achieved        PT Long Term Goals - 12/26/17 1125      PT LONG TERM GOAL #1   Title  Pt Dorsiflexion on left ankle to have increased to 8 degrees to allow normal motion of ankle while walking     Time  4    Period  Weeks    Status  On-going      PT LONG TERM GOAL #2   Title  PT Pain in her left ankle to be no greater than a 4/10 to allow pt to be up for 15 mintues at a time with her rollator to complete household tasks.     Time  4    Period  Weeks    Status  Achieved      PT LONG TERM GOAL #3   Title  PT to have lost 2-3 cm of volume from Lt LE to allow pt to don compression garment with greater ease.     Time  4    Period  Weeks    Status  Achieved      PT LONG TERM GOAL #4   Title  Pt to understand that she needs to wear compression garments on a regular basis to prevent increased edema.     Time  4    Period  Weeks    Status  Achieved      PT LONG TERM GOAL #5   Title  PT to be able to single leg stance for 5 seconds bilaterally to decrease risk of falling     Time  4    Period  Weeks    Status  On-going      PT LONG TERM GOAL #6   Title  Pt Lt ankle strength to be at least a 4+/5 to allow improved stability while walking     Time  4    Period  Weeks    Status  On-going            Plan - 12/26/17 1148    Clinical Impression Statement  Tenth visit progress note completed with noted improvement in ROM.  Pt has met 3/4 STG and 3/5 LTG.  Pt still has limitations in strength and balance.  Pt will continue to benefit from skilled therapy to improve her functional ability and decrease her risk of falling.    Rehab Potential  Good    PT Frequency  3x / week    PT Duration  4 weeks    now add 2x a week for 3 more weeks to maximize functioning.  Total of 7 weeks of therapy    PT Treatment/Interventions  ADLs/Self Care Home Management;Therapeutic exercise;Balance training;Neuromuscular re-education;Cognitive remediation;Patient/family education;Manual techniques;Manual lymph drainage;Compression bandaging;Passive range of motion    PT Next Visit Plan  continue to improve ankle strength and stabiltiy avoid single leg activity due to weakened core and LE     PT Home Exercise Plan  ABC, Sitting Dorsi/plantarflexion, supine ROM exercises. T band exercises     Consulted and Agree with Plan of Care  Patient       Patient will benefit from skilled therapeutic intervention in order to improve the following deficits and impairments:  Abnormal gait, Decreased activity tolerance, Decreased balance, Decreased mobility, Decreased range  of motion, Decreased strength, Difficulty walking, Hypomobility, Increased edema, Postural dysfunction, Obesity  Visit Diagnosis: Stiffness of left ankle, not elsewhere classified - Plan: PT plan of care cert/re-cert  Pain in left ankle and joints of left foot - Plan: PT plan of care cert/re-cert     Problem List Patient Active Problem List   Diagnosis Date Noted  . Altered mental status 08/18/2017  . Acute encephalopathy 08/17/2017  . Encephalopathy acute 08/17/2017  . Encephalopathy 07/25/2017  . Diabetes mellitus ([place]) 09/02/2015  . Anemia due to multiple mechanisms 02/17/2015  . Chronic LBP 02/17/2015  . Impaired renal function 02/17/2015  . Abducens nerve weakness 02/17/2015  . S/P knee replacement 12/18/2014  . Osteoarthritis of right knee 12/18/2014  . IDA (iron deficiency anemia) 10/29/2013  . Vitamin B12 deficiency anemia 09/09/2013  . Anemia, iron deficiency 09/09/2013  . Lumbago 08/06/2013  . Difficulty in walking(719.7) 08/06/2013  . Osteopenia 03/11/2013  . Malignant neoplasm of lower-inner quadrant of female breast ([place])  03/10/2013  . Bilateral leg weakness 09/23/2012  . Acute renal failure ([place]) 07/25/2012  . Dehydration 07/25/2012  . DM type 2, uncontrolled, with neuropathy ([place]) 07/25/2012  . DM (diabetes mellitus), type 2, uncontrolled ([place]) 07/25/2012  . Essential hypertension, benign 07/25/2012  . Generalized weakness 07/25/2012  . Hyperlipidemia   . Chronic kidney disease   . Skin cancer   . Hypertension   . GERD (gastroesophageal reflux disease)   . Breast cancer ([place]) 02/19/2012  . CAP (community acquired pneumonia) 04/19/2011  . Nausea and vomiting 04/19/2011  . HTN (hypertension) 04/19/2011  . DM type 2 (diabetes mellitus, type 2) ([place]) 04/19/2011  . Arthritis 04/19/2011   Rayetta Humphrey, PT CLT [phone] 12/26/2017, 11:56 AM  [facility] [address] [place], [place], [address] Phone: [phone]   Fax:  [phone]  Name: [name] MRN: [id number] Date of Birth: [birth year]

## 2017-12-28 ENCOUNTER — Ambulatory Visit (HOSPITAL_COMMUNITY): Payer: Medicare HMO | Attending: Pulmonary Disease

## 2017-12-28 ENCOUNTER — Encounter (HOSPITAL_COMMUNITY): Payer: Self-pay

## 2017-12-28 DIAGNOSIS — M25572 Pain in left ankle and joints of left foot: Secondary | ICD-10-CM | POA: Insufficient documentation

## 2017-12-28 DIAGNOSIS — M25672 Stiffness of left ankle, not elsewhere classified: Secondary | ICD-10-CM

## 2017-12-28 NOTE — Therapy (Signed)
[place], [place], [address] Phone: [phone]   Fax:  [phone]  Physical Therapy Treatment  Patient Details  Name: [name] MRN: [id number] Date of Birth: 01-23-1934 Referring Provider (PT): Max Hyatt    Encounter Date: 12/28/2017  PT End of Session - 12/28/17 1310    Visit Number  11    Number of Visits  18    Date for PT Re-Evaluation  01/18/18    Authorization Type  AETNA    Authorization Time Period  10/30--01/18/18    Authorization - Visit Number  11    Authorization - Number of Visits  18    PT Start Time  6962    PT Stop Time  1343    PT Time Calculation (min)  38 min    Activity Tolerance  Patient tolerated treatment well;Patient limited by fatigue    Behavior During Therapy  [facility] for tasks assessed/performed       Past Medical History:  Diagnosis Date  . Abrasion of arm, left    secondary to fall   . Anemia   . Arthritis   . Breast cancer, left ([place])    er/pr +  . Cataract    no surgery as yet,starting of 3 years  . Chronic kidney disease    renal stones, one episode of Lithotripsy  . Cirrhosis (HCC)    Metavir score 4  . Complication of anesthesia   . Diabetes mellitus   . GERD (gastroesophageal reflux disease)   . Hyperlipidemia   . Hypertension    followed by [facility]. relative to  urology study grp.  Doesn't report ever having a stress test   . Mental disorder   . Multiple falls   . Nocturia   . PONV (postoperative nausea and vomiting)   . Seasonal allergies   . Skin cancer    face- melanoma, 2012- surg. excision      Past Surgical History:  Procedure Laterality Date  . ABDOMINAL HYSTERECTOMY    . BACK SURGERY     x4 back surgery to include rods & screws  . Last in May 2014  . BREAST LUMPECTOMY WITH NEEDLE LOCALIZATION AND AXILLARY SENTINEL LYMPH NODE BX  03/12/2012   Procedure: BREAST LUMPECTOMY WITH NEEDLE LOCALIZATION AND AXILLARY SENTINEL LYMPH NODE BX;   Surgeon: Merrie Roof, MD;  Location: [place];  Service: General;  Laterality: Left;  . BREAST SURGERY     lumpectomy x2/left   . CHOLECYSTECTOMY    . COLONOSCOPY N/A 11/20/2013   Dr.Rourk- diverticula was found in the left colon. o/w normal rectal, colonic and terminal ileal mucosa  . ESOPHAGOGASTRODUODENOSCOPY N/A 11/20/2013   Dr.Rourk- abnormal mucosa was found. diffuse snake skinning and friability of the gastric mucosa. patent pylorus. abnormal-appearing first, second and third portion of the duodenum. 23mm gastric nodule at the GE junction. stomach bx= minimal chronic infammation, GE junction bx= polypoid fragments of gastric cardia type mucosa w/mod chronic inflammation and foveolar hyperplasia  . EYE SURGERY     cataract surgery bilat   . FRACTURE SURGERY     1975- ORIF- L ankle   . GIVENS CAPSULE STUDY N/A 02/24/2014   Procedure: GIVENS CAPSULE STUDY;  Surgeon: Daneil Dolin, MD;  [facility];  Service: Endoscopy;  Laterality: N/A;  . RE-EXCISION OF BREAST LUMPECTOMY  03/22/2012   Procedure: RE-EXCISION OF BREAST LUMPECTOMY;  Surgeon: Merrie Roof, MD;  Location: [facility];  Service: General;  Laterality: Left;  re-excision left breast lumpectomy cavity  ER+, PR+  . ROTATOR CUFF REPAIR Bilateral   . SHOULDER SURGERY    . SKIN BIOPSY    . TOTAL KNEE ARTHROPLASTY Right 12/18/2014   Procedure: RIGHT TOTAL KNEE ARTHROPLASTY;  Surgeon: Sydnee Cabal, MD;  [facility];  Service: Orthopedics;  Laterality: Right;  . TUBAL LIGATION      There were no vitals filed for this visit.  Subjective Assessment - 12/28/17 1309    Subjective  Pt arrived wearing her juxtalite and stated she really likes.  No reports of pain currenlty, just mainly stiffness Lt ankle      Pertinent History  Hx 1975 having Lt ankle surgery, DM, HTN, skin cancer, LBP, acute renal failure UTI's     Patient Stated Goals  less pain, to be able to walk better.     Currently in Pain?   No/denies   stiffness                      [facility] Adult PT Treatment/Exercise - 12/28/17 0001      Ambulation/Gait   Gait Comments  Pt walks forward bent weight is no longer on forearms but on hands.      Ankle Exercises: Standing   SLS  NBOS on foam 3x 30"; last set with 10 reps UE flexion    Rebounder  tandem stance 3x 30" with intermittent HHA required    Heel Raises  15 reps    Toe Raise  15 reps    Other Standing Ankle Exercises  sidestep 1RT with intermittent HHA in // bars      Ankle Exercises: Seated   BAPS  Sitting;Level 3;10 reps    BAPS Limitations  10 reps all directions including clockwise and counter clockwise                PT Short Term Goals - 12/26/17 1124      PT SHORT TERM GOAL #1   Title  PT Lt DF to improve to 3 degrees dorsiflexion to allow pt to have a more normalized gait.     Time  2    Period  Weeks    Status  Achieved      PT SHORT TERM GOAL #2   Title  Core strength improved 1/2 grade to allow pt to stand more erect to take stress of both her back and her ankle.     Time  2    Period  Weeks    Status  On-going      PT SHORT TERM GOAL #3   Title  Pt to be able to walk for 10 mintues with her rollator.     Time  2    Period  Weeks    Status  Achieved      PT SHORT TERM GOAL #4   Title  Pt edema to be decreased by 1.5 cm to allow improved flexibility     Time  2    Period  Weeks    Status  Achieved        PT Long Term Goals - 12/26/17 1125      PT LONG TERM GOAL #1   Title  Pt Dorsiflexion on left ankle to have increased to 8 degrees to allow normal motion of ankle while walking     Time  4    Period  Weeks    Status  On-going      PT LONG TERM GOAL #2   Title  PT Pain in her left ankle to be no greater than a 4/10 to allow pt to be up for 15 mintues at a time with her rollator to complete household tasks.     Time  4    Period  Weeks    Status  Achieved      PT LONG TERM GOAL #3   Title  PT to have  lost 2-3 cm of volume from Lt LE to allow pt to don compression garment with greater ease.     Time  4    Period  Weeks    Status  Achieved      PT LONG TERM GOAL #4   Title  Pt to understand that she needs to wear compression garments on a regular basis to prevent increased edema.     Time  4    Period  Weeks    Status  Achieved      PT LONG TERM GOAL #5   Title  PT to be able to single leg stance for 5 seconds bilaterally to decrease risk of falling     Time  4    Period  Weeks    Status  On-going      PT LONG TERM GOAL #6   Title  Pt Lt ankle strength to be at least a 4+/5 to allow improved stability while walking     Time  4    Period  Weeks    Status  On-going            Plan - 12/28/17 1345    Clinical Impression Statement  Session focus on ankle mobility following reports of stiffness this afternoon, stability with balance and strengthening.  Added UE movements with NBOS to improve balance with dynamic movements, min A for safety with minimal LOB episodes.  Cueing for posture through session to improve LE weight bearing.  No reports of pain through session, was limited by fatigue with activities.      Rehab Potential  Good    PT Frequency  2x / week    PT Duration  3 weeks   3x/week for 4 weeks then 2x/week for 3 more total of 7 weeks    PT Treatment/Interventions  ADLs/Self Care Home Management;Therapeutic exercise;Balance training;Neuromuscular re-education;Cognitive remediation;Patient/family education;Manual techniques;Manual lymph drainage;Compression bandaging;Passive range of motion    PT Next Visit Plan  continue to improve ankle strength and stabiltiy avoid single leg activity due to weakened core and LE     PT Home Exercise Plan  ABC, Sitting Dorsi/plantarflexion, supine ROM exercises. T band exercises        Patient will benefit from skilled therapeutic intervention in order to improve the following deficits and impairments:  Abnormal gait, Decreased  activity tolerance, Decreased balance, Decreased mobility, Decreased range of motion, Decreased strength, Difficulty walking, Hypomobility, Increased edema, Postural dysfunction, Obesity  Visit Diagnosis: Stiffness of left ankle, not elsewhere classified  Pain in left ankle and joints of left foot     Problem List Patient Active Problem List   Diagnosis Date Noted  . Altered mental status 08/18/2017  . Acute encephalopathy 08/17/2017  . Encephalopathy acute 08/17/2017  . Encephalopathy 07/25/2017  . Diabetes mellitus ([place]) 09/02/2015  . Anemia due to multiple mechanisms 02/17/2015  . Chronic LBP 02/17/2015  . Impaired renal function 02/17/2015  . Abducens nerve weakness 02/17/2015  . S/P knee replacement 12/18/2014  .  Osteoarthritis of right knee 12/18/2014  . IDA (iron deficiency anemia) 10/29/2013  . Vitamin B12 deficiency anemia 09/09/2013  . Anemia, iron deficiency 09/09/2013  . Lumbago 08/06/2013  . Difficulty in walking(719.7) 08/06/2013  . Osteopenia 03/11/2013  . Malignant neoplasm of lower-inner quadrant of female breast ([place]) 03/10/2013  . Bilateral leg weakness 09/23/2012  . Acute renal failure ([place]) 07/25/2012  . Dehydration 07/25/2012  . DM type 2, uncontrolled, with neuropathy ([place]) 07/25/2012  . DM (diabetes mellitus), type 2, uncontrolled ([place]) 07/25/2012  . Essential hypertension, benign 07/25/2012  . Generalized weakness 07/25/2012  . Hyperlipidemia   . Chronic kidney disease   . Skin cancer   . Hypertension   . GERD (gastroesophageal reflux disease)   . Breast cancer ([place]) 02/19/2012  . CAP (community acquired pneumonia) 04/19/2011  . Nausea and vomiting 04/19/2011  . HTN (hypertension) 04/19/2011  . DM type 2 (diabetes mellitus, type 2) ([place]) 04/19/2011  . Arthritis 04/19/2011   Ihor Austin, [place]; [place]  Aldona Lento 12/28/2017, 1:51 PM  [place], [place], [address] Phone: [phone]   Fax:  [phone]  Name: [name] MRN: [id number] Date of Birth: 1933/03/20

## 2017-12-31 DIAGNOSIS — R69 Illness, unspecified: Secondary | ICD-10-CM | POA: Diagnosis not present

## 2018-01-02 ENCOUNTER — Encounter (HOSPITAL_COMMUNITY): Payer: Self-pay | Admitting: Physical Therapy

## 2018-01-02 ENCOUNTER — Ambulatory Visit (HOSPITAL_COMMUNITY): Payer: Medicare HMO | Admitting: Physical Therapy

## 2018-01-02 DIAGNOSIS — M25672 Stiffness of left ankle, not elsewhere classified: Secondary | ICD-10-CM | POA: Diagnosis not present

## 2018-01-02 DIAGNOSIS — M25572 Pain in left ankle and joints of left foot: Secondary | ICD-10-CM | POA: Diagnosis not present

## 2018-01-02 NOTE — Therapy (Signed)
[place], [place], [address] Phone: [phone]   Fax:  [phone]  Physical Therapy Treatment  Patient Details  Name: [name] MRN: [id number] Date of Birth: [birth year] Referring Provider (PT): Max Hyatt    Encounter Date: 01/02/2018  PT End of Session - 01/02/18 1600    Visit Number  12    Number of Visits  18    Date for PT Re-Evaluation  01/18/18    Authorization Type  AETNA    Authorization Time Period  10/30--01/18/18    Authorization - Visit Number  12    Authorization - Number of Visits  18    PT Start Time  7782    PT Stop Time  1603    PT Time Calculation (min)  40 min    Activity Tolerance  Patient tolerated treatment well;Patient limited by fatigue    Behavior During Therapy  [facility] for tasks assessed/performed       Past Medical History:  Diagnosis Date  . Abrasion of arm, left    secondary to fall   . Anemia   . Arthritis   . Breast cancer, left ([place])    er/pr +  . Cataract    no surgery as yet,starting of 3 years  . Chronic kidney disease    renal stones, one episode of Lithotripsy  . Cirrhosis (HCC)    Metavir score 4  . Complication of anesthesia   . Diabetes mellitus   . GERD (gastroesophageal reflux disease)   . Hyperlipidemia   . Hypertension    followed by [facility]. relative to  urology study grp.  Doesn't report ever having a stress test   . Mental disorder   . Multiple falls   . Nocturia   . PONV (postoperative nausea and vomiting)   . Seasonal allergies   . Skin cancer    face- melanoma, 2012- surg. excision      Past Surgical History:  Procedure Laterality Date  . ABDOMINAL HYSTERECTOMY    . BACK SURGERY     x4 back surgery to include rods & screws  . Last in May 2014  . BREAST LUMPECTOMY WITH NEEDLE LOCALIZATION AND AXILLARY SENTINEL LYMPH NODE BX  03/12/2012   Procedure: BREAST LUMPECTOMY WITH NEEDLE LOCALIZATION AND AXILLARY SENTINEL LYMPH NODE BX;   Surgeon: Merrie Roof, MD;  Location: [facility];  Service: General;  Laterality: Left;  . BREAST SURGERY     lumpectomy x2/left   . CHOLECYSTECTOMY    . COLONOSCOPY N/A 11/20/2013   Dr.Rourk- diverticula was found in the left colon. o/w normal rectal, colonic and terminal ileal mucosa  . ESOPHAGOGASTRODUODENOSCOPY N/A 11/20/2013   Dr.Rourk- abnormal mucosa was found. diffuse snake skinning and friability of the gastric mucosa. patent pylorus. abnormal-appearing first, second and third portion of the duodenum. 36mm gastric nodule at the GE junction. stomach bx= minimal chronic infammation, GE junction bx= polypoid fragments of gastric cardia type mucosa w/mod chronic inflammation and foveolar hyperplasia  . EYE SURGERY     cataract surgery bilat   . FRACTURE SURGERY     1975- ORIF- L ankle   . GIVENS CAPSULE STUDY N/A 02/24/2014   Procedure: GIVENS CAPSULE STUDY;  Surgeon: Daneil Dolin, MD;  [facility];  Service: Endoscopy;  Laterality: N/A;  . RE-EXCISION OF BREAST LUMPECTOMY  03/22/2012   Procedure: RE-EXCISION OF BREAST LUMPECTOMY;  Surgeon: Merrie Roof, MD;  Location: [facility];  Service: General;  Laterality: Left;  re-excision left breast lumpectomy cavity  ER+, PR+  . ROTATOR CUFF REPAIR Bilateral   . SHOULDER SURGERY    . SKIN BIOPSY    . TOTAL KNEE ARTHROPLASTY Right 12/18/2014   Procedure: RIGHT TOTAL KNEE ARTHROPLASTY;  Surgeon: Sydnee Cabal, MD;  [facility];  Service: Orthopedics;  Laterality: Right;  . TUBAL LIGATION      There were no vitals filed for this visit.  Subjective Assessment - 01/02/18 1519    Subjective  PT states that she woke up with her ankle hurting; she is not sure why.    Pertinent History  Hx 1975 having Lt ankle surgery, DM, HTN, skin cancer, LBP, acute renal failure UTI's     Patient Stated Goals  less pain, to be able to walk better.     Currently in Pain?  Yes    Pain Score  6     Pain Location  Ankle    Pain  Orientation  Left    Pain Descriptors / Indicators  Aching    Pain Type  Chronic pain    Pain Onset  More than a month ago    Pain Frequency  Constant    Aggravating Factors   activity     Pain Relieving Factors  relax                        [facility] Adult PT Treatment/Exercise - 01/02/18 0001      Ambulation/Gait   Gait Comments  Pt walks forward bent weight is no longer on forearms but on hands.      Exercises   Exercises  Ankle      Ankle Exercises: Standing   SLS  NBOS on foam 3x 30"; last set with 10 reps UE flexion    Rebounder  tandem stance 3x 30" with intermittent HHA required    Heel Raises  15 reps    Toe Raise  15 reps    Other Standing Ankle Exercises  sidestep 1RT with intermittent HHA in // bars    Other Standing Ankle Exercises  step up onto 4" step x 10; lunging B x 10       Ankle Exercises: Seated   BAPS  Sitting;Level 3;10 reps    BAPS Limitations  10 reps all directions including clockwise and counter clockwise     Other Seated Ankle Exercises  seated DF with 4# x 10       Ankle Exercises: Stretches   Slant Board Stretch  3 reps;30 seconds      Ankle Exercises: Supine   Other Supine Ankle Exercises  bridge x 15      Ankle Exercises: Sidelying   Ankle Inversion  10 reps    Ankle Inversion Weights (lbs)  3    Ankle Eversion  10 reps    Ankle Eversion Weights (lbs)  3               PT Short Term Goals - 12/26/17 1124      PT SHORT TERM GOAL #1   Title  PT Lt DF to improve to 3 degrees dorsiflexion to allow pt to have a more normalized gait.     Time  2    Period  Weeks    Status  Achieved      PT SHORT TERM GOAL #2   Title  Core strength improved 1/2 grade to allow pt to  stand more erect to take stress of both her back and her ankle.     Time  2    Period  Weeks    Status  On-going      PT SHORT TERM GOAL #3   Title  Pt to be able to walk for 10 mintues with her rollator.     Time  2    Period  Weeks    Status   Achieved      PT SHORT TERM GOAL #4   Title  Pt edema to be decreased by 1.5 cm to allow improved flexibility     Time  2    Period  Weeks    Status  Achieved        PT Long Term Goals - 12/26/17 1125      PT LONG TERM GOAL #1   Title  Pt Dorsiflexion on left ankle to have increased to 8 degrees to allow normal motion of ankle while walking     Time  4    Period  Weeks    Status  On-going      PT LONG TERM GOAL #2   Title  PT Pain in her left ankle to be no greater than a 4/10 to allow pt to be up for 15 mintues at a time with her rollator to complete household tasks.     Time  4    Period  Weeks    Status  Achieved      PT LONG TERM GOAL #3   Title  PT to have lost 2-3 cm of volume from Lt LE to allow pt to don compression garment with greater ease.     Time  4    Period  Weeks    Status  Achieved      PT LONG TERM GOAL #4   Title  Pt to understand that she needs to wear compression garments on a regular basis to prevent increased edema.     Time  4    Period  Weeks    Status  Achieved      PT LONG TERM GOAL #5   Title  PT to be able to single leg stance for 5 seconds bilaterally to decrease risk of falling     Time  4    Period  Weeks    Status  On-going      PT LONG TERM GOAL #6   Title  Pt Lt ankle strength to be at least a 4+/5 to allow improved stability while walking     Time  4    Period  Weeks    Status  On-going            Plan - 01/02/18 1601    Clinical Impression Statement  Added functional activity to strengthening regieme.  Pt had no complaint of increased pain throughout treatment.  Pt continues to have decreased functional strength and will continue to benefit from skilled physical therapy.     Rehab Potential  Good    PT Frequency  2x / week    PT Duration  3 weeks   3x/week for 4 weeks then 2x/week for 3 more total of 7 weeks    PT Treatment/Interventions  ADLs/Self Care Home Management;Therapeutic exercise;Balance  training;Neuromuscular re-education;Cognitive remediation;Patient/family education;Manual techniques;Manual lymph drainage;Compression bandaging;Passive range of motion    PT Next Visit Plan  continue to improve ankle strength and stabiltiy avoid single leg activity due to weakened core and  LE     PT Home Exercise Plan  ABC, Sitting Dorsi/plantarflexion, supine ROM exercises. T band exercises        Patient will benefit from skilled therapeutic intervention in order to improve the following deficits and impairments:  Abnormal gait, Decreased activity tolerance, Decreased balance, Decreased mobility, Decreased range of motion, Decreased strength, Difficulty walking, Hypomobility, Increased edema, Postural dysfunction, Obesity  Visit Diagnosis: Stiffness of left ankle, not elsewhere classified     Problem List Patient Active Problem List   Diagnosis Date Noted  . Altered mental status 08/18/2017  . Acute encephalopathy 08/17/2017  . Encephalopathy acute 08/17/2017  . Encephalopathy 07/25/2017  . Diabetes mellitus ([place]) 09/02/2015  . Anemia due to multiple mechanisms 02/17/2015  . Chronic LBP 02/17/2015  . Impaired renal function 02/17/2015  . Abducens nerve weakness 02/17/2015  . S/P knee replacement 12/18/2014  . Osteoarthritis of right knee 12/18/2014  . IDA (iron deficiency anemia) 10/29/2013  . Vitamin B12 deficiency anemia 09/09/2013  . Anemia, iron deficiency 09/09/2013  . Lumbago 08/06/2013  . Difficulty in walking(719.7) 08/06/2013  . Osteopenia 03/11/2013  . Malignant neoplasm of lower-inner quadrant of female breast ([place]) 03/10/2013  . Bilateral leg weakness 09/23/2012  . Acute renal failure ([place]) 07/25/2012  . Dehydration 07/25/2012  . DM type 2, uncontrolled, with neuropathy ([place]) 07/25/2012  . DM (diabetes mellitus), type 2, uncontrolled ([place]) 07/25/2012  . Essential hypertension, benign 07/25/2012  . Generalized weakness 07/25/2012  . Hyperlipidemia   . Chronic  kidney disease   . Skin cancer   . Hypertension   . GERD (gastroesophageal reflux disease)   . Breast cancer ([place]) 02/19/2012  . CAP (community acquired pneumonia) 04/19/2011  . Nausea and vomiting 04/19/2011  . HTN (hypertension) 04/19/2011  . DM type 2 (diabetes mellitus, type 2) ([place]) 04/19/2011  . Arthritis 04/19/2011    [name],CINDY 01/02/2018, 4:07 PM  [place] [address] [place], [place], [address] Phone: [phone]   Fax:  [phone]  Name: [name] MRN: [id number] Date of Birth: [birth year]

## 2018-01-03 ENCOUNTER — Ambulatory Visit (HOSPITAL_COMMUNITY): Payer: Medicare HMO

## 2018-01-03 ENCOUNTER — Encounter (HOSPITAL_COMMUNITY): Payer: Self-pay

## 2018-01-03 DIAGNOSIS — M25572 Pain in left ankle and joints of left foot: Secondary | ICD-10-CM | POA: Diagnosis not present

## 2018-01-03 DIAGNOSIS — M25672 Stiffness of left ankle, not elsewhere classified: Secondary | ICD-10-CM

## 2018-01-03 NOTE — Therapy (Signed)
[place], [place], [address] Phone: [phone]   Fax:  [phone]  Physical Therapy Treatment  Patient Details  Name: [name] MRN: [id number] Date of Birth: 1933-12-01 Referring Provider (PT): Max Hyatt    Encounter Date: 01/03/2018  PT End of Session - 01/03/18 1525    Visit Number  13    Number of Visits  18    Date for PT Re-Evaluation  01/18/18    Authorization Type  AETNA    Authorization Time Period  10/30--01/18/18    Authorization - Visit Number  13    Authorization - Number of Visits  18    PT Start Time  0263   pt arrived late   PT Stop Time  1604    PT Time Calculation (min)  39 min    Activity Tolerance  Patient tolerated treatment well;Patient limited by fatigue    Behavior During Therapy  [facility] for tasks assessed/performed       Past Medical History:  Diagnosis Date  . Abrasion of arm, left    secondary to fall   . Anemia   . Arthritis   . Breast cancer, left ([place])    er/pr +  . Cataract    no surgery as yet,starting of 3 years  . Chronic kidney disease    renal stones, one episode of Lithotripsy  . Cirrhosis (HCC)    Metavir score 4  . Complication of anesthesia   . Diabetes mellitus   . GERD (gastroesophageal reflux disease)   . Hyperlipidemia   . Hypertension    followed by [facility]. relative to  urology study grp.  Doesn't report ever having a stress test   . Mental disorder   . Multiple falls   . Nocturia   . PONV (postoperative nausea and vomiting)   . Seasonal allergies   . Skin cancer    face- melanoma, 2012- surg. excision      Past Surgical History:  Procedure Laterality Date  . ABDOMINAL HYSTERECTOMY    . BACK SURGERY     x4 back surgery to include rods & screws  . Last in May 2014  . BREAST LUMPECTOMY WITH NEEDLE LOCALIZATION AND AXILLARY SENTINEL LYMPH NODE BX  03/12/2012   Procedure: BREAST LUMPECTOMY WITH NEEDLE LOCALIZATION AND AXILLARY SENTINEL  LYMPH NODE BX;  Surgeon: Merrie Roof, MD;  Location: [place];  Service: General;  Laterality: Left;  . BREAST SURGERY     lumpectomy x2/left   . CHOLECYSTECTOMY    . COLONOSCOPY N/A 11/20/2013   Dr.Rourk- diverticula was found in the left colon. o/w normal rectal, colonic and terminal ileal mucosa  . ESOPHAGOGASTRODUODENOSCOPY N/A 11/20/2013   Dr.Rourk- abnormal mucosa was found. diffuse snake skinning and friability of the gastric mucosa. patent pylorus. abnormal-appearing first, second and third portion of the duodenum. 53m gastric nodule at the GE junction. stomach bx= minimal chronic infammation, GE junction bx= polypoid fragments of gastric cardia type mucosa w/mod chronic inflammation and foveolar hyperplasia  . EYE SURGERY     cataract surgery bilat   . FRACTURE SURGERY     1975- ORIF- L ankle   . GIVENS CAPSULE STUDY N/A 02/24/2014   Procedure: GIVENS CAPSULE STUDY;  Surgeon: RDaneil Dolin MD;  [facility];  Service: Endoscopy;  Laterality: N/A;  . RE-EXCISION OF BREAST LUMPECTOMY  03/22/2012   Procedure: RE-EXCISION OF BREAST LUMPECTOMY;  Surgeon: PMerrie Roof MD;  Location: [place] [facility];  Service: General;  Laterality: Left;  re-excision left breast lumpectomy cavity  ER+, PR+  . ROTATOR CUFF REPAIR Bilateral   . SHOULDER SURGERY    . SKIN BIOPSY    . TOTAL KNEE ARTHROPLASTY Right 12/18/2014   Procedure: RIGHT TOTAL KNEE ARTHROPLASTY;  Surgeon: Sydnee Cabal, MD;  [facility];  Service: Orthopedics;  Laterality: Right;  . TUBAL LIGATION      There were no vitals filed for this visit.  Subjective Assessment - 01/03/18 1526    Subjective  Pt states she's a little stiff today. ankle pain about 5/10.    Pertinent History  Hx 1975 having Lt ankle surgery, DM, HTN, skin cancer, LBP, acute renal failure UTI's     Patient Stated Goals  less pain, to be able to walk better.     Currently in Pain?  Yes    Pain Score  5     Pain Location  Ankle     Pain Orientation  Left    Pain Descriptors / Indicators  Aching;Tightness    Pain Type  Chronic pain    Pain Onset  More than a month ago    Pain Frequency  Constant    Aggravating Factors   activity    Pain Relieving Factors  relax    Effect of Pain on Daily Activities  limits when she has pain            [facility] Adult PT Treatment/Exercise - 01/03/18 0001      Exercises   Exercises  Ankle      Ankle Exercises: Stretches   Slant Board Stretch  3 reps;30 seconds    Other Stretch  knee drives on 6" step for ankle DF 5x10" holds      Ankle Exercises: Standing   SLS  NBOS on foam +UE flexion 2x10reps    Rebounder  bil tandem stance foam 3x15" holds, intermittent UE support    Braiding (Round Trip)  fwd lunging onto airex x10 reps each    Tai Chi  fwd step up onto airex x10 LLE only      Ankle Exercises: Seated   Towel Inversion/Eversion Limitations  10x5" holds each    [place] Pickup  --    BAPS  Sitting;Level 4;10 reps    BAPS Limitations  DF/PF, inv/ev, CW/CCW           PT Education - 01/03/18 1526    Education Details  exercise technique, continue HEP    Person(s) Educated  Patient    Methods  Explanation;Demonstration    Comprehension  Verbalized understanding       PT Short Term Goals - 12/26/17 1124      PT SHORT TERM GOAL #1   Title  PT Lt DF to improve to 3 degrees dorsiflexion to allow pt to have a more normalized gait.     Time  2    Period  Weeks    Status  Achieved      PT SHORT TERM GOAL #2   Title  Core strength improved 1/2 grade to allow pt to stand more erect to take stress of both her back and her ankle.     Time  2    Period  Weeks    Status  On-going      PT SHORT TERM GOAL #3   Title  Pt to be able to walk for 10 mintues with her rollator.     Time  2    Period  Weeks    Status  Achieved      PT SHORT TERM GOAL #4   Title  Pt edema to be decreased by 1.5 cm to allow improved flexibility     Time  2    Period  Weeks    Status   Achieved        PT Long Term Goals - 12/26/17 1125      PT LONG TERM GOAL #1   Title  Pt Dorsiflexion on left ankle to have increased to 8 degrees to allow normal motion of ankle while walking     Time  4    Period  Weeks    Status  On-going      PT LONG TERM GOAL #2   Title  PT Pain in her left ankle to be no greater than a 4/10 to allow pt to be up for 15 mintues at a time with her rollator to complete household tasks.     Time  4    Period  Weeks    Status  Achieved      PT LONG TERM GOAL #3   Title  PT to have lost 2-3 cm of volume from Lt LE to allow pt to don compression garment with greater ease.     Time  4    Period  Weeks    Status  Achieved      PT LONG TERM GOAL #4   Title  Pt to understand that she needs to wear compression garments on a regular basis to prevent increased edema.     Time  4    Period  Weeks    Status  Achieved      PT LONG TERM GOAL #5   Title  PT to be able to single leg stance for 5 seconds bilaterally to decrease risk of falling     Time  4    Period  Weeks    Status  On-going      PT LONG TERM GOAL #6   Title  Pt Lt ankle strength to be at least a 4+/5 to allow improved stability while walking     Time  4    Period  Weeks    Status  On-going            Plan - 01/03/18 1606    Clinical Impression Statement  Continued with focus on L ankle mobility and stability. Had her perform L4 on BAPS, however, pt with difficulty performing through full range so potentially return her to L3 next visit. Added knee drives on step for DF as well as step ups, lunges, and tandem stance on foam while continuing NBOS +UE flexion on foam today. No reports of increased pain throughout session, pt just requiring a few seated rest breaks due to fatigue. Continue as planned, progressing as able.     Clinical Presentation  Stable    Clinical Decision Making  Moderate    Rehab Potential  Good    PT Frequency  2x / week    PT Duration  3 weeks   3x/week  for 4 weeks then 2x/week for 3 more total of 7 weeks    PT Treatment/Interventions  ADLs/Self Care Home Management;Therapeutic exercise;Balance training;Neuromuscular re-education;Cognitive remediation;Patient/family education;Manual techniques;Manual lymph drainage;Compression bandaging;Passive range of motion    PT Next Visit Plan  continue to progress ankle strength and stability as able, avoiding single leg activity due to weakened core  and LE;    PT Home Exercise Plan  ABC, Sitting Dorsi/plantarflexion, supine ROM exercises. T band exercises     Consulted and Agree with Plan of Care  Patient       Patient will benefit from skilled therapeutic intervention in order to improve the following deficits and impairments:  Abnormal gait, Decreased activity tolerance, Decreased balance, Decreased mobility, Decreased range of motion, Decreased strength, Difficulty walking, Hypomobility, Increased edema, Postural dysfunction, Obesity  Visit Diagnosis: Stiffness of left ankle, not elsewhere classified  Pain in left ankle and joints of left foot     Problem List Patient Active Problem List   Diagnosis Date Noted  . Altered mental status 08/18/2017  . Acute encephalopathy 08/17/2017  . Encephalopathy acute 08/17/2017  . Encephalopathy 07/25/2017  . Diabetes mellitus ([place]) 09/02/2015  . Anemia due to multiple mechanisms 02/17/2015  . Chronic LBP 02/17/2015  . Impaired renal function 02/17/2015  . Abducens nerve weakness 02/17/2015  . S/P knee replacement 12/18/2014  . Osteoarthritis of right knee 12/18/2014  . IDA (iron deficiency anemia) 10/29/2013  . Vitamin B12 deficiency anemia 09/09/2013  . Anemia, iron deficiency 09/09/2013  . Lumbago 08/06/2013  . Difficulty in walking(719.7) 08/06/2013  . Osteopenia 03/11/2013  . Malignant neoplasm of lower-inner quadrant of female breast ([place]) 03/10/2013  . Bilateral leg weakness 09/23/2012  . Acute renal failure ([place]) 07/25/2012  .  Dehydration 07/25/2012  . DM type 2, uncontrolled, with neuropathy ([place]) 07/25/2012  . DM (diabetes mellitus), type 2, uncontrolled ([place]) 07/25/2012  . Essential hypertension, benign 07/25/2012  . Generalized weakness 07/25/2012  . Hyperlipidemia   . Chronic kidney disease   . Skin cancer   . Hypertension   . GERD (gastroesophageal reflux disease)   . Breast cancer ([place]) 02/19/2012  . CAP (community acquired pneumonia) 04/19/2011  . Nausea and vomiting 04/19/2011  . HTN (hypertension) 04/19/2011  . DM type 2 (diabetes mellitus, type 2) ([place]) 04/19/2011  . Arthritis 04/19/2011       Geraldine Solar PT, [facility] [address] [place], [place], [address] Phone: [phone]   Fax:  [phone]  Name: [name] MRN: [id number] Date of Birth: 1933/03/11

## 2018-01-08 ENCOUNTER — Ambulatory Visit (HOSPITAL_COMMUNITY): Payer: Medicare HMO | Admitting: Physical Therapy

## 2018-01-08 ENCOUNTER — Telehealth (HOSPITAL_COMMUNITY): Payer: Self-pay | Admitting: Pulmonary Disease

## 2018-01-08 NOTE — Telephone Encounter (Signed)
01/08/18  pt left a message that she will be unable to come today

## 2018-01-09 ENCOUNTER — Other Ambulatory Visit (HOSPITAL_COMMUNITY): Payer: Self-pay | Admitting: Pulmonary Disease

## 2018-01-09 DIAGNOSIS — Z9889 Other specified postprocedural states: Secondary | ICD-10-CM

## 2018-01-10 ENCOUNTER — Encounter (HOSPITAL_COMMUNITY): Payer: Self-pay | Admitting: Physical Therapy

## 2018-01-10 ENCOUNTER — Ambulatory Visit (HOSPITAL_COMMUNITY): Payer: Medicare HMO | Admitting: Physical Therapy

## 2018-01-10 DIAGNOSIS — M25572 Pain in left ankle and joints of left foot: Secondary | ICD-10-CM | POA: Diagnosis not present

## 2018-01-10 DIAGNOSIS — M25672 Stiffness of left ankle, not elsewhere classified: Secondary | ICD-10-CM | POA: Diagnosis not present

## 2018-01-10 NOTE — Therapy (Signed)
[place], [place], [address] Phone: [phone]   Fax:  [phone]  Physical Therapy Treatment  Patient Details  Name: [name] MRN: [id number] Date of Birth: 03-13-1933 Referring Provider (PT): Max Milinda Pointer    Encounter Date: 01/10/2018  PT End of Session - 01/10/18 1531    Visit Number  14    Number of Visits  18    Date for PT Re-Evaluation  01/18/18    Authorization Type  AETNA    Authorization Time Period  10/30--01/18/18    Authorization - Visit Number  14    Authorization - Number of Visits  18    PT Start Time  6962   Patient arrived late   PT Stop Time  1603    PT Time Calculation (min)  38 min    Activity Tolerance  Patient tolerated treatment well;Patient limited by fatigue    Behavior During Therapy  [facility] for tasks assessed/performed       Past Medical History:  Diagnosis Date  . Abrasion of arm, left    secondary to fall   . Anemia   . Arthritis   . Breast cancer, left ([place])    er/pr +  . Cataract    no surgery as yet,starting of 3 years  . Chronic kidney disease    renal stones, one episode of Lithotripsy  . Cirrhosis (HCC)    Metavir score 4  . Complication of anesthesia   . Diabetes mellitus   . GERD (gastroesophageal reflux disease)   . Hyperlipidemia   . Hypertension    followed by [facility]. relative to  urology study grp.  Doesn't report ever having a stress test   . Mental disorder   . Multiple falls   . Nocturia   . PONV (postoperative nausea and vomiting)   . Seasonal allergies   . Skin cancer    face- melanoma, 2012- surg. excision      Past Surgical History:  Procedure Laterality Date  . ABDOMINAL HYSTERECTOMY    . BACK SURGERY     x4 back surgery to include rods & screws  . Last in May 2014  . BREAST LUMPECTOMY WITH NEEDLE LOCALIZATION AND AXILLARY SENTINEL LYMPH NODE BX  03/12/2012   Procedure: BREAST LUMPECTOMY WITH NEEDLE LOCALIZATION AND AXILLARY  SENTINEL LYMPH NODE BX;  Surgeon: Merrie Roof, MD;  Location: [facility];  Service: General;  Laterality: Left;  . BREAST SURGERY     lumpectomy x2/left   . CHOLECYSTECTOMY    . COLONOSCOPY N/A 11/20/2013   Dr.Rourk- diverticula was found in the left colon. o/w normal rectal, colonic and terminal ileal mucosa  . ESOPHAGOGASTRODUODENOSCOPY N/A 11/20/2013   Dr.Rourk- abnormal mucosa was found. diffuse snake skinning and friability of the gastric mucosa. patent pylorus. abnormal-appearing first, second and third portion of the duodenum. 97m gastric nodule at the GE junction. stomach bx= minimal chronic infammation, GE junction bx= polypoid fragments of gastric cardia type mucosa w/mod chronic inflammation and foveolar hyperplasia  . EYE SURGERY     cataract surgery bilat   . FRACTURE SURGERY     1975- ORIF- L ankle   . GIVENS CAPSULE STUDY N/A 02/24/2014   Procedure: GIVENS CAPSULE STUDY;  Surgeon: RDaneil Dolin MD;  [facility];  Service: Endoscopy;  Laterality: N/A;  . RE-EXCISION OF BREAST LUMPECTOMY  03/22/2012   Procedure: RE-EXCISION OF BREAST LUMPECTOMY;  Surgeon: PMerrie Roof MD;  Location: [place];  Service: General;  Laterality: Left;  re-excision left breast lumpectomy cavity  ER+, PR+  . ROTATOR CUFF REPAIR Bilateral   . SHOULDER SURGERY    . SKIN BIOPSY    . TOTAL KNEE ARTHROPLASTY Right 12/18/2014   Procedure: RIGHT TOTAL KNEE ARTHROPLASTY;  Surgeon: Sydnee Cabal, MD;  [facility];  Service: Orthopedics;  Laterality: Right;  . TUBAL LIGATION      There were no vitals filed for this visit.  Subjective Assessment - 01/10/18 1529    Subjective  Patient stated her left ankle pain is about a 5/10 today.     Pertinent History  Hx 1975 having Lt ankle surgery, DM, HTN, skin cancer, LBP, acute renal failure UTI's     Patient Stated Goals  less pain, to be able to walk better.     Currently in Pain?  Yes    Pain Score  5     Pain Location   Ankle    Pain Orientation  Left    Pain Descriptors / Indicators  Aching    Pain Type  Chronic pain    Pain Onset  More than a month ago                       [facility] Adult PT Treatment/Exercise - 01/10/18 0001      Ankle Exercises: Stretches   Slant Board Stretch  3 reps;30 seconds    Other Stretch  knee drives on 6" step for ankle DF 5x10" holds      Ankle Exercises: Standing   SLS  NBOS on foam +UE flexion 2x10reps    Rebounder  bil tandem stance foam 3x15" holds, intermittent UE support    Braiding (Round Trip)  fwd lunging onto airex x10 reps each    Tai Chi  fwd step up onto airex x10 LLE only      Ankle Exercises: Seated   Towel Inversion/Eversion Limitations  10x5" holds each    BAPS  Sitting;10 reps;Level 3    BAPS Limitations  DF/PF, inv/ev, CW/CCW             PT Education - 01/10/18 1531    Education Details  Discussed purpose and technique of interventions throughout session.     Person(s) Educated  Patient    Methods  Explanation;Demonstration    Comprehension  Verbalized understanding       PT Short Term Goals - 12/26/17 1124      PT SHORT TERM GOAL #1   Title  PT Lt DF to improve to 3 degrees dorsiflexion to allow pt to have a more normalized gait.     Time  2    Period  Weeks    Status  Achieved      PT SHORT TERM GOAL #2   Title  Core strength improved 1/2 grade to allow pt to stand more erect to take stress of both her back and her ankle.     Time  2    Period  Weeks    Status  On-going      PT SHORT TERM GOAL #3   Title  Pt to be able to walk for 10 mintues with her rollator.     Time  2    Period  Weeks    Status  Achieved      PT SHORT TERM GOAL #4   Title  Pt edema to be decreased by 1.5 cm to  allow improved flexibility     Time  2    Period  Weeks    Status  Achieved        PT Long Term Goals - 12/26/17 1125      PT LONG TERM GOAL #1   Title  Pt Dorsiflexion on left ankle to have increased to 8 degrees to  allow normal motion of ankle while walking     Time  4    Period  Weeks    Status  On-going      PT LONG TERM GOAL #2   Title  PT Pain in her left ankle to be no greater than a 4/10 to allow pt to be up for 15 mintues at a time with her rollator to complete household tasks.     Time  4    Period  Weeks    Status  Achieved      PT LONG TERM GOAL #3   Title  PT to have lost 2-3 cm of volume from Lt LE to allow pt to don compression garment with greater ease.     Time  4    Period  Weeks    Status  Achieved      PT LONG TERM GOAL #4   Title  Pt to understand that she needs to wear compression garments on a regular basis to prevent increased edema.     Time  4    Period  Weeks    Status  Achieved      PT LONG TERM GOAL #5   Title  PT to be able to single leg stance for 5 seconds bilaterally to decrease risk of falling     Time  4    Period  Weeks    Status  On-going      PT LONG TERM GOAL #6   Title  Pt Lt ankle strength to be at least a 4+/5 to allow improved stability while walking     Time  4    Period  Weeks    Status  On-going            Plan - 01/10/18 1712    Clinical Impression Statement  This session continued with established plan of care focusing on improving patient's left ankle mobility. This session decreased BAPS level to level 3 to improve form with exercise. Patient required a couple seated rest breaks throughout session, however was able to tolerate all exercises well otherwise. Patient would benefit from continued skilled physical therapy in order to continue progressing towards functional goals.     Rehab Potential  Good    PT Frequency  2x / week    PT Duration  3 weeks   3x/week for 4 weeks then 2x/week for 3 more total of 7 weeks    PT Treatment/Interventions  ADLs/Self Care Home Management;Therapeutic exercise;Balance training;Neuromuscular re-education;Cognitive remediation;Patient/family education;Manual techniques;Manual lymph  drainage;Compression bandaging;Passive range of motion    PT Next Visit Plan  continue to progress ankle strength and stability as able, avoiding single leg activity due to weakened core and LE;    PT Home Exercise Plan  ABC, Sitting Dorsi/plantarflexion, supine ROM exercises. T band exercises     Consulted and Agree with Plan of Care  Patient       Patient will benefit from skilled therapeutic intervention in order to improve the following deficits and impairments:  Abnormal gait, Decreased activity tolerance, Decreased balance, Decreased mobility, Decreased range of motion, Decreased strength,  Difficulty walking, Hypomobility, Increased edema, Postural dysfunction, Obesity  Visit Diagnosis: Stiffness of left ankle, not elsewhere classified  Pain in left ankle and joints of left foot     Problem List Patient Active Problem List   Diagnosis Date Noted  . Altered mental status 08/18/2017  . Acute encephalopathy 08/17/2017  . Encephalopathy acute 08/17/2017  . Encephalopathy 07/25/2017  . Diabetes mellitus ([place]) 09/02/2015  . Anemia due to multiple mechanisms 02/17/2015  . Chronic LBP 02/17/2015  . Impaired renal function 02/17/2015  . Abducens nerve weakness 02/17/2015  . S/P knee replacement 12/18/2014  . Osteoarthritis of right knee 12/18/2014  . IDA (iron deficiency anemia) 10/29/2013  . Vitamin B12 deficiency anemia 09/09/2013  . Anemia, iron deficiency 09/09/2013  . Lumbago 08/06/2013  . Difficulty in walking(719.7) 08/06/2013  . Osteopenia 03/11/2013  . Malignant neoplasm of lower-inner quadrant of female breast ([place]) 03/10/2013  . Bilateral leg weakness 09/23/2012  . Acute renal failure ([place]) 07/25/2012  . Dehydration 07/25/2012  . DM type 2, uncontrolled, with neuropathy ([place]) 07/25/2012  . DM (diabetes mellitus), type 2, uncontrolled ([place]) 07/25/2012  . Essential hypertension, benign 07/25/2012  . Generalized weakness 07/25/2012  . Hyperlipidemia   . Chronic  kidney disease   . Skin cancer   . Hypertension   . GERD (gastroesophageal reflux disease)   . Breast cancer ([place]) 02/19/2012  . CAP (community acquired pneumonia) 04/19/2011  . Nausea and vomiting 04/19/2011  . HTN (hypertension) 04/19/2011  . DM type 2 (diabetes mellitus, type 2) ([place]) 04/19/2011  . Arthritis 04/19/2011   Clarene Critchley PT, DPT 5:14 PM, 01/10/18 [place], [place], [address] Phone: [phone]   Fax:  [phone]  Name: [name] MRN: [id number] Date of Birth: Jul 23, 1933

## 2018-01-11 DIAGNOSIS — E113293 Type 2 diabetes mellitus with mild nonproliferative diabetic retinopathy without macular edema, bilateral: Secondary | ICD-10-CM | POA: Diagnosis not present

## 2018-01-11 DIAGNOSIS — Z961 Presence of intraocular lens: Secondary | ICD-10-CM | POA: Diagnosis not present

## 2018-01-11 DIAGNOSIS — H401111 Primary open-angle glaucoma, right eye, mild stage: Secondary | ICD-10-CM | POA: Diagnosis not present

## 2018-01-11 DIAGNOSIS — H401123 Primary open-angle glaucoma, left eye, severe stage: Secondary | ICD-10-CM | POA: Diagnosis not present

## 2018-01-15 ENCOUNTER — Ambulatory Visit (HOSPITAL_COMMUNITY): Payer: Medicare HMO | Admitting: Physical Therapy

## 2018-01-15 DIAGNOSIS — Z0000 Encounter for general adult medical examination without abnormal findings: Secondary | ICD-10-CM | POA: Diagnosis not present

## 2018-01-15 DIAGNOSIS — E538 Deficiency of other specified B group vitamins: Secondary | ICD-10-CM | POA: Diagnosis not present

## 2018-01-15 DIAGNOSIS — M25672 Stiffness of left ankle, not elsewhere classified: Secondary | ICD-10-CM

## 2018-01-15 DIAGNOSIS — Z23 Encounter for immunization: Secondary | ICD-10-CM | POA: Diagnosis not present

## 2018-01-15 DIAGNOSIS — M25572 Pain in left ankle and joints of left foot: Secondary | ICD-10-CM | POA: Diagnosis not present

## 2018-01-15 NOTE — Therapy (Signed)
[place], [place], [address] Phone: [phone]   Fax:  [phone]  Physical Therapy Treatment  Patient Details  Name: [name] MRN: [id number] Date of Birth: Apr 21, 1933 Referring Provider (PT): Max Hyatt    Encounter Date: 01/15/2018  PT End of Session - 01/15/18 1611    Visit Number  15    Number of Visits  16    Date for PT Re-Evaluation  01/18/18    Authorization Type  AETNA    Authorization Time Period  10/30--01/18/18    Authorization - Visit Number  15    Authorization - Number of Visits  16    PT Start Time  8335    PT Stop Time  1610    PT Time Calculation (min)  40 min    Activity Tolerance  Patient tolerated treatment well;Patient limited by fatigue    Behavior During Therapy  [facility] for tasks assessed/performed       Past Medical History:  Diagnosis Date  . Abrasion of arm, left    secondary to fall   . Anemia   . Arthritis   . Breast cancer, left ([place])    er/pr +  . Cataract    no surgery as yet,starting of 3 years  . Chronic kidney disease    renal stones, one episode of Lithotripsy  . Cirrhosis (HCC)    Metavir score 4  . Complication of anesthesia   . Diabetes mellitus   . GERD (gastroesophageal reflux disease)   . Hyperlipidemia   . Hypertension    followed by  grp. relative to  urology study grp.  Doesn't report ever having a stress test   . Mental disorder   . Multiple falls   . Nocturia   . PONV (postoperative nausea and vomiting)   . Seasonal allergies   . Skin cancer    face- melanoma, 2012- surg. excision      Past Surgical History:  Procedure Laterality Date  . ABDOMINAL HYSTERECTOMY    . BACK SURGERY     x4 back surgery to include rods & screws  . Last in May 2014  . BREAST LUMPECTOMY WITH NEEDLE LOCALIZATION AND AXILLARY SENTINEL LYMPH NODE BX  03/12/2012   Procedure: BREAST LUMPECTOMY WITH NEEDLE LOCALIZATION AND AXILLARY SENTINEL LYMPH NODE BX;   Surgeon: Merrie Roof, MD;  Location: [facility];  Service: General;  Laterality: Left;  . BREAST SURGERY     lumpectomy x2/left   . CHOLECYSTECTOMY    . COLONOSCOPY N/A 11/20/2013   Dr.Rourk- diverticula was found in the left colon. o/w normal rectal, colonic and terminal ileal mucosa  . ESOPHAGOGASTRODUODENOSCOPY N/A 11/20/2013   Dr.Rourk- abnormal mucosa was found. diffuse snake skinning and friability of the gastric mucosa. patent pylorus. abnormal-appearing first, second and third portion of the duodenum. 107mm gastric nodule at the GE junction. stomach bx= minimal chronic infammation, GE junction bx= polypoid fragments of gastric cardia type mucosa w/mod chronic inflammation and foveolar hyperplasia  . EYE SURGERY     cataract surgery bilat   . FRACTURE SURGERY     1975- ORIF- L ankle   . GIVENS CAPSULE STUDY N/A 02/24/2014   Procedure: GIVENS CAPSULE STUDY;  Surgeon: Daneil Dolin, MD;  [facility];  Service: Endoscopy;  Laterality: N/A;  . RE-EXCISION OF BREAST LUMPECTOMY  03/22/2012   Procedure: RE-EXCISION OF BREAST LUMPECTOMY;  Surgeon: Merrie Roof, MD;  Location: [facility];  Service: General;  Laterality: Left;  re-excision left breast lumpectomy cavity  ER+, PR+  . ROTATOR CUFF REPAIR Bilateral   . SHOULDER SURGERY    . SKIN BIOPSY    . TOTAL KNEE ARTHROPLASTY Right 12/18/2014   Procedure: RIGHT TOTAL KNEE ARTHROPLASTY;  Surgeon: Sydnee Cabal, MD;  [facility];  Service: Orthopedics;  Laterality: Right;  . TUBAL LIGATION      There were no vitals filed for this visit.  Subjective Assessment - 01/15/18 1525    Subjective  Pt states that she is doing her exercises as good as she can.  She went to the MD today and she has been up on her feet all day     Pertinent History  Hx 1975 having Lt ankle surgery, DM, HTN, skin cancer, LBP, acute renal failure UTI's     Patient Stated Goals  less pain, to be able to walk better.     Currently in Pain?   Yes    Pain Score  4     Pain Location  Ankle    Pain Orientation  Left    Pain Descriptors / Indicators  Aching    Pain Onset  More than a month ago    Aggravating Factors   weight bearing     Pain Relieving Factors  medication,, heat or ice    Effect of Pain on Daily Activities  limits    Multiple Pain Sites  Yes                       [facility] Adult PT Treatment/Exercise - 01/15/18 0001      Exercises   Exercises  Ankle      Ankle Exercises: Stretches   Slant Board Stretch  3 reps;30 seconds      Ankle Exercises: Standing   SLS  NBOS  +UE flexion 2x10reps    Rebounder  bil tandem stance  2x30" holds, intermittent UE support    Heel Raises  15 reps    Toe Raise  15 reps    Other Standing Ankle Exercises  sidestep 1RT with intermittent HHA in // bars    Other Standing Ankle Exercises  step up onto 6" step x 10; lunging B x 10       Ankle Exercises: Seated   BAPS  Sitting;10 reps;Level 3    Other Seated Ankle Exercises  seated DF with 4# x 15       Ankle Exercises: Sidelying   Ankle Inversion  15 reps    Ankle Inversion Weights (lbs)  4    Ankle Eversion  15 reps    Ankle Eversion Weights (lbs)  4               PT Short Term Goals - 12/26/17 1124      PT SHORT TERM GOAL #1   Title  PT Lt DF to improve to 3 degrees dorsiflexion to allow pt to have a more normalized gait.     Time  2    Period  Weeks    Status  Achieved      PT SHORT TERM GOAL #2   Title  Core strength improved 1/2 grade to allow pt to stand more erect to take stress of both her back and her ankle.     Time  2    Period  Weeks    Status  On-going      PT SHORT TERM GOAL #  3   Title  Pt to be able to walk for 10 mintues with her rollator.     Time  2    Period  Weeks    Status  Achieved      PT SHORT TERM GOAL #4   Title  Pt edema to be decreased by 1.5 cm to allow improved flexibility     Time  2    Period  Weeks    Status  Achieved        PT Long Term Goals -  12/26/17 1125      PT LONG TERM GOAL #1   Title  Pt Dorsiflexion on left ankle to have increased to 8 degrees to allow normal motion of ankle while walking     Time  4    Period  Weeks    Status  On-going      PT LONG TERM GOAL #2   Title  PT Pain in her left ankle to be no greater than a 4/10 to allow pt to be up for 15 mintues at a time with her rollator to complete household tasks.     Time  4    Period  Weeks    Status  Achieved      PT LONG TERM GOAL #3   Title  PT to have lost 2-3 cm of volume from Lt LE to allow pt to don compression garment with greater ease.     Time  4    Period  Weeks    Status  Achieved      PT LONG TERM GOAL #4   Title  Pt to understand that she needs to wear compression garments on a regular basis to prevent increased edema.     Time  4    Period  Weeks    Status  Achieved      PT LONG TERM GOAL #5   Title  PT to be able to single leg stance for 5 seconds bilaterally to decrease risk of falling     Time  4    Period  Weeks    Status  On-going      PT LONG TERM GOAL #6   Title  Pt Lt ankle strength to be at least a 4+/5 to allow improved stability while walking     Time  4    Period  Weeks    Status  On-going            Plan - 01/15/18 1611    Clinical Impression Statement  Treatment continued to focus on strengthening and increasing the stability of [name] Left ankle.  Therapist was able to increase reps and able to complete program with less rest breaks today.     Rehab Potential  Good    PT Frequency  2x / week    PT Duration  3 weeks   3x/week for 4 weeks then 2x/week for 3 more total of 7 weeks    PT Treatment/Interventions  ADLs/Self Care Home Management;Therapeutic exercise;Balance training;Neuromuscular re-education;Cognitive remediation;Patient/family education;Manual techniques;Manual lymph drainage;Compression bandaging;Passive range of motion    PT Next Visit Plan  reassess for MD visit; most likely discharge.     PT  Home Exercise Plan  ABC, Sitting Dorsi/plantarflexion, supine ROM exercises. T band exercises     Consulted and Agree with Plan of Care  Patient       Patient will benefit from skilled therapeutic intervention in order to improve the following deficits  and impairments:  Abnormal gait, Decreased activity tolerance, Decreased balance, Decreased mobility, Decreased range of motion, Decreased strength, Difficulty walking, Hypomobility, Increased edema, Postural dysfunction, Obesity  Visit Diagnosis: Stiffness of left ankle, not elsewhere classified  Pain in left ankle and joints of left foot     Problem List Patient Active Problem List   Diagnosis Date Noted  . Altered mental status 08/18/2017  . Acute encephalopathy 08/17/2017  . Encephalopathy acute 08/17/2017  . Encephalopathy 07/25/2017  . Diabetes mellitus ([place]) 09/02/2015  . Anemia due to multiple mechanisms 02/17/2015  . Chronic LBP 02/17/2015  . Impaired renal function 02/17/2015  . Abducens nerve weakness 02/17/2015  . S/P knee replacement 12/18/2014  . Osteoarthritis of right knee 12/18/2014  . IDA (iron deficiency anemia) 10/29/2013  . Vitamin B12 deficiency anemia 09/09/2013  . Anemia, iron deficiency 09/09/2013  . Lumbago 08/06/2013  . Difficulty in walking(719.7) 08/06/2013  . Osteopenia 03/11/2013  . Malignant neoplasm of lower-inner quadrant of female breast ([place]) 03/10/2013  . Bilateral leg weakness 09/23/2012  . Acute renal failure ([place]) 07/25/2012  . Dehydration 07/25/2012  . DM type 2, uncontrolled, with neuropathy ([place]) 07/25/2012  . DM (diabetes mellitus), type 2, uncontrolled ([place]) 07/25/2012  . Essential hypertension, benign 07/25/2012  . Generalized weakness 07/25/2012  . Hyperlipidemia   . Chronic kidney disease   . Skin cancer   . Hypertension   . GERD (gastroesophageal reflux disease)   . Breast cancer ([place]) 02/19/2012  . CAP (community acquired pneumonia) 04/19/2011  . Nausea and vomiting  04/19/2011  . HTN (hypertension) 04/19/2011  . DM type 2 (diabetes mellitus, type 2) ([place]) 04/19/2011  . Arthritis 04/19/2011    Rayetta Humphrey, PT CLT [phone] 01/15/2018, 4:14 PM  [facility] [address] [place], [place], [address] Phone: [phone]   Fax:  [phone]  Name: [name] MRN: [id number] Date of Birth: 01/20/1934

## 2018-01-17 ENCOUNTER — Encounter (HOSPITAL_COMMUNITY): Payer: Self-pay | Admitting: Physical Therapy

## 2018-01-17 ENCOUNTER — Ambulatory Visit (HOSPITAL_COMMUNITY): Payer: Medicare HMO | Admitting: Physical Therapy

## 2018-01-17 DIAGNOSIS — M25672 Stiffness of left ankle, not elsewhere classified: Secondary | ICD-10-CM

## 2018-01-17 DIAGNOSIS — M25572 Pain in left ankle and joints of left foot: Secondary | ICD-10-CM

## 2018-01-17 NOTE — Therapy (Signed)
[place], [place], [address] Phone: [phone]   Fax:  [phone]  Physical Therapy Treatment  Patient Details  Name: [name] MRN: [id number] Date of Birth: 12/04/1933 Referring Provider (PT): [place] SUMMARY  Visits from Start of Care: 16  Current functional level related to goals / functional outcomes: See below   Remaining deficits: Main functioning deficits has more to do with pt chronic back issues than ankle    Education / Equipment: The importance of wearing compression on her LT LE on a daily basis as well as continuing her HEP  Plan: Patient agrees to discharge.  Patient goals were partially met. Patient is being discharged due to being pleased with the current functional level.  ?????       Encounter Date: 01/17/2018  PT End of Session - 01/17/18 1551    Visit Number  16    Number of Visits  16    Date for PT Re-Evaluation  01/18/18    Authorization Type  AETNA    Authorization Time Period  10/30--01/18/18    Authorization - Visit Number  16    Authorization - Number of Visits  16    PT Start Time  1520    PT Stop Time  1551    PT Time Calculation (min)  31 min    Activity Tolerance  Patient tolerated treatment well;Patient limited by fatigue    Behavior During Therapy  [facility] for tasks assessed/performed       Past Medical History:  Diagnosis Date  . Abrasion of arm, left    secondary to fall   . Anemia   . Arthritis   . Breast cancer, left ([place])    er/pr +  . Cataract    no surgery as yet,starting of 3 years  . Chronic kidney disease    renal stones, one episode of Lithotripsy  . Cirrhosis (HCC)    Metavir score 4  . Complication of anesthesia   . Diabetes mellitus   . GERD (gastroesophageal reflux disease)   . Hyperlipidemia   . Hypertension    followed by [facility] grp. relative to  urology study grp.  Doesn't report ever having a  stress test   . Mental disorder   . Multiple falls   . Nocturia   . PONV (postoperative nausea and vomiting)   . Seasonal allergies   . Skin cancer    face- melanoma, 2012- surg. excision      Past Surgical History:  Procedure Laterality Date  . ABDOMINAL HYSTERECTOMY    . BACK SURGERY     x4 back surgery to include rods & screws  . Last in May 2014  . BREAST LUMPECTOMY WITH NEEDLE LOCALIZATION AND AXILLARY SENTINEL LYMPH NODE BX  03/12/2012   Procedure: BREAST LUMPECTOMY WITH NEEDLE LOCALIZATION AND AXILLARY SENTINEL LYMPH NODE BX;  Surgeon: Merrie Roof, MD;  Location: [facility];  Service: General;  Laterality: Left;  . BREAST SURGERY     lumpectomy x2/left   . CHOLECYSTECTOMY    . COLONOSCOPY N/A 11/20/2013   Dr.Rourk- diverticula was found in the left colon. o/w normal rectal, colonic and terminal ileal mucosa  . ESOPHAGOGASTRODUODENOSCOPY N/A 11/20/2013   Dr.Rourk- abnormal mucosa was found. diffuse snake skinning and friability of the gastric mucosa. patent pylorus. abnormal-appearing first, second and third portion of the duodenum. 39m gastric nodule at the GE junction. stomach bx= minimal  chronic infammation, GE junction bx= polypoid fragments of gastric cardia type mucosa w/mod chronic inflammation and foveolar hyperplasia  . EYE SURGERY     cataract surgery bilat   . FRACTURE SURGERY     1975- ORIF- L ankle   . GIVENS CAPSULE STUDY N/A 02/24/2014   Procedure: GIVENS CAPSULE STUDY;  Surgeon: Daneil Dolin, MD;  [facility];  Service: Endoscopy;  Laterality: N/A;  . RE-EXCISION OF BREAST LUMPECTOMY  03/22/2012   Procedure: RE-EXCISION OF BREAST LUMPECTOMY;  Surgeon: Merrie Roof, MD;  Location: [facility];  Service: General;  Laterality: Left;  re-excision left breast lumpectomy cavity  ER+, PR+  . ROTATOR CUFF REPAIR Bilateral   . SHOULDER SURGERY    . SKIN BIOPSY    . TOTAL KNEE ARTHROPLASTY Right 12/18/2014   Procedure: RIGHT TOTAL KNEE  ARTHROPLASTY;  Surgeon: Sydnee Cabal, MD;  [facility];  Service: Orthopedics;  Laterality: Right;  . TUBAL LIGATION      There were no vitals filed for this visit.  Subjective Assessment - 01/17/18 1519    Subjective  Pt states that she is doing her exercises as good as she can.  She went to the MD today and she has been up on her feet all day     Pertinent History  Hx 1975 having Lt ankle surgery, DM, HTN, skin cancer, LBP, acute renal failure UTI's     How long can you sit comfortably?  no problem     How long can you stand comfortably?  Pt has a bench to sit on in the shower she is unable to stand greater than five minutes due to back pain not ankle related.      How long can you walk comfortably?  walks with a rollator;  limited in walking mainly due to chronic back pain but can be up for 15-20 minutes now     Patient Stated Goals  less pain, to be able to walk better.     Currently in Pain?  Yes    Pain Score  4     Pain Location  Ankle    Pain Orientation  Left    Pain Descriptors / Indicators  Tightness    Pain Type  Chronic pain    Pain Onset  More than a month ago    Pain Frequency  Intermittent    Aggravating Factors   movement    Pain Relieving Factors  rest     Effect of Pain on Daily Activities  limits          [facility] PT Assessment - 01/17/18 0001      Assessment   Medical Diagnosis  LT ankle pain/difficulty walking     Referring Provider (PT)  Max Hyatt     Onset Date/Surgical Date  08/27/17    Next MD Visit  01/22/2018    Prior Therapy  none for this reason      Precautions   Precautions  Fall      Restrictions   Weight Bearing Restrictions  No      [place] residence    Type of [place] to enter    Entrance Stairs-Number of Steps  2    [place]  One level      Prior Function   Level of [place]  Retired  Leisure  read      Cognition   Overall  Cognitive Status  Within Functional Limits for tasks assessed      Observation/Other Assessments   Focus on Therapeutic Outcomes (FOTO)   56 was 53       Functional Tests   Functional tests  Single leg stance;Sit to Stand      Single Leg Stance   Comments  RT:  0  LT:  0   was 0     Sit to Stand   Comments  5x 25.62 was 24.71      AROM   Right Ankle Dorsiflexion  8    Right Ankle Plantar Flexion  60    Right Ankle Inversion  20    Right Ankle Eversion  25    Left Ankle Dorsiflexion  8   was -3    Left Ankle Plantar Flexion  50   was 40   Left Ankle Inversion  23   was 10    Left Ankle Eversion  22   was 20     Strength   Right Ankle Dorsiflexion  5/5   was 4+   Right Ankle Plantar Flexion  4+/5   was 4+    Right Ankle Inversion  4/5    Right Ankle Eversion  3+/5    Left Ankle Dorsiflexion  5/5   was 4   Left Ankle Plantar Flexion  4+/5   was 4+   Left Ankle Inversion  4/5   was 4-   Left Ankle Eversion  3+/5   was 3-     Ambulation/Gait   Gait Comments  Pt walks forward bent weight is no longer on forearms but on hands.        LYMPHEDEMA/ONCOLOGY QUESTIONNAIRE - 01/17/18 1530      Left Lower Extremity Lymphedema   At Midpatella/Popliteal Crease  45.7 cm   was 45.7   30 cm Proximal to Floor at Lateral Plantar Foot  40.2 cm   was 44.3   20 cm Proximal to Floor at Lateral Plantar Foot  33.8 cm   was 34.6   10 cm Proximal to Floor at Lateral Malleoli  25 cm   was 26.5                       PT Education - 01/17/18 1546    Education Details  to continue her HEP.    Person(s) Educated  Patient    Methods  Explanation    Comprehension  Verbalized understanding       PT Short Term Goals - 01/17/18 1545      PT SHORT TERM GOAL #1   Title  PT Lt DF to improve to 3 degrees dorsiflexion to allow pt to have a more normalized gait.     Time  2    Period  Weeks    Status  Achieved      PT SHORT TERM GOAL #2   Title  Core strength  improved 1/2 grade to allow pt to stand more erect to take stress of both her back and her ankle.     Time  2    Period  Weeks    Status  Achieved      PT SHORT TERM GOAL #3   Title  Pt to be able to walk for 10 mintues with her rollator.     Time  2    Period  Weeks  Status  Achieved      PT SHORT TERM GOAL #4   Title  Pt edema to be decreased by 1.5 cm to allow improved flexibility     Time  2    Period  Weeks    Status  Achieved        PT Long Term Goals - 01/17/18 1543      PT LONG TERM GOAL #1   Title  Pt Dorsiflexion on left ankle to have increased to 8 degrees to allow normal motion of ankle while walking     Time  4    Period  Weeks    Status  Achieved      PT LONG TERM GOAL #2   Title  PT Pain in her left ankle to be no greater than a 4/10 to allow pt to be up for 15 mintues at a time with her rollator to complete household tasks.     Time  4    Period  Weeks    Status  Partially Met   Highest pain has been in the mornings  will be a 6/10 but will work out ; pt can be up for 15 minutes at a time.      PT LONG TERM GOAL #3   Title  PT to have lost 2-3 cm of volume from Lt LE to allow pt to don compression garment with greater ease.     Time  4    Period  Weeks    Status  Achieved      PT LONG TERM GOAL #4   Title  Pt to understand that she needs to wear compression garments on a regular basis to prevent increased edema.     Time  4    Period  Weeks    Status  Achieved      PT LONG TERM GOAL #5   Title  PT to be able to single leg stance for 5 seconds bilaterally to decrease risk of falling     Time  4    Period  Weeks    Status  Not Met      PT LONG TERM GOAL #6   Title  Pt Lt ankle strength to be at least a 4+/5 to allow improved stability while walking     Time  4    Period  Weeks    Status  Partially Met            Plan - 01/17/18 1551    Clinical Impression Statement  PT reassessed this visit with 4/4 short term goals met; 3/5 long term  goals met with 2/5 partially met.  Therapist and pt agree that pt has most likely achieved maximal benefit from therapy at this time.  She continues to complete her HEP at home and dons her compression garment everyday.  Pt will be discharged at this time.     Rehab Potential  Good    PT Frequency  2x / week    PT Duration  3 weeks   3x/week for 4 weeks then 2x/week for 3 more total of 7 weeks    PT Treatment/Interventions  ADLs/Self Care Home Management;Therapeutic exercise;Balance training;Neuromuscular re-education;Cognitive remediation;Patient/family education;Manual techniques;Manual lymph drainage;Compression bandaging;Passive range of motion    PT Next Visit Plan  Discharge.     PT Home Exercise Plan  ABC, Sitting Dorsi/plantarflexion, supine ROM exercises. T band exercises     Consulted and Agree with Plan of Care  Patient  Patient will benefit from skilled therapeutic intervention in order to improve the following deficits and impairments:  Abnormal gait, Decreased activity tolerance, Decreased balance, Decreased mobility, Decreased range of motion, Decreased strength, Difficulty walking, Hypomobility, Increased edema, Postural dysfunction, Obesity  Visit Diagnosis: Stiffness of left ankle, not elsewhere classified  Pain in left ankle and joints of left foot     Problem List Patient Active Problem List   Diagnosis Date Noted  . Altered mental status 08/18/2017  . Acute encephalopathy 08/17/2017  . Encephalopathy acute 08/17/2017  . Encephalopathy 07/25/2017  . Diabetes mellitus ([place]) 09/02/2015  . Anemia due to multiple mechanisms 02/17/2015  . Chronic LBP 02/17/2015  . Impaired renal function 02/17/2015  . Abducens nerve weakness 02/17/2015  . S/P knee replacement 12/18/2014  . Osteoarthritis of right knee 12/18/2014  . IDA (iron deficiency anemia) 10/29/2013  . Vitamin B12 deficiency anemia 09/09/2013  . Anemia, iron deficiency 09/09/2013  . Lumbago 08/06/2013   . Difficulty in walking(719.7) 08/06/2013  . Osteopenia 03/11/2013  . Malignant neoplasm of lower-inner quadrant of female breast ([address]) 03/10/2013  . Bilateral leg weakness 09/23/2012  . Acute renal failure ([place]) 07/25/2012  . Dehydration 07/25/2012  . DM type 2, uncontrolled, with neuropathy ([place]) 07/25/2012  . DM (diabetes mellitus), type 2, uncontrolled ([place]) 07/25/2012  . Essential hypertension, benign 07/25/2012  . Generalized weakness 07/25/2012  . Hyperlipidemia   . Chronic kidney disease   . Skin cancer   . Hypertension   . GERD (gastroesophageal reflux disease)   . Breast cancer ([place]) 02/19/2012  . CAP (community acquired pneumonia) 04/19/2011  . Nausea and vomiting 04/19/2011  . HTN (hypertension) 04/19/2011  . DM type 2 (diabetes mellitus, type 2) ([place]) 04/19/2011  . Arthritis 04/19/2011    Rayetta Humphrey, PT CLT [phone] 01/17/2018, 3:54 PM  [facility] [address] [place], [place], [address] Phone: [phone]   Fax:  [phone]  Name: [name] MRN: [id number] Date of Birth: 07/06/1933

## 2018-01-21 DIAGNOSIS — N183 Chronic kidney disease, stage 3 (moderate): Secondary | ICD-10-CM | POA: Diagnosis not present

## 2018-01-21 DIAGNOSIS — K219 Gastro-esophageal reflux disease without esophagitis: Secondary | ICD-10-CM | POA: Diagnosis not present

## 2018-01-21 DIAGNOSIS — R69 Illness, unspecified: Secondary | ICD-10-CM | POA: Diagnosis not present

## 2018-01-21 DIAGNOSIS — I129 Hypertensive chronic kidney disease with stage 1 through stage 4 chronic kidney disease, or unspecified chronic kidney disease: Secondary | ICD-10-CM | POA: Diagnosis not present

## 2018-01-21 DIAGNOSIS — M129 Arthropathy, unspecified: Secondary | ICD-10-CM | POA: Diagnosis not present

## 2018-01-21 DIAGNOSIS — I10 Essential (primary) hypertension: Secondary | ICD-10-CM | POA: Diagnosis not present

## 2018-01-21 DIAGNOSIS — C50919 Malignant neoplasm of unspecified site of unspecified female breast: Secondary | ICD-10-CM | POA: Diagnosis not present

## 2018-01-21 DIAGNOSIS — E538 Deficiency of other specified B group vitamins: Secondary | ICD-10-CM | POA: Diagnosis not present

## 2018-01-21 DIAGNOSIS — E1165 Type 2 diabetes mellitus with hyperglycemia: Secondary | ICD-10-CM | POA: Diagnosis not present

## 2018-01-21 DIAGNOSIS — Z0000 Encounter for general adult medical examination without abnormal findings: Secondary | ICD-10-CM | POA: Diagnosis not present

## 2018-01-22 ENCOUNTER — Ambulatory Visit: Payer: Medicare HMO | Admitting: Podiatry

## 2018-01-22 ENCOUNTER — Encounter: Payer: Self-pay | Admitting: Podiatry

## 2018-01-22 DIAGNOSIS — B351 Tinea unguium: Secondary | ICD-10-CM

## 2018-01-22 DIAGNOSIS — M7752 Other enthesopathy of left foot: Secondary | ICD-10-CM

## 2018-01-22 DIAGNOSIS — E1165 Type 2 diabetes mellitus with hyperglycemia: Secondary | ICD-10-CM

## 2018-01-22 DIAGNOSIS — M79676 Pain in unspecified toe(s): Secondary | ICD-10-CM | POA: Diagnosis not present

## 2018-01-22 DIAGNOSIS — E1140 Type 2 diabetes mellitus with diabetic neuropathy, unspecified: Secondary | ICD-10-CM | POA: Diagnosis not present

## 2018-01-22 DIAGNOSIS — IMO0002 Reserved for concepts with insufficient information to code with codable children: Secondary | ICD-10-CM

## 2018-01-22 NOTE — Progress Notes (Signed)
She presents today for a chief complaint of painful elongated toenails bilaterally.  States that physical therapy is going good to help with her ankle and her stability states that more than likely is her back and less her ankle is very happy with a compressive support that she has obtained.  Objective: Vital signs are stable she is alert and oriented x3.  Pulses are palpable.  Toenails are long thick yellow dystrophic-like mycotic painful palpation as well as debridement.  Assessment: Pain in limb center onychomycosis.  Plan: Debridement of toenails 1 through 5 bilateral.

## 2018-02-19 ENCOUNTER — Inpatient Hospital Stay (HOSPITAL_COMMUNITY): Admission: RE | Admit: 2018-02-19 | Payer: Self-pay | Source: Ambulatory Visit

## 2018-02-19 ENCOUNTER — Other Ambulatory Visit (HOSPITAL_COMMUNITY): Payer: Self-pay

## 2018-02-19 ENCOUNTER — Encounter (HOSPITAL_COMMUNITY): Payer: Self-pay

## 2018-02-26 ENCOUNTER — Other Ambulatory Visit (HOSPITAL_COMMUNITY): Payer: Self-pay | Admitting: Pulmonary Disease

## 2018-02-26 ENCOUNTER — Encounter (HOSPITAL_COMMUNITY): Payer: Self-pay

## 2018-02-26 ENCOUNTER — Ambulatory Visit (HOSPITAL_COMMUNITY): Payer: Medicare HMO

## 2018-02-26 ENCOUNTER — Ambulatory Visit (HOSPITAL_COMMUNITY)
Admission: RE | Admit: 2018-02-26 | Discharge: 2018-02-26 | Disposition: A | Discharge status: Home / Self Care | Payer: Medicare HMO | Source: Ambulatory Visit | Attending: Pulmonary Disease | Admitting: Pulmonary Disease

## 2018-02-26 DIAGNOSIS — Z853 Personal history of malignant neoplasm of breast: Secondary | ICD-10-CM | POA: Insufficient documentation

## 2018-02-26 DIAGNOSIS — Z9889 Other specified postprocedural states: Secondary | ICD-10-CM | POA: Diagnosis present

## 2018-02-26 DIAGNOSIS — N6342 Unspecified lump in left breast, subareolar: Secondary | ICD-10-CM | POA: Insufficient documentation

## 2018-02-26 DIAGNOSIS — R928 Other abnormal and inconclusive findings on diagnostic imaging of breast: Secondary | ICD-10-CM | POA: Diagnosis not present

## 2018-02-26 DIAGNOSIS — N6320 Unspecified lump in the left breast, unspecified quadrant: Secondary | ICD-10-CM | POA: Diagnosis not present

## 2018-03-05 ENCOUNTER — Ambulatory Visit (HOSPITAL_COMMUNITY)
Admission: RE | Admit: 2018-03-05 | Discharge: 2018-03-05 | Disposition: A | Discharge status: Home / Self Care | Payer: Medicare HMO | Source: Ambulatory Visit | Attending: Pulmonary Disease | Admitting: Pulmonary Disease

## 2018-03-05 ENCOUNTER — Other Ambulatory Visit (HOSPITAL_COMMUNITY): Payer: Self-pay | Admitting: Pulmonary Disease

## 2018-03-05 DIAGNOSIS — C773 Secondary and unspecified malignant neoplasm of axilla and upper limb lymph nodes: Secondary | ICD-10-CM | POA: Insufficient documentation

## 2018-03-05 DIAGNOSIS — Z9889 Other specified postprocedural states: Secondary | ICD-10-CM

## 2018-03-05 DIAGNOSIS — Z170 Estrogen receptor positive status [ER+]: Secondary | ICD-10-CM | POA: Diagnosis not present

## 2018-03-05 DIAGNOSIS — N6320 Unspecified lump in the left breast, unspecified quadrant: Secondary | ICD-10-CM | POA: Diagnosis present

## 2018-03-05 DIAGNOSIS — R928 Other abnormal and inconclusive findings on diagnostic imaging of breast: Secondary | ICD-10-CM

## 2018-03-05 DIAGNOSIS — C50112 Malignant neoplasm of central portion of left female breast: Secondary | ICD-10-CM | POA: Diagnosis not present

## 2018-03-05 DIAGNOSIS — N6342 Unspecified lump in left breast, subareolar: Secondary | ICD-10-CM | POA: Diagnosis not present

## 2018-03-05 DIAGNOSIS — R590 Localized enlarged lymph nodes: Secondary | ICD-10-CM | POA: Diagnosis not present

## 2018-03-05 DIAGNOSIS — C50012 Malignant neoplasm of nipple and areola, left female breast: Secondary | ICD-10-CM | POA: Insufficient documentation

## 2018-03-05 MED ORDER — SODIUM BICARBONATE 4 % IV SOLN
INTRAVENOUS | Status: AC
  Administered 2018-03-05: 14:00:00
  Filled 2018-03-05: qty 5

## 2018-03-05 MED ORDER — LIDOCAINE HCL (PF) 2 % IJ SOLN
INTRAMUSCULAR | Status: AC
  Administered 2018-03-05: 14:00:00
  Filled 2018-03-05: qty 10

## 2018-03-05 MED ORDER — LIDOCAINE-EPINEPHRINE (PF) 1 %-1:200000 IJ SOLN
INTRAMUSCULAR | Status: AC
  Administered 2018-03-05: 14:00:00
  Filled 2018-03-05: qty 30

## 2018-03-11 DIAGNOSIS — R69 Illness, unspecified: Secondary | ICD-10-CM | POA: Diagnosis not present

## 2018-03-14 DIAGNOSIS — C50512 Malignant neoplasm of lower-outer quadrant of left female breast: Secondary | ICD-10-CM | POA: Diagnosis not present

## 2018-03-14 DIAGNOSIS — Z170 Estrogen receptor positive status [ER+]: Secondary | ICD-10-CM | POA: Diagnosis not present

## 2018-03-19 ENCOUNTER — Encounter: Payer: Self-pay | Admitting: Oncology

## 2018-03-21 ENCOUNTER — Ambulatory Visit: Payer: Self-pay | Admitting: General Surgery

## 2018-03-21 DIAGNOSIS — Z170 Estrogen receptor positive status [ER+]: Principal | ICD-10-CM

## 2018-03-21 DIAGNOSIS — C50312 Malignant neoplasm of lower-inner quadrant of left female breast: Secondary | ICD-10-CM

## 2018-03-22 ENCOUNTER — Other Ambulatory Visit: Payer: Self-pay | Admitting: General Surgery

## 2018-03-22 DIAGNOSIS — C50312 Malignant neoplasm of lower-inner quadrant of left female breast: Secondary | ICD-10-CM

## 2018-03-22 DIAGNOSIS — Z170 Estrogen receptor positive status [ER+]: Principal | ICD-10-CM

## 2018-03-25 ENCOUNTER — Encounter: Payer: Self-pay | Admitting: Podiatry

## 2018-03-25 ENCOUNTER — Ambulatory Visit: Payer: Medicare HMO | Admitting: Podiatry

## 2018-03-25 DIAGNOSIS — B351 Tinea unguium: Secondary | ICD-10-CM

## 2018-03-25 DIAGNOSIS — M79676 Pain in unspecified toe(s): Secondary | ICD-10-CM | POA: Diagnosis not present

## 2018-03-25 NOTE — Progress Notes (Signed)
Subjective: [name] presents today with history of neuropathy with cc of painful, mycotic toenails.  Pain is aggravated when wearing enclosed shoe gear and relieved with periodic professional debridement.  Patient has peripheral neuropathy managed with gabapentin.  Sinda Du, MD is her PCP and her last visit was January 08, 2018.   Current Outpatient Medications:  .  ALPHAGAN P 0.1 % SOLN, Place 1 drop into both eyes 2 (two) times daily. , Disp: , Rfl:  .  amLODipine (NORVASC) 5 MG tablet, Take 5 mg by mouth every morning. , Disp: , Rfl:  .  amoxicillin-clavulanate (AUGMENTIN) 500-125 MG tablet, Take 1 tablet (500 mg total) by mouth 2 (two) times daily., Disp: 10 tablet, Rfl: 0 .  anastrozole (ARIMIDEX) 1 MG tablet, TAKE ONE (1) TABLET EACH DAY, Disp: , Rfl:  .  aspirin EC 81 MG tablet, Take 81 mg by mouth daily., Disp: , Rfl:  .  atorvastatin (LIPITOR) 10 MG tablet, Take 10 mg by mouth every evening. , Disp: , Rfl:  .  Bacillus Coagulans-Inulin (PROBIOTIC FORMULA PO), Take 1 capsule by mouth daily., Disp: , Rfl:  .  BD PEN NEEDLE NANO U/F 32G X 4 MM MISC, , Disp: , Rfl:  .  benazepril (LOTENSIN) 20 MG tablet, Take by mouth., Disp: , Rfl:  .  cyanocobalamin (,VITAMIN B-12,) 1000 MCG/ML injection, Inject 1,000 mcg into the muscle every 30 (thirty) days. , Disp: , Rfl:  .  dorzolamide (TRUSOPT) 2 % ophthalmic solution, PLACE ONE DROP IN [facility] EYE TWICE DAILY, Disp: , Rfl: 4 .  EPIPEN 2-PAK 0.3 MG/0.3ML SOAJ injection, Inject 0.3 mg into the muscle once. , Disp: , Rfl:  .  gabapentin (NEURONTIN) 600 MG tablet, Take 600 mg by mouth at bedtime. , Disp: , Rfl:  .  glipiZIDE (GLUCOTROL) 5 MG tablet, Take 5 mg by mouth daily before breakfast. , Disp: , Rfl:  .  glipiZIDE-metformin (METAGLIP) 2.5-500 MG tablet, Take by mouth., Disp: , Rfl:  .  hydrALAZINE (APRESOLINE) 25 MG tablet, Take 25-50 mg by mouth See admin instructions. 50mg  in the morning and 25mg  at bedtime, Disp: , Rfl:  .   insulin glargine (LANTUS) 100 UNIT/ML injection, Inject into the skin., Disp: , Rfl:  .  irbesartan (AVAPRO) 150 MG tablet, Take 150 mg by mouth daily., Disp: , Rfl:  .  latanoprost (XALATAN) 0.005 % ophthalmic solution, , Disp: , Rfl:  .  metFORMIN (GLUCOPHAGE) 1000 MG tablet, , Disp: , Rfl:  .  metoprolol succinate (TOPROL-XL) 100 MG 24 hr tablet, Take 100 mg by mouth every morning. Take with or immediately following a meal., Disp: , Rfl:  .  mirtazapine (REMERON) 15 MG tablet, Take by mouth at bedtime. , Disp: , Rfl:  .  MYRBETRIQ 50 MG TB24 tablet, , Disp: , Rfl:  .  omeprazole (PRILOSEC) 20 MG capsule, Take 20 mg by mouth daily., Disp: , Rfl:  .  ONE TOUCH ULTRA TEST test strip, , Disp: , Rfl:  .  Oxycodone HCl 10 MG TABS, Take 10 mg by mouth 3 (three) times daily. , Disp: , Rfl:  .  sitaGLIPtin (JANUVIA) 100 MG tablet, Take by mouth., Disp: , Rfl:  .  torsemide (DEMADEX) 20 MG tablet, Take 1 tablet (20 mg total) by mouth daily., Disp: 30 tablet, Rfl: 5 .  TRESIBA FLEXTOUCH 100 UNIT/ML SOPN FlexTouch Pen, , Disp: , Rfl:   Allergies  Allergen Reactions  . Bee Venom Anaphylaxis  . Hydromorphone Hcl  Other reaction(s): Agitation, Confusion  . Demerol Nausea Only  . Other Other (See Comments)    Bee stings-swelling    Objective:  Vascular Examination: Capillary refill time less than 3 seconds x 10 digits Dorsalis pedis and Posterior tibial pulses are palpable bilaterally Digital hair x 10 digits was absent Skin temperature gradient WNL b/l  Dermatological Examination: Skin with normal turgor, texture and tone b/l  Toenails 1-5 b/l discolored, thick, dystrophic with subungual debris and pain with palpation to nailbeds due to thickness of nails.  Musculoskeletal: Muscle strength 5/5 to all muscle groups b/l  She is wearing a Velcro leg wrap on the left lower extremity.  Neurological: Sensation diminished with 10 g monofilament bilaterally  Assessment: 1. Painful  onychomycosis toenails 1-5 b/l 2. NIDDM with neuropathy  Plan: 1. Toenails 1-5 b/l were debrided in length and girth without iatrogenic bleeding. 2. Patient to continue soft, supportive shoe gear 3. Patient to report any pedal injuries to medical professional  4. Follow up 3 months.  5. Patient/POA to call should there be a concern in the interim.

## 2018-03-25 NOTE — Patient Instructions (Signed)
Diabetes Mellitus and Foot Care °Foot care is an important part of your health, especially when you have diabetes. Diabetes may cause you to have problems because of poor blood flow (circulation) to your feet and legs, which can cause your skin to: °· Become thinner and drier. °· Break more easily. °· Heal more slowly. °· Peel and crack. °You may also have nerve damage (neuropathy) in your legs and feet, causing decreased feeling in them. This means that you may not notice minor injuries to your feet that could lead to more serious problems. Noticing and addressing any potential problems early is the best way to prevent future foot problems. °How to care for your feet °Foot hygiene °· Wash your feet daily with warm water and mild soap. Do not use hot water. Then, pat your feet and the areas between your toes until they are completely dry. Do not soak your feet as this can dry your skin. °· Trim your toenails straight across. Do not dig under them or around the cuticle. File the edges of your nails with an emery board or nail file. °· Apply a moisturizing lotion or petroleum jelly to the skin on your feet and to dry, brittle toenails. Use lotion that does not contain alcohol and is unscented. Do not apply lotion between your toes. °Shoes and socks °· Wear clean socks or stockings every day. Make sure they are not too tight. Do not wear knee-high stockings since they may decrease blood flow to your legs. °· Wear shoes that fit properly and have enough cushioning. Always look in your shoes before you put them on to be sure there are no objects inside. °· To break in new shoes, wear them for just a few hours a day. This prevents injuries on your feet. °Wounds, scrapes, corns, and calluses °· Check your feet daily for blisters, cuts, bruises, sores, and redness. If you cannot see the bottom of your feet, use a mirror or ask someone for help. °· Do not cut corns or calluses or try to remove them with medicine. °· If you  find a minor scrape, cut, or break in the skin on your feet, keep it and the skin around it clean and dry. You may clean these areas with mild soap and water. Do not clean the area with peroxide, alcohol, or iodine. °· If you have a wound, scrape, corn, or callus on your foot, look at it several times a day to make sure it is healing and not infected. Check for: °? Redness, swelling, or pain. °? Fluid or blood. °? Warmth. °? Pus or a bad smell. °General instructions °· Do not cross your legs. This may decrease blood flow to your feet. °· Do not use heating pads or hot water bottles on your feet. They may burn your skin. If you have lost feeling in your feet or legs, you may not know this is happening until it is too late. °· Protect your feet from hot and cold by wearing shoes, such as at the beach or on hot pavement. °· Schedule a complete foot exam at least once a year (annually) or more often if you have foot problems. If you have foot problems, report any cuts, sores, or bruises to your health care provider immediately. °Contact a health care provider if: °· You have a medical condition that increases your risk of infection and you have any cuts, sores, or bruises on your feet. °· You have an injury that is not   healing. °· You have redness on your legs or feet. °· You feel burning or tingling in your legs or feet. °· You have pain or cramps in your legs and feet. °· Your legs or feet are numb. °· Your feet always feel cold. °· You have pain around a toenail. °Get help right away if: °· You have a wound, scrape, corn, or callus on your foot and: °? You have pain, swelling, or redness that gets worse. °? You have fluid or blood coming from the wound, scrape, corn, or callus. °? Your wound, scrape, corn, or callus feels warm to the touch. °? You have pus or a bad smell coming from the wound, scrape, corn, or callus. °? You have a fever. °? You have a red line going up your leg. °Summary °· Check your feet every day  for cuts, sores, red spots, swelling, and blisters. °· Moisturize feet and legs daily. °· Wear shoes that fit properly and have enough cushioning. °· If you have foot problems, report any cuts, sores, or bruises to your health care provider immediately. °· Schedule a complete foot exam at least once a year (annually) or more often if you have foot problems. °This information is not intended to replace advice given to you by your health care provider. Make sure you discuss any questions you have with your health care provider. °Document Released: 02/11/2000 Document Revised: 03/28/2017 Document Reviewed: 03/17/2016 °Elsevier Interactive Patient Education © 2019 [employer] ° °Diabetic Neuropathy °Diabetic neuropathy refers to nerve damage that is caused by diabetes (diabetes mellitus). Over time, people with diabetes can develop nerve damage throughout the body. There are several types of diabetic neuropathy: °· Peripheral neuropathy. This is the most common type of diabetic neuropathy. It causes damage to nerves that carry signals between the spinal cord and other parts of the body (peripheral nerves). This usually affects nerves in the feet and legs first, and may eventually affect the hands and arms. The damage affects the ability to sense touch or temperature. °· Autonomic neuropathy. This type causes damage to nerves that control involuntary functions (autonomic nerves). These nerves carry signals that control: °? Heartbeat. °? Body temperature. °? Blood pressure. °? Urination. °? Digestion. °? Sweating. °? Sexual function. °? Response to changing blood sugar (glucose) levels. °· Focal neuropathy. This type of nerve damage affects one area of the body, such as an arm, a leg, or the face. The injury may involve one nerve or a small group of nerves. Focal neuropathy can be painful and unpredictable, and occurs most often in older adults with diabetes. This often develops suddenly, but usually improves over time  and does not cause long-term problems. °· Proximal neuropathy. This type of nerve damage affects the nerves of the thighs, hips, buttocks, or legs. It causes severe pain, weakness, and muscle death (atrophy), usually in the thigh muscles. It is more common among older men and people who have type 2 diabetes. The length of recovery time may vary. °What are the causes? °Peripheral, autonomic, and focal neuropathies are caused by diabetes that is not well controlled with treatment. The cause of proximal neuropathy is not known, but it may be caused by inflammation related to uncontrolled blood glucose levels. °What are the signs or symptoms? °Peripheral neuropathy °Peripheral neuropathy develops slowly over time. When the nerves of the feet and legs no longer work, you may experience: °· Burning, stabbing, or aching pain in the legs or feet. °· Pain or cramping in the   legs or feet. °· Loss of feeling (numbness) and inability to feel pressure or pain in the feet. This can lead to: °? Thick calluses or sores on areas of constant pressure. °? Ulcers. °? Reduced ability to feel temperature changes. °· Foot deformities. °· Muscle weakness. °· Loss of balance or coordination. °Autonomic neuropathy °The symptoms of autonomic neuropathy vary depending on which nerves are affected. Symptoms may include: °· Problems with digestion, such as: °? Nausea or vomiting. °? Poor appetite. °? Bloating. °? Diarrhea or constipation. °? Trouble swallowing. °? Losing weight without trying to. °· Problems with the heart, blood and lungs, such as: °? Dizziness, especially when standing up. °? Fainting. °? Shortness of breath. °? Irregular heartbeat. °· Bladder problems, such as: °? Trouble starting or stopping urination. °? Leaking urine. °? Trouble emptying the bladder. °? Urinary tract infections (UTIs). °· Problems with other body functions, such as: °? Sweat. You may sweat too much or too little. °? Temperature. You might get hot easily.  Or, you might feel cold more than usual. °? Sexual function. Men may not be able to get or maintain an erection. Women may have vaginal dryness and difficulty with arousal. °Focal neuropathy °Symptoms affect only one area of the body. Common symptoms include: °· Numbness. °· Tingling. °· Burning pain. °· Prickling feeling. °· Very sensitive skin. °· Weakness. °· Inability to move (paralysis). °· Muscle twitching. °· Muscles getting smaller (wasting). °· Poor coordination. °· Double or blurred vision. °Proximal neuropathy °· Sudden, severe pain in the hip, thigh, or buttocks. Pain may spread from the back into the legs (sciatica). °· Pain and numbness in the arms and legs. °· Tingling. °· Loss of bladder control or bowel control. °· Weakness and wasting of thigh muscles. °· Difficulty getting up from a seated position. °· Abdominal swelling. °· Unexplained weight loss. °How is this diagnosed? °Diagnosis usually involves reviewing your medical history and any symptoms you have. Diagnosis varies depending on the type of neuropathy your health care provider suspects. °Peripheral neuropathy °Your health care provider will check areas that are affected by your nervous system (neurologic exam), such as your reflexes, how you move, and what you can feel. You may have other tests, such as: °· Blood tests. °· Removal and examination of fluid that surrounds the spinal cord (lumbar puncture). °· CT scan. °· MRI. °· A test to check the nerves that control muscles (electromyogram, EMG). °· Tests of how quickly messages pass through your nerves (nerve conduction velocity tests). °· Removal of a small piece of nerve to be examined under a microscope (biopsy). °Autonomic neuropathy °You may have tests, such as: °· Tests to measure your blood pressure and heart rate. This may include monitoring you while you are safely secured to an exam table that moves you from a lying position to an upright position (table tilt test). °· Breathing  tests to check your lungs. °· Tests to check how food moves through the digestive system (gastric emptying tests). °· Blood, sweat, or urine tests. °· Ultrasound of your bladder. °· Spinal fluid tests. °Focal neuropathy °This condition may be diagnosed with: °· A neurologic exam. °· CT scan. °· MRI. °· EMG. °· Nerve conduction velocity tests. °Proximal neuropathy °There is no test to diagnose this type of neuropathy. You may have tests to rule out other possible causes of this type of neuropathy. Tests may include: °· X-rays of your spine and lumbar region. °· Lumbar puncture. °· MRI. °How is this treated? °The   goal of treatment is to keep nerve damage from getting worse. The most important part of treatment is keeping your blood glucose level and your A1C level within your target range by following your diabetes management plan. Over time, maintaining lower blood glucose levels helps lessen symptoms. In some cases, you may need prescription pain medicine. °Follow these instructions at home: ° °Lifestyle ° °· Do not use any products that contain nicotine or tobacco, such as cigarettes and e-cigarettes. If you need help quitting, ask your health care provider. °· Be physically active every day. Include strength training and balance exercises. °· Follow a healthy meal plan. °· Work with your health care provider to manage your blood pressure. °General instructions °· Follow your diabetes management plan as directed. °? Check your blood glucose levels as directed by your health care provider. °? Keep your blood glucose in your target range as directed by your health care provider. °? Have your A1C level checked at least two times a year, or as often as told by your health care provider. °· Take over the counter and prescription medicines only as told by your health care provider. This includes insulin and diabetes medicine. °· Do not drive or use heavy machinery while taking prescription pain medicines. °· Check your  skin and feet every day for cuts, bruises, redness, blisters, or sores. °· Keep all follow up visits as told by your health care provider. This is important. °Contact a health care provider if: °· You have burning, stabbing, or aching pain in your legs or feet. °· You are unable to feel pressure or pain in your feet. °· You develop problems with digestion, such as: °? Nausea. °? Vomiting. °? Bloating. °? Constipation. °? Diarrhea. °? Abdominal pain. °· You have difficulty with urination, such as inability: °? To control when you urinate (incontinence). °? To completely empty the bladder (retention). °· You have palpitations. °· You feel dizzy, weak, or faint when you stand up. °Get help right away if: °· You cannot urinate. °· You have sudden weakness or loss of coordination. °· You have trouble speaking. °· You have pain or pressure in your chest. °· You have an irregular heart beat. °· You have sudden inability to move a part of your body. °Summary °· Diabetic neuropathy refers to nerve damage that is caused by diabetes. It can affect nerves throughout the entire body, causing numbness and pain in the arms, legs, digestive tract, heart, and other body systems. °· Keep your blood glucose level and your blood pressure in your target range, as directed by your health care provider. This can help prevent neuropathy from getting worse. °· Check your skin and feet every day for cuts, bruises, redness, blisters, or sores. °· Do not use any products that contain nicotine or tobacco, such as cigarettes and e-cigarettes. If you need help quitting, ask your health care provider. °This information is not intended to replace advice given to you by your health care provider. Make sure you discuss any questions you have with your health care provider. °Document Released: 04/24/2001 Document Revised: 03/28/2017 Document Reviewed: 03/20/2016 °Elsevier Interactive Patient Education © 2019 [employer] ° °

## 2018-03-27 DIAGNOSIS — E538 Deficiency of other specified B group vitamins: Secondary | ICD-10-CM | POA: Diagnosis not present

## 2018-03-29 NOTE — Progress Notes (Signed)
Location of Breast Cancer: Highly suspicious 1.6 cm RETROAREOLAR LEFT breast mass, and single abnormal LEFT axillary lymph node  Histology per Pathology Report: 03/05/18:  Diagnosis 1. Breast, left, needle core biopsy, retroareolar - INVASIVE DUCTAL CARCINOMA. - SEE COMMENT. 2. Lymph node, needle/core biopsy, left axillary - DUCTAL CARCINOMA. Pathology site 1: LEFT breast retroareolar region: Invasive ductal carcinoma  Receptor Status: ER(95%), PR (5%), Her2-neu (negative, 1+), Ki-(20%)  Did patient present with symptoms (if so, please note symptoms) or was this found on screening mammography?: annual bilateral mammograms  Past/Anticipated interventions by surgeon, if any: 04/10/18:  LEFT BREAST LUMPECTOMY [place] Jovita Kussmaul, MD  Past/Anticipated interventions by medical oncology, if any: Chemotherapy None at this time.  Lymphedema issues, if any:  Pt denies  Pain issues, if any:  Pt reports breast with rare tender to touch. Pt reports LEFT ankle pain, rated 3/10.   SAFETY ISSUES:  Prior radiation? No  Pacemaker/ICD? No  Possible current pregnancy? No  Is the patient on methotrexate? No  Current Complaints / other details:  Pt presents today for initial consult with Dr. Sondra Come. Pt is accompanied by daughter, [name], and son-in-law [name].   BP (!) 130/56 (BP Location: Right Arm, Patient Position: Sitting)   Pulse 88   Temp 99.2 F (37.3 C) (Oral)   Resp (!) 24   Ht 5' (1.524 m)   Wt 215 lb (97.5 kg)   SpO2 92%   BMI 41.99 kg/m   Wt Readings from Last 3 Encounters:  04/03/18 215 lb (97.5 kg)  08/21/17 209 lb 3.5 oz (94.9 kg)  07/26/17 195 lb 8.8 oz (88.7 kg)       Loma Sousa, RN 04/03/2018,2:59 PM  Problem #1 stage I (T1b N0) adenocarcinoma of the left breast, lower inner quadrant, status post lumpectomy followed by reexcision which revealed a small focus of DCIS less than 0.1 cm  from the lateral inked margin but no further reexcision was felt to be necessary after that surgery which took place on March 22, 2012. Her original surgery took place on March 12, 2012. Her tumor size was 9 mm, this was however a grade 3 tumor, ER receptors were 100%, progesterone receptors were 98%, HER-2/neu was negative. No LVI was seen and after consultation with radiation therapy it was decided along with medical oncology to give her adjuvant aromatase inhibitor therapy consisting of anastrozole without radiation therapy.

## 2018-04-03 ENCOUNTER — Ambulatory Visit
Admission: RE | Admit: 2018-04-03 | Discharge: 2018-04-03 | Disposition: A | Discharge status: Home / Self Care | Payer: Medicare HMO | Source: Ambulatory Visit | Attending: Radiation Oncology | Admitting: Radiation Oncology

## 2018-04-03 ENCOUNTER — Other Ambulatory Visit: Payer: Self-pay

## 2018-04-03 ENCOUNTER — Encounter: Payer: Self-pay | Admitting: Radiation Oncology

## 2018-04-03 ENCOUNTER — Encounter: Payer: Self-pay | Admitting: Oncology

## 2018-04-03 ENCOUNTER — Inpatient Hospital Stay: Payer: Medicare HMO | Attending: Oncology | Admitting: Oncology

## 2018-04-03 VITALS — BP 130/56 | HR 88 | Temp 99.2°F | Resp 24 | Ht 60.0 in | Wt 215.0 lb

## 2018-04-03 VITALS — BP 132/98 | HR 91 | Temp 98.8°F | Resp 18 | Ht 60.0 in | Wt 214.0 lb

## 2018-04-03 DIAGNOSIS — E1122 Type 2 diabetes mellitus with diabetic chronic kidney disease: Secondary | ICD-10-CM | POA: Insufficient documentation

## 2018-04-03 DIAGNOSIS — C50012 Malignant neoplasm of nipple and areola, left female breast: Secondary | ICD-10-CM | POA: Diagnosis not present

## 2018-04-03 DIAGNOSIS — E119 Type 2 diabetes mellitus without complications: Secondary | ICD-10-CM

## 2018-04-03 DIAGNOSIS — M1990 Unspecified osteoarthritis, unspecified site: Secondary | ICD-10-CM | POA: Diagnosis not present

## 2018-04-03 DIAGNOSIS — Z90710 Acquired absence of both cervix and uterus: Secondary | ICD-10-CM | POA: Insufficient documentation

## 2018-04-03 DIAGNOSIS — Z170 Estrogen receptor positive status [ER+]: Secondary | ICD-10-CM | POA: Insufficient documentation

## 2018-04-03 DIAGNOSIS — Z809 Family history of malignant neoplasm, unspecified: Secondary | ICD-10-CM | POA: Diagnosis not present

## 2018-04-03 DIAGNOSIS — Z7982 Long term (current) use of aspirin: Secondary | ICD-10-CM | POA: Insufficient documentation

## 2018-04-03 DIAGNOSIS — I10 Essential (primary) hypertension: Secondary | ICD-10-CM

## 2018-04-03 DIAGNOSIS — N189 Chronic kidney disease, unspecified: Secondary | ICD-10-CM | POA: Insufficient documentation

## 2018-04-03 DIAGNOSIS — I129 Hypertensive chronic kidney disease with stage 1 through stage 4 chronic kidney disease, or unspecified chronic kidney disease: Secondary | ICD-10-CM | POA: Insufficient documentation

## 2018-04-03 DIAGNOSIS — C50312 Malignant neoplasm of lower-inner quadrant of left female breast: Secondary | ICD-10-CM | POA: Diagnosis not present

## 2018-04-03 DIAGNOSIS — Z7984 Long term (current) use of oral hypoglycemic drugs: Secondary | ICD-10-CM

## 2018-04-03 DIAGNOSIS — Z79899 Other long term (current) drug therapy: Secondary | ICD-10-CM | POA: Insufficient documentation

## 2018-04-03 DIAGNOSIS — Z5111 Encounter for antineoplastic chemotherapy: Secondary | ICD-10-CM | POA: Diagnosis not present

## 2018-04-03 DIAGNOSIS — Z85820 Personal history of malignant melanoma of skin: Secondary | ICD-10-CM | POA: Diagnosis not present

## 2018-04-03 DIAGNOSIS — C50812 Malignant neoplasm of overlapping sites of left female breast: Secondary | ICD-10-CM

## 2018-04-03 DIAGNOSIS — C50912 Malignant neoplasm of unspecified site of left female breast: Secondary | ICD-10-CM

## 2018-04-03 DIAGNOSIS — C773 Secondary and unspecified malignant neoplasm of axilla and upper limb lymph nodes: Secondary | ICD-10-CM | POA: Diagnosis not present

## 2018-04-03 DIAGNOSIS — C50319 Malignant neoplasm of lower-inner quadrant of unspecified female breast: Secondary | ICD-10-CM

## 2018-04-03 NOTE — Progress Notes (Signed)
[place]  [phone] [phone]     ID: [name] DOB: 1933-10-19  MR#: [id number]  QQV#:[id number]  Patient Care Team: Sinda Du, MD as PCP - General (Internal Medicine) Gala Romney Cristopher Estimable, MD as Consulting Physician (Gastroenterology) Kalee Broxton, Virgie Dad, MD as Consulting Physician (Oncology) Jovita Kussmaul, MD as Consulting Physician (General Surgery) Gery Pray, MD as Consulting Physician (Radiation Oncology) Erline Levine, MD as Consulting Physician (Neurosurgery) Chauncey Cruel, MD OTHER MD:  CHIEF COMPLAINT: Recurrent estrogen receptor positive breast cancer  CURRENT TREATMENT: Neoadjuvant treatment   HISTORY OF CURRENT ILLNESS: [name] has a history of left breast invasive ductal carcinoma,  status post left lumpectomy and sentinel lymph node sampling 03/12/2012 for a pT1b pN0, stage IA invasive ductal carcinoma, grade 3, strongly estrogen and progesterone receptor positive, HER-2 nonamplified, with an MIB-1-1 of 15%.  The single sentinel lymph node was negative.  There was a positive margin.  She had reexcision on 03/22/2012 for what proved to be a single residual focus of intermediate grade ductal carcinoma in situ.  This was close to the lateral inked margin which was however negative.  She saw radiation oncology (Drs. Orlene Erm and Valere Dross) who felt adjuvant endocrine therapy alone would be adequate. She started anastrozole under Dr. Jacquiline Doe at [facility], on which she continues.  More recently she underwent routine bilateral diagnostic mammography with tomography and left breast ultrasonography at [facility] on 02/26/2018 showing: highly suspicious 1.6 cm retroareolar left breast mass, and single abnormal left axillary lymph node.  Accordingly on 03/05/2018 she proceeded to biopsy of the left breast area in question. The pathology ([id number]) from this procedure showed: invasive ductal carcinoma, grade 3;  ductal carcinoma in the lymph node tissue. Prognostic indicators significant for: estrogen receptor, 95% positive and progesterone receptor, 5% positive, both with strong staining intensity. Proliferation marker Ki67 at 20%. HER2 negative by immunohistochemistry, 1+.  The patient's subsequent history is as detailed below.   INTERVAL HISTORY: [name] was evaluated in the breast cancer clinic on 04/03/2018 accompanied by her daughter, [name], and son-in-law, [name].  Her case was presented at the multidisciplinary breast cancer conference on 03/20/2018.  At that time a preliminary plan was proposed: Surgery with targeted axillary dissection, consideration of radiation, and antiestrogens. She is scheduled for left lumpectomy with targeted lymph node dissection on 04/10/2018 under Dr. Marlou Starks.   REVIEW OF SYSTEMS: There were no specific symptoms leading to the original mammogram, which was routinely scheduled. The patient denies unusual headaches, visual changes, nausea, vomiting, stiff neck, or dizziness.  The patient has significant limitations because of multiple back surgeries, and uses a walker at all times.  She also has chronic pain issues, which while well controlled also limits her.  There has been no cough, phlegm production, or pleurisy, no chest pain or pressure, and no change in bowel or bladder habits. The patient denies fever, rash, bleeding, unexplained fatigue or unexplained weight loss. A detailed review of systems was otherwise noncontributory   PAST MEDICAL HISTORY: Past Medical History:  Diagnosis Date  . Abrasion of arm, left    secondary to fall   . Anemia   . Arthritis   . Breast cancer, left ([facility])    er/pr +  . Cataract    no surgery as yet,starting of 3 years  . Chronic kidney disease    renal stones, one episode of Lithotripsy  . Cirrhosis (HCC)    Metavir score 4  . Complication of  anesthesia   . Diabetes mellitus   . GERD (gastroesophageal reflux disease)   .  Hyperlipidemia   . Hypertension    followed by [facility]. relative to  urology study grp.  Doesn't report ever having a stress test   . Mental disorder   . Multiple falls   . Nocturia   . PONV (postoperative nausea and vomiting)   . Seasonal allergies   . Skin cancer    face- melanoma, 2012- surg. excision      PAST SURGICAL HISTORY: Past Surgical History:  Procedure Laterality Date  . ABDOMINAL HYSTERECTOMY    . BACK SURGERY     x4 back surgery to include rods & screws  . Last in May 2014  . BREAST LUMPECTOMY WITH NEEDLE LOCALIZATION AND AXILLARY SENTINEL LYMPH NODE BX  03/12/2012   Procedure: BREAST LUMPECTOMY WITH NEEDLE LOCALIZATION AND AXILLARY SENTINEL LYMPH NODE BX;  Surgeon: Merrie Roof, MD;  Location: [facility];  Service: General;  Laterality: Left;  . BREAST SURGERY     lumpectomy x2/left   . CHOLECYSTECTOMY    . COLONOSCOPY N/A 11/20/2013   Dr.Rourk- diverticula was found in the left colon. o/w normal rectal, colonic and terminal ileal mucosa  . ESOPHAGOGASTRODUODENOSCOPY N/A 11/20/2013   Dr.Rourk- abnormal mucosa was found. diffuse snake skinning and friability of the gastric mucosa. patent pylorus. abnormal-appearing first, second and third portion of the duodenum. 26m gastric nodule at the GE junction. stomach bx= minimal chronic infammation, GE junction bx= polypoid fragments of gastric cardia type mucosa w/mod chronic inflammation and foveolar hyperplasia  . EYE SURGERY     cataract surgery bilat   . FRACTURE SURGERY     1975- ORIF- L ankle   . GIVENS CAPSULE STUDY N/A 02/24/2014   Procedure: GIVENS CAPSULE STUDY;  Surgeon: RDaneil Dolin MD;  [facility];  Service: Endoscopy;  Laterality: N/A;  . RE-EXCISION OF BREAST LUMPECTOMY  03/22/2012   Procedure: RE-EXCISION OF BREAST LUMPECTOMY;  Surgeon: PMerrie Roof MD;  Location: [facility]  Service: General;  Laterality: Left;  re-excision left breast lumpectomy cavity  ER+, PR+  . ROTATOR  CUFF REPAIR Bilateral   . SHOULDER SURGERY    . SKIN BIOPSY    . TOTAL KNEE ARTHROPLASTY Right 12/18/2014   Procedure: RIGHT TOTAL KNEE ARTHROPLASTY;  Surgeon: RSydnee Cabal MD;  Location: [facility];  Service: Orthopedics;  Laterality: Right;  . TUBAL LIGATION      FAMILY HISTORY Family History  Problem Relation Age of Onset  . Stroke Mother        mini-strokes  . Heart disease Father   . Emphysema Father   . Cancer Brother        metastatic, unsure primary  . Colon cancer Neg Hx   . Prostate cancer Neg Hx    Patient father was 680years old when he died from "heart trouble" and emphysema. Patient mother died from mini-stroke complications at age 83 She denies a family hx of breast or ovarian cancer, prostate or pancreatic cancers. She has 3 siblings. She had a twin brother who died at age 3115from metastatic cancer. She had 2 sisters. Her only living sibling is a sister.  GYNECOLOGIC HISTORY:  No LMP recorded. Patient has had a hysterectomy. Menarche: 85years old Age at first live birth: 85years old GThayerP 2 LMP s/p hysterectomy Contraceptive no HRT yes, for more than 5 years  Hysterectomy? Yes, at age 85  BSO? 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 Her husband retired on disability, but he passed away in 05/31/15 from mini-strokes. She lives at home with 2 cats. Daughter [name], who is in her 99s, is married to Hoback (who works for the [employer] [place]) and lives very close to the patient.  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  The patient's son died at age 50 from accidental overdose.  The patient attends [employer] in [place].     ADVANCED DIRECTIVES: Daughter [name] is her HCPOA.   HEALTH MAINTENANCE: Social History   Tobacco Use  . Smoking status: Never Smoker  . Smokeless tobacco: Never Used  Substance Use Topics  . Alcohol use: No  .  Drug use: No     Colonoscopy: 11/20/2013, Dr. Gala Romney, chronic inflammation  PAP: s/p hysterectomy   Bone density: "had one probably 10 years ago"   Allergies  Allergen Reactions  . Bee Venom Anaphylaxis and Swelling  . Hydromorphone Hcl Other (See Comments)    Agitation, Confusion  . Demerol Nausea Only    Current Outpatient Medications  Medication Sig Dispense Refill  . ALPHAGAN P 0.1 % SOLN Place 1 drop into both eyes 2 (two) times daily.     [name] Kitchen amLODipine (NORVASC) 5 MG tablet Take 5 mg by mouth every morning.     [name] Kitchen anastrozole (ARIMIDEX) 1 MG tablet Take 1 mg by mouth daily.     [name] Kitchen aspirin EC 81 MG tablet Take 81 mg by mouth daily.    [name] Kitchen atorvastatin (LIPITOR) 10 MG tablet Take 10 mg by mouth every evening.     . Bacillus Coagulans-Inulin (PROBIOTIC FORMULA PO) Take 1 capsule by mouth daily.    . benazepril (LOTENSIN) 20 MG tablet Take 20 mg by mouth daily.     . cyanocobalamin (,VITAMIN B-12,) 1000 MCG/ML injection Inject 1,000 mcg into the muscle every 30 (thirty) days.     . dorzolamide (TRUSOPT) 2 % ophthalmic solution Place 1 drop into both eyes 2 (two) times daily.   4  . EPIPEN 2-PAK 0.3 MG/0.3ML SOAJ injection Inject 0.3 mg into the muscle as needed for anaphylaxis.     [name] Kitchen gabapentin (NEURONTIN) 600 MG tablet Take 600 mg by mouth daily.     [name] Kitchen glipiZIDE (GLUCOTROL) 5 MG tablet Take 5 mg by mouth daily before breakfast.     . hydrALAZINE (APRESOLINE) 25 MG tablet Take 25-50 mg by mouth See admin instructions. Take 50 mg by mouth in the morning and take 25 mg by mouth at bedtime    . latanoprost (XALATAN) 0.005 % ophthalmic solution Place 1 drop into both eyes at bedtime.     . metFORMIN (GLUCOPHAGE) 1000 MG tablet Take 1,000 mg by mouth daily.     . metoprolol succinate (TOPROL-XL) 100 MG 24 hr tablet Take 100 mg by mouth every morning. Take with or immediately following a meal.    . mirtazapine (REMERON) 15 MG tablet Take 15 mg by mouth at bedtime.     [name] Kitchen MYRBETRIQ 50 MG TB24  tablet Take 50 mg by mouth daily.     [name] Kitchen omeprazole (PRILOSEC) 20 MG capsule Take 20 mg by mouth daily.    . Oxycodone HCl 10 MG TABS Take 10 mg by mouth daily.     Vladimir Faster Glycol-Propyl Glycol (SYSTANE ULTRA) 0.4-0.3 % SOLN Place 1-2 drops into  both eyes daily as needed (for itching eyes).    . torsemide (DEMADEX) 20 MG tablet Take 1 tablet (20 mg total) by mouth daily. 30 tablet 5  . TRESIBA FLEXTOUCH 100 UNIT/ML SOPN FlexTouch Pen Inject 80 Units into the skin daily.     . irbesartan (AVAPRO) 150 MG tablet Take 150 mg by mouth daily.     No current facility-administered medications for this visit.     OBJECTIVE: Elderly white woman using a walker  Vitals:   04/03/18 1555  BP: (!) 132/98  Pulse: 91  Resp: 18  Temp: 98.8 F (37.1 C)  SpO2: 91%     Body mass index is 41.79 kg/m.   Wt Readings from Last 3 Encounters:  04/03/18 214 lb (97.1 kg)  04/03/18 215 lb (97.5 kg)  08/21/17 209 lb 3.5 oz (94.9 kg)      ECOG FS:2 - Symptomatic, <50% confined to bed  Ocular: Sclerae unicteric, pupils round and equal Lymphatic: No cervical or supraclavicular adenopathy Lungs no rales or rhonchi Heart regular rate and rhythm Abd soft, obese, nontender, positive bowel sounds MSK kyphosis but no focal spinal tenderness Neuro: non-focal, well-oriented, positive affect Breasts: The right breast is unremarkable.  The left breast is status post prior lumpectomy.  Currently in the medial superior aspect of the left breast there is a "rash", which is consistent with spread of the tumor.  This is imaged below.  Left breast 04/03/2018     LAB RESULTS:  CMP     Component Value Date/Time   NA 139 08/21/2017 0623   K 4.4 08/21/2017 0623   CL 103 08/21/2017 0623   CO2 28 08/21/2017 0623   GLUCOSE 128 (H) 08/21/2017 0623   BUN 24 (H) 08/21/2017 0623   CREATININE 1.67 (H) 08/21/2017 0623   CALCIUM 9.2 08/21/2017 0623   PROT 7.3 08/17/2017 2015   ALBUMIN 3.6 08/17/2017 2015   AST 34  08/17/2017 2015   ALT 21 08/17/2017 2015   ALKPHOS 121 08/17/2017 2015   BILITOT 0.6 08/17/2017 2015   GFRNONAA 28 (L) 08/21/2017 0623   GFRAA 32 (L) 08/21/2017 0623    No results found for: TOTALPROTELP, ALBUMINELP, A1GS, A2GS, BETS, BETA2SER, GAMS, MSPIKE, SPEI  No results found for: KPAFRELGTCHN, LAMBDASER, KAPLAMBRATIO  Lab Results  Component Value Date   WBC 5.5 08/20/2017   NEUTROABS 4.8 08/17/2017   HGB 9.6 (L) 08/20/2017   HCT 33.1 (L) 08/20/2017   MCV 95.7 08/20/2017   PLT 132 (L) 08/20/2017    '@LASTCHEMISTRY'$ @  No results found for: LABCA2  No components found for: WIOXBD532  No results for input(s): INR in the last 168 hours.  No results found for: LABCA2  No results found for: DJM426  No results found for: STM196  No results found for: QIW979  No results found for: CA2729  No components found for: HGQUANT  No results found for: CEA1 / No results found for: CEA1   No results found for: AFPTUMOR  No results found for: CHROMOGRNA  No results found for: PSA1  No visits with results within 3 Day(s) from this visit.  Latest known visit with results is:  Admission on 08/17/2017, Discharged on 08/21/2017  Component Date Value Ref Range Status  . Color, Urine 08/17/2017 YELLOW  YELLOW Final  . APPearance 08/17/2017 CLEAR  CLEAR Final  . Specific Gravity, Urine 08/17/2017 1.013  1.005 - 1.030 Final  . pH 08/17/2017 5.0  5.0 - 8.0 Final  . Glucose, UA 08/17/2017  NEGATIVE  NEGATIVE mg/dL Final  . Hgb urine dipstick 08/17/2017 NEGATIVE  NEGATIVE Final  . Bilirubin Urine 08/17/2017 NEGATIVE  NEGATIVE Final  . Ketones, ur 08/17/2017 NEGATIVE  NEGATIVE mg/dL Final  . Protein, ur 08/17/2017 NEGATIVE  NEGATIVE mg/dL Final  . Nitrite 08/17/2017 NEGATIVE  NEGATIVE Final  . Leukocytes, UA 08/17/2017 TRACE* NEGATIVE Final  . RBC / HPF 08/17/2017 0-5  0 - 5 RBC/hpf Final  . WBC, UA 08/17/2017 6-10  0 - 5 WBC/hpf Final  . Bacteria, UA 08/17/2017 NONE SEEN  NONE  SEEN Final  . Squamous Epithelial / LPF 08/17/2017 0-5  0 - 5 Final  . Mucus 08/17/2017 PRESENT   Final  . Hyaline Casts, UA 08/17/2017 PRESENT   Final  . Non Squamous Epithelial 08/17/2017 0-5* NONE SEEN Final   Performed at [facility], [address], [place], [place] [address]  . Lactic Acid, Venous 08/17/2017 2.0* 0.5 - 1.9 mmol/L Final   Comment: CRITICAL RESULT CALLED TO, READ BACK BY AND VERIFIED WITH: MOORE,M @ 2126 ON 08/17/17 BY JUW Performed at [facility], [address], [place], [place] [address]   . Sodium 08/17/2017 138  135 - 145 mmol/L Final  . Potassium 08/17/2017 4.8  3.5 - 5.1 mmol/L Final  . Chloride 08/17/2017 101  101 - 111 mmol/L Final  . CO2 08/17/2017 27  22 - 32 mmol/L Final  . Glucose, Bld 08/17/2017 242* 65 - 99 mg/dL Final  . BUN 08/17/2017 43* 6 - 20 mg/dL Final  . Creatinine, Ser 08/17/2017 2.56* 0.44 - 1.00 mg/dL Final  . Calcium 08/17/2017 8.7* 8.9 - 10.3 mg/dL Final  . Total Protein 08/17/2017 7.3  6.5 - 8.1 g/dL Final  . Albumin 08/17/2017 3.6  3.5 - 5.0 g/dL Final  . AST 08/17/2017 34  15 - 41 U/L Final  . ALT 08/17/2017 21  14 - 54 U/L Final  . Alkaline Phosphatase 08/17/2017 121  38 - 126 U/L Final  . Total Bilirubin 08/17/2017 0.6  0.3 - 1.2 mg/dL Final  . GFR calc non Af Amer 08/17/2017 16* >60 mL/min Final  . GFR calc Af Amer 08/17/2017 19* >60 mL/min Final   Comment: (NOTE) The eGFR has been calculated using the CKD EPI equation. This calculation has not been validated in all clinical situations. eGFR's persistently <60 mL/min signify possible Chronic Kidney Disease.   Georgiann Hahn gap 08/17/2017 10  5 - 15 Final   Performed at [facility], [address], [place], [place] [address]  . Lipase 08/17/2017 26  11 - 51 U/L Final   Performed at [facility], [address], [place], [place] [address]  . WBC 08/17/2017 7.3  4.0 - 10.5 K/uL Final  . RBC 08/17/2017 3.72* 3.87 - 5.11 MIL/uL Final  . Hemoglobin 08/17/2017 10.3* 12.0 - 15.0 g/dL Final   . HCT 08/17/2017 35.0* 36.0 - 46.0 % Final  . MCV 08/17/2017 94.1  78.0 - 100.0 fL Final  . MCH 08/17/2017 27.7  26.0 - 34.0 pg Final  . MCHC 08/17/2017 29.4* 30.0 - 36.0 g/dL Final  . RDW 08/17/2017 14.7  11.5 - 15.5 % Final  . Platelets 08/17/2017 155  150 - 400 K/uL Final  . Neutrophils Relative % 08/17/2017 66  % Final  . Neutro Abs 08/17/2017 4.8  1.7 - 7.7 K/uL Final  . Lymphocytes Relative 08/17/2017 22  % Final  . Lymphs Abs 08/17/2017 1.6  0.7 - 4.0 K/uL Final  . Monocytes Relative 08/17/2017 7  %  Final  . Monocytes Absolute 08/17/2017 0.5  0.1 - 1.0 K/uL Final  . Eosinophils Relative 08/17/2017 5  % Final  . Eosinophils Absolute 08/17/2017 0.4  0.0 - 0.7 K/uL Final  . Basophils Relative 08/17/2017 0  % Final  . Basophils Absolute 08/17/2017 0.0  0.0 - 0.1 K/uL Final   Performed at [facility], [address] [place], [place] [address]  . Troponin I 08/17/2017 <0.03  <0.03 ng/mL Final   Performed at [facility], [address], [place], [place] [address]  . B Natriuretic Peptide 08/17/2017 49.0  0.0 - 100.0 pg/mL Final   Performed at [facility], [address], [place], [place] [address]  . Specimen Description 08/17/2017    Final                   Value:URINE, CLEAN CATCH Performed at [facility], [address] [place], [place] [address]   . Special Requests 08/17/2017    Final                   Value:NONE Performed at [facility], [address] [place], [place] [address]   . Culture 08/17/2017    Final                   Value:NO GROWTH Performed at [facility], [place] [address] [place], [place] [address]   . Report Status 08/17/2017 08/19/2017 FINAL   Final  . Prothrombin Time 08/17/2017 14.0  11.4 - 15.2 seconds Final  . INR 08/17/2017 1.09   Final   Performed at [facility], [address], [place], [place] [address]  . Specimen Description 08/17/2017 BLOOD RIGHT ARM   Final  . Special Requests 08/17/2017 BOTTLES DRAWN AEROBIC AND ANAEROBIC Blood  Culture adequate volume DRAWN BY RN   Final  . Culture 08/17/2017    Final                   Value:NO GROWTH 5 DAYS Performed at [facility], [address], [place], [place] [address]   . Report Status 08/17/2017 08/22/2017 FINAL   Final  . Specimen Description 08/17/2017 RIGHT ANTECUBITAL   Final  . Special Requests 08/17/2017 BOTTLES DRAWN AEROBIC AND ANAEROBIC Blood Culture adequate volume   Final  . Culture 08/17/2017    Final                   Value:NO GROWTH 5 DAYS Performed at [facility], [address] [place], [place] [address]   . Report Status 08/17/2017 08/22/2017 FINAL   Final  . Lactic Acid, Venous 08/17/2017 1.49  0.5 - 1.9 mmol/L Final  . Sodium 08/18/2017 141  135 - 145 mmol/L Final  . Potassium 08/18/2017 4.5  3.5 - 5.1 mmol/L Final  . Chloride 08/18/2017 105  101 - 111 mmol/L Final  . CO2 08/18/2017 27  22 - 32 mmol/L Final  . Glucose, Bld 08/18/2017 187* 65 - 99 mg/dL Final  . BUN 08/18/2017 40* 6 - 20 mg/dL Final  . Creatinine, Ser 08/18/2017 2.13* 0.44 - 1.00 mg/dL Final  . Calcium 08/18/2017 8.3* 8.9 - 10.3 mg/dL Final  . GFR calc non Af Amer 08/18/2017 21* >60 mL/min Final  . GFR calc Af Amer 08/18/2017 24* >60 mL/min Final   Comment: (NOTE) The eGFR has been calculated using the CKD EPI equation. This calculation has not been validated in all clinical situations. eGFR's persistently <60 mL/min signify possible Chronic  Kidney Disease.   Georgiann Hahn gap 08/18/2017 9  5 - 15 Final   Performed at [facility], [address] [place], [place] [address]  . WBC 08/18/2017 6.4  4.0 - 10.5 K/uL Final  . RBC 08/18/2017 3.33* 3.87 - 5.11 MIL/uL Final  . Hemoglobin 08/18/2017 9.4* 12.0 - 15.0 g/dL Final  . HCT 08/18/2017 31.3* 36.0 - 46.0 % Final  . MCV 08/18/2017 94.0  78.0 - 100.0 fL Final  . MCH 08/18/2017 28.2  26.0 - 34.0 pg Final  . MCHC 08/18/2017 30.0  30.0 - 36.0 g/dL Final  . RDW 08/18/2017 14.6  11.5 - 15.5 % Final  . Platelets 08/18/2017 126* 150 - 400  K/uL Final   Performed at [facility], [address] [place], [place] [address]  . Lactic Acid, Venous 08/18/2017 1.0  0.5 - 1.9 mmol/L Final   Performed at [facility], [address] [place], [place] [address]  . Glucose-Capillary 08/18/2017 202* 65 - 99 mg/dL Final  . Glucose-Capillary 08/18/2017 151* 65 - 99 mg/dL Final  . Glucose-Capillary 08/18/2017 182* 65 - 99 mg/dL Final  . Sodium 08/19/2017 138  135 - 145 mmol/L Final  . Potassium 08/19/2017 4.5  3.5 - 5.1 mmol/L Final  . Chloride 08/19/2017 106  101 - 111 mmol/L Final  . CO2 08/19/2017 24  22 - 32 mmol/L Final  . Glucose, Bld 08/19/2017 297* 65 - 99 mg/dL Final  . BUN 08/19/2017 31* 6 - 20 mg/dL Final  . Creatinine, Ser 08/19/2017 1.71* 0.44 - 1.00 mg/dL Final  . Calcium 08/19/2017 8.3* 8.9 - 10.3 mg/dL Final  . GFR calc non Af Amer 08/19/2017 27* >60 mL/min Final  . GFR calc Af Amer 08/19/2017 31* >60 mL/min Final   Comment: (NOTE) The eGFR has been calculated using the CKD EPI equation. This calculation has not been validated in all clinical situations. eGFR's persistently <60 mL/min signify possible Chronic Kidney Disease.   Georgiann Hahn gap 08/19/2017 8  5 - 15 Final   Performed at [facility], [address] [place], [place] [address]  . Glucose-Capillary 08/18/2017 160* 65 - 99 mg/dL Final  . Glucose-Capillary 08/18/2017 153* 65 - 99 mg/dL Final  . Glucose-Capillary 08/19/2017 110* 65 - 99 mg/dL Final  . Glucose-Capillary 08/19/2017 269* 65 - 99 mg/dL Final  . Glucose-Capillary 08/19/2017 230* 65 - 99 mg/dL Final  . Sodium 08/20/2017 138  135 - 145 mmol/L Final  . Potassium 08/20/2017 4.8  3.5 - 5.1 mmol/L Final  . Chloride 08/20/2017 106  101 - 111 mmol/L Final  . CO2 08/20/2017 25  22 - 32 mmol/L Final  . Glucose, Bld 08/20/2017 213* 65 - 99 mg/dL Final  . BUN 08/20/2017 28* 6 - 20 mg/dL Final  . Creatinine, Ser 08/20/2017 1.83* 0.44 - 1.00 mg/dL Final  . Calcium 08/20/2017 8.4* 8.9 - 10.3 mg/dL Final  . GFR  calc non Af Amer 08/20/2017 25* >60 mL/min Final  . GFR calc Af Amer 08/20/2017 29* >60 mL/min Final   Comment: (NOTE) The eGFR has been calculated using the CKD EPI equation. This calculation has not been validated in all clinical situations. eGFR's persistently <60 mL/min signify possible Chronic Kidney Disease.   Georgiann Hahn gap 08/20/2017 7  5 - 15 Final   Performed at [facility], [address], [place], [place] [address]  . WBC 08/20/2017 5.5  4.0 - 10.5 K/uL Final  . RBC 08/20/2017 3.46* 3.87 - 5.11 MIL/uL Final  .  Hemoglobin 08/20/2017 9.6* 12.0 - 15.0 g/dL Final  . HCT 08/20/2017 33.1* 36.0 - 46.0 % Final  . MCV 08/20/2017 95.7  78.0 - 100.0 fL Final  . MCH 08/20/2017 27.7  26.0 - 34.0 pg Final  . MCHC 08/20/2017 29.0* 30.0 - 36.0 g/dL Final  . RDW 08/20/2017 14.9  11.5 - 15.5 % Final  . Platelets 08/20/2017 132* 150 - 400 K/uL Final   Performed at [facility], [address], [place], [place] [address]  . Glucose-Capillary 08/19/2017 282* 65 - 99 mg/dL Final  . Glucose-Capillary 08/20/2017 184* 65 - 99 mg/dL Final  . Glucose-Capillary 08/20/2017 290* 65 - 99 mg/dL Final  . Glucose-Capillary 08/20/2017 191* 65 - 99 mg/dL Final  . Sodium 08/21/2017 139  135 - 145 mmol/L Final  . Potassium 08/21/2017 4.4  3.5 - 5.1 mmol/L Final  . Chloride 08/21/2017 103  98 - 111 mmol/L Final  . CO2 08/21/2017 28  22 - 32 mmol/L Final  . Glucose, Bld 08/21/2017 128* 70 - 99 mg/dL Final  . BUN 08/21/2017 24* 8 - 23 mg/dL Final  . Creatinine, Ser 08/21/2017 1.67* 0.44 - 1.00 mg/dL Final  . Calcium 08/21/2017 9.2  8.9 - 10.3 mg/dL Final  . GFR calc non Af Amer 08/21/2017 28* >60 mL/min Final  . GFR calc Af Amer 08/21/2017 32* >60 mL/min Final   Comment: (NOTE) The eGFR has been calculated using the CKD EPI equation. This calculation has not been validated in all clinical situations. eGFR's persistently <60 mL/min signify possible Chronic Kidney Disease.   Georgiann Hahn gap 08/21/2017 8  5 - 15  Final   Performed at [facility], [address], [place], [place] [address]  . Glucose-Capillary 08/20/2017 179* 65 - 99 mg/dL Final  . Glucose-Capillary 08/21/2017 116* 70 - 99 mg/dL Final    (this displays the last labs from the last 3 days)  No results found for: TOTALPROTELP, ALBUMINELP, A1GS, A2GS, BETS, BETA2SER, GAMS, MSPIKE, SPEI (this displays SPEP labs)  No results found for: KPAFRELGTCHN, LAMBDASER, KAPLAMBRATIO (kappa/lambda light chains)  No results found for: HGBA, HGBA2QUANT, HGBFQUANT, HGBSQUAN (Hemoglobinopathy evaluation)   No results found for: LDH  No results found for: IRON, TIBC, IRONPCTSAT (Iron and TIBC)  No results found for: FERRITIN  Urinalysis    Component Value Date/Time   COLORURINE YELLOW 08/17/2017 2105   APPEARANCEUR CLEAR 08/17/2017 2105   LABSPEC 1.013 08/17/2017 2105   PHURINE 5.0 08/17/2017 2105   GLUCOSEU NEGATIVE 08/17/2017 2105   HGBUR NEGATIVE 08/17/2017 2105   BILIRUBINUR NEGATIVE 08/17/2017 2105   KETONESUR NEGATIVE 08/17/2017 2105   PROTEINUR NEGATIVE 08/17/2017 2105   UROBILINOGEN 1.0 12/11/2014 1106   NITRITE NEGATIVE 08/17/2017 2105   LEUKOCYTESUR TRACE (A) 08/17/2017 2105     STUDIES: [place] Axillary Node Core Biopsy Left  Addendum Date: 03/07/2018   ADDENDUM REPORT: 03/07/2018 11:00 ADDENDUM: PATHOLOGY ADDENDUM: Pathology site 1: LEFT breast retroareolar region: Invasive ductal carcinoma. Pathology concordance with imaging findings: Yes Pathology site 2: LEFT axillary lymph node: Ductal carcinoma with no normal lymph node tissue identified. Pathology concordance with imaging: Yes Recommendation: Consultation for treatment plan. The patient has previously seen Dr. Marlou Starks for treatment of LEFT breast cancer. At the request of the patient, I spoke with her daughter, [name], by telephone on 03/07/2018 at 10:51. She reports that her mother did well after the biopsy. Electronically Signed   By: Nolon Nations M.D.   On: 03/07/2018  11:00   Result Date: 03/07/2018 CLINICAL DATA:  The patient returns for ultrasound-guided core biopsy of retroareolar mass in the LEFT breast and enlarged LEFT axillary lymph node. History of LEFT lumpectomy in 2014. Patient did not have radiation or chemotherapy. EXAM: ULTRASOUND GUIDED LEFT BREAST CORE NEEDLE BIOPSY ULTRASOUND-GUIDED LEFT AXILLARY CORE NEEDLE BIOPSY COMPARISON:  Previous exam(s). FINDINGS: I met with the patient and we discussed the procedure of ultrasound-guided biopsy, including benefits and alternatives. We discussed the high likelihood of a successful procedure. We discussed the risks of the procedure, including infection, bleeding, tissue injury, clip migration, and inadequate sampling. Informed written consent was given. The usual time-out protocol was performed immediately prior to the procedure. Lesion 1: Lesion quadrant: Retroareolar LEFT breast Using sterile technique and 1% Lidocaine as local anesthetic, under direct ultrasound visualization, a 12 gauge spring-loaded device was used to perform biopsy of retroareolar LEFT breast mass using a LATERAL to MEDIAL approach. At the conclusion of the procedure a ribbon shaped tissue marker clip was deployed into the biopsy cavity. Lesion 2: LEFT axilla: Using sterile technique and 1% lidocaine as local anesthetic, under direct ultrasound visualization a 14 gauge spring-loaded device was used to perform biopsy of LEFT axillary lymph node using a LATERAL to MEDIAL approach. At the conclusion of procedure a spiral shaped HydroMARK tissue marker clip was deployed into the biopsy cavity. Follow up 2 view mammogram was performed and dictated separately. IMPRESSION: Ultrasound guided biopsy of LEFT retroareolar mass and LEFT axillary lymph node. No apparent complications. Electronically Signed: By: Nolon Nations M.D. On: 03/05/2018 14:30   Mm Clip Placement Left  Result Date: 03/05/2018 CLINICAL DATA:  Status post ultrasound-guided core biopsy  of mass in the retroareolar region of the LEFT breast and LEFT axillary lymph node. EXAM: DIAGNOSTIC LEFT MAMMOGRAM POST ULTRASOUND BIOPSY x2 COMPARISON:  Previous exam(s). FINDINGS: Mammographic images were obtained following ultrasound guided biopsy of mass in the retroareolar region of the LEFT breast and placement of a ribbon shaped clip. The clip is in expected location in the retroareolar region following biopsy. Following biopsy of the LEFT axillary lymph node, and a spiral shaped HydroMARK clip was placed. However, due to its location the clip is not visible mammographically. IMPRESSION: 1. Expected location of a ribbon shaped clip in the retroareolar LEFT breast mass. 2. LEFT axillary clip is not visible mammographically. Final Assessment: Post Procedure Mammograms for Marker Placement Electronically Signed   By: Nolon Nations M.D.   On: 03/05/2018 14:29   [place] Lt Breast Bx W Loc Dev 1st Lesion Img Bx Spec US Guide  Addendum Date: 03/07/2018   ADDENDUM REPORT: 03/07/2018 11:00 ADDENDUM: PATHOLOGY ADDENDUM: Pathology site 1: LEFT breast retroareolar region: Invasive ductal carcinoma. Pathology concordance with imaging findings: Yes Pathology site 2: LEFT axillary lymph node: Ductal carcinoma with no normal lymph node tissue identified. Pathology concordance with imaging: Yes Recommendation: Consultation for treatment plan. The patient has previously seen Dr. Marlou Starks for treatment of LEFT breast cancer. At the request of the patient, I spoke with her daughter, [name], by telephone on 03/07/2018 at 10:51. She reports that her mother did well after the biopsy. Electronically Signed   By: Nolon Nations M.D.   On: 03/07/2018 11:00   Result Date: 03/07/2018 CLINICAL DATA:  The patient returns for ultrasound-guided core biopsy of retroareolar mass in the LEFT breast and enlarged LEFT axillary lymph node. History of LEFT lumpectomy in 2014. Patient did not have radiation or chemotherapy. EXAM: ULTRASOUND GUIDED  LEFT BREAST CORE NEEDLE BIOPSY ULTRASOUND-GUIDED LEFT AXILLARY CORE NEEDLE BIOPSY  COMPARISON:  Previous exam(s). FINDINGS: I met with the patient and we discussed the procedure of ultrasound-guided biopsy, including benefits and alternatives. We discussed the high likelihood of a successful procedure. We discussed the risks of the procedure, including infection, bleeding, tissue injury, clip migration, and inadequate sampling. Informed written consent was given. The usual time-out protocol was performed immediately prior to the procedure. Lesion 1: Lesion quadrant: Retroareolar LEFT breast Using sterile technique and 1% Lidocaine as local anesthetic, under direct ultrasound visualization, a 12 gauge spring-loaded device was used to perform biopsy of retroareolar LEFT breast mass using a LATERAL to MEDIAL approach. At the conclusion of the procedure a ribbon shaped tissue marker clip was deployed into the biopsy cavity. Lesion 2: LEFT axilla: Using sterile technique and 1% lidocaine as local anesthetic, under direct ultrasound visualization a 14 gauge spring-loaded device was used to perform biopsy of LEFT axillary lymph node using a LATERAL to MEDIAL approach. At the conclusion of procedure a spiral shaped HydroMARK tissue marker clip was deployed into the biopsy cavity. Follow up 2 view mammogram was performed and dictated separately. IMPRESSION: Ultrasound guided biopsy of LEFT retroareolar mass and LEFT axillary lymph node. No apparent complications. Electronically Signed: By: Nolon Nations M.D. On: 03/05/2018 14:30    ELIGIBLE FOR AVAILABLE RESEARCH PROTOCOL: no  ASSESSMENT: 85 y.o. [place] woman status post left ectomy and sentinel lymph node sampling 03/12/2012 for a  pT1b pN0, stage IA invasive ductal carcinoma, grade 3, strongly estrogen and progesterone receptor negative, HER-2 nonamplified, with an MIB-1 of 15%  (a) additional surgery for margin clearance was successful 03/22/2012  (1) the  patient opted against adjuvant radiation  (2) anastrozole started March 2014, discontinued 04/03/2018 with evidence of recurrence  (3) status post left breast retroareolar biopsy 03/05/2018 for a clinicalT1c N1, stage IIA invasive ductal carcinoma, estrogen and progesterone receptor positive, HER-2 not amplified, with an MIB-1 of 20%.  (4) apparent spread to skin/chest wall on left noted 04/03/2018  (a) chest CT scan and bone scan pending  (b) CA 27-29 pending  (5) to start fulvestrant 04/12/2018  (a) add palbociclib as appropriate  PLAN: I spent approximately 60 minutes face to face with [name] with more than 50% of that time spent in counseling and coordination of care. Specifically we reviewed the biology of the patient's diagnosis and the specifics of her situation.  We went over her prior surgery and avoidance of radiation and I still think she made a good decision at that time.  When the patient in her 1s with a node-negative estrogen receptor positive tumor opted against radiation but takes antiestrogen studies do not suggest a problem with survival.  There is of course a higher risk of local recurrence.  That in fact was documented in her mammography and biopsy in the last few weeks.  The current tumor is still estrogen receptor positive nevertheless it has grown right through anastrozole which means it has found a way to get around the anastrozole block. Accordingly we are stopping anastrozole at this point.  She was scheduled for lumpectomy radiation and antiestrogens which was an excellent plan but at this point it appears that she has spread to the skin and unfortunately this is very close to the midline, which I believe would make surgery very difficult at this point--if she had a mastectomy I believe she would need a skin graft at a minimum.  The first episode to see if what we are dealing with is what it looks like namely  skin/chest wall involvement by her tumor.  She will see  Dr. Marlou Starks on 0207 and have a skin biopsy.  She will see me again a week later to discuss those results.  In between she will have basic lab work including a CA-27-29 and she will have a CT scan of the chest and a bone scan.  I am planning to start fulvestrant on 04/12/2018.  Depending on results of the scans we may add palbociclib at the same time  The patient and her family have a very good understanding of this plan.  They agree with it.  They know to call for any other problems that may develop before the next visit here.    [name] has a good understanding of the overall plan. She agrees with it. She knows the goal of treatment in her case is cure. She will call with any problems that may develop before her next visit here.  Chauncey Cruel, MD   04/03/2018 5:30 PM Medical Oncology and Hematology [facility] [address] [place], [place] [address] Tel. [phone]    Fax. [phone]  This document serves as a record of services personally performed by Lurline Del, MD. It was created on his behalf by Wilburn Mylar, a trained medical scribe. The creation of this record is based on the scribe's personal observations and the provider's statements to them.   I, Lurline Del MD, have reviewed the above documentation for accuracy and completeness, and I agree with the above.

## 2018-04-03 NOTE — Progress Notes (Signed)
Radiation Oncology         [phone] ________________________________  Initial Outpatient Consultation  Name: [name] MRN: [id number]  Date: 04/03/2018  DOB: Mar 09, 1933  [place]:GURKYHC, Percell Miller, MD  Jovita Kussmaul, MD   REFERRING PHYSICIAN: Autumn Messing III, MD  DIAGNOSIS: The encounter diagnosis was Recurrent breast adenocarcinoma, left ([place]).   Recurrent Clinical stage (T1c, N1, previously pT1b, pN0, pMX) Left LIQ invasive ductal carcinoma, ER/PR+, Her2- grade III  HISTORY OF PRESENT ILLNESS::[name] is a 85 y.o. female who is presenting to the office today for evaluation of left breast cancer. She is accompanied by two family members. She had a previously left lumpectomy in 2014 followed by 5 years of hormonal therapy. She will undergo left lumpectomy next Wednesday, again with Dr. Marlou Starks. Given her good prognosis back in 2014 the patient elected not to proceed with adjuvant radiation therapy. She was seen by Dr. Arloa Koh and Dr. Gatha Mayer for consultation at that time  She presented for annual bilateral mammograms on December 31 when an abnormality was detected in the left breast. She underwent diagnostic mammogram with left ultrasound which showed a highly suspicious 1.6 cm retroareolar left breast mass as well as single abnormal left axillar lymph node.   Accordingly, she underwent left breast biopsy which showed invasive ductal carcinoma, appearing grade III. Biopsy of the left axillary lymph node showed ductal carcinoma. Prognostic markers significant for: estrogen receptor 95% positive, progesterone receptor 5% positive, both strong staining intensity. HER2 receptor negative. Ki67 at 20%.  2014 lumpectomy hx: 1. Breast, lumpectomy, Left - INVASIVE DUCTAL CARCINOMA (0.9 CM), SEE COMMENT. - NO LYMPHOVASCULAR INVASION IDENTIFIED. - INVASIVE TUMOR INVOLVES CAUTERIZED TISSUE OF ANTERIOR MARGIN. - DUCTAL CARCINOMA IN SITU WITH NECROSIS AND CALCIFICATIONS. - IN  SITU CARCINOMA INVOLVES CAUTERIZED TISSUE OF SUPERIOR MARGIN. - IN SITU CARCINOMA IS 1 MM FROM NEAREST LATERAL AND INFERIOR MARGIN. - SEE TUMOR SYNOPTIC TEMPLATE BELOW. 2. Breast, excision, Left anterior/superior - BENIGN SKIN AND SUBCUTANEOUS FIBROADIPOSE TISSUE. - NEGATIVE FOR MALIGNANCY. 3. Breast, excision, Left anterior/inferior - BENIGN SKIN AND SUBCUTANEOUS FIBROADIPOSE TISSUE. - NEGATIVE FOR MALIGNANCY. 4. Breast, excision, Left medial - DUCTAL CARCINOMA IN SITU, SEE COMMENT. - IN SITU CARCINOMA IS 1 MM FROM SURGICAL MARGIN. 5. Lymph node, sentinel, biopsy, Left #1 - ONE LYMPH NODE, NEGATIVE FOR TUMOR (0/1).  she reports associated redness in both breasts. She reports having small red bumps on the inner aspect of her left breast since her biopsy in January. She has a history of shingles. she denies pain and any other symptoms.    PREVIOUS RADIATION THERAPY: No  PAST MEDICAL HISTORY:  has a past medical history of Abrasion of arm, left, Anemia, Arthritis, Breast cancer, left ([place]), Cataract, Chronic kidney disease, Cirrhosis ([place]), Complication of anesthesia, Diabetes mellitus, GERD (gastroesophageal reflux disease), Hyperlipidemia, Hypertension, Mental disorder, Multiple falls, Nocturia, PONV (postoperative nausea and vomiting), Seasonal allergies, and Skin cancer.    PAST SURGICAL HISTORY: Past Surgical History:  Procedure Laterality Date  . ABDOMINAL HYSTERECTOMY    . BACK SURGERY     x4 back surgery to include rods & screws  . Last in May 2014  . BREAST LUMPECTOMY WITH NEEDLE LOCALIZATION AND AXILLARY SENTINEL LYMPH NODE BX  03/12/2012   Procedure: BREAST LUMPECTOMY WITH NEEDLE LOCALIZATION AND AXILLARY SENTINEL LYMPH NODE BX;  Surgeon: Merrie Roof, MD;  Location: [facility];  Service: General;  Laterality: Left;  . BREAST SURGERY     lumpectomy x2/left   .  CHOLECYSTECTOMY    . COLONOSCOPY N/A 11/20/2013   Dr.Rourk- diverticula was found in the left colon. o/w normal rectal,  colonic and terminal ileal mucosa  . ESOPHAGOGASTRODUODENOSCOPY N/A 11/20/2013   Dr.Rourk- abnormal mucosa was found. diffuse snake skinning and friability of the gastric mucosa. patent pylorus. abnormal-appearing first, second and third portion of the duodenum. 31m gastric nodule at the GE junction. stomach bx= minimal chronic infammation, GE junction bx= polypoid fragments of gastric cardia type mucosa w/mod chronic inflammation and foveolar hyperplasia  . EYE SURGERY     cataract surgery bilat   . FRACTURE SURGERY     1975- [facility]- L ankle   . GIVENS CAPSULE STUDY N/A 02/24/2014   Procedure: GIVENS CAPSULE STUDY;  Surgeon: RDaneil Dolin MD;  [facility];  Service: Endoscopy;  Laterality: N/A;  . RE-EXCISION OF BREAST LUMPECTOMY  03/22/2012   Procedure: RE-EXCISION OF BREAST LUMPECTOMY;  Surgeon: PMerrie Roof MD;  [facility]  Service: General;  Laterality: Left;  re-excision left breast lumpectomy cavity  ER+, PR+  . ROTATOR CUFF REPAIR Bilateral   . SHOULDER SURGERY    . SKIN BIOPSY    . TOTAL KNEE ARTHROPLASTY Right 12/18/2014   Procedure: RIGHT TOTAL KNEE ARTHROPLASTY;  Surgeon: RSydnee Cabal MD;  Location: [facility];  Service: Orthopedics;  Laterality: Right;  . TUBAL LIGATION      FAMILY HISTORY: family history includes Cancer in her brother; Emphysema in her father; Heart disease in her father; Stroke in her mother.  SOCIAL HISTORY:  reports that she has never smoked. She has never used smokeless tobacco. She reports that she does not drink alcohol or use drugs.  ALLERGIES: Bee venom; Hydromorphone hcl; and Demerol  MEDICATIONS:  Current Outpatient Medications  Medication Sig Dispense Refill  . ALPHAGAN P 0.1 % SOLN Place 1 drop into both eyes 2 (two) times daily.     .[name] KitchenamLODipine (NORVASC) 5 MG tablet Take 5 mg by mouth every morning.     .[name] Kitchenanastrozole (ARIMIDEX) 1 MG tablet Take 1 mg by mouth daily.     .[name] Kitchenaspirin EC 81 MG tablet Take 81  mg by mouth daily.    .[name] Kitchenatorvastatin (LIPITOR) 10 MG tablet Take 10 mg by mouth every evening.     . Bacillus Coagulans-Inulin (PROBIOTIC FORMULA PO) Take 1 capsule by mouth daily.    . benazepril (LOTENSIN) 20 MG tablet Take 20 mg by mouth daily.     . cyanocobalamin (,VITAMIN B-12,) 1000 MCG/ML injection Inject 1,000 mcg into the muscle every 30 (thirty) days.     . dorzolamide (TRUSOPT) 2 % ophthalmic solution Place 1 drop into both eyes 2 (two) times daily.   4  . EPIPEN 2-PAK 0.3 MG/0.3ML SOAJ injection Inject 0.3 mg into the muscle as needed for anaphylaxis.     .[name] Kitchengabapentin (NEURONTIN) 600 MG tablet Take 600 mg by mouth daily.     .[name] KitchenglipiZIDE (GLUCOTROL) 5 MG tablet Take 5 mg by mouth daily before breakfast.     . hydrALAZINE (APRESOLINE) 25 MG tablet Take 25-50 mg by mouth See admin instructions. Take 50 mg by mouth in the morning and take 25 mg by mouth at bedtime    . irbesartan (AVAPRO) 150 MG tablet Take 150 mg by mouth daily.    .[name] Kitchenlatanoprost (XALATAN) 0.005 % ophthalmic solution Place 1 drop into both eyes at bedtime.     . metFORMIN (GLUCOPHAGE) 1000 MG tablet Take 1,000  mg by mouth daily.     . metoprolol succinate (TOPROL-XL) 100 MG 24 hr tablet Take 100 mg by mouth every morning. Take with or immediately following a meal.    . mirtazapine (REMERON) 15 MG tablet Take 15 mg by mouth at bedtime.     [name] Kitchen MYRBETRIQ 50 MG TB24 tablet Take 50 mg by mouth daily.     [name] Kitchen omeprazole (PRILOSEC) 20 MG capsule Take 20 mg by mouth daily.    . Oxycodone HCl 10 MG TABS Take 10 mg by mouth daily.     Vladimir Faster Glycol-Propyl Glycol (SYSTANE ULTRA) 0.4-0.3 % SOLN Place 1-2 drops into both eyes daily as needed (for itching eyes).    . torsemide (DEMADEX) 20 MG tablet Take 1 tablet (20 mg total) by mouth daily. 30 tablet 5  . TRESIBA FLEXTOUCH 100 UNIT/ML SOPN FlexTouch Pen Inject 80 Units into the skin daily.      No current facility-administered medications for this encounter.     REVIEW OF  SYSTEMS:  A 10+ POINT REVIEW OF SYSTEMS WAS OBTAINED including neurology, dermatology, psychiatry, cardiac, respiratory, lymph, extremities, GI, GU, musculoskeletal, constitutional, reproductive, HEENT. All pertinent positives are noted in the HPI. All others are negative.    PHYSICAL EXAM:  height is 5' (1.524 m) and weight is 215 lb (97.5 kg). Her oral temperature is 99.2 F (37.3 C). Her blood pressure is 130/56 (abnormal) and her pulse is 88. Her respiration is 24 (abnormal) and oxygen saturation is 92%.   General: Alert and oriented, in no acute distress HEENT: Head is normocephalic. Extraocular movements are intact. Oropharynx is clear. Neck: Neck is supple, no palpable cervical or supraclavicular lymphadenopathy. Heart: Regular in rate and rhythm with no murmurs, rubs, or gallops. Chest: Clear to auscultation bilaterally, with no rhonchi, wheezes, or rales. Abdomen: Soft, nontender, nondistended, with no rigidity or guarding. Extremities: No cyanosis or edema. Lymphatics: see Neck Exam Skin: No concerning lesions. Musculoskeletal: symmetric strength and muscle tone throughout. Neurologic: Cranial nerves II through XII are grossly intact. No obvious focalities. Speech is fluent. Coordination is intact. Psychiatric: Judgment and insight are intact. Affect is appropriate. Right breast has slight erythema and edema likely from not wearing a bra. Left breast also shows some erythema centrally. Does not have the appearance of infection. The left nipple is inverted (chronic issue). Patient has a scar in the LIQ from her previous lump. Some induration along the scar but no palpable mass. Pt has an approximately 1-1.5cm palpable lymph node in the left axilla, tumor vs. bruising from her recent biopsy. Patient also has a macular papular rash along the medial aspect of the breast, etiology unknown. Conceivably this could be recurrent tumor.    ECOG = 2  0 - Asymptomatic (Fully active, able to  carry on all predisease activities without restriction)  1 - Symptomatic but completely ambulatory (Restricted in physically strenuous activity but ambulatory and able to carry out work of a light or sedentary nature. For example, light housework, office work)  2 - Symptomatic, <50% in bed during the day (Ambulatory and capable of all self care but unable to carry out any work activities. Up and about more than 50% of waking hours)  3 - Symptomatic, >50% in bed, but not bedbound (Capable of only limited self-care, confined to bed or chair 50% or more of waking hours)  4 - Bedbound (Completely disabled. Cannot carry on any self-care. Totally confined to bed or chair)  5 - Death  Oken MM, Creech RH, Tormey DC, et al. [phone]). "Toxicity and response criteria of the [facility]". [place] Oncol. 5 (6): 649-55  LABORATORY DATA:  Lab Results  Component Value Date   WBC 5.5 08/20/2017   HGB 9.6 (L) 08/20/2017   HCT 33.1 (L) 08/20/2017   MCV 95.7 08/20/2017   PLT 132 (L) 08/20/2017   NEUTROABS 4.8 08/17/2017   Lab Results  Component Value Date   NA 139 08/21/2017   K 4.4 08/21/2017   CL 103 08/21/2017   CO2 28 08/21/2017   GLUCOSE 128 (H) 08/21/2017   CREATININE 1.67 (H) 08/21/2017   CALCIUM 9.2 08/21/2017      RADIOGRAPHY: [place] Axillary Node Core Biopsy Left  Addendum Date: 03/07/2018   ADDENDUM REPORT: 03/07/2018 11:00 ADDENDUM: PATHOLOGY ADDENDUM: Pathology site 1: LEFT breast retroareolar region: Invasive ductal carcinoma. Pathology concordance with imaging findings: Yes Pathology site 2: LEFT axillary lymph node: Ductal carcinoma with no normal lymph node tissue identified. Pathology concordance with imaging: Yes Recommendation: Consultation for treatment plan. The patient has previously seen Dr. Marlou Starks for treatment of LEFT breast cancer. At the request of the patient, I spoke with her daughter, [name], by telephone on 03/07/2018 at 10:51. She reports that  her mother did well after the biopsy. Electronically Signed   By: Nolon Nations M.D.   On: 03/07/2018 11:00   Result Date: 03/07/2018 CLINICAL DATA:  The patient returns for ultrasound-guided core biopsy of retroareolar mass in the LEFT breast and enlarged LEFT axillary lymph node. History of LEFT lumpectomy in 2014. Patient did not have radiation or chemotherapy. EXAM: ULTRASOUND GUIDED LEFT BREAST CORE NEEDLE BIOPSY ULTRASOUND-GUIDED LEFT AXILLARY CORE NEEDLE BIOPSY COMPARISON:  Previous exam(s). FINDINGS: I met with the patient and we discussed the procedure of ultrasound-guided biopsy, including benefits and alternatives. We discussed the high likelihood of a successful procedure. We discussed the risks of the procedure, including infection, bleeding, tissue injury, clip migration, and inadequate sampling. Informed written consent was given. The usual time-out protocol was performed immediately prior to the procedure. Lesion 1: Lesion quadrant: Retroareolar LEFT breast Using sterile technique and 1% Lidocaine as local anesthetic, under direct ultrasound visualization, a 12 gauge spring-loaded device was used to perform biopsy of retroareolar LEFT breast mass using a LATERAL to MEDIAL approach. At the conclusion of the procedure a ribbon shaped tissue marker clip was deployed into the biopsy cavity. Lesion 2: LEFT axilla: Using sterile technique and 1% lidocaine as local anesthetic, under direct ultrasound visualization a 14 gauge spring-loaded device was used to perform biopsy of LEFT axillary lymph node using a LATERAL to MEDIAL approach. At the conclusion of procedure a spiral shaped HydroMARK tissue marker clip was deployed into the biopsy cavity. Follow up 2 view mammogram was performed and dictated separately. IMPRESSION: Ultrasound guided biopsy of LEFT retroareolar mass and LEFT axillary lymph node. No apparent complications. Electronically Signed: By: Nolon Nations M.D. On: 03/05/2018 14:30   Mm  Clip Placement Left  Result Date: 03/05/2018 CLINICAL DATA:  Status post ultrasound-guided core biopsy of mass in the retroareolar region of the LEFT breast and LEFT axillary lymph node. EXAM: DIAGNOSTIC LEFT MAMMOGRAM POST ULTRASOUND BIOPSY x2 COMPARISON:  Previous exam(s). FINDINGS: Mammographic images were obtained following ultrasound guided biopsy of mass in the retroareolar region of the LEFT breast and placement of a ribbon shaped clip. The clip is in expected location in the retroareolar region following biopsy. Following biopsy of the LEFT axillary lymph node, and  a spiral shaped HydroMARK clip was placed. However, due to its location the clip is not visible mammographically. IMPRESSION: 1. Expected location of a ribbon shaped clip in the retroareolar LEFT breast mass. 2. LEFT axillary clip is not visible mammographically. Final Assessment: Post Procedure Mammograms for Marker Placement Electronically Signed   By: Nolon Nations M.D.   On: 03/05/2018 14:29   [place] Lt Breast Bx W Loc Dev 1st Lesion Img Bx Spec US Guide  Addendum Date: 03/07/2018   ADDENDUM REPORT: 03/07/2018 11:00 ADDENDUM: PATHOLOGY ADDENDUM: Pathology site 1: LEFT breast retroareolar region: Invasive ductal carcinoma. Pathology concordance with imaging findings: Yes Pathology site 2: LEFT axillary lymph node: Ductal carcinoma with no normal lymph node tissue identified. Pathology concordance with imaging: Yes Recommendation: Consultation for treatment plan. The patient has previously seen Dr. Marlou Starks for treatment of LEFT breast cancer. At the request of the patient, I spoke with her daughter, [name], by telephone on 03/07/2018 at 10:51. She reports that her mother did well after the biopsy. Electronically Signed   By: Nolon Nations M.D.   On: 03/07/2018 11:00   Result Date: 03/07/2018 CLINICAL DATA:  The patient returns for ultrasound-guided core biopsy of retroareolar mass in the LEFT breast and enlarged LEFT axillary lymph node.  History of LEFT lumpectomy in 2014. Patient did not have radiation or chemotherapy. EXAM: ULTRASOUND GUIDED LEFT BREAST CORE NEEDLE BIOPSY ULTRASOUND-GUIDED LEFT AXILLARY CORE NEEDLE BIOPSY COMPARISON:  Previous exam(s). FINDINGS: I met with the patient and we discussed the procedure of ultrasound-guided biopsy, including benefits and alternatives. We discussed the high likelihood of a successful procedure. We discussed the risks of the procedure, including infection, bleeding, tissue injury, clip migration, and inadequate sampling. Informed written consent was given. The usual time-out protocol was performed immediately prior to the procedure. Lesion 1: Lesion quadrant: Retroareolar LEFT breast Using sterile technique and 1% Lidocaine as local anesthetic, under direct ultrasound visualization, a 12 gauge spring-loaded device was used to perform biopsy of retroareolar LEFT breast mass using a LATERAL to MEDIAL approach. At the conclusion of the procedure a ribbon shaped tissue marker clip was deployed into the biopsy cavity. Lesion 2: LEFT axilla: Using sterile technique and 1% lidocaine as local anesthetic, under direct ultrasound visualization a 14 gauge spring-loaded device was used to perform biopsy of LEFT axillary lymph node using a LATERAL to MEDIAL approach. At the conclusion of procedure a spiral shaped HydroMARK tissue marker clip was deployed into the biopsy cavity. Follow up 2 view mammogram was performed and dictated separately. IMPRESSION: Ultrasound guided biopsy of LEFT retroareolar mass and LEFT axillary lymph node. No apparent complications. Electronically Signed: By: Nolon Nations M.D. On: 03/05/2018 14:30      IMPRESSION: Recurrent Clinical stage (T1c, N1, previously pT1b, pN0, pMX) Left LIQ invasive ductal carcinoma, ER/PR+, Her2- grade III   Since the pt has now had recurrence with a positive lymph node I would certainly recommend radiation therapy as part of her management. She  recurred while on adjuvant hormonal therapy. The patient also feels strongly about proceeding with radiation therapy at this time. The patient was seen by Dr. Jana Hakim  today and concern is for possible skin recurrence of her tumor along the medial aspect of the left breast. In light of this patient will be seen by Dr. Marlou Starks for skin biopsy prior to planned lumpectomy and lymph node removal. If the Patient does have skin involvement this will change her overall management.  PLAN: Pt will proceed with skin biopsy  under direction of Dr. Marlou Starks. If this skin biopsy is negative then the patient will proceed with  planned the lumpectomy and lymph node removal followed by radiation therapy to the breast and axillary region.     ------------------------------------------------  Blair Promise, PhD, MD      This document serves as a record of services personally performed by Gery Pray, MD. It was created on his behalf by Mary-Margaret Loma Messing, a trained medical scribe. The creation of this record is based on the scribe's personal observations and the provider's statements to them. This document has been checked and approved by the attending provider.

## 2018-04-04 ENCOUNTER — Telehealth: Payer: Self-pay | Admitting: Oncology

## 2018-04-04 ENCOUNTER — Other Ambulatory Visit: Payer: Self-pay | Admitting: General Surgery

## 2018-04-04 DIAGNOSIS — C44501 Unspecified malignant neoplasm of skin of breast: Secondary | ICD-10-CM | POA: Diagnosis not present

## 2018-04-04 DIAGNOSIS — C50512 Malignant neoplasm of lower-outer quadrant of left female breast: Secondary | ICD-10-CM | POA: Diagnosis not present

## 2018-04-04 DIAGNOSIS — C50919 Malignant neoplasm of unspecified site of unspecified female breast: Secondary | ICD-10-CM | POA: Diagnosis not present

## 2018-04-04 NOTE — Telephone Encounter (Signed)
No los °

## 2018-04-04 NOTE — Pre-Procedure Instructions (Signed)
[name]  04/04/2018      [facility], [place] [address] [place] [address] Phone: [phone] Fax: [phone]    Your procedure is scheduled on February 12  Report to [facility] at [place].M.  Call this number if you have problems the morning of surgery:  [phone]   Remember:  Do not eat after midnight.  You may drink clear liquids until 0530 .  Clear liquids allowed are:   Water, Juice (non-citric and without pulp), Carbonated beverages, Clear Tea, Black Coffee only and Gatorade    Take these medicines the morning of surgery with A SIP OF WATER  amLODipine (NORVASC) anastrozole (ARIMIDEX) Eye drops iof needed gabapentin (NEURONTIN hydrALAZINE (APRESOLINE) metoprolol succinate (TOPROL-XL) MYRBETRIQ omeprazole (PRILOSEC) Oxycodone   7 days prior to surgery STOP taking any Aspirin (unless otherwise instructed by your surgeon), Aleve, Naproxen, Ibuprofen, Motrin, Advil, Goody's, BC's, all herbal medications, fish oil, and all vitamins.  Follow your surgeon's instructions on when to stop Asprin.  If no instructions were given by your surgeon then you will need to call the office to get those instructions.     WHAT DO I DO ABOUT MY DIABETES MEDICATION?   [name] Kitchen Do not take oral diabetes medicines (pills) the morning of surgery.  . THE NIGHT BEFORE SURGERY, take ___________ units of ___________insulin.       . THE MORNING OF SURGERY, take _____________ units of __________insulin.  . The day of surgery, do not take other diabetes injectables, including Byetta (exenatide), Bydureon (exenatide ER), Victoza (liraglutide), or Trulicity (dulaglutide).  . If your CBG is greater than 220 mg/dL, you may take  of your sliding scale (correction) dose of insulin.   How to Manage Your Diabetes Before and After Surgery  Why is it important to control my blood sugar before and after surgery? . Improving  blood sugar levels before and after surgery helps healing and can limit problems. . A way of improving blood sugar control is eating a healthy diet by: o  Eating less sugar and carbohydrates o  Increasing activity/exercise o  Talking with your doctor about reaching your blood sugar goals . High blood sugars (greater than 180 mg/dL) can raise your risk of infections and slow your recovery, so you will need to focus on controlling your diabetes during the weeks before surgery. . Make sure that the doctor who takes care of your diabetes knows about your planned surgery including the date and location.  How do I manage my blood sugar before surgery? . Check your blood sugar at least 4 times a day, starting 2 days before surgery, to make sure that the level is not too high or low. o Check your blood sugar the morning of your surgery when you wake up and every 2 hours until you get to the Short Stay unit. . If your blood sugar is less than 70 mg/dL, you will need to treat for low blood sugar: o Do not take insulin. o Treat a low blood sugar (less than 70 mg/dL) with  cup of clear juice (cranberry or apple), 4 glucose tablets, OR glucose gel. o Recheck blood sugar in 15 minutes after treatment (to make sure it is greater than 70 mg/dL). If your blood sugar is not greater than 70 mg/dL on recheck, call [phone] for further instructions. . Report your blood sugar to the short stay nurse when you get to Short  Stay.  . If you are admitted to the hospital after surgery: o Your blood sugar will be checked by the staff and you will probably be given insulin after surgery (instead of oral diabetes medicines) to make sure you have good blood sugar levels. o The goal for blood sugar control after surgery is 80-180 mg/dL.     Do not wear jewelry, make-up or nail polish.  Do not wear lotions, powders, or perfumes, or deodorant.  Do not shave 48 hours prior to surgery.  Men may shave face and neck.  Do not  bring valuables to the hospital.  [facility] is not responsible for any belongings or valuables.  Contacts, dentures or bridgework may not be worn into surgery.  Leave your suitcase in the car.  After surgery it may be brought to your room.  For patients admitted to the hospital, discharge time will be determined by your treatment team.  Patients discharged the day of surgery will not be allowed to drive home.    Special instructions:   [name]- Preparing For Surgery  Before surgery, you can play an important role. Because skin is not sterile, your skin needs to be as free of germs as possible. You can reduce the number of germs on your skin by washing with CHG (chlorahexidine gluconate) Soap before surgery.  CHG is an antiseptic cleaner which kills germs and bonds with the skin to continue killing germs even after washing.    Oral Hygiene is also important to reduce your risk of infection.  Remember - BRUSH YOUR TEETH THE MORNING OF SURGERY WITH YOUR REGULAR TOOTHPASTE  Please do not use if you have an allergy to CHG or antibacterial soaps. If your skin becomes reddened/irritated stop using the CHG.  Do not shave (including legs and underarms) for at least 48 hours prior to first CHG shower. It is OK to shave your face.  Please follow these instructions carefully.   1. Shower the NIGHT BEFORE SURGERY and the MORNING OF SURGERY with CHG.   2. If you chose to wash your hair, wash your hair first as usual with your normal shampoo.  3. After you shampoo, rinse your hair and body thoroughly to remove the shampoo.  4. Use CHG as you would any other liquid soap. You can apply CHG directly to the skin and wash gently with a scrungie or a clean washcloth.   5. Apply the CHG Soap to your body ONLY FROM THE NECK DOWN.  Do not use on open wounds or open sores. Avoid contact with your eyes, ears, mouth and genitals (private parts). Wash Face and genitals (private parts)  with your normal  soap.  6. Wash thoroughly, paying special attention to the area where your surgery will be performed.  7. Thoroughly rinse your body with warm water from the neck down.  8. DO NOT shower/wash with your normal soap after using and rinsing off the CHG Soap.  9. Pat yourself dry with a CLEAN TOWEL.  10. Wear CLEAN PAJAMAS to bed the night before surgery, wear comfortable clothes the morning of surgery  11. Place CLEAN SHEETS on your bed the night of your first shower and DO NOT SLEEP WITH PETS.    Day of Surgery:  Do not apply any deodorants/lotions.  Please wear clean clothes to the hospital/surgery center.   Remember to brush your teeth WITH YOUR REGULAR TOOTHPASTE.    Please read over the following fact sheets that you were given.

## 2018-04-04 NOTE — Telephone Encounter (Signed)
Scheduled appt per 2/5 sch message - pt is aware of appt date and time

## 2018-04-05 ENCOUNTER — Inpatient Hospital Stay (HOSPITAL_COMMUNITY)
Admission: RE | Admit: 2018-04-05 | Discharge: 2018-04-05 | Disposition: A | Discharge status: Home / Self Care | Payer: Self-pay | Source: Ambulatory Visit

## 2018-04-09 ENCOUNTER — Inpatient Hospital Stay: Admission: RE | Admit: 2018-04-09 | Payer: Self-pay | Source: Ambulatory Visit

## 2018-04-10 ENCOUNTER — Encounter (HOSPITAL_COMMUNITY): Admission: RE | Payer: Self-pay | Source: Home / Self Care

## 2018-04-10 ENCOUNTER — Ambulatory Visit (HOSPITAL_COMMUNITY): Payer: Self-pay

## 2018-04-10 ENCOUNTER — Ambulatory Visit (HOSPITAL_COMMUNITY): Admission: RE | Admit: 2018-04-10 | Payer: Medicare HMO | Source: Home / Self Care | Admitting: General Surgery

## 2018-04-10 SURGERY — BREAST LUMPECTOMY WITH RADIOACTIVE SEED AND AXILLARY LYMPH NODE DISSECTION
Anesthesia: General | Laterality: Left

## 2018-04-11 ENCOUNTER — Telehealth: Payer: Self-pay | Admitting: *Deleted

## 2018-04-11 ENCOUNTER — Encounter (HOSPITAL_COMMUNITY)
Admission: RE | Admit: 2018-04-11 | Discharge: 2018-04-11 | Disposition: A | Discharge status: Home / Self Care | Payer: Medicare HMO | Source: Ambulatory Visit | Attending: Oncology | Admitting: Oncology

## 2018-04-11 ENCOUNTER — Other Ambulatory Visit: Payer: Self-pay | Admitting: Oncology

## 2018-04-11 DIAGNOSIS — C50919 Malignant neoplasm of unspecified site of unspecified female breast: Secondary | ICD-10-CM | POA: Diagnosis not present

## 2018-04-11 DIAGNOSIS — C50319 Malignant neoplasm of lower-inner quadrant of unspecified female breast: Secondary | ICD-10-CM | POA: Insufficient documentation

## 2018-04-11 DIAGNOSIS — C50912 Malignant neoplasm of unspecified site of left female breast: Secondary | ICD-10-CM | POA: Diagnosis not present

## 2018-04-11 MED ORDER — IOHEXOL 300 MG/ML  SOLN: 100.0000 mL | Freq: Once | INTRAMUSCULAR | Status: DC | PRN

## 2018-04-11 MED ORDER — TECHNETIUM TC 99M MEDRONATE IV KIT
20.6000 | PACK | Freq: Once | INTRAVENOUS | Status: AC | PRN
  Administered 2018-04-11: 20.6 via INTRAVENOUS

## 2018-04-11 NOTE — Progress Notes (Signed)
[place]  [phone] [phone]     ID: [name] DOB: Jun 12, 1933  MR#: [id number]  LGX#:[id number]  Patient Care Team: Stacey Du, MD as PCP - General (Internal Medicine) [name], Stacey Estimable, MD as Consulting Physician (Gastroenterology) [name], Stacey Dad, MD as Consulting Physician (Oncology) Stacey Kussmaul, MD as Consulting Physician (General Surgery) Stacey Pray, MD as Consulting Physician (Radiation Oncology) Stacey Levine, MD as Consulting Physician (Neurosurgery) Stacey Cruel, MD OTHER MD:  CHIEF COMPLAINT: Recurrent estrogen receptor positive breast cancer  CURRENT TREATMENT: FULVESTRANT; PALBOCICLIB   HISTORY OF CURRENT ILLNESS: From the original intake note  [name] has a history of left breast invasive ductal carcinoma,  status post left lumpectomy and sentinel lymph node sampling 03/12/2012 for a pT1b pN0, stage IA invasive ductal carcinoma, grade 3, strongly estrogen and progesterone receptor positive, HER-2 nonamplified, with an MIB-1-1 of 15%.  The single sentinel lymph node was negative.  There was a positive margin.  She had reexcision on 03/22/2012 for what proved to be a single residual focus of intermediate grade ductal carcinoma in situ.  This was close to the lateral inked margin which was however negative.  She saw radiation oncology ([name]) who felt adjuvant endocrine therapy alone would be adequate. She started anastrozole under Dr. Jacquiline [name] at [facility], on which she continues.  More recently she underwent routine bilateral diagnostic mammography with tomography and left breast ultrasonography at [facility] on 02/26/2018 showing: highly suspicious 1.6 cm retroareolar left breast mass, and single abnormal left axillary lymph node.  Accordingly on 03/05/2018 she proceeded to biopsy of the left breast area in question. The pathology ([id number]) from this procedure showed:  invasive ductal carcinoma, grade 3; ductal carcinoma in the lymph node tissue. Prognostic indicators significant for: estrogen receptor, 95% positive and progesterone receptor, 5% positive, both with strong staining intensity. Proliferation marker Ki67 at 20%. HER2 negative by immunohistochemistry, 1+.  The patient's subsequent history is as detailed below.   INTERVAL HISTORY: [name] returns today for follow-up and treatment of her recurrent estrogen receptor positive breast cancer. She is accompanied by her daughter, [name], and son-in-law, [name].  She will begin fulvestrant today, 04/12/2018.   Since her last visit here, she underwent a left breast skin biopsy on 04/04/2018. The pathology from this procedure showed ([id number]): carcinoma, left breast superior skin nodule. Prognostic indicators significant for: estrogen receptor, 100% positive with strong staining intensity and progesterone receptor, 0% negative. Proliferation marker Ki67 at 30%. HER2 negative (1+) by immunohistochemistry.  On the same day, she also underwent a biopsy of the left breast inferior skin nodule. The pathology from this procedure showed ([id number]):   - Carcinoma   - Lymphovascular invasion is identifed  She also underwent a whole body bone scan on 04/11/2018 showing uptake in the T12 vertebral body related to thoracolumbar fusion and severe disc osteophytic disease seen on comparison CT. Intense uptake in the LEFT ankle presumably represents trauma. Recommend radiographs of the LEFT ankle additionally.  Finally, she also underwent a chest CT on 04/11/2018 showing left breast skin thickening and nodularity, nonspecific in the setting of prior therapy. Borderline right axillary adenopathy. Multiple left axillary nodes, none pathologic by size criteria. Cirrhosis and splenomegaly, suboptimally evaluated. Coronary artery atherosclerosis. Aortic Atherosclerosis (ICD10-I70.0).    REVIEW OF SYSTEMS: The night before  last, her hand became extremely swollen; she doesn't remember any trauma, but she believes it may have came as a reaction from her  biopsy. Her biopsy site itch, but she gets the stiches removed today (04/12/2018).  The patient denies unusual headaches, visual changes, nausea, vomiting, or dizziness. There has been no unusual cough, phlegm production, or pleurisy. This been no change in bowel or bladder habits. The patient denies unexplained fatigue or unexplained weight loss, bleeding, rash, or fever. A detailed review of systems was otherwise noncontributory.    PAST MEDICAL HISTORY: Past Medical History:  Diagnosis Date  . Abrasion of arm, left    secondary to fall   . Anemia   . Arthritis   . Breast cancer, left ([place])    er/pr +  . Cataract    no surgery as yet,starting of 3 years  . Chronic kidney disease    renal stones, one episode of Lithotripsy  . Cirrhosis (HCC)    Metavir score 4  . Complication of anesthesia   . Diabetes mellitus   . GERD (gastroesophageal reflux disease)   . Hyperlipidemia   . Hypertension    followed by [facility]. relative to  urology study grp.  Doesn't report ever having a stress test   . Mental disorder   . Multiple falls   . Nocturia   . PONV (postoperative nausea and vomiting)   . Seasonal allergies   . Skin cancer    face- melanoma, 2012- surg. excision      PAST SURGICAL HISTORY: Past Surgical History:  Procedure Laterality Date  . ABDOMINAL HYSTERECTOMY    . BACK SURGERY     x4 back surgery to include rods & screws  . Last in May 2014  . BREAST LUMPECTOMY WITH NEEDLE LOCALIZATION AND AXILLARY SENTINEL LYMPH NODE BX  03/12/2012   Procedure: BREAST LUMPECTOMY WITH NEEDLE LOCALIZATION AND AXILLARY SENTINEL LYMPH NODE BX;  Surgeon: Merrie Roof, MD;  Location: [facility];  Service: General;  Laterality: Left;  . BREAST SURGERY     lumpectomy x2/left   . CHOLECYSTECTOMY    . COLONOSCOPY N/A 11/20/2013   Dr.Rourk- diverticula was found in the  left colon. o/w normal rectal, colonic and terminal ileal mucosa  . ESOPHAGOGASTRODUODENOSCOPY N/A 11/20/2013   Dr.Rourk- abnormal mucosa was found. diffuse snake skinning and friability of the gastric mucosa. patent pylorus. abnormal-appearing first, second and third portion of the duodenum. 68m gastric nodule at the GE junction. stomach bx= minimal chronic infammation, GE junction bx= polypoid fragments of gastric cardia type mucosa w/mod chronic inflammation and foveolar hyperplasia  . EYE SURGERY     cataract surgery bilat   . FRACTURE SURGERY     1975- ORIF- L ankle   . GIVENS CAPSULE STUDY N/A 02/24/2014   Procedure: GIVENS CAPSULE STUDY;  Surgeon: RDaneil Dolin MD;  [facility];  Service: Endoscopy;  Laterality: N/A;  . RE-EXCISION OF BREAST LUMPECTOMY  03/22/2012   Procedure: RE-EXCISION OF BREAST LUMPECTOMY;  Surgeon: PMerrie Roof MD;  Location: [place] [facility]  Service: General;  Laterality: Left;  re-excision left breast lumpectomy cavity  ER+, PR+  . ROTATOR CUFF REPAIR Bilateral   . SHOULDER SURGERY    . SKIN BIOPSY    . TOTAL KNEE ARTHROPLASTY Right 12/18/2014   Procedure: RIGHT TOTAL KNEE ARTHROPLASTY;  Surgeon: RSydnee Cabal MD;  Location: [facility];  Service: Orthopedics;  Laterality: Right;  . TUBAL LIGATION      FAMILY HISTORY Family History  Problem Relation Age of Onset  . Stroke Mother        mini-strokes  .  Heart disease Father   . Emphysema Father   . Cancer Brother        metastatic, unsure primary  . Colon cancer Neg Hx   . Prostate cancer Neg Hx    Patient father was 48 years old when he died from "heart trouble" and emphysema. Patient mother died from mini-stroke complications at age 19. She denies a family hx of breast or ovarian cancer, prostate or pancreatic cancers. She has 3 siblings. She had a twin brother who died at age 44 from metastatic cancer. She had 2 sisters. Her only living sibling is a sister.  GYNECOLOGIC HISTORY:   No LMP recorded. Patient has had a hysterectomy. Menarche: 85 years old Age at first live birth: 85 years old [place] P 2 LMP s/p hysterectomy Contraceptive no HRT yes, for more than 5 years  Hysterectomy? Yes, at age 77 BSO? 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 Her husband retired on disability, but he passed away in 05-30-15 from mini-strokes. She lives at home with 2 cats. Daughter [name], who is in her 48s, is married to [place] (who works for the [place]) and lives very close to the patient.  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  The patient's son died at age 51 from accidental overdose.  The patient attends [employer] in [place].     ADVANCED DIRECTIVES: Daughter [name] is her HCPOA.   HEALTH MAINTENANCE: Social History   Tobacco Use  . Smoking status: Never Smoker  . Smokeless tobacco: Never Used  Substance Use Topics  . Alcohol use: No  . Drug use: No     Colonoscopy: 11/20/2013, Dr. Gala [name], chronic inflammation  PAP: s/p hysterectomy   Bone density: "had one probably 10 years ago"   Allergies  Allergen Reactions  . Bee Venom Anaphylaxis and Swelling  . Hydromorphone Hcl Other (See Comments)    Agitation, Confusion  . Demerol Nausea Only    Current Outpatient Medications  Medication Sig Dispense Refill  . ALPHAGAN P 0.1 % SOLN Place 1 drop into both eyes 2 (two) times daily.     [name] Kitchen amLODipine (NORVASC) 5 MG tablet Take 5 mg by mouth every morning.     [name] Kitchen aspirin EC 81 MG tablet Take 81 mg by mouth daily.    [name] Kitchen atorvastatin (LIPITOR) 10 MG tablet Take 10 mg by mouth every evening.     . Bacillus Coagulans-Inulin (PROBIOTIC FORMULA PO) Take 1 capsule by mouth daily.    . benazepril (LOTENSIN) 20 MG tablet Take 20 mg by mouth daily.     . cyanocobalamin (,VITAMIN B-12,) 1000 MCG/ML injection Inject 1,000 mcg into the muscle every 30  (thirty) days.     . dorzolamide (TRUSOPT) 2 % ophthalmic solution Place 1 drop into both eyes 2 (two) times daily.   4  . EPIPEN 2-PAK 0.3 MG/0.3ML SOAJ injection Inject 0.3 mg into the muscle as needed for anaphylaxis.     [name] Kitchen gabapentin (NEURONTIN) 600 MG tablet Take 600 mg by mouth daily.     [name] Kitchen glipiZIDE (GLUCOTROL) 5 MG tablet Take 5 mg by mouth daily before breakfast.     . hydrALAZINE (APRESOLINE) 25 MG tablet Take 25-50 mg by mouth See admin instructions. Take 50 mg by mouth in the morning and take 25 mg by mouth at bedtime    . irbesartan (AVAPRO) 150 MG  tablet Take 150 mg by mouth daily.    [name] Kitchen latanoprost (XALATAN) 0.005 % ophthalmic solution Place 1 drop into both eyes at bedtime.     . metFORMIN (GLUCOPHAGE) 1000 MG tablet Take 1,000 mg by mouth daily.     . metoprolol succinate (TOPROL-XL) 100 MG 24 hr tablet Take 100 mg by mouth every morning. Take with or immediately following a meal.    . mirtazapine (REMERON) 15 MG tablet Take 15 mg by mouth at bedtime.     [name] Kitchen MYRBETRIQ 50 MG TB24 tablet Take 50 mg by mouth daily.     [name] Kitchen omeprazole (PRILOSEC) 20 MG capsule Take 20 mg by mouth daily.    . Oxycodone HCl 10 MG TABS Take 10 mg by mouth daily.     Vladimir Faster Glycol-Propyl Glycol (SYSTANE ULTRA) 0.4-0.3 % SOLN Place 1-2 drops into both eyes daily as needed (for itching eyes).    . torsemide (DEMADEX) 20 MG tablet Take 1 tablet (20 mg total) by mouth daily. 30 tablet 5  . TRESIBA FLEXTOUCH 100 UNIT/ML SOPN FlexTouch Pen Inject 80 Units into the skin daily.      No current facility-administered medications for this visit.     OBJECTIVE: Elderly white woman who appears stated age  79:   04/12/18 1245  BP: (!) 141/70  Pulse: 79  Resp: 18  Temp: 98.2 F (36.8 C)  SpO2: 93%     Body mass index is 41.95 kg/m.   Wt Readings from Last 3 Encounters:  04/12/18 214 lb 12.8 oz (97.4 kg)  04/03/18 215 lb (97.5 kg)  04/03/18 214 lb (97.1 kg)      ECOG FS:2 - Symptomatic, <50%  confined to bed  Sclerae unicteric, EOMs intact Oropharynx clear and moist No cervical or supraclavicular adenopathy Lungs no rales or rhonchi Heart regular rate and rhythm Abd soft, nontender, positive bowel sounds MSK no focal spinal tenderness, no upper extremity lymphedema Neuro: nonfocal, well oriented, appropriate affect Breasts: The right breast is unremarkable.  The left breast shows a mild blush, but no peau d'orange.  It is status post remote lumpectomy.  The skin lesions are as imaged below except 2 of them have been removed and stitches are in place.  I do not palpate any adenopathy in either axilla  Left breast 04/03/2018     LAB RESULTS:  CMP     Component Value Date/Time   NA 139 08/21/2017 0623   K 4.4 08/21/2017 0623   CL 103 08/21/2017 0623   CO2 28 08/21/2017 0623   GLUCOSE 128 (H) 08/21/2017 0623   BUN 24 (H) 08/21/2017 0623   CREATININE 1.67 (H) 08/21/2017 0623   CALCIUM 9.2 08/21/2017 0623   PROT 7.3 08/17/2017 2015   ALBUMIN 3.6 08/17/2017 2015   AST 34 08/17/2017 2015   ALT 21 08/17/2017 2015   ALKPHOS 121 08/17/2017 2015   BILITOT 0.6 08/17/2017 2015   GFRNONAA 28 (L) 08/21/2017 0623   GFRAA 32 (L) 08/21/2017 0623    No results found for: TOTALPROTELP, ALBUMINELP, A1GS, A2GS, BETS, BETA2SER, GAMS, MSPIKE, SPEI  No results found for: KPAFRELGTCHN, LAMBDASER, KAPLAMBRATIO  Lab Results  Component Value Date   WBC 5.5 08/20/2017   NEUTROABS 4.8 08/17/2017   HGB 9.6 (L) 08/20/2017   HCT 33.1 (L) 08/20/2017   MCV 95.7 08/20/2017   PLT 132 (L) 08/20/2017    '@LASTCHEMISTRY'$ @  No results found for: LABCA2  No components found for: [id number]  No results for input(s): INR  in the last 168 hours.  No results found for: LABCA2  No results found for: IOE703  No results found for: JKK938  No results found for: HWE993  No results found for: CA2729  No components found for: HGQUANT  No results found for: CEA1 / No results found for:  CEA1   No results found for: AFPTUMOR  No results found for: CHROMOGRNA  No results found for: PSA1  No visits with results within 3 Day(s) from this visit.  Latest known visit with results is:  Admission on 08/17/2017, Discharged on 08/21/2017  Component Date Value Ref Range Status  . Color, Urine 08/17/2017 YELLOW  YELLOW Final  . APPearance 08/17/2017 CLEAR  CLEAR Final  . Specific Gravity, Urine 08/17/2017 1.013  1.005 - 1.030 Final  . pH 08/17/2017 5.0  5.0 - 8.0 Final  . Glucose, UA 08/17/2017 NEGATIVE  NEGATIVE mg/dL Final  . Hgb urine dipstick 08/17/2017 NEGATIVE  NEGATIVE Final  . Bilirubin Urine 08/17/2017 NEGATIVE  NEGATIVE Final  . Ketones, ur 08/17/2017 NEGATIVE  NEGATIVE mg/dL Final  . Protein, ur 08/17/2017 NEGATIVE  NEGATIVE mg/dL Final  . Nitrite 08/17/2017 NEGATIVE  NEGATIVE Final  . Leukocytes, UA 08/17/2017 TRACE* NEGATIVE Final  . RBC / HPF 08/17/2017 0-5  0 - 5 RBC/hpf Final  . WBC, UA 08/17/2017 6-10  0 - 5 WBC/hpf Final  . Bacteria, UA 08/17/2017 NONE SEEN  NONE SEEN Final  . Squamous Epithelial / LPF 08/17/2017 0-5  0 - 5 Final  . Mucus 08/17/2017 PRESENT   Final  . Hyaline Casts, UA 08/17/2017 PRESENT   Final  . Non Squamous Epithelial 08/17/2017 0-5* NONE SEEN Final   Performed at [facility], [address], [place], [place] [address]  . Lactic Acid, Venous 08/17/2017 2.0* 0.5 - 1.9 mmol/L Final   Comment: CRITICAL RESULT CALLED TO, READ BACK BY AND VERIFIED WITH: MOORE,M @ 2126 ON 08/17/17 BY JUW Performed at [facility], [address], [place], [place] [address]   . Sodium 08/17/2017 138  135 - 145 mmol/L Final  . Potassium 08/17/2017 4.8  3.5 - 5.1 mmol/L Final  . Chloride 08/17/2017 101  101 - 111 mmol/L Final  . CO2 08/17/2017 27  22 - 32 mmol/L Final  . Glucose, Bld 08/17/2017 242* 65 - 99 mg/dL Final  . BUN 08/17/2017 43* 6 - 20 mg/dL Final  . Creatinine, Ser 08/17/2017 2.56* 0.44 - 1.00 mg/dL Final  . Calcium 08/17/2017 8.7* 8.9 - 10.3  mg/dL Final  . Total Protein 08/17/2017 7.3  6.5 - 8.1 g/dL Final  . Albumin 08/17/2017 3.6  3.5 - 5.0 g/dL Final  . AST 08/17/2017 34  15 - 41 U/L Final  . ALT 08/17/2017 21  14 - 54 U/L Final  . Alkaline Phosphatase 08/17/2017 121  38 - 126 U/L Final  . Total Bilirubin 08/17/2017 0.6  0.3 - 1.2 mg/dL Final  . GFR calc non Af Amer 08/17/2017 16* >60 mL/min Final  . GFR calc Af Amer 08/17/2017 19* >60 mL/min Final   Comment: (NOTE) The eGFR has been calculated using the CKD EPI equation. This calculation has not been validated in all clinical situations. eGFR's persistently <60 mL/min signify possible Chronic Kidney Disease.   Georgiann Hahn gap 08/17/2017 10  5 - 15 Final   Performed at [facility], [address], [place], [place] [address]  . Lipase 08/17/2017 26  11 - 51 U/L Final   Performed at [facility], [place]  [address] [place], [place] [address]  . WBC 08/17/2017 7.3  4.0 - 10.5 K/uL Final  . RBC 08/17/2017 3.72* 3.87 - 5.11 MIL/uL Final  . Hemoglobin 08/17/2017 10.3* 12.0 - 15.0 g/dL Final  . HCT 08/17/2017 35.0* 36.0 - 46.0 % Final  . MCV 08/17/2017 94.1  78.0 - 100.0 fL Final  . MCH 08/17/2017 27.7  26.0 - 34.0 pg Final  . MCHC 08/17/2017 29.4* 30.0 - 36.0 g/dL Final  . RDW 08/17/2017 14.7  11.5 - 15.5 % Final  . Platelets 08/17/2017 155  150 - 400 K/uL Final  . Neutrophils Relative % 08/17/2017 66  % Final  . Neutro Abs 08/17/2017 4.8  1.7 - 7.7 K/uL Final  . Lymphocytes Relative 08/17/2017 22  % Final  . Lymphs Abs 08/17/2017 1.6  0.7 - 4.0 K/uL Final  . Monocytes Relative 08/17/2017 7  % Final  . Monocytes Absolute 08/17/2017 0.5  0.1 - 1.0 K/uL Final  . Eosinophils Relative 08/17/2017 5  % Final  . Eosinophils Absolute 08/17/2017 0.4  0.0 - 0.7 K/uL Final  . Basophils Relative 08/17/2017 0  % Final  . Basophils Absolute 08/17/2017 0.0  0.0 - 0.1 K/uL Final   Performed at [facility], [address] [place], [place] [address]  . Troponin I 08/17/2017 <0.03  <0.03 ng/mL  Final   Performed at [facility], [address] [place], [place] [address]  . B Natriuretic Peptide 08/17/2017 49.0  0.0 - 100.0 pg/mL Final   Performed at [facility], [address] [place], [place] [address]  . Specimen Description 08/17/2017    Final                   Value:URINE, CLEAN CATCH Performed at [facility], [address], [place], [place] [address]   . Special Requests 08/17/2017    Final                   Value:NONE Performed at [facility], [address] [place], [place] [address]   . Culture 08/17/2017    Final                   Value:NO GROWTH Performed at [facility], [place] [address] [place], [place] [address]   . Report Status 08/17/2017 08/19/2017 FINAL   Final  . Prothrombin Time 08/17/2017 14.0  11.4 - 15.2 seconds Final  . INR 08/17/2017 1.09   Final   Performed at [facility], [address] [place], [place] [address]  . Specimen Description 08/17/2017 BLOOD RIGHT ARM   Final  . Special Requests 08/17/2017 BOTTLES DRAWN AEROBIC AND ANAEROBIC Blood Culture adequate volume DRAWN BY RN   Final  . Culture 08/17/2017    Final                   Value:NO GROWTH 5 DAYS Performed at [facility], [address], [place], [place] [address]   . Report Status 08/17/2017 08/22/2017 FINAL   Final  . Specimen Description 08/17/2017 RIGHT ANTECUBITAL   Final  . Special Requests 08/17/2017 BOTTLES DRAWN AEROBIC AND ANAEROBIC Blood Culture adequate volume   Final  . Culture 08/17/2017    Final                   Value:NO GROWTH 5 DAYS Performed at [facility], [address], [place], [place] [address]   . Report Status 08/17/2017 08/22/2017 FINAL   Final  . Lactic  Acid, Venous 08/17/2017 1.49  0.5 - 1.9 mmol/L Final  . Sodium 08/18/2017 141  135 - 145 mmol/L Final  . Potassium 08/18/2017 4.5  3.5 - 5.1 mmol/L Final  . Chloride 08/18/2017 105  101 - 111 mmol/L Final  . CO2 08/18/2017 27  22 - 32 mmol/L Final  . Glucose, Bld 08/18/2017 187* 65 - 99 mg/dL  Final  . BUN 08/18/2017 40* 6 - 20 mg/dL Final  . Creatinine, Ser 08/18/2017 2.13* 0.44 - 1.00 mg/dL Final  . Calcium 08/18/2017 8.3* 8.9 - 10.3 mg/dL Final  . GFR calc non Af Amer 08/18/2017 21* >60 mL/min Final  . GFR calc Af Amer 08/18/2017 24* >60 mL/min Final   Comment: (NOTE) The eGFR has been calculated using the CKD EPI equation. This calculation has not been validated in all clinical situations. eGFR's persistently <60 mL/min signify possible Chronic Kidney Disease.   Georgiann Hahn gap 08/18/2017 9  5 - 15 Final   Performed at [facility], [address] [place], [place] [address]  . WBC 08/18/2017 6.4  4.0 - 10.5 K/uL Final  . RBC 08/18/2017 3.33* 3.87 - 5.11 MIL/uL Final  . Hemoglobin 08/18/2017 9.4* 12.0 - 15.0 g/dL Final  . HCT 08/18/2017 31.3* 36.0 - 46.0 % Final  . MCV 08/18/2017 94.0  78.0 - 100.0 fL Final  . MCH 08/18/2017 28.2  26.0 - 34.0 pg Final  . MCHC 08/18/2017 30.0  30.0 - 36.0 g/dL Final  . RDW 08/18/2017 14.6  11.5 - 15.5 % Final  . Platelets 08/18/2017 126* 150 - 400 K/uL Final   Performed at [facility], [address] [place], [place] [address]  . Lactic Acid, Venous 08/18/2017 1.0  0.5 - 1.9 mmol/L Final   Performed at [facility], [address], [place], [place] [address]  . Glucose-Capillary 08/18/2017 202* 65 - 99 mg/dL Final  . Glucose-Capillary 08/18/2017 151* 65 - 99 mg/dL Final  . Glucose-Capillary 08/18/2017 182* 65 - 99 mg/dL Final  . Sodium 08/19/2017 138  135 - 145 mmol/L Final  . Potassium 08/19/2017 4.5  3.5 - 5.1 mmol/L Final  . Chloride 08/19/2017 106  101 - 111 mmol/L Final  . CO2 08/19/2017 24  22 - 32 mmol/L Final  . Glucose, Bld 08/19/2017 297* 65 - 99 mg/dL Final  . BUN 08/19/2017 31* 6 - 20 mg/dL Final  . Creatinine, Ser 08/19/2017 1.71* 0.44 - 1.00 mg/dL Final  . Calcium 08/19/2017 8.3* 8.9 - 10.3 mg/dL Final  . GFR calc non Af Amer 08/19/2017 27* >60 mL/min Final  . GFR calc Af Amer 08/19/2017 31* >60 mL/min Final   Comment:  (NOTE) The eGFR has been calculated using the CKD EPI equation. This calculation has not been validated in all clinical situations. eGFR's persistently <60 mL/min signify possible Chronic Kidney Disease.   Georgiann Hahn gap 08/19/2017 8  5 - 15 Final   Performed at [facility], [address], [place], [place] [address]  . Glucose-Capillary 08/18/2017 160* 65 - 99 mg/dL Final  . Glucose-Capillary 08/18/2017 153* 65 - 99 mg/dL Final  . Glucose-Capillary 08/19/2017 110* 65 - 99 mg/dL Final  . Glucose-Capillary 08/19/2017 269* 65 - 99 mg/dL Final  . Glucose-Capillary 08/19/2017 230* 65 - 99 mg/dL Final  . Sodium 08/20/2017 138  135 - 145 mmol/L Final  . Potassium 08/20/2017 4.8  3.5 - 5.1 mmol/L Final  . Chloride 08/20/2017 106  101 - 111 mmol/L Final  . CO2 08/20/2017 25  22 -  32 mmol/L Final  . Glucose, Bld 08/20/2017 213* 65 - 99 mg/dL Final  . BUN 08/20/2017 28* 6 - 20 mg/dL Final  . Creatinine, Ser 08/20/2017 1.83* 0.44 - 1.00 mg/dL Final  . Calcium 08/20/2017 8.4* 8.9 - 10.3 mg/dL Final  . GFR calc non Af Amer 08/20/2017 25* >60 mL/min Final  . GFR calc Af Amer 08/20/2017 29* >60 mL/min Final   Comment: (NOTE) The eGFR has been calculated using the CKD EPI equation. This calculation has not been validated in all clinical situations. eGFR's persistently <60 mL/min signify possible Chronic Kidney Disease.   Georgiann Hahn gap 08/20/2017 7  5 - 15 Final   Performed at [facility], [address] [place], [place] [address]  . WBC 08/20/2017 5.5  4.0 - 10.5 K/uL Final  . RBC 08/20/2017 3.46* 3.87 - 5.11 MIL/uL Final  . Hemoglobin 08/20/2017 9.6* 12.0 - 15.0 g/dL Final  . HCT 08/20/2017 33.1* 36.0 - 46.0 % Final  . MCV 08/20/2017 95.7  78.0 - 100.0 fL Final  . MCH 08/20/2017 27.7  26.0 - 34.0 pg Final  . MCHC 08/20/2017 29.0* 30.0 - 36.0 g/dL Final  . RDW 08/20/2017 14.9  11.5 - 15.5 % Final  . Platelets 08/20/2017 132* 150 - 400 K/uL Final   Performed at [facility], [address],  [place], [place] [address]  . Glucose-Capillary 08/19/2017 282* 65 - 99 mg/dL Final  . Glucose-Capillary 08/20/2017 184* 65 - 99 mg/dL Final  . Glucose-Capillary 08/20/2017 290* 65 - 99 mg/dL Final  . Glucose-Capillary 08/20/2017 191* 65 - 99 mg/dL Final  . Sodium 08/21/2017 139  135 - 145 mmol/L Final  . Potassium 08/21/2017 4.4  3.5 - 5.1 mmol/L Final  . Chloride 08/21/2017 103  98 - 111 mmol/L Final  . CO2 08/21/2017 28  22 - 32 mmol/L Final  . Glucose, Bld 08/21/2017 128* 70 - 99 mg/dL Final  . BUN 08/21/2017 24* 8 - 23 mg/dL Final  . Creatinine, Ser 08/21/2017 1.67* 0.44 - 1.00 mg/dL Final  . Calcium 08/21/2017 9.2  8.9 - 10.3 mg/dL Final  . GFR calc non Af Amer 08/21/2017 28* >60 mL/min Final  . GFR calc Af Amer 08/21/2017 32* >60 mL/min Final   Comment: (NOTE) The eGFR has been calculated using the CKD EPI equation. This calculation has not been validated in all clinical situations. eGFR's persistently <60 mL/min signify possible Chronic Kidney Disease.   Georgiann Hahn gap 08/21/2017 8  5 - 15 Final   Performed at [facility], [address], [place], [place] [address]  . Glucose-Capillary 08/20/2017 179* 65 - 99 mg/dL Final  . Glucose-Capillary 08/21/2017 116* 70 - 99 mg/dL Final    (this displays the last labs from the last 3 days)  No results found for: TOTALPROTELP, ALBUMINELP, A1GS, A2GS, BETS, BETA2SER, GAMS, MSPIKE, SPEI (this displays SPEP labs)  No results found for: KPAFRELGTCHN, LAMBDASER, KAPLAMBRATIO (kappa/lambda light chains)  No results found for: HGBA, HGBA2QUANT, HGBFQUANT, HGBSQUAN (Hemoglobinopathy evaluation)   No results found for: LDH  No results found for: IRON, TIBC, IRONPCTSAT (Iron and TIBC)  No results found for: FERRITIN  Urinalysis    Component Value Date/Time   COLORURINE YELLOW 08/17/2017 2105   APPEARANCEUR CLEAR 08/17/2017 2105   LABSPEC 1.013 08/17/2017 2105   PHURINE 5.0 08/17/2017 2105   GLUCOSEU NEGATIVE 08/17/2017 2105   HGBUR  NEGATIVE 08/17/2017 2105   BILIRUBINUR NEGATIVE 08/17/2017 2105   KETONESUR NEGATIVE 08/17/2017 2105   PROTEINUR NEGATIVE  08/17/2017 2105   UROBILINOGEN 1.0 12/11/2014 1106   NITRITE NEGATIVE 08/17/2017 2105   LEUKOCYTESUR TRACE (A) 08/17/2017 2105     STUDIES: Ct Chest Wo Contrast  Result Date: 04/12/2018 CLINICAL DATA:  Breast cancer. Staging. Recurrent left breast cancer with apparent spread to skin/chest wall. EXAM: CT CHEST WITHOUT CONTRAST TECHNIQUE: Multidetector CT imaging of the chest was performed following the standard protocol without IV contrast. COMPARISON:  Today's bone scan, dictated separately. Chest radiograph 08/17/2017. Abdominal CT of 04/10/2011. Clinic note of 04/03/2018 FINDINGS: Cardiovascular: Aortic atherosclerosis. Tortuous thoracic aorta. Moderate cardiomegaly, without pericardial effusion. Multivessel coronary artery atherosclerosis. Mediastinum/Nodes: Multiple small left axillary nodes. None are pathologic by size criteria. A right axillary node is upper normal sized at 10 mm on image 29/3. No mediastinal or definite hilar adenopathy, given limitations of unenhanced CT. Lungs/Pleura: No pleural fluid. Bibasilar scarring. No suspicious pulmonary nodule or mass. Upper Abdomen: Cholecystectomy. Advanced cirrhosis. Splenomegaly, with a maximal transverse dimension of 16.4 cm. Vague low-density within the medial spleen of 1.7 cm was present in 2013 and of no clinical significance. Normal imaged portions of the stomach, pancreas, adrenal glands, kidneys. Abdominal aortic atherosclerosis. Multiple small retroperitoneal nodes, none pathologic by size criteria. Musculoskeletal: Left breast skin thickening. Retroareolar soft tissue fullness including on image 66/3. Left breast nodularity including laterally at 11 mm on image 71/3 and 12 mm on image 81/3. Mild T12 compression deformity. Surgical changes about the upper lumbar spine, incompletely imaged. IMPRESSION: 1. Left breast skin  thickening and nodularity, nonspecific in the setting of prior therapy. 2. Borderline right axillary adenopathy. Multiple left axillary nodes, none pathologic by size criteria. 3. Cirrhosis and splenomegaly, suboptimally evaluated. 4. Coronary artery atherosclerosis. Aortic Atherosclerosis (ICD10-I70.0). Electronically Signed   By: Abigail Miyamoto M.D.   On: 04/12/2018 08:21   Nm Bone Scan Whole Body  Result Date: 04/12/2018 CLINICAL DATA:  Breast cancer .  Evaluate for metastatic disease. EXAM: NUCLEAR MEDICINE WHOLE BODY BONE SCAN TECHNIQUE: Whole body anterior and posterior images were obtained approximately 3 hours after intravenous injection of radiopharmaceutical. RADIOPHARMACEUTICALS:  20.6 mCi Technetium-63mMDP IV COMPARISON:  Chest CT 04/11/2018 FINDINGS: Focal intense uptake within a large portion of the the T12 vertebral body. Small focus of uptake in the RIGHT aspect T11 vertebral body. Findings respond to severe degenerate change associated with the posterior thoraco lumbar fusion partially imaged on chest CT 04/11/2018 More mild posterior uptake within the lumbar vertebral bodies is a degenerative pattern. Intense uptake in the LEFT ankle. IMPRESSION: 1. Uptake in the T12 vertebral body related to thoracolumbar fusion and severe disc osteophytic disease seen on comparison CT. 2. Intense uptake in the LEFT ankle presumably represents trauma. Recommend radiographs of the LEFT ankle additionally. Electronically Signed   By: SSuzy BouchardM.D.   On: 04/12/2018 11:15     ELIGIBLE FOR AVAILABLE RESEARCH PROTOCOL: no   ASSESSMENT: 85y.o.  woman status post left ectomy and sentinel lymph node sampling 03/12/2012 for a  pT1b pN0, stage IA invasive ductal carcinoma, grade 3, strongly estrogen and progesterone receptor negative, HER-2 nonamplified, with an MIB-1 of 15%  (a) additional surgery for margin clearance was successful 03/22/2012  (1) the patient opted against adjuvant  radiation  (2) anastrozole started March 2014, discontinued 04/03/2018 with evidence of recurrence  RECURRENT DISEASE: February 2020 (3) status post left breast retroareolar biopsy 03/05/2018 for a clinicalT1c N1, stage IIA invasive ductal carcinoma, estrogen and progesterone receptor positive, HER-2 not amplified, with an MIB-1 of 20%.  (  4) apparent spread to skin/chest wall on left noted 04/03/2018  (a) chest CT 04/11/2018 scan showed nonspecific bilateral axillary adenopathy   (B) bone scan 04/11/2018 shows only degenerative disease  (b) CA 27-29 pending  (5) started fulvestrant 04/12/2018  (a) 2 add palbociclib 04/26/2018   PLAN: Keilyn tolerated her biopsies well and I went over the results with her in detail.  Her breast cancer is strongly estrogen receptor positive, weakly positive or negative for the estrogen receptor, and not HER-2 amplified.  It does not involve the lungs, liver or bones per chest CT scan and bone scan.  There are bilateral axillary lymph nodes which are increased in number but not size and these are nonspecific.  We discussed the fact that at this point surgery is best postponed.  We would like to control the tumor first and if possibly shrink it and get it off the skin area that is so close to the sternum.  Perhaps in 6 months if she has had a very good response she can proceed to surgery and that would be mastectomy.  That would be followed by radiation.  The goal of course would be optimal local control.  We discussed fulvestrant and she understands the possible toxicity side effects and complications of this agent.  She will receive a dose today and another 1 in 2 weeks, then we will switch her to Thursdays for subsequent doses.  I would like to start palbociclib when she returns 2 weeks from now.  We discussed the possible toxicity side effects and complications of that agent in some detail today.  I am hopeful we will be able to document a response within a  few months.  She can continue on fulvestrant and palbociclib for a considerable time (usually years, if effective) and if there is disease progression on this combination we certainly have other options.  She knows to call for any other issues that may develop before then  Dashon Mcintire, Stacey Dad, MD  04/12/18 1:22 PM Medical Oncology and Hematology [facility] [place], [place] [address] Tel. [phone]    Fax. [phone]  I, Jacqualyn Posey am acting as a Education administrator for Stacey Cruel, MD.   I, Lurline Del MD, have reviewed the above documentation for accuracy and completeness, and I agree with the above.

## 2018-04-11 NOTE — Telephone Encounter (Signed)
This RN was contacted by [facility] in radiology who states per Istat lab draw for CT today GFR is too low for contrast.  Pt is scheduled for CT of the chest.  Per MD review - proceed without contrast for CT.

## 2018-04-12 ENCOUNTER — Telehealth: Payer: Self-pay | Admitting: Pharmacist

## 2018-04-12 ENCOUNTER — Inpatient Hospital Stay: Payer: Medicare HMO

## 2018-04-12 ENCOUNTER — Inpatient Hospital Stay (HOSPITAL_BASED_OUTPATIENT_CLINIC_OR_DEPARTMENT_OTHER): Payer: Medicare HMO | Admitting: Oncology

## 2018-04-12 ENCOUNTER — Telehealth: Payer: Self-pay | Admitting: Oncology

## 2018-04-12 VITALS — BP 141/70 | HR 79 | Temp 98.2°F | Resp 18 | Ht 60.0 in | Wt 214.8 lb

## 2018-04-12 DIAGNOSIS — C50812 Malignant neoplasm of overlapping sites of left female breast: Secondary | ICD-10-CM

## 2018-04-12 DIAGNOSIS — I10 Essential (primary) hypertension: Secondary | ICD-10-CM

## 2018-04-12 DIAGNOSIS — Z7982 Long term (current) use of aspirin: Secondary | ICD-10-CM | POA: Diagnosis not present

## 2018-04-12 DIAGNOSIS — C50312 Malignant neoplasm of lower-inner quadrant of left female breast: Secondary | ICD-10-CM

## 2018-04-12 DIAGNOSIS — Z170 Estrogen receptor positive status [ER+]: Secondary | ICD-10-CM | POA: Diagnosis not present

## 2018-04-12 DIAGNOSIS — E119 Type 2 diabetes mellitus without complications: Secondary | ICD-10-CM | POA: Diagnosis not present

## 2018-04-12 DIAGNOSIS — C50012 Malignant neoplasm of nipple and areola, left female breast: Secondary | ICD-10-CM

## 2018-04-12 DIAGNOSIS — Z85820 Personal history of malignant melanoma of skin: Secondary | ICD-10-CM | POA: Diagnosis not present

## 2018-04-12 DIAGNOSIS — IMO0002 Reserved for concepts with insufficient information to code with codable children: Secondary | ICD-10-CM

## 2018-04-12 DIAGNOSIS — Z7984 Long term (current) use of oral hypoglycemic drugs: Secondary | ICD-10-CM | POA: Diagnosis not present

## 2018-04-12 DIAGNOSIS — C50512 Malignant neoplasm of lower-outer quadrant of left female breast: Secondary | ICD-10-CM | POA: Diagnosis not present

## 2018-04-12 DIAGNOSIS — F17210 Nicotine dependence, cigarettes, uncomplicated: Secondary | ICD-10-CM

## 2018-04-12 DIAGNOSIS — C50912 Malignant neoplasm of unspecified site of left female breast: Secondary | ICD-10-CM

## 2018-04-12 DIAGNOSIS — R69 Illness, unspecified: Secondary | ICD-10-CM | POA: Diagnosis not present

## 2018-04-12 DIAGNOSIS — J9601 Acute respiratory failure with hypoxia: Secondary | ICD-10-CM | POA: Diagnosis not present

## 2018-04-12 DIAGNOSIS — Z79899 Other long term (current) drug therapy: Secondary | ICD-10-CM | POA: Diagnosis not present

## 2018-04-12 DIAGNOSIS — E1140 Type 2 diabetes mellitus with diabetic neuropathy, unspecified: Secondary | ICD-10-CM

## 2018-04-12 DIAGNOSIS — E1165 Type 2 diabetes mellitus with hyperglycemia: Secondary | ICD-10-CM

## 2018-04-12 LAB — POCT I-STAT CREATININE: Creatinine, Ser: 1.8 mg/dL — ABNORMAL HIGH (ref 0.44–1.00)

## 2018-04-12 MED ORDER — FULVESTRANT 250 MG/5ML IM SOLN
500.0000 mg | Freq: Once | INTRAMUSCULAR | Status: AC
  Administered 2018-04-12: 500 mg via INTRAMUSCULAR

## 2018-04-12 MED ORDER — PALBOCICLIB 125 MG PO CAPS: 125.0000 mg | ORAL_CAPSULE | Freq: Every day | ORAL | 6 refills | Status: DC

## 2018-04-12 NOTE — Telephone Encounter (Signed)
Oral Oncology Pharmacist Encounter  Received new prescription for Ibrance (palbociclib) for the treatment of metastatic, hormone receptor positive, Her-2 receptor negative breast cancer in conjunction with Faslodex, planned duration until disease progression or unacceptable toxicity.  Original diagnosis in January 2014 of stage Ia invasive ductal carcinoma, hormone receptor positive, HER-2 receptor negative Patient is s/p left lumpectomy and sentinel lymph node sampling on 03/12/2012, subsequently had to undergo reexcision on 03/22/2012 Patient received endocrine based adjuvant therapy with anastrozole only at that time  Patient now with metastatic disease again found to be invasive ductal carcinoma, estrogen receptor positive, HER-2 receptor negative, with metastasis to the skin and bone Patient is now under evaluation to start second line endocrine based therapy for her metastatic breast cancer with Ibrance and Faslodex  Faslodex start date: 04/12/2018 [name] start date: 04/26/2018  Labs from [place] assessed, [facility] for treatment initiation. 04/11/18 SCr=1.8, est CrCl ~ 35 mL/min No dose adjustment for renal dysfunction recommended by manufacturer Labs will repeated on 04/26/18  Current medication list in Epic reviewed, moderate DDIs with Ibrance identified:  Category C interactions: Ibrance and amlodipine, atorvastatin, oxycodone, and mirtazapine: [name] is a weak inhibitor and major substrate of CYP3A4 leading to possible decreased metabolism and increased systemic exposure to amlodipine, atorvastatin, oxycodone, and mirtazapine. Vital signs and labs reviewed. OK to continue with concomitant use of agents.  Prescription has been e-scribed to the [facility] by MD for benefits analysis and approval.  Oral Oncology Clinic will continue to follow for insurance authorization, copayment issues, initial counseling and start date.  Johny Drilling, PharmD, BCPS, BCOP  04/12/2018 3:40  PM Oral Oncology Clinic [phone]

## 2018-04-12 NOTE — Telephone Encounter (Signed)
Gave avs and calendar ° °

## 2018-04-12 NOTE — Patient Instructions (Signed)

## 2018-04-15 ENCOUNTER — Other Ambulatory Visit: Payer: Self-pay

## 2018-04-15 ENCOUNTER — Telehealth: Payer: Self-pay

## 2018-04-15 ENCOUNTER — Encounter (HOSPITAL_COMMUNITY): Payer: Self-pay

## 2018-04-15 ENCOUNTER — Inpatient Hospital Stay (HOSPITAL_COMMUNITY): Payer: Medicare HMO

## 2018-04-15 ENCOUNTER — Inpatient Hospital Stay (HOSPITAL_COMMUNITY)
Admission: EM | Admit: 2018-04-15 | Discharge: 2018-04-23 | DRG: 189 | Disposition: A | Discharge status: Home Health | Payer: Medicare HMO | Attending: Pulmonary Disease | Admitting: Pulmonary Disease

## 2018-04-15 ENCOUNTER — Emergency Department (HOSPITAL_COMMUNITY): Payer: Medicare HMO

## 2018-04-15 DIAGNOSIS — J302 Other seasonal allergic rhinitis: Secondary | ICD-10-CM | POA: Diagnosis present

## 2018-04-15 DIAGNOSIS — R0600 Dyspnea, unspecified: Secondary | ICD-10-CM | POA: Diagnosis not present

## 2018-04-15 DIAGNOSIS — Z8249 Family history of ischemic heart disease and other diseases of the circulatory system: Secondary | ICD-10-CM

## 2018-04-15 DIAGNOSIS — Z90710 Acquired absence of both cervix and uterus: Secondary | ICD-10-CM

## 2018-04-15 DIAGNOSIS — IMO0002 Reserved for concepts with insufficient information to code with codable children: Secondary | ICD-10-CM

## 2018-04-15 DIAGNOSIS — E11649 Type 2 diabetes mellitus with hypoglycemia without coma: Secondary | ICD-10-CM | POA: Diagnosis not present

## 2018-04-15 DIAGNOSIS — J209 Acute bronchitis, unspecified: Secondary | ICD-10-CM | POA: Diagnosis present

## 2018-04-15 DIAGNOSIS — Z7984 Long term (current) use of oral hypoglycemic drugs: Secondary | ICD-10-CM

## 2018-04-15 DIAGNOSIS — J810 Acute pulmonary edema: Secondary | ICD-10-CM | POA: Diagnosis not present

## 2018-04-15 DIAGNOSIS — Z66 Do not resuscitate: Secondary | ICD-10-CM | POA: Diagnosis present

## 2018-04-15 DIAGNOSIS — C50812 Malignant neoplasm of overlapping sites of left female breast: Secondary | ICD-10-CM | POA: Diagnosis present

## 2018-04-15 DIAGNOSIS — N179 Acute kidney failure, unspecified: Secondary | ICD-10-CM | POA: Diagnosis not present

## 2018-04-15 DIAGNOSIS — J9601 Acute respiratory failure with hypoxia: Secondary | ICD-10-CM | POA: Diagnosis not present

## 2018-04-15 DIAGNOSIS — I371 Nonrheumatic pulmonary valve insufficiency: Secondary | ICD-10-CM | POA: Diagnosis not present

## 2018-04-15 DIAGNOSIS — E669 Obesity, unspecified: Secondary | ICD-10-CM | POA: Diagnosis present

## 2018-04-15 DIAGNOSIS — I361 Nonrheumatic tricuspid (valve) insufficiency: Secondary | ICD-10-CM

## 2018-04-15 DIAGNOSIS — Z79899 Other long term (current) drug therapy: Secondary | ICD-10-CM

## 2018-04-15 DIAGNOSIS — I130 Hypertensive heart and chronic kidney disease with heart failure and stage 1 through stage 4 chronic kidney disease, or unspecified chronic kidney disease: Secondary | ICD-10-CM | POA: Diagnosis present

## 2018-04-15 DIAGNOSIS — Z96651 Presence of right artificial knee joint: Secondary | ICD-10-CM | POA: Diagnosis present

## 2018-04-15 DIAGNOSIS — E1122 Type 2 diabetes mellitus with diabetic chronic kidney disease: Secondary | ICD-10-CM | POA: Diagnosis not present

## 2018-04-15 DIAGNOSIS — Z7982 Long term (current) use of aspirin: Secondary | ICD-10-CM

## 2018-04-15 DIAGNOSIS — N184 Chronic kidney disease, stage 4 (severe): Secondary | ICD-10-CM | POA: Diagnosis not present

## 2018-04-15 DIAGNOSIS — Z22322 Carrier or suspected carrier of Methicillin resistant Staphylococcus aureus: Secondary | ICD-10-CM

## 2018-04-15 DIAGNOSIS — Z91030 Bee allergy status: Secondary | ICD-10-CM

## 2018-04-15 DIAGNOSIS — K7460 Unspecified cirrhosis of liver: Secondary | ICD-10-CM | POA: Diagnosis present

## 2018-04-15 DIAGNOSIS — Z85820 Personal history of malignant melanoma of skin: Secondary | ICD-10-CM

## 2018-04-15 DIAGNOSIS — R061 Stridor: Secondary | ICD-10-CM | POA: Diagnosis not present

## 2018-04-15 DIAGNOSIS — I509 Heart failure, unspecified: Secondary | ICD-10-CM | POA: Diagnosis not present

## 2018-04-15 DIAGNOSIS — J101 Influenza due to other identified influenza virus with other respiratory manifestations: Secondary | ICD-10-CM | POA: Diagnosis not present

## 2018-04-15 DIAGNOSIS — M6281 Muscle weakness (generalized): Secondary | ICD-10-CM | POA: Diagnosis present

## 2018-04-15 DIAGNOSIS — R296 Repeated falls: Secondary | ICD-10-CM | POA: Diagnosis present

## 2018-04-15 DIAGNOSIS — R0602 Shortness of breath: Secondary | ICD-10-CM | POA: Diagnosis not present

## 2018-04-15 DIAGNOSIS — E1140 Type 2 diabetes mellitus with diabetic neuropathy, unspecified: Secondary | ICD-10-CM | POA: Diagnosis present

## 2018-04-15 DIAGNOSIS — Z6839 Body mass index (BMI) 39.0-39.9, adult: Secondary | ICD-10-CM

## 2018-04-15 DIAGNOSIS — I5033 Acute on chronic diastolic (congestive) heart failure: Secondary | ICD-10-CM | POA: Diagnosis not present

## 2018-04-15 DIAGNOSIS — Z170 Estrogen receptor positive status [ER+]: Secondary | ICD-10-CM | POA: Diagnosis not present

## 2018-04-15 DIAGNOSIS — E785 Hyperlipidemia, unspecified: Secondary | ICD-10-CM | POA: Diagnosis present

## 2018-04-15 DIAGNOSIS — R0689 Other abnormalities of breathing: Secondary | ICD-10-CM | POA: Diagnosis not present

## 2018-04-15 DIAGNOSIS — G8929 Other chronic pain: Secondary | ICD-10-CM | POA: Diagnosis present

## 2018-04-15 DIAGNOSIS — J40 Bronchitis, not specified as acute or chronic: Secondary | ICD-10-CM

## 2018-04-15 DIAGNOSIS — Z823 Family history of stroke: Secondary | ICD-10-CM

## 2018-04-15 DIAGNOSIS — Z9049 Acquired absence of other specified parts of digestive tract: Secondary | ICD-10-CM

## 2018-04-15 DIAGNOSIS — K219 Gastro-esophageal reflux disease without esophagitis: Secondary | ICD-10-CM | POA: Diagnosis present

## 2018-04-15 DIAGNOSIS — C50912 Malignant neoplasm of unspecified site of left female breast: Secondary | ICD-10-CM | POA: Diagnosis present

## 2018-04-15 DIAGNOSIS — J111 Influenza due to unidentified influenza virus with other respiratory manifestations: Secondary | ICD-10-CM | POA: Diagnosis not present

## 2018-04-15 DIAGNOSIS — Z885 Allergy status to narcotic agent status: Secondary | ICD-10-CM

## 2018-04-15 DIAGNOSIS — R0902 Hypoxemia: Secondary | ICD-10-CM | POA: Diagnosis not present

## 2018-04-15 DIAGNOSIS — Z87442 Personal history of urinary calculi: Secondary | ICD-10-CM

## 2018-04-15 DIAGNOSIS — E1165 Type 2 diabetes mellitus with hyperglycemia: Secondary | ICD-10-CM | POA: Diagnosis present

## 2018-04-15 DIAGNOSIS — E119 Type 2 diabetes mellitus without complications: Secondary | ICD-10-CM

## 2018-04-15 DIAGNOSIS — R69 Illness, unspecified: Secondary | ICD-10-CM | POA: Diagnosis not present

## 2018-04-15 DIAGNOSIS — R062 Wheezing: Secondary | ICD-10-CM | POA: Diagnosis not present

## 2018-04-15 DIAGNOSIS — I10 Essential (primary) hypertension: Secondary | ICD-10-CM | POA: Diagnosis present

## 2018-04-15 LAB — CBC WITH DIFFERENTIAL/PLATELET
Abs Immature Granulocytes: 0.08 10*3/uL — ABNORMAL HIGH (ref 0.00–0.07)
BASOS ABS: 0 10*3/uL (ref 0.0–0.1)
Basophils Relative: 0 %
EOS PCT: 0 %
Eosinophils Absolute: 0 10*3/uL (ref 0.0–0.5)
HCT: 31.4 % — ABNORMAL LOW (ref 36.0–46.0)
Hemoglobin: 9.2 g/dL — ABNORMAL LOW (ref 12.0–15.0)
Immature Granulocytes: 1 %
Lymphocytes Relative: 7 %
Lymphs Abs: 0.5 10*3/uL — ABNORMAL LOW (ref 0.7–4.0)
MCH: 25.7 pg — ABNORMAL LOW (ref 26.0–34.0)
MCHC: 29.3 g/dL — AB (ref 30.0–36.0)
MCV: 87.7 fL (ref 80.0–100.0)
Monocytes Absolute: 0.2 10*3/uL (ref 0.1–1.0)
Monocytes Relative: 2 %
Neutro Abs: 6 10*3/uL (ref 1.7–7.7)
Neutrophils Relative %: 90 %
Platelets: 99 10*3/uL — ABNORMAL LOW (ref 150–400)
RBC: 3.58 MIL/uL — ABNORMAL LOW (ref 3.87–5.11)
RDW: 15.3 % (ref 11.5–15.5)
WBC: 6.7 10*3/uL (ref 4.0–10.5)
nRBC: 0 % (ref 0.0–0.2)

## 2018-04-15 LAB — BASIC METABOLIC PANEL
Anion gap: 8 (ref 5–15)
BUN: 23 mg/dL (ref 8–23)
CO2: 26 mmol/L (ref 22–32)
Calcium: 8.5 mg/dL — ABNORMAL LOW (ref 8.9–10.3)
Chloride: 98 mmol/L (ref 98–111)
Creatinine, Ser: 1.47 mg/dL — ABNORMAL HIGH (ref 0.44–1.00)
GFR calc Af Amer: 38 mL/min — ABNORMAL LOW (ref >60)
GFR calc non Af Amer: 33 mL/min — ABNORMAL LOW (ref >60)
Glucose, Bld: 339 mg/dL — ABNORMAL HIGH (ref 70–99)
Potassium: 4.4 mmol/L (ref 3.5–5.1)
Sodium: 132 mmol/L — ABNORMAL LOW (ref 135–145)

## 2018-04-15 LAB — CBG MONITORING, ED
Glucose-Capillary: 306 mg/dL — ABNORMAL HIGH (ref 70–99)
Glucose-Capillary: 298 mg/dL — ABNORMAL HIGH (ref 70–99)

## 2018-04-15 LAB — GLUCOSE, CAPILLARY: Glucose-Capillary: 312 mg/dL — ABNORMAL HIGH (ref 70–99)

## 2018-04-15 MED ORDER — IPRATROPIUM-ALBUTEROL 0.5-2.5 (3) MG/3ML IN SOLN
3.0000 mL | Freq: Four times a day (QID) | RESPIRATORY_TRACT | Status: DC
  Administered 2018-04-15 – 2018-04-19 (×17): 3 mL via RESPIRATORY_TRACT
  Filled 2018-04-15 (×16): qty 3

## 2018-04-15 MED ORDER — IPRATROPIUM-ALBUTEROL 0.5-2.5 (3) MG/3ML IN SOLN
3.0000 mL | Freq: Once | RESPIRATORY_TRACT | Status: AC
  Administered 2018-04-15: 3 mL via RESPIRATORY_TRACT
  Filled 2018-04-15: qty 3

## 2018-04-15 MED ORDER — GABAPENTIN 300 MG PO CAPS
600.0000 mg | ORAL_CAPSULE | Freq: Every day | ORAL | Status: DC
  Administered 2018-04-15 – 2018-04-23 (×9): 600 mg via ORAL
  Filled 2018-04-15 (×9): qty 2

## 2018-04-15 MED ORDER — SACCHAROMYCES BOULARDII 250 MG PO CAPS
250.0000 mg | ORAL_CAPSULE | Freq: Every day | ORAL | Status: DC
  Administered 2018-04-16 – 2018-04-23 (×8): 250 mg via ORAL
  Filled 2018-04-15 (×10): qty 1

## 2018-04-15 MED ORDER — IRBESARTAN 150 MG PO TABS
150.0000 mg | ORAL_TABLET | Freq: Every day | ORAL | Status: DC
  Administered 2018-04-16 – 2018-04-23 (×8): 150 mg via ORAL
  Filled 2018-04-15 (×11): qty 1

## 2018-04-15 MED ORDER — DM-GUAIFENESIN ER 30-600 MG PO TB12
1.0000 | ORAL_TABLET | Freq: Two times a day (BID) | ORAL | Status: DC
  Administered 2018-04-15 – 2018-04-23 (×17): 1 via ORAL
  Filled 2018-04-15 (×17): qty 1

## 2018-04-15 MED ORDER — SODIUM CHLORIDE 0.9% FLUSH: 3.0000 mL | INTRAVENOUS | Status: DC | PRN

## 2018-04-15 MED ORDER — HYDRALAZINE HCL 25 MG PO TABS
50.0000 mg | ORAL_TABLET | Freq: Every morning | ORAL | Status: DC
  Administered 2018-04-16 – 2018-04-23 (×8): 50 mg via ORAL
  Filled 2018-04-15 (×8): qty 2

## 2018-04-15 MED ORDER — DORZOLAMIDE HCL 2 % OP SOLN
1.0000 [drp] | Freq: Two times a day (BID) | OPHTHALMIC | Status: DC
  Administered 2018-04-16 – 2018-04-23 (×15): 1 [drp] via OPHTHALMIC
  Filled 2018-04-15 (×5): qty 10

## 2018-04-15 MED ORDER — PANTOPRAZOLE SODIUM 40 MG PO TBEC
40.0000 mg | DELAYED_RELEASE_TABLET | Freq: Every day | ORAL | Status: DC
  Administered 2018-04-15 – 2018-04-23 (×9): 40 mg via ORAL
  Filled 2018-04-15 (×9): qty 1

## 2018-04-15 MED ORDER — ONDANSETRON HCL 4 MG PO TABS: 4.0000 mg | ORAL_TABLET | Freq: Four times a day (QID) | ORAL | Status: DC | PRN

## 2018-04-15 MED ORDER — INSULIN DEGLUDEC 100 UNIT/ML ~~LOC~~ SOPN: 80.0000 [IU] | PEN_INJECTOR | Freq: Every day | SUBCUTANEOUS | Status: DC

## 2018-04-15 MED ORDER — DOXYCYCLINE HYCLATE 100 MG PO TABS
100.0000 mg | ORAL_TABLET | Freq: Two times a day (BID) | ORAL | Status: DC
  Administered 2018-04-15 – 2018-04-23 (×17): 100 mg via ORAL
  Filled 2018-04-15 (×17): qty 1

## 2018-04-15 MED ORDER — SODIUM CHLORIDE 0.9% FLUSH
3.0000 mL | Freq: Two times a day (BID) | INTRAVENOUS | Status: DC
  Administered 2018-04-15 – 2018-04-22 (×14): 3 mL via INTRAVENOUS

## 2018-04-15 MED ORDER — POLYVINYL ALCOHOL 1.4 % OP SOLN
1.0000 [drp] | Freq: Every day | OPHTHALMIC | Status: DC | PRN
  Filled 2018-04-15: qty 15

## 2018-04-15 MED ORDER — ANASTROZOLE 1 MG PO TABS
1.0000 mg | ORAL_TABLET | Freq: Every day | ORAL | Status: DC
  Administered 2018-04-16 – 2018-04-23 (×8): 1 mg via ORAL
  Filled 2018-04-15 (×12): qty 1

## 2018-04-15 MED ORDER — FUROSEMIDE 10 MG/ML IJ SOLN
40.0000 mg | Freq: Two times a day (BID) | INTRAMUSCULAR | Status: DC
  Administered 2018-04-15 – 2018-04-18 (×7): 40 mg via INTRAVENOUS
  Filled 2018-04-15 (×7): qty 4

## 2018-04-15 MED ORDER — METHYLPREDNISOLONE SODIUM SUCC 40 MG IJ SOLR
40.0000 mg | Freq: Two times a day (BID) | INTRAMUSCULAR | Status: DC
  Administered 2018-04-15 – 2018-04-20 (×10): 40 mg via INTRAVENOUS
  Filled 2018-04-15 (×10): qty 1

## 2018-04-15 MED ORDER — BENAZEPRIL HCL 20 MG PO TABS: 20.0000 mg | ORAL_TABLET | Freq: Every day | ORAL | Status: DC

## 2018-04-15 MED ORDER — ACETAMINOPHEN 325 MG PO TABS
650.0000 mg | ORAL_TABLET | Freq: Four times a day (QID) | ORAL | Status: DC | PRN
  Administered 2018-04-19: 650 mg via ORAL
  Filled 2018-04-15: qty 2

## 2018-04-15 MED ORDER — INSULIN GLARGINE 100 UNIT/ML ~~LOC~~ SOLN
80.0000 [IU] | Freq: Every day | SUBCUTANEOUS | Status: DC
  Administered 2018-04-16 – 2018-04-20 (×6): 80 [IU] via SUBCUTANEOUS
  Filled 2018-04-15 (×7): qty 0.8

## 2018-04-15 MED ORDER — ATORVASTATIN CALCIUM 10 MG PO TABS
10.0000 mg | ORAL_TABLET | Freq: Every evening | ORAL | Status: DC
  Administered 2018-04-16 – 2018-04-22 (×6): 10 mg via ORAL
  Filled 2018-04-15 (×6): qty 1

## 2018-04-15 MED ORDER — ONDANSETRON HCL 4 MG/2ML IJ SOLN: 4.0000 mg | Freq: Four times a day (QID) | INTRAMUSCULAR | Status: DC | PRN

## 2018-04-15 MED ORDER — MIRABEGRON ER 25 MG PO TB24
50.0000 mg | ORAL_TABLET | Freq: Every day | ORAL | Status: DC
  Administered 2018-04-16 – 2018-04-23 (×8): 50 mg via ORAL
  Filled 2018-04-15: qty 1
  Filled 2018-04-15: qty 2
  Filled 2018-04-15: qty 1
  Filled 2018-04-15 (×5): qty 2
  Filled 2018-04-15: qty 1
  Filled 2018-04-15 (×2): qty 2

## 2018-04-15 MED ORDER — BUDESONIDE 0.25 MG/2ML IN SUSP
0.2500 mg | Freq: Two times a day (BID) | RESPIRATORY_TRACT | Status: DC
  Administered 2018-04-15 – 2018-04-23 (×16): 0.25 mg via RESPIRATORY_TRACT
  Filled 2018-04-15 (×16): qty 2

## 2018-04-15 MED ORDER — ACETAMINOPHEN 650 MG RE SUPP: 650.0000 mg | Freq: Four times a day (QID) | RECTAL | Status: DC | PRN

## 2018-04-15 MED ORDER — MIRTAZAPINE 15 MG PO TABS
15.0000 mg | ORAL_TABLET | Freq: Every day | ORAL | Status: DC
  Administered 2018-04-15 – 2018-04-22 (×8): 15 mg via ORAL
  Filled 2018-04-15 (×8): qty 1

## 2018-04-15 MED ORDER — AMLODIPINE BESYLATE 5 MG PO TABS
5.0000 mg | ORAL_TABLET | Freq: Every morning | ORAL | Status: DC
  Administered 2018-04-15 – 2018-04-23 (×9): 5 mg via ORAL
  Filled 2018-04-15 (×9): qty 1

## 2018-04-15 MED ORDER — INSULIN ASPART 100 UNIT/ML ~~LOC~~ SOLN
0.0000 [IU] | Freq: Three times a day (TID) | SUBCUTANEOUS | Status: DC
  Administered 2018-04-15: 11 [IU] via SUBCUTANEOUS
  Administered 2018-04-16: 7 [IU] via SUBCUTANEOUS
  Administered 2018-04-16: 20 [IU] via SUBCUTANEOUS
  Administered 2018-04-16 – 2018-04-17 (×2): 15 [IU] via SUBCUTANEOUS
  Administered 2018-04-17 (×2): 4 [IU] via SUBCUTANEOUS
  Administered 2018-04-18: 7 [IU] via SUBCUTANEOUS
  Administered 2018-04-18: 4 [IU] via SUBCUTANEOUS
  Administered 2018-04-19: 15 [IU] via SUBCUTANEOUS
  Administered 2018-04-19: 11 [IU] via SUBCUTANEOUS
  Administered 2018-04-19: 3 [IU] via SUBCUTANEOUS
  Administered 2018-04-20: 15 [IU] via SUBCUTANEOUS
  Administered 2018-04-21: 11 [IU] via SUBCUTANEOUS
  Administered 2018-04-21: 3 [IU] via SUBCUTANEOUS
  Administered 2018-04-22: 20 [IU] via SUBCUTANEOUS
  Filled 2018-04-15: qty 1

## 2018-04-15 MED ORDER — LATANOPROST 0.005 % OP SOLN
1.0000 [drp] | Freq: Every day | OPHTHALMIC | Status: DC
  Administered 2018-04-16 – 2018-04-22 (×7): 1 [drp] via OPHTHALMIC
  Filled 2018-04-15 (×2): qty 2.5

## 2018-04-15 MED ORDER — ASPIRIN EC 81 MG PO TBEC
81.0000 mg | DELAYED_RELEASE_TABLET | Freq: Every day | ORAL | Status: DC
  Administered 2018-04-15 – 2018-04-23 (×9): 81 mg via ORAL
  Filled 2018-04-15 (×9): qty 1

## 2018-04-15 MED ORDER — HYDRALAZINE HCL 25 MG PO TABS
25.0000 mg | ORAL_TABLET | Freq: Every day | ORAL | Status: DC
  Administered 2018-04-15 – 2018-04-22 (×8): 25 mg via ORAL
  Filled 2018-04-15 (×8): qty 1

## 2018-04-15 MED ORDER — INSULIN ASPART 100 UNIT/ML ~~LOC~~ SOLN
0.0000 [IU] | Freq: Every day | SUBCUTANEOUS | Status: DC
  Administered 2018-04-15: 4 [IU] via SUBCUTANEOUS
  Administered 2018-04-16 – 2018-04-20 (×3): 2 [IU] via SUBCUTANEOUS

## 2018-04-15 MED ORDER — OXYCODONE HCL 5 MG PO TABS
10.0000 mg | ORAL_TABLET | Freq: Every day | ORAL | Status: DC
  Administered 2018-04-15 – 2018-04-23 (×9): 10 mg via ORAL
  Filled 2018-04-15 (×9): qty 2

## 2018-04-15 MED ORDER — FUROSEMIDE 10 MG/ML IJ SOLN
40.0000 mg | Freq: Once | INTRAMUSCULAR | Status: AC
  Administered 2018-04-15: 40 mg via INTRAVENOUS
  Filled 2018-04-15: qty 4

## 2018-04-15 MED ORDER — BRIMONIDINE TARTRATE 0.15 % OP SOLN
1.0000 [drp] | Freq: Two times a day (BID) | OPHTHALMIC | Status: DC
  Administered 2018-04-16 – 2018-04-23 (×15): 1 [drp] via OPHTHALMIC
  Filled 2018-04-15 (×2): qty 5

## 2018-04-15 MED ORDER — SODIUM CHLORIDE 0.9 % IV SOLN: 250.0000 mL | INTRAVENOUS | Status: DC | PRN

## 2018-04-15 MED ORDER — METOPROLOL SUCCINATE ER 50 MG PO TB24
100.0000 mg | ORAL_TABLET | Freq: Every morning | ORAL | Status: DC
  Administered 2018-04-15 – 2018-04-23 (×9): 100 mg via ORAL
  Filled 2018-04-15 (×3): qty 2
  Filled 2018-04-15: qty 4
  Filled 2018-04-15 (×5): qty 2

## 2018-04-15 NOTE — H&P (Signed)
History and Physical    [name]:[id number] DOB: 04/22/1933 DOA: 04/15/2018  PCP: Sinda Du, MD   Patient coming from: Home  Chief Complaint: Dyspnea  HPI: [name] is a 85 y.o. female with medical history significant for type 2 diabetes, hypertension, GERD, dyslipidemia, and recurrent breast cancer stage III that she was recently rediagnosed with.  She presents to the ED with worsening shortness of breath along with dry cough over the last 2 days and received nebulizer treatment and IV steroids in route via EMS.  She has been given a nebulized treatment as well as some IV Lasix.  She is noted to have some bilateral lower extremity edema and has not been checking her weight.  She denies any orthopnea, chest pain, fever, or chills.  She has been taking her other medications as usual.  Daughter at bedside does state that patient has been in and out of hospitals quite frequently recently due to the recent cancer diagnosis.   ED Course: Vital signs are noted to be stable, but patient is hypoxemic and desats into the 80th percentile particularly with ambulation.  She is currently on 3 L nasal cannula with saturations in the low 90th percentile and continues to have active wheezing.  Two-view chest x-ray with increased bronchovesicular markings, but no pneumonia.  She states that the breathing treatment has helped slightly.  Laboratory data still pending as well as flu swab that has been ordered.  Review of Systems: All others reviewed and otherwise negative..   Past Medical History:  Diagnosis Date  . Abrasion of arm, left    secondary to fall   . Anemia   . Arthritis   . Breast cancer, left ([place])    er/pr +  . Cataract    no surgery as yet,starting of 3 years  . Chronic kidney disease    renal stones, one episode of Lithotripsy  . Cirrhosis (HCC)    Metavir score 4  . Complication of anesthesia   . Diabetes mellitus   . GERD (gastroesophageal reflux disease)   .  Hyperlipidemia   . Hypertension    followed by [place] grp. relative to  urology study grp.  Doesn't report ever having a stress test   . Mental disorder   . Multiple falls   . Nocturia   . PONV (postoperative nausea and vomiting)   . Seasonal allergies   . Skin cancer    face- melanoma, 2012- surg. excision      Past Surgical History:  Procedure Laterality Date  . ABDOMINAL HYSTERECTOMY    . BACK SURGERY     x4 back surgery to include rods & screws  . Last in May 2014  . BREAST LUMPECTOMY WITH NEEDLE LOCALIZATION AND AXILLARY SENTINEL LYMPH NODE BX  03/12/2012   Procedure: BREAST LUMPECTOMY WITH NEEDLE LOCALIZATION AND AXILLARY SENTINEL LYMPH NODE BX;  Surgeon: Merrie Roof, MD;  Location: [place];  Service: General;  Laterality: Left;  . BREAST SURGERY     lumpectomy x2/left   . CHOLECYSTECTOMY    . COLONOSCOPY N/A 11/20/2013   Dr.Rourk- diverticula was found in the left colon. o/w normal rectal, colonic and terminal ileal mucosa  . ESOPHAGOGASTRODUODENOSCOPY N/A 11/20/2013   Dr.Rourk- abnormal mucosa was found. diffuse snake skinning and friability of the gastric mucosa. patent pylorus. abnormal-appearing first, second and third portion of the duodenum. 76mm gastric nodule at the GE junction. stomach bx= minimal chronic infammation, GE junction bx= polypoid fragments of gastric  cardia type mucosa w/mod chronic inflammation and foveolar hyperplasia  . EYE SURGERY     cataract surgery bilat   . FRACTURE SURGERY     1975- ORIF- L ankle   . GIVENS CAPSULE STUDY N/A 02/24/2014   Procedure: GIVENS CAPSULE STUDY;  Surgeon: Daneil Dolin, MD;  [facility];  Service: Endoscopy;  Laterality: N/A;  . RE-EXCISION OF BREAST LUMPECTOMY  03/22/2012   Procedure: RE-EXCISION OF BREAST LUMPECTOMY;  Surgeon: Merrie Roof, MD;  Location: [place];  Service: General;  Laterality: Left;  re-excision left breast lumpectomy cavity  ER+, PR+  . ROTATOR CUFF REPAIR Bilateral     . SHOULDER SURGERY    . SKIN BIOPSY    . TOTAL KNEE ARTHROPLASTY Right 12/18/2014   Procedure: RIGHT TOTAL KNEE ARTHROPLASTY;  Surgeon: Sydnee Cabal, MD;  Location: [facility];  Service: Orthopedics;  Laterality: Right;  . TUBAL LIGATION       reports that she has never smoked. She has never used smokeless tobacco. She reports that she does not drink alcohol or use drugs.  Allergies  Allergen Reactions  . Bee Venom Anaphylaxis and Swelling  . Hydromorphone Hcl Other (See Comments)    Agitation, Confusion  . Demerol Nausea Only    Family History  Problem Relation Age of Onset  . Stroke Mother        mini-strokes  . Heart disease Father   . Emphysema Father   . Cancer Brother        metastatic, unsure primary  . Colon cancer Neg Hx   . Prostate cancer Neg Hx     Prior to Admission medications   Medication Sig Start Date End Date Taking? Authorizing Provider  ALPHAGAN P 0.1 % SOLN Place 1 drop into both eyes 2 (two) times daily.  08/01/16  Yes [provider]  amLODipine (NORVASC) 5 MG tablet Take 5 mg by mouth every morning.    Yes [provider]  anastrozole (ARIMIDEX) 1 MG tablet Take 1 mg by mouth daily.   Yes [provider]  aspirin EC 81 MG tablet Take 81 mg by mouth daily.   Yes [provider]  atorvastatin (LIPITOR) 10 MG tablet Take 10 mg by mouth every evening.    Yes [provider]  Bacillus Coagulans-Inulin (PROBIOTIC FORMULA PO) Take 1 capsule by mouth daily.   Yes [provider]  benazepril (LOTENSIN) 20 MG tablet Take 20 mg by mouth daily.    Yes [provider]  cyanocobalamin (,VITAMIN B-12,) 1000 MCG/ML injection Inject 1,000 mcg into the muscle every 30 (thirty) days.  05/14/17  Yes [provider]  dorzolamide (TRUSOPT) 2 % ophthalmic solution Place 1 drop into both eyes 2 (two) times daily.    Yes [provider]  EPIPEN 2-PAK 0.3 MG/0.3ML SOAJ injection Inject 0.3 mg into the  muscle as needed for anaphylaxis.    Yes [provider]  fulvestrant (FASLODEX) 250 MG/5ML injection Inject 500 mg into the muscle every 14 (fourteen) days. One injection each buttock over 1-2 minutes. Warm prior to use.   Yes [provider]  gabapentin (NEURONTIN) 600 MG tablet Take 600 mg by mouth daily.    Yes [provider]  glipiZIDE (GLUCOTROL) 5 MG tablet Take 5 mg by mouth daily before breakfast.  07/31/16  Yes [provider]  hydrALAZINE (APRESOLINE) 25 MG tablet Take 25-50 mg by mouth See admin instructions. Take 50 mg by mouth  in the morning and take 25 mg by mouth at bedtime   Yes [provider]  irbesartan (AVAPRO) 150 MG tablet Take 150 mg by mouth daily.   Yes [provider]  latanoprost (XALATAN) 0.005 % ophthalmic solution Place 1 drop into both eyes at bedtime.  01/17/18  Yes [provider]  metFORMIN (GLUCOPHAGE) 1000 MG tablet Take 1,000 mg by mouth daily.  11/07/17  Yes [provider]  metoprolol succinate (TOPROL-XL) 100 MG 24 hr tablet Take 100 mg by mouth every morning. Take with or immediately following a meal.   Yes [provider]  mirtazapine (REMERON) 15 MG tablet Take 15 mg by mouth at bedtime.  08/04/15  Yes [provider]  MYRBETRIQ 50 MG TB24 tablet Take 50 mg by mouth daily.  01/15/18  Yes [provider]  omeprazole (PRILOSEC) 20 MG capsule Take 20 mg by mouth daily.   Yes [provider]  Oxycodone HCl 10 MG TABS Take 10 mg by mouth daily.  08/26/15  Yes [provider]  Polyethyl Glycol-Propyl Glycol (SYSTANE ULTRA) 0.4-0.3 % SOLN Place 1-2 drops into both eyes daily as needed (for itching eyes).   Yes [provider]  torsemide (DEMADEX) 20 MG tablet Take 1 tablet (20 mg total) by mouth daily. 08/21/17  Yes Sinda Du, MD  TRESIBA FLEXTOUCH 100 UNIT/ML SOPN FlexTouch Pen Inject 80 Units into the skin daily.  01/08/18  Yes [provider]  palbociclib (IBRANCE) 125 MG capsule Take 1 capsule (125 mg total) by mouth daily with breakfast. Take whole with food. Take for 21 days on, 7 days off, repeat every 28 days. Patient not taking: Reported on 04/15/2018 04/12/18   Chauncey Cruel, MD    Physical Exam: Vitals:   04/15/18 1324 04/15/18 1326 04/15/18 1330 04/15/18 1400  BP: (!) 183/75  (!) 156/75 (!) 173/81  Pulse:  76 75 71  Resp:  (!) 23 17 16   Temp:      TempSrc:      SpO2:  94% 97% 93%  Weight:      Height:        Constitutional: NAD, calm, comfortable Vitals:   04/15/18 1324 04/15/18 1326 04/15/18 1330 04/15/18 1400  BP: (!) 183/75  (!) 156/75 (!) 173/81  Pulse:  76 75 71  Resp:  (!) 23 17 16   Temp:      TempSrc:      SpO2:  94% 97% 93%  Weight:      Height:       Eyes: lids and conjunctivae normal ENMT: Mucous membranes are moist.  Neck: normal, supple Respiratory: clear to auscultation bilaterally. Normal respiratory effort. No accessory muscle use.  Cardiovascular: Regular rate and rhythm, no murmurs. No extremity edema. Abdomen: no tenderness, no distention. Bowel sounds positive.  Musculoskeletal:  No joint deformity upper and lower extremities.   Skin: no rashes, lesions, ulcers.  Psychiatric: Normal judgment and insight. Alert and oriented x 3. Normal mood.   Labs on Admission: I have personally reviewed following labs and imaging studies  CBC: Recent Labs  Lab 04/15/18 1411  WBC 6.7  NEUTROABS 6.0  HGB 9.2*  HCT 31.4*  MCV 87.7  PLT 99*   Basic Metabolic Panel: Recent Labs  Lab 04/11/18 1035 04/15/18 1411  NA  --  132*  K  --  4.4  CL  --  98  CO2  --  26  GLUCOSE  --  339*  BUN  --  23  CREATININE 1.80* 1.47*  CALCIUM  --  8.5*   GFR: Estimated Creatinine Clearance: 29.6 mL/min (A) (by C-G formula based on SCr of 1.47 mg/dL (H)). Liver Function Tests: No results for input(s): AST, ALT, ALKPHOS, BILITOT, PROT, ALBUMIN in the last 168 hours. No results for  input(s): LIPASE, AMYLASE in the last 168 hours. No results for input(s): AMMONIA in the last 168 hours. Coagulation Profile: No results for input(s): INR, PROTIME in the last 168 hours. Cardiac Enzymes: No results for input(s): CKTOTAL, CKMB, CKMBINDEX, TROPONINI in the last 168 hours. BNP (last 3 results) No results for input(s): PROBNP in the last 8760 hours. HbA1C: No results for input(s): HGBA1C in the last 72 hours. CBG: Recent Labs  Lab 04/15/18 1433  GLUCAP 306*   Lipid Profile: No results for input(s): CHOL, HDL, LDLCALC, TRIG, CHOLHDL, LDLDIRECT in the last 72 hours. Thyroid Function Tests: No results for input(s): TSH, T4TOTAL, FREET4, T3FREE, THYROIDAB in the last 72 hours. Anemia Panel: No results for input(s): VITAMINB12, FOLATE, FERRITIN, TIBC, IRON, RETICCTPCT in the last 72 hours. Urine analysis:    Component Value Date/Time   COLORURINE YELLOW 08/17/2017 2105   APPEARANCEUR CLEAR 08/17/2017 2105   LABSPEC 1.013 08/17/2017 2105   PHURINE 5.0 08/17/2017 2105   GLUCOSEU NEGATIVE 08/17/2017 2105   HGBUR NEGATIVE 08/17/2017 2105   BILIRUBINUR NEGATIVE 08/17/2017 2105   KETONESUR NEGATIVE 08/17/2017 2105   PROTEINUR NEGATIVE 08/17/2017 2105   UROBILINOGEN 1.0 12/11/2014 1106   NITRITE NEGATIVE 08/17/2017 2105   LEUKOCYTESUR TRACE (A) 08/17/2017 2105    Radiological Exams on Admission: Dg Chest 2 View  Result Date: 04/15/2018 CLINICAL DATA:  Shortness of breath/hypoxia. EXAM: CHEST - 2 VIEW COMPARISON:  08/17/2017 and chest CT 04/11/2018 FINDINGS: Lungs are somewhat hypoinflated demonstrate hazy prominence of the central bronchovascular markings without focal lobar consolidation or effusion. Cardiomediastinal silhouette and remainder of the exam is unchanged. IMPRESSION: Hypoinflation with hazy prominence of the bilateral central bronchovascular markings which may be due to mild interstitial edema versus acute bronchitic process. Electronically Signed   By: Marin Olp M.D.   On: 04/15/2018 09:50    EKG: Independently reviewed. SR 87bpm.  Assessment/Plan Principal Problem:   Acute hypoxemic respiratory failure (HCC) Active Problems:   DM type 2 (diabetes mellitus, type 2) (HCC)   Hyperlipidemia   GERD (gastroesophageal reflux disease)   Essential hypertension, benign   Recurrent breast adenocarcinoma, left (HCC)   Acute on chronic diastolic CHF (congestive heart failure) ([place])    1. Acute hypoxemic respiratory failure-likely multifactorial.  I believe there is a component of acute on chronic diastolic congestive heart failure as well as wheezing related to upper respiratory infection that is contributing.  Will maintain on Lasix diuresis and obtain repeat 2D echocardiogram with prior performed in 2016 with LVEF of 60 to 65% and grade 1 diastolic dysfunction noted at that time.  Will maintain on Pulmicort as well as IV methylprednisolone and duo nebs every 6 hours.  Influenza swab currently pending.  We will also maintain on doxycycline twice daily.  PCP Dr. Luan Pulling to follow in a.m. 2. Acute on chronic diastolic heart failure.  Will obtain daily weights and maintain on Lasix for diuresis and hold home torsemide.  2D echocardiogram will be repeated. 3. Type 2 diabetes with hyperglycemia.  Maintain on high-dose SSI coverage and avoid metformin for now.  Carb modified diet. 4. Hypertension.  Maintain on home blood pressure agents. 5. GERD.  PPI. 6. Dyslipidemia.  Maintain  on statin.   DVT prophylaxis: SCDs Code Status: DNR Family Communication: Daughter at bedside Disposition Plan: Admit for treatment of wheezing and URI along with diuresis; repeat 2D echo Consults called:None Admission status: Inpatient, [place] [facility] Pager [phone]  If 7PM-7AM, please contact night-coverage [email or web address] Password [facility]  04/15/2018, 2:48 PM

## 2018-04-15 NOTE — Progress Notes (Signed)
*  PRELIMINARY RESULTS* Echocardiogram 2D Echocardiogram has been performed.  [name] 04/15/2018, 4:28 PM

## 2018-04-15 NOTE — Telephone Encounter (Signed)
Oral Oncology Patient Advocate Encounter  Prior Authorization for [name] has been approved.    PA# [id number] Effective dates: 02/25/18 through 02/27/19  Oral Oncology Clinic will continue to follow.   [place] Patient [place] Phone [phone] Fax [phone]

## 2018-04-15 NOTE — ED Triage Notes (Signed)
Pt brought in by EMS due to SOB. Pt with SOb since yesterday. Pt went to Dr Luan Pulling and brought here by EMS.. Pt was 88 % on roomair  Increase to 98 5 with Alb 5 mg neb and was adm 125 mg solumedrol

## 2018-04-15 NOTE — ED Notes (Signed)
Ambulate down Ed hall.   o2 initial was 87%.  Dropped to 80%.

## 2018-04-15 NOTE — ED Notes (Signed)
Have paged lab to come draw blood work STAT

## 2018-04-15 NOTE — ED Provider Notes (Signed)
[facility] EMERGENCY DEPARTMENT Provider Note   CSN: [id number] Arrival date & time: 04/15/18  2774     History   Chief Complaint Chief Complaint  Patient presents with  . Shortness of Breath    HPI [name] is a 85 y.o. female.  Level 5 caveat for acuity of condition.  Cough, shortness of breath for 2 days.  Patient was given an nebulizer treatment and IV steroids in route via EMS.  She was recently diagnosed with stage III breast cancer.  Pulse ox 88% on room air.  Review of systems positive for extremity swelling.  No chest pain.  Severity of symptoms is moderate.     Past Medical History:  Diagnosis Date  . Abrasion of arm, left    secondary to fall   . Anemia   . Arthritis   . Breast cancer, left ([place])    er/pr +  . Cataract    no surgery as yet,starting of 3 years  . Chronic kidney disease    renal stones, one episode of Lithotripsy  . Cirrhosis (HCC)    Metavir score 4  . Complication of anesthesia   . Diabetes mellitus   . GERD (gastroesophageal reflux disease)   . Hyperlipidemia   . Hypertension    followed by [facility]. relative to  urology study grp.  Doesn't report ever having a stress test   . Mental disorder   . Multiple falls   . Nocturia   . PONV (postoperative nausea and vomiting)   . Seasonal allergies   . Skin cancer    face- melanoma, 2012- surg. excision      Patient Active Problem List   Diagnosis Date Noted  . Malignant neoplasm of overlapping sites of left breast in female, estrogen receptor positive ([place]) 04/03/2018  . Recurrent breast adenocarcinoma, left ([place]) 04/03/2018  . Altered mental status 08/18/2017  . Acute encephalopathy 08/17/2017  . Encephalopathy acute 08/17/2017  . Encephalopathy 07/25/2017  . Diabetes mellitus ([place]) 09/02/2015  . Anemia due to multiple mechanisms 02/17/2015  . Chronic LBP 02/17/2015  . Impaired renal function 02/17/2015  . Abducens nerve weakness 02/17/2015  . S/P knee replacement  12/18/2014  . Osteoarthritis of right knee 12/18/2014  . IDA (iron deficiency anemia) 10/29/2013  . Vitamin B12 deficiency anemia 09/09/2013  . Anemia, iron deficiency 09/09/2013  . Lumbago 08/06/2013  . Difficulty in walking(719.7) 08/06/2013  . Osteopenia 03/11/2013  . Malignant neoplasm of lower-inner quadrant of left breast in female, estrogen receptor positive ([place]) 03/10/2013  . Bilateral leg weakness 09/23/2012  . Acute renal failure ([place]) 07/25/2012  . Dehydration 07/25/2012  . DM type 2, uncontrolled, with neuropathy ([place]) 07/25/2012  . DM (diabetes mellitus), type 2, uncontrolled ([place]) 07/25/2012  . Essential hypertension, benign 07/25/2012  . Generalized weakness 07/25/2012  . Hyperlipidemia   . Chronic kidney disease   . Hypertension   . GERD (gastroesophageal reflux disease)   . CAP (community acquired pneumonia) 04/19/2011  . Nausea and vomiting 04/19/2011  . HTN (hypertension) 04/19/2011  . DM type 2 (diabetes mellitus, type 2) ([place]) 04/19/2011  . Arthritis 04/19/2011    Past Surgical History:  Procedure Laterality Date  . ABDOMINAL HYSTERECTOMY    . BACK SURGERY     x4 back surgery to include rods & screws  . Last in May 2014  . BREAST LUMPECTOMY WITH NEEDLE LOCALIZATION AND AXILLARY SENTINEL LYMPH NODE BX  03/12/2012   Procedure: BREAST LUMPECTOMY WITH NEEDLE LOCALIZATION AND AXILLARY SENTINEL  LYMPH NODE BX;  Surgeon: Merrie Roof, MD;  Location: [place];  Service: General;  Laterality: Left;  . BREAST SURGERY     lumpectomy x2/left   . CHOLECYSTECTOMY    . COLONOSCOPY N/A 11/20/2013   Dr.Rourk- diverticula was found in the left colon. o/w normal rectal, colonic and terminal ileal mucosa  . ESOPHAGOGASTRODUODENOSCOPY N/A 11/20/2013   Dr.Rourk- abnormal mucosa was found. diffuse snake skinning and friability of the gastric mucosa. patent pylorus. abnormal-appearing first, second and third portion of the duodenum. 39mm gastric nodule at the GE junction. stomach bx=  minimal chronic infammation, GE junction bx= polypoid fragments of gastric cardia type mucosa w/mod chronic inflammation and foveolar hyperplasia  . EYE SURGERY     cataract surgery bilat   . FRACTURE SURGERY     1975- ORIF- L ankle   . GIVENS CAPSULE STUDY N/A 02/24/2014   Procedure: GIVENS CAPSULE STUDY;  Surgeon: Daneil Dolin, MD;  [facility];  Service: Endoscopy;  Laterality: N/A;  . RE-EXCISION OF BREAST LUMPECTOMY  03/22/2012   Procedure: RE-EXCISION OF BREAST LUMPECTOMY;  Surgeon: Merrie Roof, MD;  Location: [place];  Service: General;  Laterality: Left;  re-excision left breast lumpectomy cavity  ER+, PR+  . ROTATOR CUFF REPAIR Bilateral   . SHOULDER SURGERY    . SKIN BIOPSY    . TOTAL KNEE ARTHROPLASTY Right 12/18/2014   Procedure: RIGHT TOTAL KNEE ARTHROPLASTY;  Surgeon: Sydnee Cabal, MD;  Location: [facility];  Service: Orthopedics;  Laterality: Right;  . TUBAL LIGATION       OB History    Gravida  2   Para  2   Term  2   Preterm      AB      Living  2     SAB      TAB      Ectopic      Multiple      Live Births               Home Medications    Prior to Admission medications   Medication Sig Start Date End Date Taking? Authorizing Provider  ALPHAGAN P 0.1 % SOLN Place 1 drop into both eyes 2 (two) times daily.  08/01/16  Yes [provider]  amLODipine (NORVASC) 5 MG tablet Take 5 mg by mouth every morning.    Yes [provider]  anastrozole (ARIMIDEX) 1 MG tablet Take 1 mg by mouth daily.   Yes [provider]  aspirin EC 81 MG tablet Take 81 mg by mouth daily.   Yes [provider]  atorvastatin (LIPITOR) 10 MG tablet Take 10 mg by mouth every evening.    Yes [provider]  Bacillus Coagulans-Inulin (PROBIOTIC FORMULA PO) Take 1 capsule by mouth daily.   Yes [provider]  benazepril (LOTENSIN) 20 MG tablet Take 20 mg by mouth daily.    Yes [provider]  cyanocobalamin (,VITAMIN B-12,) 1000 MCG/ML injection Inject 1,000 mcg into the muscle every 30 (thirty) days.  05/14/17  Yes [provider]  dorzolamide (TRUSOPT) 2 % ophthalmic solution Place 1 drop into both eyes 2 (two) times daily.    Yes [provider]  EPIPEN 2-PAK 0.3 MG/0.3ML SOAJ injection Inject 0.3 mg into the muscle as needed for anaphylaxis.    Yes [provider]  fulvestrant (FASLODEX) 250 MG/5ML injection Inject 500 mg into the muscle every 14 (fourteen) days.  One injection each buttock over 1-2 minutes. Warm prior to use.   Yes [provider]  gabapentin (NEURONTIN) 600 MG tablet Take 600 mg by mouth daily.    Yes [provider]  glipiZIDE (GLUCOTROL) 5 MG tablet Take 5 mg by mouth daily before breakfast.  07/31/16  Yes [provider]  hydrALAZINE (APRESOLINE) 25 MG tablet Take 25-50 mg by mouth See admin instructions. Take 50 mg by mouth in the morning and take 25 mg by mouth at bedtime   Yes [provider]  irbesartan (AVAPRO) 150 MG tablet Take 150 mg by mouth daily.   Yes [provider]  latanoprost (XALATAN) 0.005 % ophthalmic solution Place 1 drop into both eyes at bedtime.  01/17/18  Yes [provider]  metFORMIN (GLUCOPHAGE) 1000 MG tablet Take 1,000 mg by mouth daily.  11/07/17  Yes [provider]  metoprolol succinate (TOPROL-XL) 100 MG 24 hr tablet Take 100 mg by mouth every morning. Take with or immediately following a meal.   Yes [provider]  mirtazapine (REMERON) 15 MG tablet Take 15 mg by mouth at bedtime.  08/04/15  Yes [provider]  MYRBETRIQ 50 MG TB24 tablet Take 50 mg by mouth daily.  01/15/18  Yes [provider]  omeprazole (PRILOSEC) 20 MG capsule Take 20 mg by mouth daily.   Yes [provider]  Oxycodone HCl 10 MG TABS Take 10 mg by mouth daily.  08/26/15  Yes [provider]  Polyethyl Glycol-Propyl  Glycol (SYSTANE ULTRA) 0.4-0.3 % SOLN Place 1-2 drops into both eyes daily as needed (for itching eyes).   Yes [provider]  torsemide (DEMADEX) 20 MG tablet Take 1 tablet (20 mg total) by mouth daily. 08/21/17  Yes Sinda Du, MD  TRESIBA FLEXTOUCH 100 UNIT/ML SOPN FlexTouch Pen Inject 80 Units into the skin daily.  01/08/18  Yes [provider]  palbociclib (IBRANCE) 125 MG capsule Take 1 capsule (125 mg total) by mouth daily with breakfast. Take whole with food. Take for 21 days on, 7 days off, repeat every 28 days. Patient not taking: Reported on 04/15/2018 04/12/18   Magrinat, Virgie Dad, MD    Family History Family History  Problem Relation Age of Onset  . Stroke Mother        mini-strokes  . Heart disease Father   . Emphysema Father   . Cancer Brother        metastatic, unsure primary  . Colon cancer Neg Hx   . Prostate cancer Neg Hx     Social History Social History   Tobacco Use  . Smoking status: Never Smoker  . Smokeless tobacco: Never Used  Substance Use Topics  . Alcohol use: No  . Drug use: No     Allergies   Bee venom; Hydromorphone hcl; and Demerol   Review of Systems Review of Systems  Unable to perform ROS: Acuity of condition     Physical Exam Updated Vital Signs BP (!) 148/73   Pulse 78   Temp 97.9 F (36.6 C) (Oral)   Resp (!) 21   Ht 5' (1.524 m)   Wt 90.7 kg   SpO2 96%   BMI 39.06 kg/m   Physical Exam Vitals signs and nursing note reviewed.  Constitutional:      Appearance: She is well-developed.     Comments: Obese, no respiratory distress  HENT:     Head: Normocephalic and atraumatic.  Eyes:     Conjunctiva/sclera:  Conjunctivae normal.  Neck:     Musculoskeletal: Neck supple.  Cardiovascular:     Rate and Rhythm: Normal rate and regular rhythm.  Pulmonary:     Effort: Pulmonary effort is normal.  Abdominal:     General: Bowel sounds are normal.     Palpations: Abdomen is soft.  Musculoskeletal:  Normal range of motion.  Skin:    General: Skin is warm and dry.     Comments: 2+ peripheral edema  Neurological:     Mental Status: She is oriented to person, place, and time.  Psychiatric:        Behavior: Behavior normal.      ED Treatments / Results  Labs (all labs ordered are listed, but only abnormal results are displayed) Labs Reviewed - No data to display  EKG EKG Interpretation  Date/Time:  Monday April 15 2018 09:01:53 EST Ventricular Rate:  87 PR Interval:    QRS Duration: 88 QT Interval:  362 QTC Calculation: 436 R Axis:   103 Text Interpretation:  Sinus rhythm Right axis deviation Low voltage, precordial leads Confirmed by Nat Christen [phone]) on 04/15/2018 9:07:33 AM   Radiology Dg Chest 2 View  Result Date: 04/15/2018 CLINICAL DATA:  Shortness of breath/hypoxia. EXAM: CHEST - 2 VIEW COMPARISON:  08/17/2017 and chest CT 04/11/2018 FINDINGS: Lungs are somewhat hypoinflated demonstrate hazy prominence of the central bronchovascular markings without focal lobar consolidation or effusion. Cardiomediastinal silhouette and remainder of the exam is unchanged. IMPRESSION: Hypoinflation with hazy prominence of the bilateral central bronchovascular markings which may be due to mild interstitial edema versus acute bronchitic process. Electronically Signed   By: Marin Olp M.D.   On: 04/15/2018 09:50    Procedures Procedures (including critical care time)  Medications Ordered in ED Medications  furosemide (LASIX) injection 40 mg (has no administration in time range)  ipratropium-albuterol (DUONEB) 0.5-2.5 (3) MG/3ML nebulizer solution 3 mL (3 mLs Nebulization Given 04/15/18 1113)     Initial Impression / Assessment and Plan / ED Course  I have reviewed the triage vital signs and the nursing notes.  Pertinent labs & imaging results that were available during my care of the patient were reviewed by me and considered in my medical decision making (see chart for  details).  Clinical Course as of Apr 15 1310  Mon Apr 15, 2018  0916 DG Chest 2 View [SJ]  [phone] DG Chest 2 View [SJ]  0935 DG Chest 2 View [SJ]  [phone] ED EKG [SJ]    Clinical Course User Index [SJ] Leward Quan, [place]    Patient presents with cough and URI symptoms.  She desaturates from 87% to 80% after a brief ambulation.  Chest x-ray reveals acute bronchitis versus pulmonary edema.  Rx nebulizer treatment and IV Lasix in the ED.  Admit.   CRITICAL CARE Performed by: Nat Christen  ?  Total critical care time: 30 minutes  Critical care time was exclusive of separately billable procedures and treating other patients.  Critical care was necessary to treat or prevent imminent or life-threatening deterioration.  Critical care was time spent personally by me on the following activities: development of treatment plan with patient and/or surrogate as well as nursing, discussions with consultants, evaluation of patient's response to treatment, examination of patient, obtaining history from patient or surrogate, ordering and performing treatments and interventions, ordering and review of laboratory studies, ordering and review of radiographic studies, pulse oximetry and re-evaluation of patient's condition.  Final Clinical Impressions(s) / ED Diagnoses  Final diagnoses:  Dyspnea, unspecified type  Bronchitis  Acute pulmonary edema [facility])    ED Discharge Orders    None       Nat Christen, MD 04/15/18 1317

## 2018-04-15 NOTE — Telephone Encounter (Signed)
Oral Oncology Patient Advocate Encounter  Received notification from [facility] that prior authorization for [name] is required.  PA submitted on CoverMyMeds Key [id number] Status is pending  Oral Oncology Clinic will continue to follow.  [place] Patient [place] Phone [phone] Fax [phone]

## 2018-04-15 NOTE — ED Notes (Signed)
Placed purewick on patient. Patient tolerated well.

## 2018-04-16 LAB — GLUCOSE, CAPILLARY
GLUCOSE-CAPILLARY: 231 mg/dL — AB (ref 70–99)
Glucose-Capillary: 310 mg/dL — ABNORMAL HIGH (ref 70–99)
Glucose-Capillary: 379 mg/dL — ABNORMAL HIGH (ref 70–99)
Glucose-Capillary: 224 mg/dL — ABNORMAL HIGH (ref 70–99)
Glucose-Capillary: 257 mg/dL — ABNORMAL HIGH (ref 70–99)

## 2018-04-16 LAB — BASIC METABOLIC PANEL
Anion gap: 10 (ref 5–15)
BUN: 32 mg/dL — ABNORMAL HIGH (ref 8–23)
CO2: 27 mmol/L (ref 22–32)
Calcium: 8.6 mg/dL — ABNORMAL LOW (ref 8.9–10.3)
Chloride: 98 mmol/L (ref 98–111)
Creatinine, Ser: 1.62 mg/dL — ABNORMAL HIGH (ref 0.44–1.00)
GFR calc Af Amer: 34 mL/min — ABNORMAL LOW (ref >60)
GFR, EST NON AFRICAN AMERICAN: 29 mL/min — AB (ref >60)
Glucose, Bld: 342 mg/dL — ABNORMAL HIGH (ref 70–99)
Potassium: 4.3 mmol/L (ref 3.5–5.1)
Sodium: 135 mmol/L (ref 135–145)

## 2018-04-16 LAB — CBC
HCT: 31 % — ABNORMAL LOW (ref 36.0–46.0)
HEMOGLOBIN: 9 g/dL — AB (ref 12.0–15.0)
MCH: 25.6 pg — ABNORMAL LOW (ref 26.0–34.0)
MCHC: 29 g/dL — AB (ref 30.0–36.0)
MCV: 88.3 fL (ref 80.0–100.0)
Platelets: 103 10*3/uL — ABNORMAL LOW (ref 150–400)
RBC: 3.51 MIL/uL — ABNORMAL LOW (ref 3.87–5.11)
RDW: 15.3 % (ref 11.5–15.5)
WBC: 5.5 10*3/uL (ref 4.0–10.5)
nRBC: 0 % (ref 0.0–0.2)

## 2018-04-16 LAB — INFLUENZA PANEL BY PCR (TYPE A & B)
INFLBPCR: NEGATIVE
Influenza A By PCR: POSITIVE — AB

## 2018-04-16 LAB — MRSA PCR SCREENING: MRSA by PCR: POSITIVE — AB

## 2018-04-16 MED ORDER — OSELTAMIVIR PHOSPHATE 30 MG PO CAPS
30.0000 mg | ORAL_CAPSULE | Freq: Every day | ORAL | Status: AC
  Administered 2018-04-16 – 2018-04-20 (×5): 30 mg via ORAL
  Filled 2018-04-16 (×5): qty 1

## 2018-04-16 MED ORDER — OSELTAMIVIR PHOSPHATE 30 MG PO CAPS: 30.0000 mg | ORAL_CAPSULE | Freq: Two times a day (BID) | ORAL | Status: DC

## 2018-04-16 MED ORDER — DOCUSATE SODIUM 100 MG PO CAPS
100.0000 mg | ORAL_CAPSULE | Freq: Two times a day (BID) | ORAL | Status: DC
  Administered 2018-04-16 – 2018-04-23 (×15): 100 mg via ORAL
  Filled 2018-04-16 (×15): qty 1

## 2018-04-16 NOTE — Progress Notes (Signed)
Inpatient Diabetes Program Recommendations  AACE/ADA: New Consensus Statement on Inpatient Glycemic Control (2015)  Target Ranges:  Prepandial:   less than 140 mg/dL      Peak postprandial:   less than 180 mg/dL (1-2 hours)      Critically ill patients:  140 - 180 mg/dL   Lab Results  Component Value Date   GLUCAP 310 (H) 04/16/2018   HGBA1C 9.1 (H) 07/25/2017    Review of Glycemic Control Results for LAMYIAH, CRAWSHAW (MRN [id number]) as of 04/16/2018 10:45  Ref. Range 04/15/2018 14:33 04/15/2018 18:02 04/15/2018 22:10 04/16/2018 08:02  Glucose-Capillary Latest Ref Range: 70 - 99 mg/dL 306 (H) 298 (H) 312 (H) 310 (H)   Diabetes history: DM2 Outpatient Diabetes medications: Tresiba 80 units + Glucotrol 5 mg qd + Metformin 1 gm qd Current orders for Inpatient glycemic control: Lantus 80 units + Novolog resistant tid + hs 0-5 units  Inpatient Diabetes Program Recommendations:   While on steroids: -Add Novolog 5 units tid meal coverage  Thank you, Bethena Roys E. Tanayah Squitieri, RN, MSN, CDE  Diabetes Coordinator Inpatient Glycemic Control Team Team Pager [phone] (8am-5pm) 04/16/2018 10:48 AM

## 2018-04-16 NOTE — Progress Notes (Signed)
Subjective: She says she feels better.  She says she gained 30 pounds since December.  Objective: Vital signs in last 24 hours: Temp:  [98.9 F (37.2 C)] 98.9 F (37.2 C) (02/17 2210) Pulse Rate:  [67-83] 67 (02/17 2210) Resp:  [15-25] 23 (02/17 1800) BP: (145-197)/(68-96) 145/68 (02/17 2210) SpO2:  [91 %-98 %] 97 % (02/18 0713) Weight change:  Last BM Date: 04/14/18  Intake/Output from previous day: 02/17 0701 - 02/18 0700 In: -  Out: 750 [Urine:750]  PHYSICAL EXAM General appearance: alert, cooperative and mild distress Resp: rales bilaterally Cardio: regular rate and rhythm, S1, S2 normal, no murmur, click, rub or gallop GI: soft, non-tender; bowel sounds normal; no masses,  no organomegaly Extremities: extremities normal, atraumatic, no cyanosis or edema  Lab Results:  Results for orders placed or performed during the hospital encounter of 04/15/18 (from the past 48 hour(s))  CBC with Differential     Status: Abnormal   Collection Time: 04/15/18  2:11 PM  Result Value Ref Range   WBC 6.7 4.0 - 10.5 K/uL   RBC 3.58 (L) 3.87 - 5.11 MIL/uL   Hemoglobin 9.2 (L) 12.0 - 15.0 g/dL   HCT 31.4 (L) 36.0 - 46.0 %   MCV 87.7 80.0 - 100.0 fL   MCH 25.7 (L) 26.0 - 34.0 pg   MCHC 29.3 (L) 30.0 - 36.0 g/dL   RDW 15.3 11.5 - 15.5 %   Platelets 99 (L) 150 - 400 K/uL    Comment: PLATELET COUNT CONFIRMED BY SMEAR SPECIMEN CHECKED FOR CLOTS Immature Platelet Fraction may be clinically indicated, consider ordering this additional test [id number]    nRBC 0.0 0.0 - 0.2 %   Neutrophils Relative % 90 %   Neutro Abs 6.0 1.7 - 7.7 K/uL   Lymphocytes Relative 7 %   Lymphs Abs 0.5 (L) 0.7 - 4.0 K/uL   Monocytes Relative 2 %   Monocytes Absolute 0.2 0.1 - 1.0 K/uL   Eosinophils Relative 0 %   Eosinophils Absolute 0.0 0.0 - 0.5 K/uL   Basophils Relative 0 %   Basophils Absolute 0.0 0.0 - 0.1 K/uL   Immature Granulocytes 1 %   Abs Immature Granulocytes 0.08 (H) 0.00 - 0.07 K/uL   Comment: Performed at [facility], [address], [place], [place] [address]  Basic metabolic panel     Status: Abnormal   Collection Time: 04/15/18  2:11 PM  Result Value Ref Range   Sodium 132 (L) 135 - 145 mmol/L   Potassium 4.4 3.5 - 5.1 mmol/L   Chloride 98 98 - 111 mmol/L   CO2 26 22 - 32 mmol/L   Glucose, Bld 339 (H) 70 - 99 mg/dL   BUN 23 8 - 23 mg/dL   Creatinine, Ser 1.47 (H) 0.44 - 1.00 mg/dL   Calcium 8.5 (L) 8.9 - 10.3 mg/dL   GFR calc non Af Amer 33 (L) >60 mL/min   GFR calc Af Amer 38 (L) >60 mL/min   Anion gap 8 5 - 15    Comment: Performed at [facility], [address] [place], [place] [address]  POC CBG, ED     Status: Abnormal   Collection Time: 04/15/18  2:33 PM  Result Value Ref Range   Glucose-Capillary 306 (H) 70 - 99 mg/dL  CBG monitoring, ED     Status: Abnormal   Collection Time: 04/15/18  6:02 PM  Result Value Ref Range   Glucose-Capillary 298 (H) 70 - 99 mg/dL  Glucose, capillary  Status: Abnormal   Collection Time: 04/15/18 10:10 PM  Result Value Ref Range   Glucose-Capillary 312 (H) 70 - 99 mg/dL  Basic metabolic panel     Status: Abnormal   Collection Time: 04/16/18  5:02 AM  Result Value Ref Range   Sodium 135 135 - 145 mmol/L   Potassium 4.3 3.5 - 5.1 mmol/L   Chloride 98 98 - 111 mmol/L   CO2 27 22 - 32 mmol/L   Glucose, Bld 342 (H) 70 - 99 mg/dL   BUN 32 (H) 8 - 23 mg/dL   Creatinine, Ser 1.62 (H) 0.44 - 1.00 mg/dL   Calcium 8.6 (L) 8.9 - 10.3 mg/dL   GFR calc non Af Amer 29 (L) >60 mL/min   GFR calc Af Amer 34 (L) >60 mL/min   Anion gap 10 5 - 15    Comment: Performed at [facility], [address], [place], [place] [address]  CBC     Status: Abnormal   Collection Time: 04/16/18  5:02 AM  Result Value Ref Range   WBC 5.5 4.0 - 10.5 K/uL   RBC 3.51 (L) 3.87 - 5.11 MIL/uL   Hemoglobin 9.0 (L) 12.0 - 15.0 g/dL   HCT 31.0 (L) 36.0 - 46.0 %   MCV 88.3 80.0 - 100.0 fL   MCH 25.6 (L) 26.0 - 34.0 pg   MCHC 29.0 (L) 30.0 - 36.0 g/dL    RDW 15.3 11.5 - 15.5 %   Platelets 103 (L) 150 - 400 K/uL    Comment: Immature Platelet Fraction may be clinically indicated, consider ordering this additional test [id number]    nRBC 0.0 0.0 - 0.2 %    Comment: Performed at [facility], [address] [place], [place] [address]  Glucose, capillary     Status: Abnormal   Collection Time: 04/16/18  8:02 AM  Result Value Ref Range   Glucose-Capillary 310 (H) 70 - 99 mg/dL    ABGS No results for input(s): PHART, PO2ART, TCO2, HCO3 in the last 72 hours.  Invalid input(s): PCO2 CULTURES No results found for this or any previous visit (from the past 240 hour(s)). Studies/Results: Dg Chest 2 View  Result Date: 04/15/2018 CLINICAL DATA:  Shortness of breath/hypoxia. EXAM: CHEST - 2 VIEW COMPARISON:  08/17/2017 and chest CT 04/11/2018 FINDINGS: Lungs are somewhat hypoinflated demonstrate hazy prominence of the central bronchovascular markings without focal lobar consolidation or effusion. Cardiomediastinal silhouette and remainder of the exam is unchanged. IMPRESSION: Hypoinflation with hazy prominence of the bilateral central bronchovascular markings which may be due to mild interstitial edema versus acute bronchitic process. Electronically Signed   By: Marin Olp M.D.   On: 04/15/2018 09:50    Medications:  Prior to Admission:  Medications Prior to Admission  Medication Sig Dispense Refill Last Dose  . ALPHAGAN P 0.1 % SOLN Place 1 drop into both eyes 2 (two) times daily.    04/14/2018 at Unknown time  . amLODipine (NORVASC) 5 MG tablet Take 5 mg by mouth every morning.    04/14/2018 at Unknown time  . anastrozole (ARIMIDEX) 1 MG tablet Take 1 mg by mouth daily.   04/14/2018 at Unknown time  . aspirin EC 81 MG tablet Take 81 mg by mouth daily.   04/14/2018 at Unknown time  . atorvastatin (LIPITOR) 10 MG tablet Take 10 mg by mouth every evening.    04/14/2018 at Unknown time  . Bacillus Coagulans-Inulin (PROBIOTIC FORMULA PO) Take 1  capsule by mouth daily.  04/14/2018 at Unknown time  . cyanocobalamin (,VITAMIN B-12,) 1000 MCG/ML injection Inject 1,000 mcg into the muscle every 30 (thirty) days.    04/14/2018 at Unknown time  . dorzolamide (TRUSOPT) 2 % ophthalmic solution Place 1 drop into both eyes 2 (two) times daily.   4 04/14/2018 at Unknown time  . EPIPEN 2-PAK 0.3 MG/0.3ML SOAJ injection Inject 0.3 mg into the muscle as needed for anaphylaxis.    unknown  . fulvestrant (FASLODEX) 250 MG/5ML injection Inject 500 mg into the muscle every 14 (fourteen) days. One injection each buttock over 1-2 minutes. Warm prior to use.   04/12/2018  . gabapentin (NEURONTIN) 600 MG tablet Take 600 mg by mouth daily.    04/14/2018 at Unknown time  . glipiZIDE (GLUCOTROL) 5 MG tablet Take 5 mg by mouth daily before breakfast.    04/14/2018 at Unknown time  . hydrALAZINE (APRESOLINE) 25 MG tablet Take 25-50 mg by mouth See admin instructions. Take 50 mg by mouth in the morning and take 25 mg by mouth at bedtime   04/14/2018 at Unknown time  . irbesartan (AVAPRO) 150 MG tablet Take 150 mg by mouth daily.   04/14/2018 at Unknown time  . latanoprost (XALATAN) 0.005 % ophthalmic solution Place 1 drop into both eyes at bedtime.    04/14/2018 at Unknown time  . metFORMIN (GLUCOPHAGE) 1000 MG tablet Take 1,000 mg by mouth daily.    04/14/2018 at Unknown time  . metoprolol succinate (TOPROL-XL) 100 MG 24 hr tablet Take 100 mg by mouth every morning. Take with or immediately following a meal.   04/14/2018 at 0800am  . mirtazapine (REMERON) 15 MG tablet Take 15 mg by mouth at bedtime.    04/14/2018 at Unknown time  . MYRBETRIQ 50 MG TB24 tablet Take 50 mg by mouth daily.    04/14/2018 at Unknown time  . omeprazole (PRILOSEC) 20 MG capsule Take 20 mg by mouth daily.   04/14/2018 at Unknown time  . Oxycodone HCl 10 MG TABS Take 10 mg by mouth daily.    04/14/2018 at Unknown time  . Polyethyl Glycol-Propyl Glycol (SYSTANE ULTRA) 0.4-0.3 % SOLN Place 1-2 drops into both  eyes daily as needed (for itching eyes).   04/14/2018 at Unknown time  . torsemide (DEMADEX) 20 MG tablet Take 1 tablet (20 mg total) by mouth daily. 30 tablet 5 04/14/2018 at Unknown time  . TRESIBA FLEXTOUCH 100 UNIT/ML SOPN FlexTouch Pen Inject 80 Units into the skin daily.    04/14/2018 at 0800am  . palbociclib (IBRANCE) 125 MG capsule Take 1 capsule (125 mg total) by mouth daily with breakfast. Take whole with food. Take for 21 days on, 7 days off, repeat every 28 days. (Patient not taking: Reported on 04/15/2018) 21 capsule 6 Not Taking at Unknown time   Scheduled: . amLODipine  5 mg Oral q morning - 10a  . anastrozole  1 mg Oral Daily  . aspirin EC  81 mg Oral Daily  . atorvastatin  10 mg Oral QPM  . brimonidine  1 drop Both Eyes BID  . budesonide (PULMICORT) nebulizer solution  0.25 mg Nebulization BID  . dextromethorphan-guaiFENesin  1 tablet Oral BID  . dorzolamide  1 drop Both Eyes BID  . doxycycline  100 mg Oral Q12H  . furosemide  40 mg Intravenous Q12H  . gabapentin  600 mg Oral Daily  . hydrALAZINE  25 mg Oral QHS  . hydrALAZINE  50 mg Oral q morning - 10a  . insulin  aspart  0-20 Units Subcutaneous TID WC  . insulin aspart  0-5 Units Subcutaneous QHS  . insulin glargine  80 Units Subcutaneous QHS  . ipratropium-albuterol  3 mL Nebulization Q6H  . irbesartan  150 mg Oral Daily  . latanoprost  1 drop Both Eyes QHS  . methylPREDNISolone (SOLU-MEDROL) injection  40 mg Intravenous Q12H  . metoprolol succinate  100 mg Oral q morning - 10a  . mirabegron ER  50 mg Oral Daily  . mirtazapine  15 mg Oral QHS  . oxyCODONE  10 mg Oral Daily  . pantoprazole  40 mg Oral Daily  . saccharomyces boulardii  250 mg Oral Daily  . sodium chloride flush  3 mL Intravenous Q12H   Continuous: . sodium chloride     KFM:MCRFVO chloride, acetaminophen **OR** acetaminophen, ondansetron **OR** ondansetron (ZOFRAN) IV, polyvinyl alcohol, sodium chloride flush  Assesment: She was admitted with acute  hypoxic respiratory failure that I think is because of acute on chronic diastolic heart failure.  She needs further diuresis.  Bronchitis. Principal Problem:   Acute hypoxemic respiratory failure (HCC) Active Problems:   DM type 2 (diabetes mellitus, type 2) (HCC)   Hyperlipidemia   GERD (gastroesophageal reflux disease)   Essential hypertension, benign   Recurrent breast adenocarcinoma, left (HCC)   Acute on chronic diastolic CHF (congestive heart failure) ([place])    Plan: Continue current treatments.  Continue diuresis.    LOS: 1 day   [name] 04/16/2018, 9:12 AM

## 2018-04-17 LAB — BASIC METABOLIC PANEL
Anion gap: 12 (ref 5–15)
BUN: 49 mg/dL — ABNORMAL HIGH (ref 8–23)
CHLORIDE: 96 mmol/L — AB (ref 98–111)
CO2: 30 mmol/L (ref 22–32)
CREATININE: 1.69 mg/dL — AB (ref 0.44–1.00)
Calcium: 8.8 mg/dL — ABNORMAL LOW (ref 8.9–10.3)
GFR calc Af Amer: 32 mL/min — ABNORMAL LOW (ref >60)
GFR calc non Af Amer: 28 mL/min — ABNORMAL LOW (ref >60)
GLUCOSE: 250 mg/dL — AB (ref 70–99)
Potassium: 4.1 mmol/L (ref 3.5–5.1)
Sodium: 138 mmol/L (ref 135–145)

## 2018-04-17 LAB — GLUCOSE, CAPILLARY
Glucose-Capillary: 188 mg/dL — ABNORMAL HIGH (ref 70–99)
Glucose-Capillary: 306 mg/dL — ABNORMAL HIGH (ref 70–99)
Glucose-Capillary: 196 mg/dL — ABNORMAL HIGH (ref 70–99)
Glucose-Capillary: 223 mg/dL — ABNORMAL HIGH (ref 70–99)

## 2018-04-17 MED ORDER — INSULIN ASPART 100 UNIT/ML ~~LOC~~ SOLN
5.0000 [IU] | Freq: Three times a day (TID) | SUBCUTANEOUS | Status: DC
  Administered 2018-04-17 – 2018-04-23 (×15): 5 [IU] via SUBCUTANEOUS

## 2018-04-17 NOTE — Progress Notes (Signed)
Subjective: She says she feels a little better.  She has no new complaints.  She was positive for influenza A and is being treated for that.  She is still coughing up yellowish-green sputum.  She is still short of breath but not as bad.  She feels weak.  Her blood sugar has been up.  Objective: Vital signs in last 24 hours: Temp:  [98.1 F (36.7 C)-98.4 F (36.9 C)] 98.4 F (36.9 C) (02/19 0640) Pulse Rate:  [60-64] 60 (02/19 0640) Resp:  [16-20] 20 (02/19 0640) BP: (129-167)/(64-70) 167/69 (02/19 0640) SpO2:  [94 %-100 %] 100 % (02/19 0759) Weight:  [93.3 kg] 93.3 kg (02/19 0500) Weight change: 2.581 kg Last BM Date: 04/14/18  Intake/Output from previous day: 02/18 0701 - 02/19 0700 In: 963 [P.O.:960; I.V.:3] Out: 240 [Urine:240]  PHYSICAL EXAM General appearance: alert, cooperative and no distress Resp: rhonchi bilaterally Cardio: regular rate and rhythm, S1, S2 normal, no murmur, click, rub or gallop GI: soft, non-tender; bowel sounds normal; no masses,  no organomegaly Extremities: 1+ edema  Lab Results:  Results for orders placed or performed during the hospital encounter of 04/15/18 (from the past 48 hour(s))  CBC with Differential     Status: Abnormal   Collection Time: 04/15/18  2:11 PM  Result Value Ref Range   WBC 6.7 4.0 - 10.5 K/uL   RBC 3.58 (L) 3.87 - 5.11 MIL/uL   Hemoglobin 9.2 (L) 12.0 - 15.0 g/dL   HCT 31.4 (L) 36.0 - 46.0 %   MCV 87.7 80.0 - 100.0 fL   MCH 25.7 (L) 26.0 - 34.0 pg   MCHC 29.3 (L) 30.0 - 36.0 g/dL   RDW 15.3 11.5 - 15.5 %   Platelets 99 (L) 150 - 400 K/uL    Comment: PLATELET COUNT CONFIRMED BY SMEAR SPECIMEN CHECKED FOR CLOTS Immature Platelet Fraction may be clinically indicated, consider ordering this additional test [id number]    nRBC 0.0 0.0 - 0.2 %   Neutrophils Relative % 90 %   Neutro Abs 6.0 1.7 - 7.7 K/uL   Lymphocytes Relative 7 %   Lymphs Abs 0.5 (L) 0.7 - 4.0 K/uL   Monocytes Relative 2 %   Monocytes Absolute 0.2  0.1 - 1.0 K/uL   Eosinophils Relative 0 %   Eosinophils Absolute 0.0 0.0 - 0.5 K/uL   Basophils Relative 0 %   Basophils Absolute 0.0 0.0 - 0.1 K/uL   Immature Granulocytes 1 %   Abs Immature Granulocytes 0.08 (H) 0.00 - 0.07 K/uL    Comment: Performed at [facility], [address] [place], [place] [address]  Basic metabolic panel     Status: Abnormal   Collection Time: 04/15/18  2:11 PM  Result Value Ref Range   Sodium 132 (L) 135 - 145 mmol/L   Potassium 4.4 3.5 - 5.1 mmol/L   Chloride 98 98 - 111 mmol/L   CO2 26 22 - 32 mmol/L   Glucose, Bld 339 (H) 70 - 99 mg/dL   BUN 23 8 - 23 mg/dL   Creatinine, Ser 1.47 (H) 0.44 - 1.00 mg/dL   Calcium 8.5 (L) 8.9 - 10.3 mg/dL   GFR calc non Af Amer 33 (L) >60 mL/min   GFR calc Af Amer 38 (L) >60 mL/min   Anion gap 8 5 - 15    Comment: Performed at [facility], [address], [place], [place] [address]  POC CBG, ED     Status: Abnormal   Collection Time: 04/15/18  2:33 PM  Result Value Ref Range   Glucose-Capillary 306 (H) 70 - 99 mg/dL  CBG monitoring, ED     Status: Abnormal   Collection Time: 04/15/18  6:02 PM  Result Value Ref Range   Glucose-Capillary 298 (H) 70 - 99 mg/dL  Glucose, capillary     Status: Abnormal   Collection Time: 04/15/18 10:10 PM  Result Value Ref Range   Glucose-Capillary 312 (H) 70 - 99 mg/dL  Basic metabolic panel     Status: Abnormal   Collection Time: 04/16/18  5:02 AM  Result Value Ref Range   Sodium 135 135 - 145 mmol/L   Potassium 4.3 3.5 - 5.1 mmol/L   Chloride 98 98 - 111 mmol/L   CO2 27 22 - 32 mmol/L   Glucose, Bld 342 (H) 70 - 99 mg/dL   BUN 32 (H) 8 - 23 mg/dL   Creatinine, Ser 1.62 (H) 0.44 - 1.00 mg/dL   Calcium 8.6 (L) 8.9 - 10.3 mg/dL   GFR calc non Af Amer 29 (L) >60 mL/min   GFR calc Af Amer 34 (L) >60 mL/min   Anion gap 10 5 - 15    Comment: Performed at [facility], [address], [place], [place] [address]  CBC     Status: Abnormal   Collection Time: 04/16/18  5:02 AM   Result Value Ref Range   WBC 5.5 4.0 - 10.5 K/uL   RBC 3.51 (L) 3.87 - 5.11 MIL/uL   Hemoglobin 9.0 (L) 12.0 - 15.0 g/dL   HCT 31.0 (L) 36.0 - 46.0 %   MCV 88.3 80.0 - 100.0 fL   MCH 25.6 (L) 26.0 - 34.0 pg   MCHC 29.0 (L) 30.0 - 36.0 g/dL   RDW 15.3 11.5 - 15.5 %   Platelets 103 (L) 150 - 400 K/uL    Comment: Immature Platelet Fraction may be clinically indicated, consider ordering this additional test [id number]    nRBC 0.0 0.0 - 0.2 %    Comment: Performed at [facility], [address], [place], [place] [address]  Glucose, capillary     Status: Abnormal   Collection Time: 04/16/18  8:02 AM  Result Value Ref Range   Glucose-Capillary 310 (H) 70 - 99 mg/dL  MRSA PCR Screening     Status: Abnormal   Collection Time: 04/16/18  8:41 AM  Result Value Ref Range   MRSA by PCR POSITIVE (A) NEGATIVE    Comment:        The GeneXpert MRSA Assay (FDA approved for NASAL specimens only), is one component of a comprehensive MRSA colonization surveillance program. It is not intended to diagnose MRSA infection nor to guide or monitor treatment for MRSA infections. RESULT CALLED TO, READ BACK BY AND VERIFIED WITH: MARTIN D. AT 1216P ON [id number] BY THOMPSON S. Performed at [facility], [address], [place], [place] [address]   Influenza panel by PCR (type A & B)     Status: Abnormal   Collection Time: 04/16/18 10:40 AM  Result Value Ref Range   Influenza A By PCR POSITIVE (A) NEGATIVE   Influenza B By PCR NEGATIVE NEGATIVE    Comment: (NOTE) The Xpert Xpress Flu assay is intended as an aid in the diagnosis of  influenza and should not be used as a sole basis for treatment.  This  assay is FDA approved for nasopharyngeal swab specimens only. Nasal  washings and aspirates are unacceptable for Xpert Xpress Flu testing. Performed at [facility], [address] [place], [place] [address]   Glucose, capillary     Status: Abnormal   Collection Time: 04/16/18 11:32 AM  Result Value Ref  Range   Glucose-Capillary 379 (H) 70 - 99 mg/dL  Glucose, capillary     Status: Abnormal   Collection Time: 04/16/18  4:03 PM  Result Value Ref Range   Glucose-Capillary 224 (H) 70 - 99 mg/dL   Comment 1 Notify RN    Comment 2 Document in Chart   Glucose, capillary     Status: Abnormal   Collection Time: 04/16/18  8:14 PM  Result Value Ref Range   Glucose-Capillary 257 (H) 70 - 99 mg/dL  Glucose, capillary     Status: Abnormal   Collection Time: 04/16/18  9:28 PM  Result Value Ref Range   Glucose-Capillary 231 (H) 70 - 99 mg/dL  Glucose, capillary     Status: Abnormal   Collection Time: 04/17/18  8:14 AM  Result Value Ref Range   Glucose-Capillary 188 (H) 70 - 99 mg/dL    ABGS No results for input(s): PHART, PO2ART, TCO2, HCO3 in the last 72 hours.  Invalid input(s): PCO2 CULTURES Recent Results (from the past 240 hour(s))  MRSA PCR Screening     Status: Abnormal   Collection Time: 04/16/18  8:41 AM  Result Value Ref Range Status   MRSA by PCR POSITIVE (A) NEGATIVE Final    Comment:        The GeneXpert MRSA Assay (FDA approved for NASAL specimens only), is one component of a comprehensive MRSA colonization surveillance program. It is not intended to diagnose MRSA infection nor to guide or monitor treatment for MRSA infections. RESULT CALLED TO, READ BACK BY AND VERIFIED WITH: MARTIN D. AT 1216P ON [id number] BY THOMPSON S. Performed at [facility], [address], [place], [place] [address]    Studies/Results: Dg Chest 2 View  Result Date: 04/15/2018 CLINICAL DATA:  Shortness of breath/hypoxia. EXAM: CHEST - 2 VIEW COMPARISON:  08/17/2017 and chest CT 04/11/2018 FINDINGS: Lungs are somewhat hypoinflated demonstrate hazy prominence of the central bronchovascular markings without focal lobar consolidation or effusion. Cardiomediastinal silhouette and remainder of the exam is unchanged. IMPRESSION: Hypoinflation with hazy prominence of the bilateral central bronchovascular  markings which may be due to mild interstitial edema versus acute bronchitic process. Electronically Signed   By: Marin Olp M.D.   On: 04/15/2018 09:50    Medications:  Prior to Admission:  Medications Prior to Admission  Medication Sig Dispense Refill Last Dose  . ALPHAGAN P 0.1 % SOLN Place 1 drop into both eyes 2 (two) times daily.    04/14/2018 at Unknown time  . amLODipine (NORVASC) 5 MG tablet Take 5 mg by mouth every morning.    04/14/2018 at Unknown time  . anastrozole (ARIMIDEX) 1 MG tablet Take 1 mg by mouth daily.   04/14/2018 at Unknown time  . aspirin EC 81 MG tablet Take 81 mg by mouth daily.   04/14/2018 at Unknown time  . atorvastatin (LIPITOR) 10 MG tablet Take 10 mg by mouth every evening.    04/14/2018 at Unknown time  . Bacillus Coagulans-Inulin (PROBIOTIC FORMULA PO) Take 1 capsule by mouth daily.   04/14/2018 at Unknown time  . cyanocobalamin (,VITAMIN B-12,) 1000 MCG/ML injection Inject 1,000 mcg into the muscle every 30 (thirty) days.    04/14/2018 at Unknown time  . dorzolamide (TRUSOPT) 2 % ophthalmic solution Place 1 drop into both eyes 2 (two) times daily.  4 04/14/2018 at Unknown time  . EPIPEN 2-PAK 0.3 MG/0.3ML SOAJ injection Inject 0.3 mg into the muscle as needed for anaphylaxis.    unknown  . fulvestrant (FASLODEX) 250 MG/5ML injection Inject 500 mg into the muscle every 14 (fourteen) days. One injection each buttock over 1-2 minutes. Warm prior to use.   04/12/2018  . gabapentin (NEURONTIN) 600 MG tablet Take 600 mg by mouth daily.    04/14/2018 at Unknown time  . glipiZIDE (GLUCOTROL) 5 MG tablet Take 5 mg by mouth daily before breakfast.    04/14/2018 at Unknown time  . hydrALAZINE (APRESOLINE) 25 MG tablet Take 25-50 mg by mouth See admin instructions. Take 50 mg by mouth in the morning and take 25 mg by mouth at bedtime   04/14/2018 at Unknown time  . irbesartan (AVAPRO) 150 MG tablet Take 150 mg by mouth daily.   04/14/2018 at Unknown time  . latanoprost (XALATAN)  0.005 % ophthalmic solution Place 1 drop into both eyes at bedtime.    04/14/2018 at Unknown time  . metFORMIN (GLUCOPHAGE) 1000 MG tablet Take 1,000 mg by mouth daily.    04/14/2018 at Unknown time  . metoprolol succinate (TOPROL-XL) 100 MG 24 hr tablet Take 100 mg by mouth every morning. Take with or immediately following a meal.   04/14/2018 at 0800am  . mirtazapine (REMERON) 15 MG tablet Take 15 mg by mouth at bedtime.    04/14/2018 at Unknown time  . MYRBETRIQ 50 MG TB24 tablet Take 50 mg by mouth daily.    04/14/2018 at Unknown time  . omeprazole (PRILOSEC) 20 MG capsule Take 20 mg by mouth daily.   04/14/2018 at Unknown time  . Oxycodone HCl 10 MG TABS Take 10 mg by mouth daily.    04/14/2018 at Unknown time  . Polyethyl Glycol-Propyl Glycol (SYSTANE ULTRA) 0.4-0.3 % SOLN Place 1-2 drops into both eyes daily as needed (for itching eyes).   04/14/2018 at Unknown time  . torsemide (DEMADEX) 20 MG tablet Take 1 tablet (20 mg total) by mouth daily. 30 tablet 5 04/14/2018 at Unknown time  . TRESIBA FLEXTOUCH 100 UNIT/ML SOPN FlexTouch Pen Inject 80 Units into the skin daily.    04/14/2018 at 0800am  . palbociclib (IBRANCE) 125 MG capsule Take 1 capsule (125 mg total) by mouth daily with breakfast. Take whole with food. Take for 21 days on, 7 days off, repeat every 28 days. (Patient not taking: Reported on 04/15/2018) 21 capsule 6 Not Taking at Unknown time   Scheduled: . amLODipine  5 mg Oral q morning - 10a  . anastrozole  1 mg Oral Daily  . aspirin EC  81 mg Oral Daily  . atorvastatin  10 mg Oral QPM  . brimonidine  1 drop Both Eyes BID  . budesonide (PULMICORT) nebulizer solution  0.25 mg Nebulization BID  . dextromethorphan-guaiFENesin  1 tablet Oral BID  . docusate sodium  100 mg Oral BID  . dorzolamide  1 drop Both Eyes BID  . doxycycline  100 mg Oral Q12H  . furosemide  40 mg Intravenous Q12H  . gabapentin  600 mg Oral Daily  . hydrALAZINE  25 mg Oral QHS  . hydrALAZINE  50 mg Oral q morning  - 10a  . insulin aspart  0-20 Units Subcutaneous TID WC  . insulin aspart  0-5 Units Subcutaneous QHS  . insulin aspart  5 Units Subcutaneous TID WC  . insulin glargine  80 Units Subcutaneous QHS  . ipratropium-albuterol  3 mL Nebulization Q6H  . irbesartan  150 mg Oral Daily  . latanoprost  1 drop Both Eyes QHS  . methylPREDNISolone (SOLU-MEDROL) injection  40 mg Intravenous Q12H  . metoprolol succinate  100 mg Oral q morning - 10a  . mirabegron ER  50 mg Oral Daily  . mirtazapine  15 mg Oral QHS  . oseltamivir  30 mg Oral Daily  . oxyCODONE  10 mg Oral Daily  . pantoprazole  40 mg Oral Daily  . saccharomyces boulardii  250 mg Oral Daily  . sodium chloride flush  3 mL Intravenous Q12H   Continuous: . sodium chloride     MMC:RFVOHK chloride, acetaminophen **OR** acetaminophen, ondansetron **OR** ondansetron (ZOFRAN) IV, polyvinyl alcohol, sodium chloride flush  Assesment: She was admitted with acute hypoxic respiratory failure which is likely multifactorial.  She has influenza A and appears to have some bacterial acute bronchitis as well.  Additionally she has acute on chronic diastolic heart failure and says she is gained about 20 to 25 pounds since Christmas.  As far as her heart failure is concerned she is on Lasix.  Her INO is incomplete and she has not been weighed yet this morning.  She does look better.  She still has symptoms of bronchitis and will remain on medication  She has influenza A and is being treated  She has had recent recurrence of breast cancer which is stable  She has diabetes which is not controlled and I am going to add 5 units of NovoLog with meals Principal Problem:   Acute hypoxemic respiratory failure ([place]) Active Problems:   DM type 2 (diabetes mellitus, type 2) ([place])   Hyperlipidemia   GERD (gastroesophageal reflux disease)   Essential hypertension, benign   Recurrent breast adenocarcinoma, left (HCC)   Acute on chronic diastolic CHF (congestive  heart failure) ([place])    Plan: Continue current treatments.  Continue diuresis.    LOS: 2 days   [name] 04/17/2018, 8:46 AM

## 2018-04-17 NOTE — Plan of Care (Signed)
  Problem: Acute Rehab PT Goals(only PT should resolve) Goal: Pt Will Go Supine/Side To Sit Outcome: Progressing Flowsheets (Taken 04/17/2018 1356) Pt will go Supine/Side to Sit: with modified independence Goal: Patient Will Transfer Sit To/From Stand Outcome: Progressing Flowsheets (Taken 04/17/2018 1356) Patient will transfer sit to/from stand: with modified independence Goal: Pt Will Transfer Bed To Chair/Chair To Bed Outcome: Progressing Flowsheets (Taken 04/17/2018 1356) Pt will Transfer Bed to Chair/Chair to Bed: with modified independence Goal: Pt Will Ambulate Outcome: Progressing Flowsheets (Taken 04/17/2018 1356) Pt will Ambulate: 50 feet; with supervision; with rolling walker   1:57 PM, 04/17/18 Lonell Grandchild, MPT Physical Therapist with [facility] [phone] office [phone] mobile phone

## 2018-04-17 NOTE — Care Management Note (Signed)
Case Management Note  Patient Details  Name: [name] MRN: [id number] Date of Birth: 1933-09-21  Subjective/Objective:      CHF. Bronchitis. Flu. From home alone, daughter lives 7 doors down and will be available when discharged home. Walks with a rollator. Recommended for home health PT. Agreeable. Has had [facility] in the past and elects again.               Action/Plan: Vaughan Basta of [facility] notified and will obtain orders via Epic. She asks about a lighter weight walker. Daughter will bring her current walker for [place] to assess and see if there are lighter options available.   Expected Discharge Date:       unk          Expected Discharge Plan:  [place]  In-House Referral:     Discharge planning Services  CM Consult  Post Acute Care Choice:  Home Health Choice offered to:  Patient  DME Arranged:    DME Agency:     HH Arranged:  PT [place]:  [place]  Status of Service:  Completed, signed off  If discussed at [place] of Stay Meetings, dates discussed:    Additional Comments:  [name], Chauncey Reading, RN 04/17/2018, 2:34 PM

## 2018-04-17 NOTE — Evaluation (Signed)
Physical Therapy Evaluation Patient Details Name: [name] MRN: [id number] DOB: 08/08/1933 Today's Date: 04/17/2018   History of Present Illness  [name] is a 85 y.o. female with medical history significant for type 2 diabetes, hypertension, GERD, dyslipidemia, and recurrent breast cancer stage III that she was recently rediagnosed with.  She presents to the ED with worsening shortness of breath along with dry cough over the last 2 days and received nebulizer treatment and IV steroids in route via EMS.  She has been given a nebulized treatment as well as some IV Lasix.  She is noted to have some bilateral lower extremity edema and has not been checking her weight.  She denies any orthopnea, chest pain, fever, or chills.  She has been taking her other medications as usual.  Daughter at bedside does state that patient has been in and out of hospitals quite frequently recently due to the recent cancer diagnosis.    Clinical Impression  Patient functioning near baseline for functional mobility and gait, able to transfer to [facility] using armrest of chair and BSC, slightly labored functional movement, has to lean on RW with elbows due to chronic low back pain per patient, on room air with O2 saturation remaining between 91-94% during ambulation and after sitting up in chair after gait training, patient left on room air - RN notified.  Patient will benefit from continued physical therapy in hospital and recommended venue below to increase strength, balance, endurance for safe ADLs and gait.    Follow Up Recommendations Home health PT;Supervision - Intermittent    Equipment Recommendations  None recommended by PT    Recommendations for Other Services       Precautions / Restrictions Precautions Precautions: Fall Restrictions Weight Bearing Restrictions: No      Mobility  Bed Mobility Overal bed mobility: Needs Assistance Bed Mobility: Supine to Sit     Supine to sit:  Supervision     General bed mobility comments: slow labored movement  Transfers Overall transfer level: Needs assistance Equipment used: Rolling walker (2 wheeled) Transfers: Sit to/from [employer] Transfers Sit to Stand: Supervision Stand pivot transfers: Supervision;Min guard       General transfer comment: slightly labored movement  Ambulation/Gait Ambulation/Gait assistance: Min guard Gait Distance (Feet): 35 Feet Assistive device: Rolling walker (2 wheeled) Gait Pattern/deviations: Decreased step length - right;Decreased step length - left;Decreased stride length Gait velocity: decreased   General Gait Details: slightly labored slow cadence without loss of balance, has to lean with elbows on RW which is baseline for patient, limited secondary to fatigue  Stairs            Wheelchair Mobility    Modified Rankin (Stroke Patients Only)       Balance Overall balance assessment: Needs assistance Sitting-balance support: Feet supported;No upper extremity supported Sitting balance-Leahy Scale: Good     Standing balance support: Bilateral upper extremity supported;During functional activity Standing balance-Leahy Scale: Fair Standing balance comment: using RW                             Pertinent Vitals/Pain Pain Assessment: No/denies pain    Home Living Family/patient expects to be discharged to:: Private residence Living Arrangements: Alone Available Help at Discharge: Family Type of Home: House Home Access: Stairs to enter Entrance Stairs-Rails: Right Entrance Stairs-Number of Steps: 2 Home Layout: One level Home Equipment: Cane - single point;Shower seat;Hand held Tourist information centre manager - 4  wheels      Prior Function Level of Independence: Needs assistance   Gait / Transfers Assistance Needed: household ambulator with Rollator  ADL's / Homemaking Assistance Needed: assisted by daughter        Hand Dominance   Dominant Hand:  Right    Extremity/Trunk Assessment   Upper Extremity Assessment Upper Extremity Assessment: Generalized weakness    Lower Extremity Assessment Lower Extremity Assessment: Generalized weakness    Cervical / Trunk Assessment Cervical / Trunk Assessment: Kyphotic  Communication   Communication: No difficulties  Cognition Arousal/Alertness: Awake/alert Behavior During Therapy: WFL for tasks assessed/performed Overall Cognitive Status: Within Functional Limits for tasks assessed                                        General Comments      Exercises     Assessment/Plan    PT Assessment Patient needs continued PT services  PT Problem List Decreased strength;Decreased activity tolerance;Decreased balance;Decreased mobility       PT Treatment Interventions Gait training;Stair training;Functional mobility training;Therapeutic activities;Patient/family education;Therapeutic exercise    PT Goals (Current goals can be found in the Care Plan section)  Acute Rehab PT Goals Patient Stated Goal: return home with family to assist PT Goal Formulation: With patient Time For Goal Achievement: 04/24/18 Potential to Achieve Goals: Good    Frequency Min 3X/week   Barriers to discharge        Co-evaluation               AM-PAC PT "6 Clicks" Mobility  Outcome Measure Help needed turning from your back to your side while in a flat bed without using bedrails?: None Help needed moving from lying on your back to sitting on the side of a flat bed without using bedrails?: A Little(had to use bedrail) Help needed moving to and from a bed to a chair (including a wheelchair)?: A Little Help needed standing up from a chair using your arms (e.g., wheelchair or bedside chair)?: A Little Help needed to walk in hospital room?: A Little Help needed climbing 3-5 steps with a railing? : A Lot 6 Click Score: 18    End of Session   Activity Tolerance: Patient tolerated  treatment well;Patient limited by fatigue Patient left: in chair;with call bell/phone within reach Nurse Communication: Mobility status PT Visit Diagnosis: Unsteadiness on feet (R26.81);Other abnormalities of gait and mobility (R26.89);Muscle weakness (generalized) (M62.81)    Time: 2841-3244 PT Time Calculation (min) (ACUTE ONLY): 32 min   Charges:   PT Evaluation $PT Eval Moderate Complexity: 1 Mod PT Treatments $Therapeutic Activity: 23-37 mins        1:55 PM, 04/17/18 Lonell Grandchild, MPT Physical Therapist with [facility] [phone] office [phone] mobile phone

## 2018-04-18 LAB — CBC WITH DIFFERENTIAL/PLATELET
Abs Immature Granulocytes: 0.03 10*3/uL (ref 0.00–0.07)
BASOS ABS: 0 10*3/uL (ref 0.0–0.1)
Basophils Relative: 0 %
EOS PCT: 0 %
Eosinophils Absolute: 0 10*3/uL (ref 0.0–0.5)
HCT: 34.6 % — ABNORMAL LOW (ref 36.0–46.0)
Hemoglobin: 10.1 g/dL — ABNORMAL LOW (ref 12.0–15.0)
Immature Granulocytes: 0 %
Lymphocytes Relative: 17 %
Lymphs Abs: 1.2 10*3/uL (ref 0.7–4.0)
MCH: 26.5 pg (ref 26.0–34.0)
MCHC: 29.2 g/dL — AB (ref 30.0–36.0)
MCV: 90.8 fL (ref 80.0–100.0)
Monocytes Absolute: 0.7 10*3/uL (ref 0.1–1.0)
Monocytes Relative: 9 %
Neutro Abs: 5.2 10*3/uL (ref 1.7–7.7)
Neutrophils Relative %: 74 %
Platelets: 146 10*3/uL — ABNORMAL LOW (ref 150–400)
RBC: 3.81 MIL/uL — ABNORMAL LOW (ref 3.87–5.11)
RDW: 15.3 % (ref 11.5–15.5)
WBC: 7.1 10*3/uL (ref 4.0–10.5)
nRBC: 0 % (ref 0.0–0.2)

## 2018-04-18 LAB — BASIC METABOLIC PANEL
Anion gap: 11 (ref 5–15)
BUN: 59 mg/dL — ABNORMAL HIGH (ref 8–23)
CO2: 33 mmol/L — ABNORMAL HIGH (ref 22–32)
Calcium: 8.7 mg/dL — ABNORMAL LOW (ref 8.9–10.3)
Chloride: 95 mmol/L — ABNORMAL LOW (ref 98–111)
Creatinine, Ser: 1.75 mg/dL — ABNORMAL HIGH (ref 0.44–1.00)
GFR calc Af Amer: 31 mL/min — ABNORMAL LOW (ref >60)
GFR calc non Af Amer: 27 mL/min — ABNORMAL LOW (ref >60)
Glucose, Bld: 144 mg/dL — ABNORMAL HIGH (ref 70–99)
Potassium: 3.7 mmol/L (ref 3.5–5.1)
Sodium: 139 mmol/L (ref 135–145)

## 2018-04-18 LAB — GLUCOSE, CAPILLARY
GLUCOSE-CAPILLARY: 235 mg/dL — AB (ref 70–99)
Glucose-Capillary: 97 mg/dL (ref 70–99)
Glucose-Capillary: 190 mg/dL — ABNORMAL HIGH (ref 70–99)
Glucose-Capillary: 155 mg/dL — ABNORMAL HIGH (ref 70–99)

## 2018-04-18 MED ORDER — CHLORHEXIDINE GLUCONATE CLOTH 2 % EX PADS
6.0000 | MEDICATED_PAD | Freq: Every day | CUTANEOUS | Status: AC
  Administered 2018-04-18 – 2018-04-22 (×4): 6 via TOPICAL

## 2018-04-18 MED ORDER — MUPIROCIN 2 % EX OINT
1.0000 "application " | TOPICAL_OINTMENT | Freq: Two times a day (BID) | CUTANEOUS | Status: AC
  Administered 2018-04-18 – 2018-04-22 (×10): 1 via NASAL
  Filled 2018-04-18: qty 22

## 2018-04-18 MED ORDER — GUAIFENESIN-DM 100-10 MG/5ML PO SYRP
5.0000 mL | ORAL_SOLUTION | ORAL | Status: DC | PRN
  Administered 2018-04-18 – 2018-04-22 (×8): 5 mL via ORAL
  Filled 2018-04-18 (×9): qty 5

## 2018-04-18 NOTE — Progress Notes (Signed)
Subjective: She says she feels better.  She is still wheezing.  She is still coughing up sputum.  She has no other new complaints.  Objective: Vital signs in last 24 hours: Temp:  [98 F (36.7 C)-98.4 F (36.9 C)] 98.4 F (36.9 C) (02/19 2209) Pulse Rate:  [54-65] 65 (02/19 2209) Resp:  [16-18] 16 (02/19 2209) BP: (131-152)/(63-71) 152/71 (02/19 2209) SpO2:  [91 %-96 %] 96 % (02/20 0756) Weight change:  Last BM Date: 04/14/18  Intake/Output from previous day: 02/19 0701 - 02/20 0700 In: 723 [P.O.:720; I.V.:3] Out: 3450 [Urine:3450]  PHYSICAL EXAM General appearance: alert, cooperative and mild distress Resp: rhonchi bilaterally Cardio: regular rate and rhythm, S1, S2 normal, no murmur, click, rub or gallop GI: soft, non-tender; bowel sounds normal; no masses,  no organomegaly Extremities: Still some edema but it is less  Lab Results:  Results for orders placed or performed during the hospital encounter of 04/15/18 (from the past 48 hour(s))  Influenza panel by PCR (type A & B)     Status: Abnormal   Collection Time: 04/16/18 10:40 AM  Result Value Ref Range   Influenza A By PCR POSITIVE (A) NEGATIVE   Influenza B By PCR NEGATIVE NEGATIVE    Comment: (NOTE) The Xpert Xpress Flu assay is intended as an aid in the diagnosis of  influenza and should not be used as a sole basis for treatment.  This  assay is FDA approved for nasopharyngeal swab specimens only. Nasal  washings and aspirates are unacceptable for Xpert Xpress Flu testing. Performed at [facility], [address], [place], [place] [address]   Glucose, capillary     Status: Abnormal   Collection Time: 04/16/18 11:32 AM  Result Value Ref Range   Glucose-Capillary 379 (H) 70 - 99 mg/dL  Glucose, capillary     Status: Abnormal   Collection Time: 04/16/18  4:03 PM  Result Value Ref Range   Glucose-Capillary 224 (H) 70 - 99 mg/dL   Comment 1 Notify RN    Comment 2 Document in Chart   Glucose, capillary      Status: Abnormal   Collection Time: 04/16/18  8:14 PM  Result Value Ref Range   Glucose-Capillary 257 (H) 70 - 99 mg/dL  Glucose, capillary     Status: Abnormal   Collection Time: 04/16/18  9:28 PM  Result Value Ref Range   Glucose-Capillary 231 (H) 70 - 99 mg/dL  Glucose, capillary     Status: Abnormal   Collection Time: 04/17/18  8:14 AM  Result Value Ref Range   Glucose-Capillary 188 (H) 70 - 99 mg/dL  Basic metabolic panel     Status: Abnormal   Collection Time: 04/17/18  9:18 AM  Result Value Ref Range   Sodium 138 135 - 145 mmol/L   Potassium 4.1 3.5 - 5.1 mmol/L   Chloride 96 (L) 98 - 111 mmol/L   CO2 30 22 - 32 mmol/L   Glucose, Bld 250 (H) 70 - 99 mg/dL   BUN 49 (H) 8 - 23 mg/dL   Creatinine, Ser 1.69 (H) 0.44 - 1.00 mg/dL   Calcium 8.8 (L) 8.9 - 10.3 mg/dL   GFR calc non Af Amer 28 (L) >60 mL/min   GFR calc Af Amer 32 (L) >60 mL/min   Anion gap 12 5 - 15    Comment: Performed at [facility], [address] [place], [place] [address]  Glucose, capillary     Status: Abnormal   Collection Time: 04/17/18  10:58 AM  Result Value Ref Range   Glucose-Capillary 306 (H) 70 - 99 mg/dL  Glucose, capillary     Status: Abnormal   Collection Time: 04/17/18  4:52 PM  Result Value Ref Range   Glucose-Capillary 196 (H) 70 - 99 mg/dL   Comment 1 Notify RN    Comment 2 Document in Chart   Glucose, capillary     Status: Abnormal   Collection Time: 04/17/18 10:05 PM  Result Value Ref Range   Glucose-Capillary 223 (H) 70 - 99 mg/dL   Comment 1 Notify RN    Comment 2 Document in Chart   Basic metabolic panel     Status: Abnormal   Collection Time: 04/18/18  4:51 AM  Result Value Ref Range   Sodium 139 135 - 145 mmol/L   Potassium 3.7 3.5 - 5.1 mmol/L   Chloride 95 (L) 98 - 111 mmol/L   CO2 33 (H) 22 - 32 mmol/L   Glucose, Bld 144 (H) 70 - 99 mg/dL   BUN 59 (H) 8 - 23 mg/dL   Creatinine, Ser 1.75 (H) 0.44 - 1.00 mg/dL   Calcium 8.7 (L) 8.9 - 10.3 mg/dL   GFR calc non Af Amer  27 (L) >60 mL/min   GFR calc Af Amer 31 (L) >60 mL/min   Anion gap 11 5 - 15    Comment: Performed at [facility], [address] [place], [place] [address]  CBC with Differential/Platelet     Status: Abnormal   Collection Time: 04/18/18  4:51 AM  Result Value Ref Range   WBC 7.1 4.0 - 10.5 K/uL   RBC 3.81 (L) 3.87 - 5.11 MIL/uL   Hemoglobin 10.1 (L) 12.0 - 15.0 g/dL   HCT 34.6 (L) 36.0 - 46.0 %   MCV 90.8 80.0 - 100.0 fL   MCH 26.5 26.0 - 34.0 pg   MCHC 29.2 (L) 30.0 - 36.0 g/dL   RDW 15.3 11.5 - 15.5 %   Platelets 146 (L) 150 - 400 K/uL   nRBC 0.0 0.0 - 0.2 %   Neutrophils Relative % 74 %   Neutro Abs 5.2 1.7 - 7.7 K/uL   Lymphocytes Relative 17 %   Lymphs Abs 1.2 0.7 - 4.0 K/uL   Monocytes Relative 9 %   Monocytes Absolute 0.7 0.1 - 1.0 K/uL   Eosinophils Relative 0 %   Eosinophils Absolute 0.0 0.0 - 0.5 K/uL   Basophils Relative 0 %   Basophils Absolute 0.0 0.0 - 0.1 K/uL   Immature Granulocytes 0 %   Abs Immature Granulocytes 0.03 0.00 - 0.07 K/uL    Comment: Performed at [facility], [address] [place], [place] [address]  Glucose, capillary     Status: None   Collection Time: 04/18/18  7:27 AM  Result Value Ref Range   Glucose-Capillary 97 70 - 99 mg/dL    ABGS No results for input(s): PHART, PO2ART, TCO2, HCO3 in the last 72 hours.  Invalid input(s): PCO2 CULTURES Recent Results (from the past 240 hour(s))  MRSA PCR Screening     Status: Abnormal   Collection Time: 04/16/18  8:41 AM  Result Value Ref Range Status   MRSA by PCR POSITIVE (A) NEGATIVE Final    Comment:        The GeneXpert MRSA Assay (FDA approved for NASAL specimens only), is one component of a comprehensive MRSA colonization surveillance program. It is not intended to diagnose MRSA infection nor to guide or monitor treatment for  MRSA infections. RESULT CALLED TO, READ BACK BY AND VERIFIED WITH: MARTIN D. AT 1216P ON [id number] BY THOMPSON S. Performed at [facility], [address] [place], [place] [address]    Studies/Results: No results found.  Medications:  Prior to Admission:  Medications Prior to Admission  Medication Sig Dispense Refill Last Dose  . ALPHAGAN P 0.1 % SOLN Place 1 drop into both eyes 2 (two) times daily.    04/14/2018 at Unknown time  . amLODipine (NORVASC) 5 MG tablet Take 5 mg by mouth every morning.    04/14/2018 at Unknown time  . anastrozole (ARIMIDEX) 1 MG tablet Take 1 mg by mouth daily.   04/14/2018 at Unknown time  . aspirin EC 81 MG tablet Take 81 mg by mouth daily.   04/14/2018 at Unknown time  . atorvastatin (LIPITOR) 10 MG tablet Take 10 mg by mouth every evening.    04/14/2018 at Unknown time  . Bacillus Coagulans-Inulin (PROBIOTIC FORMULA PO) Take 1 capsule by mouth daily.   04/14/2018 at Unknown time  . cyanocobalamin (,VITAMIN B-12,) 1000 MCG/ML injection Inject 1,000 mcg into the muscle every 30 (thirty) days.    04/14/2018 at Unknown time  . dorzolamide (TRUSOPT) 2 % ophthalmic solution Place 1 drop into both eyes 2 (two) times daily.   4 04/14/2018 at Unknown time  . EPIPEN 2-PAK 0.3 MG/0.3ML SOAJ injection Inject 0.3 mg into the muscle as needed for anaphylaxis.    unknown  . fulvestrant (FASLODEX) 250 MG/5ML injection Inject 500 mg into the muscle every 14 (fourteen) days. One injection each buttock over 1-2 minutes. Warm prior to use.   04/12/2018  . gabapentin (NEURONTIN) 600 MG tablet Take 600 mg by mouth daily.    04/14/2018 at Unknown time  . glipiZIDE (GLUCOTROL) 5 MG tablet Take 5 mg by mouth daily before breakfast.    04/14/2018 at Unknown time  . hydrALAZINE (APRESOLINE) 25 MG tablet Take 25-50 mg by mouth See admin instructions. Take 50 mg by mouth in the morning and take 25 mg by mouth at bedtime   04/14/2018 at Unknown time  . irbesartan (AVAPRO) 150 MG tablet Take 150 mg by mouth daily.   04/14/2018 at Unknown time  . latanoprost (XALATAN) 0.005 % ophthalmic solution Place 1 drop into both eyes at bedtime.    04/14/2018 at  Unknown time  . metFORMIN (GLUCOPHAGE) 1000 MG tablet Take 1,000 mg by mouth daily.    04/14/2018 at Unknown time  . metoprolol succinate (TOPROL-XL) 100 MG 24 hr tablet Take 100 mg by mouth every morning. Take with or immediately following a meal.   04/14/2018 at 0800am  . mirtazapine (REMERON) 15 MG tablet Take 15 mg by mouth at bedtime.    04/14/2018 at Unknown time  . MYRBETRIQ 50 MG TB24 tablet Take 50 mg by mouth daily.    04/14/2018 at Unknown time  . omeprazole (PRILOSEC) 20 MG capsule Take 20 mg by mouth daily.   04/14/2018 at Unknown time  . Oxycodone HCl 10 MG TABS Take 10 mg by mouth daily.    04/14/2018 at Unknown time  . Polyethyl Glycol-Propyl Glycol (SYSTANE ULTRA) 0.4-0.3 % SOLN Place 1-2 drops into both eyes daily as needed (for itching eyes).   04/14/2018 at Unknown time  . torsemide (DEMADEX) 20 MG tablet Take 1 tablet (20 mg total) by mouth daily. 30 tablet 5 04/14/2018 at Unknown time  . TRESIBA FLEXTOUCH 100 UNIT/ML SOPN FlexTouch Pen Inject 80 Units into the skin  daily.    04/14/2018 at 0800am  . palbociclib (IBRANCE) 125 MG capsule Take 1 capsule (125 mg total) by mouth daily with breakfast. Take whole with food. Take for 21 days on, 7 days off, repeat every 28 days. (Patient not taking: Reported on 04/15/2018) 21 capsule 6 Not Taking at Unknown time   Scheduled: . amLODipine  5 mg Oral q morning - 10a  . anastrozole  1 mg Oral Daily  . aspirin EC  81 mg Oral Daily  . atorvastatin  10 mg Oral QPM  . brimonidine  1 drop Both Eyes BID  . budesonide (PULMICORT) nebulizer solution  0.25 mg Nebulization BID  . Chlorhexidine Gluconate Cloth  6 each Topical Q0600  . dextromethorphan-guaiFENesin  1 tablet Oral BID  . docusate sodium  100 mg Oral BID  . dorzolamide  1 drop Both Eyes BID  . doxycycline  100 mg Oral Q12H  . furosemide  40 mg Intravenous Q12H  . gabapentin  600 mg Oral Daily  . hydrALAZINE  25 mg Oral QHS  . hydrALAZINE  50 mg Oral q morning - 10a  . insulin aspart   0-20 Units Subcutaneous TID WC  . insulin aspart  0-5 Units Subcutaneous QHS  . insulin aspart  5 Units Subcutaneous TID WC  . insulin glargine  80 Units Subcutaneous QHS  . ipratropium-albuterol  3 mL Nebulization Q6H  . irbesartan  150 mg Oral Daily  . latanoprost  1 drop Both Eyes QHS  . methylPREDNISolone (SOLU-MEDROL) injection  40 mg Intravenous Q12H  . metoprolol succinate  100 mg Oral q morning - 10a  . mirabegron ER  50 mg Oral Daily  . mirtazapine  15 mg Oral QHS  . mupirocin ointment  1 application Nasal BID  . oseltamivir  30 mg Oral Daily  . oxyCODONE  10 mg Oral Daily  . pantoprazole  40 mg Oral Daily  . saccharomyces boulardii  250 mg Oral Daily  . sodium chloride flush  3 mL Intravenous Q12H   Continuous: . sodium chloride     OFH:QRFXJO chloride, acetaminophen **OR** acetaminophen, ondansetron **OR** ondansetron (ZOFRAN) IV, polyvinyl alcohol, sodium chloride flush  Assesment: She was admitted with acute hypoxic respiratory failure and that seems to be a combination of acute bronchitis and acute on chronic diastolic heart failure.  She is improving.  She also had influenza A and she is being treated for that.  She has newly diagnosed recurrent breast cancer and is undergoing treatment for that  She has diabetes which is fairly well controlled Principal Problem:   Acute hypoxemic respiratory failure ([place]) Active Problems:   DM type 2 (diabetes mellitus, type 2) ([place])   Hyperlipidemia   GERD (gastroesophageal reflux disease)   Essential hypertension, benign   Recurrent breast adenocarcinoma, left (HCC)   Acute on chronic diastolic CHF (congestive heart failure) ([place])    Plan: Continue treatments.  Add flutter valve.  Continue diuresis.  She is not ready for discharge    LOS: 3 days   Alonza Bogus 04/18/2018, 8:52 AM

## 2018-04-18 NOTE — Progress Notes (Signed)
Physical Therapy Treatment Patient Details Name: [name] MRN: [id number] DOB: 07/24/1933 Today's Date: 04/18/2018    History of Present Illness [name] is a 85 y.o. female with medical history significant for type 2 diabetes, hypertension, GERD, dyslipidemia, and recurrent breast cancer stage III that she was recently rediagnosed with.  She presents to the ED with worsening shortness of breath along with dry cough over the last 2 days and received nebulizer treatment and IV steroids in route via EMS.  She has been given a nebulized treatment as well as some IV Lasix.  She is noted to have some bilateral lower extremity edema and has not been checking her weight.  She denies any orthopnea, chest pain, fever, or chills.  She has been taking her other medications as usual.  Daughter at bedside does state that patient has been in and out of hospitals quite frequently recently due to the recent cancer diagnosis.    PT Comments    Patient demonstrates improvement for sitting up at bedside with head of bed flat, good return for completing bed/BSC/chair transfers without loss of balance, slightly increased endurance/distance for gait training without loss of balance, but has to lean elbows onto RW and limited secondary to c/o fatigue.  Patient tolerated sitting up in chair after therapy.  Patient will benefit from continued physical therapy in hospital and recommended venue below to increase strength, balance, endurance for safe ADLs and gait.    Follow Up Recommendations  Home health PT;Supervision - Intermittent     Equipment Recommendations  Rolling walker with 5" wheels(replacement Rollator with bigger wheels if possible)    Recommendations for Other Services       Precautions / Restrictions Precautions Precautions: Fall Restrictions Weight Bearing Restrictions: No    Mobility  Bed Mobility Overal bed mobility: Modified Independent             General bed mobility  comments: increased time, use of siderail with head of bed flat  Transfers Overall transfer level: Needs assistance Equipment used: None;Rolling walker (2 wheeled) Transfers: Sit to/from [facility] Sit to Stand: Supervision Stand pivot transfers: Supervision       General transfer comment: able to complete bed/BSC/chair transfers using armrest of chair/BSC, completes sit to stands without AD, safer using RW  Ambulation/Gait Ambulation/Gait assistance: Min guard Gait Distance (Feet): 40 Feet Assistive device: Rolling walker (2 wheeled) Gait Pattern/deviations: Decreased step length - right;Decreased step length - left;Decreased stride length Gait velocity: decreased   General Gait Details: slightly labored slow cadence without loss of balance, has to lean with elbows on RW which is baseline for patient, limited secondary to fatigue, on room air with O2 saturation between 91-93%   Stairs             Wheelchair Mobility    Modified Rankin (Stroke Patients Only)       Balance Overall balance assessment: Needs assistance Sitting-balance support: Feet supported;No upper extremity supported Sitting balance-Leahy Scale: Good     Standing balance support: No upper extremity supported;During functional activity Standing balance-Leahy Scale: Fair Standing balance comment: fair/good using RW                            Cognition Arousal/Alertness: Awake/alert Behavior During Therapy: WFL for tasks assessed/performed Overall Cognitive Status: Within Functional Limits for tasks assessed  Exercises General Exercises - Lower Extremity Long Arc Quad: Seated;Strengthening;AROM;Both;10 reps Hip Flexion/Marching: Seated;Strengthening;AROM;Both;10 reps Toe Raises: Seated;Strengthening;AROM;Both;10 reps Heel Raises: Seated;Strengthening;AROM;Both;10 reps    General Comments        Pertinent  Vitals/Pain Pain Assessment: No/denies pain    Home Living                      Prior Function            PT Goals (current goals can now be found in the care plan section) Acute Rehab PT Goals Patient Stated Goal: return home with family to assist PT Goal Formulation: With patient Time For Goal Achievement: 04/24/18 Potential to Achieve Goals: Good Progress towards PT goals: Progressing toward goals    Frequency    Min 3X/week      PT Plan Current plan remains appropriate    Co-evaluation              AM-PAC PT "6 Clicks" Mobility   Outcome Measure  Help needed turning from your back to your side while in a flat bed without using bedrails?: None Help needed moving from lying on your back to sitting on the side of a flat bed without using bedrails?: None(had to side bedrail) Help needed moving to and from a bed to a chair (including a wheelchair)?: A Little Help needed standing up from a chair using your arms (e.g., wheelchair or bedside chair)?: A Little Help needed to walk in hospital room?: A Little Help needed climbing 3-5 steps with a railing? : A Lot 6 Click Score: 19    End of Session   Activity Tolerance: Patient tolerated treatment well;Patient limited by fatigue Patient left: in chair;with call bell/phone within reach Nurse Communication: Mobility status PT Visit Diagnosis: Unsteadiness on feet (R26.81);Other abnormalities of gait and mobility (R26.89);Muscle weakness (generalized) (M62.81)     Time: 8329-1916 PT Time Calculation (min) (ACUTE ONLY): 33 min  Charges:  $Gait Training: 8-22 mins $Therapeutic Exercise: 8-22 mins                     2:28 PM, 04/18/18 Lonell Grandchild, MPT Physical Therapist with [facility] [phone] office [phone] mobile phone

## 2018-04-19 LAB — BASIC METABOLIC PANEL
Anion gap: 11 (ref 5–15)
BUN: 62 mg/dL — ABNORMAL HIGH (ref 8–23)
CO2: 33 mmol/L — ABNORMAL HIGH (ref 22–32)
Calcium: 9.2 mg/dL (ref 8.9–10.3)
Chloride: 96 mmol/L — ABNORMAL LOW (ref 98–111)
Creatinine, Ser: 1.89 mg/dL — ABNORMAL HIGH (ref 0.44–1.00)
GFR calc Af Amer: 28 mL/min — ABNORMAL LOW (ref >60)
GFR calc non Af Amer: 24 mL/min — ABNORMAL LOW (ref >60)
Glucose, Bld: 139 mg/dL — ABNORMAL HIGH (ref 70–99)
Potassium: 5 mmol/L (ref 3.5–5.1)
Sodium: 140 mmol/L (ref 135–145)

## 2018-04-19 LAB — GLUCOSE, CAPILLARY
GLUCOSE-CAPILLARY: 347 mg/dL — AB (ref 70–99)
Glucose-Capillary: 122 mg/dL — ABNORMAL HIGH (ref 70–99)
Glucose-Capillary: 272 mg/dL — ABNORMAL HIGH (ref 70–99)
Glucose-Capillary: 128 mg/dL — ABNORMAL HIGH (ref 70–99)

## 2018-04-19 MED ORDER — IPRATROPIUM-ALBUTEROL 0.5-2.5 (3) MG/3ML IN SOLN
3.0000 mL | Freq: Three times a day (TID) | RESPIRATORY_TRACT | Status: DC
  Administered 2018-04-20 – 2018-04-23 (×10): 3 mL via RESPIRATORY_TRACT
  Filled 2018-04-19 (×10): qty 3

## 2018-04-19 MED ORDER — ALBUTEROL SULFATE (2.5 MG/3ML) 0.083% IN NEBU: 2.5000 mg | INHALATION_SOLUTION | Freq: Four times a day (QID) | RESPIRATORY_TRACT | Status: DC | PRN

## 2018-04-19 MED ORDER — TRAMADOL HCL 50 MG PO TABS
50.0000 mg | ORAL_TABLET | Freq: Two times a day (BID) | ORAL | Status: DC | PRN
  Administered 2018-04-19 – 2018-04-21 (×3): 50 mg via ORAL
  Filled 2018-04-19 (×3): qty 1

## 2018-04-19 MED ORDER — FUROSEMIDE 10 MG/ML IJ SOLN
40.0000 mg | Freq: Every day | INTRAMUSCULAR | Status: DC
  Administered 2018-04-19 – 2018-04-20 (×2): 40 mg via INTRAVENOUS
  Filled 2018-04-19 (×3): qty 4

## 2018-04-19 NOTE — Progress Notes (Signed)
Subjective: She says she feels better.  She is still coughing and congested.  She is now down about 5 L since admission.  I do not think her weights are accurate she is coughing up sputum.  Objective: Vital signs in last 24 hours: Temp:  [97.9 F (36.6 C)-98 F (36.7 C)] 97.9 F (36.6 C) (02/21 0511) Pulse Rate:  [63-73] 73 (02/21 0511) Resp:  [18] 18 (02/21 0511) BP: (141-163)/(71-80) 163/80 (02/21 0511) SpO2:  [91 %-97 %] 94 % (02/21 0736) Weight:  [98.9 kg] 98.9 kg (02/21 0511) Weight change:  Last BM Date: 04/14/18  Intake/Output from previous day: 02/20 0701 - 02/21 0700 In: 363 [P.O.:360; I.V.:3] Out: 2903 [Urine:2901; Stool:2]  PHYSICAL EXAM General appearance: alert, cooperative and mild distress Resp: rhonchi bilaterally Cardio: regular rate and rhythm, S1, S2 normal, no murmur, click, rub or gallop GI: soft, non-tender; bowel sounds normal; no masses,  no organomegaly Extremities: Trace edema now  Lab Results:  Results for orders placed or performed during the hospital encounter of 04/15/18 (from the past 48 hour(s))  Glucose, capillary     Status: Abnormal   Collection Time: 04/17/18  8:14 AM  Result Value Ref Range   Glucose-Capillary 188 (H) 70 - 99 mg/dL  Basic metabolic panel     Status: Abnormal   Collection Time: 04/17/18  9:18 AM  Result Value Ref Range   Sodium 138 135 - 145 mmol/L   Potassium 4.1 3.5 - 5.1 mmol/L   Chloride 96 (L) 98 - 111 mmol/L   CO2 30 22 - 32 mmol/L   Glucose, Bld 250 (H) 70 - 99 mg/dL   BUN 49 (H) 8 - 23 mg/dL   Creatinine, Ser 1.69 (H) 0.44 - 1.00 mg/dL   Calcium 8.8 (L) 8.9 - 10.3 mg/dL   GFR calc non Af Amer 28 (L) >60 mL/min   GFR calc Af Amer 32 (L) >60 mL/min   Anion gap 12 5 - 15    Comment: Performed at [facility], [address] [place],  [address]  Glucose, capillary     Status: Abnormal   Collection Time: 04/17/18 10:58 AM  Result Value Ref Range   Glucose-Capillary 306 (H) 70 - 99 mg/dL  Glucose,  capillary     Status: Abnormal   Collection Time: 04/17/18  4:52 PM  Result Value Ref Range   Glucose-Capillary 196 (H) 70 - 99 mg/dL   Comment 1 Notify RN    Comment 2 Document in Chart   Glucose, capillary     Status: Abnormal   Collection Time: 04/17/18 10:05 PM  Result Value Ref Range   Glucose-Capillary 223 (H) 70 - 99 mg/dL   Comment 1 Notify RN    Comment 2 Document in Chart   Basic metabolic panel     Status: Abnormal   Collection Time: 04/18/18  4:51 AM  Result Value Ref Range   Sodium 139 135 - 145 mmol/L   Potassium 3.7 3.5 - 5.1 mmol/L   Chloride 95 (L) 98 - 111 mmol/L   CO2 33 (H) 22 - 32 mmol/L   Glucose, Bld 144 (H) 70 - 99 mg/dL   BUN 59 (H) 8 - 23 mg/dL   Creatinine, Ser 1.75 (H) 0.44 - 1.00 mg/dL   Calcium 8.7 (L) 8.9 - 10.3 mg/dL   GFR calc non Af Amer 27 (L) >60 mL/min   GFR calc Af Amer 31 (L) >60 mL/min   Anion gap 11 5 - 15  Comment: Performed at [facility], [address] [place], [place] [address]  CBC with Differential/Platelet     Status: Abnormal   Collection Time: 04/18/18  4:51 AM  Result Value Ref Range   WBC 7.1 4.0 - 10.5 K/uL   RBC 3.81 (L) 3.87 - 5.11 MIL/uL   Hemoglobin 10.1 (L) 12.0 - 15.0 g/dL   HCT 34.6 (L) 36.0 - 46.0 %   MCV 90.8 80.0 - 100.0 fL   MCH 26.5 26.0 - 34.0 pg   MCHC 29.2 (L) 30.0 - 36.0 g/dL   RDW 15.3 11.5 - 15.5 %   Platelets 146 (L) 150 - 400 K/uL   nRBC 0.0 0.0 - 0.2 %   Neutrophils Relative % 74 %   Neutro Abs 5.2 1.7 - 7.7 K/uL   Lymphocytes Relative 17 %   Lymphs Abs 1.2 0.7 - 4.0 K/uL   Monocytes Relative 9 %   Monocytes Absolute 0.7 0.1 - 1.0 K/uL   Eosinophils Relative 0 %   Eosinophils Absolute 0.0 0.0 - 0.5 K/uL   Basophils Relative 0 %   Basophils Absolute 0.0 0.0 - 0.1 K/uL   Immature Granulocytes 0 %   Abs Immature Granulocytes 0.03 0.00 - 0.07 K/uL    Comment: Performed at [facility], [address] [place], [place] [address]  Glucose, capillary     Status: None   Collection Time: 04/18/18   7:27 AM  Result Value Ref Range   Glucose-Capillary 97 70 - 99 mg/dL  Glucose, capillary     Status: Abnormal   Collection Time: 04/18/18 11:29 AM  Result Value Ref Range   Glucose-Capillary 235 (H) 70 - 99 mg/dL  Glucose, capillary     Status: Abnormal   Collection Time: 04/18/18  4:12 PM  Result Value Ref Range   Glucose-Capillary 190 (H) 70 - 99 mg/dL  Glucose, capillary     Status: Abnormal   Collection Time: 04/18/18  8:53 PM  Result Value Ref Range   Glucose-Capillary 155 (H) 70 - 99 mg/dL   Comment 1 Notify RN    Comment 2 Document in Chart   Basic metabolic panel     Status: Abnormal   Collection Time: 04/19/18  5:05 AM  Result Value Ref Range   Sodium 140 135 - 145 mmol/L   Potassium 5.0 3.5 - 5.1 mmol/L    Comment: DELTA CHECK NOTED   Chloride 96 (L) 98 - 111 mmol/L   CO2 33 (H) 22 - 32 mmol/L   Glucose, Bld 139 (H) 70 - 99 mg/dL   BUN 62 (H) 8 - 23 mg/dL   Creatinine, Ser 1.89 (H) 0.44 - 1.00 mg/dL   Calcium 9.2 8.9 - 10.3 mg/dL   GFR calc non Af Amer 24 (L) >60 mL/min   GFR calc Af Amer 28 (L) >60 mL/min   Anion gap 11 5 - 15    Comment: Performed at [facility], [address], [place], [place] [address]  Glucose, capillary     Status: Abnormal   Collection Time: 04/19/18  7:23 AM  Result Value Ref Range   Glucose-Capillary 122 (H) 70 - 99 mg/dL    ABGS No results for input(s): PHART, PO2ART, TCO2, HCO3 in the last 72 hours.  Invalid input(s): PCO2 CULTURES Recent Results (from the past 240 hour(s))  MRSA PCR Screening     Status: Abnormal   Collection Time: 04/16/18  8:41 AM  Result Value Ref Range Status   MRSA by PCR POSITIVE (  A) NEGATIVE Final    Comment:        The GeneXpert MRSA Assay (FDA approved for NASAL specimens only), is one component of a comprehensive MRSA colonization surveillance program. It is not intended to diagnose MRSA infection nor to guide or monitor treatment for MRSA infections. RESULT CALLED TO, READ BACK BY AND  VERIFIED WITH: MARTIN D. AT 1216P ON [id number] BY THOMPSON S. Performed at [facility], [address] [place], [place] [address]    Studies/Results: No results found.  Medications:  Prior to Admission:  Medications Prior to Admission  Medication Sig Dispense Refill Last Dose  . ALPHAGAN P 0.1 % SOLN Place 1 drop into both eyes 2 (two) times daily.    04/14/2018 at Unknown time  . amLODipine (NORVASC) 5 MG tablet Take 5 mg by mouth every morning.    04/14/2018 at Unknown time  . anastrozole (ARIMIDEX) 1 MG tablet Take 1 mg by mouth daily.   04/14/2018 at Unknown time  . aspirin EC 81 MG tablet Take 81 mg by mouth daily.   04/14/2018 at Unknown time  . atorvastatin (LIPITOR) 10 MG tablet Take 10 mg by mouth every evening.    04/14/2018 at Unknown time  . Bacillus Coagulans-Inulin (PROBIOTIC FORMULA PO) Take 1 capsule by mouth daily.   04/14/2018 at Unknown time  . cyanocobalamin (,VITAMIN B-12,) 1000 MCG/ML injection Inject 1,000 mcg into the muscle every 30 (thirty) days.    04/14/2018 at Unknown time  . dorzolamide (TRUSOPT) 2 % ophthalmic solution Place 1 drop into both eyes 2 (two) times daily.   4 04/14/2018 at Unknown time  . EPIPEN 2-PAK 0.3 MG/0.3ML SOAJ injection Inject 0.3 mg into the muscle as needed for anaphylaxis.    unknown  . fulvestrant (FASLODEX) 250 MG/5ML injection Inject 500 mg into the muscle every 14 (fourteen) days. One injection each buttock over 1-2 minutes. Warm prior to use.   04/12/2018  . gabapentin (NEURONTIN) 600 MG tablet Take 600 mg by mouth daily.    04/14/2018 at Unknown time  . glipiZIDE (GLUCOTROL) 5 MG tablet Take 5 mg by mouth daily before breakfast.    04/14/2018 at Unknown time  . hydrALAZINE (APRESOLINE) 25 MG tablet Take 25-50 mg by mouth See admin instructions. Take 50 mg by mouth in the morning and take 25 mg by mouth at bedtime   04/14/2018 at Unknown time  . irbesartan (AVAPRO) 150 MG tablet Take 150 mg by mouth daily.   04/14/2018 at Unknown time  .  latanoprost (XALATAN) 0.005 % ophthalmic solution Place 1 drop into both eyes at bedtime.    04/14/2018 at Unknown time  . metFORMIN (GLUCOPHAGE) 1000 MG tablet Take 1,000 mg by mouth daily.    04/14/2018 at Unknown time  . metoprolol succinate (TOPROL-XL) 100 MG 24 hr tablet Take 100 mg by mouth every morning. Take with or immediately following a meal.   04/14/2018 at 0800am  . mirtazapine (REMERON) 15 MG tablet Take 15 mg by mouth at bedtime.    04/14/2018 at Unknown time  . MYRBETRIQ 50 MG TB24 tablet Take 50 mg by mouth daily.    04/14/2018 at Unknown time  . omeprazole (PRILOSEC) 20 MG capsule Take 20 mg by mouth daily.   04/14/2018 at Unknown time  . Oxycodone HCl 10 MG TABS Take 10 mg by mouth daily.    04/14/2018 at Unknown time  . Polyethyl Glycol-Propyl Glycol (SYSTANE ULTRA) 0.4-0.3 % SOLN Place 1-2 drops into both eyes daily  as needed (for itching eyes).   04/14/2018 at Unknown time  . torsemide (DEMADEX) 20 MG tablet Take 1 tablet (20 mg total) by mouth daily. 30 tablet 5 04/14/2018 at Unknown time  . TRESIBA FLEXTOUCH 100 UNIT/ML SOPN FlexTouch Pen Inject 80 Units into the skin daily.    04/14/2018 at 0800am  . palbociclib (IBRANCE) 125 MG capsule Take 1 capsule (125 mg total) by mouth daily with breakfast. Take whole with food. Take for 21 days on, 7 days off, repeat every 28 days. (Patient not taking: Reported on 04/15/2018) 21 capsule 6 Not Taking at Unknown time   Scheduled: . amLODipine  5 mg Oral q morning - 10a  . anastrozole  1 mg Oral Daily  . aspirin EC  81 mg Oral Daily  . atorvastatin  10 mg Oral QPM  . brimonidine  1 drop Both Eyes BID  . budesonide (PULMICORT) nebulizer solution  0.25 mg Nebulization BID  . Chlorhexidine Gluconate Cloth  6 each Topical Q0600  . dextromethorphan-guaiFENesin  1 tablet Oral BID  . docusate sodium  100 mg Oral BID  . dorzolamide  1 drop Both Eyes BID  . doxycycline  100 mg Oral Q12H  . furosemide  40 mg Intravenous Daily  . gabapentin  600 mg  Oral Daily  . hydrALAZINE  25 mg Oral QHS  . hydrALAZINE  50 mg Oral q morning - 10a  . insulin aspart  0-20 Units Subcutaneous TID WC  . insulin aspart  0-5 Units Subcutaneous QHS  . insulin aspart  5 Units Subcutaneous TID WC  . insulin glargine  80 Units Subcutaneous QHS  . ipratropium-albuterol  3 mL Nebulization Q6H  . irbesartan  150 mg Oral Daily  . latanoprost  1 drop Both Eyes QHS  . methylPREDNISolone (SOLU-MEDROL) injection  40 mg Intravenous Q12H  . metoprolol succinate  100 mg Oral q morning - 10a  . mirabegron ER  50 mg Oral Daily  . mirtazapine  15 mg Oral QHS  . mupirocin ointment  1 application Nasal BID  . oseltamivir  30 mg Oral Daily  . oxyCODONE  10 mg Oral Daily  . pantoprazole  40 mg Oral Daily  . saccharomyces boulardii  250 mg Oral Daily  . sodium chloride flush  3 mL Intravenous Q12H   Continuous: . sodium chloride     PXT:GGYIRS chloride, acetaminophen **OR** acetaminophen, guaiFENesin-dextromethorphan, ondansetron **OR** ondansetron (ZOFRAN) IV, polyvinyl alcohol, sodium chloride flush  Assesment: She was admitted with acute hypoxic respiratory failure that I think is a combination of acute bronchitis and acute on chronic diastolic heart failure.  She is improving.  She is still short of breath with minimal exertion.  She is still coughing up discolored sputum.  She is down about 5 L and her renal function is not as good.  She has had a creatinine as high as 2 as an outpatient.  She has hypertension that is pretty well controlled  She has diabetes on sliding scale and she is doing well  She has recent diagnosis of recurrent breast cancer and she is being treated for that Principal Problem:   Acute hypoxemic respiratory failure ([place]) Active Problems:   DM type 2 (diabetes mellitus, type 2) ([place])   Hyperlipidemia   GERD (gastroesophageal reflux disease)   Essential hypertension, benign   Recurrent breast adenocarcinoma, left (HCC)   Acute on chronic  diastolic CHF (congestive heart failure) ([place])    Plan: Decrease her diuretic.  Continue antibiotics.  Continue nebulizer treatments.  She is not ready for discharge.    LOS: 4 days   [name] 04/19/2018, 8:12 AM

## 2018-04-19 NOTE — Care Management (Signed)
Met with patient again to discuss options for a lighter walker if possible. Daughter still has not brought in walker for [place] to see. She said she spoke with our PT and he will be working this weekend, and will assess if daughter will bring in. If there is a lighter version available, she will need an order and CM will need to be notified.

## 2018-04-19 NOTE — Progress Notes (Signed)
Physical Therapy Treatment Patient Details Name: [name] MRN: [id number] DOB: Aug 11, 1933 Today's Date: 04/19/2018    History of Present Illness [name] is a 85 y.o. female with medical history significant for type 2 diabetes, hypertension, GERD, dyslipidemia, and recurrent breast cancer stage III that she was recently rediagnosed with.  She presents to the ED with worsening shortness of breath along with dry cough over the last 2 days and received nebulizer treatment and IV steroids in route via EMS.  She has been given a nebulized treatment as well as some IV Lasix.  She is noted to have some bilateral lower extremity edema and has not been checking her weight.  She denies any orthopnea, chest pain, fever, or chills.  She has been taking her other medications as usual.  Daughter at bedside does state that patient has been in and out of hospitals quite frequently recently due to the recent cancer diagnosis.    PT Comments    Patient presents seated EOB on phone and agreeable to therapy. Pt with improved strength/endurance per ability to ambulate further distance using RW demonstrating decreased cadence, mild shortness of breath, forward flexed trunk with bil forearms on RW, and decreased heel-toe pattern. Pt with good transfer technique performing with RW requiring bil UE assist to push up from seated surface and positions AD appropriately. Pt limited by fatigue today, but able to remain up in chair after therapy session with bil LE elevated and call bell in hand. Pt will benefit from continued physical therapy in hospital and recommended venue below to increase strength, balance, endurance for safe ADLs and gait.    Follow Up Recommendations  Home health PT;Supervision - Intermittent     Equipment Recommendations  Rolling walker with 5" wheels(replacement Rollator with bigger wheels if possible)    Recommendations for Other Services       Precautions / Restrictions  Precautions Precautions: Fall Restrictions Weight Bearing Restrictions: No    Mobility  Bed Mobility               General bed mobility comments: seated EOB upon arrival  Transfers Overall transfer level: Needs assistance Equipment used: Rolling walker (2 wheeled) Transfers: Sit to/from [facility] Sit to Stand: Supervision Stand pivot transfers: Supervision       General transfer comment: increased time, verbal cues for hand placement  Ambulation/Gait Ambulation/Gait assistance: Min guard Gait Distance (Feet): 75 Feet Assistive device: Rolling walker (2 wheeled) Gait Pattern/deviations: Decreased step length - right;Decreased step length - left;Decreased stride length;Trunk flexed Gait velocity: decreased   General Gait Details: Pt with decreased heel-toe pattern, forward flexed trunk with bil forearms on RW, limited secondary to fatigue   Stairs             Wheelchair Mobility    Modified Rankin (Stroke Patients Only)       Balance Overall balance assessment: Needs assistance Sitting-balance support: Feet supported;No upper extremity supported Sitting balance-Leahy Scale: Good Sitting balance - Comments: seated EOB   Standing balance support: During functional activity;Bilateral upper extremity supported Standing balance-Leahy Scale: Fair Standing balance comment: fair/good using RW                            Cognition Arousal/Alertness: Awake/alert Behavior During Therapy: WFL for tasks assessed/performed Overall Cognitive Status: Within Functional Limits for tasks assessed  Exercises      General Comments        Pertinent Vitals/Pain Pain Assessment: No/denies pain    Home Living                      Prior Function            PT Goals (current goals can now be found in the care plan section) Acute Rehab PT Goals Patient Stated Goal:  return home with family to assist PT Goal Formulation: With patient Time For Goal Achievement: 04/24/18 Potential to Achieve Goals: Good Progress towards PT goals: Progressing toward goals    Frequency    Min 3X/week      PT Plan Current plan remains appropriate    Co-evaluation              AM-PAC PT "6 Clicks" Mobility   Outcome Measure  Help needed turning from your back to your side while in a flat bed without using bedrails?: None Help needed moving from lying on your back to sitting on the side of a flat bed without using bedrails?: None(had to side bedrail) Help needed moving to and from a bed to a chair (including a wheelchair)?: A Little Help needed standing up from a chair using your arms (e.g., wheelchair or bedside chair)?: A Little Help needed to walk in hospital room?: A Little Help needed climbing 3-5 steps with a railing? : A Lot 6 Click Score: 19    End of Session   Activity Tolerance: Patient tolerated treatment well;Patient limited by fatigue Patient left: in chair;with call bell/phone within reach Nurse Communication: Mobility status PT Visit Diagnosis: Unsteadiness on feet (R26.81);Other abnormalities of gait and mobility (R26.89);Muscle weakness (generalized) (M62.81)     Time: 4765-4650 PT Time Calculation (min) (ACUTE ONLY): 16 min  Charges:  $Gait Training: 8-22 mins                     1:53 PM, 04/19/18 Talbot Grumbling, DPT Physical Therapist with [facility] [phone] office

## 2018-04-19 NOTE — Plan of Care (Signed)

## 2018-04-20 LAB — GLUCOSE, CAPILLARY
GLUCOSE-CAPILLARY: 251 mg/dL — AB (ref 70–99)
Glucose-Capillary: 70 mg/dL (ref 70–99)
Glucose-Capillary: 316 mg/dL — ABNORMAL HIGH (ref 70–99)
Glucose-Capillary: 240 mg/dL — ABNORMAL HIGH (ref 70–99)

## 2018-04-20 LAB — BASIC METABOLIC PANEL
Anion gap: 12 (ref 5–15)
BUN: 67 mg/dL — ABNORMAL HIGH (ref 8–23)
CHLORIDE: 97 mmol/L — AB (ref 98–111)
CO2: 33 mmol/L — ABNORMAL HIGH (ref 22–32)
Calcium: 9.2 mg/dL (ref 8.9–10.3)
Creatinine, Ser: 1.75 mg/dL — ABNORMAL HIGH (ref 0.44–1.00)
GFR calc Af Amer: 31 mL/min — ABNORMAL LOW (ref >60)
GFR calc non Af Amer: 27 mL/min — ABNORMAL LOW (ref >60)
Glucose, Bld: 58 mg/dL — ABNORMAL LOW (ref 70–99)
POTASSIUM: 5 mmol/L (ref 3.5–5.1)
Sodium: 142 mmol/L (ref 135–145)

## 2018-04-20 MED ORDER — PREDNISONE 20 MG PO TABS
40.0000 mg | ORAL_TABLET | Freq: Every day | ORAL | Status: DC
  Administered 2018-04-20 – 2018-04-23 (×4): 40 mg via ORAL
  Filled 2018-04-20 (×4): qty 2

## 2018-04-20 NOTE — Progress Notes (Addendum)
Physical Therapy Treatment Patient Details Name: [name] MRN: [id number] DOB: Feb 07, 1934 Today's Date: 04/20/2018    History of Present Illness [name] is a 85 y.o. female with medical history significant for type 2 diabetes, hypertension, GERD, dyslipidemia, and recurrent breast cancer stage III that she was recently rediagnosed with.  She presents to the ED with worsening shortness of breath along with dry cough over the last 2 days and received nebulizer treatment and IV steroids in route via EMS.  She has been given a nebulized treatment as well as some IV Lasix.  She is noted to have some bilateral lower extremity edema and has not been checking her weight.  She denies any orthopnea, chest pain, fever, or chills.  She has been taking her other medications as usual.  [name] at bedside does state that patient has been in and out of hospitals quite frequently recently due to the recent cancer diagnosis.    PT Comments    Patient demonstrates good return for stand pivot bed/BSC/chair transfers using armrest of chair/BSC without loss of balance, slow labored cadence during ambulation while leaning over RW with elbows without loss of balance, limited mostly due to c/o fatigue.  Patient tolerated sitting up in chair after therapy.  Patient will benefit from continued physical therapy in hospital and recommended venue below to increase strength, balance, endurance for safe ADLs and gait.  Patient states her [name] is supposed to bring her Rollator to hospital tomorrow to see if our vendors have a lighter version with bigger wheels as compared to her Rollator.   Follow Up Recommendations  Home health PT;Supervision - Intermittent     Equipment Recommendations  Rolling walker with 5" wheels(patient wants lighter Rollator with bigger wheels)    Recommendations for Other Services       Precautions / Restrictions Precautions Precautions: Fall Restrictions Weight Bearing  Restrictions: No    Mobility  Bed Mobility               General bed mobility comments: Patient present seated at bedside  Transfers Overall transfer level: Needs assistance Equipment used: Rolling walker (2 wheeled) Transfers: Sit to/from [employer] Transfers Sit to Stand: Supervision Stand pivot transfers: Supervision       General transfer comment: increased time  Ambulation/Gait Ambulation/Gait assistance: Supervision;Min guard Gait Distance (Feet): 40 Feet Assistive device: Rolling walker (2 wheeled) Gait Pattern/deviations: Decreased step length - right;Decreased step length - left;Decreased stride length Gait velocity: decreased   General Gait Details: Patient has to lean on RW with elbows with kyphotic posture, labored slow cadence without loss of balance, limited secondary to c/o fatigue   Stairs             Wheelchair Mobility    Modified Rankin (Stroke Patients Only)       Balance Overall balance assessment: Needs assistance Sitting-balance support: Feet supported;No upper extremity supported Sitting balance-Leahy Scale: Good     Standing balance support: Bilateral upper extremity supported;During functional activity Standing balance-Leahy Scale: Fair Standing balance comment: fair/good using RW                            Cognition Arousal/Alertness: Awake/alert Behavior During Therapy: WFL for tasks assessed/performed Overall Cognitive Status: Within Functional Limits for tasks assessed  Exercises General Exercises - Lower Extremity Long Arc Quad: Seated;Strengthening;AROM;Both;10 reps Hip Flexion/Marching: Seated;Strengthening;AROM;Both;10 reps Toe Raises: Seated;Strengthening;AROM;Both;10 reps Heel Raises: Seated;Strengthening;AROM;Both;10 reps    General Comments        Pertinent Vitals/Pain Pain Assessment: No/denies pain    Home Living                       Prior Function            PT Goals (current goals can now be found in the care plan section) Acute Rehab PT Goals Patient Stated Goal: return home with family to assist PT Goal Formulation: With patient Time For Goal Achievement: 04/24/18 Potential to Achieve Goals: Good Progress towards PT goals: Progressing toward goals    Frequency    Min 3X/week      PT Plan Current plan remains appropriate    Co-evaluation              AM-PAC PT "6 Clicks" Mobility   Outcome Measure  Help needed turning from your back to your side while in a flat bed without using bedrails?: None Help needed moving from lying on your back to sitting on the side of a flat bed without using bedrails?: None Help needed moving to and from a bed to a chair (including a wheelchair)?: None Help needed standing up from a chair using your arms (e.g., wheelchair or bedside chair)?: None Help needed to walk in hospital room?: A Little Help needed climbing 3-5 steps with a railing? : A Lot 6 Click Score: 21    End of Session   Activity Tolerance: Patient tolerated treatment well;Patient limited by fatigue Patient left: in chair;with call bell/phone within reach Nurse Communication: Mobility status PT Visit Diagnosis: Unsteadiness on feet (R26.81);Other abnormalities of gait and mobility (R26.89);Muscle weakness (generalized) (M62.81)     Time: 9030-0923 PT Time Calculation (min) (ACUTE ONLY): 25 min  Charges:  $Gait Training: 8-22 mins $Therapeutic Exercise: 8-22 mins                     12:15 PM, 04/20/18 Lonell Grandchild, MPT Physical Therapist with [facility] [phone] office [phone] mobile phone

## 2018-04-20 NOTE — Progress Notes (Signed)
Subjective: She says she can tell a difference now.  She does feel better.  She still has some swelling of her legs.  She is still coughing mostly nonproductively.  She had some pain in her postop breast yesterday evening but it resolved with tramadol  Objective: Vital signs in last 24 hours: Temp:  [97.8 F (36.6 C)-98.5 F (36.9 C)] 98.5 F (36.9 C) (02/22 0757) Pulse Rate:  [57-72] 72 (02/22 0757) Resp:  [16-20] 16 (02/22 0757) BP: (133-173)/(59-89) 133/86 (02/22 0757) SpO2:  [93 %-96 %] 93 % (02/22 0757) Weight:  [90 kg] 90 kg (02/22 0500) Weight change: -8.9 kg Last BM Date: 04/14/18  Intake/Output from previous day: 02/21 0701 - 02/22 0700 In: 363 [P.O.:360; I.V.:3] Out: 1701 [Urine:1701]  PHYSICAL EXAM General appearance: alert, cooperative and mild distress Resp: She still has some wheezing but her chest is clearing Cardio: regular rate and rhythm, S1, S2 normal, no murmur, click, rub or gallop GI: soft, non-tender; bowel sounds normal; no masses,  no organomegaly Extremities: extremities normal, atraumatic, no cyanosis or edema  Lab Results:  Results for orders placed or performed during the hospital encounter of 04/15/18 (from the past 48 hour(s))  Glucose, capillary     Status: Abnormal   Collection Time: 04/18/18 11:29 AM  Result Value Ref Range   Glucose-Capillary 235 (H) 70 - 99 mg/dL  Glucose, capillary     Status: Abnormal   Collection Time: 04/18/18  4:12 PM  Result Value Ref Range   Glucose-Capillary 190 (H) 70 - 99 mg/dL  Glucose, capillary     Status: Abnormal   Collection Time: 04/18/18  8:53 PM  Result Value Ref Range   Glucose-Capillary 155 (H) 70 - 99 mg/dL   Comment 1 Notify RN    Comment 2 Document in Chart   Basic metabolic panel     Status: Abnormal   Collection Time: 04/19/18  5:05 AM  Result Value Ref Range   Sodium 140 135 - 145 mmol/L   Potassium 5.0 3.5 - 5.1 mmol/L    Comment: DELTA CHECK NOTED   Chloride 96 (L) 98 - 111 mmol/L   CO2 33 (H) 22 - 32 mmol/L   Glucose, Bld 139 (H) 70 - 99 mg/dL   BUN 62 (H) 8 - 23 mg/dL   Creatinine, Ser 1.89 (H) 0.44 - 1.00 mg/dL   Calcium 9.2 8.9 - 10.3 mg/dL   GFR calc non Af Amer 24 (L) >60 mL/min   GFR calc Af Amer 28 (L) >60 mL/min   Anion gap 11 5 - 15    Comment: Performed at [facility], [address], [place], [place] [address]  Glucose, capillary     Status: Abnormal   Collection Time: 04/19/18  7:23 AM  Result Value Ref Range   Glucose-Capillary 122 (H) 70 - 99 mg/dL  Glucose, capillary     Status: Abnormal   Collection Time: 04/19/18 11:38 AM  Result Value Ref Range   Glucose-Capillary 347 (H) 70 - 99 mg/dL  Glucose, capillary     Status: Abnormal   Collection Time: 04/19/18  4:27 PM  Result Value Ref Range   Glucose-Capillary 272 (H) 70 - 99 mg/dL  Glucose, capillary     Status: Abnormal   Collection Time: 04/19/18  9:12 PM  Result Value Ref Range   Glucose-Capillary 128 (H) 70 - 99 mg/dL   Comment 1 Notify RN    Comment 2 Document in Chart   Basic metabolic panel  Status: Abnormal   Collection Time: 04/20/18  6:02 AM  Result Value Ref Range   Sodium 142 135 - 145 mmol/L   Potassium 5.0 3.5 - 5.1 mmol/L   Chloride 97 (L) 98 - 111 mmol/L   CO2 33 (H) 22 - 32 mmol/L   Glucose, Bld 58 (L) 70 - 99 mg/dL   BUN 67 (H) 8 - 23 mg/dL   Creatinine, Ser 1.75 (H) 0.44 - 1.00 mg/dL   Calcium 9.2 8.9 - 10.3 mg/dL   GFR calc non Af Amer 27 (L) >60 mL/min   GFR calc Af Amer 31 (L) >60 mL/min   Anion gap 12 5 - 15    Comment: Performed at [facility], [address], [place], [place] [address]  Glucose, capillary     Status: None   Collection Time: 04/20/18  8:00 AM  Result Value Ref Range   Glucose-Capillary 70 70 - 99 mg/dL   Comment 1 Notify RN    Comment 2 Document in Chart     ABGS No results for input(s): PHART, PO2ART, TCO2, HCO3 in the last 72 hours.  Invalid input(s): PCO2 CULTURES Recent Results (from the past 240 hour(s))  MRSA PCR Screening      Status: Abnormal   Collection Time: 04/16/18  8:41 AM  Result Value Ref Range Status   MRSA by PCR POSITIVE (A) NEGATIVE Final    Comment:        The GeneXpert MRSA Assay (FDA approved for NASAL specimens only), is one component of a comprehensive MRSA colonization surveillance program. It is not intended to diagnose MRSA infection nor to guide or monitor treatment for MRSA infections. RESULT CALLED TO, READ BACK BY AND VERIFIED WITH: MARTIN D. AT 1216P ON [id number] BY THOMPSON S. Performed at [facility], [address] [place], [place] [address]    Studies/Results: No results found.  Medications:  Prior to Admission:  Medications Prior to Admission  Medication Sig Dispense Refill Last Dose  . ALPHAGAN P 0.1 % SOLN Place 1 drop into both eyes 2 (two) times daily.    04/14/2018 at Unknown time  . amLODipine (NORVASC) 5 MG tablet Take 5 mg by mouth every morning.    04/14/2018 at Unknown time  . anastrozole (ARIMIDEX) 1 MG tablet Take 1 mg by mouth daily.   04/14/2018 at Unknown time  . aspirin EC 81 MG tablet Take 81 mg by mouth daily.   04/14/2018 at Unknown time  . atorvastatin (LIPITOR) 10 MG tablet Take 10 mg by mouth every evening.    04/14/2018 at Unknown time  . Bacillus Coagulans-Inulin (PROBIOTIC FORMULA PO) Take 1 capsule by mouth daily.   04/14/2018 at Unknown time  . cyanocobalamin (,VITAMIN B-12,) 1000 MCG/ML injection Inject 1,000 mcg into the muscle every 30 (thirty) days.    04/14/2018 at Unknown time  . dorzolamide (TRUSOPT) 2 % ophthalmic solution Place 1 drop into both eyes 2 (two) times daily.   4 04/14/2018 at Unknown time  . EPIPEN 2-PAK 0.3 MG/0.3ML SOAJ injection Inject 0.3 mg into the muscle as needed for anaphylaxis.    unknown  . fulvestrant (FASLODEX) 250 MG/5ML injection Inject 500 mg into the muscle every 14 (fourteen) days. One injection each buttock over 1-2 minutes. Warm prior to use.   04/12/2018  . gabapentin (NEURONTIN) 600 MG tablet Take 600 mg by mouth  daily.    04/14/2018 at Unknown time  . glipiZIDE (GLUCOTROL) 5 MG tablet Take 5 mg by mouth daily  before breakfast.    04/14/2018 at Unknown time  . hydrALAZINE (APRESOLINE) 25 MG tablet Take 25-50 mg by mouth See admin instructions. Take 50 mg by mouth in the morning and take 25 mg by mouth at bedtime   04/14/2018 at Unknown time  . irbesartan (AVAPRO) 150 MG tablet Take 150 mg by mouth daily.   04/14/2018 at Unknown time  . latanoprost (XALATAN) 0.005 % ophthalmic solution Place 1 drop into both eyes at bedtime.    04/14/2018 at Unknown time  . metFORMIN (GLUCOPHAGE) 1000 MG tablet Take 1,000 mg by mouth daily.    04/14/2018 at Unknown time  . metoprolol succinate (TOPROL-XL) 100 MG 24 hr tablet Take 100 mg by mouth every morning. Take with or immediately following a meal.   04/14/2018 at 0800am  . mirtazapine (REMERON) 15 MG tablet Take 15 mg by mouth at bedtime.    04/14/2018 at Unknown time  . MYRBETRIQ 50 MG TB24 tablet Take 50 mg by mouth daily.    04/14/2018 at Unknown time  . omeprazole (PRILOSEC) 20 MG capsule Take 20 mg by mouth daily.   04/14/2018 at Unknown time  . Oxycodone HCl 10 MG TABS Take 10 mg by mouth daily.    04/14/2018 at Unknown time  . Polyethyl Glycol-Propyl Glycol (SYSTANE ULTRA) 0.4-0.3 % SOLN Place 1-2 drops into both eyes daily as needed (for itching eyes).   04/14/2018 at Unknown time  . torsemide (DEMADEX) 20 MG tablet Take 1 tablet (20 mg total) by mouth daily. 30 tablet 5 04/14/2018 at Unknown time  . TRESIBA FLEXTOUCH 100 UNIT/ML SOPN FlexTouch Pen Inject 80 Units into the skin daily.    04/14/2018 at 0800am  . palbociclib (IBRANCE) 125 MG capsule Take 1 capsule (125 mg total) by mouth daily with breakfast. Take whole with food. Take for 21 days on, 7 days off, repeat every 28 days. (Patient not taking: Reported on 04/15/2018) 21 capsule 6 Not Taking at Unknown time   Scheduled: . amLODipine  5 mg Oral q morning - 10a  . anastrozole  1 mg Oral Daily  . aspirin EC  81 mg Oral  Daily  . atorvastatin  10 mg Oral QPM  . brimonidine  1 drop Both Eyes BID  . budesonide (PULMICORT) nebulizer solution  0.25 mg Nebulization BID  . Chlorhexidine Gluconate Cloth  6 each Topical Q0600  . dextromethorphan-guaiFENesin  1 tablet Oral BID  . docusate sodium  100 mg Oral BID  . dorzolamide  1 drop Both Eyes BID  . doxycycline  100 mg Oral Q12H  . furosemide  40 mg Intravenous Daily  . gabapentin  600 mg Oral Daily  . hydrALAZINE  25 mg Oral QHS  . hydrALAZINE  50 mg Oral q morning - 10a  . insulin aspart  0-20 Units Subcutaneous TID WC  . insulin aspart  0-5 Units Subcutaneous QHS  . insulin aspart  5 Units Subcutaneous TID WC  . insulin glargine  80 Units Subcutaneous QHS  . ipratropium-albuterol  3 mL Nebulization TID  . irbesartan  150 mg Oral Daily  . latanoprost  1 drop Both Eyes QHS  . methylPREDNISolone (SOLU-MEDROL) injection  40 mg Intravenous Q12H  . metoprolol succinate  100 mg Oral q morning - 10a  . mirabegron ER  50 mg Oral Daily  . mirtazapine  15 mg Oral QHS  . mupirocin ointment  1 application Nasal BID  . oseltamivir  30 mg Oral Daily  . oxyCODONE  10  mg Oral Daily  . pantoprazole  40 mg Oral Daily  . saccharomyces boulardii  250 mg Oral Daily  . sodium chloride flush  3 mL Intravenous Q12H   Continuous: . sodium chloride     KUV:JDYNXG chloride, acetaminophen **OR** acetaminophen, albuterol, guaiFENesin-dextromethorphan, ondansetron **OR** ondansetron (ZOFRAN) IV, polyvinyl alcohol, sodium chloride flush, traMADol  Assesment: She was admitted with acute hypoxic respiratory failure which is a combination of acute bronchitis, acute on chronic diastolic heart failure and influenza A.  She has finished treatment for influenza.  She is better.  As far as the bronchitis is concerned she still has some wheezing and is still coughing mostly nonproductively  As far as her diastolic heart failure is concerned she is down almost 9 kg  She has acute on  chronic renal failure and I reduced her diuresis yesterday and her creatinine is better this morning.  She has recurrent breast cancer and had some pain last night Principal Problem:   Acute hypoxemic respiratory failure (HCC) Active Problems:   DM type 2 (diabetes mellitus, type 2) (HCC)   Hyperlipidemia   GERD (gastroesophageal reflux disease)   Essential hypertension, benign   Recurrent breast adenocarcinoma, left (HCC)   Acute on chronic diastolic CHF (congestive heart failure) ([place])    Plan: Continue treatments.  Anticipate discharge in the next 48 hours.    LOS: 5 days   [name] 04/20/2018, 9:35 AM

## 2018-04-21 LAB — BASIC METABOLIC PANEL
Anion gap: 10 (ref 5–15)
BUN: 73 mg/dL — ABNORMAL HIGH (ref 8–23)
CO2: 33 mmol/L — ABNORMAL HIGH (ref 22–32)
Calcium: 9.2 mg/dL (ref 8.9–10.3)
Chloride: 98 mmol/L (ref 98–111)
Creatinine, Ser: 1.72 mg/dL — ABNORMAL HIGH (ref 0.44–1.00)
GFR calc Af Amer: 32 mL/min — ABNORMAL LOW (ref >60)
GFR calc non Af Amer: 27 mL/min — ABNORMAL LOW (ref >60)
Glucose, Bld: 73 mg/dL (ref 70–99)
POTASSIUM: 4.4 mmol/L (ref 3.5–5.1)
SODIUM: 141 mmol/L (ref 135–145)

## 2018-04-21 LAB — GLUCOSE, CAPILLARY
GLUCOSE-CAPILLARY: 44 mg/dL — AB (ref 70–99)
GLUCOSE-CAPILLARY: 139 mg/dL — AB (ref 70–99)
Glucose-Capillary: 110 mg/dL — ABNORMAL HIGH (ref 70–99)
Glucose-Capillary: 53 mg/dL — ABNORMAL LOW (ref 70–99)
Glucose-Capillary: 277 mg/dL — ABNORMAL HIGH (ref 70–99)
Glucose-Capillary: 96 mg/dL (ref 70–99)

## 2018-04-21 MED ORDER — INSULIN GLARGINE 100 UNIT/ML ~~LOC~~ SOLN
70.0000 [IU] | Freq: Every day | SUBCUTANEOUS | Status: DC
  Administered 2018-04-21: 70 [IU] via SUBCUTANEOUS
  Filled 2018-04-21 (×2): qty 0.7

## 2018-04-21 MED ORDER — FUROSEMIDE 40 MG PO TABS
40.0000 mg | ORAL_TABLET | Freq: Every day | ORAL | Status: DC
  Administered 2018-04-21 – 2018-04-23 (×3): 40 mg via ORAL
  Filled 2018-04-21 (×3): qty 1

## 2018-04-21 NOTE — Progress Notes (Signed)
Physical Therapy Treatment Patient Details Name: [name] MRN: [id number] DOB: Jul 25, 1933 Today's Date: 04/21/2018    History of Present Illness [name] is a 85 y.o. female with medical history significant for type 2 diabetes, hypertension, GERD, dyslipidemia, and recurrent breast cancer stage III that she was recently rediagnosed with.  She presents to the ED with worsening shortness of breath along with dry cough over the last 2 days and received nebulizer treatment and IV steroids in route via EMS.  She has been given a nebulized treatment as well as some IV Lasix.  She is noted to have some bilateral lower extremity edema and has not been checking her weight.  She denies any orthopnea, chest pain, fever, or chills.  She has been taking her other medications as usual.  Daughter at bedside does state that patient has been in and out of hospitals quite frequently recently due to the recent cancer diagnosis.    PT Comments    Patient presents with her rollator in room and demonstrates good return for safe ambulation while leaning on armrest with elbows without loss of balance.  Patient states she needs a rollator with bigger wheels due to having to ambulate on gravel at home.  Patient tolerated staying up in chair with legs elevated after therapy and advised to keep her rollator in room until DME vender can assess for ordering a rollator with larger wheels.    Follow Up Recommendations  Home health PT;Supervision - Intermittent     Equipment Recommendations  Rolling walker with 5" wheels    Recommendations for Other Services       Precautions / Restrictions Precautions Precautions: Fall Restrictions Weight Bearing Restrictions: No    Mobility  Bed Mobility               General bed mobility comments: presents up in chair (assisted by nursing staff)  Transfers Overall transfer level: Needs assistance Equipment used: 4-wheeled walker;None Transfers: Sit  to/from [employer] Sit to Stand: Supervision Stand pivot transfers: Supervision       General transfer comment: increased time  Ambulation/Gait Ambulation/Gait assistance: Supervision Gait Distance (Feet): 50 Feet Assistive device: 4-wheeled walker Gait Pattern/deviations: Decreased step length - right;Decreased step length - left;Decreased stride length Gait velocity: decreased   General Gait Details: Patient demonstrates good return for using 4-wheeed walker during ambulation by leaning elbows on armrest without loss of balance, slow labored cadence, limited secondary to fatigue   Stairs             Wheelchair Mobility    Modified Rankin (Stroke Patients Only)       Balance Overall balance assessment: Needs assistance Sitting-balance support: Feet supported;Bilateral upper extremity supported Sitting balance-Leahy Scale: Good     Standing balance support: Bilateral upper extremity supported;During functional activity Standing balance-Leahy Scale: Fair Standing balance comment: fair/good using RW                            Cognition Arousal/Alertness: Awake/alert Behavior During Therapy: WFL for tasks assessed/performed Overall Cognitive Status: Within Functional Limits for tasks assessed                                        Exercises General Exercises - Lower Extremity Ankle Circles/Pumps: Seated;AROM;Both;10 reps;Strengthening Long Arc Quad: Seated;Strengthening;AROM;Both;10 reps Hip Flexion/Marching: Seated;Strengthening;AROM;Both;10 reps  General Comments        Pertinent Vitals/Pain Pain Assessment: No/denies pain    Home Living                      Prior Function            PT Goals (current goals can now be found in the care plan section) Acute Rehab PT Goals Patient Stated Goal: return home with family to assist PT Goal Formulation: With patient Time For Goal Achievement:  04/24/18 Potential to Achieve Goals: Good Progress towards PT goals: Progressing toward goals    Frequency    Min 3X/week      PT Plan Current plan remains appropriate    Co-evaluation              AM-PAC PT "6 Clicks" Mobility   Outcome Measure  Help needed turning from your back to your side while in a flat bed without using bedrails?: None Help needed moving from lying on your back to sitting on the side of a flat bed without using bedrails?: None Help needed moving to and from a bed to a chair (including a wheelchair)?: None Help needed standing up from a chair using your arms (e.g., wheelchair or bedside chair)?: None Help needed to walk in hospital room?: A Little Help needed climbing 3-5 steps with a railing? : A Lot 6 Click Score: 21    End of Session   Activity Tolerance: Patient tolerated treatment well;Patient limited by fatigue Patient left: in chair;with call bell/phone within reach Nurse Communication: Mobility status PT Visit Diagnosis: Unsteadiness on feet (R26.81);Other abnormalities of gait and mobility (R26.89);Muscle weakness (generalized) (M62.81)     Time: 3570-1779 PT Time Calculation (min) (ACUTE ONLY): 24 min  Charges:  $Gait Training: 8-22 mins $Therapeutic Exercise: 8-22 mins                     12:40 PM, 04/21/18 Lonell Grandchild, MPT Physical Therapist with [facility] [phone] office [phone] mobile phone

## 2018-04-21 NOTE — Progress Notes (Signed)
Subjective: She feels better but is still coughing up purulent sputum.  Her blood sugar was low this morning.  She did not take a midnight snack last night but she says that she gets some low blood sugars at home as well.  Her breathing is better.  She is still very weak.  Objective: Vital signs in last 24 hours: Temp:  [97.7 F (36.5 C)-98.4 F (36.9 C)] 97.8 F (36.6 C) (02/23 3254) Pulse Rate:  [57-64] 57 (02/23 0638) Resp:  [17-18] 17 (02/23 9826) BP: (109-161)/(62-96) 123/96 (02/23 0638) SpO2:  [94 %-99 %] 94 % (02/23 0726) Weight:  [87.8 kg-89.9 kg] 87.8 kg (02/23 4158) Weight change: -0.1 kg Last BM Date: 04/20/18  Intake/Output from previous day: 02/22 0701 - 02/23 0700 In: 603 [P.O.:600; I.V.:3] Out: 1850 [Urine:1850]  PHYSICAL EXAM General appearance: alert, cooperative and no distress Resp: rhonchi bilaterally Cardio: regular rate and rhythm, S1, S2 normal, no murmur, click, rub or gallop GI: soft, non-tender; bowel sounds normal; no masses,  no organomegaly Extremities: extremities normal, atraumatic, no cyanosis or edema  Lab Results:  Results for orders placed or performed during the hospital encounter of 04/15/18 (from the past 48 hour(s))  Glucose, capillary     Status: Abnormal   Collection Time: 04/19/18 11:38 AM  Result Value Ref Range   Glucose-Capillary 347 (H) 70 - 99 mg/dL  Glucose, capillary     Status: Abnormal   Collection Time: 04/19/18  4:27 PM  Result Value Ref Range   Glucose-Capillary 272 (H) 70 - 99 mg/dL  Glucose, capillary     Status: Abnormal   Collection Time: 04/19/18  9:12 PM  Result Value Ref Range   Glucose-Capillary 128 (H) 70 - 99 mg/dL   Comment 1 Notify RN    Comment 2 Document in Chart   Basic metabolic panel     Status: Abnormal   Collection Time: 04/20/18  6:02 AM  Result Value Ref Range   Sodium 142 135 - 145 mmol/L   Potassium 5.0 3.5 - 5.1 mmol/L   Chloride 97 (L) 98 - 111 mmol/L   CO2 33 (H) 22 - 32 mmol/L   Glucose, Bld 58 (L) 70 - 99 mg/dL   BUN 67 (H) 8 - 23 mg/dL   Creatinine, Ser 1.75 (H) 0.44 - 1.00 mg/dL   Calcium 9.2 8.9 - 10.3 mg/dL   GFR calc non Af Amer 27 (L) >60 mL/min   GFR calc Af Amer 31 (L) >60 mL/min   Anion gap 12 5 - 15    Comment: Performed at [facility], [address] [place], [place] [address]  Glucose, capillary     Status: None   Collection Time: 04/20/18  8:00 AM  Result Value Ref Range   Glucose-Capillary 70 70 - 99 mg/dL   Comment 1 Notify RN    Comment 2 Document in Chart   Glucose, capillary     Status: Abnormal   Collection Time: 04/20/18 11:13 AM  Result Value Ref Range   Glucose-Capillary 316 (H) 70 - 99 mg/dL   Comment 1 Notify RN    Comment 2 Document in Chart   Glucose, capillary     Status: Abnormal   Collection Time: 04/20/18  4:55 PM  Result Value Ref Range   Glucose-Capillary 251 (H) 70 - 99 mg/dL   Comment 1 Notify RN    Comment 2 Document in Chart   Glucose, capillary     Status: Abnormal   Collection Time: 04/20/18  8:36 PM  Result Value Ref Range   Glucose-Capillary 240 (H) 70 - 99 mg/dL   Comment 1 Notify RN    Comment 2 Document in Chart   Basic metabolic panel     Status: Abnormal   Collection Time: 04/21/18  6:01 AM  Result Value Ref Range   Sodium 141 135 - 145 mmol/L   Potassium 4.4 3.5 - 5.1 mmol/L   Chloride 98 98 - 111 mmol/L   CO2 33 (H) 22 - 32 mmol/L   Glucose, Bld 73 70 - 99 mg/dL   BUN 73 (H) 8 - 23 mg/dL   Creatinine, Ser 1.72 (H) 0.44 - 1.00 mg/dL   Calcium 9.2 8.9 - 10.3 mg/dL   GFR calc non Af Amer 27 (L) >60 mL/min   GFR calc Af Amer 32 (L) >60 mL/min   Anion gap 10 5 - 15    Comment: Performed at [facility], [address], [place], [place] [address]  Glucose, capillary     Status: Abnormal   Collection Time: 04/21/18  7:32 AM  Result Value Ref Range   Glucose-Capillary 44 (LL) 70 - 99 mg/dL   Comment 1 Notify RN    Comment 2 Document in Chart   Glucose, capillary     Status: Abnormal   Collection  Time: 04/21/18  7:48 AM  Result Value Ref Range   Glucose-Capillary 53 (L) 70 - 99 mg/dL   Comment 1 Notify RN    Comment 2 Document in Chart   Glucose, capillary     Status: Abnormal   Collection Time: 04/21/18  8:23 AM  Result Value Ref Range   Glucose-Capillary 110 (H) 70 - 99 mg/dL   Comment 1 Notify RN    Comment 2 Document in Chart     ABGS No results for input(s): PHART, PO2ART, TCO2, HCO3 in the last 72 hours.  Invalid input(s): PCO2 CULTURES Recent Results (from the past 240 hour(s))  MRSA PCR Screening     Status: Abnormal   Collection Time: 04/16/18  8:41 AM  Result Value Ref Range Status   MRSA by PCR POSITIVE (A) NEGATIVE Final    Comment:        The GeneXpert MRSA Assay (FDA approved for NASAL specimens only), is one component of a comprehensive MRSA colonization surveillance program. It is not intended to diagnose MRSA infection nor to guide or monitor treatment for MRSA infections. RESULT CALLED TO, READ BACK BY AND VERIFIED WITH: MARTIN D. AT 1216P ON [id number] BY THOMPSON S. Performed at [facility], [address], [place],  [address]    Studies/Results: No results found.  Medications:  Prior to Admission:  Medications Prior to Admission  Medication Sig Dispense Refill Last Dose  . ALPHAGAN P 0.1 % SOLN Place 1 drop into both eyes 2 (two) times daily.    04/14/2018 at Unknown time  . amLODipine (NORVASC) 5 MG tablet Take 5 mg by mouth every morning.    04/14/2018 at Unknown time  . anastrozole (ARIMIDEX) 1 MG tablet Take 1 mg by mouth daily.   04/14/2018 at Unknown time  . aspirin EC 81 MG tablet Take 81 mg by mouth daily.   04/14/2018 at Unknown time  . atorvastatin (LIPITOR) 10 MG tablet Take 10 mg by mouth every evening.    04/14/2018 at Unknown time  . Bacillus Coagulans-Inulin (PROBIOTIC FORMULA PO) Take 1 capsule by mouth daily.   04/14/2018 at Unknown time  . cyanocobalamin (,VITAMIN B-12,) 1000 MCG/ML  injection Inject 1,000 mcg into the  muscle every 30 (thirty) days.    04/14/2018 at Unknown time  . dorzolamide (TRUSOPT) 2 % ophthalmic solution Place 1 drop into both eyes 2 (two) times daily.   4 04/14/2018 at Unknown time  . EPIPEN 2-PAK 0.3 MG/0.3ML SOAJ injection Inject 0.3 mg into the muscle as needed for anaphylaxis.    unknown  . fulvestrant (FASLODEX) 250 MG/5ML injection Inject 500 mg into the muscle every 14 (fourteen) days. One injection each buttock over 1-2 minutes. Warm prior to use.   04/12/2018  . gabapentin (NEURONTIN) 600 MG tablet Take 600 mg by mouth daily.    04/14/2018 at Unknown time  . glipiZIDE (GLUCOTROL) 5 MG tablet Take 5 mg by mouth daily before breakfast.    04/14/2018 at Unknown time  . hydrALAZINE (APRESOLINE) 25 MG tablet Take 25-50 mg by mouth See admin instructions. Take 50 mg by mouth in the morning and take 25 mg by mouth at bedtime   04/14/2018 at Unknown time  . irbesartan (AVAPRO) 150 MG tablet Take 150 mg by mouth daily.   04/14/2018 at Unknown time  . latanoprost (XALATAN) 0.005 % ophthalmic solution Place 1 drop into both eyes at bedtime.    04/14/2018 at Unknown time  . metFORMIN (GLUCOPHAGE) 1000 MG tablet Take 1,000 mg by mouth daily.    04/14/2018 at Unknown time  . metoprolol succinate (TOPROL-XL) 100 MG 24 hr tablet Take 100 mg by mouth every morning. Take with or immediately following a meal.   04/14/2018 at 0800am  . mirtazapine (REMERON) 15 MG tablet Take 15 mg by mouth at bedtime.    04/14/2018 at Unknown time  . MYRBETRIQ 50 MG TB24 tablet Take 50 mg by mouth daily.    04/14/2018 at Unknown time  . omeprazole (PRILOSEC) 20 MG capsule Take 20 mg by mouth daily.   04/14/2018 at Unknown time  . Oxycodone HCl 10 MG TABS Take 10 mg by mouth daily.    04/14/2018 at Unknown time  . Polyethyl Glycol-Propyl Glycol (SYSTANE ULTRA) 0.4-0.3 % SOLN Place 1-2 drops into both eyes daily as needed (for itching eyes).   04/14/2018 at Unknown time  . torsemide (DEMADEX) 20 MG tablet Take 1 tablet (20 mg total) by  mouth daily. 30 tablet 5 04/14/2018 at Unknown time  . TRESIBA FLEXTOUCH 100 UNIT/ML SOPN FlexTouch Pen Inject 80 Units into the skin daily.    04/14/2018 at 0800am  . palbociclib (IBRANCE) 125 MG capsule Take 1 capsule (125 mg total) by mouth daily with breakfast. Take whole with food. Take for 21 days on, 7 days off, repeat every 28 days. (Patient not taking: Reported on 04/15/2018) 21 capsule 6 Not Taking at Unknown time   Scheduled: . amLODipine  5 mg Oral q morning - 10a  . anastrozole  1 mg Oral Daily  . aspirin EC  81 mg Oral Daily  . atorvastatin  10 mg Oral QPM  . brimonidine  1 drop Both Eyes BID  . budesonide (PULMICORT) nebulizer solution  0.25 mg Nebulization BID  . Chlorhexidine Gluconate Cloth  6 each Topical Q0600  . dextromethorphan-guaiFENesin  1 tablet Oral BID  . docusate sodium  100 mg Oral BID  . dorzolamide  1 drop Both Eyes BID  . doxycycline  100 mg Oral Q12H  . furosemide  40 mg Intravenous Daily  . gabapentin  600 mg Oral Daily  . hydrALAZINE  25 mg Oral QHS  . hydrALAZINE  50  mg Oral q morning - 10a  . insulin aspart  0-20 Units Subcutaneous TID WC  . insulin aspart  0-5 Units Subcutaneous QHS  . insulin aspart  5 Units Subcutaneous TID WC  . insulin glargine  80 Units Subcutaneous QHS  . ipratropium-albuterol  3 mL Nebulization TID  . irbesartan  150 mg Oral Daily  . latanoprost  1 drop Both Eyes QHS  . metoprolol succinate  100 mg Oral q morning - 10a  . mirabegron ER  50 mg Oral Daily  . mirtazapine  15 mg Oral QHS  . mupirocin ointment  1 application Nasal BID  . oxyCODONE  10 mg Oral Daily  . pantoprazole  40 mg Oral Daily  . predniSONE  40 mg Oral Q breakfast  . saccharomyces boulardii  250 mg Oral Daily  . sodium chloride flush  3 mL Intravenous Q12H   Continuous: . sodium chloride     WUJ:WJXBJY chloride, acetaminophen **OR** acetaminophen, albuterol, guaiFENesin-dextromethorphan, ondansetron **OR** ondansetron (ZOFRAN) IV, polyvinyl alcohol,  sodium chloride flush, traMADol  Assesment: She had acute on chronic diastolic heart failure and had acute bronchitis.  She is improving.  She is down approximately 10 kg.  She has chronic renal failure and her renal function had declined somewhat but is back closer to baseline now.  We may have to accept a creatinine of around 1.7-1.8 to allow her to have adequate diuresis  Her acute bronchitis is better but she is still coughing up sputum.  She feels like she will probably need a nebulizer at home.  She has had recent diagnosis of recurrence of breast cancer and she is doing okay with that  She has chronic back pain at home.  She has diabetes and her blood sugar was low this morning so I am going to back off some on her Lantus   Principal Problem:   Acute hypoxemic respiratory failure (HCC) Active Problems:   DM type 2 (diabetes mellitus, type 2) (HCC)   Hyperlipidemia   GERD (gastroesophageal reflux disease)   Essential hypertension, benign   Recurrent breast adenocarcinoma, left (HCC)   Acute on chronic diastolic CHF (congestive heart failure) ([place])    Plan: Switch to oral medications today.  Decrease Lantus.  Home tomorrow with home health services    LOS: 6 days   Alonza Bogus 04/21/2018, 9:20 AM

## 2018-04-22 LAB — GLUCOSE, CAPILLARY
Glucose-Capillary: 32 mg/dL — CL (ref 70–99)
Glucose-Capillary: 74 mg/dL (ref 70–99)
Glucose-Capillary: 352 mg/dL — ABNORMAL HIGH (ref 70–99)
Glucose-Capillary: 239 mg/dL — ABNORMAL HIGH (ref 70–99)
Glucose-Capillary: 183 mg/dL — ABNORMAL HIGH (ref 70–99)

## 2018-04-22 MED ORDER — INSULIN GLARGINE 100 UNIT/ML ~~LOC~~ SOLN
60.0000 [IU] | Freq: Every day | SUBCUTANEOUS | Status: DC
  Administered 2018-04-22: 60 [IU] via SUBCUTANEOUS
  Filled 2018-04-22 (×2): qty 0.6

## 2018-04-22 NOTE — Progress Notes (Signed)
Physical Therapy Treatment Patient Details Name: [name] MRN: [id number] DOB: 19-Jun-1933 Today's Date: 04/22/2018    History of Present Illness [name] is a 85 y.o. female with medical history significant for type 2 diabetes, hypertension, GERD, dyslipidemia, and recurrent breast cancer stage III that she was recently rediagnosed with.  She presents to the ED with worsening shortness of breath along with dry cough over the last 2 days and received nebulizer treatment and IV steroids in route via EMS.  She has been given a nebulized treatment as well as some IV Lasix.  She is noted to have some bilateral lower extremity edema and has not been checking her weight.  She denies any orthopnea, chest pain, fever, or chills.  She has been taking her other medications as usual.  Daughter at bedside does state that patient has been in and out of hospitals quite frequently recently due to the recent cancer diagnosis.    PT Comments    Patient demonstrates increased endurance/distance for gait training without loss of balance while leaning on handles of her rollator. Patient demonstrates increased BLE strength for sit to stands and bed/chair/BSC transfers using armrests.  Patient continued sitting up in chair after therapy.  Patient will benefit from continued physical therapy in hospital and recommended venue below to increase strength, balance, endurance for safe ADLs and gait.    Follow Up Recommendations  Home health PT;Supervision - Intermittent     Equipment Recommendations  Rolling walker with 5" wheels    Recommendations for Other Services       Precautions / Restrictions Precautions Precautions: Fall Restrictions Weight Bearing Restrictions: No    Mobility  Bed Mobility               General bed mobility comments: presents up in chair (assisted by nursing staff)  Transfers Overall transfer level: Needs assistance Equipment used: Rolling walker (2  wheeled);None   Sit to Stand: Modified independent (Device/Increase time) Stand pivot transfers: Supervision       General transfer comment: demonstrates safe chair/BSC transfers using armrests, no loss of balance  Ambulation/Gait Ambulation/Gait assistance: Supervision Gait Distance (Feet): 85 Feet Assistive device: 4-wheeled walker Gait Pattern/deviations: Decreased step length - right;Decreased step length - left;Decreased stride length Gait velocity: decreased   General Gait Details: increased endurance/distance for ambulation with slow slightly labored cadence while leaning elbows on handles of rollator without loss of balance   Stairs             Wheelchair Mobility    Modified Rankin (Stroke Patients Only)       Balance Overall balance assessment: Needs assistance Sitting-balance support: Feet supported;Bilateral upper extremity supported Sitting balance-Leahy Scale: Good     Standing balance support: Bilateral upper extremity supported;During functional activity Standing balance-Leahy Scale: Fair Standing balance comment: fair/good using Rollator                            Cognition Arousal/Alertness: Awake/alert Behavior During Therapy: WFL for tasks assessed/performed Overall Cognitive Status: Within Functional Limits for tasks assessed                                        Exercises General Exercises - Lower Extremity Ankle Circles/Pumps: Seated;AROM;Both;10 reps;Strengthening Long Arc Quad: Seated;Strengthening;AROM;Both;10 reps Hip Flexion/Marching: Seated;Strengthening;AROM;Both;10 reps    General Comments  Pertinent Vitals/Pain Pain Assessment: No/denies pain    Home Living                      Prior Function            PT Goals (current goals can now be found in the care plan section) Acute Rehab PT Goals Patient Stated Goal: return home with family to assist PT Goal Formulation: With  patient Time For Goal Achievement: 04/24/18 Potential to Achieve Goals: Good Progress towards PT goals: Progressing toward goals    Frequency    Min 3X/week      PT Plan Current plan remains appropriate    Co-evaluation              AM-PAC PT "6 Clicks" Mobility   Outcome Measure  Help needed turning from your back to your side while in a flat bed without using bedrails?: None Help needed moving from lying on your back to sitting on the side of a flat bed without using bedrails?: None Help needed moving to and from a bed to a chair (including a wheelchair)?: None Help needed standing up from a chair using your arms (e.g., wheelchair or bedside chair)?: None Help needed to walk in hospital room?: A Little Help needed climbing 3-5 steps with a railing? : A Lot 6 Click Score: 21    End of Session   Activity Tolerance: Patient tolerated treatment well;Patient limited by fatigue Patient left: in chair;with call bell/phone within reach Nurse Communication: Mobility status PT Visit Diagnosis: Unsteadiness on feet (R26.81);Other abnormalities of gait and mobility (R26.89);Muscle weakness (generalized) (M62.81)     Time: 8616-8372 PT Time Calculation (min) (ACUTE ONLY): 29 min  Charges:  $Gait Training: 8-22 mins $Therapeutic Exercise: 8-22 mins                     2:23 PM, 04/22/18 Lonell Grandchild, MPT Physical Therapist with [facility] [phone] office [phone] mobile phone

## 2018-04-22 NOTE — Care Management Note (Signed)
Case Management Note  Patient Details  Name: [name] MRN: [id number] Date of Birth: 1933/09/11  Subjective/Objective:                    Action/Plan: Patient will need neb machine. Elects [facility] as she has used them previously for DME. Vaughan Basta of [facility] notified and will deliver neb to room.     Expected Discharge Date:     04/23/18             Expected Discharge Plan:  [place]  In-House Referral:     Discharge planning Services  CM Consult  Post Acute Care Choice:  Home Health Choice offered to:  Patient  DME Arranged:  Nebulizer machine DME Agency:  [place]:  PT [facility] Agency:  [place]  Status of Service:  Completed, signed off  If discussed at [place] of Stay Meetings, dates discussed:    Additional Comments:  Sevon Rotert, Chauncey [place], RN 04/22/2018, 1:04 PM

## 2018-04-22 NOTE — Progress Notes (Signed)
Subjective: She says she feels okay.  Her blood sugar dropped to 32 this morning despite reduction in the dose of her insulin.  She has pretty poorly controlled diabetes at baseline and her insulin dose has been increased to try to get her A1c better.  She did take a bedtime snack last night but it did not help  Objective: Vital signs in last 24 hours: Temp:  [97.4 F (36.3 C)-98.1 F (36.7 C)] 97.4 F (36.3 C) (02/24 8182) Pulse Rate:  [57-65] 57 (02/24 0613) Resp:  [16-17] 17 (02/24 0613) BP: (104-136)/(60-68) 136/62 (02/24 0613) SpO2:  [93 %-98 %] 96 % (02/24 0730) Weight:  [81.4 kg] 81.4 kg (02/24 0500) Weight change: -8.5 kg Last BM Date: 04/20/18  Intake/Output from previous day: 02/23 0701 - 02/24 0700 In: 1203 [P.O.:1200; I.V.:3] Out: 1150 [Urine:1150]  PHYSICAL EXAM General appearance: alert, cooperative and no distress Resp: rhonchi bilaterally Cardio: regular rate and rhythm, S1, S2 normal, no murmur, click, rub or gallop GI: soft, non-tender; bowel sounds normal; no masses,  no organomegaly Extremities: extremities normal, atraumatic, no cyanosis or edema  Lab Results:  Results for orders placed or performed during the hospital encounter of 04/15/18 (from the past 48 hour(s))  Glucose, capillary     Status: Abnormal   Collection Time: 04/20/18 11:13 AM  Result Value Ref Range   Glucose-Capillary 316 (H) 70 - 99 mg/dL   Comment 1 Notify RN    Comment 2 Document in Chart   Glucose, capillary     Status: Abnormal   Collection Time: 04/20/18  4:55 PM  Result Value Ref Range   Glucose-Capillary 251 (H) 70 - 99 mg/dL   Comment 1 Notify RN    Comment 2 Document in Chart   Glucose, capillary     Status: Abnormal   Collection Time: 04/20/18  8:36 PM  Result Value Ref Range   Glucose-Capillary 240 (H) 70 - 99 mg/dL   Comment 1 Notify RN    Comment 2 Document in Chart   Basic metabolic panel     Status: Abnormal   Collection Time: 04/21/18  6:01 AM  Result Value  Ref Range   Sodium 141 135 - 145 mmol/L   Potassium 4.4 3.5 - 5.1 mmol/L   Chloride 98 98 - 111 mmol/L   CO2 33 (H) 22 - 32 mmol/L   Glucose, Bld 73 70 - 99 mg/dL   BUN 73 (H) 8 - 23 mg/dL   Creatinine, Ser 1.72 (H) 0.44 - 1.00 mg/dL   Calcium 9.2 8.9 - 10.3 mg/dL   GFR calc non Af Amer 27 (L) >60 mL/min   GFR calc Af Amer 32 (L) >60 mL/min   Anion gap 10 5 - 15    Comment: Performed at [facility], [address] [place], [place] [address]  Glucose, capillary     Status: Abnormal   Collection Time: 04/21/18  7:32 AM  Result Value Ref Range   Glucose-Capillary 44 (LL) 70 - 99 mg/dL   Comment 1 Notify RN    Comment 2 Document in Chart   Glucose, capillary     Status: Abnormal   Collection Time: 04/21/18  7:48 AM  Result Value Ref Range   Glucose-Capillary 53 (L) 70 - 99 mg/dL   Comment 1 Notify RN    Comment 2 Document in Chart   Glucose, capillary     Status: Abnormal   Collection Time: 04/21/18  8:23 AM  Result Value Ref Range  Glucose-Capillary 110 (H) 70 - 99 mg/dL   Comment 1 Notify RN    Comment 2 Document in Chart   Glucose, capillary     Status: Abnormal   Collection Time: 04/21/18 11:42 AM  Result Value Ref Range   Glucose-Capillary 277 (H) 70 - 99 mg/dL   Comment 1 Notify RN    Comment 2 Document in Chart   Glucose, capillary     Status: Abnormal   Collection Time: 04/21/18  4:50 PM  Result Value Ref Range   Glucose-Capillary 139 (H) 70 - 99 mg/dL   Comment 1 Notify RN    Comment 2 Document in Chart   Glucose, capillary     Status: None   Collection Time: 04/21/18  9:54 PM  Result Value Ref Range   Glucose-Capillary 96 70 - 99 mg/dL   Comment 1 Notify RN    Comment 2 Document in Chart   Glucose, capillary     Status: Abnormal   Collection Time: 04/22/18  7:34 AM  Result Value Ref Range   Glucose-Capillary 32 (LL) 70 - 99 mg/dL   Comment 1 Notify RN    Comment 2 Document in Chart    Comment 3 Repeat Test   Glucose, capillary     Status: None    Collection Time: 04/22/18  8:13 AM  Result Value Ref Range   Glucose-Capillary 74 70 - 99 mg/dL    ABGS No results for input(s): PHART, PO2ART, TCO2, HCO3 in the last 72 hours.  Invalid input(s): PCO2 CULTURES Recent Results (from the past 240 hour(s))  MRSA PCR Screening     Status: Abnormal   Collection Time: 04/16/18  8:41 AM  Result Value Ref Range Status   MRSA by PCR POSITIVE (A) NEGATIVE Final    Comment:        The GeneXpert MRSA Assay (FDA approved for NASAL specimens only), is one component of a comprehensive MRSA colonization surveillance program. It is not intended to diagnose MRSA infection nor to guide or monitor treatment for MRSA infections. RESULT CALLED TO, READ BACK BY AND VERIFIED WITH: MARTIN D. AT 1216P ON [id number] BY THOMPSON S. Performed at [facility], [address], [place], [place] [address]    Studies/Results: No results found.  Medications:  Prior to Admission:  Medications Prior to Admission  Medication Sig Dispense Refill Last Dose  . ALPHAGAN P 0.1 % SOLN Place 1 drop into both eyes 2 (two) times daily.    04/14/2018 at Unknown time  . amLODipine (NORVASC) 5 MG tablet Take 5 mg by mouth every morning.    04/14/2018 at Unknown time  . anastrozole (ARIMIDEX) 1 MG tablet Take 1 mg by mouth daily.   04/14/2018 at Unknown time  . aspirin EC 81 MG tablet Take 81 mg by mouth daily.   04/14/2018 at Unknown time  . atorvastatin (LIPITOR) 10 MG tablet Take 10 mg by mouth every evening.    04/14/2018 at Unknown time  . Bacillus Coagulans-Inulin (PROBIOTIC FORMULA PO) Take 1 capsule by mouth daily.   04/14/2018 at Unknown time  . cyanocobalamin (,VITAMIN B-12,) 1000 MCG/ML injection Inject 1,000 mcg into the muscle every 30 (thirty) days.    04/14/2018 at Unknown time  . dorzolamide (TRUSOPT) 2 % ophthalmic solution Place 1 drop into both eyes 2 (two) times daily.   4 04/14/2018 at Unknown time  . EPIPEN 2-PAK 0.3 MG/0.3ML SOAJ injection Inject 0.3 mg into the  muscle as needed for anaphylaxis.  unknown  . fulvestrant (FASLODEX) 250 MG/5ML injection Inject 500 mg into the muscle every 14 (fourteen) days. One injection each buttock over 1-2 minutes. Warm prior to use.   04/12/2018  . gabapentin (NEURONTIN) 600 MG tablet Take 600 mg by mouth daily.    04/14/2018 at Unknown time  . glipiZIDE (GLUCOTROL) 5 MG tablet Take 5 mg by mouth daily before breakfast.    04/14/2018 at Unknown time  . hydrALAZINE (APRESOLINE) 25 MG tablet Take 25-50 mg by mouth See admin instructions. Take 50 mg by mouth in the morning and take 25 mg by mouth at bedtime   04/14/2018 at Unknown time  . irbesartan (AVAPRO) 150 MG tablet Take 150 mg by mouth daily.   04/14/2018 at Unknown time  . latanoprost (XALATAN) 0.005 % ophthalmic solution Place 1 drop into both eyes at bedtime.    04/14/2018 at Unknown time  . metFORMIN (GLUCOPHAGE) 1000 MG tablet Take 1,000 mg by mouth daily.    04/14/2018 at Unknown time  . metoprolol succinate (TOPROL-XL) 100 MG 24 hr tablet Take 100 mg by mouth every morning. Take with or immediately following a meal.   04/14/2018 at 0800am  . mirtazapine (REMERON) 15 MG tablet Take 15 mg by mouth at bedtime.    04/14/2018 at Unknown time  . MYRBETRIQ 50 MG TB24 tablet Take 50 mg by mouth daily.    04/14/2018 at Unknown time  . omeprazole (PRILOSEC) 20 MG capsule Take 20 mg by mouth daily.   04/14/2018 at Unknown time  . Oxycodone HCl 10 MG TABS Take 10 mg by mouth daily.    04/14/2018 at Unknown time  . Polyethyl Glycol-Propyl Glycol (SYSTANE ULTRA) 0.4-0.3 % SOLN Place 1-2 drops into both eyes daily as needed (for itching eyes).   04/14/2018 at Unknown time  . torsemide (DEMADEX) 20 MG tablet Take 1 tablet (20 mg total) by mouth daily. 30 tablet 5 04/14/2018 at Unknown time  . TRESIBA FLEXTOUCH 100 UNIT/ML SOPN FlexTouch Pen Inject 80 Units into the skin daily.    04/14/2018 at 0800am  . palbociclib (IBRANCE) 125 MG capsule Take 1 capsule (125 mg total) by mouth daily with  breakfast. Take whole with food. Take for 21 days on, 7 days off, repeat every 28 days. (Patient not taking: Reported on 04/15/2018) 21 capsule 6 Not Taking at Unknown time   Scheduled: . amLODipine  5 mg Oral q morning - 10a  . anastrozole  1 mg Oral Daily  . aspirin EC  81 mg Oral Daily  . atorvastatin  10 mg Oral QPM  . brimonidine  1 drop Both Eyes BID  . budesonide (PULMICORT) nebulizer solution  0.25 mg Nebulization BID  . Chlorhexidine Gluconate Cloth  6 each Topical Q0600  . dextromethorphan-guaiFENesin  1 tablet Oral BID  . docusate sodium  100 mg Oral BID  . dorzolamide  1 drop Both Eyes BID  . doxycycline  100 mg Oral Q12H  . furosemide  40 mg Oral Daily  . gabapentin  600 mg Oral Daily  . hydrALAZINE  25 mg Oral QHS  . hydrALAZINE  50 mg Oral q morning - 10a  . insulin aspart  0-20 Units Subcutaneous TID WC  . insulin aspart  5 Units Subcutaneous TID WC  . insulin glargine  60 Units Subcutaneous QHS  . ipratropium-albuterol  3 mL Nebulization TID  . irbesartan  150 mg Oral Daily  . latanoprost  1 drop Both Eyes QHS  . metoprolol succinate  100 mg Oral q morning - 10a  . mirabegron ER  50 mg Oral Daily  . mirtazapine  15 mg Oral QHS  . mupirocin ointment  1 application Nasal BID  . oxyCODONE  10 mg Oral Daily  . pantoprazole  40 mg Oral Daily  . predniSONE  40 mg Oral Q breakfast  . saccharomyces boulardii  250 mg Oral Daily  . sodium chloride flush  3 mL Intravenous Q12H   Continuous: . sodium chloride     AVW:UJWJXB chloride, acetaminophen **OR** acetaminophen, albuterol, guaiFENesin-dextromethorphan, ondansetron **OR** ondansetron (ZOFRAN) IV, polyvinyl alcohol, sodium chloride flush, traMADol  Assesment: She was admitted to the hospital with acute hypoxic respiratory failure which is from a combination of acute on chronic diastolic heart failure and acute bronchitis and influenza A.  She is substantially better.  She has had excellent diuresis.  I switched her to  oral medications yesterday.  She has diabetes which has not been well controlled at home but she has had hypoglycemia here possibly because she has had better diet.  She has hypertension which is well controlled  She has weakness but she is doing better  She has had recent recurrence of her breast cancer Principal Problem:   Acute hypoxemic respiratory failure (HCC) Active Problems:   DM type 2 (diabetes mellitus, type 2) ([place])   Hyperlipidemia   GERD (gastroesophageal reflux disease)   Essential hypertension, benign   Recurrent breast adenocarcinoma, left (HCC)   Acute on chronic diastolic CHF (congestive heart failure) ([place])    Plan: I do not think she can be sent home safely at this point.  I am concerned about hypoglycemia at home.  I will reduce her insulin again.  Request diabetic teaching.  Probable discharge in the morning    LOS: 7 days   [name] 04/22/2018, 8:35 AM

## 2018-04-22 NOTE — Care Management Important Message (Signed)
Important Message  Patient Details  Name: NIA NATHANIEL MRN: [id number] Date of Birth: 1933/07/01   Medicare Important Message Given:  Yes    Inez Stantz, Chauncey Reading, RN 04/22/2018, 2:01 PM

## 2018-04-22 NOTE — Progress Notes (Signed)
Inpatient Diabetes Program Recommendations  AACE/ADA: New Consensus Statement on Inpatient Glycemic Control (2015)  Target Ranges:  Prepandial:   less than 140 mg/dL      Peak postprandial:   less than 180 mg/dL (1-2 hours)      Critically ill patients:  140 - 180 mg/dL   Lab Results  Component Value Date   GLUCAP 74 04/22/2018   HGBA1C 9.1 (H) 07/25/2017    Review of Glycemic Control Results for BETSAIDA, [place] (MRN [id number]) as of 04/22/2018 10:03  Ref. Range 04/21/2018 07:32 04/21/2018 07:48 04/21/2018 08:23 04/21/2018 11:42 04/21/2018 16:50 04/21/2018 21:54 04/22/2018 07:34 04/22/2018 08:13  Glucose-Capillary Latest Ref Range: 70 - 99 mg/dL 44 (LL) 53 (L) 110 (H) 277 (H) 5 units MC 11 units correction 139 (H) 5 units MC 3 units correction 96 32 (LL) 74   Diabetes history: DM2 Outpatient Diabetes medications: Tresiba 80 units + Glucotrol 5 mg qd + Metformin 1 gm qd Current orders for Inpatient glycemic control: Lantus 60 units + Novolog 5 units tid meal coverage if eats 50% + Novolog resistant tid + hs 0-5 units  Inpatient Diabetes Program Recommendations:   -Decrease Novolog correction to sensitive tid + hs 0-5 units -Increase meal coverage to 6 tid if eats 50%  Lunch CBGs are elevated due to no meal coverage given post hypoglycemia and elevates with meal, then hypoglycemia post resistant correction.  Thank you, Nani Gasser. Amanuel Sinkfield, RN, MSN, CDE  Diabetes Coordinator Inpatient Glycemic Control Team Team Pager [phone] (8am-5pm) 04/22/2018 10:09 AM

## 2018-04-23 LAB — GLUCOSE, CAPILLARY
Glucose-Capillary: 189 mg/dL — ABNORMAL HIGH (ref 70–99)
Glucose-Capillary: 57 mg/dL — ABNORMAL LOW (ref 70–99)
Glucose-Capillary: 178 mg/dL — ABNORMAL HIGH (ref 70–99)

## 2018-04-23 MED ORDER — GLUCOSE 40 % PO GEL
ORAL | Status: AC
  Administered 2018-04-23: 08:00:00
  Filled 2018-04-23: qty 1

## 2018-04-23 MED ORDER — TRESIBA FLEXTOUCH 100 UNIT/ML ~~LOC~~ SOPN: 50.0000 [IU] | PEN_INJECTOR | Freq: Every day | SUBCUTANEOUS | 5 refills | Status: AC

## 2018-04-23 MED ORDER — IPRATROPIUM-ALBUTEROL 0.5-2.5 (3) MG/3ML IN SOLN: 3.0000 mL | Freq: Three times a day (TID) | RESPIRATORY_TRACT | 3 refills | Status: AC

## 2018-04-23 MED ORDER — GUAIFENESIN-DM 100-10 MG/5ML PO SYRP: 5.0000 mL | ORAL_SOLUTION | ORAL | 0 refills | Status: DC | PRN

## 2018-04-23 MED ORDER — INSULIN GLARGINE 100 UNIT/ML ~~LOC~~ SOLN
50.0000 [IU] | Freq: Every day | SUBCUTANEOUS | Status: DC
  Administered 2018-04-23: 50 [IU] via SUBCUTANEOUS
  Filled 2018-04-23 (×2): qty 0.5

## 2018-04-23 NOTE — Care Management Note (Signed)
Case Management Note  Patient Details  Name: EMMAGENE ORTNER MRN: [id number] Date of Birth: 1934-02-24  Subjective/Objective:                    Action/Plan: Patient discharging home today. Vaughan Basta of [facility] notified and will obtain orders via Epic.   Expected Discharge Date:  04/23/18               Expected Discharge Plan:  [place]  In-House Referral:     Discharge planning Services  CM Consult  Post Acute Care Choice:  Home Health Choice offered to:  Patient  DME Arranged:  Nebulizer machine DME Agency:  [place]:  PT [facility] Agency:  [place]  Status of Service:  Completed, signed off  If discussed at [place] of Stay Meetings, dates discussed:    Additional Comments:  Thomas Mabry, Chauncey Reading, RN 04/23/2018, 10:07 AM

## 2018-04-23 NOTE — Progress Notes (Signed)
Her blood sugar dropped into the 50s this morning but she otherwise feels well.  I have adjusted her insulin again but I think she is okay to go home.  Her breathing is good.  She is getting stronger.  We will plan to discharge today

## 2018-04-23 NOTE — Progress Notes (Signed)
Inpatient Diabetes Program Recommendations  AACE/ADA: New Consensus Statement on Inpatient Glycemic Control  Target Ranges:  Prepandial:   less than 140 mg/dL      Peak postprandial:   less than 180 mg/dL (1-2 hours)      Critically ill patients:  140 - 180 mg/dL   Results for MAHATI, VAJDA (MRN [id number]) as of 04/23/2018 06:19  Ref. Range 04/22/2018 07:34 04/22/2018 08:13 04/22/2018 11:34 04/22/2018 16:37 04/22/2018 22:31  Glucose-Capillary Latest Ref Range: 70 - 99 mg/dL 32 (LL) 74 352 (H)  Novolog 25 units 239 (H)  Novolog 5 units 183 (H)  Lantus 60 units  Results for AIBHLINN, KALMAR (MRN [id number]) as of 04/23/2018 06:19  Ref. Range 04/21/2018 07:32 04/21/2018 07:48 04/21/2018 08:23 04/21/2018 11:42 04/21/2018 16:50 04/21/2018 21:54  Glucose-Capillary Latest Ref Range: 70 - 99 mg/dL 44 (LL) 53 (L) 110 (H) 277 (H) 139 (H) 96  Lantus 70 units   Review of Glycemic Control  Diabetes history: DM2 hx Outpatient Diabetes medications: Tresiba 80 units daily, Glipizide 5 mg daily, Metformin 1000 mg daily Current orders for Inpatient glycemic control: Lantus 60 units QHS, Novolog 5 units TID with meals; Prednisone 40 mg QAM  Inpatient Diabetes Program Recommendations:  Insulin - Basal: Noted Lantus was decreased from 70 to 60 units on 04/22/18 due to hypoglycemia. If fasting glucose is less than 80 mg/dl, please decrease Lantus further to 50 units QHS. Correction (SSI): Please consider re-ordering Novolog correction scale; recommend Novolog 0-9 units TID with meals and Novolog 0-5 units QHS. Insulin - Meal Coverage: If steroids are continued, please consider increasing meal coverage to Novolog 8 units TID with meals if patient eats at least 50% of meals.  Note: Noted consult for Diabetes Coordinator. Spoke with patient about diabetes and home regimen for diabetes control. Patient reports that she is followed by PCP for diabetes management and currently she takes [place] 80 units daily, Glipizide  5 mg daily, and Metformin 1000 mg daily as an outpatient for diabetes control. Patient reports that she is taking all DM medications exactly as prescribed.  Patient states she takes the same dose of Tresiba daily despite glucose values as she takes as Dr. Luan Pulling directs.  Patient states that she usually checks glucose once a day in the mornings (fasting) and notes that she experiences hypoglycemia 2-3 times per week (usually during the night she is awakened with symptoms of hypoglycemia).   Patient reports that if she wakes up feeling like her glucose is too low she goes to the kitchen to get her something to drink and uses glucose tablets as well if needed.  Patient reports that she is planning to go stay with her daughter for a while until she feels stronger. Patient notes that her daughter works from home and her son-in-law is disabled and is at home all the time as well.  Discussed current insulin orders and explained that she has been receiving steroids which are contributing to higher glucose values especially during the day (post prandial).  Explained that Lantus has been decreased daily over the past couple of days due to fasting hypoglycemia.  Explained that Dr. Luan Pulling plans to decrease home dose of Tresiba to 50 units and is stopping Metformin.  Patient states she is aware that he was going to decrease [place] dose and stop Metformin.  Encouraged patient to check her glucose 3-4 times per day (before meals and at bedtime) and to keep a log book of glucose readings.   Encouraged patient  to be sure to keep glucose tablets at bedside in case she awakens with symptoms of hypoglycemia. Encouraged patient to reach out to Dr. Luan Pulling if she has any further issues with hypoglycemia so that additional changes can be made with outpatient DM medications. Asked patient to be sure to follow up with Dr. Luan Pulling as directed and to take her glucometer or glucose log with her to her follow up appointments.  Patient  verbalized understanding of information discussed and she states that she has no further questions at this time related to diabetes.   Thanks, Barnie Alderman, RN, MSN, CDE Diabetes Coordinator Inpatient Diabetes Program [phone] (Team Pager from 8am to 5pm)

## 2018-04-23 NOTE — Discharge Summary (Signed)
Physician Discharge Summary  Patient ID: [name] MRN: [id number] DOB/AGE: 1933/11/08 85 y.o. Primary Care Physician:Samil Mecham, Percell Miller, MD Admit date: 04/15/2018 Discharge date: 04/23/2018    Discharge Diagnoses:   Principal Problem:   Acute hypoxemic respiratory failure ([place]) Active Problems:   DM type 2 (diabetes mellitus, type 2) (HCC)   Hyperlipidemia   GERD (gastroesophageal reflux disease)   Essential hypertension, benign   Recurrent breast adenocarcinoma, left (HCC)   Acute on chronic diastolic CHF (congestive heart failure) (HCC) Muscular weakness/deconditioning Hypoglycemia Allergies as of 04/23/2018      Reactions   Bee Venom Anaphylaxis, Swelling   Hydromorphone Hcl Other (See Comments)   Agitation, Confusion   Demerol Nausea Only      Medication List    STOP taking these medications   anastrozole 1 MG tablet Commonly known as:  ARIMIDEX   irbesartan 150 MG tablet Commonly known as:  AVAPRO   metFORMIN 1000 MG tablet Commonly known as:  GLUCOPHAGE   palbociclib 125 MG capsule Commonly known as:  IBRANCE     TAKE these medications   ALPHAGAN P 0.1 % Soln Generic drug:  brimonidine Place 1 drop into both eyes 2 (two) times daily.   amLODipine 5 MG tablet Commonly known as:  NORVASC Take 5 mg by mouth every morning.   aspirin EC 81 MG tablet Take 81 mg by mouth daily.   atorvastatin 10 MG tablet Commonly known as:  LIPITOR Take 10 mg by mouth every evening.   cyanocobalamin 1000 MCG/ML injection Commonly known as:  (VITAMIN B-12) Inject 1,000 mcg into the muscle every 30 (thirty) days.   dorzolamide 2 % ophthalmic solution Commonly known as:  TRUSOPT Place 1 drop into both eyes 2 (two) times daily.   EPIPEN 2-PAK 0.3 mg/0.3 mL Soaj injection Generic drug:  EPINEPHrine Inject 0.3 mg into the muscle as needed for anaphylaxis.   fulvestrant 250 MG/5ML injection Commonly known as:  FASLODEX Inject 500 mg into the muscle every 14  (fourteen) days. One injection each buttock over 1-2 minutes. Warm prior to use.   gabapentin 600 MG tablet Commonly known as:  NEURONTIN Take 600 mg by mouth daily.   glipiZIDE 5 MG tablet Commonly known as:  GLUCOTROL Take 5 mg by mouth daily before breakfast.   guaiFENesin-dextromethorphan 100-10 MG/5ML syrup Commonly known as:  ROBITUSSIN DM Take 5 mLs by mouth every 4 (four) hours as needed for cough.   hydrALAZINE 25 MG tablet Commonly known as:  APRESOLINE Take 25-50 mg by mouth See admin instructions. Take 50 mg by mouth in the morning and take 25 mg by mouth at bedtime   ipratropium-albuterol 0.5-2.5 (3) MG/3ML Soln Commonly known as:  DUONEB Take 3 mLs by nebulization 3 (three) times daily.   latanoprost 0.005 % ophthalmic solution Commonly known as:  XALATAN Place 1 drop into both eyes at bedtime.   metoprolol succinate 100 MG 24 hr tablet Commonly known as:  TOPROL-XL Take 100 mg by mouth every morning. Take with or immediately following a meal.   mirtazapine 15 MG tablet Commonly known as:  REMERON Take 15 mg by mouth at bedtime.   MYRBETRIQ 50 MG Tb24 tablet Generic drug:  mirabegron ER Take 50 mg by mouth daily.   omeprazole 20 MG capsule Commonly known as:  PRILOSEC Take 20 mg by mouth daily.   Oxycodone HCl 10 MG Tabs Take 10 mg by mouth daily.   PROBIOTIC FORMULA PO Take 1 capsule by mouth daily.  SYSTANE ULTRA 0.4-0.3 % Soln Generic drug:  Polyethyl Glycol-Propyl Glycol Place 1-2 drops into both eyes daily as needed (for itching eyes).   torsemide 20 MG tablet Commonly known as:  DEMADEX Take 1 tablet (20 mg total) by mouth daily.   TRESIBA FLEXTOUCH 100 UNIT/ML Sopn FlexTouch Pen Generic drug:  insulin degludec Inject 0.5 mLs (50 Units total) into the skin daily. What changed:  how much to take            Durable Medical Equipment  (From admission, onward)         Start     Ordered   04/22/18 0925  For home use only DME  Nebulizer machine  Once    Question:  Patient needs a nebulizer to treat with the following condition  Answer:  Respiratory failure ([place])   04/21/18 0925          Discharged Condition: Improved    Consults: None  Significant Diagnostic Studies: Dg Chest 2 View  Result Date: 04/15/2018 CLINICAL DATA:  Shortness of breath/hypoxia. EXAM: CHEST - 2 VIEW COMPARISON:  08/17/2017 and chest CT 04/11/2018 FINDINGS: Lungs are somewhat hypoinflated demonstrate hazy prominence of the central bronchovascular markings without focal lobar consolidation or effusion. Cardiomediastinal silhouette and remainder of the exam is unchanged. IMPRESSION: Hypoinflation with hazy prominence of the bilateral central bronchovascular markings which may be due to mild interstitial edema versus acute bronchitic process. Electronically Signed   By: Marin Olp M.D.   On: 04/15/2018 09:50   Ct Chest Wo Contrast  Result Date: 04/12/2018 CLINICAL DATA:  Breast cancer. Staging. Recurrent left breast cancer with apparent spread to skin/chest wall. EXAM: CT CHEST WITHOUT CONTRAST TECHNIQUE: Multidetector CT imaging of the chest was performed following the standard protocol without IV contrast. COMPARISON:  Today's bone scan, dictated separately. Chest radiograph 08/17/2017. Abdominal CT of 04/10/2011. Clinic note of 04/03/2018 FINDINGS: Cardiovascular: Aortic atherosclerosis. Tortuous thoracic aorta. Moderate cardiomegaly, without pericardial effusion. Multivessel coronary artery atherosclerosis. Mediastinum/Nodes: Multiple small left axillary nodes. None are pathologic by size criteria. A right axillary node is upper normal sized at 10 mm on image 29/3. No mediastinal or definite hilar adenopathy, given limitations of unenhanced CT. Lungs/Pleura: No pleural fluid. Bibasilar scarring. No suspicious pulmonary nodule or mass. Upper Abdomen: Cholecystectomy. Advanced cirrhosis. Splenomegaly, with a maximal transverse dimension of 16.4  cm. Vague low-density within the medial spleen of 1.7 cm was present in 2013 and of no clinical significance. Normal imaged portions of the stomach, pancreas, adrenal glands, kidneys. Abdominal aortic atherosclerosis. Multiple small retroperitoneal nodes, none pathologic by size criteria. Musculoskeletal: Left breast skin thickening. Retroareolar soft tissue fullness including on image 66/3. Left breast nodularity including laterally at 11 mm on image 71/3 and 12 mm on image 81/3. Mild T12 compression deformity. Surgical changes about the upper lumbar spine, incompletely imaged. IMPRESSION: 1. Left breast skin thickening and nodularity, nonspecific in the setting of prior therapy. 2. Borderline right axillary adenopathy. Multiple left axillary nodes, none pathologic by size criteria. 3. Cirrhosis and splenomegaly, suboptimally evaluated. 4. Coronary artery atherosclerosis. Aortic Atherosclerosis (ICD10-I70.0). Electronically Signed   By: Abigail Miyamoto M.D.   On: 04/12/2018 08:21   Nm Bone Scan Whole Body  Result Date: 04/12/2018 CLINICAL DATA:  Breast cancer .  Evaluate for metastatic disease. EXAM: NUCLEAR MEDICINE WHOLE BODY BONE SCAN TECHNIQUE: Whole body anterior and posterior images were obtained approximately 3 hours after intravenous injection of radiopharmaceutical. RADIOPHARMACEUTICALS:  20.6 mCi Technetium-26m MDP IV COMPARISON:  Chest CT 04/11/2018  FINDINGS: Focal intense uptake within a large portion of the the T12 vertebral body. Small focus of uptake in the RIGHT aspect T11 vertebral body. Findings respond to severe degenerate change associated with the posterior thoraco lumbar fusion partially imaged on chest CT 04/11/2018 More mild posterior uptake within the lumbar vertebral bodies is a degenerative pattern. Intense uptake in the LEFT ankle. IMPRESSION: 1. Uptake in the T12 vertebral body related to thoracolumbar fusion and severe disc osteophytic disease seen on comparison CT. 2. Intense uptake  in the LEFT ankle presumably represents trauma. Recommend radiographs of the LEFT ankle additionally. Electronically Signed   By: Suzy Bouchard M.D.   On: 04/12/2018 11:15    Lab Results: Basic Metabolic Panel: Recent Labs    04/21/18 0601  NA 141  K 4.4  CL 98  CO2 33*  GLUCOSE 73  BUN 73*  CREATININE 1.72*  CALCIUM 9.2   Liver Function Tests: No results for input(s): AST, ALT, ALKPHOS, BILITOT, PROT, ALBUMIN in the last 72 hours.   CBC: No results for input(s): WBC, NEUTROABS, HGB, HCT, MCV, PLT in the last 72 hours.  Recent Results (from the past 240 hour(s))  MRSA PCR Screening     Status: Abnormal   Collection Time: 04/16/18  8:41 AM  Result Value Ref Range Status   MRSA by PCR POSITIVE (A) NEGATIVE Final    Comment:        The GeneXpert MRSA Assay (FDA approved for NASAL specimens only), is one component of a comprehensive MRSA colonization surveillance program. It is not intended to diagnose MRSA infection nor to guide or monitor treatment for MRSA infections. RESULT CALLED TO, READ BACK BY AND VERIFIED WITH: MARTIN D. AT 1216P ON [id number] BY THOMPSON S. Performed at [facility], [address], [place], [place] [address]      Hospital Course: This is an 85 year old who came to the emergency department because of shortness of breath.  She was found to have hypoxic respiratory failure.  She had what appeared to be acute bronchitis acute on chronic diastolic heart failure and influenza A.  All of these were treated and she improved.  She had some trouble with low blood sugar so she is had extensive changes made in her insulin dose.  By the time of discharge she was much better able to walk in the halls coughing a little bit but much clearer and she had dropped about 10 L of fluid.  Discharge Exam: Blood pressure 124/70, pulse 60, temperature 97.7 F (36.5 C), temperature source Oral, resp. rate 20, height 5' (1.524 m), weight 93.9 kg, SpO2 96 %. She is awake  and alert.  Chest is clear.  Heart is regular.  Abdomen soft.  Disposition: Home with home health services  Discharge Instructions    Face-to-face encounter (required for Medicare/Medicaid patients)   Complete by:  As directed    I Alonza Bogus certify that this patient is under my care and that I, or a nurse practitioner or physician's assistant working with me, had a face-to-face encounter that meets the physician face-to-face encounter requirements with this patient on 04/23/2018. The encounter with the patient was in whole, or in part for the following medical condition(s) which is the primary reason for home health care (List medical condition): influenza/chf   The encounter with the patient was in whole, or in part, for the following medical condition, which is the primary reason for home health care:  influenza a/chf   I certify  that, based on my findings, the following services are medically necessary home health services:   Nursing Physical therapy     Reason for Medically Necessary Home Health Services:  Skilled Nursing- Change/Decline in Patient Status   My clinical findings support the need for the above services:  Unable to leave home safely without assistance and/or assistive device   Further, I certify that my clinical findings support that this patient is homebound due to:  Unable to leave home safely without assistance   Home Health   Complete by:  As directed    To provide the following care/treatments:   PT RN        Follow-up Information    [facility] Follow up.   Why:  home health PT Contact information: [address] [place]          Signed: Alonza Bogus   04/23/2018, 9:11 AM

## 2018-04-24 ENCOUNTER — Other Ambulatory Visit: Payer: Self-pay

## 2018-04-24 DIAGNOSIS — I5033 Acute on chronic diastolic (congestive) heart failure: Secondary | ICD-10-CM

## 2018-04-24 NOTE — Telephone Encounter (Signed)
Oral Chemotherapy Pharmacist Encounter   I spoke with patient's daughter, [name], for overview of: Ibrance (palbociclib) for the treatment of metastatic, hormone receptor positive, Her-2 receptor negative breast cancer in conjunction with Faslodex, planned duration until disease progression or unacceptable toxicity.   Counseled on administration, dosing, side effects, monitoring, drug-food interactions, safe handling, storage, and disposal.  Patient will take Ibrance '125mg'$  capsules, 1 capsule by mouth once daily with breakfast for 3 weeks on, 1 week off.  [name] informed that [name] will be changing to tablet formulation as of 05/29/2018, and will be dispensed in a box of 3 cards as opposed to current dispensing of a bottle with 21 capsules.  Patient knows to avoid grapefruit and grapefruit juice. This is something that she greatly enjoys, but is in agreement to discontinue consumption.  Patient is taking Faslodex injections  Ibrance start date: planned for 04/26/2018  Adverse effects include but are not limited to: fatigue, hair loss, GI upset, nausea, decreased blood counts, and increased upper respiratory infections. Severe, life-threatening, and/or fatal interstitial lung disease (ILD) and/or pneumonitis may occur with CDK 4/6 inhibitors.  [name] will obtain anti diarrheal and alert the office if patient experiences 4 or more loose stools above baseline.  Reviewed importance of keeping a medication schedule and plan for any missed doses.  [name] voiced understanding and appreciation.   All questions answered. Medication reconciliation performed and medication/allergy list updated.  Patient recently admitted 2/17-2/25 with acute on chronic respiratory failure, congestive heart failure, and Flu A. She will be seen by Dr. Jana Hakim on 04/26/18 and directed about [name] start date at that time.  They plan to pick up the Ibrance from the [facility] after  office visits on 04/26/2018. Copayment for the 1st fill of [name] is $46.66, this is affordable at this time. [name] informed that the pharmacy will contact them 5-7 days prior to needing next fill of Ibrance to coordinate continued medication acquisition.  They know to call the office with questions or concerns. [facility] will continue to follow.  Johny Drilling, PharmD, BCPS, BCOP  04/24/2018   1:51 PM [facility] Clinic [phone]

## 2018-04-25 NOTE — Progress Notes (Signed)
[place]  [phone] [phone]    ID: [name] DOB: June 09, 1933  MR#: [id number]  AYT#:[id number]  Patient Care Team: Sinda Du, MD as PCP - General (Internal Medicine) Gala Romney, Cristopher Estimable, MD as Consulting Physician (Gastroenterology) Brnadon Eoff, Virgie Dad, MD as Consulting Physician (Oncology) Jovita Kussmaul, MD as Consulting Physician (General Surgery) Gery Pray, MD as Consulting Physician (Radiation Oncology) Erline Levine, MD as Consulting Physician (Neurosurgery) Neldon Labella, RN as [place] Management Chauncey Cruel, MD OTHER MD:   CHIEF COMPLAINT: Recurrent estrogen receptor positive breast cancer  CURRENT TREATMENT: FULVESTRANT; PALBOCICLIB   HISTORY OF CURRENT ILLNESS: From the original intake note  [name] has a history of left breast invasive ductal carcinoma,  status post left lumpectomy and sentinel lymph node sampling 03/12/2012 for a pT1b pN0, stage IA invasive ductal carcinoma, grade 3, strongly estrogen and progesterone receptor positive, HER-2 nonamplified, with an MIB-1-1 of 15%.  The single sentinel lymph node was negative.  There was a positive margin.  She had reexcision on 03/22/2012 for what proved to be a single residual focus of intermediate grade ductal carcinoma in situ.  This was close to the lateral inked margin which was however negative.  She saw radiation oncology (Drs. Orlene Erm and Valere Dross) who felt adjuvant endocrine therapy alone would be adequate. She started anastrozole under Dr. Jacquiline Doe at [facility], on which she continues.  More recently she underwent routine bilateral diagnostic mammography with tomography and left breast ultrasonography at [facility] on 02/26/2018 showing: highly suspicious 1.6 cm retroareolar left breast mass, and single abnormal left axillary lymph node.  Accordingly on 03/05/2018 she proceeded to biopsy of the left breast area in  question. The pathology ([id number]) from this procedure showed: invasive ductal carcinoma, grade 3; ductal carcinoma in the lymph node tissue. Prognostic indicators significant for: estrogen receptor, 95% positive and progesterone receptor, 5% positive, both with strong staining intensity. Proliferation marker Ki67 at 20%. HER2 negative by immunohistochemistry, 1+.  The patient's subsequent history is as detailed below.   INTERVAL HISTORY: [name] returns today for follow-up and treatment of her recurrent estrogen receptor positive breast cancer. She is accompanied by her daughter.  She will receive her second fulvestrant dose today.  She tolerates this well and without any noticeable side effects.    She was recently admitted to [facility] on 04/15/2018 for congestive heart failure and bronchitis.  On 04/13/2018 she developed a throat tickle. By 04/14/2018 she developed a full blown cough. While admitted, she tested positive for the flu, but she was afebrile with no aches and no nausea. She was given Tamiflu to treat.   Since her last visit here, she underwent a chest Xray on 04/15/2018 for shortness of breath showing Hypoinflation with hazy prominence of the bilateral central bronchovascular markings which may be due to mild interstitial edema versus acute bronchitic process.   REVIEW OF SYSTEMS: In addition to being sick, [name] has had some issue swith her sugar dropping early in the morning. On 04/24/2018, she became lethargic while eating breakfast, but then she rebounded rather quickly. Her daughter says that this is a learning experience for them both. She notes that [name] accidentally took two doses of her morning medications today because she became confused about what day it was. [name] is currently staying with her daughter and son-in-law. The patient denies unusual headaches, visual changes, vomiting, or dizziness. This been no change in bowel or bladder habits. The patient denies  unexplained weight loss, bleeding, rash, or fever. A detailed review of systems was otherwise noncontributory.    PAST MEDICAL HISTORY: Past Medical History:  Diagnosis Date  . Abrasion of arm, left    secondary to fall   . Anemia   . Arthritis   . Breast cancer, left ([place])    er/pr +  . Cataract    no surgery as yet,starting of 3 years  . Chronic kidney disease    renal stones, one episode of Lithotripsy  . Cirrhosis (HCC)    Metavir score 4  . Complication of anesthesia   . Diabetes mellitus   . GERD (gastroesophageal reflux disease)   . Hyperlipidemia   . Hypertension    followed by [facility] grp. relative to  urology study grp.  Doesn't report ever having a stress test   . Mental disorder   . Multiple falls   . Nocturia   . PONV (postoperative nausea and vomiting)   . Seasonal allergies   . Skin cancer    face- melanoma, 2012- surg. excision      PAST SURGICAL HISTORY: Past Surgical History:  Procedure Laterality Date  . ABDOMINAL HYSTERECTOMY    . BACK SURGERY     x4 back surgery to include rods & screws  . Last in May 2014  . BREAST LUMPECTOMY WITH NEEDLE LOCALIZATION AND AXILLARY SENTINEL LYMPH NODE BX  03/12/2012   Procedure: BREAST LUMPECTOMY WITH NEEDLE LOCALIZATION AND AXILLARY SENTINEL LYMPH NODE BX;  Surgeon: Merrie Roof, MD;  Location: [place];  Service: General;  Laterality: Left;  . BREAST SURGERY     lumpectomy x2/left   . CHOLECYSTECTOMY    . COLONOSCOPY N/A 11/20/2013   Dr.Rourk- diverticula was found in the left colon. o/w normal rectal, colonic and terminal ileal mucosa  . ESOPHAGOGASTRODUODENOSCOPY N/A 11/20/2013   Dr.Rourk- abnormal mucosa was found. diffuse snake skinning and friability of the gastric mucosa. patent pylorus. abnormal-appearing first, second and third portion of the duodenum. 39m gastric nodule at the GE junction. stomach bx= minimal chronic infammation, GE junction bx= polypoid fragments of gastric cardia type mucosa w/mod chronic  inflammation and foveolar hyperplasia  . EYE SURGERY     cataract surgery bilat   . FRACTURE SURGERY     1975- ORIF- L ankle   . GIVENS CAPSULE STUDY N/A 02/24/2014   Procedure: GIVENS CAPSULE STUDY;  Surgeon: RDaneil Dolin MD;  [facility];  Service: Endoscopy;  Laterality: N/A;  . RE-EXCISION OF BREAST LUMPECTOMY  03/22/2012   Procedure: RE-EXCISION OF BREAST LUMPECTOMY;  Surgeon: PMerrie Roof MD;  [facility]  Service: General;  Laterality: Left;  re-excision left breast lumpectomy cavity  ER+, PR+  . ROTATOR CUFF REPAIR Bilateral   . SHOULDER SURGERY    . SKIN BIOPSY    . TOTAL KNEE ARTHROPLASTY Right 12/18/2014   Procedure: RIGHT TOTAL KNEE ARTHROPLASTY;  Surgeon: RSydnee Cabal MD;  Location: [facility];  Service: Orthopedics;  Laterality: Right;  . TUBAL LIGATION      FAMILY HISTORY: Family History  Problem Relation Age of Onset  . Stroke Mother        mini-strokes  . Heart disease Father   . Emphysema Father   . Cancer Brother        metastatic, unsure primary  . Colon cancer Neg Hx   . Prostate cancer Neg Hx    Patient father was 627years old when he died from "heart  trouble" and emphysema. Patient mother died from mini-stroke complications at age 100. She denies a family hx of breast or ovarian cancer, prostate or pancreatic cancers. She has 3 siblings. She had a twin brother who died at age 37 from metastatic cancer. She had 2 sisters. Her only living sibling is a sister.   GYNECOLOGIC HISTORY:  No LMP recorded. Patient has had a hysterectomy. Menarche: 85 years old Age at first live birth: 85 years old [place] P 2 LMP s/p hysterectomy Contraceptive no HRT yes, for more than 5 years  Hysterectomy? Yes, at age 27 BSO? 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 Her husband retired on disability, but he passed away in 2015-05-25 from  mini-strokes. She lives at home with 2 cats. Daughter [name], who is in her 32s, is married to [name] (who works for the [place]) and lives very close to the patient.  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  The patient's son died at age 41 from accidental overdose.  The patient attends [employer] in [place].   ADVANCED DIRECTIVES: Daughter [name] is her HCPOA.   HEALTH MAINTENANCE: Social History   Tobacco Use  . Smoking status: Never Smoker  . Smokeless tobacco: Never Used  Substance Use Topics  . Alcohol use: No  . Drug use: No     Colonoscopy: 11/20/2013, Dr. Gala Romney, chronic inflammation  PAP: s/p hysterectomy   Bone density: "had one probably 10 years ago"   Allergies  Allergen Reactions  . Bee Venom Anaphylaxis and Swelling  . Hydromorphone Hcl Other (See Comments)    Agitation, Confusion  . Demerol Nausea Only    Current Outpatient Medications  Medication Sig Dispense Refill  . ALPHAGAN P 0.1 % SOLN Place 1 drop into both eyes 2 (two) times daily.     [name] Kitchen amLODipine (NORVASC) 5 MG tablet Take 5 mg by mouth every morning.     [name] Kitchen aspirin EC 81 MG tablet Take 81 mg by mouth daily.    [name] Kitchen atorvastatin (LIPITOR) 10 MG tablet Take 10 mg by mouth every evening.     . Bacillus Coagulans-Inulin (PROBIOTIC FORMULA PO) Take 1 capsule by mouth daily.    . cyanocobalamin (,VITAMIN B-12,) 1000 MCG/ML injection Inject 1,000 mcg into the muscle every 30 (thirty) days.     . dorzolamide (TRUSOPT) 2 % ophthalmic solution Place 1 drop into both eyes 2 (two) times daily.   4  . EPIPEN 2-PAK 0.3 MG/0.3ML SOAJ injection Inject 0.3 mg into the muscle as needed for anaphylaxis.     . fulvestrant (FASLODEX) 250 MG/5ML injection Inject 500 mg into the muscle every 14 (fourteen) days. One injection each buttock over 1-2 minutes. Warm prior to use.    . gabapentin (NEURONTIN) 600 MG tablet Take 600 mg by mouth daily.     [name] Kitchen glipiZIDE (GLUCOTROL) 5 MG tablet Take 5 mg by  mouth daily before breakfast.     . guaiFENesin-dextromethorphan (ROBITUSSIN DM) 100-10 MG/5ML syrup Take 5 mLs by mouth every 4 (four) hours as needed for cough. 118 mL 0  . hydrALAZINE (APRESOLINE) 25 MG tablet Take 25-50 mg by mouth See admin instructions. Take 50 mg by mouth in the morning and take 25 mg by mouth at bedtime    . ipratropium-albuterol (DUONEB) 0.5-2.5 (3) MG/3ML SOLN Take 3 mLs by nebulization 3 (three) times daily. 360 mL  3  . latanoprost (XALATAN) 0.005 % ophthalmic solution Place 1 drop into both eyes at bedtime.     . metoprolol succinate (TOPROL-XL) 100 MG 24 hr tablet Take 100 mg by mouth every morning. Take with or immediately following a meal.    . mirtazapine (REMERON) 15 MG tablet Take 15 mg by mouth at bedtime.     [name] Kitchen MYRBETRIQ 50 MG TB24 tablet Take 50 mg by mouth daily.     [name] Kitchen omeprazole (PRILOSEC) 20 MG capsule Take 20 mg by mouth daily.    . Oxycodone HCl 10 MG TABS Take 10 mg by mouth daily.     . palbociclib (IBRANCE) 125 MG capsule Take 125 mg by mouth daily with breakfast. Take whole with food. Take for 21 days on, 7 days off, repeat every 28 days.    Vladimir Faster Glycol-Propyl Glycol (SYSTANE ULTRA) 0.4-0.3 % SOLN Place 1-2 drops into both eyes daily as needed (for itching eyes).    . torsemide (DEMADEX) 20 MG tablet Take 1 tablet (20 mg total) by mouth daily. 30 tablet 5  . TRESIBA FLEXTOUCH 100 UNIT/ML SOPN FlexTouch Pen Inject 0.5 mLs (50 Units total) into the skin daily. 5 pen 5   No current facility-administered medications for this visit.     OBJECTIVE: Elderly white woman who appears stated age  12:   04/26/18 1252  BP: (!) 113/45  Pulse: 84  Resp: 18  Temp: 98.3 F (36.8 C)  SpO2: 97%     Body mass index is 39.94 kg/m.   Wt Readings from Last 3 Encounters:  04/26/18 204 lb 8 oz (92.8 kg)  04/23/18 207 lb 0.2 oz (93.9 kg)  04/12/18 214 lb 12.8 oz (97.4 kg)      ECOG FS:2 - Symptomatic, <50% confined to bed  Sclerae unicteric,  pupils round and equal Oropharynx clear, slightly dry No cervical or supraclavicular adenopathy Lungs no rales or rhonchi Heart regular rate and rhythm Abd soft, obese, nontender, positive bowel sounds Neuro: nonfocal, well oriented, positive affect Breasts: The right breast is benign.  The lesions on the left breast are as imaged below.  The places from the 2 recent biopsies showed no erythema or swelling.  Both axillae are benign.  Left breast 04/03/2018     LAB RESULTS:  CMP     Component Value Date/Time   NA 132 (L) 04/26/2018 1235   K 5.2 (H) 04/26/2018 1235   CL 95 (L) 04/26/2018 1235   CO2 27 04/26/2018 1235   GLUCOSE 294 (H) 04/26/2018 1235   BUN 81 (H) 04/26/2018 1235   CREATININE 2.60 (H) 04/26/2018 1235   CALCIUM 8.0 (L) 04/26/2018 1235   PROT 6.0 (L) 04/26/2018 1235   ALBUMIN 2.9 (L) 04/26/2018 1235   AST 28 04/26/2018 1235   ALT 24 04/26/2018 1235   ALKPHOS 155 (H) 04/26/2018 1235   BILITOT 0.4 04/26/2018 1235   GFRNONAA 17 (L) 04/26/2018 1235   GFRAA 19 (L) 04/26/2018 1235    No results found for: TOTALPROTELP, ALBUMINELP, A1GS, A2GS, BETS, BETA2SER, GAMS, MSPIKE, SPEI  No results found for: KPAFRELGTCHN, LAMBDASER, KAPLAMBRATIO  Lab Results  Component Value Date   WBC 11.4 (H) 04/26/2018   NEUTROABS 8.0 (H) 04/26/2018   HGB 10.0 (L) 04/26/2018   HCT 33.7 (L) 04/26/2018   MCV 87.1 04/26/2018   PLT 204 04/26/2018    '@LASTCHEMISTRY'$ @  No results found for: LABCA2  No components found for: [id number]  No results for input(s): INR in the  last 168 hours.  No results found for: LABCA2  No results found for: NTZ001  No results found for: VCB449  No results found for: QPR916  No results found for: CA2729  No components found for: HGQUANT  No results found for: CEA1 / No results found for: CEA1   No results found for: AFPTUMOR  No results found for: CHROMOGRNA  No results found for: PSA1  Appointment on 04/26/2018  Component Date  Value Ref Range Status  . Sodium 04/26/2018 132* 135 - 145 mmol/L Final  . Potassium 04/26/2018 5.2* 3.5 - 5.1 mmol/L Final  . Chloride 04/26/2018 95* 98 - 111 mmol/L Final  . CO2 04/26/2018 27  22 - 32 mmol/L Final  . Glucose, Bld 04/26/2018 294* 70 - 99 mg/dL Final  . BUN 04/26/2018 81* 8 - 23 mg/dL Final  . Creatinine, Ser 04/26/2018 2.60* 0.44 - 1.00 mg/dL Final  . Calcium 04/26/2018 8.0* 8.9 - 10.3 mg/dL Final  . Total Protein 04/26/2018 6.0* 6.5 - 8.1 g/dL Final  . Albumin 04/26/2018 2.9* 3.5 - 5.0 g/dL Final  . AST 04/26/2018 28  15 - 41 U/L Final  . ALT 04/26/2018 24  0 - 44 U/L Final  . Alkaline Phosphatase 04/26/2018 155* 38 - 126 U/L Final  . Total Bilirubin 04/26/2018 0.4  0.3 - 1.2 mg/dL Final  . GFR calc non Af Amer 04/26/2018 17* >60 mL/min Final  . GFR calc Af Amer 04/26/2018 19* >60 mL/min Final  . Anion gap 04/26/2018 10  5 - 15 Final   Performed at [facility], [place] [address] [place], [place] [address]  . WBC 04/26/2018 11.4* 4.0 - 10.5 K/uL Final  . RBC 04/26/2018 3.87  3.87 - 5.11 MIL/uL Final  . Hemoglobin 04/26/2018 10.0* 12.0 - 15.0 g/dL Final  . HCT 04/26/2018 33.7* 36.0 - 46.0 % Final  . MCV 04/26/2018 87.1  80.0 - 100.0 fL Final  . MCH 04/26/2018 25.8* 26.0 - 34.0 pg Final  . MCHC 04/26/2018 29.7* 30.0 - 36.0 g/dL Final  . RDW 04/26/2018 15.4  11.5 - 15.5 % Final  . Platelets 04/26/2018 204  150 - 400 K/uL Final  . nRBC 04/26/2018 0.0  0.0 - 0.2 % Final  . Neutrophils Relative % 04/26/2018 71  % Final  . Neutro Abs 04/26/2018 8.0* 1.7 - 7.7 K/uL Final  . Lymphocytes Relative 04/26/2018 16  % Final  . Lymphs Abs 04/26/2018 1.9  0.7 - 4.0 K/uL Final  . Monocytes Relative 04/26/2018 9  % Final  . Monocytes Absolute 04/26/2018 1.1* 0.1 - 1.0 K/uL Final  . Eosinophils Relative 04/26/2018 3  % Final  . Eosinophils Absolute 04/26/2018 0.4  0.0 - 0.5 K/uL Final  . Basophils Relative 04/26/2018 0  % Final  . Basophils Absolute  04/26/2018 0.0  0.0 - 0.1 K/uL Final  . Immature Granulocytes 04/26/2018 1  % Final  . Abs Immature Granulocytes 04/26/2018 0.07  0.00 - 0.07 K/uL Final   Performed at [facility], [place] [address] [place], [place] [address]    (this displays the last labs from the last 3 days)  No results found for: TOTALPROTELP, ALBUMINELP, A1GS, A2GS, BETS, BETA2SER, GAMS, MSPIKE, SPEI (this displays SPEP labs)  No results found for: KPAFRELGTCHN, LAMBDASER, KAPLAMBRATIO (kappa/lambda light chains)  No results found for: HGBA, HGBA2QUANT, HGBFQUANT, HGBSQUAN (Hemoglobinopathy evaluation)   No results found for: LDH  No results found for: IRON, TIBC, IRONPCTSAT (Iron and TIBC)  No results found for: FERRITIN  Urinalysis    Component Value Date/Time   COLORURINE YELLOW 08/17/2017 2105   APPEARANCEUR CLEAR 08/17/2017 2105   LABSPEC 1.013 08/17/2017 2105   PHURINE 5.0 08/17/2017 2105   GLUCOSEU NEGATIVE 08/17/2017 2105   HGBUR NEGATIVE 08/17/2017 2105   BILIRUBINUR NEGATIVE 08/17/2017 2105   KETONESUR NEGATIVE 08/17/2017 2105   PROTEINUR NEGATIVE 08/17/2017 2105   UROBILINOGEN 1.0 12/11/2014 1106   NITRITE NEGATIVE 08/17/2017 2105   LEUKOCYTESUR TRACE (A) 08/17/2017 2105     STUDIES: Dg Chest 2 View  Result Date: 04/15/2018 CLINICAL DATA:  Shortness of breath/hypoxia. EXAM: CHEST - 2 VIEW COMPARISON:  08/17/2017 and chest CT 04/11/2018 FINDINGS: Lungs are somewhat hypoinflated demonstrate hazy prominence of the central bronchovascular markings without focal lobar consolidation or effusion. Cardiomediastinal silhouette and remainder of the exam is unchanged. IMPRESSION: Hypoinflation with hazy prominence of the bilateral central bronchovascular markings which may be due to mild interstitial edema versus acute bronchitic process. Electronically Signed   By: Marin Olp M.D.   On: 04/15/2018 09:50   Ct Chest Wo Contrast  Result Date: 04/12/2018 CLINICAL DATA:   Breast cancer. Staging. Recurrent left breast cancer with apparent spread to skin/chest wall. EXAM: CT CHEST WITHOUT CONTRAST TECHNIQUE: Multidetector CT imaging of the chest was performed following the standard protocol without IV contrast. COMPARISON:  Today's bone scan, dictated separately. Chest radiograph 08/17/2017. Abdominal CT of 04/10/2011. Clinic note of 04/03/2018 FINDINGS: Cardiovascular: Aortic atherosclerosis. Tortuous thoracic aorta. Moderate cardiomegaly, without pericardial effusion. Multivessel coronary artery atherosclerosis. Mediastinum/Nodes: Multiple small left axillary nodes. None are pathologic by size criteria. A right axillary node is upper normal sized at 10 mm on image 29/3. No mediastinal or definite hilar adenopathy, given limitations of unenhanced CT. Lungs/Pleura: No pleural fluid. Bibasilar scarring. No suspicious pulmonary nodule or mass. Upper Abdomen: Cholecystectomy. Advanced cirrhosis. Splenomegaly, with a maximal transverse dimension of 16.4 cm. Vague low-density within the medial spleen of 1.7 cm was present in 2013 and of no clinical significance. Normal imaged portions of the stomach, pancreas, adrenal glands, kidneys. Abdominal aortic atherosclerosis. Multiple small retroperitoneal nodes, none pathologic by size criteria. Musculoskeletal: Left breast skin thickening. Retroareolar soft tissue fullness including on image 66/3. Left breast nodularity including laterally at 11 mm on image 71/3 and 12 mm on image 81/3. Mild T12 compression deformity. Surgical changes about the upper lumbar spine, incompletely imaged. IMPRESSION: 1. Left breast skin thickening and nodularity, nonspecific in the setting of prior therapy. 2. Borderline right axillary adenopathy. Multiple left axillary nodes, none pathologic by size criteria. 3. Cirrhosis and splenomegaly, suboptimally evaluated. 4. Coronary artery atherosclerosis. Aortic Atherosclerosis (ICD10-I70.0). Electronically Signed   By:  Abigail Miyamoto M.D.   On: 04/12/2018 08:21   Nm Bone Scan Whole Body  Result Date: 04/12/2018 CLINICAL DATA:  Breast cancer .  Evaluate for metastatic disease. EXAM: NUCLEAR MEDICINE WHOLE BODY BONE SCAN TECHNIQUE: Whole body anterior and posterior images were obtained approximately 3 hours after intravenous injection of radiopharmaceutical. RADIOPHARMACEUTICALS:  20.6 mCi Technetium-68mMDP IV COMPARISON:  Chest CT 04/11/2018 FINDINGS: Focal intense uptake within a large portion of the the T12 vertebral body. Small focus of uptake in the RIGHT aspect T11 vertebral body. Findings respond to severe degenerate change associated with the posterior thoraco lumbar fusion partially imaged on chest CT 04/11/2018 More mild posterior uptake within the lumbar vertebral bodies is a degenerative pattern. Intense uptake in the LEFT ankle. IMPRESSION: 1. Uptake in the T12 vertebral body related to thoracolumbar fusion  and severe disc osteophytic disease seen on comparison CT. 2. Intense uptake in the LEFT ankle presumably represents trauma. Recommend radiographs of the LEFT ankle additionally. Electronically Signed   By: Suzy Bouchard M.D.   On: 04/12/2018 11:15     ELIGIBLE FOR AVAILABLE RESEARCH PROTOCOL: no   ASSESSMENT: 85 y.o. [place] woman status post left ectomy and sentinel lymph node sampling 03/12/2012 for a  pT1b pN0, stage IA invasive ductal carcinoma, grade 3, strongly estrogen and progesterone receptor negative, HER-2 nonamplified, with an MIB-1 of 15%  (a) additional surgery for margin clearance was successful 03/22/2012  (1) the patient opted against adjuvant radiation  (2) anastrozole started March 2014, discontinued 04/03/2018 with evidence of recurrence  RECURRENT DISEASE: February 2020 (3) status post left breast retroareolar biopsy 03/05/2018 for a clinical T1c N1, stage IIA invasive ductal carcinoma, estrogen and progesterone receptor positive, HER-2 not amplified, with an MIB-1 of  20%.  (4) apparent spread to skin/chest wall on left noted 04/03/2018  (a) chest CT 04/11/2018 scan showed nonspecific bilateral axillary adenopathy   (B) bone scan 04/11/2018 shows only degenerative disease  (b) CA 27-29 pending  (d) biopsy of 2 left breast skin nodules 04/04/2018 shows invasive carcinoma, estrogen receptor 100% positive, progesterone receptor and HER-2 negative, with an MIB-1 of 30%.  (5) started fulvestrant 04/12/2018  (a) to add palbociclib 04/26/2018 at 125 mg p.o. daily, 21 days on, 7 days off  PLAN: Eliyanah is tolerating her fulvestrant without any side effects that she is aware of.  She will receive her second dose today and then a third dose on 05/10/2018.  From that point she will be treated on an every 28-day basis.  We reviewed the possible toxicities side effects and complications of palbociclib today and she will also meet with our oral chemotherapy pharmacy specialist today to receive her medication and to review the side effects again.  She will start the pills today.  We will repeat lab work 2 weeks from now and then 6 weeks from now.  She seems to have survived the flu with amazingly few symptoms.  She is having some problems with low sugars but that is being addressed by Dr Luan Pulling  She knows to call for any other issues that may develop before the next visit.     Tajah Noguchi, Virgie Dad, MD  04/26/18 1:36 PM Medical Oncology and Hematology [facility] [address] [place], [place] [address] Tel. [phone]    Fax. [phone]  I, Jacqualyn Posey am acting as a Education administrator for Chauncey Cruel, MD.   I, Lurline Del MD, have reviewed the above documentation for accuracy and completeness, and I agree with the above.

## 2018-04-26 ENCOUNTER — Telehealth: Payer: Self-pay | Admitting: Oncology

## 2018-04-26 ENCOUNTER — Inpatient Hospital Stay: Payer: Medicare HMO

## 2018-04-26 ENCOUNTER — Other Ambulatory Visit: Payer: Self-pay

## 2018-04-26 ENCOUNTER — Inpatient Hospital Stay (HOSPITAL_BASED_OUTPATIENT_CLINIC_OR_DEPARTMENT_OTHER): Payer: Medicare HMO | Admitting: Oncology

## 2018-04-26 VITALS — BP 113/45 | HR 84 | Temp 98.3°F | Resp 18 | Ht 60.0 in | Wt 204.5 lb

## 2018-04-26 DIAGNOSIS — C50312 Malignant neoplasm of lower-inner quadrant of left female breast: Secondary | ICD-10-CM

## 2018-04-26 DIAGNOSIS — C50912 Malignant neoplasm of unspecified site of left female breast: Secondary | ICD-10-CM

## 2018-04-26 DIAGNOSIS — E1140 Type 2 diabetes mellitus with diabetic neuropathy, unspecified: Secondary | ICD-10-CM

## 2018-04-26 DIAGNOSIS — Z7982 Long term (current) use of aspirin: Secondary | ICD-10-CM | POA: Diagnosis not present

## 2018-04-26 DIAGNOSIS — Z5111 Encounter for antineoplastic chemotherapy: Secondary | ICD-10-CM | POA: Diagnosis not present

## 2018-04-26 DIAGNOSIS — Z7984 Long term (current) use of oral hypoglycemic drugs: Secondary | ICD-10-CM

## 2018-04-26 DIAGNOSIS — IMO0002 Reserved for concepts with insufficient information to code with codable children: Secondary | ICD-10-CM

## 2018-04-26 DIAGNOSIS — C50812 Malignant neoplasm of overlapping sites of left female breast: Secondary | ICD-10-CM

## 2018-04-26 DIAGNOSIS — E119 Type 2 diabetes mellitus without complications: Secondary | ICD-10-CM | POA: Diagnosis not present

## 2018-04-26 DIAGNOSIS — Z79899 Other long term (current) drug therapy: Secondary | ICD-10-CM | POA: Diagnosis not present

## 2018-04-26 DIAGNOSIS — I10 Essential (primary) hypertension: Secondary | ICD-10-CM

## 2018-04-26 DIAGNOSIS — Z170 Estrogen receptor positive status [ER+]: Secondary | ICD-10-CM | POA: Diagnosis not present

## 2018-04-26 DIAGNOSIS — C50012 Malignant neoplasm of nipple and areola, left female breast: Secondary | ICD-10-CM

## 2018-04-26 DIAGNOSIS — Z85820 Personal history of malignant melanoma of skin: Secondary | ICD-10-CM | POA: Diagnosis not present

## 2018-04-26 DIAGNOSIS — Z90710 Acquired absence of both cervix and uterus: Secondary | ICD-10-CM

## 2018-04-26 DIAGNOSIS — E1165 Type 2 diabetes mellitus with hyperglycemia: Secondary | ICD-10-CM

## 2018-04-26 LAB — CBC WITH DIFFERENTIAL/PLATELET
Abs Immature Granulocytes: 0.07 10*3/uL (ref 0.00–0.07)
Basophils Absolute: 0 10*3/uL (ref 0.0–0.1)
Basophils Relative: 0 %
Eosinophils Absolute: 0.4 10*3/uL (ref 0.0–0.5)
Eosinophils Relative: 3 %
HCT: 33.7 % — ABNORMAL LOW (ref 36.0–46.0)
Hemoglobin: 10 g/dL — ABNORMAL LOW (ref 12.0–15.0)
Immature Granulocytes: 1 %
Lymphocytes Relative: 16 %
Lymphs Abs: 1.9 10*3/uL (ref 0.7–4.0)
MCH: 25.8 pg — ABNORMAL LOW (ref 26.0–34.0)
MCHC: 29.7 g/dL — ABNORMAL LOW (ref 30.0–36.0)
MCV: 87.1 fL (ref 80.0–100.0)
Monocytes Absolute: 1.1 10*3/uL — ABNORMAL HIGH (ref 0.1–1.0)
Monocytes Relative: 9 %
NEUTROS PCT: 71 %
Neutro Abs: 8 10*3/uL — ABNORMAL HIGH (ref 1.7–7.7)
PLATELETS: 204 10*3/uL (ref 150–400)
RBC: 3.87 MIL/uL (ref 3.87–5.11)
RDW: 15.4 % (ref 11.5–15.5)
WBC: 11.4 10*3/uL — ABNORMAL HIGH (ref 4.0–10.5)
nRBC: 0 % (ref 0.0–0.2)

## 2018-04-26 LAB — COMPREHENSIVE METABOLIC PANEL
ALT: 24 U/L (ref 0–44)
ANION GAP: 10 (ref 5–15)
AST: 28 U/L (ref 15–41)
Albumin: 2.9 g/dL — ABNORMAL LOW (ref 3.5–5.0)
Alkaline Phosphatase: 155 U/L — ABNORMAL HIGH (ref 38–126)
BUN: 81 mg/dL — ABNORMAL HIGH (ref 8–23)
CO2: 27 mmol/L (ref 22–32)
Calcium: 8 mg/dL — ABNORMAL LOW (ref 8.9–10.3)
Chloride: 95 mmol/L — ABNORMAL LOW (ref 98–111)
Creatinine, Ser: 2.6 mg/dL — ABNORMAL HIGH (ref 0.44–1.00)
GFR calc non Af Amer: 17 mL/min — ABNORMAL LOW (ref >60)
GFR, EST AFRICAN AMERICAN: 19 mL/min — AB (ref >60)
Glucose, Bld: 294 mg/dL — ABNORMAL HIGH (ref 70–99)
Potassium: 5.2 mmol/L — ABNORMAL HIGH (ref 3.5–5.1)
Sodium: 132 mmol/L — ABNORMAL LOW (ref 135–145)
Total Bilirubin: 0.4 mg/dL (ref 0.3–1.2)
Total Protein: 6 g/dL — ABNORMAL LOW (ref 6.5–8.1)

## 2018-04-26 MED ORDER — FULVESTRANT 250 MG/5ML IM SOLN
500.0000 mg | Freq: Once | INTRAMUSCULAR | Status: AC
  Administered 2018-04-26: 500 mg via INTRAMUSCULAR

## 2018-04-26 MED FILL — IBRANCE 125 MG CAPSULE: 125 | 28 days supply | Qty: 21 | Fill #0

## 2018-04-26 NOTE — Patient Instructions (Signed)

## 2018-04-26 NOTE — Telephone Encounter (Signed)
Gave avs and calendar ° °

## 2018-04-26 NOTE — Patient Outreach (Signed)
[place] [facility]) Care Management  04/26/2018  CARLIE SOLORZANO Dec 29, 1933 [id number]   Referral received from [facility] for complex case management.  Unsuccessful outreach attempt. Left HIPAA compliant voice message requesting a return call.   PLAN Will send unsuccessful outreach letter. Will attempt outreach within 3-4 business days.  [place] [phone]

## 2018-04-27 LAB — CANCER ANTIGEN 27.29: CA 27.29: 25.6 U/mL (ref 0.0–38.6)

## 2018-04-29 DIAGNOSIS — I5033 Acute on chronic diastolic (congestive) heart failure: Secondary | ICD-10-CM | POA: Diagnosis not present

## 2018-04-29 DIAGNOSIS — E1122 Type 2 diabetes mellitus with diabetic chronic kidney disease: Secondary | ICD-10-CM | POA: Diagnosis not present

## 2018-04-29 DIAGNOSIS — N189 Chronic kidney disease, unspecified: Secondary | ICD-10-CM | POA: Diagnosis not present

## 2018-04-29 DIAGNOSIS — E785 Hyperlipidemia, unspecified: Secondary | ICD-10-CM | POA: Diagnosis not present

## 2018-04-29 DIAGNOSIS — Z85820 Personal history of malignant melanoma of skin: Secondary | ICD-10-CM | POA: Diagnosis not present

## 2018-04-29 DIAGNOSIS — M6281 Muscle weakness (generalized): Secondary | ICD-10-CM | POA: Diagnosis not present

## 2018-04-29 DIAGNOSIS — K219 Gastro-esophageal reflux disease without esophagitis: Secondary | ICD-10-CM | POA: Diagnosis not present

## 2018-04-29 DIAGNOSIS — E11649 Type 2 diabetes mellitus with hypoglycemia without coma: Secondary | ICD-10-CM | POA: Diagnosis not present

## 2018-04-29 DIAGNOSIS — C50911 Malignant neoplasm of unspecified site of right female breast: Secondary | ICD-10-CM | POA: Diagnosis not present

## 2018-04-29 DIAGNOSIS — I130 Hypertensive heart and chronic kidney disease with heart failure and stage 1 through stage 4 chronic kidney disease, or unspecified chronic kidney disease: Secondary | ICD-10-CM | POA: Diagnosis not present

## 2018-04-29 NOTE — Telephone Encounter (Signed)
Oral Oncology Patient Advocate Encounter  Confirmed with [place] that [name] was picked up on 04/26/18 with a copay of $46.66.   [place] Patient [place] Phone [phone] Fax [phone] 04/29/2018   12:06 PM

## 2018-04-30 ENCOUNTER — Other Ambulatory Visit (HOSPITAL_COMMUNITY)
Admission: AD | Admit: 2018-04-30 | Discharge: 2018-04-30 | Disposition: A | Discharge status: Home / Self Care | Payer: Medicare HMO | Source: Skilled Nursing Facility | Attending: Pulmonary Disease | Admitting: Pulmonary Disease

## 2018-04-30 DIAGNOSIS — I130 Hypertensive heart and chronic kidney disease with heart failure and stage 1 through stage 4 chronic kidney disease, or unspecified chronic kidney disease: Secondary | ICD-10-CM | POA: Diagnosis not present

## 2018-04-30 DIAGNOSIS — I5033 Acute on chronic diastolic (congestive) heart failure: Secondary | ICD-10-CM | POA: Diagnosis not present

## 2018-04-30 DIAGNOSIS — E1122 Type 2 diabetes mellitus with diabetic chronic kidney disease: Secondary | ICD-10-CM | POA: Diagnosis not present

## 2018-04-30 DIAGNOSIS — E785 Hyperlipidemia, unspecified: Secondary | ICD-10-CM | POA: Diagnosis not present

## 2018-04-30 DIAGNOSIS — M545 Low back pain: Secondary | ICD-10-CM | POA: Diagnosis not present

## 2018-04-30 DIAGNOSIS — Z85820 Personal history of malignant melanoma of skin: Secondary | ICD-10-CM | POA: Diagnosis not present

## 2018-04-30 DIAGNOSIS — I5020 Unspecified systolic (congestive) heart failure: Secondary | ICD-10-CM | POA: Insufficient documentation

## 2018-04-30 DIAGNOSIS — C50911 Malignant neoplasm of unspecified site of right female breast: Secondary | ICD-10-CM | POA: Diagnosis not present

## 2018-04-30 DIAGNOSIS — M6281 Muscle weakness (generalized): Secondary | ICD-10-CM | POA: Diagnosis not present

## 2018-04-30 DIAGNOSIS — E11649 Type 2 diabetes mellitus with hypoglycemia without coma: Secondary | ICD-10-CM | POA: Diagnosis not present

## 2018-04-30 DIAGNOSIS — N189 Chronic kidney disease, unspecified: Secondary | ICD-10-CM | POA: Diagnosis not present

## 2018-04-30 DIAGNOSIS — E1165 Type 2 diabetes mellitus with hyperglycemia: Secondary | ICD-10-CM | POA: Diagnosis not present

## 2018-04-30 DIAGNOSIS — I10 Essential (primary) hypertension: Secondary | ICD-10-CM | POA: Diagnosis not present

## 2018-04-30 DIAGNOSIS — K219 Gastro-esophageal reflux disease without esophagitis: Secondary | ICD-10-CM | POA: Diagnosis not present

## 2018-04-30 DIAGNOSIS — N183 Chronic kidney disease, stage 3 (moderate): Secondary | ICD-10-CM | POA: Diagnosis not present

## 2018-04-30 LAB — BASIC METABOLIC PANEL
Anion gap: 11 (ref 5–15)
BUN: 50 mg/dL — ABNORMAL HIGH (ref 8–23)
CO2: 28 mmol/L (ref 22–32)
Calcium: 8.6 mg/dL — ABNORMAL LOW (ref 8.9–10.3)
Chloride: 97 mmol/L — ABNORMAL LOW (ref 98–111)
Creatinine, Ser: 2.19 mg/dL — ABNORMAL HIGH (ref 0.44–1.00)
GFR calc Af Amer: 24 mL/min — ABNORMAL LOW (ref >60)
GFR calc non Af Amer: 20 mL/min — ABNORMAL LOW (ref >60)
Glucose, Bld: 215 mg/dL — ABNORMAL HIGH (ref 70–99)
Potassium: 4.4 mmol/L (ref 3.5–5.1)
Sodium: 136 mmol/L (ref 135–145)

## 2018-05-01 ENCOUNTER — Other Ambulatory Visit: Payer: Self-pay

## 2018-05-01 NOTE — Patient Outreach (Signed)
[facility]) Care Management  05/01/2018  KATLEEN CARRAWAY [birth year] [id number]   Unsuccessful outreach attempt. Left HIPAA compliant voice message requesting a return call.  PLAN Will follow up in 3-4 business days.   [place] [phone]

## 2018-05-02 DIAGNOSIS — E785 Hyperlipidemia, unspecified: Secondary | ICD-10-CM | POA: Diagnosis not present

## 2018-05-02 DIAGNOSIS — E1122 Type 2 diabetes mellitus with diabetic chronic kidney disease: Secondary | ICD-10-CM | POA: Diagnosis not present

## 2018-05-02 DIAGNOSIS — M6281 Muscle weakness (generalized): Secondary | ICD-10-CM | POA: Diagnosis not present

## 2018-05-02 DIAGNOSIS — N189 Chronic kidney disease, unspecified: Secondary | ICD-10-CM | POA: Diagnosis not present

## 2018-05-02 DIAGNOSIS — I130 Hypertensive heart and chronic kidney disease with heart failure and stage 1 through stage 4 chronic kidney disease, or unspecified chronic kidney disease: Secondary | ICD-10-CM | POA: Diagnosis not present

## 2018-05-02 DIAGNOSIS — C50911 Malignant neoplasm of unspecified site of right female breast: Secondary | ICD-10-CM | POA: Diagnosis not present

## 2018-05-02 DIAGNOSIS — Z85820 Personal history of malignant melanoma of skin: Secondary | ICD-10-CM | POA: Diagnosis not present

## 2018-05-02 DIAGNOSIS — E11649 Type 2 diabetes mellitus with hypoglycemia without coma: Secondary | ICD-10-CM | POA: Diagnosis not present

## 2018-05-02 DIAGNOSIS — K219 Gastro-esophageal reflux disease without esophagitis: Secondary | ICD-10-CM | POA: Diagnosis not present

## 2018-05-02 DIAGNOSIS — I5033 Acute on chronic diastolic (congestive) heart failure: Secondary | ICD-10-CM | POA: Diagnosis not present

## 2018-05-04 DIAGNOSIS — R69 Illness, unspecified: Secondary | ICD-10-CM | POA: Diagnosis not present

## 2018-05-06 ENCOUNTER — Other Ambulatory Visit: Payer: Self-pay

## 2018-05-06 DIAGNOSIS — N189 Chronic kidney disease, unspecified: Secondary | ICD-10-CM | POA: Diagnosis not present

## 2018-05-06 DIAGNOSIS — E1122 Type 2 diabetes mellitus with diabetic chronic kidney disease: Secondary | ICD-10-CM | POA: Diagnosis not present

## 2018-05-06 DIAGNOSIS — M6281 Muscle weakness (generalized): Secondary | ICD-10-CM | POA: Diagnosis not present

## 2018-05-06 DIAGNOSIS — E11649 Type 2 diabetes mellitus with hypoglycemia without coma: Secondary | ICD-10-CM | POA: Diagnosis not present

## 2018-05-06 DIAGNOSIS — I5033 Acute on chronic diastolic (congestive) heart failure: Secondary | ICD-10-CM | POA: Diagnosis not present

## 2018-05-06 DIAGNOSIS — K219 Gastro-esophageal reflux disease without esophagitis: Secondary | ICD-10-CM | POA: Diagnosis not present

## 2018-05-06 DIAGNOSIS — I130 Hypertensive heart and chronic kidney disease with heart failure and stage 1 through stage 4 chronic kidney disease, or unspecified chronic kidney disease: Secondary | ICD-10-CM | POA: Diagnosis not present

## 2018-05-06 DIAGNOSIS — Z85820 Personal history of malignant melanoma of skin: Secondary | ICD-10-CM | POA: Diagnosis not present

## 2018-05-06 DIAGNOSIS — C50911 Malignant neoplasm of unspecified site of right female breast: Secondary | ICD-10-CM | POA: Diagnosis not present

## 2018-05-06 DIAGNOSIS — E785 Hyperlipidemia, unspecified: Secondary | ICD-10-CM | POA: Diagnosis not present

## 2018-05-06 NOTE — Patient Outreach (Signed)
[facility]) Care Management  05/06/2018  [name] [birth year] [id number]   3rd unsuccessful outreach attempt. Left HIPAA compliant voice message requesting a return call.   PLAN Pending case closure.   [place] [phone]

## 2018-05-09 ENCOUNTER — Other Ambulatory Visit: Payer: Self-pay

## 2018-05-09 NOTE — Patient Outreach (Signed)
[facility]) Care Management  05/09/2018  [name] 31-Mar-1933 [id number]    Case closure complete. No returned calls or response to unsuccessful outreach letter.   PLAN Will route closure letter to PCP. Will route closure letter to member.    [place] [phone]

## 2018-05-10 ENCOUNTER — Inpatient Hospital Stay: Payer: Medicare HMO | Attending: Oncology

## 2018-05-10 ENCOUNTER — Other Ambulatory Visit: Payer: Self-pay

## 2018-05-10 VITALS — BP 130/61 | HR 100 | Resp 19

## 2018-05-10 DIAGNOSIS — C50812 Malignant neoplasm of overlapping sites of left female breast: Secondary | ICD-10-CM

## 2018-05-10 DIAGNOSIS — C50012 Malignant neoplasm of nipple and areola, left female breast: Secondary | ICD-10-CM | POA: Insufficient documentation

## 2018-05-10 DIAGNOSIS — Z170 Estrogen receptor positive status [ER+]: Secondary | ICD-10-CM | POA: Diagnosis not present

## 2018-05-10 DIAGNOSIS — Z79818 Long term (current) use of other agents affecting estrogen receptors and estrogen levels: Secondary | ICD-10-CM | POA: Insufficient documentation

## 2018-05-10 DIAGNOSIS — C50912 Malignant neoplasm of unspecified site of left female breast: Secondary | ICD-10-CM

## 2018-05-10 MED ORDER — FULVESTRANT 250 MG/5ML IM SOLN
500.0000 mg | Freq: Once | INTRAMUSCULAR | Status: AC
  Administered 2018-05-10: 500 mg via INTRAMUSCULAR

## 2018-05-10 MED ORDER — FULVESTRANT 250 MG/5ML IM SOLN
INTRAMUSCULAR | Status: AC
  Filled 2018-05-10: qty 5

## 2018-05-10 NOTE — Patient Instructions (Signed)

## 2018-05-13 DIAGNOSIS — N189 Chronic kidney disease, unspecified: Secondary | ICD-10-CM | POA: Diagnosis not present

## 2018-05-13 DIAGNOSIS — E785 Hyperlipidemia, unspecified: Secondary | ICD-10-CM | POA: Diagnosis not present

## 2018-05-13 DIAGNOSIS — C50911 Malignant neoplasm of unspecified site of right female breast: Secondary | ICD-10-CM | POA: Diagnosis not present

## 2018-05-13 DIAGNOSIS — E1139 Type 2 diabetes mellitus with other diabetic ophthalmic complication: Secondary | ICD-10-CM | POA: Diagnosis not present

## 2018-05-13 DIAGNOSIS — Z961 Presence of intraocular lens: Secondary | ICD-10-CM | POA: Diagnosis not present

## 2018-05-13 DIAGNOSIS — I5033 Acute on chronic diastolic (congestive) heart failure: Secondary | ICD-10-CM | POA: Diagnosis not present

## 2018-05-13 DIAGNOSIS — I130 Hypertensive heart and chronic kidney disease with heart failure and stage 1 through stage 4 chronic kidney disease, or unspecified chronic kidney disease: Secondary | ICD-10-CM | POA: Diagnosis not present

## 2018-05-13 DIAGNOSIS — K219 Gastro-esophageal reflux disease without esophagitis: Secondary | ICD-10-CM | POA: Diagnosis not present

## 2018-05-13 DIAGNOSIS — M6281 Muscle weakness (generalized): Secondary | ICD-10-CM | POA: Diagnosis not present

## 2018-05-13 DIAGNOSIS — Z85820 Personal history of malignant melanoma of skin: Secondary | ICD-10-CM | POA: Diagnosis not present

## 2018-05-13 DIAGNOSIS — H401132 Primary open-angle glaucoma, bilateral, moderate stage: Secondary | ICD-10-CM | POA: Diagnosis not present

## 2018-05-13 DIAGNOSIS — E1122 Type 2 diabetes mellitus with diabetic chronic kidney disease: Secondary | ICD-10-CM | POA: Diagnosis not present

## 2018-05-13 DIAGNOSIS — E11649 Type 2 diabetes mellitus with hypoglycemia without coma: Secondary | ICD-10-CM | POA: Diagnosis not present

## 2018-05-15 ENCOUNTER — Telehealth: Payer: Self-pay | Admitting: *Deleted

## 2018-05-15 DIAGNOSIS — C50911 Malignant neoplasm of unspecified site of right female breast: Secondary | ICD-10-CM | POA: Diagnosis not present

## 2018-05-15 DIAGNOSIS — E11649 Type 2 diabetes mellitus with hypoglycemia without coma: Secondary | ICD-10-CM | POA: Diagnosis not present

## 2018-05-15 DIAGNOSIS — Z85820 Personal history of malignant melanoma of skin: Secondary | ICD-10-CM | POA: Diagnosis not present

## 2018-05-15 DIAGNOSIS — I130 Hypertensive heart and chronic kidney disease with heart failure and stage 1 through stage 4 chronic kidney disease, or unspecified chronic kidney disease: Secondary | ICD-10-CM | POA: Diagnosis not present

## 2018-05-15 DIAGNOSIS — N189 Chronic kidney disease, unspecified: Secondary | ICD-10-CM | POA: Diagnosis not present

## 2018-05-15 DIAGNOSIS — I5033 Acute on chronic diastolic (congestive) heart failure: Secondary | ICD-10-CM | POA: Diagnosis not present

## 2018-05-15 DIAGNOSIS — M6281 Muscle weakness (generalized): Secondary | ICD-10-CM | POA: Diagnosis not present

## 2018-05-15 DIAGNOSIS — E785 Hyperlipidemia, unspecified: Secondary | ICD-10-CM | POA: Diagnosis not present

## 2018-05-15 DIAGNOSIS — E1122 Type 2 diabetes mellitus with diabetic chronic kidney disease: Secondary | ICD-10-CM | POA: Diagnosis not present

## 2018-05-15 DIAGNOSIS — K219 Gastro-esophageal reflux disease without esophagitis: Secondary | ICD-10-CM | POA: Diagnosis not present

## 2018-05-15 NOTE — Telephone Encounter (Signed)
This RN received a VM from the pt's daughter - [name] ( the pt is staying with her at present) - and stated onset of "both her hands up to her wrist are itching "  " she took some benadryl last night and that helped but we wanted to know if this was a side effect of the medications she is taking ?"  Per chart review- pt is on faslodex and Ibrance - and discussion with MD- above is likely relating to increased hand washing and sensitivity to soaps/  This RN called [name] at given number [phone].

## 2018-05-20 DIAGNOSIS — I10 Essential (primary) hypertension: Secondary | ICD-10-CM | POA: Diagnosis not present

## 2018-05-20 DIAGNOSIS — J449 Chronic obstructive pulmonary disease, unspecified: Secondary | ICD-10-CM | POA: Diagnosis not present

## 2018-05-20 DIAGNOSIS — I509 Heart failure, unspecified: Secondary | ICD-10-CM | POA: Diagnosis not present

## 2018-05-20 MED FILL — IBRANCE 125 MG CAPSULE: 125 | 28 days supply | Qty: 21 | Fill #1

## 2018-05-27 ENCOUNTER — Ambulatory Visit: Payer: Medicare HMO | Admitting: Podiatry

## 2018-05-30 MED ORDER — TAMOXIFEN CITRATE 20 MG PO TABS: 20.0000 mg | ORAL_TABLET | Freq: Every day | ORAL | 12 refills | Status: AC

## 2018-05-30 NOTE — Progress Notes (Signed)
I called Lenisha and spoke to her daughter [name].  Nikisha is at high risk of dying if she develops the novel coronavirus so it really is much better for her to stay home.  We are currently holding off on the Faslodex for the next 2 months.  Instead she will be on tamoxifen and I have called a prescription in for her.  She will continue the palbociclib as before.  Hopefully we can resume her Faslodex on June 16 and we will give that a try

## 2018-06-03 ENCOUNTER — Telehealth: Payer: Self-pay | Admitting: Oncology

## 2018-06-03 NOTE — Telephone Encounter (Signed)
Per 4/2 schedule message moved 4/10 appointments to June. Left message for patient also spoke with dtr.

## 2018-06-07 ENCOUNTER — Inpatient Hospital Stay: Payer: Medicare HMO

## 2018-06-07 ENCOUNTER — Inpatient Hospital Stay: Payer: Medicare HMO | Admitting: Oncology

## 2018-06-10 DIAGNOSIS — R69 Illness, unspecified: Secondary | ICD-10-CM | POA: Diagnosis not present

## 2018-06-14 ENCOUNTER — Telehealth: Payer: Self-pay

## 2018-06-14 MED FILL — IBRANCE 125 MG CAPSULE: 125 | 28 days supply | Qty: 21 | Fill #2

## 2018-06-14 NOTE — Telephone Encounter (Signed)
Oral Oncology Patient Advocate Encounter  Was successful in securing patient an $16,000 grant from [employer] (PAF) to provide copayment coverage for her Ibrance.  This will keep the out of pocket expense at $0.    I spoke to the patients daughter, [name] and gave her this information.    The billing information is as follows and has been shared with [place].   Member ID: [id number] Group ID: [id number] RxBin: [id number] Dates of Eligibility: 06/04/18 through 06/04/19  [place] [facility] [place] Phone [phone] Fax [phone] 06/14/2018    9:32 AM

## 2018-06-18 DIAGNOSIS — R69 Illness, unspecified: Secondary | ICD-10-CM | POA: Diagnosis not present

## 2018-06-28 ENCOUNTER — Other Ambulatory Visit: Payer: Self-pay | Admitting: Oncology

## 2018-06-28 ENCOUNTER — Telehealth: Payer: Self-pay | Admitting: *Deleted

## 2018-06-28 DIAGNOSIS — E1165 Type 2 diabetes mellitus with hyperglycemia: Secondary | ICD-10-CM | POA: Diagnosis not present

## 2018-06-28 DIAGNOSIS — R4182 Altered mental status, unspecified: Secondary | ICD-10-CM | POA: Diagnosis not present

## 2018-06-28 DIAGNOSIS — I10 Essential (primary) hypertension: Secondary | ICD-10-CM | POA: Diagnosis not present

## 2018-06-28 DIAGNOSIS — M545 Low back pain: Secondary | ICD-10-CM | POA: Diagnosis not present

## 2018-06-28 NOTE — Telephone Encounter (Signed)
Dr Jana Hakim called daughter to discuss

## 2018-06-28 NOTE — Progress Notes (Signed)
Please see nurses note today regarding patient's symptoms.  I think very likely what Azariah is experiencing is due to medication.  The daughter had her blood sugar checked last night when the patient felt weak and it was 130.  This morning it was 270.  So we are not dealing with low blood sugar problems.  Last night her blood pressure was 117/78..  It is possible that she is taking too much blood pressure at this point.  Other medications that could be confusing her include gabapentin.  She may be getting shaky from the oxycodone.  Possibly her nebulizer treatments may be contributing.  We are stopping her oxycodone, hydralazine, and Myrbetriq and changing of the gabapentin to bedtime.  They will call on Monday and let us know if that improved anything.  If not we will consider stopping the Robitussin-DM and the nebulizers as well.  If that does not solve the problem she will have to come in for lab work

## 2018-06-28 NOTE — Telephone Encounter (Signed)
Daughter left a message stating she has noticed changes with her mother. She has noticed confusion episodes, legs jerking and  Weakness. States patients knees gave way and she fell a few days ago. Last night her hands were jerking so bad, she could not hold spoon to feed herself.   Last night she called EMS to check her because she was so confused. Was better when EMS arrived.  Had another "spell where she was jerking all over" this morning when she got up at 0700. She went back to bed and took a nap, after nap she was better. Able to eat breakfast by herself. Daughter is wondering if she needs to have albs checked.

## 2018-07-01 DIAGNOSIS — R4182 Altered mental status, unspecified: Secondary | ICD-10-CM | POA: Diagnosis not present

## 2018-07-01 DIAGNOSIS — I10 Essential (primary) hypertension: Secondary | ICD-10-CM | POA: Diagnosis not present

## 2018-07-01 DIAGNOSIS — R69 Illness, unspecified: Secondary | ICD-10-CM | POA: Diagnosis not present

## 2018-07-01 DIAGNOSIS — C50919 Malignant neoplasm of unspecified site of unspecified female breast: Secondary | ICD-10-CM | POA: Diagnosis not present

## 2018-07-01 DIAGNOSIS — E1165 Type 2 diabetes mellitus with hyperglycemia: Secondary | ICD-10-CM | POA: Diagnosis not present

## 2018-07-02 LAB — ECHOCARDIOGRAM COMPLETE
Height: 60 in
Weight: 3200 oz

## 2018-07-04 ENCOUNTER — Other Ambulatory Visit (HOSPITAL_COMMUNITY)
Admission: RE | Admit: 2018-07-04 | Discharge: 2018-07-04 | Disposition: A | Discharge status: Home / Self Care | Payer: Medicare HMO | Source: Skilled Nursing Facility | Attending: Pulmonary Disease | Admitting: Pulmonary Disease

## 2018-07-04 ENCOUNTER — Ambulatory Visit: Payer: Medicare HMO | Admitting: Podiatry

## 2018-07-04 DIAGNOSIS — M5440 Lumbago with sciatica, unspecified side: Secondary | ICD-10-CM | POA: Diagnosis not present

## 2018-07-04 DIAGNOSIS — N183 Chronic kidney disease, stage 3 (moderate): Secondary | ICD-10-CM | POA: Diagnosis not present

## 2018-07-04 DIAGNOSIS — R6889 Other general symptoms and signs: Secondary | ICD-10-CM | POA: Insufficient documentation

## 2018-07-04 DIAGNOSIS — I129 Hypertensive chronic kidney disease with stage 1 through stage 4 chronic kidney disease, or unspecified chronic kidney disease: Secondary | ICD-10-CM | POA: Diagnosis not present

## 2018-07-04 DIAGNOSIS — M129 Arthropathy, unspecified: Secondary | ICD-10-CM | POA: Diagnosis not present

## 2018-07-04 DIAGNOSIS — E1142 Type 2 diabetes mellitus with diabetic polyneuropathy: Secondary | ICD-10-CM | POA: Diagnosis not present

## 2018-07-04 DIAGNOSIS — C799 Secondary malignant neoplasm of unspecified site: Secondary | ICD-10-CM | POA: Insufficient documentation

## 2018-07-04 DIAGNOSIS — E1122 Type 2 diabetes mellitus with diabetic chronic kidney disease: Secondary | ICD-10-CM | POA: Diagnosis not present

## 2018-07-04 DIAGNOSIS — Z794 Long term (current) use of insulin: Secondary | ICD-10-CM | POA: Diagnosis not present

## 2018-07-04 LAB — COMPREHENSIVE METABOLIC PANEL
ALT: 12 U/L (ref 0–44)
AST: 24 U/L (ref 15–41)
Albumin: 3.4 g/dL — ABNORMAL LOW (ref 3.5–5.0)
Alkaline Phosphatase: 82 U/L (ref 38–126)
Anion gap: 12 (ref 5–15)
BUN: 29 mg/dL — ABNORMAL HIGH (ref 8–23)
CO2: 26 mmol/L (ref 22–32)
Calcium: 8.9 mg/dL (ref 8.9–10.3)
Chloride: 99 mmol/L (ref 98–111)
Creatinine, Ser: 2.1 mg/dL — ABNORMAL HIGH (ref 0.44–1.00)
GFR calc Af Amer: 25 mL/min — ABNORMAL LOW (ref >60)
GFR calc non Af Amer: 21 mL/min — ABNORMAL LOW (ref >60)
Glucose, Bld: 293 mg/dL — ABNORMAL HIGH (ref 70–99)
Potassium: 4.5 mmol/L (ref 3.5–5.1)
Sodium: 137 mmol/L (ref 135–145)
Total Bilirubin: 0.6 mg/dL (ref 0.3–1.2)
Total Protein: 6.8 g/dL (ref 6.5–8.1)

## 2018-07-04 LAB — CBC WITH DIFFERENTIAL/PLATELET
Abs Immature Granulocytes: 0.01 10*3/uL (ref 0.00–0.07)
Basophils Absolute: 0.1 10*3/uL (ref 0.0–0.1)
Basophils Relative: 2 %
Eosinophils Absolute: 0.1 10*3/uL (ref 0.0–0.5)
Eosinophils Relative: 3 %
HCT: 34.6 % — ABNORMAL LOW (ref 36.0–46.0)
Hemoglobin: 10.8 g/dL — ABNORMAL LOW (ref 12.0–15.0)
Immature Granulocytes: 1 %
Lymphocytes Relative: 26 %
Lymphs Abs: 0.5 10*3/uL — ABNORMAL LOW (ref 0.7–4.0)
MCH: 31.5 pg (ref 26.0–34.0)
MCHC: 31.2 g/dL (ref 30.0–36.0)
MCV: 100.9 fL — ABNORMAL HIGH (ref 80.0–100.0)
Monocytes Absolute: 0.2 10*3/uL (ref 0.1–1.0)
Monocytes Relative: 8 %
Neutro Abs: 1.3 10*3/uL — ABNORMAL LOW (ref 1.7–7.7)
Neutrophils Relative %: 60 %
Platelets: 104 10*3/uL — ABNORMAL LOW (ref 150–400)
RBC: 3.43 MIL/uL — ABNORMAL LOW (ref 3.87–5.11)
RDW: 22.1 % — ABNORMAL HIGH (ref 11.5–15.5)
WBC: 2.1 10*3/uL — ABNORMAL LOW (ref 4.0–10.5)
nRBC: 0 % (ref 0.0–0.2)

## 2018-07-04 LAB — URINALYSIS, ROUTINE W REFLEX MICROSCOPIC
Bilirubin Urine: NEGATIVE
Glucose, UA: NEGATIVE mg/dL
Hgb urine dipstick: NEGATIVE
Ketones, ur: NEGATIVE mg/dL
Nitrite: NEGATIVE
Protein, ur: NEGATIVE mg/dL
Specific Gravity, Urine: 1.009 (ref 1.005–1.030)
pH: 7 (ref 5.0–8.0)

## 2018-07-05 DIAGNOSIS — N189 Chronic kidney disease, unspecified: Secondary | ICD-10-CM | POA: Diagnosis not present

## 2018-07-05 DIAGNOSIS — I10 Essential (primary) hypertension: Secondary | ICD-10-CM | POA: Diagnosis not present

## 2018-07-05 DIAGNOSIS — R4182 Altered mental status, unspecified: Secondary | ICD-10-CM | POA: Diagnosis not present

## 2018-07-05 DIAGNOSIS — E1165 Type 2 diabetes mellitus with hyperglycemia: Secondary | ICD-10-CM | POA: Diagnosis not present

## 2018-07-06 LAB — URINE CULTURE

## 2018-07-08 DIAGNOSIS — I129 Hypertensive chronic kidney disease with stage 1 through stage 4 chronic kidney disease, or unspecified chronic kidney disease: Secondary | ICD-10-CM | POA: Diagnosis not present

## 2018-07-08 DIAGNOSIS — N183 Chronic kidney disease, stage 3 (moderate): Secondary | ICD-10-CM | POA: Diagnosis not present

## 2018-07-08 DIAGNOSIS — M129 Arthropathy, unspecified: Secondary | ICD-10-CM | POA: Diagnosis not present

## 2018-07-08 DIAGNOSIS — M5440 Lumbago with sciatica, unspecified side: Secondary | ICD-10-CM | POA: Diagnosis not present

## 2018-07-08 DIAGNOSIS — Z794 Long term (current) use of insulin: Secondary | ICD-10-CM | POA: Diagnosis not present

## 2018-07-08 DIAGNOSIS — E1142 Type 2 diabetes mellitus with diabetic polyneuropathy: Secondary | ICD-10-CM | POA: Diagnosis not present

## 2018-07-08 DIAGNOSIS — E1122 Type 2 diabetes mellitus with diabetic chronic kidney disease: Secondary | ICD-10-CM | POA: Diagnosis not present

## 2018-07-11 DIAGNOSIS — E1142 Type 2 diabetes mellitus with diabetic polyneuropathy: Secondary | ICD-10-CM | POA: Diagnosis not present

## 2018-07-11 DIAGNOSIS — M129 Arthropathy, unspecified: Secondary | ICD-10-CM | POA: Diagnosis not present

## 2018-07-11 DIAGNOSIS — E1122 Type 2 diabetes mellitus with diabetic chronic kidney disease: Secondary | ICD-10-CM | POA: Diagnosis not present

## 2018-07-11 DIAGNOSIS — N183 Chronic kidney disease, stage 3 (moderate): Secondary | ICD-10-CM | POA: Diagnosis not present

## 2018-07-11 DIAGNOSIS — I129 Hypertensive chronic kidney disease with stage 1 through stage 4 chronic kidney disease, or unspecified chronic kidney disease: Secondary | ICD-10-CM | POA: Diagnosis not present

## 2018-07-11 DIAGNOSIS — Z794 Long term (current) use of insulin: Secondary | ICD-10-CM | POA: Diagnosis not present

## 2018-07-11 DIAGNOSIS — M5440 Lumbago with sciatica, unspecified side: Secondary | ICD-10-CM | POA: Diagnosis not present

## 2018-07-16 DIAGNOSIS — Z794 Long term (current) use of insulin: Secondary | ICD-10-CM | POA: Diagnosis not present

## 2018-07-16 DIAGNOSIS — I129 Hypertensive chronic kidney disease with stage 1 through stage 4 chronic kidney disease, or unspecified chronic kidney disease: Secondary | ICD-10-CM | POA: Diagnosis not present

## 2018-07-16 DIAGNOSIS — N183 Chronic kidney disease, stage 3 (moderate): Secondary | ICD-10-CM | POA: Diagnosis not present

## 2018-07-16 DIAGNOSIS — M5440 Lumbago with sciatica, unspecified side: Secondary | ICD-10-CM | POA: Diagnosis not present

## 2018-07-16 DIAGNOSIS — M129 Arthropathy, unspecified: Secondary | ICD-10-CM | POA: Diagnosis not present

## 2018-07-16 DIAGNOSIS — E1122 Type 2 diabetes mellitus with diabetic chronic kidney disease: Secondary | ICD-10-CM | POA: Diagnosis not present

## 2018-07-16 DIAGNOSIS — E1142 Type 2 diabetes mellitus with diabetic polyneuropathy: Secondary | ICD-10-CM | POA: Diagnosis not present

## 2018-07-17 MED FILL — IBRANCE 125 MG CAPSULE: 125 | 28 days supply | Qty: 21 | Fill #3

## 2018-07-18 DIAGNOSIS — I129 Hypertensive chronic kidney disease with stage 1 through stage 4 chronic kidney disease, or unspecified chronic kidney disease: Secondary | ICD-10-CM | POA: Diagnosis not present

## 2018-07-18 DIAGNOSIS — N183 Chronic kidney disease, stage 3 (moderate): Secondary | ICD-10-CM | POA: Diagnosis not present

## 2018-07-18 DIAGNOSIS — E1122 Type 2 diabetes mellitus with diabetic chronic kidney disease: Secondary | ICD-10-CM | POA: Diagnosis not present

## 2018-07-18 DIAGNOSIS — Z794 Long term (current) use of insulin: Secondary | ICD-10-CM | POA: Diagnosis not present

## 2018-07-18 DIAGNOSIS — M129 Arthropathy, unspecified: Secondary | ICD-10-CM | POA: Diagnosis not present

## 2018-07-18 DIAGNOSIS — M5440 Lumbago with sciatica, unspecified side: Secondary | ICD-10-CM | POA: Diagnosis not present

## 2018-07-18 DIAGNOSIS — E1142 Type 2 diabetes mellitus with diabetic polyneuropathy: Secondary | ICD-10-CM | POA: Diagnosis not present

## 2018-07-23 DIAGNOSIS — M5440 Lumbago with sciatica, unspecified side: Secondary | ICD-10-CM | POA: Diagnosis not present

## 2018-07-23 DIAGNOSIS — I129 Hypertensive chronic kidney disease with stage 1 through stage 4 chronic kidney disease, or unspecified chronic kidney disease: Secondary | ICD-10-CM | POA: Diagnosis not present

## 2018-07-23 DIAGNOSIS — E1142 Type 2 diabetes mellitus with diabetic polyneuropathy: Secondary | ICD-10-CM | POA: Diagnosis not present

## 2018-07-23 DIAGNOSIS — M129 Arthropathy, unspecified: Secondary | ICD-10-CM | POA: Diagnosis not present

## 2018-07-23 DIAGNOSIS — N183 Chronic kidney disease, stage 3 (moderate): Secondary | ICD-10-CM | POA: Diagnosis not present

## 2018-07-23 DIAGNOSIS — Z794 Long term (current) use of insulin: Secondary | ICD-10-CM | POA: Diagnosis not present

## 2018-07-23 DIAGNOSIS — E1122 Type 2 diabetes mellitus with diabetic chronic kidney disease: Secondary | ICD-10-CM | POA: Diagnosis not present

## 2018-07-25 DIAGNOSIS — I129 Hypertensive chronic kidney disease with stage 1 through stage 4 chronic kidney disease, or unspecified chronic kidney disease: Secondary | ICD-10-CM | POA: Diagnosis not present

## 2018-07-25 DIAGNOSIS — E1122 Type 2 diabetes mellitus with diabetic chronic kidney disease: Secondary | ICD-10-CM | POA: Diagnosis not present

## 2018-07-25 DIAGNOSIS — Z794 Long term (current) use of insulin: Secondary | ICD-10-CM | POA: Diagnosis not present

## 2018-07-25 DIAGNOSIS — N183 Chronic kidney disease, stage 3 (moderate): Secondary | ICD-10-CM | POA: Diagnosis not present

## 2018-07-25 DIAGNOSIS — M5440 Lumbago with sciatica, unspecified side: Secondary | ICD-10-CM | POA: Diagnosis not present

## 2018-07-25 DIAGNOSIS — M129 Arthropathy, unspecified: Secondary | ICD-10-CM | POA: Diagnosis not present

## 2018-07-25 DIAGNOSIS — E1142 Type 2 diabetes mellitus with diabetic polyneuropathy: Secondary | ICD-10-CM | POA: Diagnosis not present

## 2018-08-08 DIAGNOSIS — R4182 Altered mental status, unspecified: Secondary | ICD-10-CM | POA: Diagnosis not present

## 2018-08-08 DIAGNOSIS — M545 Low back pain: Secondary | ICD-10-CM | POA: Diagnosis not present

## 2018-08-08 DIAGNOSIS — E1165 Type 2 diabetes mellitus with hyperglycemia: Secondary | ICD-10-CM | POA: Diagnosis not present

## 2018-08-08 DIAGNOSIS — I10 Essential (primary) hypertension: Secondary | ICD-10-CM | POA: Diagnosis not present

## 2018-08-12 NOTE — Progress Notes (Signed)
[name]  [phone] [phone]    ID: [name] DOB: 11/08/1933  MR#: [id number]  XTG#:[id number]  Patient Care Team: Sinda Du, MD as PCP - General (Internal Medicine) Gala Romney, Cristopher Estimable, MD as Consulting Physician (Gastroenterology) , Virgie Dad, MD as Consulting Physician (Oncology) Jovita Kussmaul, MD as Consulting Physician (General Surgery) Gery Pray, MD as Consulting Physician (Radiation Oncology) Erline Levine, MD as Consulting Physician (Neurosurgery) Chauncey Cruel, MD OTHER MD:   CHIEF COMPLAINT: Recurrent estrogen receptor positive breast cancer  CURRENT TREATMENT: FULVESTRANT; PALBOCICLIB; TAMOXIFEN   INTERVAL HISTORY: [name] returns today for follow-up and treatment of her recurrent estrogen receptor positive breast cancer. Her daughter [name] participated in today's visit via telephone.  She continues on palbociclib with good tolerance. She is in her week off; day 1 will be on Saturday. She reports mild weakness and fatigue.  Denies any nausea issues.  She will receive her fourth fulvestrant dose today. This has been delayed due to coronavirus concerns. She notes she had some soreness to the injection site but otherwise no concerns.   She was placed on tamoxifen during this time. She reports some hair thinning.  She has had no hot flashes from this.   REVIEW OF SYSTEMS: Anah reports she has been living with her daughter since February 2020. She tries to walk for exercise, either in the house or on the deck depending on the weather. A detailed review of systems was otherwise entirely negative.   HISTORY OF CURRENT ILLNESS: From the original intake note  [name] has a history of left breast invasive ductal carcinoma,  status post left lumpectomy and sentinel lymph node sampling 03/12/2012 for a pT1b pN0, stage IA invasive ductal carcinoma, grade 3, strongly estrogen and progesterone  receptor positive, HER-2 nonamplified, with an MIB-1-1 of 15%.  The single sentinel lymph node was negative.  There was a positive margin.  She had reexcision on 03/22/2012 for what proved to be a single residual focus of intermediate grade ductal carcinoma in situ.  This was close to the lateral inked margin which was however negative.  She saw radiation oncology (Drs. Orlene Erm and Valere Dross) who felt adjuvant endocrine therapy alone would be adequate. She started anastrozole under Dr. Jacquiline Doe at [facility], on which she continues.  More recently she underwent routine bilateral diagnostic mammography with tomography and left breast ultrasonography at [facility] on 02/26/2018 showing: highly suspicious 1.6 cm retroareolar left breast mass, and single abnormal left axillary lymph node.  Accordingly on 03/05/2018 she proceeded to biopsy of the left breast area in question. The pathology ([id number]) from this procedure showed: invasive ductal carcinoma, grade 3; ductal carcinoma in the lymph node tissue. Prognostic indicators significant for: estrogen receptor, 95% positive and progesterone receptor, 5% positive, both with strong staining intensity. Proliferation marker Ki67 at 20%. HER2 negative by immunohistochemistry, 1+.  The patient's subsequent history is as detailed below.   PAST MEDICAL HISTORY: Past Medical History:  Diagnosis Date  . Abrasion of arm, left    secondary to fall   . Anemia   . Arthritis   . Breast cancer, left ([place])    er/pr +  . Cataract    no surgery as yet,starting of 3 years  . Chronic kidney disease    renal stones, one episode of Lithotripsy  . Cirrhosis (HCC)    Metavir score 4  . Complication of anesthesia   . Diabetes mellitus   . GERD (gastroesophageal reflux  disease)   . Hyperlipidemia   . Hypertension    followed by [place] grp. relative to  urology study grp.  Doesn't report ever having a stress test   . Mental disorder   . Multiple falls   .  Nocturia   . PONV (postoperative nausea and vomiting)   . Seasonal allergies   . Skin cancer    face- melanoma, 2012- surg. excision      PAST SURGICAL HISTORY: Past Surgical History:  Procedure Laterality Date  . ABDOMINAL HYSTERECTOMY    . BACK SURGERY     x4 back surgery to include rods & screws  . Last in May 2014  . BREAST LUMPECTOMY WITH NEEDLE LOCALIZATION AND AXILLARY SENTINEL LYMPH NODE BX  03/12/2012   Procedure: BREAST LUMPECTOMY WITH NEEDLE LOCALIZATION AND AXILLARY SENTINEL LYMPH NODE BX;  Surgeon: Merrie Roof, MD;  Location: [facility];  Service: General;  Laterality: Left;  . BREAST SURGERY     lumpectomy x2/left   . CHOLECYSTECTOMY    . COLONOSCOPY N/A 11/20/2013   Dr.Rourk- diverticula was found in the left colon. o/w normal rectal, colonic and terminal ileal mucosa  . ESOPHAGOGASTRODUODENOSCOPY N/A 11/20/2013   Dr.Rourk- abnormal mucosa was found. diffuse snake skinning and friability of the gastric mucosa. patent pylorus. abnormal-appearing first, second and third portion of the duodenum. 73m gastric nodule at the GE junction. stomach bx= minimal chronic infammation, GE junction bx= polypoid fragments of gastric cardia type mucosa w/mod chronic inflammation and foveolar hyperplasia  . EYE SURGERY     cataract surgery bilat   . FRACTURE SURGERY     1975- ORIF- L ankle   . GIVENS CAPSULE STUDY N/A 02/24/2014   Procedure: GIVENS CAPSULE STUDY;  Surgeon: RDaneil Dolin MD;  [facility];  Service: Endoscopy;  Laterality: N/A;  . RE-EXCISION OF BREAST LUMPECTOMY  03/22/2012   Procedure: RE-EXCISION OF BREAST LUMPECTOMY;  Surgeon: PMerrie Roof MD;  Location: [facility]  Service: General;  Laterality: Left;  re-excision left breast lumpectomy cavity  ER+, PR+  . ROTATOR CUFF REPAIR Bilateral   . SHOULDER SURGERY    . SKIN BIOPSY    . TOTAL KNEE ARTHROPLASTY Right 12/18/2014   Procedure: RIGHT TOTAL KNEE ARTHROPLASTY;  Surgeon: RSydnee Cabal  MD;  Location: [facility];  Service: Orthopedics;  Laterality: Right;  . TUBAL LIGATION      FAMILY HISTORY: Family History  Problem Relation Age of Onset  . Stroke Mother        mini-strokes  . Heart disease Father   . Emphysema Father   . Cancer Brother        metastatic, unsure primary  . Colon cancer Neg Hx   . Prostate cancer Neg Hx    Patient father was 637years old when he died from "heart trouble" and emphysema. Patient mother died from mini-stroke complications at age 83 She denies a family hx of breast or ovarian cancer, prostate or pancreatic cancers. She has 3 siblings. She had a twin brother who died at age 9115from metastatic cancer. She had 2 sisters. Her only living sibling is a sister.   GYNECOLOGIC HISTORY:  No LMP recorded. Patient has had a hysterectomy. Menarche: 85years old Age at first live birth: 85years [place] 2 L[place] s/p hysterectomy Contraceptive no HRT yes, for more than 5 years  Hysterectomy? Yes, at age [id number]? 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 Her husband retired on disability, but he passed away in 05/19/15 from mini-strokes. She lives at home with 2 cats. Daughter [name], who is in her 70s, is married to [place] (who works for the [place]) and lives very close to the patient.  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  The patient's son died at age 30 from accidental overdose.  The patient attends [employer] in [place].   ADVANCED DIRECTIVES: Daughter [name] is her HCPOA.   HEALTH MAINTENANCE: Social History   Tobacco Use  . Smoking status: Never Smoker  . Smokeless tobacco: Never Used  Substance Use Topics  . Alcohol use: No  . Drug use: No     Colonoscopy: 11/20/2013, Dr. Gala Romney, chronic inflammation  PAP: s/p hysterectomy   Bone density: "had one probably 10 years ago"   Allergies  Allergen  Reactions  . Bee Venom Anaphylaxis and Swelling  . Hydromorphone Hcl Other (See Comments)    Agitation, Confusion  . Demerol Nausea Only    Current Outpatient Medications  Medication Sig Dispense Refill  . ALPHAGAN P 0.1 % SOLN Place 1 drop into both eyes 2 (two) times daily.     [name] Kitchen amLODipine (NORVASC) 5 MG tablet Take 5 mg by mouth every morning.     [name] Kitchen aspirin EC 81 MG tablet Take 81 mg by mouth daily.    [name] Kitchen atorvastatin (LIPITOR) 10 MG tablet Take 10 mg by mouth every evening.     . Bacillus Coagulans-Inulin (PROBIOTIC FORMULA PO) Take 1 capsule by mouth daily.    . cyanocobalamin (,VITAMIN B-12,) 1000 MCG/ML injection Inject 1,000 mcg into the muscle every 30 (thirty) days.     . dorzolamide (TRUSOPT) 2 % ophthalmic solution Place 1 drop into both eyes 2 (two) times daily.   4  . EPIPEN 2-PAK 0.3 MG/0.3ML SOAJ injection Inject 0.3 mg into the muscle as needed for anaphylaxis.     . fulvestrant (FASLODEX) 250 MG/5ML injection Inject 500 mg into the muscle every 14 (fourteen) days. One injection each buttock over 1-2 minutes. Warm prior to use.    . gabapentin (NEURONTIN) 600 MG tablet Take 600 mg by mouth daily.     [name] Kitchen glipiZIDE (GLUCOTROL) 5 MG tablet Take 5 mg by mouth daily before breakfast.     . guaiFENesin-dextromethorphan (ROBITUSSIN DM) 100-10 MG/5ML syrup Take 5 mLs by mouth every 4 (four) hours as needed for cough. 118 mL 0  . hydrALAZINE (APRESOLINE) 25 MG tablet Take 25-50 mg by mouth See admin instructions. Take 50 mg by mouth in the morning and take 25 mg by mouth at bedtime    . ipratropium-albuterol (DUONEB) 0.5-2.5 (3) MG/3ML SOLN Take 3 mLs by nebulization 3 (three) times daily. 360 mL 3  . latanoprost (XALATAN) 0.005 % ophthalmic solution Place 1 drop into both eyes at bedtime.     . metoprolol succinate (TOPROL-XL) 100 MG 24 hr tablet Take 100 mg by mouth every morning. Take with or immediately following a meal.    . mirtazapine (REMERON) 15 MG tablet Take 15 mg by mouth  at bedtime.     [name] Kitchen MYRBETRIQ 50 MG TB24 tablet Take 50 mg by mouth daily.     [name] Kitchen omeprazole (PRILOSEC) 20 MG capsule Take 20 mg by mouth daily.    . Oxycodone HCl 10 MG TABS Take 10 mg by mouth daily.     [name] Kitchen  palbociclib (IBRANCE) 100 MG capsule Take 1 capsule (100 mg total) by mouth daily with breakfast. Take whole with food. Take for 21 days on, 7 days off, repeat every 28 days. 42 capsule 6  . Polyethyl Glycol-Propyl Glycol (SYSTANE ULTRA) 0.4-0.3 % SOLN Place 1-2 drops into both eyes daily as needed (for itching eyes).    . torsemide (DEMADEX) 20 MG tablet Take 1 tablet (20 mg total) by mouth daily. 30 tablet 5  . TRESIBA FLEXTOUCH 100 UNIT/ML SOPN FlexTouch Pen Inject 0.5 mLs (50 Units total) into the skin daily. 5 pen 5   No current facility-administered medications for this visit.     OBJECTIVE: Elderly white woman w using a chair walker  Vitals:   08/13/18 1027  BP: (!) 148/51  Pulse: 71  Resp: 18  Temp: 98.7 F (37.1 C)  SpO2: 93%     Body mass index is 38.71 kg/m.   Wt Readings from Last 3 Encounters:  08/13/18 198 lb 3.2 oz (89.9 kg)  04/26/18 204 lb 8 oz (92.8 kg)  04/23/18 207 lb 0.2 oz (93.9 kg)      ECOG FS:2 - Symptomatic, <50% confined to bed  Sclerae unicteric, EOMs intact No cervical or supraclavicular adenopathy Lungs no rales or rhonchi Heart regular rate and rhythm Abd soft, nontender, positive bowel sounds MSK no focal spinal tenderness, no upper extremity lymphedema Neuro: nonfocal, well oriented, appropriate affect Breasts: The right breast is unremarkable.  The left breast is imaged below.  To the best of my ability to tell there has been some evidence of response  Left breast 08/13/2018     Left breast 04/03/2018     LAB RESULTS:  CMP     Component Value Date/Time   NA 139 08/13/2018 1009   K 4.2 08/13/2018 1009   CL 103 08/13/2018 1009   CO2 27 08/13/2018 1009   GLUCOSE 314 (H) 08/13/2018 1009   BUN 27 (H) 08/13/2018 1009    CREATININE 2.07 (H) 08/13/2018 1009   CALCIUM 8.5 (L) 08/13/2018 1009   PROT 6.8 08/13/2018 1009   ALBUMIN 3.3 (L) 08/13/2018 1009   AST 27 08/13/2018 1009   ALT 15 08/13/2018 1009   ALKPHOS 93 08/13/2018 1009   BILITOT 0.6 08/13/2018 1009   GFRNONAA 22 (L) 08/13/2018 1009   GFRAA 25 (L) 08/13/2018 1009    No results found for: TOTALPROTELP, ALBUMINELP, A1GS, A2GS, BETS, BETA2SER, GAMS, MSPIKE, SPEI  No results found for: KPAFRELGTCHN, LAMBDASER, KAPLAMBRATIO  Lab Results  Component Value Date   WBC 2.1 (L) 08/13/2018   NEUTROABS 1.0 (L) 08/13/2018   HGB 11.2 (L) 08/13/2018   HCT 33.7 (L) 08/13/2018   MCV 107.3 (H) 08/13/2018   PLT 59 (L) 08/13/2018    '@LASTCHEMISTRY'$ @  No results found for: LABCA2  No components found for: NATFTD322  No results for input(s): INR in the last 168 hours.  No results found for: LABCA2  No results found for: CAN199  No results found for: GUR427  No results found for: CWC376  Lab Results  Component Value Date   CA2729 25.6 04/26/2018    No components found for: HGQUANT  No results found for: CEA1 / No results found for: CEA1   No results found for: AFPTUMOR  No results found for: [place]  No results found for: PSA1  Appointment on 08/13/2018  Component Date Value Ref Range Status  . Sodium 08/13/2018 139  135 - 145 mmol/L Final  . Potassium 08/13/2018 4.2  3.5 -  5.1 mmol/L Final  . Chloride 08/13/2018 103  98 - 111 mmol/L Final  . CO2 08/13/2018 27  22 - 32 mmol/L Final  . Glucose, Bld 08/13/2018 314* 70 - 99 mg/dL Final  . BUN 08/13/2018 27* 8 - 23 mg/dL Final  . Creatinine, Ser 08/13/2018 2.07* 0.44 - 1.00 mg/dL Final  . Calcium 08/13/2018 8.5* 8.9 - 10.3 mg/dL Final  . Total Protein 08/13/2018 6.8  6.5 - 8.1 g/dL Final  . Albumin 08/13/2018 3.3* 3.5 - 5.0 g/dL Final  . AST 08/13/2018 27  15 - 41 U/L Final  . ALT 08/13/2018 15  0 - 44 U/L Final  . Alkaline Phosphatase 08/13/2018 93  38 - 126 U/L Final  . Total  Bilirubin 08/13/2018 0.6  0.3 - 1.2 mg/dL Final  . GFR calc non Af Amer 08/13/2018 22* >60 mL/min Final  . GFR calc Af Amer 08/13/2018 25* >60 mL/min Final  . Anion gap 08/13/2018 9  5 - 15 Final   Performed at [facility], [place] [address], [place], [place] [address]  . WBC 08/13/2018 2.1* 4.0 - 10.5 K/uL Final  . RBC 08/13/2018 3.14* 3.87 - 5.11 MIL/uL Final  . Hemoglobin 08/13/2018 11.2* 12.0 - 15.0 g/dL Final  . HCT 08/13/2018 33.7* 36.0 - 46.0 % Final  . MCV 08/13/2018 107.3* 80.0 - 100.0 fL Final  . MCH 08/13/2018 35.7* 26.0 - 34.0 pg Final  . MCHC 08/13/2018 33.2  30.0 - 36.0 g/dL Final  . RDW 08/13/2018 18.2* 11.5 - 15.5 % Final  . Platelets 08/13/2018 59* 150 - 400 K/uL Final  . nRBC 08/13/2018 0.0  0.0 - 0.2 % Final  . Neutrophils Relative % 08/13/2018 47  % Final  . Neutro Abs 08/13/2018 1.0* 1.7 - 7.7 K/uL Final  . Lymphocytes Relative 08/13/2018 41  % Final  . Lymphs Abs 08/13/2018 0.8  0.7 - 4.0 K/uL Final  . Monocytes Relative 08/13/2018 7  % Final  . Monocytes Absolute 08/13/2018 0.1  0.1 - 1.0 K/uL Final  . Eosinophils Relative 08/13/2018 1  % Final  . Eosinophils Absolute 08/13/2018 0.0  0.0 - 0.5 K/uL Final  . Basophils Relative 08/13/2018 3  % Final  . Basophils Absolute 08/13/2018 0.1  0.0 - 0.1 K/uL Final  . WBC Morphology 08/13/2018 VARIANT LYMPHS   Final  . Immature Granulocytes 08/13/2018 1  % Final  . Abs Immature Granulocytes 08/13/2018 0.01  0.00 - 0.07 K/uL Final  . Schistocytes 08/13/2018 PRESENT   Final   Performed at [facility], [place] [address], [place], [place] [address]    (this displays the last labs from the last 3 days)  No results found for: TOTALPROTELP, ALBUMINELP, A1GS, A2GS, BETS, BETA2SER, GAMS, MSPIKE, SPEI (this displays SPEP labs)  No results found for: KPAFRELGTCHN, LAMBDASER, KAPLAMBRATIO (kappa/lambda light chains)  No results found for: HGBA, HGBA2QUANT, HGBFQUANT, HGBSQUAN  (Hemoglobinopathy evaluation)   No results found for: LDH  No results found for: IRON, TIBC, IRONPCTSAT (Iron and TIBC)  No results found for: FERRITIN  Urinalysis    Component Value Date/Time   COLORURINE YELLOW 07/04/2018 1020   APPEARANCEUR CLEAR 07/04/2018 1020   LABSPEC 1.009 07/04/2018 1020   PHURINE 7.0 07/04/2018 1020   GLUCOSEU NEGATIVE 07/04/2018 1020   HGBUR NEGATIVE 07/04/2018 1020   BILIRUBINUR NEGATIVE 07/04/2018 1020   KETONESUR NEGATIVE 07/04/2018 1020   PROTEINUR NEGATIVE 07/04/2018 1020   UROBILINOGEN 1.0 12/11/2014 1106   NITRITE NEGATIVE 07/04/2018  1020   LEUKOCYTESUR SMALL (A) 07/04/2018 1020     STUDIES: No results found.   ELIGIBLE FOR AVAILABLE RESEARCH PROTOCOL: no   ASSESSMENT: 85 y.o. [place] woman status post left ectomy and sentinel lymph node sampling 03/12/2012 for a  pT1b pN0, stage IA invasive ductal carcinoma, grade 3, strongly estrogen and progesterone receptor negative, HER-2 nonamplified, with an MIB-1 of 15%  (a) additional surgery for margin clearance was successful 03/22/2012  (1) the patient opted against adjuvant radiation  (2) anastrozole started March 2014, discontinued 04/03/2018 with evidence of recurrence  RECURRENT DISEASE: February 2020 (3) status post left breast retroareolar biopsy 03/05/2018 for a clinical T1c N1, stage IIA invasive ductal carcinoma, estrogen and progesterone receptor positive, HER-2 not amplified, with an MIB-1 of 20%.  (4) spread to skin/chest wall on left noted 04/03/2018  (a) chest CT 04/11/2018 scan showed nonspecific bilateral axillary adenopathy   (B) bone scan 04/11/2018 shows only degenerative disease  (b) CA 27-29 pending  (d) biopsy of 2 left breast skin nodules 04/04/2018 shows invasive carcinoma, estrogen receptor 100% positive, progesterone receptor and HER-2 negative, with an MIB-1 of 30%.  (5) started fulvestrant 04/12/2018  (a) added palbociclib 04/26/2018 at 125 mg p.o. daily,  21 days on, 7 days off  (b) tamoxifen added and fulvestrant changed to every 8 weeks  PLAN: [name] is tolerating her treatment generally well.  By exam today her breast lesions are flatter, and I do not appreciate any new lesions.  I think she is having a good response.  Normally she would, every 4 weeks for her shot but I am concerned about exposure to the virus in this 85 year old woman with other comorbidities.  In order to minimize that she will have the fulvestrant every 8 weeks.  We have added the tamoxifen to "help that out".  Her neutrophils today, her week off, are 1.0.  I am going to drop her palbociclib dose to 100 mg and I wrote that for the pharmacy here.  She will receive 2 months this time since we are going to hold off on the next fulvestrant dose until 8 weeks from now  She will return to see me in 8 weeks, with labs and treatment at that time  They know to call for any other issue that may develop before the next visit here.   , Virgie Dad, MD  08/13/18 11:03 AM Medical Oncology and Hematology [facility] [address] [place], [place] [address] Tel. [phone]    Fax. [phone]   I, Wilburn Mylar, am acting as scribe for Dr. Virgie Dad. .  I, Lurline Del MD, have reviewed the above documentation for accuracy and completeness, and I agree with the above.

## 2018-08-13 ENCOUNTER — Inpatient Hospital Stay: Payer: Medicare HMO

## 2018-08-13 ENCOUNTER — Other Ambulatory Visit: Payer: Self-pay

## 2018-08-13 ENCOUNTER — Inpatient Hospital Stay: Payer: Medicare HMO | Attending: Oncology | Admitting: Oncology

## 2018-08-13 VITALS — BP 127/71 | HR 80 | Temp 98.0°F | Resp 18

## 2018-08-13 VITALS — BP 148/51 | HR 71 | Temp 98.7°F | Resp 18 | Ht 60.0 in | Wt 198.2 lb

## 2018-08-13 DIAGNOSIS — I129 Hypertensive chronic kidney disease with stage 1 through stage 4 chronic kidney disease, or unspecified chronic kidney disease: Secondary | ICD-10-CM | POA: Insufficient documentation

## 2018-08-13 DIAGNOSIS — C50012 Malignant neoplasm of nipple and areola, left female breast: Secondary | ICD-10-CM | POA: Insufficient documentation

## 2018-08-13 DIAGNOSIS — R5383 Other fatigue: Secondary | ICD-10-CM | POA: Diagnosis not present

## 2018-08-13 DIAGNOSIS — Z7982 Long term (current) use of aspirin: Secondary | ICD-10-CM | POA: Diagnosis not present

## 2018-08-13 DIAGNOSIS — C50312 Malignant neoplasm of lower-inner quadrant of left female breast: Secondary | ICD-10-CM

## 2018-08-13 DIAGNOSIS — C50812 Malignant neoplasm of overlapping sites of left female breast: Secondary | ICD-10-CM

## 2018-08-13 DIAGNOSIS — E1140 Type 2 diabetes mellitus with diabetic neuropathy, unspecified: Secondary | ICD-10-CM

## 2018-08-13 DIAGNOSIS — K219 Gastro-esophageal reflux disease without esophagitis: Secondary | ICD-10-CM | POA: Diagnosis not present

## 2018-08-13 DIAGNOSIS — Z85820 Personal history of malignant melanoma of skin: Secondary | ICD-10-CM | POA: Diagnosis not present

## 2018-08-13 DIAGNOSIS — E1122 Type 2 diabetes mellitus with diabetic chronic kidney disease: Secondary | ICD-10-CM | POA: Diagnosis not present

## 2018-08-13 DIAGNOSIS — Z170 Estrogen receptor positive status [ER+]: Secondary | ICD-10-CM | POA: Diagnosis not present

## 2018-08-13 DIAGNOSIS — M129 Arthropathy, unspecified: Secondary | ICD-10-CM | POA: Insufficient documentation

## 2018-08-13 DIAGNOSIS — R531 Weakness: Secondary | ICD-10-CM | POA: Diagnosis not present

## 2018-08-13 DIAGNOSIS — Z79899 Other long term (current) drug therapy: Secondary | ICD-10-CM | POA: Insufficient documentation

## 2018-08-13 DIAGNOSIS — N189 Chronic kidney disease, unspecified: Secondary | ICD-10-CM | POA: Insufficient documentation

## 2018-08-13 DIAGNOSIS — Z79811 Long term (current) use of aromatase inhibitors: Secondary | ICD-10-CM | POA: Diagnosis not present

## 2018-08-13 DIAGNOSIS — Z809 Family history of malignant neoplasm, unspecified: Secondary | ICD-10-CM | POA: Insufficient documentation

## 2018-08-13 DIAGNOSIS — C50912 Malignant neoplasm of unspecified site of left female breast: Secondary | ICD-10-CM

## 2018-08-13 DIAGNOSIS — Z79818 Long term (current) use of other agents affecting estrogen receptors and estrogen levels: Secondary | ICD-10-CM | POA: Insufficient documentation

## 2018-08-13 DIAGNOSIS — K7460 Unspecified cirrhosis of liver: Secondary | ICD-10-CM | POA: Diagnosis not present

## 2018-08-13 DIAGNOSIS — IMO0002 Reserved for concepts with insufficient information to code with codable children: Secondary | ICD-10-CM

## 2018-08-13 DIAGNOSIS — E785 Hyperlipidemia, unspecified: Secondary | ICD-10-CM | POA: Diagnosis not present

## 2018-08-13 LAB — CBC WITH DIFFERENTIAL/PLATELET
Abs Immature Granulocytes: 0.01 10*3/uL (ref 0.00–0.07)
Basophils Absolute: 0.1 10*3/uL (ref 0.0–0.1)
Basophils Relative: 3 %
Eosinophils Absolute: 0 10*3/uL (ref 0.0–0.5)
Eosinophils Relative: 1 %
HCT: 33.7 % — ABNORMAL LOW (ref 36.0–46.0)
Hemoglobin: 11.2 g/dL — ABNORMAL LOW (ref 12.0–15.0)
Immature Granulocytes: 1 %
Lymphocytes Relative: 41 %
Lymphs Abs: 0.8 10*3/uL (ref 0.7–4.0)
MCH: 35.7 pg — ABNORMAL HIGH (ref 26.0–34.0)
MCHC: 33.2 g/dL (ref 30.0–36.0)
MCV: 107.3 fL — ABNORMAL HIGH (ref 80.0–100.0)
Monocytes Absolute: 0.1 10*3/uL (ref 0.1–1.0)
Monocytes Relative: 7 %
Neutro Abs: 1 10*3/uL — ABNORMAL LOW (ref 1.7–7.7)
Neutrophils Relative %: 47 %
Platelets: 59 10*3/uL — ABNORMAL LOW (ref 150–400)
RBC: 3.14 MIL/uL — ABNORMAL LOW (ref 3.87–5.11)
RDW: 18.2 % — ABNORMAL HIGH (ref 11.5–15.5)
WBC: 2.1 10*3/uL — ABNORMAL LOW (ref 4.0–10.5)
nRBC: 0 % (ref 0.0–0.2)

## 2018-08-13 LAB — COMPREHENSIVE METABOLIC PANEL
ALT: 15 U/L (ref 0–44)
AST: 27 U/L (ref 15–41)
Albumin: 3.3 g/dL — ABNORMAL LOW (ref 3.5–5.0)
Alkaline Phosphatase: 93 U/L (ref 38–126)
Anion gap: 9 (ref 5–15)
BUN: 27 mg/dL — ABNORMAL HIGH (ref 8–23)
CO2: 27 mmol/L (ref 22–32)
Calcium: 8.5 mg/dL — ABNORMAL LOW (ref 8.9–10.3)
Chloride: 103 mmol/L (ref 98–111)
Creatinine, Ser: 2.07 mg/dL — ABNORMAL HIGH (ref 0.44–1.00)
GFR calc Af Amer: 25 mL/min — ABNORMAL LOW (ref >60)
GFR calc non Af Amer: 22 mL/min — ABNORMAL LOW (ref >60)
Glucose, Bld: 314 mg/dL — ABNORMAL HIGH (ref 70–99)
Potassium: 4.2 mmol/L (ref 3.5–5.1)
Sodium: 139 mmol/L (ref 135–145)
Total Bilirubin: 0.6 mg/dL (ref 0.3–1.2)
Total Protein: 6.8 g/dL (ref 6.5–8.1)

## 2018-08-13 MED ORDER — PALBOCICLIB 100 MG PO CAPS: 100.0000 mg | ORAL_CAPSULE | Freq: Every day | ORAL | 6 refills | Status: DC

## 2018-08-13 MED ORDER — FULVESTRANT 250 MG/5ML IM SOLN
INTRAMUSCULAR | Status: AC
  Filled 2018-08-13: qty 5

## 2018-08-13 MED ORDER — FULVESTRANT 250 MG/5ML IM SOLN
500.0000 mg | Freq: Once | INTRAMUSCULAR | Status: AC
  Administered 2018-08-13: 12:00:00 500 mg via INTRAMUSCULAR

## 2018-08-13 MED FILL — IBRANCE 100 MG CAPSULE: 100 | 28 days supply | Qty: 21 | Fill #0

## 2018-08-13 NOTE — Patient Instructions (Signed)

## 2018-08-16 ENCOUNTER — Telehealth: Payer: Self-pay | Admitting: Oncology

## 2018-08-16 NOTE — Telephone Encounter (Signed)
I could not reach patient °

## 2018-08-23 DIAGNOSIS — R69 Illness, unspecified: Secondary | ICD-10-CM | POA: Diagnosis not present

## 2018-08-27 ENCOUNTER — Other Ambulatory Visit: Payer: Self-pay

## 2018-08-27 ENCOUNTER — Ambulatory Visit: Payer: Medicare HMO | Admitting: Podiatry

## 2018-08-27 ENCOUNTER — Encounter: Payer: Self-pay | Admitting: Podiatry

## 2018-08-27 VITALS — Temp 98.3°F

## 2018-08-27 DIAGNOSIS — E1140 Type 2 diabetes mellitus with diabetic neuropathy, unspecified: Secondary | ICD-10-CM | POA: Diagnosis not present

## 2018-08-27 DIAGNOSIS — E1165 Type 2 diabetes mellitus with hyperglycemia: Secondary | ICD-10-CM

## 2018-08-27 DIAGNOSIS — M79676 Pain in unspecified toe(s): Secondary | ICD-10-CM

## 2018-08-27 DIAGNOSIS — E119 Type 2 diabetes mellitus without complications: Secondary | ICD-10-CM | POA: Diagnosis not present

## 2018-08-27 DIAGNOSIS — B351 Tinea unguium: Secondary | ICD-10-CM | POA: Diagnosis not present

## 2018-08-27 DIAGNOSIS — IMO0002 Reserved for concepts with insufficient information to code with codable children: Secondary | ICD-10-CM

## 2018-08-27 NOTE — Patient Instructions (Signed)
Diabetes Mellitus and Foot Care Foot care is an important part of your health, especially when you have diabetes. Diabetes may cause you to have problems because of poor blood flow (circulation) to your feet and legs, which can cause your skin to:  Become thinner and drier.  Break more easily.  Heal more slowly.  Peel and crack. You may also have nerve damage (neuropathy) in your legs and feet, causing decreased feeling in them. This means that you may not notice minor injuries to your feet that could lead to more serious problems. Noticing and addressing any potential problems early is the best way to prevent future foot problems. How to care for your feet Foot hygiene  Wash your feet daily with warm water and mild soap. Do not use hot water. Then, pat your feet and the areas between your toes until they are completely dry. Do not soak your feet as this can dry your skin.  Trim your toenails straight across. Do not dig under them or around the cuticle. File the edges of your nails with an emery board or nail file.  Apply a moisturizing lotion or petroleum jelly to the skin on your feet and to dry, brittle toenails. Use lotion that does not contain alcohol and is unscented. Do not apply lotion between your toes. Shoes and socks  Wear clean socks or stockings every day. Make sure they are not too tight. Do not wear knee-high stockings since they may decrease blood flow to your legs.  Wear shoes that fit properly and have enough cushioning. Always look in your shoes before you put them on to be sure there are no objects inside.  To break in new shoes, wear them for just a few hours a day. This prevents injuries on your feet. Wounds, scrapes, corns, and calluses  Check your feet daily for blisters, cuts, bruises, sores, and redness. If you cannot see the bottom of your feet, use a mirror or ask someone for help.  Do not cut corns or calluses or try to remove them with medicine.  If you  find a minor scrape, cut, or break in the skin on your feet, keep it and the skin around it clean and dry. You may clean these areas with mild soap and water. Do not clean the area with peroxide, alcohol, or iodine.  If you have a wound, scrape, corn, or callus on your foot, look at it several times a day to make sure it is healing and not infected. Check for: ? Redness, swelling, or pain. ? Fluid or blood. ? Warmth. ? Pus or a bad smell. General instructions  Do not cross your legs. This may decrease blood flow to your feet.  Do not use heating pads or hot water bottles on your feet. They may burn your skin. If you have lost feeling in your feet or legs, you may not know this is happening until it is too late.  Protect your feet from hot and cold by wearing shoes, such as at the beach or on hot pavement.  Schedule a complete foot exam at least once a year (annually) or more often if you have foot problems. If you have foot problems, report any cuts, sores, or bruises to your health care provider immediately. Contact a health care provider if:  You have a medical condition that increases your risk of infection and you have any cuts, sores, or bruises on your feet.  You have an injury that is not   healing.  You have redness on your legs or feet.  You feel burning or tingling in your legs or feet.  You have pain or cramps in your legs and feet.  Your legs or feet are numb.  Your feet always feel cold.  You have pain around a toenail. Get help right away if:  You have a wound, scrape, corn, or callus on your foot and: ? You have pain, swelling, or redness that gets worse. ? You have fluid or blood coming from the wound, scrape, corn, or callus. ? Your wound, scrape, corn, or callus feels warm to the touch. ? You have pus or a bad smell coming from the wound, scrape, corn, or callus. ? You have a fever. ? You have a red line going up your leg. Summary  Check your feet every day  for cuts, sores, red spots, swelling, and blisters.  Moisturize feet and legs daily.  Wear shoes that fit properly and have enough cushioning.  If you have foot problems, report any cuts, sores, or bruises to your health care provider immediately.  Schedule a complete foot exam at least once a year (annually) or more often if you have foot problems. This information is not intended to replace advice given to you by your health care provider. Make sure you discuss any questions you have with your health care provider. Document Released: 02/11/2000 Document Revised: 03/28/2017 Document Reviewed: 03/17/2016 Elsevier Patient Education  2020 [employer]  

## 2018-08-28 ENCOUNTER — Telehealth: Payer: Self-pay | Admitting: Pharmacist

## 2018-08-28 DIAGNOSIS — C50812 Malignant neoplasm of overlapping sites of left female breast: Secondary | ICD-10-CM

## 2018-08-28 DIAGNOSIS — Z170 Estrogen receptor positive status [ER+]: Secondary | ICD-10-CM

## 2018-08-28 MED ORDER — PALBOCICLIB 100 MG PO TABS: 100.0000 mg | ORAL_TABLET | Freq: Every day | ORAL | 6 refills | Status: DC

## 2018-08-28 NOTE — Telephone Encounter (Signed)
Oral Oncology Pharmacist Encounter  Ibrance (palbociclib) is changing formulation from capsules to tablets. New Ibrance prescription for the 100 mg tablets has been E scribed to the [facility].  Johny Drilling, PharmD, BCPS, BCOP  08/28/2018  2:36 PM [facility] [phone]

## 2018-08-30 DIAGNOSIS — I890 Lymphedema, not elsewhere classified: Secondary | ICD-10-CM | POA: Diagnosis not present

## 2018-08-31 NOTE — Progress Notes (Signed)
Subjective: [name] presents with diabetes, diabetic neuropathy and cc of painful, discolored, thick toenails which interfere with activities of daily living. Pain is aggravated when wearing enclosed shoe gear. Pain is relieved with periodic professional debridement.  Sinda Du, MD is her PCP. Last visit 07/04/2018.   Current Outpatient Medications:  .  ALPHAGAN P 0.1 % SOLN, Place 1 drop into both eyes 2 (two) times daily. , Disp: , Rfl:  .  amLODipine (NORVASC) 5 MG tablet, Take 5 mg by mouth every morning. , Disp: , Rfl:  .  aspirin EC 81 MG tablet, Take 81 mg by mouth daily., Disp: , Rfl:  .  atorvastatin (LIPITOR) 10 MG tablet, Take 10 mg by mouth every evening. , Disp: , Rfl:  .  Bacillus Coagulans-Inulin (PROBIOTIC FORMULA PO), Take 1 capsule by mouth daily., Disp: , Rfl:  .  BD PEN NEEDLE NANO U/F 32G X 4 MM MISC, , Disp: , Rfl:  .  cyanocobalamin (,VITAMIN B-12,) 1000 MCG/ML injection, Inject 1,000 mcg into the muscle every 30 (thirty) days. , Disp: , Rfl:  .  dorzolamide (TRUSOPT) 2 % ophthalmic solution, Place 1 drop into both eyes 2 (two) times daily. , Disp: , Rfl: 4 .  EPIPEN 2-PAK 0.3 MG/0.3ML SOAJ injection, Inject 0.3 mg into the muscle as needed for anaphylaxis. , Disp: , Rfl:  .  fulvestrant (FASLODEX) 250 MG/5ML injection, Inject 500 mg into the muscle every 14 (fourteen) days. One injection each buttock over 1-2 minutes. Warm prior to use., Disp: , Rfl:  .  gabapentin (NEURONTIN) 600 MG tablet, Take 600 mg by mouth daily. , Disp: , Rfl:  .  glipiZIDE (GLUCOTROL) 5 MG tablet, Take 5 mg by mouth daily before breakfast. , Disp: , Rfl:  .  guaiFENesin-dextromethorphan (ROBITUSSIN DM) 100-10 MG/5ML syrup, Take 5 mLs by mouth every 4 (four) hours as needed for cough., Disp: 118 mL, Rfl: 0 .  hydrALAZINE (APRESOLINE) 25 MG tablet, Take 25-50 mg by mouth See admin instructions. Take 50 mg by mouth in the morning and take 25 mg by mouth at bedtime, Disp: , Rfl:  .   ipratropium-albuterol (DUONEB) 0.5-2.5 (3) MG/3ML SOLN, Take 3 mLs by nebulization 3 (three) times daily., Disp: 360 mL, Rfl: 3 .  Lancets (ONETOUCH DELICA PLUS [id number]) MISC, , Disp: , Rfl:  .  latanoprost (XALATAN) 0.005 % ophthalmic solution, Place 1 drop into both eyes at bedtime. , Disp: , Rfl:  .  metoprolol succinate (TOPROL-XL) 100 MG 24 hr tablet, Take 100 mg by mouth every morning. Take with or immediately following a meal., Disp: , Rfl:  .  mirtazapine (REMERON) 15 MG tablet, Take 15 mg by mouth at bedtime. , Disp: , Rfl:  .  MYRBETRIQ 50 MG TB24 tablet, Take 50 mg by mouth daily. , Disp: , Rfl:  .  omeprazole (PRILOSEC) 20 MG capsule, Take 20 mg by mouth daily., Disp: , Rfl:  .  ONETOUCH ULTRA test strip, , Disp: , Rfl:  .  Oxycodone HCl 10 MG TABS, Take 10 mg by mouth daily. , Disp: , Rfl:  .  Polyethyl Glycol-Propyl Glycol (SYSTANE ULTRA) 0.4-0.3 % SOLN, Place 1-2 drops into both eyes daily as needed (for itching eyes)., Disp: , Rfl:  .  torsemide (DEMADEX) 20 MG tablet, Take 1 tablet (20 mg total) by mouth daily., Disp: 30 tablet, Rfl: 5 .  TRESIBA FLEXTOUCH 100 UNIT/ML SOPN FlexTouch Pen, Inject 0.5 mLs (50 Units total) into the skin daily., Disp:  5 pen, Rfl: 5 .  palbociclib (IBRANCE) 100 MG tablet, Take 1 tablet (100 mg total) by mouth daily. Take for 21 days on, 7 days off, repeat every 28 days., Disp: 21 tablet, Rfl: 6  Allergies  Allergen Reactions  . Bee Venom Anaphylaxis and Swelling  . Hydromorphone Hcl Other (See Comments)    Agitation, Confusion  . Demerol Nausea Only    Objective: Vitals:   08/27/18 1603  Temp: 98.3 F (36.8 C)    Vascular Examination: Capillary refill time <3 seconds x 10 digits.  Dorsalis pedis pulses palpable b/l.  Posterior tibial pulses palpable b/l.  Digital hair absent x 10 digits.  Skin temperature gradient WNL b/l.  Chronic edema BLE with b/l LE velcro leg wraps  Dermatological Examination: Skin with normal turgor,  texture and tone b/l.  Toenails 1-5 b/l discolored, thick, dystrophic with subungual debris and pain with palpation to nailbeds due to thickness of nails.  Musculoskeletal: Muscle strength 5/5 b/l to all LE muscle groups.  No pain, crepitus or joint limitation with passive/active ROM.  Neurological: Sensation diminished with 10 gram monofilament.  Vibratory sensation diminished b/l.  Assessment: 1. Painful onychomycosis toenails 1-5 b/l 2. NIDDM with Diabetic neuropathy 3. Encounter for diabetic foot examination  Plan: 1. Continue diabetic foot care principles. Literature dispensed on today. 2. Toenails 1-5 b/l were debrided in length and girth without iatrogenic bleeding. 3. Calluses pared submetatarsal head(s) utilizing sterile scalpel blade without incident. Corn(s) pared utilizing sterile scalpel blade without incident.  4. Patient to continue soft, supportive shoe gear 5. Patient to report any pedal injuries to medical professional  6. Follow up 3 months.  7. Patient/POA to call should there be a concern in the interim.

## 2018-09-12 MED FILL — IBRANCE 100 MG TABS: 100 | 28 days supply | Qty: 21 | Fill #0

## 2018-09-17 DIAGNOSIS — E113293 Type 2 diabetes mellitus with mild nonproliferative diabetic retinopathy without macular edema, bilateral: Secondary | ICD-10-CM | POA: Diagnosis not present

## 2018-09-17 DIAGNOSIS — H401132 Primary open-angle glaucoma, bilateral, moderate stage: Secondary | ICD-10-CM | POA: Diagnosis not present

## 2018-09-17 DIAGNOSIS — Z961 Presence of intraocular lens: Secondary | ICD-10-CM | POA: Diagnosis not present

## 2018-10-07 NOTE — Progress Notes (Signed)
[name]  [phone] Fax:([phone]) [phone]    ID: [name] DOB: Mar 22, 1939  MR#: [id number]  EUM#:[id number]  Patient Care Team: Sinda Du, MD as PCP - General (Internal Medicine) Gala Romney, Cristopher Estimable, MD as Consulting Physician (Gastroenterology) Jastin Fore, Virgie Dad, MD as Consulting Physician (Oncology) Jovita Kussmaul, MD as Consulting Physician (General Surgery) Gery Pray, MD as Consulting Physician (Radiation Oncology) Erline Levine, MD as Consulting Physician (Neurosurgery) Chauncey Cruel, MD OTHER MD:   CHIEF COMPLAINT: Recurrent estrogen receptor positive breast cancer  CURRENT TREATMENT: FULVESTRANT; PALBOCICLIB; TAMOXIFEN   INTERVAL HISTORY: [name] returns today for follow-up and treatment of her recurrent estrogen receptor positive breast cancer. Her daughter [name] participated in today's visit via telephone.  She is  She continues on palbociclib with good tolerance.   She continues on fulvestrant, now every 8 weeks, with a dose due today. She denies any soreness with the injection, noting the staff does a very good job.  She continues on tamoxifen with good tolerance. She reports mouth sores/blisters. She uses a lip balm that helps. She denies hot flashes and vaginal wetness. She does note her hair is thinning.  Since her last visit, she has not undergone any additional studies. She will be due for repeat mammogram in 01/2019. She gets these done at [facility].   REVIEW OF SYSTEMS: Adaleigh reports she walks around the house, watches TV, and reads. She notes she cannot walk without her rolling walker. She notes she has gained weight since she is not moving as much. She lives with her daughter, who works from home. Her daughter does the shopping for them and is being careful when she goes out. A detailed review of systems was otherwise entirely negative.   HISTORY OF CURRENT ILLNESS: From the original intake note   [name] has a history of left breast invasive ductal carcinoma,  status post left lumpectomy and sentinel lymph node sampling 03/12/2012 for a pT1b pN0, stage IA invasive ductal carcinoma, grade 3, strongly estrogen and progesterone receptor positive, HER-2 nonamplified, with an MIB-1-1 of 15%.  The single sentinel lymph node was negative.  There was a positive margin.  She had reexcision on 03/22/2012 for what proved to be a single residual focus of intermediate grade ductal carcinoma in situ.  This was close to the lateral inked margin which was however negative.  She saw radiation oncology (Drs. Orlene Erm and Valere Dross) who felt adjuvant endocrine therapy alone would be adequate. She started anastrozole under Dr. Jacquiline Doe at [facility], on which she continues.  More recently she underwent routine bilateral diagnostic mammography with tomography and left breast ultrasonography at [facility] on 02/26/2018 showing: highly suspicious 1.6 cm retroareolar left breast mass, and single abnormal left axillary lymph node.  Accordingly on 03/05/2018 she proceeded to biopsy of the left breast area in question. The pathology ([id number]) from this procedure showed: invasive ductal carcinoma, grade 3; ductal carcinoma in the lymph node tissue. Prognostic indicators significant for: estrogen receptor, 95% positive and progesterone receptor, 5% positive, both with strong staining intensity. Proliferation marker Ki67 at 20%. HER2 negative by immunohistochemistry, 1+.  The patient's subsequent history is as detailed below.   PAST MEDICAL HISTORY: Past Medical History:  Diagnosis Date  . Abrasion of arm, left    secondary to fall   . Anemia   . Arthritis   . Breast cancer, left ([place])    er/pr +  . Cataract    no surgery as  yet,starting of 3 years  . Chronic kidney disease    renal stones, one episode of Lithotripsy  . Cirrhosis (HCC)    Metavir score 4  . Complication of anesthesia   .  Diabetes mellitus   . GERD (gastroesophageal reflux disease)   . Hyperlipidemia   . Hypertension    followed by [facility]. relative to  urology study grp.  Doesn't report ever having a stress test   . Mental disorder   . Multiple falls   . Nocturia   . PONV (postoperative nausea and vomiting)   . Seasonal allergies   . Skin cancer    face- melanoma, 2012- surg. excision      PAST SURGICAL HISTORY: Past Surgical History:  Procedure Laterality Date  . ABDOMINAL HYSTERECTOMY    . BACK SURGERY     x4 back surgery to include rods & screws  . Last in May 2014  . BREAST LUMPECTOMY WITH NEEDLE LOCALIZATION AND AXILLARY SENTINEL LYMPH NODE BX  03/12/2012   Procedure: BREAST LUMPECTOMY WITH NEEDLE LOCALIZATION AND AXILLARY SENTINEL LYMPH NODE BX;  Surgeon: Merrie Roof, MD;  Location: [facility];  Service: General;  Laterality: Left;  . BREAST SURGERY     lumpectomy x2/left   . CHOLECYSTECTOMY    . COLONOSCOPY N/A 11/20/2013   Dr.Rourk- diverticula was found in the left colon. o/w normal rectal, colonic and terminal ileal mucosa  . ESOPHAGOGASTRODUODENOSCOPY N/A 11/20/2013   Dr.Rourk- abnormal mucosa was found. diffuse snake skinning and friability of the gastric mucosa. patent pylorus. abnormal-appearing first, second and third portion of the duodenum. 17m gastric nodule at the GE junction. stomach bx= minimal chronic infammation, GE junction bx= polypoid fragments of gastric cardia type mucosa w/mod chronic inflammation and foveolar hyperplasia  . EYE SURGERY     cataract surgery bilat   . FRACTURE SURGERY     1975- ORIF- L ankle   . GIVENS CAPSULE STUDY N/A 02/24/2014   Procedure: GIVENS CAPSULE STUDY;  Surgeon: RDaneil Dolin MD;  [facility];  Service: Endoscopy;  Laterality: N/A;  . RE-EXCISION OF BREAST LUMPECTOMY  03/22/2012   Procedure: RE-EXCISION OF BREAST LUMPECTOMY;  Surgeon: PMerrie Roof MD;  Location: [facility]  Service: General;  Laterality:  Left;  re-excision left breast lumpectomy cavity  ER+, PR+  . ROTATOR CUFF REPAIR Bilateral   . SHOULDER SURGERY    . SKIN BIOPSY    . TOTAL KNEE ARTHROPLASTY Right 12/18/2014   Procedure: RIGHT TOTAL KNEE ARTHROPLASTY;  Surgeon: RSydnee Cabal MD;  Location: [facility];  Service: Orthopedics;  Laterality: Right;  . TUBAL LIGATION      FAMILY HISTORY: Family History  Problem Relation Age of Onset  . Stroke Mother        mini-strokes  . Heart disease Father   . Emphysema Father   . Cancer Brother        metastatic, unsure primary  . Colon cancer Neg Hx   . Prostate cancer Neg Hx    Patient father was 610years old when he died from "heart trouble" and emphysema. Patient mother died from mini-stroke complications at age 83 She denies a family hx of breast or ovarian cancer, prostate or pancreatic cancers. She has 3 siblings. She had a twin brother who died at age 5875from metastatic cancer. She had 2 sisters. Her only living sibling is a sister.   GYNECOLOGIC HISTORY:  No LMP recorded. Patient has had a hysterectomy.  Menarche: 85 years old Age at first live birth: 84 years old [place] P 2 LMP s/p hysterectomy Contraceptive no HRT yes, for more than 5 years  Hysterectomy? Yes, at age 53 BSO? 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 Her husband retired on disability, but he passed away in 2015/05/01 from mini-strokes. She lives at home with 2 cats. Daughter [name], who is in her 2s, is married to Attu [place] (who works for the [place]) and lives very close to the patient.  [name] works for the [employer] division of social services from home.  The patient's son died at age 60 from accidental overdose.  The patient attends [employer] in [place].   ADVANCED DIRECTIVES: Daughter [name] is her HCPOA.   HEALTH MAINTENANCE: Social History   Tobacco Use  . Smoking status: Never Smoker  .  Smokeless tobacco: Never Used  Substance Use Topics  . Alcohol use: No  . Drug use: No     Colonoscopy: 11/20/2013, Dr. Gala Romney, chronic inflammation  PAP: s/p hysterectomy   Bone density: "had one probably 10 years ago"   Allergies  Allergen Reactions  . Bee Venom Anaphylaxis and Swelling  . Hydromorphone Hcl Other (See Comments)    Agitation, Confusion  . Demerol Nausea Only    Current Outpatient Medications  Medication Sig Dispense Refill  . acyclovir (ZOVIRAX) 400 MG tablet Take 1 tablet (400 mg total) by mouth 2 (two) times daily. 120 tablet 3  . ALPHAGAN P 0.1 % SOLN Place 1 drop into both eyes 2 (two) times daily.     [name] Kitchen amLODipine (NORVASC) 5 MG tablet Take 5 mg by mouth every morning.     [name] Kitchen aspirin EC 81 MG tablet Take 81 mg by mouth daily.    [name] Kitchen atorvastatin (LIPITOR) 10 MG tablet Take 10 mg by mouth every evening.     . Bacillus Coagulans-Inulin (PROBIOTIC FORMULA PO) Take 1 capsule by mouth daily.    . BD PEN NEEDLE NANO U/F 32G X 4 MM MISC     . cyanocobalamin (,VITAMIN B-12,) 1000 MCG/ML injection Inject 1,000 mcg into the muscle every 30 (thirty) days.     . dorzolamide (TRUSOPT) 2 % ophthalmic solution Place 1 drop into both eyes 2 (two) times daily.   4  . EPIPEN 2-PAK 0.3 MG/0.3ML SOAJ injection Inject 0.3 mg into the muscle as needed for anaphylaxis.     . fulvestrant (FASLODEX) 250 MG/5ML injection Inject 500 mg into the muscle every 14 (fourteen) days. One injection each buttock over 1-2 minutes. Warm prior to use.    . gabapentin (NEURONTIN) 600 MG tablet Take 600 mg by mouth daily.     [name] Kitchen glipiZIDE (GLUCOTROL) 5 MG tablet Take 5 mg by mouth daily before breakfast.     . guaiFENesin-dextromethorphan (ROBITUSSIN DM) 100-10 MG/5ML syrup Take 5 mLs by mouth every 4 (four) hours as needed for cough. 118 mL 0  . hydrALAZINE (APRESOLINE) 25 MG tablet Take 25-50 mg by mouth See admin instructions. Take 50 mg by mouth in the morning and take 25 mg by mouth at bedtime    .  ipratropium-albuterol (DUONEB) 0.5-2.5 (3) MG/3ML SOLN Take 3 mLs by nebulization 3 (three) times daily. 360 mL 3  . Lancets (ONETOUCH DELICA PLUS [id number]) MISC     . latanoprost (XALATAN) 0.005 % ophthalmic solution Place 1 drop into both eyes at bedtime.     [name] Kitchen  metoprolol succinate (TOPROL-XL) 100 MG 24 hr tablet Take 100 mg by mouth every morning. Take with or immediately following a meal.    . mirtazapine (REMERON) 15 MG tablet Take 15 mg by mouth at bedtime.     [name] Kitchen MYRBETRIQ 50 MG TB24 tablet Take 50 mg by mouth daily.     [name] Kitchen omeprazole (PRILOSEC) 20 MG capsule Take 20 mg by mouth daily.    Glory Rosebush ULTRA test strip     . Oxycodone HCl 10 MG TABS Take 10 mg by mouth daily.     . palbociclib (IBRANCE) 75 MG tablet Take 1 tablet (75 mg total) by mouth daily. Take for 21 days on, 7 days off, repeat every 28 days. 21 tablet 6  . Polyethyl Glycol-Propyl Glycol (SYSTANE ULTRA) 0.4-0.3 % SOLN Place 1-2 drops into both eyes daily as needed (for itching eyes).    . tamoxifen (NOLVADEX) 20 MG tablet Take 1 tablet (20 mg total) by mouth daily. 90 tablet 12  . torsemide (DEMADEX) 20 MG tablet Take 1 tablet (20 mg total) by mouth daily. 30 tablet 5  . TRESIBA FLEXTOUCH 100 UNIT/ML SOPN FlexTouch Pen Inject 0.5 mLs (50 Units total) into the skin daily. 5 pen 5   No current facility-administered medications for this visit.     OBJECTIVE: Elderly white woman w using a chair walker  Vitals:   10/08/18 1203  BP: (!) 120/53  Pulse: 82  Resp: 18  Temp: 98.5 F (36.9 C)  SpO2: 100%     Body mass index is 39.74 kg/m.   Wt Readings from Last 3 Encounters:  10/08/18 203 lb 8 oz (92.3 kg)  08/13/18 198 lb 3.2 oz (89.9 kg)  04/26/18 204 lb 8 oz (92.8 kg)      ECOG FS:2 - Symptomatic, <50% confined to bed  Sclerae unicteric, EOMs intact Wearing a mask No cervical or supraclavicular adenopathy Lungs no rales or rhonchi Heart regular rate and rhythm Abd soft, nontender, positive bowel sounds MSK  no focal spinal tenderness, no upper extremity lymphedema Neuro: nonfocal, well oriented, appropriate affect Breasts: The right breast is unremarkable.  The left breast is imaged below.  It is difficult for me to compared to prior.  It is possible that there is a slightly better demarcated area medially.   Left Breast 10/08/2018     Left breast 08/13/2018     Left breast 04/03/2018     LAB RESULTS:  CMP     Component Value Date/Time   NA 139 08/13/2018 1009   K 4.2 08/13/2018 1009   CL 103 08/13/2018 1009   CO2 27 08/13/2018 1009   GLUCOSE 314 (H) 08/13/2018 1009   BUN 27 (H) 08/13/2018 1009   CREATININE 2.07 (H) 08/13/2018 1009   CALCIUM 8.5 (L) 08/13/2018 1009   PROT 6.8 08/13/2018 1009   ALBUMIN 3.3 (L) 08/13/2018 1009   AST 27 08/13/2018 1009   ALT 15 08/13/2018 1009   ALKPHOS 93 08/13/2018 1009   BILITOT 0.6 08/13/2018 1009   GFRNONAA 22 (L) 08/13/2018 1009   GFRAA 25 (L) 08/13/2018 1009    No results found for: TOTALPROTELP, ALBUMINELP, A1GS, A2GS, BETS, BETA2SER, GAMS, MSPIKE, SPEI  No results found for: KPAFRELGTCHN, LAMBDASER, KAPLAMBRATIO  Lab Results  Component Value Date   WBC 2.3 (L) 10/08/2018   NEUTROABS 1.2 (L) 10/08/2018   HGB 10.7 (L) 10/08/2018   HCT 32.0 (L) 10/08/2018   MCV 115.5 (H) 10/08/2018   PLT 67 (L) 10/08/2018    @  LASTCHEMISTRY@  No results found for: LABCA2  No components found for: WGNFAO130  No results for input(s): INR in the last 168 hours.  No results found for: LABCA2  No results found for: CAN199  No results found for: QMV784  No results found for: ONG295  Lab Results  Component Value Date   CA2729 25.6 04/26/2018    No components found for: HGQUANT  No results found for: CEA1 / No results found for: CEA1   No results found for: AFPTUMOR  No results found for: [place]  No results found for: PSA1  Appointment on 10/08/2018  Component Date Value Ref Range Status  . WBC 10/08/2018 2.3* 4.0 -  10.5 K/uL Final  . RBC 10/08/2018 2.77* 3.87 - 5.11 MIL/uL Final  . Hemoglobin 10/08/2018 10.7* 12.0 - 15.0 g/dL Final  . HCT 10/08/2018 32.0* 36.0 - 46.0 % Final  . MCV 10/08/2018 115.5* 80.0 - 100.0 fL Final  . MCH 10/08/2018 38.6* 26.0 - 34.0 pg Final  . MCHC 10/08/2018 33.4  30.0 - 36.0 g/dL Final  . RDW 10/08/2018 16.5* 11.5 - 15.5 % Final  . Platelets 10/08/2018 67* 150 - 400 K/uL Final  . nRBC 10/08/2018 0.0  0.0 - 0.2 % Final  . Neutrophils Relative % 10/08/2018 51  % Final  . Neutro Abs 10/08/2018 1.2* 1.7 - 7.7 K/uL Final  . Lymphocytes Relative 10/08/2018 35  % Final  . Lymphs Abs 10/08/2018 0.8  0.7 - 4.0 K/uL Final  . Monocytes Relative 10/08/2018 10  % Final  . Monocytes Absolute 10/08/2018 0.2  0.1 - 1.0 K/uL Final  . Eosinophils Relative 10/08/2018 1  % Final  . Eosinophils Absolute 10/08/2018 0.0  0.0 - 0.5 K/uL Final  . Basophils Relative 10/08/2018 3  % Final  . Basophils Absolute 10/08/2018 0.1  0.0 - 0.1 K/uL Final  . Immature Granulocytes 10/08/2018 0  % Final  . Abs Immature Granulocytes 10/08/2018 0.00  0.00 - 0.07 K/uL Final   Performed at [facility], [place] [address] [place], [place] [address]    (this displays the last labs from the last 3 days)  No results found for: TOTALPROTELP, ALBUMINELP, A1GS, A2GS, BETS, BETA2SER, GAMS, MSPIKE, SPEI (this displays SPEP labs)  No results found for: KPAFRELGTCHN, LAMBDASER, KAPLAMBRATIO (kappa/lambda light chains)  No results found for: HGBA, HGBA2QUANT, HGBFQUANT, HGBSQUAN (Hemoglobinopathy evaluation)   No results found for: LDH  No results found for: IRON, TIBC, IRONPCTSAT (Iron and TIBC)  No results found for: FERRITIN  Urinalysis    Component Value Date/Time   COLORURINE YELLOW 07/04/2018 1020   APPEARANCEUR CLEAR 07/04/2018 1020   LABSPEC 1.009 07/04/2018 1020   PHURINE 7.0 07/04/2018 1020   GLUCOSEU NEGATIVE 07/04/2018 1020   HGBUR NEGATIVE 07/04/2018 1020    BILIRUBINUR NEGATIVE 07/04/2018 1020   KETONESUR NEGATIVE 07/04/2018 1020   PROTEINUR NEGATIVE 07/04/2018 1020   UROBILINOGEN 1.0 12/11/2014 1106   NITRITE NEGATIVE 07/04/2018 1020   LEUKOCYTESUR SMALL (A) 07/04/2018 1020     STUDIES: No results found.   ELIGIBLE FOR AVAILABLE RESEARCH PROTOCOL: no   ASSESSMENT: 85 y.o.  woman status post left ectomy and sentinel lymph node sampling 03/12/2012 for a  pT1b pN0, stage IA invasive ductal carcinoma, grade 3, strongly estrogen and progesterone receptor negative, HER-2 nonamplified, with an MIB-1 of 15%  (a) additional surgery for margin clearance was successful 03/22/2012  (1) the patient opted against adjuvant radiation  (2) anastrozole started March 2014, discontinued 04/03/2018 with  evidence of recurrence  RECURRENT DISEASE: February 2020 (3) status post left breast retroareolar biopsy 03/05/2018 for a clinical T1c N1, stage IIA invasive ductal carcinoma, estrogen and progesterone receptor positive, HER-2 not amplified, with an MIB-1 of 20%.  (4) spread to skin/chest wall on left noted 04/03/2018  (a) chest CT 04/11/2018 scan showed nonspecific bilateral axillary adenopathy   (B) bone scan 04/11/2018 shows only degenerative disease  (b) CA 27-29 not informative 9 was 25.6 on 04/26/2018)  (d) biopsy of 2 left breast skin nodules 04/04/2018 shows invasive carcinoma, estrogen receptor 100% positive, progesterone receptor and HER-2 negative, with an MIB-1 of 30%.  (5) started fulvestrant 04/12/2018  (a) added palbociclib 04/26/2018 at 125 mg p.o. daily, 21 days on, 7 days off  (b) tamoxifen added and fulvestrant changed to every 8 weeks after 08/13/2018 dose to minimize exposure during the pandemic  PLAN: Tarren looks very well overall and is certainly tolerating her treatment well except for the problem with sores in her mouth.  She understands this is due to her immune system being weakened most likely by the palbociclib.   I have refilled the acyclovir that was started by Dr. Luan Pulling.  This is working for her.  I am also dropping the palbociclib dose.  Platelets are low but stable.  She has not had any bleeding issues. Results for AVI, ARCHULETA (MRN [id number]) as of 10/08/2018 12:01  Ref. Range 04/18/2018 04:51 04/26/2018 12:35 07/04/2018 10:20 08/13/2018 10:09 10/08/2018 11:46  Platelets Latest Ref Range: 150 - 400 K/uL 146 (L) 204 104 (L) 59 (L) 67 (L)   It is difficult for me to tell whether the treatments are working.  I am going to set her up for a CT scan in 7weeks just to make sure we have more objective measurements of where we are.  She receives fulvestrant today and she will receive it again in 8 weeks.  She is tolerating tamoxifen well and we will continue that at least for now.  They are taking appropriate pandemic precautions.  They know to call for any other issue that may develop before the next visit.   Ileta Ofarrell, Virgie Dad, MD  10/08/18 12:21 PM Medical Oncology and Hematology [facility] [address] [place], [place] [address] Tel. [phone]    Fax. [phone]   I, Wilburn Mylar, am acting as scribe for Dr. Virgie Dad. Victoriya Pol.  I, Lurline Del MD, have reviewed the above documentation for accuracy and completeness, and I agree with the above.

## 2018-10-08 ENCOUNTER — Inpatient Hospital Stay: Payer: Medicare HMO | Attending: Oncology

## 2018-10-08 ENCOUNTER — Inpatient Hospital Stay (HOSPITAL_BASED_OUTPATIENT_CLINIC_OR_DEPARTMENT_OTHER): Payer: Medicare HMO | Admitting: Oncology

## 2018-10-08 ENCOUNTER — Inpatient Hospital Stay: Payer: Medicare HMO

## 2018-10-08 ENCOUNTER — Other Ambulatory Visit: Payer: Self-pay

## 2018-10-08 VITALS — BP 120/53 | HR 82 | Temp 98.5°F | Resp 18 | Ht 60.0 in | Wt 203.5 lb

## 2018-10-08 DIAGNOSIS — E785 Hyperlipidemia, unspecified: Secondary | ICD-10-CM | POA: Insufficient documentation

## 2018-10-08 DIAGNOSIS — C50312 Malignant neoplasm of lower-inner quadrant of left female breast: Secondary | ICD-10-CM

## 2018-10-08 DIAGNOSIS — Z79818 Long term (current) use of other agents affecting estrogen receptors and estrogen levels: Secondary | ICD-10-CM | POA: Diagnosis not present

## 2018-10-08 DIAGNOSIS — Z79899 Other long term (current) drug therapy: Secondary | ICD-10-CM | POA: Diagnosis not present

## 2018-10-08 DIAGNOSIS — Z79810 Long term (current) use of selective estrogen receptor modulators (SERMs): Secondary | ICD-10-CM | POA: Insufficient documentation

## 2018-10-08 DIAGNOSIS — C50812 Malignant neoplasm of overlapping sites of left female breast: Secondary | ICD-10-CM

## 2018-10-08 DIAGNOSIS — C50912 Malignant neoplasm of unspecified site of left female breast: Secondary | ICD-10-CM

## 2018-10-08 DIAGNOSIS — Z7982 Long term (current) use of aspirin: Secondary | ICD-10-CM | POA: Insufficient documentation

## 2018-10-08 DIAGNOSIS — E119 Type 2 diabetes mellitus without complications: Secondary | ICD-10-CM | POA: Insufficient documentation

## 2018-10-08 DIAGNOSIS — Z8673 Personal history of transient ischemic attack (TIA), and cerebral infarction without residual deficits: Secondary | ICD-10-CM | POA: Diagnosis not present

## 2018-10-08 DIAGNOSIS — N189 Chronic kidney disease, unspecified: Secondary | ICD-10-CM | POA: Insufficient documentation

## 2018-10-08 DIAGNOSIS — K1379 Other lesions of oral mucosa: Secondary | ICD-10-CM | POA: Diagnosis not present

## 2018-10-08 DIAGNOSIS — D649 Anemia, unspecified: Secondary | ICD-10-CM | POA: Diagnosis not present

## 2018-10-08 DIAGNOSIS — Z8041 Family history of malignant neoplasm of ovary: Secondary | ICD-10-CM | POA: Insufficient documentation

## 2018-10-08 DIAGNOSIS — Z170 Estrogen receptor positive status [ER+]: Secondary | ICD-10-CM | POA: Diagnosis not present

## 2018-10-08 DIAGNOSIS — M129 Arthropathy, unspecified: Secondary | ICD-10-CM | POA: Diagnosis not present

## 2018-10-08 DIAGNOSIS — I129 Hypertensive chronic kidney disease with stage 1 through stage 4 chronic kidney disease, or unspecified chronic kidney disease: Secondary | ICD-10-CM | POA: Insufficient documentation

## 2018-10-08 DIAGNOSIS — C50012 Malignant neoplasm of nipple and areola, left female breast: Secondary | ICD-10-CM | POA: Diagnosis present

## 2018-10-08 DIAGNOSIS — IMO0002 Reserved for concepts with insufficient information to code with codable children: Secondary | ICD-10-CM

## 2018-10-08 DIAGNOSIS — I10 Essential (primary) hypertension: Secondary | ICD-10-CM | POA: Diagnosis not present

## 2018-10-08 DIAGNOSIS — K219 Gastro-esophageal reflux disease without esophagitis: Secondary | ICD-10-CM | POA: Insufficient documentation

## 2018-10-08 DIAGNOSIS — E1140 Type 2 diabetes mellitus with diabetic neuropathy, unspecified: Secondary | ICD-10-CM

## 2018-10-08 LAB — CBC WITH DIFFERENTIAL/PLATELET
Abs Immature Granulocytes: 0 10*3/uL (ref 0.00–0.07)
Basophils Absolute: 0.1 10*3/uL (ref 0.0–0.1)
Basophils Relative: 3 %
Eosinophils Absolute: 0 10*3/uL (ref 0.0–0.5)
Eosinophils Relative: 1 %
HCT: 32 % — ABNORMAL LOW (ref 36.0–46.0)
Hemoglobin: 10.7 g/dL — ABNORMAL LOW (ref 12.0–15.0)
Immature Granulocytes: 0 %
Lymphocytes Relative: 35 %
Lymphs Abs: 0.8 10*3/uL (ref 0.7–4.0)
MCH: 38.6 pg — ABNORMAL HIGH (ref 26.0–34.0)
MCHC: 33.4 g/dL (ref 30.0–36.0)
MCV: 115.5 fL — ABNORMAL HIGH (ref 80.0–100.0)
Monocytes Absolute: 0.2 10*3/uL (ref 0.1–1.0)
Monocytes Relative: 10 %
Neutro Abs: 1.2 10*3/uL — ABNORMAL LOW (ref 1.7–7.7)
Neutrophils Relative %: 51 %
Platelets: 67 10*3/uL — ABNORMAL LOW (ref 150–400)
RBC: 2.77 MIL/uL — ABNORMAL LOW (ref 3.87–5.11)
RDW: 16.5 % — ABNORMAL HIGH (ref 11.5–15.5)
WBC: 2.3 10*3/uL — ABNORMAL LOW (ref 4.0–10.5)
nRBC: 0 % (ref 0.0–0.2)

## 2018-10-08 LAB — CMP (CANCER CENTER ONLY)
ALT: 15 U/L (ref 0–44)
AST: 27 U/L (ref 15–41)
Albumin: 3.5 g/dL (ref 3.5–5.0)
Alkaline Phosphatase: 76 U/L (ref 38–126)
Anion gap: 9 (ref 5–15)
BUN: 24 mg/dL — ABNORMAL HIGH (ref 8–23)
CO2: 29 mmol/L (ref 22–32)
Calcium: 8.6 mg/dL — ABNORMAL LOW (ref 8.9–10.3)
Chloride: 103 mmol/L (ref 98–111)
Creatinine: 2.18 mg/dL — ABNORMAL HIGH (ref 0.44–1.00)
GFR, Est AFR Am: 24 mL/min — ABNORMAL LOW (ref >60)
GFR, Estimated: 20 mL/min — ABNORMAL LOW (ref >60)
Glucose, Bld: 261 mg/dL — ABNORMAL HIGH (ref 70–99)
Potassium: 4.4 mmol/L (ref 3.5–5.1)
Sodium: 141 mmol/L (ref 135–145)
Total Bilirubin: 1 mg/dL (ref 0.3–1.2)
Total Protein: 7 g/dL (ref 6.5–8.1)

## 2018-10-08 MED ORDER — PALBOCICLIB 75 MG PO TABS: 75.0000 mg | ORAL_TABLET | Freq: Every day | ORAL | 6 refills | Status: DC

## 2018-10-08 MED ORDER — FULVESTRANT 250 MG/5ML IM SOLN
500.0000 mg | Freq: Once | INTRAMUSCULAR | Status: AC
  Administered 2018-10-08: 500 mg via INTRAMUSCULAR

## 2018-10-08 MED ORDER — ACYCLOVIR 400 MG PO TABS: 400.0000 mg | ORAL_TABLET | Freq: Two times a day (BID) | ORAL | 3 refills | Status: DC

## 2018-10-08 MED ORDER — FULVESTRANT 250 MG/5ML IM SOLN
INTRAMUSCULAR | Status: AC
  Filled 2018-10-08: qty 5

## 2018-10-08 MED ORDER — TAMOXIFEN CITRATE 20 MG PO TABS: 20.0000 mg | ORAL_TABLET | Freq: Every day | ORAL | 12 refills | Status: AC

## 2018-10-08 MED FILL — IBRANCE 75 MG TABS: 75 | 28 days supply | Qty: 21 | Fill #0

## 2018-10-08 NOTE — Patient Instructions (Signed)
Fulvestrant injection What is this medicine? FULVESTRANT (ful VES trant) blocks the effects of estrogen. It is used to treat breast cancer. This medicine may be used for other purposes; ask your health care provider or pharmacist if you have questions. COMMON BRAND NAME(S): FASLODEX What should I tell my health care provider before I take this medicine? They need to know if you have any of these conditions:  bleeding disorders  liver disease  low blood counts, like low white cell, platelet, or red cell counts  an unusual or allergic reaction to fulvestrant, other medicines, foods, dyes, or preservatives  pregnant or trying to get pregnant  breast-feeding How should I use this medicine? This medicine is for injection into a muscle. It is usually given by a health care professional in a hospital or clinic setting. Talk to your pediatrician regarding the use of this medicine in children. Special care may be needed. Overdosage: If you think you have taken too much of this medicine contact a poison control center or emergency room at once. NOTE: This medicine is only for you. Do not share this medicine with others. What if I miss a dose? It is important not to miss your dose. Call your doctor or health care professional if you are unable to keep an appointment. What may interact with this medicine?  medicines that treat or prevent blood clots like warfarin, enoxaparin, dalteparin, apixaban, dabigatran, and rivaroxaban This list may not describe all possible interactions. Give your health care provider a list of all the medicines, herbs, non-prescription drugs, or dietary supplements you use. Also tell them if you smoke, drink alcohol, or use illegal drugs. Some items may interact with your medicine. What should I watch for while using this medicine? Your condition will be monitored carefully while you are receiving this medicine. You will need important blood work done while you are taking  this medicine. Do not become pregnant while taking this medicine or for at least 1 year after stopping it. Women of child-bearing potential will need to have a negative pregnancy test before starting this medicine. Women should inform their doctor if they wish to become pregnant or think they might be pregnant. There is a potential for serious side effects to an unborn child. Men should inform their doctors if they wish to father a child. This medicine may lower sperm counts. Talk to your health care professional or pharmacist for more information. Do not breast-feed an infant while taking this medicine or for 1 year after the last dose. What side effects may I notice from receiving this medicine? Side effects that you should report to your doctor or health care professional as soon as possible:  allergic reactions like skin rash, itching or hives, swelling of the face, lips, or tongue  feeling faint or lightheaded, falls  pain, tingling, numbness, or weakness in the legs  signs and symptoms of infection like fever or chills; cough; flu-like symptoms; sore throat  vaginal bleeding Side effects that usually do not require medical attention (report to your doctor or health care professional if they continue or are bothersome):  aches, pains  constipation  diarrhea  headache  hot flashes  nausea, vomiting  pain at site where injected  stomach pain This list may not describe all possible side effects. Call your doctor for medical advice about side effects. You may report side effects to FDA at 1-800-FDA-1088. Where should I keep my medicine? This drug is given in a hospital or clinic and will   not be stored at home. NOTE: This sheet is a summary. It may not cover all possible information. If you have questions about this medicine, talk to your doctor, pharmacist, or health care provider.  2020 Elsevier/Gold Standard (2017-05-24 11:34:41)  

## 2018-10-09 ENCOUNTER — Telehealth: Payer: Self-pay | Admitting: Oncology

## 2018-10-09 NOTE — Telephone Encounter (Signed)
I left a message regarding schedule  

## 2018-10-16 DIAGNOSIS — I129 Hypertensive chronic kidney disease with stage 1 through stage 4 chronic kidney disease, or unspecified chronic kidney disease: Secondary | ICD-10-CM | POA: Diagnosis not present

## 2018-10-16 DIAGNOSIS — C50919 Malignant neoplasm of unspecified site of unspecified female breast: Secondary | ICD-10-CM | POA: Diagnosis not present

## 2018-10-16 DIAGNOSIS — E1121 Type 2 diabetes mellitus with diabetic nephropathy: Secondary | ICD-10-CM | POA: Diagnosis not present

## 2018-10-16 DIAGNOSIS — N183 Chronic kidney disease, stage 3 (moderate): Secondary | ICD-10-CM | POA: Diagnosis not present

## 2018-10-22 DIAGNOSIS — E1165 Type 2 diabetes mellitus with hyperglycemia: Secondary | ICD-10-CM | POA: Diagnosis not present

## 2018-10-26 ENCOUNTER — Other Ambulatory Visit: Payer: Self-pay

## 2018-10-26 ENCOUNTER — Emergency Department (HOSPITAL_COMMUNITY): Payer: Medicare HMO

## 2018-10-26 ENCOUNTER — Inpatient Hospital Stay (HOSPITAL_COMMUNITY)
Admission: EM | Admit: 2018-10-26 | Discharge: 2018-10-31 | DRG: 193 | Disposition: A | Discharge status: Home Health | Payer: Medicare HMO | Attending: Pulmonary Disease | Admitting: Pulmonary Disease

## 2018-10-26 ENCOUNTER — Encounter (HOSPITAL_COMMUNITY): Payer: Self-pay

## 2018-10-26 DIAGNOSIS — J9691 Respiratory failure, unspecified with hypoxia: Secondary | ICD-10-CM | POA: Diagnosis present

## 2018-10-26 DIAGNOSIS — J9601 Acute respiratory failure with hypoxia: Secondary | ICD-10-CM | POA: Diagnosis not present

## 2018-10-26 DIAGNOSIS — R0902 Hypoxemia: Secondary | ICD-10-CM

## 2018-10-26 DIAGNOSIS — J42 Unspecified chronic bronchitis: Secondary | ICD-10-CM | POA: Diagnosis present

## 2018-10-26 DIAGNOSIS — Z87442 Personal history of urinary calculi: Secondary | ICD-10-CM

## 2018-10-26 DIAGNOSIS — K219 Gastro-esophageal reflux disease without esophagitis: Secondary | ICD-10-CM | POA: Diagnosis present

## 2018-10-26 DIAGNOSIS — K7460 Unspecified cirrhosis of liver: Secondary | ICD-10-CM | POA: Diagnosis present

## 2018-10-26 DIAGNOSIS — C50912 Malignant neoplasm of unspecified site of left female breast: Secondary | ICD-10-CM | POA: Diagnosis present

## 2018-10-26 DIAGNOSIS — Z20828 Contact with and (suspected) exposure to other viral communicable diseases: Secondary | ICD-10-CM | POA: Diagnosis not present

## 2018-10-26 DIAGNOSIS — Z85820 Personal history of malignant melanoma of skin: Secondary | ICD-10-CM

## 2018-10-26 DIAGNOSIS — R509 Fever, unspecified: Secondary | ICD-10-CM

## 2018-10-26 DIAGNOSIS — Z66 Do not resuscitate: Secondary | ICD-10-CM | POA: Diagnosis present

## 2018-10-26 DIAGNOSIS — I517 Cardiomegaly: Secondary | ICD-10-CM | POA: Diagnosis not present

## 2018-10-26 DIAGNOSIS — N184 Chronic kidney disease, stage 4 (severe): Secondary | ICD-10-CM | POA: Diagnosis present

## 2018-10-26 DIAGNOSIS — N390 Urinary tract infection, site not specified: Secondary | ICD-10-CM | POA: Diagnosis present

## 2018-10-26 DIAGNOSIS — E1165 Type 2 diabetes mellitus with hyperglycemia: Secondary | ICD-10-CM | POA: Diagnosis not present

## 2018-10-26 DIAGNOSIS — R000 Tachycardia, unspecified: Secondary | ICD-10-CM | POA: Diagnosis not present

## 2018-10-26 DIAGNOSIS — R161 Splenomegaly, not elsewhere classified: Secondary | ICD-10-CM | POA: Diagnosis not present

## 2018-10-26 DIAGNOSIS — Z853 Personal history of malignant neoplasm of breast: Secondary | ICD-10-CM

## 2018-10-26 DIAGNOSIS — J984 Other disorders of lung: Secondary | ICD-10-CM | POA: Diagnosis not present

## 2018-10-26 DIAGNOSIS — G9340 Encephalopathy, unspecified: Secondary | ICD-10-CM | POA: Diagnosis present

## 2018-10-26 DIAGNOSIS — J189 Pneumonia, unspecified organism: Secondary | ICD-10-CM | POA: Diagnosis not present

## 2018-10-26 DIAGNOSIS — Z823 Family history of stroke: Secondary | ICD-10-CM

## 2018-10-26 DIAGNOSIS — I10 Essential (primary) hypertension: Secondary | ICD-10-CM | POA: Diagnosis present

## 2018-10-26 DIAGNOSIS — Z96651 Presence of right artificial knee joint: Secondary | ICD-10-CM | POA: Diagnosis present

## 2018-10-26 DIAGNOSIS — E119 Type 2 diabetes mellitus without complications: Secondary | ICD-10-CM

## 2018-10-26 DIAGNOSIS — Z90710 Acquired absence of both cervix and uterus: Secondary | ICD-10-CM

## 2018-10-26 DIAGNOSIS — Z7984 Long term (current) use of oral hypoglycemic drugs: Secondary | ICD-10-CM

## 2018-10-26 DIAGNOSIS — E785 Hyperlipidemia, unspecified: Secondary | ICD-10-CM | POA: Diagnosis present

## 2018-10-26 DIAGNOSIS — I129 Hypertensive chronic kidney disease with stage 1 through stage 4 chronic kidney disease, or unspecified chronic kidney disease: Secondary | ICD-10-CM | POA: Diagnosis present

## 2018-10-26 DIAGNOSIS — Z9049 Acquired absence of other specified parts of digestive tract: Secondary | ICD-10-CM

## 2018-10-26 DIAGNOSIS — Z825 Family history of asthma and other chronic lower respiratory diseases: Secondary | ICD-10-CM

## 2018-10-26 DIAGNOSIS — G9341 Metabolic encephalopathy: Secondary | ICD-10-CM | POA: Diagnosis present

## 2018-10-26 DIAGNOSIS — E1122 Type 2 diabetes mellitus with diabetic chronic kidney disease: Secondary | ICD-10-CM | POA: Diagnosis present

## 2018-10-26 DIAGNOSIS — Z923 Personal history of irradiation: Secondary | ICD-10-CM

## 2018-10-26 DIAGNOSIS — Z8249 Family history of ischemic heart disease and other diseases of the circulatory system: Secondary | ICD-10-CM

## 2018-10-26 DIAGNOSIS — Z79899 Other long term (current) drug therapy: Secondary | ICD-10-CM

## 2018-10-26 LAB — CBC WITH DIFFERENTIAL/PLATELET
Abs Immature Granulocytes: 0.04 10*3/uL (ref 0.00–0.07)
Basophils Absolute: 0 10*3/uL (ref 0.0–0.1)
Basophils Relative: 1 %
Eosinophils Absolute: 0 10*3/uL (ref 0.0–0.5)
Eosinophils Relative: 0 %
HCT: 32.9 % — ABNORMAL LOW (ref 36.0–46.0)
Hemoglobin: 11.1 g/dL — ABNORMAL LOW (ref 12.0–15.0)
Immature Granulocytes: 1 %
Lymphocytes Relative: 20 %
Lymphs Abs: 0.9 10*3/uL (ref 0.7–4.0)
MCH: 39.2 pg — ABNORMAL HIGH (ref 26.0–34.0)
MCHC: 33.7 g/dL (ref 30.0–36.0)
MCV: 116.3 fL — ABNORMAL HIGH (ref 80.0–100.0)
Monocytes Absolute: 0.3 10*3/uL (ref 0.1–1.0)
Monocytes Relative: 7 %
Neutro Abs: 3.1 10*3/uL (ref 1.7–7.7)
Neutrophils Relative %: 71 %
Platelets: 101 10*3/uL — ABNORMAL LOW (ref 150–400)
RBC: 2.83 MIL/uL — ABNORMAL LOW (ref 3.87–5.11)
RDW: 15.2 % (ref 11.5–15.5)
WBC: 4.3 10*3/uL (ref 4.0–10.5)
nRBC: 0 % (ref 0.0–0.2)

## 2018-10-26 LAB — COMPREHENSIVE METABOLIC PANEL
ALT: 23 U/L (ref 0–44)
AST: 52 U/L — ABNORMAL HIGH (ref 15–41)
Albumin: 3.3 g/dL — ABNORMAL LOW (ref 3.5–5.0)
Alkaline Phosphatase: 74 U/L (ref 38–126)
Anion gap: 11 (ref 5–15)
BUN: 32 mg/dL — ABNORMAL HIGH (ref 8–23)
CO2: 27 mmol/L (ref 22–32)
Calcium: 8.7 mg/dL — ABNORMAL LOW (ref 8.9–10.3)
Chloride: 99 mmol/L (ref 98–111)
Creatinine, Ser: 2.34 mg/dL — ABNORMAL HIGH (ref 0.44–1.00)
GFR calc Af Amer: 22 mL/min — ABNORMAL LOW (ref >60)
GFR calc non Af Amer: 19 mL/min — ABNORMAL LOW (ref >60)
Glucose, Bld: 270 mg/dL — ABNORMAL HIGH (ref 70–99)
Potassium: 3.9 mmol/L (ref 3.5–5.1)
Sodium: 137 mmol/L (ref 135–145)
Total Bilirubin: 1 mg/dL (ref 0.3–1.2)
Total Protein: 6.6 g/dL (ref 6.5–8.1)

## 2018-10-26 LAB — GLUCOSE, CAPILLARY
Glucose-Capillary: 252 mg/dL — ABNORMAL HIGH (ref 70–99)
Glucose-Capillary: 332 mg/dL — ABNORMAL HIGH (ref 70–99)

## 2018-10-26 LAB — LIPASE, BLOOD: Lipase: 19 U/L (ref 11–51)

## 2018-10-26 LAB — LACTIC ACID, PLASMA
Lactic Acid, Venous: 1.9 mmol/L (ref 0.5–1.9)
Lactic Acid, Venous: 1.7 mmol/L (ref 0.5–1.9)

## 2018-10-26 LAB — URINALYSIS, ROUTINE W REFLEX MICROSCOPIC
Bilirubin Urine: NEGATIVE
Glucose, UA: 150 mg/dL — AB
Hgb urine dipstick: NEGATIVE
Ketones, ur: NEGATIVE mg/dL
Leukocytes,Ua: NEGATIVE
Nitrite: NEGATIVE
Protein, ur: NEGATIVE mg/dL
Specific Gravity, Urine: 1.012 (ref 1.005–1.030)
pH: 7 (ref 5.0–8.0)

## 2018-10-26 LAB — SARS CORONAVIRUS 2 BY RT PCR (HOSPITAL ORDER, PERFORMED IN ~~LOC~~ HOSPITAL LAB): SARS Coronavirus 2: NEGATIVE

## 2018-10-26 LAB — AMMONIA: Ammonia: 29 umol/L (ref 9–35)

## 2018-10-26 LAB — CBG MONITORING, ED: Glucose-Capillary: 198 mg/dL — ABNORMAL HIGH (ref 70–99)

## 2018-10-26 MED ORDER — SODIUM CHLORIDE 0.9 % IV SOLN
INTRAVENOUS | Status: DC
  Administered 2018-10-26 – 2018-10-29 (×4): via INTRAVENOUS

## 2018-10-26 MED ORDER — TAMOXIFEN CITRATE 10 MG PO TABS
20.0000 mg | ORAL_TABLET | Freq: Every day | ORAL | Status: DC
  Administered 2018-10-26 – 2018-10-31 (×6): 20 mg via ORAL
  Filled 2018-10-26 (×9): qty 2

## 2018-10-26 MED ORDER — ACYCLOVIR 800 MG PO TABS
400.0000 mg | ORAL_TABLET | Freq: Two times a day (BID) | ORAL | Status: DC
  Administered 2018-10-26 – 2018-10-31 (×10): 400 mg via ORAL
  Filled 2018-10-26 (×10): qty 1

## 2018-10-26 MED ORDER — ACETAMINOPHEN 650 MG RE SUPP: 650.0000 mg | Freq: Four times a day (QID) | RECTAL | Status: DC | PRN

## 2018-10-26 MED ORDER — MIRTAZAPINE 15 MG PO TABS
15.0000 mg | ORAL_TABLET | Freq: Every day | ORAL | Status: DC
  Administered 2018-10-27 – 2018-10-30 (×4): 15 mg via ORAL
  Filled 2018-10-26 (×5): qty 1

## 2018-10-26 MED ORDER — BRIMONIDINE TARTRATE 0.2 % OP SOLN
1.0000 [drp] | Freq: Two times a day (BID) | OPHTHALMIC | Status: DC
  Administered 2018-10-26 – 2018-10-31 (×10): 1 [drp] via OPHTHALMIC
  Filled 2018-10-26 (×3): qty 5

## 2018-10-26 MED ORDER — ALBUTEROL SULFATE (2.5 MG/3ML) 0.083% IN NEBU: 2.5000 mg | INHALATION_SOLUTION | RESPIRATORY_TRACT | Status: DC | PRN

## 2018-10-26 MED ORDER — GUAIFENESIN-DM 100-10 MG/5ML PO SYRP: 5.0000 mL | ORAL_SOLUTION | ORAL | Status: DC | PRN

## 2018-10-26 MED ORDER — PANTOPRAZOLE SODIUM 40 MG PO TBEC
40.0000 mg | DELAYED_RELEASE_TABLET | Freq: Every day | ORAL | Status: DC
  Administered 2018-10-26 – 2018-10-31 (×6): 40 mg via ORAL
  Filled 2018-10-26 (×6): qty 1

## 2018-10-26 MED ORDER — ATORVASTATIN CALCIUM 10 MG PO TABS
10.0000 mg | ORAL_TABLET | Freq: Every evening | ORAL | Status: DC
  Administered 2018-10-26 – 2018-10-30 (×5): 10 mg via ORAL
  Filled 2018-10-26 (×5): qty 1

## 2018-10-26 MED ORDER — HEPARIN SODIUM (PORCINE) 5000 UNIT/ML IJ SOLN
5000.0000 [IU] | Freq: Three times a day (TID) | INTRAMUSCULAR | Status: DC
  Administered 2018-10-26 – 2018-10-31 (×14): 5000 [IU] via SUBCUTANEOUS
  Filled 2018-10-26 (×14): qty 1

## 2018-10-26 MED ORDER — SODIUM CHLORIDE 0.9 % IV SOLN
2.0000 g | INTRAVENOUS | Status: AC
  Administered 2018-10-26 – 2018-10-30 (×5): 2 g via INTRAVENOUS
  Filled 2018-10-26 (×5): qty 20

## 2018-10-26 MED ORDER — INSULIN GLARGINE 100 UNIT/ML ~~LOC~~ SOLN
50.0000 [IU] | Freq: Every day | SUBCUTANEOUS | Status: DC
  Administered 2018-10-26 – 2018-10-31 (×6): 50 [IU] via SUBCUTANEOUS
  Filled 2018-10-26 (×8): qty 0.5

## 2018-10-26 MED ORDER — ONDANSETRON HCL 4 MG PO TABS: 4.0000 mg | ORAL_TABLET | Freq: Four times a day (QID) | ORAL | Status: DC | PRN

## 2018-10-26 MED ORDER — LATANOPROST 0.005 % OP SOLN
1.0000 [drp] | Freq: Every day | OPHTHALMIC | Status: DC
  Administered 2018-10-26 – 2018-10-30 (×5): 1 [drp] via OPHTHALMIC
  Filled 2018-10-26 (×2): qty 2.5

## 2018-10-26 MED ORDER — PREDNISONE 20 MG PO TABS
40.0000 mg | ORAL_TABLET | Freq: Every day | ORAL | Status: DC
  Administered 2018-10-26 – 2018-10-31 (×6): 40 mg via ORAL
  Filled 2018-10-26 (×7): qty 2

## 2018-10-26 MED ORDER — METHOCARBAMOL 500 MG PO TABS: 500.0000 mg | ORAL_TABLET | Freq: Two times a day (BID) | ORAL | Status: DC | PRN

## 2018-10-26 MED ORDER — MIRABEGRON ER 25 MG PO TB24
25.0000 mg | ORAL_TABLET | Freq: Every day | ORAL | Status: DC
  Administered 2018-10-27 – 2018-10-31 (×5): 25 mg via ORAL
  Filled 2018-10-26 (×8): qty 1

## 2018-10-26 MED ORDER — METOPROLOL SUCCINATE ER 50 MG PO TB24
100.0000 mg | ORAL_TABLET | Freq: Every day | ORAL | Status: DC
  Administered 2018-10-26 – 2018-10-31 (×6): 100 mg via ORAL
  Filled 2018-10-26 (×6): qty 2

## 2018-10-26 MED ORDER — HYDRALAZINE HCL 20 MG/ML IJ SOLN: 10.0000 mg | Freq: Four times a day (QID) | INTRAMUSCULAR | Status: DC | PRN

## 2018-10-26 MED ORDER — IPRATROPIUM-ALBUTEROL 0.5-2.5 (3) MG/3ML IN SOLN
3.0000 mL | Freq: Three times a day (TID) | RESPIRATORY_TRACT | Status: DC
  Administered 2018-10-26 – 2018-10-31 (×14): 3 mL via RESPIRATORY_TRACT
  Filled 2018-10-26 (×14): qty 3

## 2018-10-26 MED ORDER — ACETAMINOPHEN 325 MG PO TABS
650.0000 mg | ORAL_TABLET | Freq: Once | ORAL | Status: AC
  Administered 2018-10-26: 650 mg via ORAL
  Filled 2018-10-26: qty 2

## 2018-10-26 MED ORDER — TRAZODONE HCL 50 MG PO TABS: 50.0000 mg | ORAL_TABLET | Freq: Every evening | ORAL | Status: DC | PRN

## 2018-10-26 MED ORDER — SODIUM CHLORIDE 0.9% FLUSH
3.0000 mL | Freq: Two times a day (BID) | INTRAVENOUS | Status: DC
  Administered 2018-10-28 – 2018-10-31 (×5): 3 mL via INTRAVENOUS

## 2018-10-26 MED ORDER — ACETAMINOPHEN 325 MG PO TABS
650.0000 mg | ORAL_TABLET | Freq: Four times a day (QID) | ORAL | Status: DC | PRN
  Administered 2018-10-29 – 2018-10-30 (×3): 650 mg via ORAL
  Filled 2018-10-26 (×3): qty 2

## 2018-10-26 MED ORDER — ONDANSETRON HCL 4 MG/2ML IJ SOLN: 4.0000 mg | Freq: Four times a day (QID) | INTRAMUSCULAR | Status: DC | PRN

## 2018-10-26 MED ORDER — ASPIRIN EC 81 MG PO TBEC
81.0000 mg | DELAYED_RELEASE_TABLET | Freq: Every day | ORAL | Status: DC
  Administered 2018-10-26 – 2018-10-31 (×6): 81 mg via ORAL
  Filled 2018-10-26 (×6): qty 1

## 2018-10-26 MED ORDER — PALBOCICLIB 75 MG PO TABS
75.0000 mg | ORAL_TABLET | Freq: Every day | ORAL | Status: DC
  Administered 2018-10-26 – 2018-10-31 (×6): 75 mg via ORAL
  Filled 2018-10-26: qty 1

## 2018-10-26 MED ORDER — SODIUM CHLORIDE 0.9 % IV SOLN: 250.0000 mL | INTRAVENOUS | Status: DC | PRN

## 2018-10-26 MED ORDER — CYANOCOBALAMIN 1000 MCG/ML IJ SOLN: 1000.0000 ug | INTRAMUSCULAR | Status: DC

## 2018-10-26 MED ORDER — POLYETHYLENE GLYCOL 3350 17 G PO PACK
17.0000 g | PACK | Freq: Every day | ORAL | Status: DC | PRN
  Administered 2018-10-27 – 2018-10-29 (×2): 17 g via ORAL
  Filled 2018-10-26 (×2): qty 1

## 2018-10-26 MED ORDER — DORZOLAMIDE HCL 2 % OP SOLN
1.0000 [drp] | Freq: Two times a day (BID) | OPHTHALMIC | Status: DC
  Administered 2018-10-26 – 2018-10-31 (×10): 1 [drp] via OPHTHALMIC
  Filled 2018-10-26 (×4): qty 10

## 2018-10-26 MED ORDER — INSULIN ASPART 100 UNIT/ML ~~LOC~~ SOLN
0.0000 [IU] | Freq: Every day | SUBCUTANEOUS | Status: DC
  Administered 2018-10-26: 4 [IU] via SUBCUTANEOUS

## 2018-10-26 MED ORDER — POLYVINYL ALCOHOL 1.4 % OP SOLN
1.0000 [drp] | Freq: Every day | OPHTHALMIC | Status: DC | PRN
  Filled 2018-10-26: qty 15

## 2018-10-26 MED ORDER — IPRATROPIUM-ALBUTEROL 0.5-2.5 (3) MG/3ML IN SOLN
3.0000 mL | Freq: Three times a day (TID) | RESPIRATORY_TRACT | Status: DC
  Filled 2018-10-26: qty 3

## 2018-10-26 MED ORDER — SODIUM CHLORIDE 0.9 % IV SOLN
500.0000 mg | INTRAVENOUS | Status: AC
  Administered 2018-10-26 – 2018-10-30 (×5): 500 mg via INTRAVENOUS
  Filled 2018-10-26 (×5): qty 500

## 2018-10-26 MED ORDER — MIRABEGRON ER 50 MG PO TB24: 50.0000 mg | ORAL_TABLET | Freq: Every day | ORAL | Status: DC

## 2018-10-26 MED ORDER — INSULIN ASPART 100 UNIT/ML ~~LOC~~ SOLN
0.0000 [IU] | Freq: Three times a day (TID) | SUBCUTANEOUS | Status: DC
  Administered 2018-10-26: 5 [IU] via SUBCUTANEOUS
  Administered 2018-10-27: 7 [IU] via SUBCUTANEOUS

## 2018-10-26 MED ORDER — AMLODIPINE BESYLATE 5 MG PO TABS
5.0000 mg | ORAL_TABLET | Freq: Every morning | ORAL | Status: DC
  Administered 2018-10-26 – 2018-10-31 (×6): 5 mg via ORAL
  Filled 2018-10-26 (×6): qty 1

## 2018-10-26 MED ORDER — GABAPENTIN 300 MG PO CAPS
600.0000 mg | ORAL_CAPSULE | Freq: Every day | ORAL | Status: DC
  Administered 2018-10-26 – 2018-10-31 (×6): 600 mg via ORAL
  Filled 2018-10-26 (×6): qty 2

## 2018-10-26 MED ORDER — TORSEMIDE 20 MG PO TABS
20.0000 mg | ORAL_TABLET | Freq: Every day | ORAL | Status: DC
  Administered 2018-10-26 – 2018-10-31 (×6): 20 mg via ORAL
  Filled 2018-10-26 (×9): qty 1

## 2018-10-26 MED ORDER — GLIPIZIDE 5 MG PO TABS
5.0000 mg | ORAL_TABLET | Freq: Every day | ORAL | Status: DC
  Filled 2018-10-26 (×2): qty 1

## 2018-10-26 MED ORDER — ORAL CARE MOUTH RINSE
15.0000 mL | Freq: Two times a day (BID) | OROMUCOSAL | Status: DC
  Administered 2018-10-27 – 2018-10-31 (×9): 15 mL via OROMUCOSAL

## 2018-10-26 MED ORDER — SACCHAROMYCES BOULARDII 250 MG PO CAPS
250.0000 mg | ORAL_CAPSULE | Freq: Every day | ORAL | Status: DC
  Administered 2018-10-27 – 2018-10-31 (×5): 250 mg via ORAL
  Filled 2018-10-26 (×7): qty 1

## 2018-10-26 MED ORDER — SODIUM CHLORIDE 0.9% FLUSH: 3.0000 mL | INTRAVENOUS | Status: DC | PRN

## 2018-10-26 NOTE — ED Provider Notes (Signed)
[facility] EMERGENCY DEPARTMENT Provider Note   CSN: [id number] Arrival date & time: 10/26/18  0435   Time seen 6:22 AM  History   Chief Complaint Chief Complaint  Patient presents with  . Urinary Tract Infection   Level 5 caveat for some confusion  HPI [name] is a 85 y.o. female.     HPI patient is here for her daughter.  She states the patient seemed fine all day today.  She did not notice anything unusual.  She states her mother has some mild forgetfulness which was not worse today.  She states about 11:30 PM last night she went to bed in the recliner and she was complaining of having some chills and having some jerking movements.  About 330 this morning her daughter got up to use the bathroom and noted that her mother's flashlight was on and when she went to check on her she was still on her recliner.  She was very disoriented and she asked her daughter if she was going to go home soon (patient lives of her daughter) and was asking about the dog which had actually died 5 years ago.  She was extremely weak and had difficulty trying to get up or help her daughter get her up out of the recliner.  She also could not figure out how to grab hold of her walker to stabilize her self after her daughter got her up.  Daughter states she had a bad episode of jerking last week and she has discussed this with Dr. Luan Pulling who told her she had "Elder jerks".  They deny cough, sore throat, rhinorrhea, nausea, vomiting, diarrhea, dysuria, or frequency.  She had eaten fine today.  She had complained of some pain in her right lower chest/right upper quadrant and she had a tele-consult with her primary care on the 25th.  She was prescribed a muscle relaxer that starts with a "M".  Daughter makes a statement that she can no longer have any oxycodone, she takes gabapentin for chronic pain.  Patient is status post cholecystectomy.  PCP Sinda Du, MD   Past Medical History:  Diagnosis Date  .  Abrasion of arm, left    secondary to fall   . Anemia   . Arthritis   . Breast cancer, left ([place])    er/pr +  . Cataract    no surgery as yet,starting of 3 years  . Chronic kidney disease    renal stones, one episode of Lithotripsy  . Cirrhosis (HCC)    Metavir score 4  . Complication of anesthesia   . Diabetes mellitus   . GERD (gastroesophageal reflux disease)   . Hyperlipidemia   . Hypertension    followed by  grp. relative to  urology study grp.  Doesn't report ever having a stress test   . Mental disorder   . Multiple falls   . Nocturia   . PONV (postoperative nausea and vomiting)   . Seasonal allergies   . Skin cancer    face- melanoma, 2012- surg. excision      Patient Active Problem List   Diagnosis Date Noted  . Acute hypoxemic respiratory failure ([place]) 04/15/2018  . Acute on chronic diastolic CHF (congestive heart failure) ([place]) 04/15/2018  . Malignant neoplasm of overlapping sites of left breast in female, estrogen receptor positive ([place]) 04/03/2018  . Recurrent breast adenocarcinoma, left ([place]) 04/03/2018  . Altered mental status 08/18/2017  . Acute encephalopathy 08/17/2017  . Encephalopathy acute 08/17/2017  .  Encephalopathy 07/25/2017  . Diabetes mellitus ([place]) 09/02/2015  . Anemia due to multiple mechanisms 02/17/2015  . Chronic LBP 02/17/2015  . Impaired renal function 02/17/2015  . Abducens nerve weakness 02/17/2015  . S/P knee replacement 12/18/2014  . Osteoarthritis of right knee 12/18/2014  . IDA (iron deficiency anemia) 10/29/2013  . Vitamin B12 deficiency anemia 09/09/2013  . Anemia, iron deficiency 09/09/2013  . Lumbago 08/06/2013  . Difficulty in walking(719.7) 08/06/2013  . Osteopenia 03/11/2013  . Malignant neoplasm of lower-inner quadrant of left breast in female, estrogen receptor positive ([place]) 03/10/2013  . Bilateral leg weakness 09/23/2012  . Acute renal failure ([place]) 07/25/2012  . Dehydration 07/25/2012  . DM type 2,  uncontrolled, with neuropathy ([place]) 07/25/2012  . DM (diabetes mellitus), type 2, uncontrolled ([place]) 07/25/2012  . Essential hypertension, benign 07/25/2012  . Generalized weakness 07/25/2012  . Hyperlipidemia   . Chronic kidney disease   . Hypertension   . GERD (gastroesophageal reflux disease)   . CAP (community acquired pneumonia) 04/19/2011  . Nausea and vomiting 04/19/2011  . HTN (hypertension) 04/19/2011  . DM type 2 (diabetes mellitus, type 2) ([place]) 04/19/2011  . Arthritis 04/19/2011    Past Surgical History:  Procedure Laterality Date  . ABDOMINAL HYSTERECTOMY    . BACK SURGERY     x4 back surgery to include rods & screws  . Last in May 2014  . BREAST LUMPECTOMY WITH NEEDLE LOCALIZATION AND AXILLARY SENTINEL LYMPH NODE BX  03/12/2012   Procedure: BREAST LUMPECTOMY WITH NEEDLE LOCALIZATION AND AXILLARY SENTINEL LYMPH NODE BX;  Surgeon: Merrie Roof, MD;  Location: [facility];  Service: General;  Laterality: Left;  . BREAST SURGERY     lumpectomy x2/left   . CHOLECYSTECTOMY    . COLONOSCOPY N/A 11/20/2013   Dr.Rourk- diverticula was found in the left colon. o/w normal rectal, colonic and terminal ileal mucosa  . ESOPHAGOGASTRODUODENOSCOPY N/A 11/20/2013   Dr.Rourk- abnormal mucosa was found. diffuse snake skinning and friability of the gastric mucosa. patent pylorus. abnormal-appearing first, second and third portion of the duodenum. 10mm gastric nodule at the GE junction. stomach bx= minimal chronic infammation, GE junction bx= polypoid fragments of gastric cardia type mucosa w/mod chronic inflammation and foveolar hyperplasia  . EYE SURGERY     cataract surgery bilat   . FRACTURE SURGERY     1975- ORIF- L ankle   . GIVENS CAPSULE STUDY N/A 02/24/2014   Procedure: GIVENS CAPSULE STUDY;  Surgeon: Daneil Dolin, MD;  [facility];  Service: Endoscopy;  Laterality: N/A;  . RE-EXCISION OF BREAST LUMPECTOMY  03/22/2012   Procedure: RE-EXCISION OF BREAST LUMPECTOMY;   Surgeon: Merrie Roof, MD;  Location: [facility];  Service: General;  Laterality: Left;  re-excision left breast lumpectomy cavity  ER+, PR+  . ROTATOR CUFF REPAIR Bilateral   . SHOULDER SURGERY    . SKIN BIOPSY    . TOTAL KNEE ARTHROPLASTY Right 12/18/2014   Procedure: RIGHT TOTAL KNEE ARTHROPLASTY;  Surgeon: Sydnee Cabal, MD;  Location: [facility];  Service: Orthopedics;  Laterality: Right;  . TUBAL LIGATION       OB History    Gravida  2   Para  2   Term  2   Preterm      AB      Living  2     SAB      TAB      Ectopic      Multiple  Live Births               Home Medications    Prior to Admission medications   Medication Sig Start Date End Date Taking? Authorizing Provider  acyclovir (ZOVIRAX) 400 MG tablet Take 1 tablet (400 mg total) by mouth 2 (two) times daily. 10/08/18   Magrinat, Virgie Dad, MD  ALPHAGAN P 0.1 % SOLN Place 1 drop into both eyes 2 (two) times daily.  08/01/16   [provider]  amLODipine (NORVASC) 5 MG tablet Take 5 mg by mouth every morning.     [provider]  aspirin EC 81 MG tablet Take 81 mg by mouth daily.    [provider]  atorvastatin (LIPITOR) 10 MG tablet Take 10 mg by mouth every evening.     [provider]  Bacillus Coagulans-Inulin (PROBIOTIC FORMULA PO) Take 1 capsule by mouth daily.    [provider]  BD PEN NEEDLE NANO U/F 32G X 4 MM MISC  06/21/18   [provider]  cyanocobalamin (,VITAMIN B-12,) 1000 MCG/ML injection Inject 1,000 mcg into the muscle every 30 (thirty) days.  05/14/17   [provider]  dorzolamide (TRUSOPT) 2 % ophthalmic solution Place 1 drop into both eyes 2 (two) times daily.     [provider]  EPIPEN 2-PAK 0.3 MG/0.3ML SOAJ injection Inject 0.3 mg into the muscle as needed for anaphylaxis.     [provider]  fulvestrant (FASLODEX) 250 MG/5ML injection Inject 500 mg into the muscle every 14  (fourteen) days. One injection each buttock over 1-2 minutes. Warm prior to use.    [provider]  gabapentin (NEURONTIN) 600 MG tablet Take 600 mg by mouth daily.     [provider]  glipiZIDE (GLUCOTROL) 5 MG tablet Take 5 mg by mouth daily before breakfast.  07/31/16   [provider]  guaiFENesin-dextromethorphan (ROBITUSSIN DM) 100-10 MG/5ML syrup Take 5 mLs by mouth every 4 (four) hours as needed for cough. 04/23/18   Sinda Du, MD  hydrALAZINE (APRESOLINE) 25 MG tablet Take 25-50 mg by mouth See admin instructions. Take 50 mg by mouth in the morning and take 25 mg by mouth at bedtime    [provider]  ipratropium-albuterol (DUONEB) 0.5-2.5 (3) MG/3ML SOLN Take 3 mLs by nebulization 3 (three) times daily. 04/23/18   Sinda Du, MD  Lancets (ONETOUCH DELICA PLUS [id number]) [place]  06/18/18   [provider]  latanoprost (XALATAN) 0.005 % ophthalmic solution Place 1 drop into both eyes at bedtime.  01/17/18   [provider]  metoprolol succinate (TOPROL-XL) 100 MG 24 hr tablet Take 100 mg by mouth every morning. Take with or immediately following a meal.    [provider]  mirtazapine (REMERON) 15 MG tablet Take 15 mg by mouth at bedtime.  08/04/15   [provider]  MYRBETRIQ 50 MG TB24 tablet Take 50 mg by mouth daily.  01/15/18   [provider]  omeprazole (PRILOSEC) 20 MG capsule Take 20 mg by mouth daily.    [provider]  [facility] ULTRA test strip  08/23/18   [provider]  Oxycodone HCl 10 MG TABS Take 10 mg by mouth daily.  08/26/15   [provider]  palbociclib (IBRANCE) 75 MG tablet Take 1 tablet (75 mg total) by mouth daily. Take for 21 days on, 7 days off, repeat every 28 days. 10/08/18   Magrinat, Virgie Dad, MD  Polyethyl Glycol-Propyl Glycol (SYSTANE  ULTRA) 0.4-0.3 % SOLN Place 1-2 drops into both eyes daily as needed (for itching eyes).    [provider]   tamoxifen (NOLVADEX) 20 MG tablet Take 1 tablet (20 mg total) by mouth daily. 10/08/18 11/07/18  Magrinat, Virgie Dad, MD  torsemide (DEMADEX) 20 MG tablet Take 1 tablet (20 mg total) by mouth daily. 08/21/17   Sinda Du, MD  TRESIBA FLEXTOUCH 100 UNIT/ML SOPN FlexTouch Pen Inject 0.5 mLs (50 Units total) into the skin daily. 04/23/18   Sinda Du, MD    Family History Family History  Problem Relation Age of Onset  . Stroke Mother        mini-strokes  . Heart disease Father   . Emphysema Father   . Cancer Brother        metastatic, unsure primary  . Colon cancer Neg Hx   . Prostate cancer Neg Hx     Social History Social History   Tobacco Use  . Smoking status: Never Smoker  . Smokeless tobacco: Never Used  Substance Use Topics  . Alcohol use: No  . Drug use: No  Lives with daughter   Allergies   Bee venom, Hydromorphone hcl, and Demerol   Review of Systems Review of Systems  All other systems reviewed and are negative.    Physical Exam Updated Vital Signs BP (!) 147/73   Pulse 95   Temp (!) 101 F (38.3 C) (Oral)   Resp 20   Ht 5\' 2"  (1.575 m)   Wt 91.2 kg   SpO2 94%   BMI 36.76 kg/m   Physical Exam Vitals signs and nursing note reviewed.  Constitutional:      General: She is not in acute distress.    Appearance: Normal appearance. She is well-developed. She is not ill-appearing or toxic-appearing.  HENT:     Head: Normocephalic and atraumatic.     Right Ear: External ear normal.     Left Ear: External ear normal.     Nose: Nose normal. No mucosal edema or rhinorrhea.     Mouth/Throat:     Dentition: No dental abscesses.     Pharynx: No uvula swelling.  Eyes:     Conjunctiva/sclera: Conjunctivae normal.     Pupils: Pupils are equal, round, and reactive to light.  Neck:     Musculoskeletal: Full passive range of motion without pain, normal range of motion and neck supple.  Cardiovascular:     Rate and Rhythm: Normal rate and regular  rhythm.     Heart sounds: Normal heart sounds. No murmur. No friction rub. No gallop.   Pulmonary:     Effort: Pulmonary effort is normal. No respiratory distress.     Breath sounds: Normal breath sounds. No wheezing, rhonchi or rales.  Chest:     Chest wall: No tenderness or crepitus.  Abdominal:     General: Abdomen is flat. Bowel sounds are normal. There is no distension.     Palpations: Abdomen is soft.     Tenderness: There is abdominal tenderness. There is no guarding or rebound.       Comments: It is hard to tell whether her pain is in the abdomen or the lower rib cage, when I give her choices she is unable to choose a choice.  Musculoskeletal: Normal range of motion.        General: No tenderness.     Comments: Moves all extremities well.   Skin:    General: Skin is warm and dry.  Coloration: Skin is not pale.     Findings: No erythema or rash.  Neurological:     Mental Status: She is alert and oriented to person, place, and time.     Cranial Nerves: No cranial nerve deficit.  Psychiatric:        Attention and Perception: Attention and perception normal.        Mood and Affect: Mood and affect normal. Mood is not anxious.        Speech: Speech normal.        Behavior: Behavior normal.     Comments: When you for start talking the patient she seems okay however when you start asking her specific questions there is some confusion there.      ED Treatments / Results  Labs (all labs ordered are listed, but only abnormal results are displayed) Results for orders placed or performed during the hospital encounter of 10/26/18  Urinalysis, Routine w reflex microscopic  Result Value Ref Range   Color, Urine YELLOW YELLOW   APPearance CLEAR CLEAR   Specific Gravity, Urine 1.012 1.005 - 1.030   pH 7.0 5.0 - 8.0   Glucose, UA 150 (A) NEGATIVE mg/dL   Hgb urine dipstick NEGATIVE NEGATIVE   Bilirubin Urine NEGATIVE NEGATIVE   Ketones, ur NEGATIVE NEGATIVE mg/dL   Protein,  ur NEGATIVE NEGATIVE mg/dL   Nitrite NEGATIVE NEGATIVE   Leukocytes,Ua NEGATIVE NEGATIVE   Laboratory interpretation all normal except blood work is pending    EKG None  Radiology No results found.  Procedures Procedures (including critical care time)  Medications Ordered in ED Medications  acetaminophen (TYLENOL) tablet 650 mg (650 mg Oral Given 10/26/18 8329)     Initial Impression / Assessment and Plan / ED Course  I have reviewed the triage vital signs and the nursing notes.  Pertinent labs & imaging results that were available during my care of the patient were reviewed by me and considered in my medical decision making (see chart for details).    Patient's urine was normal, no evidence of a UTI.  Laboratory testing was done.  Patient has a history of cirrhosis, ammonia level was done.  Portable chest x-ray was done and COVID testing was done although she does not have all of the typical COVID symptoms.    Patient was turned over to Dr. Thurnell Garbe at change of shift.  Final Clinical Impressions(s) / ED Diagnoses   Final diagnoses:  Fever, unspecified fever cause    Disposition pending  Rolland Porter, MD, Barbette Or, MD 10/26/18 [phone]

## 2018-10-26 NOTE — ED Notes (Signed)
Pt's O2 sats stayed at 93% while lying in the bed with no O2 on. Pt stood up beside bed and O2 sats dropped to 80%. Pt stated she was not SOB or dizzy.

## 2018-10-26 NOTE — ED Triage Notes (Signed)
Pt was at home in recliner this morning and family had difficulty getting Pt to her feet to get her to restroom. Family states Pt was confused and making comments further adding to their confusion. Family present in the room state she acted like this before with a previous UTI. Pt presents febrile and some right-sided flank pain.

## 2018-10-26 NOTE — ED Provider Notes (Signed)
Pt received at sign out with workup pending. Pt 84yoF, found to have shaking chills and confusion overnight. Pt hypoxic with fever in ED. BP stable. NAD, awake/alert, neck supple, resps easy, abd soft/NT, neuro non-focal. COVID negative. Workup otherwise unremarkable. BC and UC pending. 1110:  T/C returned from [facility] Dr. Denton Brick, case discussed, including:  HPI, pertinent PM/SHx, VS/PE, dx testing, ED course and treatment:  Will speak with pt's PMD before evaluation for admission.    Francine Graven, DO 10/26/18 1117

## 2018-10-26 NOTE — H&P (Signed)
Patient Demographics:    Zenith Kercheval, is a 85 y.o. female  MRN: [id number]   DOB - 03/14/1933  Admit Date - 10/26/2018  Outpatient Primary MD for the patient is Sinda Du, MD   Assessment & Plan:    Principal Problem:   Acute Respiratory failure with Hypoxia ([place]) Active Problems:   Acute Metabolic Encephalopathy acute   Acute hypoxemic respiratory failure (HCC)   Fever   Hypertension   HTN (hypertension)   DM type 2 (diabetes mellitus, type 2) (HCC)   Hyperlipidemia    1)Febrile illness--patient is immunocompromised (on Ibrance and tamoxifen for recurrent left breast cancer)--chest and abdominal imaging studies noted--- treat empirically with IV Rocephin/azithromycin for possible respiratory infection pending cultures  2)Acute hypoxic respiratory failure----??? possible respiratory infection, no history of heart failure last known EF 55 to 60%-- -bronchodilators as ordered, treat as above #1, give supplemental oxygen -May need home O2  3)Recurrent left breast cancer--patient sees Dr. Jana Hakim, continue [name] and tamoxifen -Use acyclovir prophylactically  4)DM2--uncontrolled, prior A1c 9.1,, continue Lantus 50 units daily, stop glipizide, Use Novolog/Humalog Sliding scale insulin with Accu-Cheks/Fingersticks as ordered   5)HTN--give amlodipine 5 mg daily, Toprol-XL 100 mg daily,   may use IV Hydralazine 10 mg  Every 4 hours Prn for systolic blood pressure over 160 mmhg  6) acute metabolic encephalopathy--- most likely secondary to #1 and #2 above, mental status appears to be improving, treat as above #1 and #2 -Ammonia is not elevated  7) generalized weakness/deconditioning--- get physical therapy eval  8)CKD IV--creatinine is currently 2.3 which is close to her baseline, - renally adjust  medications, avoid nephrotoxic agents / dehydration /hypotension  9)Social/Ethics--plan of care and CODE STATUS discussed with patient and daughter Ms. Eben Burow, patient is a DNR/DNI   With History of - Reviewed by me  Past Medical History:  Diagnosis Date   Abrasion of arm, left    secondary to fall    Anemia    Arthritis    Breast cancer, left ([facility] [facility])    er/pr +   Cataract    no surgery as yet,starting of 3 years   Chronic kidney disease    renal stones, one episode of Lithotripsy   Cirrhosis ([place])    Metavir score 4   Complication of anesthesia    Diabetes mellitus    GERD (gastroesophageal reflux disease)    Hyperlipidemia    Hypertension    followed by [facility] relative to  urology study grp.  Doesn't report ever having a stress test    Mental disorder    Multiple falls    Nocturia    PONV (postoperative nausea and vomiting)    Seasonal allergies    Skin cancer    face- melanoma, 2012- surg. excision        Past Surgical History:  Procedure Laterality Date   ABDOMINAL HYSTERECTOMY     BACK SURGERY     x4 back surgery  to include rods & screws  . Last in May 2014   BREAST LUMPECTOMY WITH NEEDLE LOCALIZATION AND AXILLARY SENTINEL LYMPH NODE BX  03/12/2012   Procedure: BREAST LUMPECTOMY WITH NEEDLE LOCALIZATION AND AXILLARY SENTINEL LYMPH NODE BX;  Surgeon: Merrie Roof, MD;  Location: [facility];  Service: General;  Laterality: Left;   BREAST SURGERY     lumpectomy x2/left    CHOLECYSTECTOMY     COLONOSCOPY N/A 11/20/2013   Dr.Rourk- diverticula was found in the left colon. o/w normal rectal, colonic and terminal ileal mucosa   ESOPHAGOGASTRODUODENOSCOPY N/A 11/20/2013   Dr.Rourk- abnormal mucosa was found. diffuse snake skinning and friability of the gastric mucosa. patent pylorus. abnormal-appearing first, second and third portion of the duodenum. 21mm gastric nodule at the GE junction. stomach bx= minimal chronic infammation, GE  junction bx= polypoid fragments of gastric cardia type mucosa w/mod chronic inflammation and foveolar hyperplasia   EYE SURGERY     cataract surgery bilat    FRACTURE SURGERY     1975- [facility]- L ankle    GIVENS CAPSULE STUDY N/A 02/24/2014   Procedure: GIVENS CAPSULE STUDY;  Surgeon: Daneil Dolin, MD;  Location: [facility];  Service: Endoscopy;  Laterality: N/A;   RE-EXCISION OF BREAST LUMPECTOMY  03/22/2012   Procedure: RE-EXCISION OF BREAST LUMPECTOMY;  Surgeon: Merrie Roof, MD;  Location: [facility];  Service: General;  Laterality: Left;  re-excision left breast lumpectomy cavity  ER+, PR+   ROTATOR CUFF REPAIR Bilateral    SHOULDER SURGERY     SKIN BIOPSY     TOTAL KNEE ARTHROPLASTY Right 12/18/2014   Procedure: RIGHT TOTAL KNEE ARTHROPLASTY;  Surgeon: Sydnee Cabal, MD;  Location: [facility];  Service: Orthopedics;  Laterality: Right;   TUBAL LIGATION      Chief Complaint  Patient presents with   Urinary Tract Infection      HPI:    Barbaraann Avans  is a 85 y.o. female with recurrent left breast invasive ductal carcinoma,  status post left lumpectomy, h/o DM2, HLD, HTN who presented to the ED with concerns about fevers up to 101, confusion and disorientation since about 3 AM --Additional history obtained from patient's daughter apparently no vomiting no diarrhea no falls no productive cough -No sick contacts at home -No sore throat or URI type symptoms -Denies dysuria or flank pain -No chest pain palpitations or dizziness  --In ED fever is confirmed at 101,  -CT chest showed small faint groundglass densities in both upper lobes which may be infectious versus inflammatory however no consolidation noted -CT abdomen and pelvis without acute findings -WBC is 4.3, lactic acid is not elevated, UA is not suggestive of UTI -COVID-19 negative -Creatinine is 2.3 which is slightly above a baseline Ammonia is 29, not elevated -Lipase is 19    Review of  systems:    In addition to the HPI above,   A full Review of  Systems was done, all other systems reviewed are negative except as noted above in HPI , .    Social History:  Reviewed by me    Social History   Tobacco Use   Smoking status: Never Smoker   Smokeless tobacco: Never Used  Substance Use Topics   Alcohol use: No       Family History :  Reviewed by me    Family History  Problem Relation Age of Onset   Stroke Mother        mini-strokes  Heart disease Father    Emphysema Father    Cancer Brother        metastatic, unsure primary   Colon cancer Neg Hx    Prostate cancer Neg Hx      Home Medications:   Prior to Admission medications   Medication Sig Start Date End Date Taking? Authorizing Provider  acyclovir (ZOVIRAX) 400 MG tablet Take 1 tablet (400 mg total) by mouth 2 (two) times daily. 10/08/18  Yes Magrinat, Virgie Dad, MD  ALPHAGAN P 0.1 % SOLN Place 1 drop into both eyes 2 (two) times daily.  08/01/16  Yes [provider]  amLODipine (NORVASC) 5 MG tablet Take 5 mg by mouth every morning.    Yes [provider]  aspirin EC 81 MG tablet Take 81 mg by mouth daily.   Yes [provider]  atorvastatin (LIPITOR) 10 MG tablet Take 10 mg by mouth every evening.    Yes [provider]  Bacillus Coagulans-Inulin (PROBIOTIC FORMULA PO) Take 1 capsule by mouth daily.   Yes [provider]  BD PEN NEEDLE NANO U/F 32G X 4 MM MISC  06/21/18  Yes [provider]  cyanocobalamin (,VITAMIN B-12,) 1000 MCG/ML injection Inject 1,000 mcg into the muscle every 30 (thirty) days.  05/14/17  Yes [provider]  dorzolamide (TRUSOPT) 2 % ophthalmic solution Place 1 drop into both eyes 2 (two) times daily.    Yes [provider]  EPIPEN 2-PAK 0.3 MG/0.3ML SOAJ injection Inject 0.3 mg into the muscle as needed for anaphylaxis.    Yes [provider]  fulvestrant (FASLODEX) 250 MG/5ML injection  Inject 500 mg into the muscle every 14 (fourteen) days. One injection each buttock over 1-2 minutes. Warm prior to use.   Yes [provider]  gabapentin (NEURONTIN) 600 MG tablet Take 600 mg by mouth daily.    Yes [provider]  glipiZIDE (GLUCOTROL) 5 MG tablet Take 5 mg by mouth daily before breakfast.  07/31/16  Yes [provider]  guaiFENesin-dextromethorphan (ROBITUSSIN DM) 100-10 MG/5ML syrup Take 5 mLs by mouth every 4 (four) hours as needed for cough. 04/23/18  Yes Sinda Du, MD  ipratropium-albuterol (DUONEB) 0.5-2.5 (3) MG/3ML SOLN Take 3 mLs by nebulization 3 (three) times daily. 04/23/18  Yes Sinda Du, MD  Lancets (ONETOUCH DELICA PLUS OACZYS06T) [place]  06/18/18  Yes [provider]  latanoprost (XALATAN) 0.005 % ophthalmic solution Place 1 drop into both eyes at bedtime.  01/17/18  Yes [provider]  methocarbamol (ROBAXIN) 500 MG tablet Take 500 mg by mouth 2 (two) times daily as needed for muscle spasms.   Yes [provider]  metoprolol succinate (TOPROL-XL) 100 MG 24 hr tablet Take 100 mg by mouth every morning. Take with or immediately following a meal.   Yes [provider]  mirtazapine (REMERON) 15 MG tablet Take 15 mg by mouth at bedtime.  08/04/15  Yes [provider]  MYRBETRIQ 50 MG TB24 tablet Take 50 mg by mouth daily.  01/15/18  Yes [provider]  omeprazole (PRILOSEC) 20 MG capsule Take 20 mg by mouth daily.   Yes [provider]  Donald Siva test strip  08/23/18  Yes [provider]  palbociclib (IBRANCE) 75 MG tablet Take 1 tablet (75 mg total) by mouth daily. Take for 21 days on, 7 days off, repeat every 28 days. 10/08/18  Yes Magrinat, Virgie Dad, MD  Polyethyl Glycol-Propyl Glycol (SYSTANE ULTRA) 0.4-0.3 % SOLN Place  1-2 drops into both eyes daily as needed (for itching eyes).   Yes [provider]  tamoxifen (NOLVADEX) 20 MG tablet Take 1 tablet (20  mg total) by mouth daily. 10/08/18 11/07/18 Yes Magrinat, Virgie Dad, MD  torsemide (DEMADEX) 20 MG tablet Take 1 tablet (20 mg total) by mouth daily. 08/21/17  Yes Sinda Du, MD  TRESIBA FLEXTOUCH 100 UNIT/ML SOPN FlexTouch Pen Inject 0.5 mLs (50 Units total) into the skin daily. 04/23/18  Yes Sinda Du, MD     Allergies:     Allergies  Allergen Reactions   Bee Venom Anaphylaxis and Swelling   Hydromorphone Hcl Other (See Comments)    Agitation, Confusion   Demerol Nausea Only     Physical Exam:   Vitals  Blood pressure 140/61, pulse 77, temperature 99.2 F (37.3 C), temperature source Oral, resp. rate 20, height 5' (1.524 m), weight 91.9 kg, SpO2 96 %.  Physical Examination: General appearance - alert,, and in no distress Mental status - alert, oriented to person, place, and time,  Eyes - sclera anicteric  Nose- [place] 2L/min Breast- Lt breast-left breast skin thickening  Neck - supple, no JVD elevation , Chest -diminished in bases, no wheezing  heart - S1 and S2 normal, regular  Abdomen - soft, nontender, nondistended, no masses or organomegaly Neurological -generalized weakness without new focal deficits, somewhat forgetful, mostly oriented at this time apparently was disoriented earlier extremities - trace pedal edema noted, intact peripheral pulses  Skin - warm, dry     Data Review:    CBC Recent Labs  Lab 10/26/18 0748  WBC 4.3  HGB 11.1*  HCT 32.9*  PLT 101*  MCV 116.3*  MCH 39.2*  MCHC 33.7  RDW 15.2  LYMPHSABS 0.9  MONOABS 0.3  EOSABS 0.0  BASOSABS 0.0   ------------------------------------------------------------------------------------------------------------------  Chemistries  Recent Labs  Lab 10/26/18 0748  NA 137  K 3.9  CL 99  CO2 27  GLUCOSE 270*  BUN 32*  CREATININE 2.34*  CALCIUM 8.7*  AST 52*  ALT 23  ALKPHOS 74  BILITOT 1.0    ------------------------------------------------------------------------------------------------------------------ estimated creatinine clearance is 18.8 mL/min (A) (by C-G formula based on SCr of 2.34 mg/dL (H)). ------------------------------------------------------------------------------------------------------------------ No results for input(s): TSH, T4TOTAL, T3FREE, THYROIDAB in the last 72 hours.  Invalid input(s): FREET3   Coagulation profile No results for input(s): INR, PROTIME in the last 168 hours. ------------------------------------------------------------------------------------------------------------------- No results for input(s): DDIMER in the last 72 hours. -------------------------------------------------------------------------------------------------------------------  Cardiac Enzymes No results for input(s): CKMB, TROPONINI, MYOGLOBIN in the last 168 hours.  Invalid input(s): CK ------------------------------------------------------------------------------------------------------------------    Component Value Date/Time   BNP 49.0 08/17/2017 2015     ---------------------------------------------------------------------------------------------------------------  Urinalysis    Component Value Date/Time   COLORURINE YELLOW 10/26/2018 0532   APPEARANCEUR CLEAR 10/26/2018 0532   LABSPEC 1.012 10/26/2018 0532   PHURINE 7.0 10/26/2018 0532   GLUCOSEU 150 (A) 10/26/2018 0532   HGBUR NEGATIVE 10/26/2018 0532   BILIRUBINUR NEGATIVE 10/26/2018 0532   KETONESUR NEGATIVE 10/26/2018 0532   PROTEINUR NEGATIVE 10/26/2018 0532   UROBILINOGEN 1.0 12/11/2014 1106   NITRITE NEGATIVE 10/26/2018 0532   LEUKOCYTESUR NEGATIVE 10/26/2018 0532    ----------------------------------------------------------------------------------------------------------------   Imaging Results:    Ct Abdomen Pelvis Wo Contrast  Result Date: 10/26/2018 CLINICAL DATA:  Fever.  Right-sided flank pain. Altered mental status. History of left-sided breast cancer. EXAM: CT CHEST, ABDOMEN AND PELVIS WITHOUT CONTRAST TECHNIQUE: Multidetector CT imaging of the chest, abdomen and pelvis was performed following the  standard protocol without IV contrast. COMPARISON:  CT chest dated April 11, 2018. Abdominal ultrasound dated June 09, 2014. CT abdomen pelvis dated April 10, 2011. FINDINGS: CT CHEST FINDINGS Cardiovascular: Normal heart size. No pericardial effusion. No thoracic aortic aneurysm. Coronary, aortic arch, and branch vessel atherosclerotic vascular disease. Mediastinum/Nodes: No enlarged mediastinal, hilar, or axillary lymph nodes. Thyroid gland, trachea, and esophagus demonstrate no significant findings. Lungs/Pleura: There are few scattered small faint ground-glass densities in the right upper lobe (series 3, image 39) and left upper lobe (series 3, image 59). Scattered subsegmental atelectasis and scarring. No focal consolidation, pleural effusion, or pneumothorax. Musculoskeletal: Unchanged left breast skin thickening and retroareolar soft tissue fullness. No acute or significant osseous findings. Chronic T12 compression deformity. CT ABDOMEN PELVIS FINDINGS Hepatobiliary: Unchanged cirrhosis. No focal liver abnormality. Prior cholecystectomy. No biliary dilatation. Pancreas: Unremarkable. No pancreatic ductal dilatation or surrounding inflammatory changes. Spleen: Unchanged splenomegaly and 1.7 cm low-density lesion in the anterior spleen. Adrenals/Urinary Tract: The adrenal glands are unremarkable. Unchanged left renal atrophy. No renal calculi or hydronephrosis. The bladder is unremarkable. Stomach/Bowel: Stomach is within normal limits. Appendix appears normal. No evidence of bowel wall thickening, distention, or inflammatory changes. Moderate left-sided colonic diverticulosis. Vascular/Lymphatic: Minimal aortic atherosclerosis. No enlarged abdominal or pelvic lymph nodes.  Reproductive: Status post hysterectomy. No adnexal masses. Other: No abdominal wall hernia or abnormality. Trace perihepatic ascites. No pneumoperitoneum. Musculoskeletal: No acute or significant osseous findings. Prior lumbar decompression and fusion. Chronic L1 and L4 compression deformities. Progressive severe spondylosis at T12-L1. IMPRESSION: Chest: 1. A few scattered small faint ground-glass densities in both upper lobes may be infectious or inflammatory. No consolidative pneumonia. 2. Unchanged left breast skin thickening and retro soft tissue fullness, nonspecific in the setting of prior radiation. 3.  Aortic atherosclerosis (ICD10-I70.0). Abdomen and pelvis: 1.  No acute intra-abdominal process. 2. Unchanged cirrhosis and splenomegaly. Electronically Signed   By: Titus Dubin M.D.   On: 10/26/2018 10:34   Ct Chest Wo Contrast  Result Date: 10/26/2018 CLINICAL DATA:  Fever. Right-sided flank pain. Altered mental status. History of left-sided breast cancer. EXAM: CT CHEST, ABDOMEN AND PELVIS WITHOUT CONTRAST TECHNIQUE: Multidetector CT imaging of the chest, abdomen and pelvis was performed following the standard protocol without IV contrast. COMPARISON:  CT chest dated April 11, 2018. Abdominal ultrasound dated June 09, 2014. CT abdomen pelvis dated April 10, 2011. FINDINGS: CT CHEST FINDINGS Cardiovascular: Normal heart size. No pericardial effusion. No thoracic aortic aneurysm. Coronary, aortic arch, and branch vessel atherosclerotic vascular disease. Mediastinum/Nodes: No enlarged mediastinal, hilar, or axillary lymph nodes. Thyroid gland, trachea, and esophagus demonstrate no significant findings. Lungs/Pleura: There are few scattered small faint ground-glass densities in the right upper lobe (series 3, image 39) and left upper lobe (series 3, image 59). Scattered subsegmental atelectasis and scarring. No focal consolidation, pleural effusion, or pneumothorax. Musculoskeletal: Unchanged left  breast skin thickening and retroareolar soft tissue fullness. No acute or significant osseous findings. Chronic T12 compression deformity. CT ABDOMEN PELVIS FINDINGS Hepatobiliary: Unchanged cirrhosis. No focal liver abnormality. Prior cholecystectomy. No biliary dilatation. Pancreas: Unremarkable. No pancreatic ductal dilatation or surrounding inflammatory changes. Spleen: Unchanged splenomegaly and 1.7 cm low-density lesion in the anterior spleen. Adrenals/Urinary Tract: The adrenal glands are unremarkable. Unchanged left renal atrophy. No renal calculi or hydronephrosis. The bladder is unremarkable. Stomach/Bowel: Stomach is within normal limits. Appendix appears normal. No evidence of bowel wall thickening, distention, or inflammatory changes. Moderate left-sided colonic diverticulosis. Vascular/Lymphatic: Minimal aortic atherosclerosis. No enlarged abdominal or pelvic  lymph nodes. Reproductive: Status post hysterectomy. No adnexal masses. Other: No abdominal wall hernia or abnormality. Trace perihepatic ascites. No pneumoperitoneum. Musculoskeletal: No acute or significant osseous findings. Prior lumbar decompression and fusion. Chronic L1 and L4 compression deformities. Progressive severe spondylosis at T12-L1. IMPRESSION: Chest: 1. A few scattered small faint ground-glass densities in both upper lobes may be infectious or inflammatory. No consolidative pneumonia. 2. Unchanged left breast skin thickening and retro soft tissue fullness, nonspecific in the setting of prior radiation. 3.  Aortic atherosclerosis (ICD10-I70.0). Abdomen and pelvis: 1.  No acute intra-abdominal process. 2. Unchanged cirrhosis and splenomegaly. Electronically Signed   By: Titus Dubin M.D.   On: 10/26/2018 10:34   Dg Chest Port 1 View  Result Date: 10/26/2018 CLINICAL DATA:  Weakness and confusion. EXAM: PORTABLE CHEST 1 VIEW COMPARISON:  04/15/2018 FINDINGS: Chronic cardiomegaly with left ventricular prominence. Chronic aortic  atherosclerosis. Chronic abnormal lung markings. Slight increase in patchy density at the lung bases could be atelectasis or mild basilar pneumonia. No visible effusion. IMPRESSION: Chronic cardiomegaly and lung markings. Slightly more prominent markings at the lung bases than seen previously. This could relate to mild atelectasis or mild basilar pneumonia. Electronically Signed   By: Nelson Chimes M.D.   On: 10/26/2018 07:37    Radiological Exams on Admission: Ct Abdomen Pelvis Wo Contrast  Result Date: 10/26/2018 CLINICAL DATA:  Fever. Right-sided flank pain. Altered mental status. History of left-sided breast cancer. EXAM: CT CHEST, ABDOMEN AND PELVIS WITHOUT CONTRAST TECHNIQUE: Multidetector CT imaging of the chest, abdomen and pelvis was performed following the standard protocol without IV contrast. COMPARISON:  CT chest dated April 11, 2018. Abdominal ultrasound dated June 09, 2014. CT abdomen pelvis dated April 10, 2011. FINDINGS: CT CHEST FINDINGS Cardiovascular: Normal heart size. No pericardial effusion. No thoracic aortic aneurysm. Coronary, aortic arch, and branch vessel atherosclerotic vascular disease. Mediastinum/Nodes: No enlarged mediastinal, hilar, or axillary lymph nodes. Thyroid gland, trachea, and esophagus demonstrate no significant findings. Lungs/Pleura: There are few scattered small faint ground-glass densities in the right upper lobe (series 3, image 39) and left upper lobe (series 3, image 59). Scattered subsegmental atelectasis and scarring. No focal consolidation, pleural effusion, or pneumothorax. Musculoskeletal: Unchanged left breast skin thickening and retroareolar soft tissue fullness. No acute or significant osseous findings. Chronic T12 compression deformity. CT ABDOMEN PELVIS FINDINGS Hepatobiliary: Unchanged cirrhosis. No focal liver abnormality. Prior cholecystectomy. No biliary dilatation. Pancreas: Unremarkable. No pancreatic ductal dilatation or surrounding  inflammatory changes. Spleen: Unchanged splenomegaly and 1.7 cm low-density lesion in the anterior spleen. Adrenals/Urinary Tract: The adrenal glands are unremarkable. Unchanged left renal atrophy. No renal calculi or hydronephrosis. The bladder is unremarkable. Stomach/Bowel: Stomach is within normal limits. Appendix appears normal. No evidence of bowel wall thickening, distention, or inflammatory changes. Moderate left-sided colonic diverticulosis. Vascular/Lymphatic: Minimal aortic atherosclerosis. No enlarged abdominal or pelvic lymph nodes. Reproductive: Status post hysterectomy. No adnexal masses. Other: No abdominal wall hernia or abnormality. Trace perihepatic ascites. No pneumoperitoneum. Musculoskeletal: No acute or significant osseous findings. Prior lumbar decompression and fusion. Chronic L1 and L4 compression deformities. Progressive severe spondylosis at T12-L1. IMPRESSION: Chest: 1. A few scattered small faint ground-glass densities in both upper lobes may be infectious or inflammatory. No consolidative pneumonia. 2. Unchanged left breast skin thickening and retro soft tissue fullness, nonspecific in the setting of prior radiation. 3.  Aortic atherosclerosis (ICD10-I70.0). Abdomen and pelvis: 1.  No acute intra-abdominal process. 2. Unchanged cirrhosis and splenomegaly. Electronically Signed   By: Orville Govern.D.  On: 10/26/2018 10:34   Ct Chest Wo Contrast  Result Date: 10/26/2018 CLINICAL DATA:  Fever. Right-sided flank pain. Altered mental status. History of left-sided breast cancer. EXAM: CT CHEST, ABDOMEN AND PELVIS WITHOUT CONTRAST TECHNIQUE: Multidetector CT imaging of the chest, abdomen and pelvis was performed following the standard protocol without IV contrast. COMPARISON:  CT chest dated April 11, 2018. Abdominal ultrasound dated June 09, 2014. CT abdomen pelvis dated April 10, 2011. FINDINGS: CT CHEST FINDINGS Cardiovascular: Normal heart size. No pericardial effusion. No  thoracic aortic aneurysm. Coronary, aortic arch, and branch vessel atherosclerotic vascular disease. Mediastinum/Nodes: No enlarged mediastinal, hilar, or axillary lymph nodes. Thyroid gland, trachea, and esophagus demonstrate no significant findings. Lungs/Pleura: There are few scattered small faint ground-glass densities in the right upper lobe (series 3, image 39) and left upper lobe (series 3, image 59). Scattered subsegmental atelectasis and scarring. No focal consolidation, pleural effusion, or pneumothorax. Musculoskeletal: Unchanged left breast skin thickening and retroareolar soft tissue fullness. No acute or significant osseous findings. Chronic T12 compression deformity. CT ABDOMEN PELVIS FINDINGS Hepatobiliary: Unchanged cirrhosis. No focal liver abnormality. Prior cholecystectomy. No biliary dilatation. Pancreas: Unremarkable. No pancreatic ductal dilatation or surrounding inflammatory changes. Spleen: Unchanged splenomegaly and 1.7 cm low-density lesion in the anterior spleen. Adrenals/Urinary Tract: The adrenal glands are unremarkable. Unchanged left renal atrophy. No renal calculi or hydronephrosis. The bladder is unremarkable. Stomach/Bowel: Stomach is within normal limits. Appendix appears normal. No evidence of bowel wall thickening, distention, or inflammatory changes. Moderate left-sided colonic diverticulosis. Vascular/Lymphatic: Minimal aortic atherosclerosis. No enlarged abdominal or pelvic lymph nodes. Reproductive: Status post hysterectomy. No adnexal masses. Other: No abdominal wall hernia or abnormality. Trace perihepatic ascites. No pneumoperitoneum. Musculoskeletal: No acute or significant osseous findings. Prior lumbar decompression and fusion. Chronic L1 and L4 compression deformities. Progressive severe spondylosis at T12-L1. IMPRESSION: Chest: 1. A few scattered small faint ground-glass densities in both upper lobes may be infectious or inflammatory. No consolidative pneumonia. 2.  Unchanged left breast skin thickening and retro soft tissue fullness, nonspecific in the setting of prior radiation. 3.  Aortic atherosclerosis (ICD10-I70.0). Abdomen and pelvis: 1.  No acute intra-abdominal process. 2. Unchanged cirrhosis and splenomegaly. Electronically Signed   By: Titus Dubin M.D.   On: 10/26/2018 10:34   Dg Chest Port 1 View  Result Date: 10/26/2018 CLINICAL DATA:  Weakness and confusion. EXAM: PORTABLE CHEST 1 VIEW COMPARISON:  04/15/2018 FINDINGS: Chronic cardiomegaly with left ventricular prominence. Chronic aortic atherosclerosis. Chronic abnormal lung markings. Slight increase in patchy density at the lung bases could be atelectasis or mild basilar pneumonia. No visible effusion. IMPRESSION: Chronic cardiomegaly and lung markings. Slightly more prominent markings at the lung bases than seen previously. This could relate to mild atelectasis or mild basilar pneumonia. Electronically Signed   By: Nelson Chimes M.D.   On: 10/26/2018 07:37    DVT Prophylaxis -SCD /heparin AM Labs Ordered, also please review Full Orders  Family Communication: Admission, patients condition and plan of care including tests being ordered have been discussed with the patient and daughter who indicate understanding and agree with the plan   Code Status - Full Code  Likely DC to  home  Condition   stable  Roxan Hockey M.D on 10/26/2018 at 4:50 PM Go to [email or web address] -  for contact info  [facility] - Office  [phone]

## 2018-10-27 DIAGNOSIS — Z923 Personal history of irradiation: Secondary | ICD-10-CM | POA: Diagnosis not present

## 2018-10-27 DIAGNOSIS — M545 Low back pain: Secondary | ICD-10-CM | POA: Diagnosis not present

## 2018-10-27 DIAGNOSIS — Z90710 Acquired absence of both cervix and uterus: Secondary | ICD-10-CM | POA: Diagnosis not present

## 2018-10-27 DIAGNOSIS — K7460 Unspecified cirrhosis of liver: Secondary | ICD-10-CM | POA: Diagnosis not present

## 2018-10-27 DIAGNOSIS — I129 Hypertensive chronic kidney disease with stage 1 through stage 4 chronic kidney disease, or unspecified chronic kidney disease: Secondary | ICD-10-CM | POA: Diagnosis not present

## 2018-10-27 DIAGNOSIS — Z853 Personal history of malignant neoplasm of breast: Secondary | ICD-10-CM | POA: Diagnosis not present

## 2018-10-27 DIAGNOSIS — Z823 Family history of stroke: Secondary | ICD-10-CM | POA: Diagnosis not present

## 2018-10-27 DIAGNOSIS — J42 Unspecified chronic bronchitis: Secondary | ICD-10-CM | POA: Diagnosis present

## 2018-10-27 DIAGNOSIS — E785 Hyperlipidemia, unspecified: Secondary | ICD-10-CM | POA: Diagnosis present

## 2018-10-27 DIAGNOSIS — Z20828 Contact with and (suspected) exposure to other viral communicable diseases: Secondary | ICD-10-CM | POA: Diagnosis not present

## 2018-10-27 DIAGNOSIS — G9341 Metabolic encephalopathy: Secondary | ICD-10-CM | POA: Diagnosis not present

## 2018-10-27 DIAGNOSIS — Z7984 Long term (current) use of oral hypoglycemic drugs: Secondary | ICD-10-CM | POA: Diagnosis not present

## 2018-10-27 DIAGNOSIS — Z96651 Presence of right artificial knee joint: Secondary | ICD-10-CM | POA: Diagnosis present

## 2018-10-27 DIAGNOSIS — Z66 Do not resuscitate: Secondary | ICD-10-CM | POA: Diagnosis not present

## 2018-10-27 DIAGNOSIS — C50912 Malignant neoplasm of unspecified site of left female breast: Secondary | ICD-10-CM | POA: Diagnosis present

## 2018-10-27 DIAGNOSIS — K219 Gastro-esophageal reflux disease without esophagitis: Secondary | ICD-10-CM | POA: Diagnosis present

## 2018-10-27 DIAGNOSIS — J189 Pneumonia, unspecified organism: Secondary | ICD-10-CM | POA: Diagnosis not present

## 2018-10-27 DIAGNOSIS — N390 Urinary tract infection, site not specified: Secondary | ICD-10-CM | POA: Diagnosis not present

## 2018-10-27 DIAGNOSIS — J9601 Acute respiratory failure with hypoxia: Secondary | ICD-10-CM | POA: Diagnosis not present

## 2018-10-27 DIAGNOSIS — Z85820 Personal history of malignant melanoma of skin: Secondary | ICD-10-CM | POA: Diagnosis not present

## 2018-10-27 DIAGNOSIS — N184 Chronic kidney disease, stage 4 (severe): Secondary | ICD-10-CM | POA: Diagnosis not present

## 2018-10-27 DIAGNOSIS — E1122 Type 2 diabetes mellitus with diabetic chronic kidney disease: Secondary | ICD-10-CM | POA: Diagnosis not present

## 2018-10-27 DIAGNOSIS — Z8249 Family history of ischemic heart disease and other diseases of the circulatory system: Secondary | ICD-10-CM | POA: Diagnosis not present

## 2018-10-27 DIAGNOSIS — Z87442 Personal history of urinary calculi: Secondary | ICD-10-CM | POA: Diagnosis not present

## 2018-10-27 DIAGNOSIS — R0902 Hypoxemia: Secondary | ICD-10-CM | POA: Diagnosis present

## 2018-10-27 DIAGNOSIS — Z9049 Acquired absence of other specified parts of digestive tract: Secondary | ICD-10-CM | POA: Diagnosis not present

## 2018-10-27 LAB — URINE CULTURE

## 2018-10-27 LAB — BASIC METABOLIC PANEL
Anion gap: 8 (ref 5–15)
BUN: 33 mg/dL — ABNORMAL HIGH (ref 8–23)
CO2: 29 mmol/L (ref 22–32)
Calcium: 8.3 mg/dL — ABNORMAL LOW (ref 8.9–10.3)
Chloride: 101 mmol/L (ref 98–111)
Creatinine, Ser: 1.99 mg/dL — ABNORMAL HIGH (ref 0.44–1.00)
GFR calc Af Amer: 26 mL/min — ABNORMAL LOW (ref >60)
GFR calc non Af Amer: 23 mL/min — ABNORMAL LOW (ref >60)
Glucose, Bld: 345 mg/dL — ABNORMAL HIGH (ref 70–99)
Potassium: 4.7 mmol/L (ref 3.5–5.1)
Sodium: 138 mmol/L (ref 135–145)

## 2018-10-27 LAB — GLUCOSE, CAPILLARY
Glucose-Capillary: 303 mg/dL — ABNORMAL HIGH (ref 70–99)
Glucose-Capillary: 451 mg/dL — ABNORMAL HIGH (ref 70–99)
Glucose-Capillary: 444 mg/dL — ABNORMAL HIGH (ref 70–99)
Glucose-Capillary: 406 mg/dL — ABNORMAL HIGH (ref 70–99)

## 2018-10-27 LAB — CBC
HCT: 31.7 % — ABNORMAL LOW (ref 36.0–46.0)
Hemoglobin: 10.8 g/dL — ABNORMAL LOW (ref 12.0–15.0)
MCH: 40.1 pg — ABNORMAL HIGH (ref 26.0–34.0)
MCHC: 34.1 g/dL (ref 30.0–36.0)
MCV: 117.8 fL — ABNORMAL HIGH (ref 80.0–100.0)
Platelets: 77 10*3/uL — ABNORMAL LOW (ref 150–400)
RBC: 2.69 MIL/uL — ABNORMAL LOW (ref 3.87–5.11)
RDW: 15.2 % (ref 11.5–15.5)
WBC: 2.5 10*3/uL — ABNORMAL LOW (ref 4.0–10.5)
nRBC: 0 % (ref 0.0–0.2)

## 2018-10-27 LAB — MRSA PCR SCREENING: MRSA by PCR: POSITIVE — AB

## 2018-10-27 MED ORDER — INSULIN ASPART 100 UNIT/ML ~~LOC~~ SOLN
0.0000 [IU] | Freq: Three times a day (TID) | SUBCUTANEOUS | Status: DC
  Administered 2018-10-27: 9 [IU] via SUBCUTANEOUS
  Administered 2018-10-27: 20 [IU] via SUBCUTANEOUS
  Administered 2018-10-28: 4 [IU] via SUBCUTANEOUS
  Administered 2018-10-28 – 2018-10-29 (×3): 7 [IU] via SUBCUTANEOUS
  Administered 2018-10-29: 3 [IU] via SUBCUTANEOUS
  Administered 2018-10-29: 7 [IU] via SUBCUTANEOUS
  Administered 2018-10-30: 4 [IU] via SUBCUTANEOUS
  Administered 2018-10-30 – 2018-10-31 (×2): 11 [IU] via SUBCUTANEOUS

## 2018-10-27 MED ORDER — INSULIN ASPART 100 UNIT/ML ~~LOC~~ SOLN
0.0000 [IU] | Freq: Every day | SUBCUTANEOUS | Status: DC
  Administered 2018-10-28 – 2018-10-29 (×2): 3 [IU] via SUBCUTANEOUS
  Administered 2018-10-30: 2 [IU] via SUBCUTANEOUS

## 2018-10-27 MED ORDER — MUPIROCIN 2 % EX OINT
1.0000 "application " | TOPICAL_OINTMENT | Freq: Two times a day (BID) | CUTANEOUS | Status: DC
  Administered 2018-10-27 – 2018-10-31 (×9): 1 via NASAL
  Filled 2018-10-27 (×2): qty 22

## 2018-10-27 MED ORDER — CHLORHEXIDINE GLUCONATE CLOTH 2 % EX PADS
6.0000 | MEDICATED_PAD | Freq: Every day | CUTANEOUS | Status: AC
  Administered 2018-10-27 – 2018-10-31 (×5): 6 via TOPICAL

## 2018-10-27 MED ORDER — INSULIN ASPART 100 UNIT/ML ~~LOC~~ SOLN
6.0000 [IU] | Freq: Once | SUBCUTANEOUS | Status: AC
  Administered 2018-10-27: 6 [IU] via SUBCUTANEOUS

## 2018-10-27 NOTE — Progress Notes (Signed)
Subjective: She says she feels a little better.  She is less short of breath.  She has no new complaints.  Objective: Vital signs in last 24 hours: Temp:  [97.1 F (36.2 C)-98 F (36.7 C)] 97.1 F (36.2 C) (08/30 0549) Pulse Rate:  [65-81] 65 (08/30 0549) Resp:  [16-22] 16 (08/30 0549) BP: (113-140)/(46-66) 118/59 (08/30 0549) SpO2:  [80 %-100 %] 96 % (08/30 0805) Weight:  [91.9 kg] 91.9 kg (08/29 1619) Weight change: -0.227 kg Last BM Date: 10/25/18  Intake/Output from previous day: 08/29 0701 - 08/30 0700 In: 1669.3 [P.O.:240; I.V.:979.3; IV Piggyback:450] Out: 600 [Urine:600]  PHYSICAL EXAM General appearance: alert, cooperative and no distress Resp: rhonchi bilaterally Cardio: regular rate and rhythm, S1, S2 normal, no murmur, click, rub or gallop GI: soft, non-tender; bowel sounds normal; no masses,  no organomegaly Extremities: extremities normal, atraumatic, no cyanosis or edema  Lab Results:  Results for orders placed or performed during the hospital encounter of 10/26/18 (from the past 48 hour(s))  Urinalysis, Routine w reflex microscopic     Status: Abnormal   Collection Time: 10/26/18  5:32 AM  Result Value Ref Range   Color, Urine YELLOW YELLOW   APPearance CLEAR CLEAR   Specific Gravity, Urine 1.012 1.005 - 1.030   pH 7.0 5.0 - 8.0   Glucose, UA 150 (A) NEGATIVE mg/dL   Hgb urine dipstick NEGATIVE NEGATIVE   Bilirubin Urine NEGATIVE NEGATIVE   Ketones, ur NEGATIVE NEGATIVE mg/dL   Protein, ur NEGATIVE NEGATIVE mg/dL   Nitrite NEGATIVE NEGATIVE   Leukocytes,Ua NEGATIVE NEGATIVE    Comment: Performed at [facility], [address], [place], [place] [address]  SARS Coronavirus 2 [facility] order, Performed in [facility] hospital lab) Nasopharyngeal Nasopharyngeal Swab     Status: None   Collection Time: 10/26/18  6:51 AM   Specimen: Nasopharyngeal Swab  Result Value Ref Range   SARS Coronavirus 2 NEGATIVE NEGATIVE    Comment: (NOTE) If result is  NEGATIVE SARS-CoV-2 target nucleic acids are NOT DETECTED. The SARS-CoV-2 RNA is generally detectable in upper and lower  respiratory specimens during the acute phase of infection. The lowest  concentration of SARS-CoV-2 viral copies this assay can detect is 250  copies / mL. A negative result does not preclude SARS-CoV-2 infection  and should not be used as the sole basis for treatment or other  patient management decisions.  A negative result may occur with  improper specimen collection / handling, submission of specimen other  than nasopharyngeal swab, presence of viral mutation(s) within the  areas targeted by this assay, and inadequate number of viral copies  (<250 copies / mL). A negative result must be combined with clinical  observations, patient history, and epidemiological information. If result is POSITIVE SARS-CoV-2 target nucleic acids are DETECTED. The SARS-CoV-2 RNA is generally detectable in upper and lower  respiratory specimens dur ing the acute phase of infection.  Positive  results are indicative of active infection with SARS-CoV-2.  Clinical  correlation with patient history and other diagnostic information is  necessary to determine patient infection status.  Positive results do  not rule out bacterial infection or co-infection with other viruses. If result is PRESUMPTIVE POSTIVE SARS-CoV-2 nucleic acids MAY BE PRESENT.   A presumptive positive result was obtained on the submitted specimen  and confirmed on repeat testing.  While 2019 novel coronavirus  (SARS-CoV-2) nucleic acids may be present in the submitted sample  additional confirmatory testing may be necessary for epidemiological  and / or clinical management purposes  to differentiate between  SARS-CoV-2 and other Sarbecovirus currently known to infect humans.  If clinically indicated additional testing with an alternate test  methodology [phone]) is advised. The SARS-CoV-2 RNA is generally  detectable  in upper and lower respiratory sp ecimens during the acute  phase of infection. The expected result is Negative. Fact Sheet for Patients:  StrictlyIdeas.no Fact Sheet for Healthcare Providers: BankingDealers.co.za This test is not yet approved or cleared by the [place] FDA and has been authorized for detection and/or diagnosis of SARS-CoV-2 by FDA under an Emergency Use Authorization (EUA).  This EUA will remain in effect (meaning this test can be used) for the duration of the COVID-19 declaration under Section 564(b)(1) of the Act, 21 U.S.C. section 360bbb-3(b)(1), unless the authorization is terminated or revoked sooner. Performed at [facility], [address], [place], [place] [address]   Lactic acid, plasma     Status: None   Collection Time: 10/26/18  7:17 AM  Result Value Ref Range   Lactic Acid, Venous 1.9 0.5 - 1.9 mmol/L    Comment: Performed at [facility], [address] [place], [place] [address]  Comprehensive metabolic panel     Status: Abnormal   Collection Time: 10/26/18  7:48 AM  Result Value Ref Range   Sodium 137 135 - 145 mmol/L   Potassium 3.9 3.5 - 5.1 mmol/L   Chloride 99 98 - 111 mmol/L   CO2 27 22 - 32 mmol/L   Glucose, Bld 270 (H) 70 - 99 mg/dL   BUN 32 (H) 8 - 23 mg/dL   Creatinine, Ser 2.34 (H) 0.44 - 1.00 mg/dL   Calcium 8.7 (L) 8.9 - 10.3 mg/dL   Total Protein 6.6 6.5 - 8.1 g/dL   Albumin 3.3 (L) 3.5 - 5.0 g/dL   AST 52 (H) 15 - 41 U/L   ALT 23 0 - 44 U/L   Alkaline Phosphatase 74 38 - 126 U/L   Total Bilirubin 1.0 0.3 - 1.2 mg/dL   GFR calc non Af Amer 19 (L) >60 mL/min   GFR calc Af Amer 22 (L) >60 mL/min   Anion gap 11 5 - 15    Comment: Performed at [facility], [address] [place], [place] [address]  CBC with Differential     Status: Abnormal   Collection Time: 10/26/18  7:48 AM  Result Value Ref Range   WBC 4.3 4.0 - 10.5 K/uL   RBC 2.83 (L) 3.87 - 5.11 MIL/uL   Hemoglobin 11.1  (L) 12.0 - 15.0 g/dL   HCT 32.9 (L) 36.0 - 46.0 %   MCV 116.3 (H) 80.0 - 100.0 fL   MCH 39.2 (H) 26.0 - 34.0 pg   MCHC 33.7 30.0 - 36.0 g/dL   RDW 15.2 11.5 - 15.5 %   Platelets 101 (L) 150 - 400 K/uL    Comment: SPECIMEN CHECKED FOR CLOTS CONSISTENT WITH PREVIOUS RESULT    nRBC 0.0 0.0 - 0.2 %   Neutrophils Relative % 71 %   Neutro Abs 3.1 1.7 - 7.7 K/uL   Lymphocytes Relative 20 %   Lymphs Abs 0.9 0.7 - 4.0 K/uL   Monocytes Relative 7 %   Monocytes Absolute 0.3 0.1 - 1.0 K/uL   Eosinophils Relative 0 %   Eosinophils Absolute 0.0 0.0 - 0.5 K/uL   Basophils Relative 1 %   Basophils Absolute 0.0 0.0 - 0.1 K/uL   RBC Morphology MACROSYTOSIS PRESENT  Immature Granulocytes 1 %   Abs Immature Granulocytes 0.04 0.00 - 0.07 K/uL   Polychromasia PRESENT     Comment: Performed at [facility], [address], [place], [place] [address]  Ammonia     Status: None   Collection Time: 10/26/18  7:48 AM  Result Value Ref Range   Ammonia 29 9 - 35 umol/L    Comment: Performed at [facility], [address] [place], [place] [address]  Lipase, blood     Status: None   Collection Time: 10/26/18  7:48 AM  Result Value Ref Range   Lipase 19 11 - 51 U/L    Comment: Performed at [facility], [address] [place], [place] [address]  Lactic acid, plasma     Status: None   Collection Time: 10/26/18 10:01 AM  Result Value Ref Range   Lactic Acid, Venous 1.7 0.5 - 1.9 mmol/L    Comment: Performed at [facility], [address] [place], [place] [address]  CBG monitoring, ED     Status: Abnormal   Collection Time: 10/26/18 12:47 PM  Result Value Ref Range   Glucose-Capillary 198 (H) 70 - 99 mg/dL  Glucose, capillary     Status: Abnormal   Collection Time: 10/26/18  5:05 PM  Result Value Ref Range   Glucose-Capillary 252 (H) 70 - 99 mg/dL  Glucose, capillary     Status: Abnormal   Collection Time: 10/26/18 10:13 PM  Result Value Ref Range   Glucose-Capillary 332 (H) 70 - 99 mg/dL  Basic  metabolic panel     Status: Abnormal   Collection Time: 10/27/18  7:21 AM  Result Value Ref Range   Sodium 138 135 - 145 mmol/L   Potassium 4.7 3.5 - 5.1 mmol/L    Comment: DELTA CHECK NOTED   Chloride 101 98 - 111 mmol/L   CO2 29 22 - 32 mmol/L   Glucose, Bld 345 (H) 70 - 99 mg/dL   BUN 33 (H) 8 - 23 mg/dL   Creatinine, Ser 1.99 (H) 0.44 - 1.00 mg/dL   Calcium 8.3 (L) 8.9 - 10.3 mg/dL   GFR calc non Af Amer 23 (L) >60 mL/min   GFR calc Af Amer 26 (L) >60 mL/min   Anion gap 8 5 - 15    Comment: Performed at [facility], [address], [place], [place] [address]  CBC     Status: Abnormal   Collection Time: 10/27/18  7:21 AM  Result Value Ref Range   WBC 2.5 (L) 4.0 - 10.5 K/uL   RBC 2.69 (L) 3.87 - 5.11 MIL/uL   Hemoglobin 10.8 (L) 12.0 - 15.0 g/dL   HCT 31.7 (L) 36.0 - 46.0 %   MCV 117.8 (H) 80.0 - 100.0 fL   MCH 40.1 (H) 26.0 - 34.0 pg   MCHC 34.1 30.0 - 36.0 g/dL   RDW 15.2 11.5 - 15.5 %   Platelets 77 (L) 150 - 400 K/uL    Comment: REPEATED TO VERIFY SPECIMEN CHECKED FOR CLOTS Immature Platelet Fraction may be clinically indicated, consider ordering this additional test [id number] CONSISTENT WITH PREVIOUS RESULT    nRBC 0.0 0.0 - 0.2 %    Comment: Performed at [facility], [address] [place], [place] [address]  Glucose, capillary     Status: Abnormal   Collection Time: 10/27/18  7:34 AM  Result Value Ref Range   Glucose-Capillary 303 (H) 70 - 99 mg/dL   Comment 1 Notify RN    Comment  2 Document in Chart     ABGS No results for input(s): PHART, PO2ART, TCO2, HCO3 in the last 72 hours.  Invalid input(s): PCO2 CULTURES Recent Results (from the past 240 hour(s))  SARS Coronavirus 2 [facility] order, Performed in [facility] hospital lab) Nasopharyngeal Nasopharyngeal Swab     Status: None   Collection Time: 10/26/18  6:51 AM   Specimen: Nasopharyngeal Swab  Result Value Ref Range Status   SARS Coronavirus 2 NEGATIVE NEGATIVE Final    Comment: (NOTE) If  result is NEGATIVE SARS-CoV-2 target nucleic acids are NOT DETECTED. The SARS-CoV-2 RNA is generally detectable in upper and lower  respiratory specimens during the acute phase of infection. The lowest  concentration of SARS-CoV-2 viral copies this assay can detect is 250  copies / mL. A negative result does not preclude SARS-CoV-2 infection  and should not be used as the sole basis for treatment or other  patient management decisions.  A negative result may occur with  improper specimen collection / handling, submission of specimen other  than nasopharyngeal swab, presence of viral mutation(s) within the  areas targeted by this assay, and inadequate number of viral copies  (<250 copies / mL). A negative result must be combined with clinical  observations, patient history, and epidemiological information. If result is POSITIVE SARS-CoV-2 target nucleic acids are DETECTED. The SARS-CoV-2 RNA is generally detectable in upper and lower  respiratory specimens dur ing the acute phase of infection.  Positive  results are indicative of active infection with SARS-CoV-2.  Clinical  correlation with patient history and other diagnostic information is  necessary to determine patient infection status.  Positive results do  not rule out bacterial infection or co-infection with other viruses. If result is PRESUMPTIVE POSTIVE SARS-CoV-2 nucleic acids MAY BE PRESENT.   A presumptive positive result was obtained on the submitted specimen  and confirmed on repeat testing.  While 2019 novel coronavirus  (SARS-CoV-2) nucleic acids may be present in the submitted sample  additional confirmatory testing may be necessary for epidemiological  and / or clinical management purposes  to differentiate between  SARS-CoV-2 and other Sarbecovirus currently known to infect humans.  If clinically indicated additional testing with an alternate test  methodology [phone]) is advised. The SARS-CoV-2 RNA is generally   detectable in upper and lower respiratory sp ecimens during the acute  phase of infection. The expected result is Negative. Fact Sheet for Patients:  StrictlyIdeas.no Fact Sheet for Healthcare Providers: BankingDealers.co.za This test is not yet approved or cleared by the [place] FDA and has been authorized for detection and/or diagnosis of SARS-CoV-2 by FDA under an Emergency Use Authorization (EUA).  This EUA will remain in effect (meaning this test can be used) for the duration of the COVID-19 declaration under Section 564(b)(1) of the Act, 21 U.S.C. section 360bbb-3(b)(1), unless the authorization is terminated or revoked sooner. Performed at [facility], [address] [place], [place] [address]    Studies/Results: Ct Abdomen Pelvis Wo Contrast  Result Date: 10/26/2018 CLINICAL DATA:  Fever. Right-sided flank pain. Altered mental status. History of left-sided breast cancer. EXAM: CT CHEST, ABDOMEN AND PELVIS WITHOUT CONTRAST TECHNIQUE: Multidetector CT imaging of the chest, abdomen and pelvis was performed following the standard protocol without IV contrast. COMPARISON:  CT chest dated April 11, 2018. Abdominal ultrasound dated June 09, 2014. CT abdomen pelvis dated April 10, 2011. FINDINGS: CT CHEST FINDINGS Cardiovascular: Normal heart size. No pericardial effusion. No thoracic aortic aneurysm. Coronary, aortic arch, and  branch vessel atherosclerotic vascular disease. Mediastinum/Nodes: No enlarged mediastinal, hilar, or axillary lymph nodes. Thyroid gland, trachea, and esophagus demonstrate no significant findings. Lungs/Pleura: There are few scattered small faint ground-glass densities in the right upper lobe (series 3, image 39) and left upper lobe (series 3, image 59). Scattered subsegmental atelectasis and scarring. No focal consolidation, pleural effusion, or pneumothorax. Musculoskeletal: Unchanged left breast skin  thickening and retroareolar soft tissue fullness. No acute or significant osseous findings. Chronic T12 compression deformity. CT ABDOMEN PELVIS FINDINGS Hepatobiliary: Unchanged cirrhosis. No focal liver abnormality. Prior cholecystectomy. No biliary dilatation. Pancreas: Unremarkable. No pancreatic ductal dilatation or surrounding inflammatory changes. Spleen: Unchanged splenomegaly and 1.7 cm low-density lesion in the anterior spleen. Adrenals/Urinary Tract: The adrenal glands are unremarkable. Unchanged left renal atrophy. No renal calculi or hydronephrosis. The bladder is unremarkable. Stomach/Bowel: Stomach is within normal limits. Appendix appears normal. No evidence of bowel wall thickening, distention, or inflammatory changes. Moderate left-sided colonic diverticulosis. Vascular/Lymphatic: Minimal aortic atherosclerosis. No enlarged abdominal or pelvic lymph nodes. Reproductive: Status post hysterectomy. No adnexal masses. Other: No abdominal wall hernia or abnormality. Trace perihepatic ascites. No pneumoperitoneum. Musculoskeletal: No acute or significant osseous findings. Prior lumbar decompression and fusion. Chronic L1 and L4 compression deformities. Progressive severe spondylosis at T12-L1. IMPRESSION: Chest: 1. A few scattered small faint ground-glass densities in both upper lobes may be infectious or inflammatory. No consolidative pneumonia. 2. Unchanged left breast skin thickening and retro soft tissue fullness, nonspecific in the setting of prior radiation. 3.  Aortic atherosclerosis (ICD10-I70.0). Abdomen and pelvis: 1.  No acute intra-abdominal process. 2. Unchanged cirrhosis and splenomegaly. Electronically Signed   By: Titus Dubin M.D.   On: 10/26/2018 10:34   Ct Chest Wo Contrast  Result Date: 10/26/2018 CLINICAL DATA:  Fever. Right-sided flank pain. Altered mental status. History of left-sided breast cancer. EXAM: CT CHEST, ABDOMEN AND PELVIS WITHOUT CONTRAST TECHNIQUE: Multidetector  CT imaging of the chest, abdomen and pelvis was performed following the standard protocol without IV contrast. COMPARISON:  CT chest dated April 11, 2018. Abdominal ultrasound dated June 09, 2014. CT abdomen pelvis dated April 10, 2011. FINDINGS: CT CHEST FINDINGS Cardiovascular: Normal heart size. No pericardial effusion. No thoracic aortic aneurysm. Coronary, aortic arch, and branch vessel atherosclerotic vascular disease. Mediastinum/Nodes: No enlarged mediastinal, hilar, or axillary lymph nodes. Thyroid gland, trachea, and esophagus demonstrate no significant findings. Lungs/Pleura: There are few scattered small faint ground-glass densities in the right upper lobe (series 3, image 39) and left upper lobe (series 3, image 59). Scattered subsegmental atelectasis and scarring. No focal consolidation, pleural effusion, or pneumothorax. Musculoskeletal: Unchanged left breast skin thickening and retroareolar soft tissue fullness. No acute or significant osseous findings. Chronic T12 compression deformity. CT ABDOMEN PELVIS FINDINGS Hepatobiliary: Unchanged cirrhosis. No focal liver abnormality. Prior cholecystectomy. No biliary dilatation. Pancreas: Unremarkable. No pancreatic ductal dilatation or surrounding inflammatory changes. Spleen: Unchanged splenomegaly and 1.7 cm low-density lesion in the anterior spleen. Adrenals/Urinary Tract: The adrenal glands are unremarkable. Unchanged left renal atrophy. No renal calculi or hydronephrosis. The bladder is unremarkable. Stomach/Bowel: Stomach is within normal limits. Appendix appears normal. No evidence of bowel wall thickening, distention, or inflammatory changes. Moderate left-sided colonic diverticulosis. Vascular/Lymphatic: Minimal aortic atherosclerosis. No enlarged abdominal or pelvic lymph nodes. Reproductive: Status post hysterectomy. No adnexal masses. Other: No abdominal wall hernia or abnormality. Trace perihepatic ascites. No pneumoperitoneum.  Musculoskeletal: No acute or significant osseous findings. Prior lumbar decompression and fusion. Chronic L1 and L4 compression deformities. Progressive severe spondylosis at T12-L1.  IMPRESSION: Chest: 1. A few scattered small faint ground-glass densities in both upper lobes may be infectious or inflammatory. No consolidative pneumonia. 2. Unchanged left breast skin thickening and retro soft tissue fullness, nonspecific in the setting of prior radiation. 3.  Aortic atherosclerosis (ICD10-I70.0). Abdomen and pelvis: 1.  No acute intra-abdominal process. 2. Unchanged cirrhosis and splenomegaly. Electronically Signed   By: Titus Dubin M.D.   On: 10/26/2018 10:34   Dg Chest Port 1 View  Result Date: 10/26/2018 CLINICAL DATA:  Weakness and confusion. EXAM: PORTABLE CHEST 1 VIEW COMPARISON:  04/15/2018 FINDINGS: Chronic cardiomegaly with left ventricular prominence. Chronic aortic atherosclerosis. Chronic abnormal lung markings. Slight increase in patchy density at the lung bases could be atelectasis or mild basilar pneumonia. No visible effusion. IMPRESSION: Chronic cardiomegaly and lung markings. Slightly more prominent markings at the lung bases than seen previously. This could relate to mild atelectasis or mild basilar pneumonia. Electronically Signed   By: Nelson Chimes M.D.   On: 10/26/2018 07:37    Medications:  Prior to Admission:  Medications Prior to Admission  Medication Sig Dispense Refill Last Dose  . acyclovir (ZOVIRAX) 400 MG tablet Take 1 tablet (400 mg total) by mouth 2 (two) times daily. 120 tablet 3 10/25/2018 at Unknown time  . ALPHAGAN P 0.1 % SOLN Place 1 drop into both eyes 2 (two) times daily.    10/25/2018 at Unknown time  . amLODipine (NORVASC) 5 MG tablet Take 5 mg by mouth every morning.    10/25/2018 at Unknown time  . aspirin EC 81 MG tablet Take 81 mg by mouth daily.   10/25/2018 at 1000  . atorvastatin (LIPITOR) 10 MG tablet Take 10 mg by mouth every evening.    10/25/2018 at  Unknown time  . Bacillus Coagulans-Inulin (PROBIOTIC FORMULA PO) Take 1 capsule by mouth daily.   10/25/2018 at Unknown time  . BD PEN NEEDLE NANO U/F 32G X 4 MM MISC      . cyanocobalamin (,VITAMIN B-12,) 1000 MCG/ML injection Inject 1,000 mcg into the muscle every 30 (thirty) days.    Past Month at Unknown time  . dorzolamide (TRUSOPT) 2 % ophthalmic solution Place 1 drop into both eyes 2 (two) times daily.   4 10/25/2018 at Unknown time  . EPIPEN 2-PAK 0.3 MG/0.3ML SOAJ injection Inject 0.3 mg into the muscle as needed for anaphylaxis.      . fulvestrant (FASLODEX) 250 MG/5ML injection Inject 500 mg into the muscle every 14 (fourteen) days. One injection each buttock over 1-2 minutes. Warm prior to use.     . gabapentin (NEURONTIN) 600 MG tablet Take 600 mg by mouth daily.    10/25/2018 at Unknown time  . glipiZIDE (GLUCOTROL) 5 MG tablet Take 5 mg by mouth daily before breakfast.    10/25/2018 at Unknown time  . guaiFENesin-dextromethorphan (ROBITUSSIN DM) 100-10 MG/5ML syrup Take 5 mLs by mouth every 4 (four) hours as needed for cough. 118 mL 0   . ipratropium-albuterol (DUONEB) 0.5-2.5 (3) MG/3ML SOLN Take 3 mLs by nebulization 3 (three) times daily. 360 mL 3 10/25/2018 at Unknown time  . Lancets (ONETOUCH DELICA PLUS [id number]) MISC      . latanoprost (XALATAN) 0.005 % ophthalmic solution Place 1 drop into both eyes at bedtime.    10/25/2018 at Unknown time  . methocarbamol (ROBAXIN) 500 MG tablet Take 500 mg by mouth 2 (two) times daily as needed for muscle spasms.   Past Week at Unknown time  . metoprolol succinate (  TOPROL-XL) 100 MG 24 hr tablet Take 100 mg by mouth every morning. Take with or immediately following a meal.   10/25/2018 at 100  . mirtazapine (REMERON) 15 MG tablet Take 15 mg by mouth at bedtime.    10/25/2018 at Unknown time  . MYRBETRIQ 50 MG TB24 tablet Take 50 mg by mouth daily.    10/25/2018 at Unknown time  . omeprazole (PRILOSEC) 20 MG capsule Take 20 mg by mouth daily.    10/25/2018 at Unknown time  . ONETOUCH ULTRA test strip      . palbociclib (IBRANCE) 75 MG tablet Take 1 tablet (75 mg total) by mouth daily. Take for 21 days on, 7 days off, repeat every 28 days. 21 tablet 6 10/25/2018 at Unknown time  . Polyethyl Glycol-Propyl Glycol (SYSTANE ULTRA) 0.4-0.3 % SOLN Place 1-2 drops into both eyes daily as needed (for itching eyes).   10/25/2018 at Unknown time  . tamoxifen (NOLVADEX) 20 MG tablet Take 1 tablet (20 mg total) by mouth daily. 90 tablet 12 10/25/2018 at Unknown time  . torsemide (DEMADEX) 20 MG tablet Take 1 tablet (20 mg total) by mouth daily. 30 tablet 5 10/25/2018 at Unknown time  . TRESIBA FLEXTOUCH 100 UNIT/ML SOPN FlexTouch Pen Inject 0.5 mLs (50 Units total) into the skin daily. 5 pen 5 10/25/2018 at Unknown time   Scheduled: . acyclovir  400 mg Oral BID  . amLODipine  5 mg Oral q morning - 10a  . aspirin EC  81 mg Oral Q breakfast  . atorvastatin  10 mg Oral QPM  . brimonidine  1 drop Both Eyes BID  . [START ON 11/11/2018] cyanocobalamin  1,000 mcg Intramuscular Q30 days  . dorzolamide  1 drop Both Eyes BID  . gabapentin  600 mg Oral Daily  . heparin  5,000 Units Subcutaneous Q8H  . insulin aspart  0-5 Units Subcutaneous QHS  . insulin aspart  0-9 Units Subcutaneous TID WC  . insulin glargine  50 Units Subcutaneous Daily  . ipratropium-albuterol  3 mL Nebulization TID  . latanoprost  1 drop Both Eyes QHS  . mouth rinse  15 mL Mouth Rinse BID  . metoprolol succinate  100 mg Oral QPC breakfast  . mirabegron ER  25 mg Oral Daily  . mirtazapine  15 mg Oral QHS  . palbociclib  75 mg Oral Daily  . pantoprazole  40 mg Oral Daily  . predniSONE  40 mg Oral Q breakfast  . saccharomyces boulardii  250 mg Oral Daily  . sodium chloride flush  3 mL Intravenous Q12H  . tamoxifen  20 mg Oral Daily  . torsemide  20 mg Oral Daily   Continuous: . sodium chloride 50 mL/hr at 10/27/18 0534  . sodium chloride    . azithromycin Stopped (10/26/18 1313)   . cefTRIAXone (ROCEPHIN)  IV Stopped (10/26/18 1157)   OVF:IEPPIR chloride, acetaminophen **OR** acetaminophen, albuterol, guaiFENesin-dextromethorphan, hydrALAZINE, methocarbamol, ondansetron **OR** ondansetron (ZOFRAN) IV, polyethylene glycol, polyvinyl alcohol, sodium chloride flush  Assesment: She was admitted with acute hypoxic respiratory failure.  She has community-acquired pneumonia.  She is improving.  She has hypertension which is stable  She has diabetes and blood sugars reasonably well controlled   She had metabolic encephalopathy likely related to her hypoxia  She has generalized weakness  She has breast cancer on treatment Principal Problem:   Acute Respiratory failure with Hypoxia ([place]) Active Problems:   HTN (hypertension)   DM type 2 (diabetes mellitus, type 2) ([place])  Hyperlipidemia   Hypertension   Acute Metabolic Encephalopathy acute   Acute hypoxemic respiratory failure (HCC)   Fever    Plan: Continue treatments.  Full admission.    LOS: 0 days   [name] 10/27/2018, 9:17 AM

## 2018-10-27 NOTE — Progress Notes (Signed)
CRITICAL VALUE ALERT  Critical Value:  455  Date & Time Notied: 9787  Provider Notified: Dr. Luan Pulling  Orders Received/Actions taken: yes

## 2018-10-27 NOTE — Evaluation (Signed)
Physical Therapy Evaluation Patient Details Name: [name] MRN: [id number] DOB: Dec 08, 1933 Today's Date: 10/27/2018   History of Present Illness  [name]  is a 85 y.o. female with recurrent left breast invasive ductal carcinoma,  status post left lumpectomy, h/o DM2, HLD, HTN who presented to the ED with concerns about fevers up to 101, confusion and disorientation. She was admitted with acute hypoxic respiratory failure. She has community-acquired pneumonia    Clinical Impression  [name] is a 85 y.o. female admitted with above HPI and diagnosis. She reports at baseline she is independent with a rollator to mobilize in home (currently living with daughter) and requires supervision for gait and ADL's. Her daughter and son-in-law assist her with ADL's and are available at home to supervise her mobility as her daughter works from home. She currently requires min guard for transfers and min assist for gait with RW and became fatigue easily with ~ 25 feet of ambulation on 2L/min O2. Pt is limited by current functional impairments (see PT problem list below) and will benefit from additional skilled PT services at below setting. Pt and daughter are declining SNF for rehab as they feel her daughter can provide assistance level required. Acute PT will follow and progress mobility as able.     Follow Up Recommendations Home health PT;Supervision for mobility/OOB    Equipment Recommendations  None recommended by PT    Recommendations for Other Services OT consult(pt/family requesting for assitance with toileting difficulties)     Precautions / Restrictions Precautions Precautions: Fall Restrictions Weight Bearing Restrictions: No      Mobility  Bed Mobility Overal bed mobility: Needs Assistance Bed Mobility: Supine to Sit;Sit to Supine     Supine to sit: Supervision;HOB elevated Sit to supine: HOB elevated;Supervision      Transfers Overall transfer level: Needs  assistance Equipment used: Rolling walker (2 wheeled) Transfers: Sit to/from Stand Sit to Stand: Min guard            Ambulation/Gait Ambulation/Gait assistance: Herbalist (Feet): 25 Feet(within hospital room) Assistive device: Rolling walker (2 wheeled) Gait Pattern/deviations: Step-through pattern;Decreased step length - left;Decreased step length - right;Decreased stride length;Trunk flexed Gait velocity: decreased and fatigued quickly   General Gait Details: pt performed forward and backward gait with min asisstance and RW, cues required to maintain safe hand placement and safe proximety to [employer] Mobility    Modified Rankin (Stroke Patients Only)       Balance Overall balance assessment: Needs assistance   Sitting balance-Leahy Scale: Good Sitting balance - Comments: pt maintained sitting balance EOB with no UE support and while weight shifting to adjust gown   Standing balance support: During functional activity;Bilateral upper extremity supported Standing balance-Leahy Scale: Fair Standing balance comment: pt requires UE support for dynamic standing and gait             High level balance activites: Backward walking;Turns High Level Balance Comments: pt with decreased step length and velocity with backwards gait, cues needed for RW management in both backwards gait and during turns             Pertinent Vitals/Pain Pain Assessment: No/denies pain    Home Living Family/patient expects to be discharged to:: Private residence Living Arrangements: Children(lives with her daughter [name] and son-in-law) Available Help at Discharge: Family Type of Home: House Home Access: Stairs to enter Entrance Stairs-Rails: Right;Can  reach both(2 hand rails at her daughters) Technical brewer of Steps: 4 at her daughters and 2 stairs at her home Home Layout: One level Home Equipment: Kasandra Knudsen - single point;Shower  seat;Hand held Tourist information centre manager - 4 wheels;Grab bars - tub/shower;Grab bars - toilet Additional Comments: pt's daughter or son-in-law take her to her home throughout the week to shower in her own bathroom as that is where the shower seat is, she lives/stays at her daughters house currently and sleeps in a recliner    Prior Function Level of Independence: Needs assistance   Gait / Transfers Assistance Needed: household ambulator with Rollator  ADL's / Homemaking Assistance Needed: assisted by daughter  Comments: pt amb with rollator in her daughters home, has supervision for mobility     Hand Dominance   Dominant Hand: Right    Extremity/Trunk Assessment   Upper Extremity Assessment Upper Extremity Assessment: Defer to OT evaluation    Lower Extremity Assessment Lower Extremity Assessment: Generalized weakness    Cervical / Trunk Assessment Cervical / Trunk Assessment: Kyphotic  Communication   Communication: No difficulties  Cognition Arousal/Alertness: Awake/alert Behavior During Therapy: WFL for tasks assessed/performed Overall Cognitive Status: Within Functional Limits for tasks assessed                                        General Comments      Exercises     Assessment/Plan    PT Assessment Patient needs continued PT services  PT Problem List Decreased strength;Decreased balance;Decreased mobility;Decreased activity tolerance       PT Treatment Interventions DME instruction;Gait training;Therapeutic activities;Functional mobility training;Balance training;Patient/family education;Stair training    PT Goals (Current goals can be found in the Care Plan section)  Acute Rehab PT Goals Patient Stated Goal: to return home and gain back independence/strength PT Goal Formulation: With patient/family Time For Goal Achievement: 11/03/18 Potential to Achieve Goals: Good    Frequency Min 3X/week   Barriers to discharge        Co-evaluation                AM-PAC PT "6 Clicks" Mobility  Outcome Measure Help needed turning from your back to your side while in a flat bed without using bedrails?: A Little Help needed moving from lying on your back to sitting on the side of a flat bed without using bedrails?: A Little Help needed moving to and from a bed to a chair (including a wheelchair)?: A Little Help needed standing up from a chair using your arms (e.g., wheelchair or bedside chair)?: A Little Help needed to walk in hospital room?: A Little Help needed climbing 3-5 steps with a railing? : A Lot 6 Click Score: 17    End of Session Equipment Utilized During Treatment: Gait belt Activity Tolerance: Patient tolerated treatment well Patient left: in bed;with call bell/phone within reach;with bed alarm set;with family/visitor present Nurse Communication: Mobility status PT Visit Diagnosis: Unsteadiness on feet (R26.81);Difficulty in walking, not elsewhere classified (R26.2)    Time: 1723-1750 PT Time Calculation (min) (ACUTE ONLY): 27 min   Charges:   PT Evaluation $PT Eval Low Complexity: 1 Low PT Treatments $Gait Training: 8-22 mins        Kipp Brood, PT, DPT, [facility] Physical Therapist with [facility]  10/27/2018 6:56 PM

## 2018-10-28 LAB — GLUCOSE, CAPILLARY
Glucose-Capillary: 193 mg/dL — ABNORMAL HIGH (ref 70–99)
Glucose-Capillary: 212 mg/dL — ABNORMAL HIGH (ref 70–99)
Glucose-Capillary: 218 mg/dL — ABNORMAL HIGH (ref 70–99)
Glucose-Capillary: 277 mg/dL — ABNORMAL HIGH (ref 70–99)

## 2018-10-28 LAB — HEMOGLOBIN A1C
Hgb A1c MFr Bld: 10.2 % — ABNORMAL HIGH (ref 4.8–5.6)
Mean Plasma Glucose: 246.04 mg/dL

## 2018-10-28 MED ORDER — INSULIN ASPART 100 UNIT/ML ~~LOC~~ SOLN
3.0000 [IU] | Freq: Three times a day (TID) | SUBCUTANEOUS | Status: DC
  Administered 2018-10-28 (×2): 3 [IU] via SUBCUTANEOUS

## 2018-10-28 NOTE — TOC Initial Note (Addendum)
Transition of Care [facility]) - Initial/Assessment Note    Patient Details  Name: [name] MRN: [id number] Date of Birth: 1933-12-20  [facility] CM/SW Contact:    Trish Mage, LCSW Phone Number: 10/28/2018, 1:11 PM  Clinical Narrative:  Returning patient here for treatment of Acute Respiratory Failure with Hypoxia.  Until recently, she was living alone, but is now staying with daughter who lives just down the road.  Daughter takes her to her home to shower, and will continue to do so as weather and season permit.  Ms Skeet has DME equipment that she needs, with the exception of O2 as she was needing none prior to admission.  She and daughter gave me permission to contact a supplier if need be-they had no preferences of providers.  They both would like referral for PT and RN to be sent to [facility] as they have used them in past, and were happy with services.  TOC will continue to follow for services.    Of note, daughter states she is healthcare POA, not guardian.  Pt will require order for [facility] RN and PT at d/c.               Expected Discharge Plan: [place] Barriers to Discharge: No Barriers Identified   Patient Goals and CMS Choice Patient states their goals for this hospitalization and ongoing recovery are:: "I am going to stay with my daughter when I leave the hospital."      Expected Discharge Plan and Services Expected Discharge Plan: [place]   Discharge Planning Services: CM Consult Post Acute Care Choice: [place] arrangements for the past 2 months: Single Family Home                           HH Arranged: PT, OT, RN [facility] Agency: [place] ([facility]) Date HH Agency Contacted: 10/28/18 Time [place]: 53 Representative spoke with at [place]: Jane Arrangements/Services Living arrangements for the past 2 months: [place] with:: Adult  Children Patient language and need for interpreter reviewed:: Yes Do you feel safe going back to the place where you live?: Yes      Need for Family Participation in Patient Care: Yes (Comment) Care giver support system in place?: Yes (comment) Current home services: DME Criminal Activity/Legal Involvement Pertinent to Current Situation/Hospitalization: No - Comment as needed  Activities of Daily Living Home Assistive Devices/Equipment: Walker (specify type)(front wheel ) ADL Screening (condition at time of admission) Patient's cognitive ability adequate to safely complete daily activities?: Yes Is the patient deaf or have difficulty hearing?: Yes Does the patient have difficulty seeing, even when wearing glasses/contacts?: No Does the patient have difficulty concentrating, remembering, or making decisions?: No Patient able to express need for assistance with ADLs?: Yes Does the patient have difficulty dressing or bathing?: No Independently performs ADLs?: No Communication: Independent Dressing (OT): Independent, Appropriate for developmental age Grooming: Needs assistance Is this a change from baseline?: Pre-admission baseline Feeding: Independent Bathing: Needs assistance Is this a change from baseline?: Change from baseline, expected to last <3 days Toileting: Needs assistance Is this a change from baseline?: Change from baseline, expected to last <3 days In/Out Bed: Needs assistance Is this a change from baseline?: Change from baseline, expected to last <3 days Walks in Home: Needs assistance Is this a change from baseline?:  Change from baseline, expected to last <3 days Does the patient have difficulty walking or climbing stairs?: Yes Weakness of Legs: None Weakness of Arms/Hands: None  Permission Sought/Granted Permission sought to share information with : Family Supports Permission granted to share information with : Yes, Verbal Permission Granted  Share Information with  NAME: Eben Burow     Permission granted to share info w Relationship: daughter  Permission granted to share info w Contact Information: [phone]  Emotional Assessment Appearance:: Appears stated age Attitude/Demeanor/Rapport: Engaged Affect (typically observed): Appropriate Orientation: : Oriented to Self, Oriented to Place, Oriented to  Time, Oriented to Situation Alcohol / Substance Use: Not Applicable Psych Involvement: No (comment)  Admission diagnosis:  Hypoxia [R09.02] Fever, unspecified fever cause [R50.9] Respiratory failure with hypoxia ([place]) [J96.91] Patient Active Problem List   Diagnosis Date Noted  . Community acquired pneumonia 10/27/2018  . Fever 10/26/2018  . Acute Respiratory failure with Hypoxia ([place]) 10/26/2018  . Acute hypoxemic respiratory failure ([place]) 04/15/2018  . Acute on chronic diastolic CHF (congestive heart failure) ([place]) 04/15/2018  . Malignant neoplasm of overlapping sites of left breast in female, estrogen receptor positive ([place]) 04/03/2018  . Recurrent breast adenocarcinoma, left ([place]) 04/03/2018  . Altered mental status 08/18/2017  . Acute encephalopathy 08/17/2017  . Acute Metabolic Encephalopathy acute 08/17/2017  . Encephalopathy 07/25/2017  . Diabetes mellitus ([place]) 09/02/2015  . Anemia due to multiple mechanisms 02/17/2015  . Chronic LBP 02/17/2015  . Impaired renal function 02/17/2015  . Abducens nerve weakness 02/17/2015  . S/P knee replacement 12/18/2014  . Osteoarthritis of right knee 12/18/2014  . IDA (iron deficiency anemia) 10/29/2013  . Vitamin B12 deficiency anemia 09/09/2013  . Anemia, iron deficiency 09/09/2013  . Lumbago 08/06/2013  . Difficulty in walking(719.7) 08/06/2013  . Osteopenia 03/11/2013  . Malignant neoplasm of lower-inner quadrant of left breast in female, estrogen receptor positive ([place]) 03/10/2013  . Bilateral leg weakness 09/23/2012  . Acute renal failure ([place]) 07/25/2012  . Dehydration 07/25/2012   . DM type 2, uncontrolled, with neuropathy ([place]) 07/25/2012  . DM (diabetes mellitus), type 2, uncontrolled ([place]) 07/25/2012  . Essential hypertension, benign 07/25/2012  . Generalized weakness 07/25/2012  . Hyperlipidemia   . Chronic kidney disease   . Hypertension   . GERD (gastroesophageal reflux disease)   . CAP (community acquired pneumonia) 04/19/2011  . Nausea and vomiting 04/19/2011  . HTN (hypertension) 04/19/2011  . DM type 2 (diabetes mellitus, type 2) ([place]) 04/19/2011  . Arthritis 04/19/2011   PCP:  Sinda Du, MD Pharmacy:   [facility], [place] [address] Phone: [phone] Fax: [place], [place] - [address] [place] [address] Phone: [phone] Fax: [phone]     Social Determinants of Health (SDOH) Interventions    Readmission Risk Interventions No flowsheet data found.

## 2018-10-28 NOTE — Plan of Care (Signed)

## 2018-10-28 NOTE — Progress Notes (Signed)
Inpatient Diabetes Program Recommendations  AACE/ADA: New Consensus Statement on Inpatient Glycemic Control   Target Ranges:  Prepandial:   less than 140 mg/dL      [place]ak postprandial:   less than 180 mg/dL (1-2 hours)      Critically ill patients:  140 - 180 mg/dL   Results for CANDA, PODGORSKI (MRN [id number]) as of 10/28/2018 12:36  Ref. Range 10/27/2018 07:34 10/27/2018 11:27 10/27/2018 17:11 10/27/2018 21:40 10/28/2018 07:51 10/28/2018 11:34  Glucose-Capillary Latest Ref Range: 70 - 99 mg/dL 303 (H) 451 (H) 444 (H) 406 (H) 193 (H) 212 (H)   Review of Glycemic Control  Diabetes history: DM2 Outpatient Diabetes medications: Glipizide 5 am QAM, Tresiba 50 mg QHS Current orders for Inpatient glycemic control: Lantus 50 units daily, Novolog 0-20 units TID with meals, Novolog 0-5 units QHS, Novolog 3 units TID with meals; Prednisone 40 mg QAM  Inpatient Diabetes Program Recommendations:   Insulin-Meal Coverage: If steroids are continued, please consider increasing meal coverage to Novolog 6 units TID with meals if patient eats at least 50% of meals.  Thanks, Barnie Alderman, RN, MSN, CDE Diabetes Coordinator Inpatient Diabetes Program [phone] (Team Pager from 8am to 5pm)

## 2018-10-28 NOTE — Progress Notes (Signed)
Subjective: She says she feels okay.  Her breathing is doing a little better.  She has no new complaints.  She is not coughing much.  Her blood sugar has been up and I have changed her sliding scale.  Objective: Vital signs in last 24 hours: Temp:  [98.2 F (36.8 C)] 98.2 F (36.8 C) (08/30 2141) Pulse Rate:  [65-81] 65 (08/31 0600) Resp:  [15-18] 18 (08/31 0600) BP: (106-163)/(52-75) 163/75 (08/31 0600) SpO2:  [95 %-100 %] 97 % (08/31 0600) Weight change:  Last BM Date: 10/25/18  Intake/Output from previous day: 08/30 0701 - 08/31 0700 In: 1080.6 [P.O.:480; I.V.:250.2; IV Piggyback:350.4] Out: 300 [Urine:300]  PHYSICAL EXAM General appearance: alert, cooperative and no distress Resp: rhonchi bilaterally Cardio: regular rate and rhythm, S1, S2 normal, no murmur, click, rub or gallop GI: soft, non-tender; bowel sounds normal; no masses,  no organomegaly Extremities: extremities normal, atraumatic, no cyanosis or edema  Lab Results:  Results for orders placed or performed during the hospital encounter of 10/26/18 (from the past 48 hour(s))  Lactic acid, plasma     Status: None   Collection Time: 10/26/18 10:01 AM  Result Value Ref Range   Lactic Acid, Venous 1.7 0.5 - 1.9 mmol/L    Comment: Performed at [facility], [address], [place], [place] [address]  CBG monitoring, ED     Status: Abnormal   Collection Time: 10/26/18 12:47 PM  Result Value Ref Range   Glucose-Capillary 198 (H) 70 - 99 mg/dL  Glucose, capillary     Status: Abnormal   Collection Time: 10/26/18  5:05 PM  Result Value Ref Range   Glucose-Capillary 252 (H) 70 - 99 mg/dL  Glucose, capillary     Status: Abnormal   Collection Time: 10/26/18 10:13 PM  Result Value Ref Range   Glucose-Capillary 332 (H) 70 - 99 mg/dL  MRSA PCR Screening     Status: Abnormal   Collection Time: 10/27/18  5:31 AM   Specimen: Nasal Mucosa; Nasopharyngeal  Result Value Ref Range   MRSA by PCR POSITIVE (A) NEGATIVE     Comment:        The GeneXpert MRSA Assay (FDA approved for NASAL specimens only), is one component of a comprehensive MRSA colonization surveillance program. It is not intended to diagnose MRSA infection nor to guide or monitor treatment for MRSA infections. RESULT CALLED TO, READ BACK BY AND VERIFIED WITH: Terance Ice @1026   08/30/2020KAY Performed at [facility], [address] [place], [place] [address]   Basic metabolic panel     Status: Abnormal   Collection Time: 10/27/18  7:21 AM  Result Value Ref Range   Sodium 138 135 - 145 mmol/L   Potassium 4.7 3.5 - 5.1 mmol/L    Comment: DELTA CHECK NOTED   Chloride 101 98 - 111 mmol/L   CO2 29 22 - 32 mmol/L   Glucose, Bld 345 (H) 70 - 99 mg/dL   BUN 33 (H) 8 - 23 mg/dL   Creatinine, Ser 1.99 (H) 0.44 - 1.00 mg/dL   Calcium 8.3 (L) 8.9 - 10.3 mg/dL   GFR calc non Af Amer 23 (L) >60 mL/min   GFR calc Af Amer 26 (L) >60 mL/min   Anion gap 8 5 - 15    Comment: Performed at [facility], [address] [place], [place] [address]  CBC     Status: Abnormal   Collection Time: 10/27/18  7:21 AM  Result Value Ref Range   WBC 2.5 (  L) 4.0 - 10.5 K/uL   RBC 2.69 (L) 3.87 - 5.11 MIL/uL   Hemoglobin 10.8 (L) 12.0 - 15.0 g/dL   HCT 31.7 (L) 36.0 - 46.0 %   MCV 117.8 (H) 80.0 - 100.0 fL   MCH 40.1 (H) 26.0 - 34.0 pg   MCHC 34.1 30.0 - 36.0 g/dL   RDW 15.2 11.5 - 15.5 %   Platelets 77 (L) 150 - 400 K/uL    Comment: REPEATED TO VERIFY SPECIMEN CHECKED FOR CLOTS Immature Platelet Fraction may be clinically indicated, consider ordering this additional test [id number] CONSISTENT WITH PREVIOUS RESULT    nRBC 0.0 0.0 - 0.2 %    Comment: Performed at [facility], [address], [place], [place] [address]  Glucose, capillary     Status: Abnormal   Collection Time: 10/27/18  7:34 AM  Result Value Ref Range   Glucose-Capillary 303 (H) 70 - 99 mg/dL   Comment 1 Notify RN    Comment 2 Document in Chart   Glucose, capillary     Status:  Abnormal   Collection Time: 10/27/18 11:27 AM  Result Value Ref Range   Glucose-Capillary 451 (H) 70 - 99 mg/dL  Glucose, capillary     Status: Abnormal   Collection Time: 10/27/18  5:11 PM  Result Value Ref Range   Glucose-Capillary 444 (H) 70 - 99 mg/dL  Glucose, capillary     Status: Abnormal   Collection Time: 10/27/18  9:40 PM  Result Value Ref Range   Glucose-Capillary 406 (H) 70 - 99 mg/dL  Glucose, capillary     Status: Abnormal   Collection Time: 10/28/18  7:51 AM  Result Value Ref Range   Glucose-Capillary 193 (H) 70 - 99 mg/dL    ABGS No results for input(s): PHART, PO2ART, TCO2, HCO3 in the last 72 hours.  Invalid input(s): PCO2 CULTURES Recent Results (from the past 240 hour(s))  Urine culture     Status: Abnormal   Collection Time: 10/26/18  5:32 AM   Specimen: Urine, Clean Catch  Result Value Ref Range Status   Specimen Description   Final    URINE, CLEAN CATCH Performed at [facility], [address] [place], [place] [address]    Special Requests   Final    NONE Performed at [facility], [address], [place], [place] [address]    [place], SUGGEST RECOLLECTION (A)  Final   Report Status 10/27/2018 FINAL  Final  SARS Coronavirus 2 [facility] order, Performed in [facility] hospital lab) Nasopharyngeal Nasopharyngeal Swab     Status: None   Collection Time: 10/26/18  6:51 AM   Specimen: Nasopharyngeal Swab  Result Value Ref Range Status   SARS Coronavirus 2 NEGATIVE NEGATIVE Final    Comment: (NOTE) If result is NEGATIVE SARS-CoV-2 target nucleic acids are NOT DETECTED. The SARS-CoV-2 RNA is generally detectable in upper and lower  respiratory specimens during the acute phase of infection. The lowest  concentration of SARS-CoV-2 viral copies this assay can detect is 250  copies / mL. A negative result does not preclude SARS-CoV-2 infection  and should not be used as the sole basis for treatment or other  patient management  decisions.  A negative result may occur with  improper specimen collection / handling, submission of specimen other  than nasopharyngeal swab, presence of viral mutation(s) within the  areas targeted by this assay, and inadequate number of viral copies  (<250 copies / mL). A negative result must be combined  with clinical  observations, patient history, and epidemiological information. If result is POSITIVE SARS-CoV-2 target nucleic acids are DETECTED. The SARS-CoV-2 RNA is generally detectable in upper and lower  respiratory specimens dur ing the acute phase of infection.  Positive  results are indicative of active infection with SARS-CoV-2.  Clinical  correlation with patient history and other diagnostic information is  necessary to determine patient infection status.  Positive results do  not rule out bacterial infection or co-infection with other viruses. If result is PRESUMPTIVE POSTIVE SARS-CoV-2 nucleic acids MAY BE PRESENT.   A presumptive positive result was obtained on the submitted specimen  and confirmed on repeat testing.  While 2019 novel coronavirus  (SARS-CoV-2) nucleic acids may be present in the submitted sample  additional confirmatory testing may be necessary for epidemiological  and / or clinical management purposes  to differentiate between  SARS-CoV-2 and other Sarbecovirus currently known to infect humans.  If clinically indicated additional testing with an alternate test  methodology [phone]) is advised. The SARS-CoV-2 RNA is generally  detectable in upper and lower respiratory sp ecimens during the acute  phase of infection. The expected result is Negative. Fact Sheet for Patients:  StrictlyIdeas.no Fact Sheet for Healthcare Providers: BankingDealers.co.za This test is not yet approved or cleared by the [place] FDA and has been authorized for detection and/or diagnosis of SARS-CoV-2 by FDA under an  Emergency Use Authorization (EUA).  This EUA will remain in effect (meaning this test can be used) for the duration of the COVID-19 declaration under Section 564(b)(1) of the Act, 21 U.S.C. section 360bbb-3(b)(1), unless the authorization is terminated or revoked sooner. Performed at [facility], [address] [place], [place] [address]   Blood Culture (routine x 2)     Status: None (Preliminary result)   Collection Time: 10/26/18  7:57 AM   Specimen: BLOOD  Result Value Ref Range Status   Specimen Description BLOOD  Final   Special Requests NONE  Final   Culture   Final    NO GROWTH 1 DAY Performed at [facility], [address], [place], [place] [address]    Report Status PENDING  Incomplete  Blood Culture (routine x 2)     Status: None (Preliminary result)   Collection Time: 10/26/18  7:59 AM   Specimen: BLOOD  Result Value Ref Range Status   Specimen Description BLOOD  Final   Special Requests NONE  Final   Culture   Final    NO GROWTH 1 DAY Performed at [name], [address], [place], [place] [address]    Report Status PENDING  Incomplete  MRSA PCR Screening     Status: Abnormal   Collection Time: 10/27/18  5:31 AM   Specimen: Nasal Mucosa; Nasopharyngeal  Result Value Ref Range Status   MRSA by PCR POSITIVE (A) NEGATIVE Final    Comment:        The GeneXpert MRSA Assay (FDA approved for NASAL specimens only), is one component of a comprehensive MRSA colonization surveillance program. It is not intended to diagnose MRSA infection nor to guide or monitor treatment for MRSA infections. RESULT CALLED TO, READ BACK BY AND VERIFIED WITH: Terance Ice @1026   08/30/2020KAY Performed at [facility], [address] [place], [place] [address]    Studies/Results: Ct Abdomen Pelvis Wo Contrast  Result Date: 10/26/2018 CLINICAL DATA:  Fever. Right-sided flank pain. Altered mental status. History of left-sided breast cancer. EXAM: CT CHEST, ABDOMEN AND PELVIS  WITHOUT CONTRAST TECHNIQUE:  Multidetector CT imaging of the chest, abdomen and pelvis was performed following the standard protocol without IV contrast. COMPARISON:  CT chest dated April 11, 2018. Abdominal ultrasound dated June 09, 2014. CT abdomen pelvis dated April 10, 2011. FINDINGS: CT CHEST FINDINGS Cardiovascular: Normal heart size. No pericardial effusion. No thoracic aortic aneurysm. Coronary, aortic arch, and branch vessel atherosclerotic vascular disease. Mediastinum/Nodes: No enlarged mediastinal, hilar, or axillary lymph nodes. Thyroid gland, trachea, and esophagus demonstrate no significant findings. Lungs/Pleura: There are few scattered small faint ground-glass densities in the right upper lobe (series 3, image 39) and left upper lobe (series 3, image 59). Scattered subsegmental atelectasis and scarring. No focal consolidation, pleural effusion, or pneumothorax. Musculoskeletal: Unchanged left breast skin thickening and retroareolar soft tissue fullness. No acute or significant osseous findings. Chronic T12 compression deformity. CT ABDOMEN PELVIS FINDINGS Hepatobiliary: Unchanged cirrhosis. No focal liver abnormality. Prior cholecystectomy. No biliary dilatation. Pancreas: Unremarkable. No pancreatic ductal dilatation or surrounding inflammatory changes. Spleen: Unchanged splenomegaly and 1.7 cm low-density lesion in the anterior spleen. Adrenals/Urinary Tract: The adrenal glands are unremarkable. Unchanged left renal atrophy. No renal calculi or hydronephrosis. The bladder is unremarkable. Stomach/Bowel: Stomach is within normal limits. Appendix appears normal. No evidence of bowel wall thickening, distention, or inflammatory changes. Moderate left-sided colonic diverticulosis. Vascular/Lymphatic: Minimal aortic atherosclerosis. No enlarged abdominal or pelvic lymph nodes. Reproductive: Status post hysterectomy. No adnexal masses. Other: No abdominal wall hernia or abnormality. Trace  perihepatic ascites. No pneumoperitoneum. Musculoskeletal: No acute or significant osseous findings. Prior lumbar decompression and fusion. Chronic L1 and L4 compression deformities. Progressive severe spondylosis at T12-L1. IMPRESSION: Chest: 1. A few scattered small faint ground-glass densities in both upper lobes may be infectious or inflammatory. No consolidative pneumonia. 2. Unchanged left breast skin thickening and retro soft tissue fullness, nonspecific in the setting of prior radiation. 3.  Aortic atherosclerosis (ICD10-I70.0). Abdomen and pelvis: 1.  No acute intra-abdominal process. 2. Unchanged cirrhosis and splenomegaly. Electronically Signed   By: Titus Dubin M.D.   On: 10/26/2018 10:34   Ct Chest Wo Contrast  Result Date: 10/26/2018 CLINICAL DATA:  Fever. Right-sided flank pain. Altered mental status. History of left-sided breast cancer. EXAM: CT CHEST, ABDOMEN AND PELVIS WITHOUT CONTRAST TECHNIQUE: Multidetector CT imaging of the chest, abdomen and pelvis was performed following the standard protocol without IV contrast. COMPARISON:  CT chest dated April 11, 2018. Abdominal ultrasound dated June 09, 2014. CT abdomen pelvis dated April 10, 2011. FINDINGS: CT CHEST FINDINGS Cardiovascular: Normal heart size. No pericardial effusion. No thoracic aortic aneurysm. Coronary, aortic arch, and branch vessel atherosclerotic vascular disease. Mediastinum/Nodes: No enlarged mediastinal, hilar, or axillary lymph nodes. Thyroid gland, trachea, and esophagus demonstrate no significant findings. Lungs/Pleura: There are few scattered small faint ground-glass densities in the right upper lobe (series 3, image 39) and left upper lobe (series 3, image 59). Scattered subsegmental atelectasis and scarring. No focal consolidation, pleural effusion, or pneumothorax. Musculoskeletal: Unchanged left breast skin thickening and retroareolar soft tissue fullness. No acute or significant osseous findings. Chronic  T12 compression deformity. CT ABDOMEN PELVIS FINDINGS Hepatobiliary: Unchanged cirrhosis. No focal liver abnormality. Prior cholecystectomy. No biliary dilatation. Pancreas: Unremarkable. No pancreatic ductal dilatation or surrounding inflammatory changes. Spleen: Unchanged splenomegaly and 1.7 cm low-density lesion in the anterior spleen. Adrenals/Urinary Tract: The adrenal glands are unremarkable. Unchanged left renal atrophy. No renal calculi or hydronephrosis. The bladder is unremarkable. Stomach/Bowel: Stomach is within normal limits. Appendix appears normal. No evidence of bowel wall thickening, distention, or inflammatory changes.  Moderate left-sided colonic diverticulosis. Vascular/Lymphatic: Minimal aortic atherosclerosis. No enlarged abdominal or pelvic lymph nodes. Reproductive: Status post hysterectomy. No adnexal masses. Other: No abdominal wall hernia or abnormality. Trace perihepatic ascites. No pneumoperitoneum. Musculoskeletal: No acute or significant osseous findings. Prior lumbar decompression and fusion. Chronic L1 and L4 compression deformities. Progressive severe spondylosis at T12-L1. IMPRESSION: Chest: 1. A few scattered small faint ground-glass densities in both upper lobes may be infectious or inflammatory. No consolidative pneumonia. 2. Unchanged left breast skin thickening and retro soft tissue fullness, nonspecific in the setting of prior radiation. 3.  Aortic atherosclerosis (ICD10-I70.0). Abdomen and pelvis: 1.  No acute intra-abdominal process. 2. Unchanged cirrhosis and splenomegaly. Electronically Signed   By: Titus Dubin M.D.   On: 10/26/2018 10:34    Medications:  Prior to Admission:  Medications Prior to Admission  Medication Sig Dispense Refill Last Dose  . acyclovir (ZOVIRAX) 400 MG tablet Take 1 tablet (400 mg total) by mouth 2 (two) times daily. 120 tablet 3 10/25/2018 at Unknown time  . ALPHAGAN P 0.1 % SOLN Place 1 drop into both eyes 2 (two) times daily.     10/25/2018 at Unknown time  . amLODipine (NORVASC) 5 MG tablet Take 5 mg by mouth every morning.    10/25/2018 at Unknown time  . aspirin EC 81 MG tablet Take 81 mg by mouth daily.   10/25/2018 at 1000  . atorvastatin (LIPITOR) 10 MG tablet Take 10 mg by mouth every evening.    10/25/2018 at Unknown time  . Bacillus Coagulans-Inulin (PROBIOTIC FORMULA PO) Take 1 capsule by mouth daily.   10/25/2018 at Unknown time  . BD PEN NEEDLE NANO U/F 32G X 4 MM MISC      . cyanocobalamin (,VITAMIN B-12,) 1000 MCG/ML injection Inject 1,000 mcg into the muscle every 30 (thirty) days.    Past Month at Unknown time  . dorzolamide (TRUSOPT) 2 % ophthalmic solution Place 1 drop into both eyes 2 (two) times daily.   4 10/25/2018 at Unknown time  . EPIPEN 2-PAK 0.3 MG/0.3ML SOAJ injection Inject 0.3 mg into the muscle as needed for anaphylaxis.      . fulvestrant (FASLODEX) 250 MG/5ML injection Inject 500 mg into the muscle every 14 (fourteen) days. One injection each buttock over 1-2 minutes. Warm prior to use.     . gabapentin (NEURONTIN) 600 MG tablet Take 600 mg by mouth daily.    10/25/2018 at Unknown time  . glipiZIDE (GLUCOTROL) 5 MG tablet Take 5 mg by mouth daily before breakfast.    10/25/2018 at Unknown time  . guaiFENesin-dextromethorphan (ROBITUSSIN DM) 100-10 MG/5ML syrup Take 5 mLs by mouth every 4 (four) hours as needed for cough. 118 mL 0   . ipratropium-albuterol (DUONEB) 0.5-2.5 (3) MG/3ML SOLN Take 3 mLs by nebulization 3 (three) times daily. 360 mL 3 10/25/2018 at Unknown time  . Lancets (ONETOUCH DELICA PLUS [id number]) MISC      . latanoprost (XALATAN) 0.005 % ophthalmic solution Place 1 drop into both eyes at bedtime.    10/25/2018 at Unknown time  . methocarbamol (ROBAXIN) 500 MG tablet Take 500 mg by mouth 2 (two) times daily as needed for muscle spasms.   Past Week at Unknown time  . metoprolol succinate (TOPROL-XL) 100 MG 24 hr tablet Take 100 mg by mouth every morning. Take with or immediately  following a meal.   10/25/2018 at 100  . mirtazapine (REMERON) 15 MG tablet Take 15 mg by mouth at bedtime.  10/25/2018 at Unknown time  . MYRBETRIQ 50 MG TB24 tablet Take 50 mg by mouth daily.    10/25/2018 at Unknown time  . omeprazole (PRILOSEC) 20 MG capsule Take 20 mg by mouth daily.   10/25/2018 at Unknown time  . ONETOUCH ULTRA test strip      . palbociclib (IBRANCE) 75 MG tablet Take 1 tablet (75 mg total) by mouth daily. Take for 21 days on, 7 days off, repeat every 28 days. 21 tablet 6 10/25/2018 at Unknown time  . Polyethyl Glycol-Propyl Glycol (SYSTANE ULTRA) 0.4-0.3 % SOLN Place 1-2 drops into both eyes daily as needed (for itching eyes).   10/25/2018 at Unknown time  . tamoxifen (NOLVADEX) 20 MG tablet Take 1 tablet (20 mg total) by mouth daily. 90 tablet 12 10/25/2018 at Unknown time  . torsemide (DEMADEX) 20 MG tablet Take 1 tablet (20 mg total) by mouth daily. 30 tablet 5 10/25/2018 at Unknown time  . TRESIBA FLEXTOUCH 100 UNIT/ML SOPN FlexTouch Pen Inject 0.5 mLs (50 Units total) into the skin daily. 5 pen 5 10/25/2018 at Unknown time   Scheduled: . acyclovir  400 mg Oral BID  . amLODipine  5 mg Oral q morning - 10a  . aspirin EC  81 mg Oral Q breakfast  . atorvastatin  10 mg Oral QPM  . brimonidine  1 drop Both Eyes BID  . Chlorhexidine Gluconate Cloth  6 each Topical Q0600  . [START ON 11/11/2018] cyanocobalamin  1,000 mcg Intramuscular Q30 days  . dorzolamide  1 drop Both Eyes BID  . gabapentin  600 mg Oral Daily  . heparin  5,000 Units Subcutaneous Q8H  . insulin aspart  0-20 Units Subcutaneous TID WC  . insulin aspart  0-5 Units Subcutaneous QHS  . insulin aspart  3 Units Subcutaneous TID WC  . insulin glargine  50 Units Subcutaneous Daily  . ipratropium-albuterol  3 mL Nebulization TID  . latanoprost  1 drop Both Eyes QHS  . mouth rinse  15 mL Mouth Rinse BID  . metoprolol succinate  100 mg Oral QPC breakfast  . mirabegron ER  25 mg Oral Daily  . mirtazapine  15 mg Oral  QHS  . mupirocin ointment  1 application Nasal BID  . palbociclib  75 mg Oral Daily  . pantoprazole  40 mg Oral Daily  . predniSONE  40 mg Oral Q breakfast  . saccharomyces boulardii  250 mg Oral Daily  . sodium chloride flush  3 mL Intravenous Q12H  . tamoxifen  20 mg Oral Daily  . torsemide  20 mg Oral Daily   Continuous: . sodium chloride 50 mL/hr at 10/28/18 0353  . sodium chloride    . azithromycin 500 mg (10/27/18 1005)  . cefTRIAXone (ROCEPHIN)  IV 2 g (10/27/18 1210)   BJS:EGBTDV chloride, acetaminophen **OR** acetaminophen, albuterol, guaiFENesin-dextromethorphan, hydrALAZINE, methocarbamol, ondansetron **OR** ondansetron (ZOFRAN) IV, polyethylene glycol, polyvinyl alcohol, sodium chloride flush  Assesment: She was admitted with acute hypoxic respiratory failure related to community-acquired pneumonia.  She has some element of COPD mostly chronic bronchitis.  She says she feels better.  Her blood sugar has been elevated and I have adjusted her sliding scale and added mealtime insulin  She had acute metabolic encephalopathy related to her acute illness and that is better  She has hypertension which is pretty well controlled Principal Problem:   Acute Respiratory failure with Hypoxia ([place]) Active Problems:   HTN (hypertension)   DM type 2 (diabetes mellitus, type 2) ([place])  Hyperlipidemia   Hypertension   Acute Metabolic Encephalopathy acute   Acute hypoxemic respiratory failure ([facility])   Fever   Community acquired pneumonia    Plan: Continue treatments.  Occupational Therapy consultation.  I adjusted her insulin.    LOS: 1 day   Alonza Bogus 10/28/2018, 8:28 AM

## 2018-10-29 LAB — GLUCOSE, CAPILLARY
Glucose-Capillary: 131 mg/dL — ABNORMAL HIGH (ref 70–99)
Glucose-Capillary: 234 mg/dL — ABNORMAL HIGH (ref 70–99)
Glucose-Capillary: 217 mg/dL — ABNORMAL HIGH (ref 70–99)
Glucose-Capillary: 284 mg/dL — ABNORMAL HIGH (ref 70–99)

## 2018-10-29 MED ORDER — INSULIN ASPART 100 UNIT/ML ~~LOC~~ SOLN
6.0000 [IU] | Freq: Three times a day (TID) | SUBCUTANEOUS | Status: DC
  Administered 2018-10-29 – 2018-10-31 (×7): 6 [IU] via SUBCUTANEOUS

## 2018-10-29 NOTE — Progress Notes (Signed)
Inpatient Diabetes Program Recommendations  AACE/ADA: New Consensus Statement on Inpatient Glycemic Control (2015)  Target Ranges:  Prepandial:   less than 140 mg/dL      Peak postprandial:   less than 180 mg/dL (1-2 hours)      Critically ill patients:  140 - 180 mg/dL   Lab Results  Component Value Date   GLUCAP 234 (H) 10/29/2018   HGBA1C 10.2 (H) 10/27/2018    Review of Glycemic Control Results for [name], [name] (MRN [id number]) as of 10/29/2018 14:42  Ref. Range 10/28/2018 07:51 10/28/2018 11:34 10/28/2018 16:52 10/28/2018 21:41 10/29/2018 07:41 10/29/2018 11:28  Glucose-Capillary Latest Ref Range: 70 - 99 mg/dL 193 (H) 212 (H) 218 (H) 277 (H) 131 (H) 234 (H)   Diabetes history: DM2 Outpatient Diabetes medications: Tresiba 60 units qd + Glipizide 5 mg qd Current orders for Inpatient glycemic control: Lantus 50 units qd + Novolog 5 units tid meal coverage + Novolog resistant correction tid + hs 0-5 units  Inpatient Diabetes Program Recommendations:   Spoke with pt about A1C results 10.2 (average blood glucose 246) with them and explained what an A1C is, basic pathophysiology of DM Type 2, basic home care, basic diabetes diet nutrition principles, importance of checking CBGs and maintaining good CBG control to prevent long-term and short-term complications. Reviewed signs and symptoms of hyperglycemia and hypoglycemia and how to treat hypoglycemia at home. Also reviewed blood sugar goals at home.  Patient states she is taking her medications as prescribed, drinks only water, but likes to eat bread. Patient states she plans to check her blood glucose more often and take list of CBGs or meter with her to Dr. Luan Pulling office visit.  Thank you, Nani Gasser. Simmie Camerer, RN, MSN, CDE  Diabetes Coordinator Inpatient Glycemic Control Team Team Pager [phone] (8am-5pm) 10/29/2018 3:03 PM

## 2018-10-29 NOTE — Evaluation (Signed)
Occupational Therapy Evaluation Patient Details Name: [name] MRN: [id number] DOB: 21-Aug-1933 Today's Date: 10/29/2018    History of Present Illness [name]  is a 85 y.o. female with recurrent left breast invasive ductal carcinoma,  status post left lumpectomy, h/o DM2, HLD, HTN who presented to the ED with concerns about fevers up to 101, confusion and disorientation. She was admitted with acute hypoxic respiratory failure. She has community-acquired pneumonia   Clinical Impression   Pt received in bed, agreeable to OT evaluation. Pt is performing ADLs at supervision level, assistance for managing multiple lines this am. Pt appears to be at baseline with ADL completion, limited in standing tolerance due to fatigue. No further OT services required at this time, pt will have 24/7 supervision and assistance at home from family on discharge.     Follow Up Recommendations  No OT follow up;Supervision/Assistance - 24 hour          Precautions / Restrictions Precautions Precautions: Fall Restrictions Weight Bearing Restrictions: No      Mobility Bed Mobility Overal bed mobility: Needs Assistance Bed Mobility: Supine to Sit;Sit to Supine     Supine to sit: Supervision;HOB elevated Sit to supine: HOB elevated;Supervision      Transfers Overall transfer level: Needs assistance Equipment used: Rolling walker (2 wheeled) Transfers: Sit to/from [facility] Sit to Stand: Supervision Stand pivot transfers: Supervision                ADL either performed or assessed with clinical judgement   ADL Overall ADL's : Needs assistance/impaired Eating/Feeding: Modified independent;Sitting   Grooming: Set up;Sitting           Upper Body Dressing : Modified independent;Sitting   Lower Body Dressing: Supervision/safety;Sitting/lateral leans   Toilet Transfer: Supervision/safety;Stand-pivot;RW Armed forces technical officer Details (indicate cue type and reason):  simulated with bed to chair transfer         Functional mobility during ADLs: Supervision/safety;Rolling walker       Vision Baseline Vision/History: Wears glasses Wears Glasses: At all times Patient Visual Report: No change from baseline Vision Assessment?: No apparent visual deficits            Pertinent Vitals/Pain Pain Assessment: No/denies pain     Hand Dominance Right   Extremity/Trunk Assessment Upper Extremity Assessment Upper Extremity Assessment: Overall WFL for tasks assessed(Slight shoulder weakness due to past RC repairs)   Lower Extremity Assessment Lower Extremity Assessment: Defer to PT evaluation   Cervical / Trunk Assessment Cervical / Trunk Assessment: Normal   Communication Communication Communication: No difficulties   Cognition Arousal/Alertness: Awake/alert Behavior During Therapy: WFL for tasks assessed/performed Overall Cognitive Status: Within Functional Limits for tasks assessed                                                Home Living Family/patient expects to be discharged to:: Private residence Living Arrangements: Children(daughter and son-in-law) Available Help at Discharge: Family Type of Home: House Home Access: Stairs to enter Technical brewer of Steps: 4 at her daughters and 2 stairs at her home Entrance Stairs-Rails: Right;Can reach both(2 handrails at her daughters) Home Layout: One level     Bathroom Shower/Tub: Teacher, early years/pre: Handicapped height Bathroom Accessibility: Yes(pt goes to her home for bathing)   Home Equipment: Cane - single point;Shower seat;Hand held Tourist information centre manager -  4 wheels;Grab bars - tub/shower;Grab bars - toilet   Additional Comments: pt's daughter or son-in-law take her to her home throughout the week to shower in her own bathroom as that is where the shower seat is, she lives/stays at her daughters house currently and sleeps in a recliner       Prior Functioning/Environment Level of Independence: Needs assistance  Gait / Transfers Assistance Needed: household ambulator with Rollator ADL's / Homemaking Assistance Needed: assisted by daughter as needed for ADLs            OT Problem List: Decreased strength;Decreased activity tolerance       End of Session Equipment Utilized During Treatment: Gait belt;Rolling walker;Oxygen  Activity Tolerance: Patient tolerated treatment well Patient left: in chair;with call bell/phone within reach;with chair alarm set;Other (comment)(RT in room)  OT Visit Diagnosis: Muscle weakness (generalized) (M62.81)                Time: 4847-2072 OT Time Calculation (min): 12 min Charges:  OT General Charges $OT Visit: 1 Visit OT Evaluation $OT Eval Low Complexity: [place], OTR/L  [phone] 10/29/2018, 8:39 AM

## 2018-10-29 NOTE — Progress Notes (Signed)
Subjective: She says she is feeling better.  She has no new complaints.  Her breathing is improving.  She has some ankle pain last night.  This is an ankle that she had previously fractured but it resolved with Tylenol  Objective: Vital signs in last 24 hours: Temp:  [97.3 F (36.3 C)-98.1 F (36.7 C)] 97.3 F (36.3 C) (09/01 0626) Pulse Rate:  [64-81] 64 (09/01 0626) Resp:  [18-20] 20 (09/01 0626) BP: (131-141)/(68-73) 141/73 (09/01 0626) SpO2:  [92 %-99 %] 97 % (09/01 0810) Weight change:  Last BM Date: 10/28/18  Intake/Output from previous day: 08/31 0701 - 09/01 0700 In: 1511.5 [I.V.:1161.5; IV Piggyback:350] Out: -   PHYSICAL EXAM General appearance: alert, cooperative and no distress Resp: rhonchi bilaterally Cardio: regular rate and rhythm, S1, S2 normal, no murmur, click, rub or gallop GI: soft, non-tender; bowel sounds normal; no masses,  no organomegaly Extremities: extremities normal, atraumatic, no cyanosis or edema  Lab Results:  Results for orders placed or performed during the hospital encounter of 10/26/18 (from the past 48 hour(s))  Glucose, capillary     Status: Abnormal   Collection Time: 10/27/18 11:27 AM  Result Value Ref Range   Glucose-Capillary 451 (H) 70 - 99 mg/dL  Glucose, capillary     Status: Abnormal   Collection Time: 10/27/18  5:11 PM  Result Value Ref Range   Glucose-Capillary 444 (H) 70 - 99 mg/dL  Glucose, capillary     Status: Abnormal   Collection Time: 10/27/18  9:40 PM  Result Value Ref Range   Glucose-Capillary 406 (H) 70 - 99 mg/dL  Glucose, capillary     Status: Abnormal   Collection Time: 10/28/18  7:51 AM  Result Value Ref Range   Glucose-Capillary 193 (H) 70 - 99 mg/dL  Glucose, capillary     Status: Abnormal   Collection Time: 10/28/18 11:34 AM  Result Value Ref Range   Glucose-Capillary 212 (H) 70 - 99 mg/dL   Comment 1 Document in Chart   Glucose, capillary     Status: Abnormal   Collection Time: 10/28/18  4:52 PM   Result Value Ref Range   Glucose-Capillary 218 (H) 70 - 99 mg/dL  Glucose, capillary     Status: Abnormal   Collection Time: 10/28/18  9:41 PM  Result Value Ref Range   Glucose-Capillary 277 (H) 70 - 99 mg/dL  Glucose, capillary     Status: Abnormal   Collection Time: 10/29/18  7:41 AM  Result Value Ref Range   Glucose-Capillary 131 (H) 70 - 99 mg/dL    ABGS No results for input(s): PHART, PO2ART, TCO2, HCO3 in the last 72 hours.  Invalid input(s): PCO2 CULTURES Recent Results (from the past 240 hour(s))  Urine culture     Status: Abnormal   Collection Time: 10/26/18  5:32 AM   Specimen: Urine, Clean Catch  Result Value Ref Range Status   Specimen Description   Final    URINE, CLEAN CATCH Performed at [facility], [address], [place], [place] [address]    Special Requests   Final    NONE Performed at [facility], [address], [place], [place] [address]    Culture MULTIPLE SPECIES PRESENT, SUGGEST RECOLLECTION (A)  Final   Report Status 10/27/2018 FINAL  Final  SARS Coronavirus 2 [facility] order, Performed in [facility] hospital lab) Nasopharyngeal Nasopharyngeal Swab     Status: None   Collection Time: 10/26/18  6:51 AM   Specimen: Nasopharyngeal Swab  Result Value  Ref Range Status   SARS Coronavirus 2 NEGATIVE NEGATIVE Final    Comment: (NOTE) If result is NEGATIVE SARS-CoV-2 target nucleic acids are NOT DETECTED. The SARS-CoV-2 RNA is generally detectable in upper and lower  respiratory specimens during the acute phase of infection. The lowest  concentration of SARS-CoV-2 viral copies this assay can detect is 250  copies / mL. A negative result does not preclude SARS-CoV-2 infection  and should not be used as the sole basis for treatment or other  patient management decisions.  A negative result may occur with  improper specimen collection / handling, submission of specimen other  than nasopharyngeal swab, presence of viral mutation(s) within the  areas  targeted by this assay, and inadequate number of viral copies  (<250 copies / mL). A negative result must be combined with clinical  observations, patient history, and epidemiological information. If result is POSITIVE SARS-CoV-2 target nucleic acids are DETECTED. The SARS-CoV-2 RNA is generally detectable in upper and lower  respiratory specimens dur ing the acute phase of infection.  Positive  results are indicative of active infection with SARS-CoV-2.  Clinical  correlation with patient history and other diagnostic information is  necessary to determine patient infection status.  Positive results do  not rule out bacterial infection or co-infection with other viruses. If result is PRESUMPTIVE POSTIVE SARS-CoV-2 nucleic acids MAY BE PRESENT.   A presumptive positive result was obtained on the submitted specimen  and confirmed on repeat testing.  While 2019 novel coronavirus  (SARS-CoV-2) nucleic acids may be present in the submitted sample  additional confirmatory testing may be necessary for epidemiological  and / or clinical management purposes  to differentiate between  SARS-CoV-2 and other Sarbecovirus currently known to infect humans.  If clinically indicated additional testing with an alternate test  methodology [phone]) is advised. The SARS-CoV-2 RNA is generally  detectable in upper and lower respiratory sp ecimens during the acute  phase of infection. The expected result is Negative. Fact Sheet for Patients:  StrictlyIdeas.no Fact Sheet for Healthcare Providers: BankingDealers.co.za This test is not yet approved or cleared by the [place] FDA and has been authorized for detection and/or diagnosis of SARS-CoV-2 by FDA under an Emergency Use Authorization (EUA).  This EUA will remain in effect (meaning this test can be used) for the duration of the COVID-19 declaration under Section 564(b)(1) of the Act, 21 U.S.C. section  360bbb-3(b)(1), unless the authorization is terminated or revoked sooner. Performed at [facility], [address], [place], [place] [address]   Blood Culture (routine x 2)     Status: None (Preliminary result)   Collection Time: 10/26/18  7:57 AM   Specimen: BLOOD  Result Value Ref Range Status   Specimen Description BLOOD  Final   Special Requests NONE  Final   Culture   Final    NO GROWTH 3 DAYS Performed at [facility], [address] [place], [place] [address]    Report Status PENDING  Incomplete  Blood Culture (routine x 2)     Status: None (Preliminary result)   Collection Time: 10/26/18  7:59 AM   Specimen: BLOOD  Result Value Ref Range Status   Specimen Description BLOOD  Final   Special Requests NONE  Final   Culture   Final    NO GROWTH 3 DAYS Performed at [facility], [address], [place], [place] [address]    Report Status PENDING  Incomplete  MRSA PCR Screening     Status:  Abnormal   Collection Time: 10/27/18  5:31 AM   Specimen: Nasal Mucosa; Nasopharyngeal  Result Value Ref Range Status   MRSA by PCR POSITIVE (A) NEGATIVE Final    Comment:        The GeneXpert MRSA Assay (FDA approved for NASAL specimens only), is one component of a comprehensive MRSA colonization surveillance program. It is not intended to diagnose MRSA infection nor to guide or monitor treatment for MRSA infections. RESULT CALLED TO, READ BACK BY AND VERIFIED WITH: Terance Ice @1026   08/30/2020KAY Performed at [facility], [address], [place], [place] [address]    Studies/Results: No results found.  Medications:  Prior to Admission:  Medications Prior to Admission  Medication Sig Dispense Refill Last Dose  . acyclovir (ZOVIRAX) 400 MG tablet Take 1 tablet (400 mg total) by mouth 2 (two) times daily. 120 tablet 3 10/25/2018 at Unknown time  . ALPHAGAN P 0.1 % SOLN Place 1 drop into both eyes 2 (two) times daily.    10/25/2018 at Unknown time  . amLODipine (NORVASC) 5 MG  tablet Take 5 mg by mouth every morning.    10/25/2018 at Unknown time  . aspirin EC 81 MG tablet Take 81 mg by mouth daily.   10/25/2018 at 1000  . atorvastatin (LIPITOR) 10 MG tablet Take 10 mg by mouth every evening.    10/25/2018 at Unknown time  . Bacillus Coagulans-Inulin (PROBIOTIC FORMULA PO) Take 1 capsule by mouth daily.   10/25/2018 at Unknown time  . BD PEN NEEDLE NANO U/F 32G X 4 MM MISC      . cyanocobalamin (,VITAMIN B-12,) 1000 MCG/ML injection Inject 1,000 mcg into the muscle every 30 (thirty) days.    Past Month at Unknown time  . dorzolamide (TRUSOPT) 2 % ophthalmic solution Place 1 drop into both eyes 2 (two) times daily.   4 10/25/2018 at Unknown time  . EPIPEN 2-PAK 0.3 MG/0.3ML SOAJ injection Inject 0.3 mg into the muscle as needed for anaphylaxis.      . fulvestrant (FASLODEX) 250 MG/5ML injection Inject 500 mg into the muscle every 14 (fourteen) days. One injection each buttock over 1-2 minutes. Warm prior to use.     . gabapentin (NEURONTIN) 600 MG tablet Take 600 mg by mouth daily.    10/25/2018 at Unknown time  . glipiZIDE (GLUCOTROL) 5 MG tablet Take 5 mg by mouth daily before breakfast.    10/25/2018 at Unknown time  . guaiFENesin-dextromethorphan (ROBITUSSIN DM) 100-10 MG/5ML syrup Take 5 mLs by mouth every 4 (four) hours as needed for cough. 118 mL 0   . ipratropium-albuterol (DUONEB) 0.5-2.5 (3) MG/3ML SOLN Take 3 mLs by nebulization 3 (three) times daily. 360 mL 3 10/25/2018 at Unknown time  . Lancets (ONETOUCH DELICA PLUS [id number]) MISC      . latanoprost (XALATAN) 0.005 % ophthalmic solution Place 1 drop into both eyes at bedtime.    10/25/2018 at Unknown time  . methocarbamol (ROBAXIN) 500 MG tablet Take 500 mg by mouth 2 (two) times daily as needed for muscle spasms.   Past Week at Unknown time  . metoprolol succinate (TOPROL-XL) 100 MG 24 hr tablet Take 100 mg by mouth every morning. Take with or immediately following a meal.   10/25/2018 at 100  . mirtazapine (REMERON)  15 MG tablet Take 15 mg by mouth at bedtime.    10/25/2018 at Unknown time  . MYRBETRIQ 50 MG TB24 tablet Take 50 mg by mouth daily.    10/25/2018  at Unknown time  . omeprazole (PRILOSEC) 20 MG capsule Take 20 mg by mouth daily.   10/25/2018 at Unknown time  . ONETOUCH ULTRA test strip      . palbociclib (IBRANCE) 75 MG tablet Take 1 tablet (75 mg total) by mouth daily. Take for 21 days on, 7 days off, repeat every 28 days. 21 tablet 6 10/25/2018 at Unknown time  . Polyethyl Glycol-Propyl Glycol (SYSTANE ULTRA) 0.4-0.3 % SOLN Place 1-2 drops into both eyes daily as needed (for itching eyes).   10/25/2018 at Unknown time  . tamoxifen (NOLVADEX) 20 MG tablet Take 1 tablet (20 mg total) by mouth daily. 90 tablet 12 10/25/2018 at Unknown time  . torsemide (DEMADEX) 20 MG tablet Take 1 tablet (20 mg total) by mouth daily. 30 tablet 5 10/25/2018 at Unknown time  . TRESIBA FLEXTOUCH 100 UNIT/ML SOPN FlexTouch Pen Inject 0.5 mLs (50 Units total) into the skin daily. 5 pen 5 10/25/2018 at Unknown time   Scheduled: . acyclovir  400 mg Oral BID  . amLODipine  5 mg Oral q morning - 10a  . aspirin EC  81 mg Oral Q breakfast  . atorvastatin  10 mg Oral QPM  . brimonidine  1 drop Both Eyes BID  . Chlorhexidine Gluconate Cloth  6 each Topical Q0600  . [START ON 11/11/2018] cyanocobalamin  1,000 mcg Intramuscular Q30 days  . dorzolamide  1 drop Both Eyes BID  . gabapentin  600 mg Oral Daily  . heparin  5,000 Units Subcutaneous Q8H  . insulin aspart  0-20 Units Subcutaneous TID WC  . insulin aspart  0-5 Units Subcutaneous QHS  . insulin aspart  6 Units Subcutaneous TID WC  . insulin glargine  50 Units Subcutaneous Daily  . ipratropium-albuterol  3 mL Nebulization TID  . latanoprost  1 drop Both Eyes QHS  . mouth rinse  15 mL Mouth Rinse BID  . metoprolol succinate  100 mg Oral QPC breakfast  . mirabegron ER  25 mg Oral Daily  . mirtazapine  15 mg Oral QHS  . mupirocin ointment  1 application Nasal BID  .  palbociclib  75 mg Oral Daily  . pantoprazole  40 mg Oral Daily  . predniSONE  40 mg Oral Q breakfast  . saccharomyces boulardii  250 mg Oral Daily  . sodium chloride flush  3 mL Intravenous Q12H  . tamoxifen  20 mg Oral Daily  . torsemide  20 mg Oral Daily   Continuous: . sodium chloride    . azithromycin Stopped (10/28/18 1358)  . cefTRIAXone (ROCEPHIN)  IV Stopped (10/28/18 1207)   HQI:ONGEXB chloride, acetaminophen **OR** acetaminophen, albuterol, guaiFENesin-dextromethorphan, hydrALAZINE, methocarbamol, ondansetron **OR** ondansetron (ZOFRAN) IV, polyethylene glycol, polyvinyl alcohol, sodium chloride flush  Assesment: She was admitted with acute hypoxic respiratory failure and has community-acquired pneumonia.  She has some element of COPD which is chronic bronchitis at home.  She has hypertension which is fairly well controlled  She had acute metabolic encephalopathy which has resolved  She has diabetes and her blood sugar is still not totally controlled and I have adjusted her insulin again. Principal Problem:   Acute Respiratory failure with Hypoxia ([place]) Active Problems:   HTN (hypertension)   DM type 2 (diabetes mellitus, type 2) ([place])   Hyperlipidemia   Hypertension   Acute Metabolic Encephalopathy acute   Acute hypoxemic respiratory failure ([place])   Fever   Community acquired pneumonia    Plan: Continue current treatments.  Anticipate discharge in approximately  48 hours    LOS: 2 days   [name] 10/29/2018, 8:26 AM

## 2018-10-29 NOTE — TOC Progression Note (Addendum)
Transition of Care [facility]) - Progression Note    Patient Details  Name: [name] MRN: [id number] Date of Birth: 07/09/1933  Transition of Care [facility]) CM/SW [place], [place] Phone Number: 10/29/2018, 9:29 AM  Clinical Narrative:   Spoke to Vaughan Basta with [facility] who states she cannot accept referral today for patient who was previously open to them for services.  She said to try again when we are clearer of d/c date, which at this point is unclear. Continue to follow for possible O2 need as well.    Expected Discharge Plan: [place] Barriers to Discharge: No Barriers Identified  Expected Discharge Plan and Services Expected Discharge Plan: [place]   Discharge Planning Services: CM Consult Post Acute Care Choice: [place] arrangements for the past 2 months: Single Family Home                           HH Arranged: PT, OT, RN [facility] Agency: [place] [facility] Date HH Agency Contacted: 10/28/18 Time [place]: [phone] Representative spoke with at [place]: Leda Quail   Social Determinants of Health (SDOH) Interventions    Readmission Risk Interventions No flowsheet data found.

## 2018-10-29 NOTE — Progress Notes (Signed)
Physical Therapy Treatment Patient Details Name: [name] MRN: [id number] DOB: 03-20-1933 Today's Date: 10/29/2018    History of Present Illness [name]  is a 85 y.o. female with recurrent left breast invasive ductal carcinoma,  status post left lumpectomy, h/o DM2, HLD, HTN who presented to the ED with concerns about fevers up to 101, confusion and disorientation. She was admitted with acute hypoxic respiratory failure. She has community-acquired pneumonia    PT Comments    Patient presents sitting in chair and agreeable for therapy, demonstrates increased endurance/distance for gait training with slightly labored cadence, no loss of balance and limited mostly due to c/o fatigue.  Patient on room air during treatment and SpO2 at 96% after completing exercises, SpO2 maintained at 94% during ambulation and increased to 100% after walking while sitting up in chair - RN notified.  Patient will benefit from continued physical therapy in hospital and recommended venue below to increase strength, balance, endurance for safe ADLs and gait.   Follow Up Recommendations  Home health PT;Supervision for mobility/OOB     Equipment Recommendations  None recommended by PT    Recommendations for Other Services       Precautions / Restrictions Precautions Precautions: Fall Restrictions Weight Bearing Restrictions: No    Mobility  Bed Mobility               General bed mobility comments: presents seated in chair (assisted by nursing staff)  Transfers Overall transfer level: Needs assistance Equipment used: Rolling walker (2 wheeled) Transfers: Sit to/from Stand;Stand Pivot Transfers Sit to Stand: Supervision;Modified independent (Device/Increase time) Stand pivot transfers: Supervision;Modified independent (Device/Increase time)       General transfer comment: slightly labored  movement, no loss of balance  Ambulation/Gait Ambulation/Gait assistance: Supervision;Min  guard Gait Distance (Feet): 55 Feet Assistive device: Rolling walker (2 wheeled) Gait Pattern/deviations: Step-through pattern;Decreased step length - left;Decreased step length - right;Decreased stride length;Trunk flexed Gait velocity: decreased   General Gait Details: slightly labored cadence without loss of balance, on room air with SpO2 starting at 96% and maintained at 94% after greater than 6 minutes ambulation   Stairs             Wheelchair Mobility    Modified Rankin (Stroke Patients Only)       Balance Overall balance assessment: Needs assistance Sitting-balance support: Feet supported;No upper extremity supported Sitting balance-Leahy Scale: Good Sitting balance - Comments: seated in chair   Standing balance support: Bilateral upper extremity supported;During functional activity Standing balance-Leahy Scale: Fair Standing balance comment: using RW                            Cognition Arousal/Alertness: Awake/alert Behavior During Therapy: WFL for tasks assessed/performed Overall Cognitive Status: Within Functional Limits for tasks assessed                                        Exercises General Exercises - Lower Extremity Long Arc Quad: Seated;AROM;Strengthening;Both;10 reps Hip Flexion/Marching: Seated;AROM;Strengthening;Both;10 reps Toe Raises: Seated;AROM;Strengthening;Both;10 reps Heel Raises: Seated;AROM;Strengthening;Both;10 reps    General Comments        Pertinent Vitals/Pain Pain Assessment: No/denies pain    Home Living                      Prior Function  PT Goals (current goals can now be found in the care plan section) Acute Rehab PT Goals Patient Stated Goal: to return home and gain back independence/strength PT Goal Formulation: With patient Time For Goal Achievement: 11/03/18 Potential to Achieve Goals: Good Progress towards PT goals: Progressing toward goals     Frequency    Min 3X/week      PT Plan Current plan remains appropriate    Co-evaluation              AM-PAC PT "6 Clicks" Mobility   Outcome Measure  Help needed turning from your back to your side while in a flat bed without using bedrails?: A Little Help needed moving from lying on your back to sitting on the side of a flat bed without using bedrails?: A Little Help needed moving to and from a bed to a chair (including a wheelchair)?: A Little Help needed standing up from a chair using your arms (e.g., wheelchair or bedside chair)?: None Help needed to walk in hospital room?: A Little Help needed climbing 3-5 steps with a railing? : A Lot 6 Click Score: 18    End of Session   Activity Tolerance: Patient tolerated treatment well;Patient limited by fatigue Patient left: in chair;with call bell/phone within reach Nurse Communication: Mobility status PT Visit Diagnosis: Unsteadiness on feet (R26.81);Other abnormalities of gait and mobility (R26.89);Muscle weakness (generalized) (M62.81)     Time: 0375-4360 PT Time Calculation (min) (ACUTE ONLY): 24 min  Charges:  $Gait Training: 8-22 mins $Therapeutic Exercise: 8-22 mins                     4:16 PM, 10/29/18 Lonell Grandchild, MPT Physical Therapist with [facility] 336 [phone] office [phone] mobile phone

## 2018-10-29 NOTE — Evaluation (Signed)
Clinical/Bedside Swallow Evaluation Patient Details  Name: [name] MRN: [id number] Date of Birth: 1933-07-20  Today's Date: 10/29/2018 Time: SLP Start Time (ACUTE ONLY): 1250 SLP Stop Time (ACUTE ONLY): 1315 SLP Time Calculation (min) (ACUTE ONLY): 25 min  Past Medical History:  Past Medical History:  Diagnosis Date  . Abrasion of arm, left    secondary to fall   . Anemia   . Arthritis   . Breast cancer, left ([place])    er/pr +  . Cataract    no surgery as yet,starting of 3 years  . Chronic kidney disease    renal stones, one episode of Lithotripsy  . Cirrhosis ([facility])    Metavir score 4  . Complication of anesthesia   . Diabetes mellitus   . GERD (gastroesophageal reflux disease)   . Hyperlipidemia   . Hypertension    followed by [facility]. relative to  urology study grp.  Doesn't report ever having a stress test   . Mental disorder   . Multiple falls   . Nocturia   . PONV (postoperative nausea and vomiting)   . Seasonal allergies   . Skin cancer    face- melanoma, 2012- surg. excision     Past Surgical History:  Past Surgical History:  Procedure Laterality Date  . ABDOMINAL HYSTERECTOMY    . BACK SURGERY     x4 back surgery to include rods & screws  . Last in May 2014  . BREAST LUMPECTOMY WITH NEEDLE LOCALIZATION AND AXILLARY SENTINEL LYMPH NODE BX  03/12/2012   Procedure: BREAST LUMPECTOMY WITH NEEDLE LOCALIZATION AND AXILLARY SENTINEL LYMPH NODE BX;  Surgeon: Merrie Roof, MD;  Location: [facility];  Service: General;  Laterality: Left;  . BREAST SURGERY     lumpectomy x2/left   . CHOLECYSTECTOMY    . COLONOSCOPY N/A 11/20/2013   Dr.Rourk- diverticula was found in the left colon. o/w normal rectal, colonic and terminal ileal mucosa  . ESOPHAGOGASTRODUODENOSCOPY N/A 11/20/2013   Dr.Rourk- abnormal mucosa was found. diffuse snake skinning and friability of the gastric mucosa. patent pylorus. abnormal-appearing first, second and third portion of the duodenum. 62mm  gastric nodule at the GE junction. stomach bx= minimal chronic infammation, GE junction bx= polypoid fragments of gastric cardia type mucosa w/mod chronic inflammation and foveolar hyperplasia  . EYE SURGERY     cataract surgery bilat   . FRACTURE SURGERY     1975- [facility]- L ankle   . GIVENS CAPSULE STUDY N/A 02/24/2014   Procedure: GIVENS CAPSULE STUDY;  Surgeon: Daneil Dolin, MD;  Location[facility];  Service: Endoscopy;  Laterality: N/A;  . RE-EXCISION OF BREAST LUMPECTOMY  03/22/2012   Procedure: RE-EXCISION OF BREAST LUMPECTOMY;  Surgeon: Merrie Roof, MD;  Location: [facility];  Service: General;  Laterality: Left;  re-excision left breast lumpectomy cavity  ER+, PR+  . ROTATOR CUFF REPAIR Bilateral   . SHOULDER SURGERY    . SKIN BIOPSY    . TOTAL KNEE ARTHROPLASTY Right 12/18/2014   Procedure: RIGHT TOTAL KNEE ARTHROPLASTY;  Surgeon: Sydnee Cabal, MD;  Location: [facility];  Service: Orthopedics;  Laterality: Right;  . TUBAL LIGATION     HPI:  [name]  is a 85 y.o. female with recurrent left breast invasive ductal carcinoma,  status post left lumpectomy, h/o DM2, HLD, HTN who presented to the ED with concerns about fevers up to 101, confusion and disorientation. She was admitted with acute hypoxic respiratory failure. She  has community-acquired pneumonia. BSE requested   Assessment / Plan / Recommendation Clinical Impression  Oral motor examination is WNL. Pt does not exhibit any overt signs or symptoms of aspiration during self presentation of reuglar textures, puree, and thin liquids via cup/straw. Pt reports episodic coughing with liquids, which was not present today. She would like to pursue objective assessment via MBSS due to current PNA and bronchitis a few months ago. OK to continue diet as ordered and will pursue MBSS tomorrow.   SLP Visit Diagnosis: Dysphagia, unspecified (R13.10)    Aspiration Risk  No limitations    Diet Recommendation  Regular;Thin liquid   Liquid Administration via: Cup;Straw Medication Administration: Whole meds with liquid Supervision: Patient able to self feed Compensations: Slow rate Postural Changes: Seated upright at 90 degrees;Remain upright for at least 30 minutes after po intake    Other  Recommendations Oral Care Recommendations: Oral care BID;Patient independent with oral care Other Recommendations: Clarify dietary restrictions   Follow up Recommendations None      Frequency and Duration min 2x/week  1 week       Prognosis Prognosis for Safe Diet Advancement: Good      Swallow Study   General Date of Onset: 10/26/18 HPI: [name]  is a 85 y.o. female with recurrent left breast invasive ductal carcinoma,  status post left lumpectomy, h/o DM2, HLD, HTN who presented to the ED with concerns about fevers up to 101, confusion and disorientation. She was admitted with acute hypoxic respiratory failure. She has community-acquired pneumonia. BSE requested Type of Study: Bedside Swallow Evaluation Previous Swallow Assessment: None on record Diet Prior to this Study: Regular;Thin liquids Temperature Spikes Noted: No Respiratory Status: Nasal cannula History of Recent Intubation: No Behavior/Cognition: Alert;Cooperative;Pleasant mood Oral Cavity Assessment: Within Functional Limits Oral Care Completed by SLP: No Oral Cavity - Dentition: Adequate natural dentition Vision: Functional for self-feeding Self-Feeding Abilities: Able to feed self Patient Positioning: Upright in chair Baseline Vocal Quality: Normal Volitional Cough: Strong Volitional Swallow: Able to elicit    Oral/Motor/Sensory Function Overall Oral Motor/Sensory Function: Within functional limits(small oral cavity)   Ice Chips Ice chips: Within functional limits Presentation: Spoon   Thin Liquid Thin Liquid: Within functional limits Presentation: Cup;Self Fed;Straw    Nectar Thick Nectar Thick Liquid: Not tested    Honey Thick Honey Thick Liquid: Not tested   Puree Puree: Within functional limits Presentation: Spoon;Self Fed   Solid     Solid: Within functional limits Presentation: Self Fed     Thank you,  Genene Churn, [place]  Laylee Schooley 10/29/2018,6:06 PM

## 2018-10-30 ENCOUNTER — Inpatient Hospital Stay (HOSPITAL_COMMUNITY): Payer: Medicare HMO

## 2018-10-30 LAB — CBC WITH DIFFERENTIAL/PLATELET
Abs Immature Granulocytes: 0.02 10*3/uL (ref 0.00–0.07)
Basophils Absolute: 0 10*3/uL (ref 0.0–0.1)
Basophils Relative: 0 %
Eosinophils Absolute: 0 10*3/uL (ref 0.0–0.5)
Eosinophils Relative: 1 %
HCT: 32.6 % — ABNORMAL LOW (ref 36.0–46.0)
Hemoglobin: 11.1 g/dL — ABNORMAL LOW (ref 12.0–15.0)
Immature Granulocytes: 1 %
Lymphocytes Relative: 36 %
Lymphs Abs: 1 10*3/uL (ref 0.7–4.0)
MCH: 39.2 pg — ABNORMAL HIGH (ref 26.0–34.0)
MCHC: 34 g/dL (ref 30.0–36.0)
MCV: 115.2 fL — ABNORMAL HIGH (ref 80.0–100.0)
Monocytes Absolute: 0.3 10*3/uL (ref 0.1–1.0)
Monocytes Relative: 9 %
Neutro Abs: 1.5 10*3/uL — ABNORMAL LOW (ref 1.7–7.7)
Neutrophils Relative %: 53 %
Platelets: 82 10*3/uL — ABNORMAL LOW (ref 150–400)
RBC: 2.83 MIL/uL — ABNORMAL LOW (ref 3.87–5.11)
RDW: 14.7 % (ref 11.5–15.5)
WBC: 2.9 10*3/uL — ABNORMAL LOW (ref 4.0–10.5)
nRBC: 0 % (ref 0.0–0.2)

## 2018-10-30 LAB — GLUCOSE, CAPILLARY
Glucose-Capillary: 74 mg/dL (ref 70–99)
Glucose-Capillary: 290 mg/dL — ABNORMAL HIGH (ref 70–99)
Glucose-Capillary: 160 mg/dL — ABNORMAL HIGH (ref 70–99)
Glucose-Capillary: 207 mg/dL — ABNORMAL HIGH (ref 70–99)

## 2018-10-30 LAB — COMPREHENSIVE METABOLIC PANEL
ALT: 15 U/L (ref 0–44)
AST: 26 U/L (ref 15–41)
Albumin: 3.1 g/dL — ABNORMAL LOW (ref 3.5–5.0)
Alkaline Phosphatase: 51 U/L (ref 38–126)
Anion gap: 4 — ABNORMAL LOW (ref 5–15)
BUN: 39 mg/dL — ABNORMAL HIGH (ref 8–23)
CO2: 27 mmol/L (ref 22–32)
Calcium: 8.4 mg/dL — ABNORMAL LOW (ref 8.9–10.3)
Chloride: 108 mmol/L (ref 98–111)
Creatinine, Ser: 1.62 mg/dL — ABNORMAL HIGH (ref 0.44–1.00)
GFR calc Af Amer: 34 mL/min — ABNORMAL LOW (ref >60)
GFR calc non Af Amer: 29 mL/min — ABNORMAL LOW (ref >60)
Glucose, Bld: 111 mg/dL — ABNORMAL HIGH (ref 70–99)
Potassium: 3.9 mmol/L (ref 3.5–5.1)
Sodium: 139 mmol/L (ref 135–145)
Total Bilirubin: 0.6 mg/dL (ref 0.3–1.2)
Total Protein: 6.4 g/dL — ABNORMAL LOW (ref 6.5–8.1)

## 2018-10-30 NOTE — Care Management Important Message (Signed)
Important Message  Patient Details  Name: [name] MRN: [id number] Date of Birth: [birth year]   Medicare Important Message Given:  Yes     Tommy Medal 10/30/2018, 1:49 PM

## 2018-10-30 NOTE — Progress Notes (Signed)
Physical Therapy Treatment Patient Details Name: [name] MRN: [id number] DOB: Jul 19, 1933 Today's Date: 10/30/2018    History of Present Illness [name]  is a 85 y.o. female with recurrent left breast invasive ductal carcinoma,  status post left lumpectomy, h/o DM2, HLD, HTN who presented to the ED with concerns about fevers up to 101, confusion and disorientation. She was admitted with acute hypoxic respiratory failure. She has community-acquired pneumonia    PT Comments    Patient presents seated EOB and agreeable to therapy. Pt able to ambulate on room air with RW, maintains O2 sat 97%, demonstrates mild shortness of breath due to decreased endurance. Pt with decreased cadence and bil step length, educated to maintain body within RW frame with turns and backing to seated surfaces to maintain balance. Pt safely uses BUE to rise and lower from chair without verbal cues. Pt tolerates increase reps for seated exercises, but requires therapeutic rest breaks between sets due to fatiguing. Patient will benefit from continued physical therapy in hospital and recommended venue below to increase strength, balance, endurance for safe ADLs and gait.     Follow Up Recommendations  Home health PT;Supervision for mobility/OOB     Equipment Recommendations  None recommended by PT    Recommendations for Other Services       Precautions / Restrictions Precautions Precautions: Fall Restrictions Weight Bearing Restrictions: No    Mobility  Bed Mobility               General bed mobility comments: seated EOB upon arrival  Transfers Overall transfer level: Needs assistance Equipment used: Rolling walker (2 wheeled) Transfers: Sit to/from [facility] Sit to Stand: Modified independent (Device/Increase time) Stand pivot transfers: Supervision;Modified independent (Device/Increase time)       General transfer comment: UE assist to rise from EOB, steady upon  standing  Ambulation/Gait Ambulation/Gait assistance: Supervision Gait Distance (Feet): 60 Feet Assistive device: Rolling walker (2 wheeled) Gait Pattern/deviations: Step-through pattern;Decreased step length - left;Decreased step length - right;Decreased stride length;Trendelenburg;Trunk flexed Gait velocity: decreased   General Gait Details: decreased cadence, increased steps and time to complete turns, on room air with O2 sat 97%, verbal cues to maintain body positioned within RW with turning and backing to seated surfaces   Stairs             Wheelchair Mobility    Modified Rankin (Stroke Patients Only)       Balance Overall balance assessment: Needs assistance Sitting-balance support: Feet supported;No upper extremity supported Sitting balance-Leahy Scale: Good Sitting balance - Comments: up in chair   Standing balance support: Bilateral upper extremity supported;During functional activity Standing balance-Leahy Scale: Fair Standing balance comment: using RW                            Cognition Arousal/Alertness: Awake/alert Behavior During Therapy: WFL for tasks assessed/performed Overall Cognitive Status: Within Functional Limits for tasks assessed                                        Exercises General Exercises - Lower Extremity Long Arc Quad: Seated;AROM;Strengthening;Both;15 reps Hip Flexion/Marching: Seated;AROM;Strengthening;Both;15 reps Toe Raises: Seated;AROM;Strengthening;Both;15 reps Heel Raises: Seated;AROM;Strengthening;Both;15 reps    General Comments        Pertinent Vitals/Pain Pain Assessment: No/denies pain    Home Living  Prior Function            PT Goals (current goals can now be found in the care plan section) Acute Rehab PT Goals Patient Stated Goal: to return home and gain back independence/strength PT Goal Formulation: With patient Time For Goal Achievement:  11/03/18 Potential to Achieve Goals: Good Progress towards PT goals: Progressing toward goals    Frequency    Min 3X/week      PT Plan Current plan remains appropriate    Co-evaluation              AM-PAC PT "6 Clicks" Mobility   Outcome Measure  Help needed turning from your back to your side while in a flat bed without using bedrails?: A Little Help needed moving from lying on your back to sitting on the side of a flat bed without using bedrails?: A Little Help needed moving to and from a bed to a chair (including a wheelchair)?: A Little Help needed standing up from a chair using your arms (e.g., wheelchair or bedside chair)?: None Help needed to walk in hospital room?: A Little Help needed climbing 3-5 steps with a railing? : A Lot 6 Click Score: 18    End of Session   Activity Tolerance: Patient tolerated treatment well;Patient limited by fatigue Patient left: in chair;with call bell/phone within reach Nurse Communication: Mobility status PT Visit Diagnosis: Unsteadiness on feet (R26.81);Other abnormalities of gait and mobility (R26.89);Muscle weakness (generalized) (M62.81)     Time: 1610-9604 PT Time Calculation (min) (ACUTE ONLY): 16 min  Charges:  $Gait Training: 8-22 mins                     Tori Lavanya Roa PT, DPT 10/30/18, 10:12 AM [phone]

## 2018-10-30 NOTE — Progress Notes (Signed)
Subjective: She says she feels better.  She has been able to walk around without oxygen now.  She is scheduled for modified barium swallow today.  Objective: Vital signs in last 24 hours: Temp:  [97.6 F (36.4 C)-98.2 F (36.8 C)] 97.6 F (36.4 C) (09/02 0455) Pulse Rate:  [64-76] 64 (09/02 0455) Resp:  [16-18] 16 (09/02 0455) BP: (92-141)/(71-84) 138/84 (09/02 0455) SpO2:  [94 %-97 %] 97 % (09/02 0721) Weight change:  Last BM Date: 10/29/18  Intake/Output from previous day: 09/01 0701 - 09/02 0700 In: 1070 [P.O.:720; IV Piggyback:350] Out: -   PHYSICAL EXAM General appearance: alert, cooperative and no distress Resp: rhonchi bilaterally Cardio: regular rate and rhythm, S1, S2 normal, no murmur, click, rub or gallop GI: soft, non-tender; bowel sounds normal; no masses,  no organomegaly Extremities: extremities normal, atraumatic, no cyanosis or edema  Lab Results:  Results for orders placed or performed during the hospital encounter of 10/26/18 (from the past 48 hour(s))  Glucose, capillary     Status: Abnormal   Collection Time: 10/28/18 11:34 AM  Result Value Ref Range   Glucose-Capillary 212 (H) 70 - 99 mg/dL   Comment 1 Document in Chart   Glucose, capillary     Status: Abnormal   Collection Time: 10/28/18  4:52 PM  Result Value Ref Range   Glucose-Capillary 218 (H) 70 - 99 mg/dL  Glucose, capillary     Status: Abnormal   Collection Time: 10/28/18  9:41 PM  Result Value Ref Range   Glucose-Capillary 277 (H) 70 - 99 mg/dL  Glucose, capillary     Status: Abnormal   Collection Time: 10/29/18  7:41 AM  Result Value Ref Range   Glucose-Capillary 131 (H) 70 - 99 mg/dL  Glucose, capillary     Status: Abnormal   Collection Time: 10/29/18 11:28 AM  Result Value Ref Range   Glucose-Capillary 234 (H) 70 - 99 mg/dL  Glucose, capillary     Status: Abnormal   Collection Time: 10/29/18  5:01 PM  Result Value Ref Range   Glucose-Capillary 217 (H) 70 - 99 mg/dL  Glucose,  capillary     Status: Abnormal   Collection Time: 10/29/18  9:59 PM  Result Value Ref Range   Glucose-Capillary 284 (H) 70 - 99 mg/dL  CBC with Differential/Platelet     Status: Abnormal   Collection Time: 10/30/18  6:16 AM  Result Value Ref Range   WBC 2.9 (L) 4.0 - 10.5 K/uL   RBC 2.83 (L) 3.87 - 5.11 MIL/uL   Hemoglobin 11.1 (L) 12.0 - 15.0 g/dL   HCT 32.6 (L) 36.0 - 46.0 %   MCV 115.2 (H) 80.0 - 100.0 fL   MCH 39.2 (H) 26.0 - 34.0 pg   MCHC 34.0 30.0 - 36.0 g/dL   RDW 14.7 11.5 - 15.5 %   Platelets 82 (L) 150 - 400 K/uL    Comment: PLATELET COUNT CONFIRMED BY SMEAR SPECIMEN CHECKED FOR CLOTS    nRBC 0.0 0.0 - 0.2 %   Neutrophils Relative % 53 %   Neutro Abs 1.5 (L) 1.7 - 7.7 K/uL   Lymphocytes Relative 36 %   Lymphs Abs 1.0 0.7 - 4.0 K/uL   Monocytes Relative 9 %   Monocytes Absolute 0.3 0.1 - 1.0 K/uL   Eosinophils Relative 1 %   Eosinophils Absolute 0.0 0.0 - 0.5 K/uL   Basophils Relative 0 %   Basophils Absolute 0.0 0.0 - 0.1 K/uL   Immature Granulocytes 1 %  Abs Immature Granulocytes 0.02 0.00 - 0.07 K/uL    Comment: Performed at [facility], [address], [place], [place] [address]  Comprehensive metabolic panel     Status: Abnormal   Collection Time: 10/30/18  6:16 AM  Result Value Ref Range   Sodium 139 135 - 145 mmol/L   Potassium 3.9 3.5 - 5.1 mmol/L   Chloride 108 98 - 111 mmol/L   CO2 27 22 - 32 mmol/L   Glucose, Bld 111 (H) 70 - 99 mg/dL   BUN 39 (H) 8 - 23 mg/dL   Creatinine, Ser 1.62 (H) 0.44 - 1.00 mg/dL   Calcium 8.4 (L) 8.9 - 10.3 mg/dL   Total Protein 6.4 (L) 6.5 - 8.1 g/dL   Albumin 3.1 (L) 3.5 - 5.0 g/dL   AST 26 15 - 41 U/L   ALT 15 0 - 44 U/L   Alkaline Phosphatase 51 38 - 126 U/L   Total Bilirubin 0.6 0.3 - 1.2 mg/dL   GFR calc non Af Amer 29 (L) >60 mL/min   GFR calc Af Amer 34 (L) >60 mL/min   Anion gap 4 (L) 5 - 15    Comment: Performed at [facility], [address] [place], [place] [address]  Glucose, capillary     Status: None    Collection Time: 10/30/18  7:32 AM  Result Value Ref Range   Glucose-Capillary 74 70 - 99 mg/dL    ABGS No results for input(s): PHART, PO2ART, TCO2, HCO3 in the last 72 hours.  Invalid input(s): PCO2 CULTURES Recent Results (from the past 240 hour(s))  Urine culture     Status: Abnormal   Collection Time: 10/26/18  5:32 AM   Specimen: Urine, Clean Catch  Result Value Ref Range Status   Specimen Description   Final    URINE, CLEAN CATCH Performed at [facility], [address] [place], [place] [address]    Special Requests   Final    NONE Performed at [facility], [address] [place], [place] [address]    [place], SUGGEST RECOLLECTION (A)  Final   Report Status 10/27/2018 FINAL  Final  SARS Coronavirus 2 [facility] order, Performed in [facility] hospital lab) Nasopharyngeal Nasopharyngeal Swab     Status: None   Collection Time: 10/26/18  6:51 AM   Specimen: Nasopharyngeal Swab  Result Value Ref Range Status   SARS Coronavirus 2 NEGATIVE NEGATIVE Final    Comment: (NOTE) If result is NEGATIVE SARS-CoV-2 target nucleic acids are NOT DETECTED. The SARS-CoV-2 RNA is generally detectable in upper and lower  respiratory specimens during the acute phase of infection. The lowest  concentration of SARS-CoV-2 viral copies this assay can detect is 250  copies / mL. A negative result does not preclude SARS-CoV-2 infection  and should not be used as the sole basis for treatment or other  patient management decisions.  A negative result may occur with  improper specimen collection / handling, submission of specimen other  than nasopharyngeal swab, presence of viral mutation(s) within the  areas targeted by this assay, and inadequate number of viral copies  (<250 copies / mL). A negative result must be combined with clinical  observations, patient history, and epidemiological information. If result is POSITIVE SARS-CoV-2 target nucleic acids are  DETECTED. The SARS-CoV-2 RNA is generally detectable in upper and lower  respiratory specimens dur ing the acute phase of infection.  Positive  results are indicative of active infection with SARS-CoV-2.  Clinical  correlation with patient history and other diagnostic information is  necessary to determine patient infection status.  Positive results do  not rule out bacterial infection or co-infection with other viruses. If result is PRESUMPTIVE POSTIVE SARS-CoV-2 nucleic acids MAY BE PRESENT.   A presumptive positive result was obtained on the submitted specimen  and confirmed on repeat testing.  While 2019 novel coronavirus  (SARS-CoV-2) nucleic acids may be present in the submitted sample  additional confirmatory testing may be necessary for epidemiological  and / or clinical management purposes  to differentiate between  SARS-CoV-2 and other Sarbecovirus currently known to infect humans.  If clinically indicated additional testing with an alternate test  methodology [phone]) is advised. The SARS-CoV-2 RNA is generally  detectable in upper and lower respiratory sp ecimens during the acute  phase of infection. The expected result is Negative. Fact Sheet for Patients:  StrictlyIdeas.no Fact Sheet for Healthcare Providers: BankingDealers.co.za This test is not yet approved or cleared by the [place] FDA and has been authorized for detection and/or diagnosis of SARS-CoV-2 by FDA under an Emergency Use Authorization (EUA).  This EUA will remain in effect (meaning this test can be used) for the duration of the COVID-19 declaration under Section 564(b)(1) of the Act, 21 U.S.C. section 360bbb-3(b)(1), unless the authorization is terminated or revoked sooner. Performed at [facility], [address], [place], [place] [address]   Blood Culture (routine x 2)     Status: None (Preliminary result)   Collection Time: 10/26/18  7:57 AM    Specimen: BLOOD RIGHT ARM  Result Value Ref Range Status   Specimen Description BLOOD RIGHT ARM  Final   Special Requests   Final    BOTTLES DRAWN AEROBIC AND ANAEROBIC Blood Culture adequate volume   Culture   Final    NO GROWTH 3 DAYS Performed at [facility], [address], [place], [place] [address]    Report Status PENDING  Incomplete  Blood Culture (routine x 2)     Status: None (Preliminary result)   Collection Time: 10/26/18  7:59 AM   Specimen: BLOOD RIGHT ARM  Result Value Ref Range Status   Specimen Description BLOOD RIGHT ARM  Final   Special Requests   Final    BOTTLES DRAWN AEROBIC AND ANAEROBIC Blood Culture adequate volume   Culture   Final    NO GROWTH 3 DAYS Performed at [facility], [address], [place], [place] [address]    Report Status PENDING  Incomplete  MRSA PCR Screening     Status: Abnormal   Collection Time: 10/27/18  5:31 AM   Specimen: Nasal Mucosa; Nasopharyngeal  Result Value Ref Range Status   MRSA by PCR POSITIVE (A) NEGATIVE Final    Comment:        The GeneXpert MRSA Assay (FDA approved for NASAL specimens only), is one component of a comprehensive MRSA colonization surveillance program. It is not intended to diagnose MRSA infection nor to guide or monitor treatment for MRSA infections. RESULT CALLED TO, READ BACK BY AND VERIFIED WITH: Terance Ice @1026   08/30/2020KAY Performed at [facility], [address] [place],  [address]    Studies/Results: No results found.  Medications:  Prior to Admission:  Medications Prior to Admission  Medication Sig Dispense Refill Last Dose  . acyclovir (ZOVIRAX) 400 MG tablet Take 1 tablet (400 mg total) by mouth 2 (two) times daily. 120 tablet 3 10/25/2018 at Unknown time  . ALPHAGAN P 0.1 % SOLN Place  1 drop into both eyes 2 (two) times daily.    10/25/2018 at Unknown time  . amLODipine (NORVASC) 5 MG tablet Take 5 mg by mouth every morning.    10/25/2018 at Unknown time  . aspirin  EC 81 MG tablet Take 81 mg by mouth daily.   10/25/2018 at 1000  . atorvastatin (LIPITOR) 10 MG tablet Take 10 mg by mouth every evening.    10/25/2018 at Unknown time  . Bacillus Coagulans-Inulin (PROBIOTIC FORMULA PO) Take 1 capsule by mouth daily.   10/25/2018 at Unknown time  . BD PEN NEEDLE NANO U/F 32G X 4 MM MISC      . cyanocobalamin (,VITAMIN B-12,) 1000 MCG/ML injection Inject 1,000 mcg into the muscle every 30 (thirty) days.    Past Month at Unknown time  . dorzolamide (TRUSOPT) 2 % ophthalmic solution Place 1 drop into both eyes 2 (two) times daily.   4 10/25/2018 at Unknown time  . EPIPEN 2-PAK 0.3 MG/0.3ML SOAJ injection Inject 0.3 mg into the muscle as needed for anaphylaxis.      . fulvestrant (FASLODEX) 250 MG/5ML injection Inject 500 mg into the muscle every 14 (fourteen) days. One injection each buttock over 1-2 minutes. Warm prior to use.     . gabapentin (NEURONTIN) 600 MG tablet Take 600 mg by mouth daily.    10/25/2018 at Unknown time  . glipiZIDE (GLUCOTROL) 5 MG tablet Take 5 mg by mouth daily before breakfast.    10/25/2018 at Unknown time  . guaiFENesin-dextromethorphan (ROBITUSSIN DM) 100-10 MG/5ML syrup Take 5 mLs by mouth every 4 (four) hours as needed for cough. 118 mL 0   . ipratropium-albuterol (DUONEB) 0.5-2.5 (3) MG/3ML SOLN Take 3 mLs by nebulization 3 (three) times daily. 360 mL 3 10/25/2018 at Unknown time  . Lancets (ONETOUCH DELICA PLUS [id number]) MISC      . latanoprost (XALATAN) 0.005 % ophthalmic solution Place 1 drop into both eyes at bedtime.    10/25/2018 at Unknown time  . methocarbamol (ROBAXIN) 500 MG tablet Take 500 mg by mouth 2 (two) times daily as needed for muscle spasms.   Past Week at Unknown time  . metoprolol succinate (TOPROL-XL) 100 MG 24 hr tablet Take 100 mg by mouth every morning. Take with or immediately following a meal.   10/25/2018 at 100  . mirtazapine (REMERON) 15 MG tablet Take 15 mg by mouth at bedtime.    10/25/2018 at Unknown time  .  MYRBETRIQ 50 MG TB24 tablet Take 50 mg by mouth daily.    10/25/2018 at Unknown time  . omeprazole (PRILOSEC) 20 MG capsule Take 20 mg by mouth daily.   10/25/2018 at Unknown time  . ONETOUCH ULTRA test strip      . palbociclib (IBRANCE) 75 MG tablet Take 1 tablet (75 mg total) by mouth daily. Take for 21 days on, 7 days off, repeat every 28 days. 21 tablet 6 10/25/2018 at Unknown time  . Polyethyl Glycol-Propyl Glycol (SYSTANE ULTRA) 0.4-0.3 % SOLN Place 1-2 drops into both eyes daily as needed (for itching eyes).   10/25/2018 at Unknown time  . tamoxifen (NOLVADEX) 20 MG tablet Take 1 tablet (20 mg total) by mouth daily. 90 tablet 12 10/25/2018 at Unknown time  . torsemide (DEMADEX) 20 MG tablet Take 1 tablet (20 mg total) by mouth daily. 30 tablet 5 10/25/2018 at Unknown time  . TRESIBA FLEXTOUCH 100 UNIT/ML SOPN FlexTouch Pen Inject 0.5 mLs (50 Units total) into the skin daily. 5 pen  5 10/25/2018 at Unknown time   Scheduled: . acyclovir  400 mg Oral BID  . amLODipine  5 mg Oral q morning - 10a  . aspirin EC  81 mg Oral Q breakfast  . atorvastatin  10 mg Oral QPM  . brimonidine  1 drop Both Eyes BID  . Chlorhexidine Gluconate Cloth  6 each Topical Q0600  . [START ON 11/11/2018] cyanocobalamin  1,000 mcg Intramuscular Q30 days  . dorzolamide  1 drop Both Eyes BID  . gabapentin  600 mg Oral Daily  . heparin  5,000 Units Subcutaneous Q8H  . insulin aspart  0-20 Units Subcutaneous TID WC  . insulin aspart  0-5 Units Subcutaneous QHS  . insulin aspart  6 Units Subcutaneous TID WC  . insulin glargine  50 Units Subcutaneous Daily  . ipratropium-albuterol  3 mL Nebulization TID  . latanoprost  1 drop Both Eyes QHS  . mouth rinse  15 mL Mouth Rinse BID  . metoprolol succinate  100 mg Oral QPC breakfast  . mirabegron ER  25 mg Oral Daily  . mirtazapine  15 mg Oral QHS  . mupirocin ointment  1 application Nasal BID  . palbociclib  75 mg Oral Daily  . pantoprazole  40 mg Oral Daily  . predniSONE  40  mg Oral Q breakfast  . saccharomyces boulardii  250 mg Oral Daily  . sodium chloride flush  3 mL Intravenous Q12H  . tamoxifen  20 mg Oral Daily  . torsemide  20 mg Oral Daily   Continuous: . sodium chloride    . azithromycin 500 mg (10/29/18 1137)  . cefTRIAXone (ROCEPHIN)  IV 2 g (10/29/18 1513)   OEH:OZYYQM chloride, acetaminophen **OR** acetaminophen, albuterol, guaiFENesin-dextromethorphan, hydrALAZINE, methocarbamol, ondansetron **OR** ondansetron (ZOFRAN) IV, polyethylene glycol, polyvinyl alcohol, sodium chloride flush  Assesment: She was admitted with acute hypoxic respiratory failure that is related to community-acquired pneumonia.  She has some element of COPD which is chronic bronchitis at baseline.  She is improving.  I am concerned that she may have been aspirating so she is going to have modified barium swallow to be sure.  She had acute metabolic encephalopathy on admission and that has resolved  She has hypertension which is well controlled  She has diabetes which is well controlled Principal Problem:   Acute Respiratory failure with Hypoxia ([place]) Active Problems:   HTN (hypertension)   DM type 2 (diabetes mellitus, type 2) ([place])   Hyperlipidemia   Hypertension   Acute Metabolic Encephalopathy acute   Acute hypoxemic respiratory failure ([place])   Fever   Community acquired pneumonia    Plan: Continue treatments.  Probable discharge tomorrow    LOS: 3 days   Alonza Bogus 10/30/2018, 8:55 AM

## 2018-10-30 NOTE — Progress Notes (Signed)
Modified Barium Swallow Progress Note  Patient Details  Name: [name] MRN: [id number] Date of Birth: Jun 28, 1933  Today's Date: 10/30/2018  Modified Barium Swallow completed.  Full report located under Chart Review in the Imaging Section.  Brief recommendations include the following:  Clinical Impression  Pt presents with normal oropharyngeal swallow for age. Swallow trigger at the level of the valleculae with thins and occasionally to the pyriforms. Pt with flash penetration of thins before the swallow with a single teaspoon presentation and when taking barium tablet with thin, which was immediately removed and no residuals remained. Pt noted to have a prominent cricopharyngeus and tiny outpouching near UES which could be a tiny Zenker's diverticulum, however no radiologist present to confirm. Recommend regular textures and thin liquids and Pt to eat/drink slowly. No further SLP services indicated at this time. SLP will sign off. Pt in agreement and appreciative of information.    Swallow Evaluation Recommendations       SLP Diet Recommendations: Regular solids;Thin liquid   Liquid Administration via: Cup;Straw   Medication Administration: Whole meds with liquid   Supervision: Patient able to self feed   Compensations: Slow rate   Postural Changes: Remain semi-upright after after feeds/meals (Comment);Seated upright at 90 degrees   Oral Care Recommendations: Oral care BID;Patient independent with oral care   Other Recommendations: Clarify dietary restrictions   Thank you,  [name], [place]  [name],Stacey 10/30/2018,12:38 PM

## 2018-10-31 LAB — CULTURE, BLOOD (ROUTINE X 2)
Culture: NO GROWTH
Culture: NO GROWTH
Special Requests: ADEQUATE
Special Requests: ADEQUATE

## 2018-10-31 LAB — GLUCOSE, CAPILLARY
Glucose-Capillary: 55 mg/dL — ABNORMAL LOW (ref 70–99)
Glucose-Capillary: 66 mg/dL — ABNORMAL LOW (ref 70–99)
Glucose-Capillary: 250 mg/dL — ABNORMAL HIGH (ref 70–99)
Glucose-Capillary: 272 mg/dL — ABNORMAL HIGH (ref 70–99)

## 2018-10-31 MED ORDER — PREDNISONE 10 MG (21) PO TBPK: ORAL_TABLET | ORAL | Status: DC

## 2018-10-31 MED ORDER — LEVOFLOXACIN 500 MG PO TABS: 500.0000 mg | ORAL_TABLET | Freq: Every day | ORAL | 0 refills | Status: DC

## 2018-10-31 NOTE — Progress Notes (Signed)
Pt alert and oriented. No distress. Fasting CBG:55, juice given CBG: 66, ate breakfast CBG:250. Dr Luan Pulling notified with no new orders.

## 2018-10-31 NOTE — TOC Transition Note (Signed)
Transition of Care [facility]) - CM/SW Discharge Note   Patient Details  Name: [name] MRN: [id number] Date of Birth: 1933-04-27  [facility] of [facility]) CM/SW Contact:  Trish Mage, LCSW Phone Number: 10/31/2018, 4:15 PM   Clinical Narrative:   Pt to d/c today.  Linda at [place] alerted.  Services to start on Tuesday.  Family to  provide transportation home.    Final next level of care: [place] Barriers to Discharge: No Barriers Identified   Patient Goals and CMS Choice Patient states their goals for this hospitalization and ongoing recovery are:: "I am going to stay with my daughter when I leave the hospital."      Discharge Placement                       Discharge Plan and Services   Discharge Planning Services: CM Consult Post Acute Care Choice: Home Health                    HH Arranged: PT, OT, RN [facility] Agency: [place] ([place]) Date [facility] Agency Contacted: 10/28/18 Time [place]: 59 Representative spoke with at [place]: Leda Quail  Social Determinants of Health ([place]) Interventions     Readmission Risk Interventions No flowsheet data found.

## 2018-10-31 NOTE — Progress Notes (Signed)
Subjective: She says she feels much better.  She is not coughing much.  She is off oxygen.  She is not too short of breath but she still gets short of breath with exertion.  She had speech evaluation yesterday and that was okay.  She needs to be careful basically.  Objective: Vital signs in last 24 hours: Temp:  [97.8 F (36.6 C)-98.2 F (36.8 C)] 98.2 F (36.8 C) (09/03 0539) Pulse Rate:  [60-80] 66 (09/03 0539) Resp:  [16-20] 20 (09/03 0539) BP: (129-158)/(55-79) 158/79 (09/03 0539) SpO2:  [93 %-100 %] 96 % (09/03 0539) Weight change:  Last BM Date: 10/29/18  Intake/Output from previous day: 09/02 0701 - 09/03 0700 In: 720 [P.O.:720] Out: -   PHYSICAL EXAM General appearance: alert, cooperative and no distress Resp: clear to auscultation bilaterally Cardio: regular rate and rhythm, S1, S2 normal, no murmur, click, rub or gallop GI: soft, non-tender; bowel sounds normal; no masses,  no organomegaly Extremities: extremities normal, atraumatic, no cyanosis or edema  Lab Results:  Results for orders placed or performed during the hospital encounter of 10/26/18 (from the past 48 hour(s))  Glucose, capillary     Status: Abnormal   Collection Time: 10/29/18 11:28 AM  Result Value Ref Range   Glucose-Capillary 234 (H) 70 - 99 mg/dL  Glucose, capillary     Status: Abnormal   Collection Time: 10/29/18  5:01 PM  Result Value Ref Range   Glucose-Capillary 217 (H) 70 - 99 mg/dL  Glucose, capillary     Status: Abnormal   Collection Time: 10/29/18  9:59 PM  Result Value Ref Range   Glucose-Capillary 284 (H) 70 - 99 mg/dL  CBC with Differential/Platelet     Status: Abnormal   Collection Time: 10/30/18  6:16 AM  Result Value Ref Range   WBC 2.9 (L) 4.0 - 10.5 K/uL   RBC 2.83 (L) 3.87 - 5.11 MIL/uL   Hemoglobin 11.1 (L) 12.0 - 15.0 g/dL   HCT 32.6 (L) 36.0 - 46.0 %   MCV 115.2 (H) 80.0 - 100.0 fL   MCH 39.2 (H) 26.0 - 34.0 pg   MCHC 34.0 30.0 - 36.0 g/dL   RDW 14.7 11.5 - 15.5 %    Platelets 82 (L) 150 - 400 K/uL    Comment: PLATELET COUNT CONFIRMED BY SMEAR SPECIMEN CHECKED FOR CLOTS    nRBC 0.0 0.0 - 0.2 %   Neutrophils Relative % 53 %   Neutro Abs 1.5 (L) 1.7 - 7.7 K/uL   Lymphocytes Relative 36 %   Lymphs Abs 1.0 0.7 - 4.0 K/uL   Monocytes Relative 9 %   Monocytes Absolute 0.3 0.1 - 1.0 K/uL   Eosinophils Relative 1 %   Eosinophils Absolute 0.0 0.0 - 0.5 K/uL   Basophils Relative 0 %   Basophils Absolute 0.0 0.0 - 0.1 K/uL   Immature Granulocytes 1 %   Abs Immature Granulocytes 0.02 0.00 - 0.07 K/uL    Comment: Performed at [facility], [address] [place], [place] [address]  Comprehensive metabolic panel     Status: Abnormal   Collection Time: 10/30/18  6:16 AM  Result Value Ref Range   Sodium 139 135 - 145 mmol/L   Potassium 3.9 3.5 - 5.1 mmol/L   Chloride 108 98 - 111 mmol/L   CO2 27 22 - 32 mmol/L   Glucose, Bld 111 (H) 70 - 99 mg/dL   BUN 39 (H) 8 - 23 mg/dL   Creatinine, Ser 1.62 (H) 0.44 -  1.00 mg/dL   Calcium 8.4 (L) 8.9 - 10.3 mg/dL   Total Protein 6.4 (L) 6.5 - 8.1 g/dL   Albumin 3.1 (L) 3.5 - 5.0 g/dL   AST 26 15 - 41 U/L   ALT 15 0 - 44 U/L   Alkaline Phosphatase 51 38 - 126 U/L   Total Bilirubin 0.6 0.3 - 1.2 mg/dL   GFR calc non Af Amer 29 (L) >60 mL/min   GFR calc Af Amer 34 (L) >60 mL/min   Anion gap 4 (L) 5 - 15    Comment: Performed at [facility], [address], [place], [place] [address]  Glucose, capillary     Status: None   Collection Time: 10/30/18  7:32 AM  Result Value Ref Range   Glucose-Capillary 74 70 - 99 mg/dL  Glucose, capillary     Status: Abnormal   Collection Time: 10/30/18 10:57 AM  Result Value Ref Range   Glucose-Capillary 290 (H) 70 - 99 mg/dL  Glucose, capillary     Status: Abnormal   Collection Time: 10/30/18  4:10 PM  Result Value Ref Range   Glucose-Capillary 160 (H) 70 - 99 mg/dL  Glucose, capillary     Status: Abnormal   Collection Time: 10/30/18  9:09 PM  Result Value Ref Range    Glucose-Capillary 207 (H) 70 - 99 mg/dL  Glucose, capillary     Status: Abnormal   Collection Time: 10/31/18  7:59 AM  Result Value Ref Range   Glucose-Capillary 55 (L) 70 - 99 mg/dL  Glucose, capillary     Status: Abnormal   Collection Time: 10/31/18  8:14 AM  Result Value Ref Range   Glucose-Capillary 66 (L) 70 - 99 mg/dL    ABGS No results for input(s): PHART, PO2ART, TCO2, HCO3 in the last 72 hours.  Invalid input(s): PCO2 CULTURES Recent Results (from the past 240 hour(s))  Urine culture     Status: Abnormal   Collection Time: 10/26/18  5:32 AM   Specimen: Urine, Clean Catch  Result Value Ref Range Status   Specimen Description   Final    URINE, CLEAN CATCH Performed at [facility], [address], [place], [place] [address]    Special Requests   Final    NONE Performed at [facility], [address] [place], [place] [address]    [place], SUGGEST RECOLLECTION (A)  Final   Report Status 10/27/2018 FINAL  Final  SARS Coronavirus 2 [facility] order, Performed in [facility] hospital lab) Nasopharyngeal Nasopharyngeal Swab     Status: None   Collection Time: 10/26/18  6:51 AM   Specimen: Nasopharyngeal Swab  Result Value Ref Range Status   SARS Coronavirus 2 NEGATIVE NEGATIVE Final    Comment: (NOTE) If result is NEGATIVE SARS-CoV-2 target nucleic acids are NOT DETECTED. The SARS-CoV-2 RNA is generally detectable in upper and lower  respiratory specimens during the acute phase of infection. The lowest  concentration of SARS-CoV-2 viral copies this assay can detect is 250  copies / mL. A negative result does not preclude SARS-CoV-2 infection  and should not be used as the sole basis for treatment or other  patient management decisions.  A negative result may occur with  improper specimen collection / handling, submission of specimen other  than nasopharyngeal swab, presence of viral mutation(s) within the  areas targeted by this assay, and  inadequate number of viral copies  (<250 copies / mL). A negative result must be combined with clinical  observations, patient history, and epidemiological information. If result is POSITIVE SARS-CoV-2 target nucleic acids are DETECTED. The SARS-CoV-2 RNA is generally detectable in upper and lower  respiratory specimens dur ing the acute phase of infection.  Positive  results are indicative of active infection with SARS-CoV-2.  Clinical  correlation with patient history and other diagnostic information is  necessary to determine patient infection status.  Positive results do  not rule out bacterial infection or co-infection with other viruses. If result is PRESUMPTIVE POSTIVE SARS-CoV-2 nucleic acids MAY BE PRESENT.   A presumptive positive result was obtained on the submitted specimen  and confirmed on repeat testing.  While 2019 novel coronavirus  (SARS-CoV-2) nucleic acids may be present in the submitted sample  additional confirmatory testing may be necessary for epidemiological  and / or clinical management purposes  to differentiate between  SARS-CoV-2 and other Sarbecovirus currently known to infect humans.  If clinically indicated additional testing with an alternate test  methodology [phone]) is advised. The SARS-CoV-2 RNA is generally  detectable in upper and lower respiratory sp ecimens during the acute  phase of infection. The expected result is Negative. Fact Sheet for Patients:  StrictlyIdeas.no Fact Sheet for Healthcare Providers: BankingDealers.co.za This test is not yet approved or cleared by the [place] FDA and has been authorized for detection and/or diagnosis of SARS-CoV-2 by FDA under an Emergency Use Authorization (EUA).  This EUA will remain in effect (meaning this test can be used) for the duration of the COVID-19 declaration under Section 564(b)(1) of the Act, 21 U.S.C. section 360bbb-3(b)(1), unless the  authorization is terminated or revoked sooner. Performed at [facility], [address], [place], [place] [address]   Blood Culture (routine x 2)     Status: None   Collection Time: 10/26/18  7:57 AM   Specimen: BLOOD RIGHT ARM  Result Value Ref Range Status   Specimen Description BLOOD RIGHT ARM  Final   Special Requests   Final    BOTTLES DRAWN AEROBIC AND ANAEROBIC Blood Culture adequate volume   Culture   Final    NO GROWTH 5 DAYS Performed at [facility], [address], [place], [place] [address]    Report Status 10/31/2018 FINAL  Final  Blood Culture (routine x 2)     Status: None   Collection Time: 10/26/18  7:59 AM   Specimen: BLOOD RIGHT ARM  Result Value Ref Range Status   Specimen Description BLOOD RIGHT ARM  Final   Special Requests   Final    BOTTLES DRAWN AEROBIC AND ANAEROBIC Blood Culture adequate volume   Culture   Final    NO GROWTH 5 DAYS Performed at [facility], [address] [place], [place] [address]    Report Status 10/31/2018 FINAL  Final  MRSA PCR Screening     Status: Abnormal   Collection Time: 10/27/18  5:31 AM   Specimen: Nasal Mucosa; Nasopharyngeal  Result Value Ref Range Status   MRSA by PCR POSITIVE (A) NEGATIVE Final    Comment:        The GeneXpert MRSA Assay (FDA approved for NASAL specimens only), is one component of a comprehensive MRSA colonization surveillance program. It is not intended to diagnose MRSA infection nor to guide or monitor treatment for MRSA infections. RESULT CALLED TO, READ BACK BY AND VERIFIED WITH: Terance Ice @1026   08/30/2020KAY Performed at [facility], [address] [place], [place] [address]    Studies/Results: Dg Swallowing Func-speech Pathology  Result Date:  10/30/2018 Objective Swallowing Evaluation: Type of Study: MBS-Modified Barium Swallow Study  Patient Details Name: [name] MRN: [id number] Date of Birth: 1934/01/04 Today's Date: 10/30/2018 Time: SLP Start Time (ACUTE ONLY): 1110  -SLP Stop Time (ACUTE ONLY): 1150 SLP Time Calculation (min) (ACUTE ONLY): 40 min Past Medical History: Past Medical History: Diagnosis Date . Abrasion of arm, left   secondary to fall  . Anemia  . Arthritis  . Breast cancer, left ([place])   er/pr + . Cataract   no surgery as yet,starting of 3 years . Chronic kidney disease   renal stones, one episode of Lithotripsy . Cirrhosis (HCC)   Metavir score 4 . Complication of anesthesia  . Diabetes mellitus  . GERD (gastroesophageal reflux disease)  . Hyperlipidemia  . Hypertension   followed by [place] [facility]. relative to  urology study grp.  Doesn't report ever having a stress test  . Mental disorder  . Multiple falls  . Nocturia  . PONV (postoperative nausea and vomiting)  . Seasonal allergies  . Skin cancer   face- melanoma, 2012- surg. excision   Past Surgical History: Past Surgical History: Procedure Laterality Date . ABDOMINAL HYSTERECTOMY   . BACK SURGERY    x4 back surgery to include rods & screws  . Last in May 2014 . BREAST LUMPECTOMY WITH NEEDLE LOCALIZATION AND AXILLARY SENTINEL LYMPH NODE BX  03/12/2012  Procedure: BREAST LUMPECTOMY WITH NEEDLE LOCALIZATION AND AXILLARY SENTINEL LYMPH NODE BX;  Surgeon: Merrie Roof, MD;  Location: [facility];  Service: General;  Laterality: Left; . BREAST SURGERY    lumpectomy x2/left  . CHOLECYSTECTOMY   . COLONOSCOPY N/A 11/20/2013  Dr.Rourk- diverticula was found in the left colon. o/w normal rectal, colonic and terminal ileal mucosa . ESOPHAGOGASTRODUODENOSCOPY N/A 11/20/2013  Dr.Rourk- abnormal mucosa was found. diffuse snake skinning and friability of the gastric mucosa. patent pylorus. abnormal-appearing first, second and third portion of the duodenum. 42mm gastric nodule at the GE junction. stomach bx= minimal chronic infammation, GE junction bx= polypoid fragments of gastric cardia type mucosa w/mod chronic inflammation and foveolar hyperplasia . EYE SURGERY    cataract surgery bilat  . FRACTURE SURGERY    1975- ORIF- L ankle   . GIVENS CAPSULE STUDY N/A 02/24/2014  Procedure: GIVENS CAPSULE STUDY;  Surgeon: Daneil Dolin, MD;  [facility];  Service: Endoscopy;  Laterality: N/A; . RE-EXCISION OF BREAST LUMPECTOMY  03/22/2012  Procedure: RE-EXCISION OF BREAST LUMPECTOMY;  Surgeon: Merrie Roof, MD;  Location: [place];  Service: General;  Laterality: Left;  re-excision left breast lumpectomy cavity  ER+, PR+ . ROTATOR CUFF REPAIR Bilateral  . SHOULDER SURGERY   . SKIN BIOPSY   . TOTAL KNEE ARTHROPLASTY Right 12/18/2014  Procedure: RIGHT TOTAL KNEE ARTHROPLASTY;  Surgeon: Sydnee Cabal, MD;  Location: [facility];  Service: Orthopedics;  Laterality: Right; . TUBAL LIGATION   HPI: Tarrin Menn  is a 85 y.o. female with recurrent left breast invasive ductal carcinoma,  status post left lumpectomy, h/o DM2, HLD, HTN who presented to the ED with concerns about fevers up to 101, confusion and disorientation. She was admitted with acute hypoxic respiratory failure. She has community-acquired pneumonia. BSE requested  Subjective: "I occasionally get strangled when I am drinking water." Assessment / Plan / Recommendation CHL IP CLINICAL IMPRESSIONS 10/30/2018 Clinical Impression Pt presents with normal oropharyngeal swallow for age. Swallow trigger at the level of the valleculae with thins and occasionally to the pyriforms. Pt with  flash penetration of thins before the swallow with a single teaspoon presentation and when taking barium tablet with thin, which was immediately removed and no residuals remained. Pt noted to have a prominent cricopharyngeus and tiny outpouching near UES which could be a tiny Zenker's diverticulum, however no radiologist present to confirm. Recommend regular textures and thin liquids and Pt to eat/drink slowly. No further SLP services indicated at this time. SLP will sign off. Pt in agreement and appreciative of information.  SLP Visit Diagnosis Dysphagia, oropharyngeal phase (R13.12) Attention  and concentration deficit following -- Frontal lobe and executive function deficit following -- Impact on safety and function No limitations   CHL IP TREATMENT RECOMMENDATION 10/30/2018 Treatment Recommendations No treatment recommended at this time   Prognosis 10/30/2018 Prognosis for Safe Diet Advancement Good Barriers to Reach Goals -- Barriers/Prognosis Comment -- CHL IP DIET RECOMMENDATION 10/30/2018 SLP Diet Recommendations Regular solids;Thin liquid Liquid Administration via Cup;Straw Medication Administration Whole meds with liquid Compensations Slow rate Postural Changes Remain semi-upright after after feeds/meals (Comment);Seated upright at 90 degrees   CHL IP OTHER RECOMMENDATIONS 10/30/2018 Recommended Consults -- Oral Care Recommendations Oral care BID;Patient independent with oral care Other Recommendations Clarify dietary restrictions   CHL IP FOLLOW UP RECOMMENDATIONS 10/30/2018 Follow up Recommendations None   CHL IP FREQUENCY AND DURATION 10/29/2018 Speech Therapy Frequency (ACUTE ONLY) min 2x/week Treatment Duration 1 week      CHL IP ORAL PHASE 10/30/2018 Oral Phase WFL Oral - Pudding Teaspoon -- Oral - Pudding Cup -- Oral - Honey Teaspoon -- Oral - Honey Cup -- Oral - Nectar Teaspoon -- Oral - Nectar Cup -- Oral - Nectar Straw -- Oral - Thin Teaspoon -- Oral - Thin Cup -- Oral - Thin Straw -- Oral - Puree -- Oral - Mech Soft -- Oral - Regular -- Oral - Multi-Consistency -- Oral - Pill -- Oral Phase - Comment --  CHL IP PHARYNGEAL PHASE 10/30/2018 Pharyngeal Phase Impaired Pharyngeal- Pudding Teaspoon -- Pharyngeal -- Pharyngeal- Pudding Cup -- Pharyngeal -- Pharyngeal- Honey Teaspoon -- Pharyngeal -- Pharyngeal- Honey Cup -- Pharyngeal -- Pharyngeal- Nectar Teaspoon -- Pharyngeal -- Pharyngeal- Nectar Cup -- Pharyngeal -- Pharyngeal- Nectar Straw -- Pharyngeal -- Pharyngeal- Thin Teaspoon Delayed swallow initiation-pyriform sinuses;Penetration/Aspiration before swallow Pharyngeal Material enters airway, remains  ABOVE vocal cords then ejected out;Material does not enter airway Pharyngeal- Thin Cup Delayed swallow initiation-vallecula;Delayed swallow initiation-pyriform sinuses Pharyngeal -- Pharyngeal- Thin Straw Delayed swallow initiation-pyriform sinuses Pharyngeal -- Pharyngeal- Puree WFL Pharyngeal -- Pharyngeal- Mechanical Soft -- Pharyngeal -- Pharyngeal- Regular WFL Pharyngeal -- Pharyngeal- Multi-consistency -- Pharyngeal -- Pharyngeal- Pill Penetration/Aspiration during swallow Pharyngeal Material enters airway, remains ABOVE vocal cords then ejected out Pharyngeal Comment --  CHL IP CERVICAL ESOPHAGEAL PHASE 10/30/2018 Cervical Esophageal Phase Impaired Pudding Teaspoon -- Pudding Cup -- Honey Teaspoon -- Honey Cup -- Nectar Teaspoon -- Nectar Cup -- Nectar Straw -- Thin Teaspoon Prominent cricopharyngeal segment;Other (Comment) Thin Cup -- Thin Straw -- Puree -- Mechanical Soft -- Regular -- Multi-consistency -- Pill -- Cervical Esophageal Comment -- Thank you, Genene Churn, [place] PORTER,DABNEY 10/30/2018, 12:45 PM               Medications:  Prior to Admission:  Medications Prior to Admission  Medication Sig Dispense Refill Last Dose  . acyclovir (ZOVIRAX) 400 MG tablet Take 1 tablet (400 mg total) by mouth 2 (two) times daily. 120 tablet 3 10/25/2018 at Unknown time  . ALPHAGAN P 0.1 % SOLN Place 1 drop into both  eyes 2 (two) times daily.    10/25/2018 at Unknown time  . amLODipine (NORVASC) 5 MG tablet Take 5 mg by mouth every morning.    10/25/2018 at Unknown time  . aspirin EC 81 MG tablet Take 81 mg by mouth daily.   10/25/2018 at 1000  . atorvastatin (LIPITOR) 10 MG tablet Take 10 mg by mouth every evening.    10/25/2018 at Unknown time  . Bacillus Coagulans-Inulin (PROBIOTIC FORMULA PO) Take 1 capsule by mouth daily.   10/25/2018 at Unknown time  . BD PEN NEEDLE NANO U/F 32G X 4 MM MISC      . cyanocobalamin (,VITAMIN B-12,) 1000 MCG/ML injection Inject 1,000 mcg into the muscle every  30 (thirty) days.    Past Month at Unknown time  . dorzolamide (TRUSOPT) 2 % ophthalmic solution Place 1 drop into both eyes 2 (two) times daily.   4 10/25/2018 at Unknown time  . EPIPEN 2-PAK 0.3 MG/0.3ML SOAJ injection Inject 0.3 mg into the muscle as needed for anaphylaxis.      . fulvestrant (FASLODEX) 250 MG/5ML injection Inject 500 mg into the muscle every 14 (fourteen) days. One injection each buttock over 1-2 minutes. Warm prior to use.     . gabapentin (NEURONTIN) 600 MG tablet Take 600 mg by mouth daily.    10/25/2018 at Unknown time  . glipiZIDE (GLUCOTROL) 5 MG tablet Take 5 mg by mouth daily before breakfast.    10/25/2018 at Unknown time  . guaiFENesin-dextromethorphan (ROBITUSSIN DM) 100-10 MG/5ML syrup Take 5 mLs by mouth every 4 (four) hours as needed for cough. 118 mL 0   . ipratropium-albuterol (DUONEB) 0.5-2.5 (3) MG/3ML SOLN Take 3 mLs by nebulization 3 (three) times daily. 360 mL 3 10/25/2018 at Unknown time  . Lancets (ONETOUCH DELICA PLUS [id number]) MISC      . latanoprost (XALATAN) 0.005 % ophthalmic solution Place 1 drop into both eyes at bedtime.    10/25/2018 at Unknown time  . methocarbamol (ROBAXIN) 500 MG tablet Take 500 mg by mouth 2 (two) times daily as needed for muscle spasms.   Past Week at Unknown time  . metoprolol succinate (TOPROL-XL) 100 MG 24 hr tablet Take 100 mg by mouth every morning. Take with or immediately following a meal.   10/25/2018 at 100  . mirtazapine (REMERON) 15 MG tablet Take 15 mg by mouth at bedtime.    10/25/2018 at Unknown time  . MYRBETRIQ 50 MG TB24 tablet Take 50 mg by mouth daily.    10/25/2018 at Unknown time  . omeprazole (PRILOSEC) 20 MG capsule Take 20 mg by mouth daily.   10/25/2018 at Unknown time  . ONETOUCH ULTRA test strip      . palbociclib (IBRANCE) 75 MG tablet Take 1 tablet (75 mg total) by mouth daily. Take for 21 days on, 7 days off, repeat every 28 days. 21 tablet 6 10/25/2018 at Unknown time  . Polyethyl Glycol-Propyl Glycol  (SYSTANE ULTRA) 0.4-0.3 % SOLN Place 1-2 drops into both eyes daily as needed (for itching eyes).   10/25/2018 at Unknown time  . tamoxifen (NOLVADEX) 20 MG tablet Take 1 tablet (20 mg total) by mouth daily. 90 tablet 12 10/25/2018 at Unknown time  . torsemide (DEMADEX) 20 MG tablet Take 1 tablet (20 mg total) by mouth daily. 30 tablet 5 10/25/2018 at Unknown time  . TRESIBA FLEXTOUCH 100 UNIT/ML SOPN FlexTouch Pen Inject 0.5 mLs (50 Units total) into the skin daily. 5 pen 5 10/25/2018 at Unknown  time   Scheduled: . acyclovir  400 mg Oral BID  . amLODipine  5 mg Oral q morning - 10a  . aspirin EC  81 mg Oral Q breakfast  . atorvastatin  10 mg Oral QPM  . brimonidine  1 drop Both Eyes BID  . [START ON 11/11/2018] cyanocobalamin  1,000 mcg Intramuscular Q30 days  . dorzolamide  1 drop Both Eyes BID  . gabapentin  600 mg Oral Daily  . heparin  5,000 Units Subcutaneous Q8H  . insulin aspart  0-20 Units Subcutaneous TID WC  . insulin aspart  0-5 Units Subcutaneous QHS  . insulin aspart  6 Units Subcutaneous TID WC  . insulin glargine  50 Units Subcutaneous Daily  . ipratropium-albuterol  3 mL Nebulization TID  . latanoprost  1 drop Both Eyes QHS  . mouth rinse  15 mL Mouth Rinse BID  . metoprolol succinate  100 mg Oral QPC breakfast  . mirabegron ER  25 mg Oral Daily  . mirtazapine  15 mg Oral QHS  . mupirocin ointment  1 application Nasal BID  . palbociclib  75 mg Oral Daily  . pantoprazole  40 mg Oral Daily  . predniSONE  40 mg Oral Q breakfast  . saccharomyces boulardii  250 mg Oral Daily  . sodium chloride flush  3 mL Intravenous Q12H  . tamoxifen  20 mg Oral Daily  . torsemide  20 mg Oral Daily   Continuous: . sodium chloride     EKC:[id number] chloride, acetaminophen **OR** acetaminophen, albuterol, guaiFENesin-dextromethorphan, hydrALAZINE, methocarbamol, ondansetron **OR** ondansetron (ZOFRAN) IV, polyethylene glycol, polyvinyl alcohol, sodium chloride flush  Assesment: She was  admitted with acute hypoxic respiratory failure on the basis of community-acquired pneumonia.  She has some element of chronic bronchitis at baseline.  She is markedly improved.  She has had previous similar episodes and there was concern that she was aspirating but speech evaluation was okay.  She had acute metabolic encephalopathy which has resolved  She has diabetes and her blood sugars been fairly well controlled  She has hypertension which is well controlled Principal Problem:   Acute Respiratory failure with Hypoxia ([place]) Active Problems:   HTN (hypertension)   DM type 2 (diabetes mellitus, type 2) ([place])   Hyperlipidemia   Hypertension   Acute Metabolic Encephalopathy acute   Acute hypoxemic respiratory failure ([place])   Fever   Community acquired pneumonia    Plan: Discharge home.  She will need home health services    LOS: 4 days   Alonza Bogus 10/31/2018, 8:26 AM

## 2018-10-31 NOTE — Discharge Summary (Signed)
Physician Discharge Summary  Patient ID: [name] MRN: [id number] DOB/AGE: [id number] y.o. Primary Care Physician:Keanu Frickey, Percell Miller, MD Admit date: 10/26/2018 Discharge date: 10/31/2018    Discharge Diagnoses:   Principal Problem:   Acute Respiratory failure with Hypoxia ([place]) Active Problems:   HTN (hypertension)   DM type 2 (diabetes mellitus, type 2) ([place])   Hyperlipidemia   Hypertension   Acute Metabolic Encephalopathy acute   Acute hypoxemic respiratory failure (HCC)   Fever   Community acquired pneumonia   Allergies as of 10/31/2018      Reactions   Bee Venom Anaphylaxis, Swelling   Hydromorphone Hcl Other (See Comments)   Agitation, Confusion   Demerol Nausea Only      Medication List    TAKE these medications   acyclovir 400 MG tablet Commonly known as: ZOVIRAX Take 1 tablet (400 mg total) by mouth 2 (two) times daily.   Alphagan P 0.1 % Soln Generic drug: brimonidine Place 1 drop into both eyes 2 (two) times daily.   amLODipine 5 MG tablet Commonly known as: NORVASC Take 5 mg by mouth every morning.   aspirin EC 81 MG tablet Take 81 mg by mouth daily.   atorvastatin 10 MG tablet Commonly known as: LIPITOR Take 10 mg by mouth every evening.   BD Pen Needle Nano U/F 32G X 4 MM Misc Generic drug: Insulin Pen Needle   cyanocobalamin 1000 MCG/ML injection Commonly known as: (VITAMIN B-12) Inject 1,000 mcg into the muscle every 30 (thirty) days.   dorzolamide 2 % ophthalmic solution Commonly known as: TRUSOPT Place 1 drop into both eyes 2 (two) times daily.   EpiPen 2-Pak 0.3 mg/0.3 mL Soaj injection Generic drug: EPINEPHrine Inject 0.3 mg into the muscle as needed for anaphylaxis.   fulvestrant 250 MG/5ML injection Commonly known as: FASLODEX Inject 500 mg into the muscle every 14 (fourteen) days. One injection each buttock over 1-2 minutes. Warm prior to use.   gabapentin 600 MG tablet Commonly known as: NEURONTIN Take 600 mg by  mouth daily.   glipiZIDE 5 MG tablet Commonly known as: GLUCOTROL Take 5 mg by mouth daily before breakfast.   guaiFENesin-dextromethorphan 100-10 MG/5ML syrup Commonly known as: ROBITUSSIN DM Take 5 mLs by mouth every 4 (four) hours as needed for cough.   ipratropium-albuterol 0.5-2.5 (3) MG/3ML Soln Commonly known as: DUONEB Take 3 mLs by nebulization 3 (three) times daily.   latanoprost 0.005 % ophthalmic solution Commonly known as: XALATAN Place 1 drop into both eyes at bedtime.   levofloxacin 500 MG tablet Commonly known as: Levaquin Take 1 tablet (500 mg total) by mouth daily.   methocarbamol 500 MG tablet Commonly known as: ROBAXIN Take 500 mg by mouth 2 (two) times daily as needed for muscle spasms.   metoprolol succinate 100 MG 24 hr tablet Commonly known as: TOPROL-XL Take 100 mg by mouth every morning. Take with or immediately following a meal.   mirtazapine 15 MG tablet Commonly known as: REMERON Take 15 mg by mouth at bedtime.   Myrbetriq 50 MG Tb24 tablet Generic drug: mirabegron ER Take 50 mg by mouth daily.   omeprazole 20 MG capsule Commonly known as: PRILOSEC Take 20 mg by mouth daily.   OneTouch Delica Plus [id number] Misc   OneTouch Ultra test strip Generic drug: glucose blood   palbociclib 75 MG tablet Commonly known as: Ibrance Take 1 tablet (75 mg total) by mouth daily. Take for 21 days on, 7 days off, repeat every 28 days.  predniSONE 10 MG (21) Tbpk tablet Commonly known as: STERAPRED UNI-PAK 21 TAB Take by package instructions   PROBIOTIC FORMULA PO Take 1 capsule by mouth daily.   Systane Ultra 0.4-0.3 % Soln Generic drug: Polyethyl Glycol-Propyl Glycol Place 1-2 drops into both eyes daily as needed (for itching eyes).   tamoxifen 20 MG tablet Commonly known as: NOLVADEX Take 1 tablet (20 mg total) by mouth daily.   torsemide 20 MG tablet Commonly known as: DEMADEX Take 1 tablet (20 mg total) by mouth daily.   Tyler Aas  FlexTouch 100 UNIT/ML Sopn FlexTouch Pen Generic drug: insulin degludec Inject 0.5 mLs (50 Units total) into the skin daily.       Discharged Condition: Improved    Consults: None  Significant Diagnostic Studies: Ct Abdomen Pelvis Wo Contrast  Result Date: 10/26/2018 CLINICAL DATA:  Fever. Right-sided flank pain. Altered mental status. History of left-sided breast cancer. EXAM: CT CHEST, ABDOMEN AND PELVIS WITHOUT CONTRAST TECHNIQUE: Multidetector CT imaging of the chest, abdomen and pelvis was performed following the standard protocol without IV contrast. COMPARISON:  CT chest dated April 11, 2018. Abdominal ultrasound dated June 09, 2014. CT abdomen pelvis dated April 10, 2011. FINDINGS: CT CHEST FINDINGS Cardiovascular: Normal heart size. No pericardial effusion. No thoracic aortic aneurysm. Coronary, aortic arch, and branch vessel atherosclerotic vascular disease. Mediastinum/Nodes: No enlarged mediastinal, hilar, or axillary lymph nodes. Thyroid gland, trachea, and esophagus demonstrate no significant findings. Lungs/Pleura: There are few scattered small faint ground-glass densities in the right upper lobe (series 3, image 39) and left upper lobe (series 3, image 59). Scattered subsegmental atelectasis and scarring. No focal consolidation, pleural effusion, or pneumothorax. Musculoskeletal: Unchanged left breast skin thickening and retroareolar soft tissue fullness. No acute or significant osseous findings. Chronic T12 compression deformity. CT ABDOMEN PELVIS FINDINGS Hepatobiliary: Unchanged cirrhosis. No focal liver abnormality. Prior cholecystectomy. No biliary dilatation. Pancreas: Unremarkable. No pancreatic ductal dilatation or surrounding inflammatory changes. Spleen: Unchanged splenomegaly and 1.7 cm low-density lesion in the anterior spleen. Adrenals/Urinary Tract: The adrenal glands are unremarkable. Unchanged left renal atrophy. No renal calculi or hydronephrosis. The bladder  is unremarkable. Stomach/Bowel: Stomach is within normal limits. Appendix appears normal. No evidence of bowel wall thickening, distention, or inflammatory changes. Moderate left-sided colonic diverticulosis. Vascular/Lymphatic: Minimal aortic atherosclerosis. No enlarged abdominal or pelvic lymph nodes. Reproductive: Status post hysterectomy. No adnexal masses. Other: No abdominal wall hernia or abnormality. Trace perihepatic ascites. No pneumoperitoneum. Musculoskeletal: No acute or significant osseous findings. Prior lumbar decompression and fusion. Chronic L1 and L4 compression deformities. Progressive severe spondylosis at T12-L1. IMPRESSION: Chest: 1. A few scattered small faint ground-glass densities in both upper lobes may be infectious or inflammatory. No consolidative pneumonia. 2. Unchanged left breast skin thickening and retro soft tissue fullness, nonspecific in the setting of prior radiation. 3.  Aortic atherosclerosis (ICD10-I70.0). Abdomen and pelvis: 1.  No acute intra-abdominal process. 2. Unchanged cirrhosis and splenomegaly. Electronically Signed   By: Titus Dubin M.D.   On: 10/26/2018 10:34   Ct Chest Wo Contrast  Result Date: 10/26/2018 CLINICAL DATA:  Fever. Right-sided flank pain. Altered mental status. History of left-sided breast cancer. EXAM: CT CHEST, ABDOMEN AND PELVIS WITHOUT CONTRAST TECHNIQUE: Multidetector CT imaging of the chest, abdomen and pelvis was performed following the standard protocol without IV contrast. COMPARISON:  CT chest dated April 11, 2018. Abdominal ultrasound dated June 09, 2014. CT abdomen pelvis dated April 10, 2011. FINDINGS: CT CHEST FINDINGS Cardiovascular: Normal heart size. No pericardial effusion. No thoracic aortic  aneurysm. Coronary, aortic arch, and branch vessel atherosclerotic vascular disease. Mediastinum/Nodes: No enlarged mediastinal, hilar, or axillary lymph nodes. Thyroid gland, trachea, and esophagus demonstrate no significant  findings. Lungs/Pleura: There are few scattered small faint ground-glass densities in the right upper lobe (series 3, image 39) and left upper lobe (series 3, image 59). Scattered subsegmental atelectasis and scarring. No focal consolidation, pleural effusion, or pneumothorax. Musculoskeletal: Unchanged left breast skin thickening and retroareolar soft tissue fullness. No acute or significant osseous findings. Chronic T12 compression deformity. CT ABDOMEN PELVIS FINDINGS Hepatobiliary: Unchanged cirrhosis. No focal liver abnormality. Prior cholecystectomy. No biliary dilatation. Pancreas: Unremarkable. No pancreatic ductal dilatation or surrounding inflammatory changes. Spleen: Unchanged splenomegaly and 1.7 cm low-density lesion in the anterior spleen. Adrenals/Urinary Tract: The adrenal glands are unremarkable. Unchanged left renal atrophy. No renal calculi or hydronephrosis. The bladder is unremarkable. Stomach/Bowel: Stomach is within normal limits. Appendix appears normal. No evidence of bowel wall thickening, distention, or inflammatory changes. Moderate left-sided colonic diverticulosis. Vascular/Lymphatic: Minimal aortic atherosclerosis. No enlarged abdominal or pelvic lymph nodes. Reproductive: Status post hysterectomy. No adnexal masses. Other: No abdominal wall hernia or abnormality. Trace perihepatic ascites. No pneumoperitoneum. Musculoskeletal: No acute or significant osseous findings. Prior lumbar decompression and fusion. Chronic L1 and L4 compression deformities. Progressive severe spondylosis at T12-L1. IMPRESSION: Chest: 1. A few scattered small faint ground-glass densities in both upper lobes may be infectious or inflammatory. No consolidative pneumonia. 2. Unchanged left breast skin thickening and retro soft tissue fullness, nonspecific in the setting of prior radiation. 3.  Aortic atherosclerosis (ICD10-I70.0). Abdomen and pelvis: 1.  No acute intra-abdominal process. 2. Unchanged cirrhosis and  splenomegaly. Electronically Signed   By: Titus Dubin M.D.   On: 10/26/2018 10:34   Dg Chest Port 1 View  Result Date: 10/26/2018 CLINICAL DATA:  Weakness and confusion. EXAM: PORTABLE CHEST 1 VIEW COMPARISON:  04/15/2018 FINDINGS: Chronic cardiomegaly with left ventricular prominence. Chronic aortic atherosclerosis. Chronic abnormal lung markings. Slight increase in patchy density at the lung bases could be atelectasis or mild basilar pneumonia. No visible effusion. IMPRESSION: Chronic cardiomegaly and lung markings. Slightly more prominent markings at the lung bases than seen previously. This could relate to mild atelectasis or mild basilar pneumonia. Electronically Signed   By: Nelson Chimes M.D.   On: 10/26/2018 07:37   Dg Swallowing Func-speech Pathology  Result Date: 10/30/2018 Objective Swallowing Evaluation: Type of Study: MBS-Modified Barium Swallow Study  Patient Details Name: RYANN PAULI MRN: [id number] Date of Birth: Oct 30, 1933 Today's Date: 10/30/2018 Time: SLP Start Time (ACUTE ONLY): 1110 -SLP Stop Time (ACUTE ONLY): 1150 SLP Time Calculation (min) (ACUTE ONLY): 40 min Past Medical History: Past Medical History: Diagnosis Date . Abrasion of arm, left   secondary to fall  . Anemia  . Arthritis  . Breast cancer, left ([place])   er/pr + . Cataract   no surgery as yet,starting of 3 years . Chronic kidney disease   renal stones, one episode of Lithotripsy . Cirrhosis (HCC)   Metavir score 4 . Complication of anesthesia  . Diabetes mellitus  . GERD (gastroesophageal reflux disease)  . Hyperlipidemia  . Hypertension   followed by [facility]. relative to  urology study grp.  Doesn't report ever having a stress test  . Mental disorder  . Multiple falls  . Nocturia  . PONV (postoperative nausea and vomiting)  . Seasonal allergies  . Skin cancer   face- melanoma, 2012- surg. excision   Past Surgical History: Past Surgical History: Procedure Laterality  Date . ABDOMINAL HYSTERECTOMY   . BACK SURGERY    x4  back surgery to include rods & screws  . Last in May 2014 . BREAST LUMPECTOMY WITH NEEDLE LOCALIZATION AND AXILLARY SENTINEL LYMPH NODE BX  03/12/2012  Procedure: BREAST LUMPECTOMY WITH NEEDLE LOCALIZATION AND AXILLARY SENTINEL LYMPH NODE BX;  Surgeon: Merrie Roof, MD;  Location: [facility];  Service: General;  Laterality: Left; . BREAST SURGERY    lumpectomy x2/left  . CHOLECYSTECTOMY   . COLONOSCOPY N/A 11/20/2013  Dr.Rourk- diverticula was found in the left colon. o/w normal rectal, colonic and terminal ileal mucosa . ESOPHAGOGASTRODUODENOSCOPY N/A 11/20/2013  Dr.Rourk- abnormal mucosa was found. diffuse snake skinning and friability of the gastric mucosa. patent pylorus. abnormal-appearing first, second and third portion of the duodenum. 103mm gastric nodule at the GE junction. stomach bx= minimal chronic infammation, GE junction bx= polypoid fragments of gastric cardia type mucosa w/mod chronic inflammation and foveolar hyperplasia . EYE SURGERY    cataract surgery bilat  . FRACTURE SURGERY    1975- ORIF- L ankle  . GIVENS CAPSULE STUDY N/A 02/24/2014  Procedure: GIVENS CAPSULE STUDY;  Surgeon: Daneil Dolin, MD;  [facility];  Service: Endoscopy;  Laterality: N/A; . RE-EXCISION OF BREAST LUMPECTOMY  03/22/2012  Procedure: RE-EXCISION OF BREAST LUMPECTOMY;  Surgeon: Merrie Roof, MD;  Location: [facility];  Service: General;  Laterality: Left;  re-excision left breast lumpectomy cavity  ER+, PR+ . ROTATOR CUFF REPAIR Bilateral  . SHOULDER SURGERY   . SKIN BIOPSY   . TOTAL KNEE ARTHROPLASTY Right 12/18/2014  Procedure: RIGHT TOTAL KNEE ARTHROPLASTY;  Surgeon: Sydnee Cabal, MD;  Location: [facility];  Service: Orthopedics;  Laterality: Right; . TUBAL LIGATION   HPI: [name]  is a 85 y.o. female with recurrent left breast invasive ductal carcinoma,  status post left lumpectomy, h/o DM2, HLD, HTN who presented to the ED with concerns about fevers up to 101, confusion and disorientation.  She was admitted with acute hypoxic respiratory failure. She has community-acquired pneumonia. BSE requested  Subjective: "I occasionally get strangled when I am drinking water." Assessment / Plan / Recommendation CHL IP CLINICAL IMPRESSIONS 10/30/2018 Clinical Impression Pt presents with normal oropharyngeal swallow for age. Swallow trigger at the level of the valleculae with thins and occasionally to the pyriforms. Pt with flash penetration of thins before the swallow with a single teaspoon presentation and when taking barium tablet with thin, which was immediately removed and no residuals remained. Pt noted to have a prominent cricopharyngeus and tiny outpouching near UES which could be a tiny Zenker's diverticulum, however no radiologist present to confirm. Recommend regular textures and thin liquids and Pt to eat/drink slowly. No further SLP services indicated at this time. SLP will sign off. Pt in agreement and appreciative of information.  SLP Visit Diagnosis Dysphagia, oropharyngeal phase (R13.12) Attention and concentration deficit following -- Frontal lobe and executive function deficit following -- Impact on safety and function No limitations   CHL IP TREATMENT RECOMMENDATION 10/30/2018 Treatment Recommendations No treatment recommended at this time   Prognosis 10/30/2018 Prognosis for Safe Diet Advancement Good Barriers to Reach Goals -- Barriers/Prognosis Comment -- CHL IP DIET RECOMMENDATION 10/30/2018 SLP Diet Recommendations Regular solids;Thin liquid Liquid Administration via Cup;Straw Medication Administration Whole meds with liquid Compensations Slow rate Postural Changes Remain semi-upright after after feeds/meals (Comment);Seated upright at 90 degrees   CHL IP OTHER RECOMMENDATIONS 10/30/2018 Recommended Consults -- Oral Care Recommendations Oral care BID;Patient  independent with oral care Other Recommendations Clarify dietary restrictions   CHL IP FOLLOW UP RECOMMENDATIONS 10/30/2018 Follow up  Recommendations None   CHL IP FREQUENCY AND DURATION 10/29/2018 Speech Therapy Frequency (ACUTE ONLY) min 2x/week Treatment Duration 1 week      CHL IP ORAL PHASE 10/30/2018 Oral Phase WFL Oral - Pudding Teaspoon -- Oral - Pudding Cup -- Oral - Honey Teaspoon -- Oral - Honey Cup -- Oral - Nectar Teaspoon -- Oral - Nectar Cup -- Oral - Nectar Straw -- Oral - Thin Teaspoon -- Oral - Thin Cup -- Oral - Thin Straw -- Oral - Puree -- Oral - Mech Soft -- Oral - Regular -- Oral - Multi-Consistency -- Oral - Pill -- Oral Phase - Comment --  CHL IP PHARYNGEAL PHASE 10/30/2018 Pharyngeal Phase Impaired Pharyngeal- Pudding Teaspoon -- Pharyngeal -- Pharyngeal- Pudding Cup -- Pharyngeal -- Pharyngeal- Honey Teaspoon -- Pharyngeal -- Pharyngeal- Honey Cup -- Pharyngeal -- Pharyngeal- Nectar Teaspoon -- Pharyngeal -- Pharyngeal- Nectar Cup -- Pharyngeal -- Pharyngeal- Nectar Straw -- Pharyngeal -- Pharyngeal- Thin Teaspoon Delayed swallow initiation-pyriform sinuses;Penetration/Aspiration before swallow Pharyngeal Material enters airway, remains ABOVE vocal cords then ejected out;Material does not enter airway Pharyngeal- Thin Cup Delayed swallow initiation-vallecula;Delayed swallow initiation-pyriform sinuses Pharyngeal -- Pharyngeal- Thin Straw Delayed swallow initiation-pyriform sinuses Pharyngeal -- Pharyngeal- Puree WFL Pharyngeal -- Pharyngeal- Mechanical Soft -- Pharyngeal -- Pharyngeal- Regular WFL Pharyngeal -- Pharyngeal- Multi-consistency -- Pharyngeal -- Pharyngeal- Pill Penetration/Aspiration during swallow Pharyngeal Material enters airway, remains ABOVE vocal cords then ejected out Pharyngeal Comment --  CHL IP CERVICAL ESOPHAGEAL PHASE 10/30/2018 Cervical Esophageal Phase Impaired Pudding Teaspoon -- Pudding Cup -- Honey Teaspoon -- Honey Cup -- Nectar Teaspoon -- Nectar Cup -- Nectar Straw -- Thin Teaspoon Prominent cricopharyngeal segment;Other (Comment) Thin Cup -- Thin Straw -- Puree -- Mechanical Soft -- Regular --  Multi-consistency -- Pill -- Cervical Esophageal Comment -- Thank you, Genene Churn, CCC-SLP [phone] PORTER,DABNEY 10/30/2018, 12:45 PM               Lab Results: Basic Metabolic Panel: Recent Labs    10/30/18 0616  NA 139  K 3.9  CL 108  CO2 27  GLUCOSE 111*  BUN 39*  CREATININE 1.62*  CALCIUM 8.4*   Liver Function Tests: Recent Labs    10/30/18 0616  AST 26  ALT 15  ALKPHOS 51  BILITOT 0.6  PROT 6.4*  ALBUMIN 3.1*     CBC: Recent Labs    10/30/18 0616  WBC 2.9*  NEUTROABS 1.5*  HGB 11.1*  HCT 32.6*  MCV 115.2*  PLT 82*    Recent Results (from the past 240 hour(s))  Urine culture     Status: Abnormal   Collection Time: 10/26/18  5:32 AM   Specimen: Urine, Clean Catch  Result Value Ref Range Status   Specimen Description   Final    URINE, CLEAN CATCH Performed at [facility], [address], [place], [place] [address]    Special Requests   Final    NONE Performed at [facility], [address], [place], [place] [address]    [place], SUGGEST RECOLLECTION (A)  Final   Report Status 10/27/2018 FINAL  Final  SARS Coronavirus 2 [facility] order, Performed in [facility] hospital lab) Nasopharyngeal Nasopharyngeal Swab     Status: None   Collection Time: 10/26/18  6:51 AM   Specimen: Nasopharyngeal Swab  Result Value Ref Range Status   SARS Coronavirus 2 NEGATIVE NEGATIVE Final  Comment: (NOTE) If result is NEGATIVE SARS-CoV-2 target nucleic acids are NOT DETECTED. The SARS-CoV-2 RNA is generally detectable in upper and lower  respiratory specimens during the acute phase of infection. The lowest  concentration of SARS-CoV-2 viral copies this assay can detect is 250  copies / mL. A negative result does not preclude SARS-CoV-2 infection  and should not be used as the sole basis for treatment or other  patient management decisions.  A negative result may occur with  improper specimen collection / handling, submission of  specimen other  than nasopharyngeal swab, presence of viral mutation(s) within the  areas targeted by this assay, and inadequate number of viral copies  (<250 copies / mL). A negative result must be combined with clinical  observations, patient history, and epidemiological information. If result is POSITIVE SARS-CoV-2 target nucleic acids are DETECTED. The SARS-CoV-2 RNA is generally detectable in upper and lower  respiratory specimens dur ing the acute phase of infection.  Positive  results are indicative of active infection with SARS-CoV-2.  Clinical  correlation with patient history and other diagnostic information is  necessary to determine patient infection status.  Positive results do  not rule out bacterial infection or co-infection with other viruses. If result is PRESUMPTIVE POSTIVE SARS-CoV-2 nucleic acids MAY BE PRESENT.   A presumptive positive result was obtained on the submitted specimen  and confirmed on repeat testing.  While 2019 novel coronavirus  (SARS-CoV-2) nucleic acids may be present in the submitted sample  additional confirmatory testing may be necessary for epidemiological  and / or clinical management purposes  to differentiate between  SARS-CoV-2 and other Sarbecovirus currently known to infect humans.  If clinically indicated additional testing with an alternate test  methodology [phone]) is advised. The SARS-CoV-2 RNA is generally  detectable in upper and lower respiratory sp ecimens during the acute  phase of infection. The expected result is Negative. Fact Sheet for Patients:  StrictlyIdeas.no Fact Sheet for Healthcare Providers: BankingDealers.co.za This test is not yet approved or cleared by the [place] FDA and has been authorized for detection and/or diagnosis of SARS-CoV-2 by FDA under an Emergency Use Authorization (EUA).  This EUA will remain in effect (meaning this test can be used) for the  duration of the COVID-19 declaration under Section 564(b)(1) of the Act, 21 U.S.C. section 360bbb-3(b)(1), unless the authorization is terminated or revoked sooner. Performed at [facility], [address], [place], [place] [address]   Blood Culture (routine x 2)     Status: None   Collection Time: 10/26/18  7:57 AM   Specimen: BLOOD RIGHT ARM  Result Value Ref Range Status   Specimen Description BLOOD RIGHT ARM  Final   Special Requests   Final    BOTTLES DRAWN AEROBIC AND ANAEROBIC Blood Culture adequate volume   Culture   Final    NO GROWTH 5 DAYS Performed at [facility], [address] [place], [place] [address]    Report Status 10/31/2018 FINAL  Final  Blood Culture (routine x 2)     Status: None   Collection Time: 10/26/18  7:59 AM   Specimen: BLOOD RIGHT ARM  Result Value Ref Range Status   Specimen Description BLOOD RIGHT ARM  Final   Special Requests   Final    BOTTLES DRAWN AEROBIC AND ANAEROBIC Blood Culture adequate volume   Culture   Final    NO GROWTH 5 DAYS Performed at [facility], [address] [place],  [address]  Report Status 10/31/2018 FINAL  Final  MRSA PCR Screening     Status: Abnormal   Collection Time: 10/27/18  5:31 AM   Specimen: Nasal Mucosa; Nasopharyngeal  Result Value Ref Range Status   MRSA by PCR POSITIVE (A) NEGATIVE Final    Comment:        The GeneXpert MRSA Assay (FDA approved for NASAL specimens only), is one component of a comprehensive MRSA colonization surveillance program. It is not intended to diagnose MRSA infection nor to guide or monitor treatment for MRSA infections. RESULT CALLED TO, READ BACK BY AND VERIFIED WITH: Terance Ice @1026   08/30/2020KAY Performed at [facility], [address], [place], [place] [address]      Hospital Course: She came to the hospital because of altered mental status fever and abnormal chest CT.  She was treated for community-acquired pneumonia.  She was hypoxic but that  resolved.  She had evaluation by physical therapy and Occupational Therapy and it was felt that she could be treated at home with home health services.  She had speech evaluation because of previous similar episodes but she did not have trouble with swallowing and was not high risk of aspiration.  She improved and was much better by the time of discharge.  She is off oxygen.  Her chest has cleared.  She is still short of breath with exertion  Discharge Exam: Blood pressure (!) 158/79, pulse 66, temperature 98.2 F (36.8 C), temperature source Oral, resp. rate 20, height 5' (1.524 m), weight 91.9 kg, SpO2 96 %. She is awake and alert.  Chest is clear.  Heart is regular.  Abdomen soft.  Disposition: Home with home health services  Discharge Instructions    Face-to-face encounter (required for Medicare/Medicaid patients)   Complete by: As directed    I Alonza Bogus certify that this patient is under my care and that I, or a nurse practitioner or physician's assistant working with me, had a face-to-face encounter that meets the physician face-to-face encounter requirements with this patient on 10/31/2018. The encounter with the patient was in whole, or in part for the following medical condition(s) which is the primary reason for home health care (List medical condition): respiratory failure   The encounter with the patient was in whole, or in part, for the following medical condition, which is the primary reason for home health care: respiratory failure   I certify that, based on my findings, the following services are medically necessary home health services:  Nursing Physical therapy     Reason for Medically Necessary Home Health Services: Skilled Nursing- Change/Decline in Patient Status   My clinical findings support the need for the above services: Unable to leave home safely without assistance and/or assistive device   Further, I certify that my clinical findings support that this patient is  homebound due to: Shortness of Breath with activity   Home Health   Complete by: As directed    To provide the following care/treatments:  PT RN        Follow-up Information    Sinda Du, MD Follow up on 11/15/2018.   Specialty: Pulmonary Disease Why: Friday at 11:00 for your hospital follow up appointment Contact information: [address] [place] [address] [phone]           Signed: Alonza Bogus   10/31/2018, 8:29 AM

## 2018-10-31 NOTE — Plan of Care (Signed)

## 2018-11-05 DIAGNOSIS — D631 Anemia in chronic kidney disease: Secondary | ICD-10-CM | POA: Diagnosis not present

## 2018-11-05 DIAGNOSIS — G9341 Metabolic encephalopathy: Secondary | ICD-10-CM | POA: Diagnosis not present

## 2018-11-05 DIAGNOSIS — N184 Chronic kidney disease, stage 4 (severe): Secondary | ICD-10-CM | POA: Diagnosis not present

## 2018-11-05 DIAGNOSIS — I129 Hypertensive chronic kidney disease with stage 1 through stage 4 chronic kidney disease, or unspecified chronic kidney disease: Secondary | ICD-10-CM | POA: Diagnosis not present

## 2018-11-05 DIAGNOSIS — J189 Pneumonia, unspecified organism: Secondary | ICD-10-CM | POA: Diagnosis not present

## 2018-11-05 DIAGNOSIS — E1122 Type 2 diabetes mellitus with diabetic chronic kidney disease: Secondary | ICD-10-CM | POA: Diagnosis not present

## 2018-11-05 DIAGNOSIS — R531 Weakness: Secondary | ICD-10-CM | POA: Diagnosis not present

## 2018-11-05 DIAGNOSIS — C50012 Malignant neoplasm of nipple and areola, left female breast: Secondary | ICD-10-CM | POA: Diagnosis not present

## 2018-11-05 DIAGNOSIS — E1165 Type 2 diabetes mellitus with hyperglycemia: Secondary | ICD-10-CM | POA: Diagnosis not present

## 2018-11-05 DIAGNOSIS — J9601 Acute respiratory failure with hypoxia: Secondary | ICD-10-CM | POA: Diagnosis not present

## 2018-11-06 MED FILL — IBRANCE 75 MG TABS: 75 | 28 days supply | Qty: 21 | Fill #1

## 2018-11-11 DIAGNOSIS — C50012 Malignant neoplasm of nipple and areola, left female breast: Secondary | ICD-10-CM | POA: Diagnosis not present

## 2018-11-11 DIAGNOSIS — D631 Anemia in chronic kidney disease: Secondary | ICD-10-CM | POA: Diagnosis not present

## 2018-11-11 DIAGNOSIS — J189 Pneumonia, unspecified organism: Secondary | ICD-10-CM | POA: Diagnosis not present

## 2018-11-11 DIAGNOSIS — G9341 Metabolic encephalopathy: Secondary | ICD-10-CM | POA: Diagnosis not present

## 2018-11-11 DIAGNOSIS — R531 Weakness: Secondary | ICD-10-CM | POA: Diagnosis not present

## 2018-11-11 DIAGNOSIS — E1165 Type 2 diabetes mellitus with hyperglycemia: Secondary | ICD-10-CM | POA: Diagnosis not present

## 2018-11-11 DIAGNOSIS — I129 Hypertensive chronic kidney disease with stage 1 through stage 4 chronic kidney disease, or unspecified chronic kidney disease: Secondary | ICD-10-CM | POA: Diagnosis not present

## 2018-11-11 DIAGNOSIS — N184 Chronic kidney disease, stage 4 (severe): Secondary | ICD-10-CM | POA: Diagnosis not present

## 2018-11-11 DIAGNOSIS — E1122 Type 2 diabetes mellitus with diabetic chronic kidney disease: Secondary | ICD-10-CM | POA: Diagnosis not present

## 2018-11-11 DIAGNOSIS — J9601 Acute respiratory failure with hypoxia: Secondary | ICD-10-CM | POA: Diagnosis not present

## 2018-11-12 DIAGNOSIS — N184 Chronic kidney disease, stage 4 (severe): Secondary | ICD-10-CM | POA: Diagnosis not present

## 2018-11-12 DIAGNOSIS — R531 Weakness: Secondary | ICD-10-CM | POA: Diagnosis not present

## 2018-11-12 DIAGNOSIS — G9341 Metabolic encephalopathy: Secondary | ICD-10-CM | POA: Diagnosis not present

## 2018-11-12 DIAGNOSIS — R69 Illness, unspecified: Secondary | ICD-10-CM | POA: Diagnosis not present

## 2018-11-12 DIAGNOSIS — C50012 Malignant neoplasm of nipple and areola, left female breast: Secondary | ICD-10-CM | POA: Diagnosis not present

## 2018-11-12 DIAGNOSIS — E1122 Type 2 diabetes mellitus with diabetic chronic kidney disease: Secondary | ICD-10-CM | POA: Diagnosis not present

## 2018-11-12 DIAGNOSIS — J189 Pneumonia, unspecified organism: Secondary | ICD-10-CM | POA: Diagnosis not present

## 2018-11-12 DIAGNOSIS — D631 Anemia in chronic kidney disease: Secondary | ICD-10-CM | POA: Diagnosis not present

## 2018-11-12 DIAGNOSIS — I129 Hypertensive chronic kidney disease with stage 1 through stage 4 chronic kidney disease, or unspecified chronic kidney disease: Secondary | ICD-10-CM | POA: Diagnosis not present

## 2018-11-12 DIAGNOSIS — J9601 Acute respiratory failure with hypoxia: Secondary | ICD-10-CM | POA: Diagnosis not present

## 2018-11-12 DIAGNOSIS — E1165 Type 2 diabetes mellitus with hyperglycemia: Secondary | ICD-10-CM | POA: Diagnosis not present

## 2018-11-13 DIAGNOSIS — E1122 Type 2 diabetes mellitus with diabetic chronic kidney disease: Secondary | ICD-10-CM | POA: Diagnosis not present

## 2018-11-13 DIAGNOSIS — J189 Pneumonia, unspecified organism: Secondary | ICD-10-CM | POA: Diagnosis not present

## 2018-11-13 DIAGNOSIS — G9341 Metabolic encephalopathy: Secondary | ICD-10-CM | POA: Diagnosis not present

## 2018-11-13 DIAGNOSIS — N184 Chronic kidney disease, stage 4 (severe): Secondary | ICD-10-CM | POA: Diagnosis not present

## 2018-11-13 DIAGNOSIS — J9601 Acute respiratory failure with hypoxia: Secondary | ICD-10-CM | POA: Diagnosis not present

## 2018-11-13 DIAGNOSIS — R531 Weakness: Secondary | ICD-10-CM | POA: Diagnosis not present

## 2018-11-13 DIAGNOSIS — C50012 Malignant neoplasm of nipple and areola, left female breast: Secondary | ICD-10-CM | POA: Diagnosis not present

## 2018-11-13 DIAGNOSIS — D631 Anemia in chronic kidney disease: Secondary | ICD-10-CM | POA: Diagnosis not present

## 2018-11-13 DIAGNOSIS — E1165 Type 2 diabetes mellitus with hyperglycemia: Secondary | ICD-10-CM | POA: Diagnosis not present

## 2018-11-13 DIAGNOSIS — I129 Hypertensive chronic kidney disease with stage 1 through stage 4 chronic kidney disease, or unspecified chronic kidney disease: Secondary | ICD-10-CM | POA: Diagnosis not present

## 2018-11-14 DIAGNOSIS — E1165 Type 2 diabetes mellitus with hyperglycemia: Secondary | ICD-10-CM | POA: Diagnosis not present

## 2018-11-14 DIAGNOSIS — G9341 Metabolic encephalopathy: Secondary | ICD-10-CM | POA: Diagnosis not present

## 2018-11-14 DIAGNOSIS — N184 Chronic kidney disease, stage 4 (severe): Secondary | ICD-10-CM | POA: Diagnosis not present

## 2018-11-14 DIAGNOSIS — E1122 Type 2 diabetes mellitus with diabetic chronic kidney disease: Secondary | ICD-10-CM | POA: Diagnosis not present

## 2018-11-14 DIAGNOSIS — J189 Pneumonia, unspecified organism: Secondary | ICD-10-CM | POA: Diagnosis not present

## 2018-11-14 DIAGNOSIS — R531 Weakness: Secondary | ICD-10-CM | POA: Diagnosis not present

## 2018-11-14 DIAGNOSIS — D631 Anemia in chronic kidney disease: Secondary | ICD-10-CM | POA: Diagnosis not present

## 2018-11-14 DIAGNOSIS — J9601 Acute respiratory failure with hypoxia: Secondary | ICD-10-CM | POA: Diagnosis not present

## 2018-11-14 DIAGNOSIS — I129 Hypertensive chronic kidney disease with stage 1 through stage 4 chronic kidney disease, or unspecified chronic kidney disease: Secondary | ICD-10-CM | POA: Diagnosis not present

## 2018-11-14 DIAGNOSIS — C50012 Malignant neoplasm of nipple and areola, left female breast: Secondary | ICD-10-CM | POA: Diagnosis not present

## 2018-11-15 ENCOUNTER — Other Ambulatory Visit: Payer: Self-pay | Admitting: Oncology

## 2018-11-15 DIAGNOSIS — M545 Low back pain: Secondary | ICD-10-CM | POA: Diagnosis not present

## 2018-11-15 DIAGNOSIS — J189 Pneumonia, unspecified organism: Secondary | ICD-10-CM | POA: Diagnosis not present

## 2018-11-15 DIAGNOSIS — I10 Essential (primary) hypertension: Secondary | ICD-10-CM | POA: Diagnosis not present

## 2018-11-15 DIAGNOSIS — E1165 Type 2 diabetes mellitus with hyperglycemia: Secondary | ICD-10-CM | POA: Diagnosis not present

## 2018-11-18 DIAGNOSIS — R69 Illness, unspecified: Secondary | ICD-10-CM | POA: Diagnosis not present

## 2018-11-19 ENCOUNTER — Emergency Department (HOSPITAL_COMMUNITY)
Admission: EM | Admit: 2018-11-19 | Discharge: 2018-11-19 | Disposition: A | Discharge status: Home / Self Care | Payer: Medicare HMO | Attending: Emergency Medicine | Admitting: Emergency Medicine

## 2018-11-19 ENCOUNTER — Emergency Department (HOSPITAL_COMMUNITY): Payer: Medicare HMO

## 2018-11-19 ENCOUNTER — Encounter (HOSPITAL_COMMUNITY): Payer: Self-pay

## 2018-11-19 ENCOUNTER — Other Ambulatory Visit: Payer: Self-pay

## 2018-11-19 DIAGNOSIS — I129 Hypertensive chronic kidney disease with stage 1 through stage 4 chronic kidney disease, or unspecified chronic kidney disease: Secondary | ICD-10-CM | POA: Insufficient documentation

## 2018-11-19 DIAGNOSIS — Z79899 Other long term (current) drug therapy: Secondary | ICD-10-CM | POA: Diagnosis not present

## 2018-11-19 DIAGNOSIS — R5381 Other malaise: Secondary | ICD-10-CM | POA: Diagnosis not present

## 2018-11-19 DIAGNOSIS — R4020 Unspecified coma: Secondary | ICD-10-CM | POA: Diagnosis not present

## 2018-11-19 DIAGNOSIS — R0902 Hypoxemia: Secondary | ICD-10-CM | POA: Diagnosis not present

## 2018-11-19 DIAGNOSIS — N189 Chronic kidney disease, unspecified: Secondary | ICD-10-CM | POA: Diagnosis not present

## 2018-11-19 DIAGNOSIS — G253 Myoclonus: Secondary | ICD-10-CM

## 2018-11-19 DIAGNOSIS — Z20828 Contact with and (suspected) exposure to other viral communicable diseases: Secondary | ICD-10-CM | POA: Diagnosis not present

## 2018-11-19 DIAGNOSIS — E1122 Type 2 diabetes mellitus with diabetic chronic kidney disease: Secondary | ICD-10-CM | POA: Insufficient documentation

## 2018-11-19 DIAGNOSIS — E1165 Type 2 diabetes mellitus with hyperglycemia: Secondary | ICD-10-CM | POA: Diagnosis not present

## 2018-11-19 DIAGNOSIS — E1140 Type 2 diabetes mellitus with diabetic neuropathy, unspecified: Secondary | ICD-10-CM | POA: Diagnosis not present

## 2018-11-19 DIAGNOSIS — Z7982 Long term (current) use of aspirin: Secondary | ICD-10-CM | POA: Insufficient documentation

## 2018-11-19 DIAGNOSIS — J189 Pneumonia, unspecified organism: Secondary | ICD-10-CM | POA: Diagnosis not present

## 2018-11-19 DIAGNOSIS — R531 Weakness: Secondary | ICD-10-CM | POA: Diagnosis not present

## 2018-11-19 DIAGNOSIS — Z7984 Long term (current) use of oral hypoglycemic drugs: Secondary | ICD-10-CM | POA: Insufficient documentation

## 2018-11-19 LAB — CBC WITH DIFFERENTIAL/PLATELET
Abs Immature Granulocytes: 0.01 10*3/uL (ref 0.00–0.07)
Basophils Absolute: 0 10*3/uL (ref 0.0–0.1)
Basophils Relative: 1 %
Eosinophils Absolute: 0.1 10*3/uL (ref 0.0–0.5)
Eosinophils Relative: 5 %
HCT: 35.4 % — ABNORMAL LOW (ref 36.0–46.0)
Hemoglobin: 12 g/dL (ref 12.0–15.0)
Immature Granulocytes: 1 %
Lymphocytes Relative: 42 %
Lymphs Abs: 0.9 10*3/uL (ref 0.7–4.0)
MCH: 39 pg — ABNORMAL HIGH (ref 26.0–34.0)
MCHC: 33.9 g/dL (ref 30.0–36.0)
MCV: 114.9 fL — ABNORMAL HIGH (ref 80.0–100.0)
Monocytes Absolute: 0.1 10*3/uL (ref 0.1–1.0)
Monocytes Relative: 5 %
Neutro Abs: 1 10*3/uL — ABNORMAL LOW (ref 1.7–7.7)
Neutrophils Relative %: 46 %
Platelets: 76 10*3/uL — ABNORMAL LOW (ref 150–400)
RBC: 3.08 MIL/uL — ABNORMAL LOW (ref 3.87–5.11)
RDW: 14.6 % (ref 11.5–15.5)
WBC: 2.2 10*3/uL — ABNORMAL LOW (ref 4.0–10.5)
nRBC: 0 % (ref 0.0–0.2)

## 2018-11-19 LAB — BASIC METABOLIC PANEL
Anion gap: 11 (ref 5–15)
BUN: 30 mg/dL — ABNORMAL HIGH (ref 8–23)
CO2: 27 mmol/L (ref 22–32)
Calcium: 8.6 mg/dL — ABNORMAL LOW (ref 8.9–10.3)
Chloride: 97 mmol/L — ABNORMAL LOW (ref 98–111)
Creatinine, Ser: 2.33 mg/dL — ABNORMAL HIGH (ref 0.44–1.00)
GFR calc Af Amer: 22 mL/min — ABNORMAL LOW (ref >60)
GFR calc non Af Amer: 19 mL/min — ABNORMAL LOW (ref >60)
Glucose, Bld: 234 mg/dL — ABNORMAL HIGH (ref 70–99)
Potassium: 3.7 mmol/L (ref 3.5–5.1)
Sodium: 135 mmol/L (ref 135–145)

## 2018-11-19 LAB — URINALYSIS, ROUTINE W REFLEX MICROSCOPIC
Bacteria, UA: NONE SEEN
Bilirubin Urine: NEGATIVE
Glucose, UA: >=500 mg/dL — AB
Hgb urine dipstick: NEGATIVE
Ketones, ur: NEGATIVE mg/dL
Nitrite: NEGATIVE
Protein, ur: NEGATIVE mg/dL
Specific Gravity, Urine: 1.009 (ref 1.005–1.030)
pH: 6 (ref 5.0–8.0)

## 2018-11-19 LAB — SARS CORONAVIRUS 2 (TAT 6-24 HRS): SARS Coronavirus 2: NEGATIVE

## 2018-11-19 LAB — MAGNESIUM: Magnesium: 2 mg/dL (ref 1.7–2.4)

## 2018-11-19 MED ORDER — LORAZEPAM 2 MG/ML IJ SOLN
1.0000 mg | Freq: Once | INTRAMUSCULAR | Status: AC
  Administered 2018-11-19: 1 mg via INTRAVENOUS
  Filled 2018-11-19: qty 1

## 2018-11-19 MED ORDER — CLONAZEPAM 0.5 MG PO TABS: 0.5000 mg | ORAL_TABLET | Freq: Two times a day (BID) | ORAL | 0 refills | Status: DC | PRN

## 2018-11-19 NOTE — ED Notes (Signed)
Updated samantha,daughter

## 2018-11-19 NOTE — ED Provider Notes (Signed)
[facility] EMERGENCY DEPARTMENT Provider Note   CSN: [id number] Arrival date & time: 11/19/18  1100     History   Chief Complaint Chief Complaint  Patient presents with   Weakness    HPI [name] is a 85 y.o. female.  HPI   85 year old female with intermittent jerking lower extremities.  Onset this morning.  She is unable to control it.  No pain per se, but somewhat frustrating.  Interfering with her ability to ambulate.  Normally walks with a walker.  She had symptoms like this about a month ago which resolved.  No other acute complaints.  Past Medical History:  Diagnosis Date   Abrasion of arm, left    secondary to fall    Anemia    Arthritis    Breast cancer, left (HCC)    er/pr +   Cataract    no surgery as yet,starting of 3 years   Chronic kidney disease    renal stones, one episode of Lithotripsy   Cirrhosis ([place])    Metavir score 4   Complication of anesthesia    Diabetes mellitus    GERD (gastroesophageal reflux disease)    Hyperlipidemia    Hypertension    followed by [place] [facility]. relative to  urology study grp.  Doesn't report ever having a stress test    Mental disorder    Multiple falls    Nocturia    PONV (postoperative nausea and vomiting)    Seasonal allergies    Skin cancer    face- melanoma, 2012- surg. excision     Patient Active Problem List   Diagnosis Date Noted   Community acquired pneumonia 10/27/2018   Fever 10/26/2018   Acute Respiratory failure with Hypoxia ([place]) 10/26/2018   Acute hypoxemic respiratory failure ([place]) 04/15/2018   Acute on chronic diastolic CHF (congestive heart failure) ([place]) 04/15/2018   Malignant neoplasm of overlapping sites of left breast in female, estrogen receptor positive ([place]) 04/03/2018   Recurrent breast adenocarcinoma, left ([place]) 04/03/2018   Altered mental status 08/18/2017   Acute encephalopathy 09/73/5329   Acute Metabolic Encephalopathy acute 08/17/2017    Encephalopathy 07/25/2017   Diabetes mellitus ([place]) 09/02/2015   Anemia due to multiple mechanisms 02/17/2015   Chronic LBP 02/17/2015   Impaired renal function 02/17/2015   Abducens nerve weakness 02/17/2015   S/P knee replacement 12/18/2014   Osteoarthritis of right knee 12/18/2014   IDA (iron deficiency anemia) 10/29/2013   Vitamin B12 deficiency anemia 09/09/2013   Anemia, iron deficiency 09/09/2013   Lumbago 08/06/2013   Difficulty in walking(719.7) 08/06/2013   Osteopenia 03/11/2013   Malignant neoplasm of lower-inner quadrant of left breast in female, estrogen receptor positive ([place]) 03/10/2013   Bilateral leg weakness 09/23/2012   Acute renal failure ([place]) 07/25/2012   Dehydration 07/25/2012   DM type 2, uncontrolled, with neuropathy ([place]) 07/25/2012   DM (diabetes mellitus), type 2, uncontrolled ([place]) 07/25/2012   Essential hypertension, benign 07/25/2012   Generalized weakness 07/25/2012   Hyperlipidemia    Chronic kidney disease    Hypertension    GERD (gastroesophageal reflux disease)    CAP (community acquired pneumonia) 04/19/2011   Nausea and vomiting 04/19/2011   HTN (hypertension) 04/19/2011   DM type 2 (diabetes mellitus, type 2) ([place]) 04/19/2011   Arthritis 04/19/2011   Past Surgical History:  Procedure Laterality Date   ABDOMINAL HYSTERECTOMY     BACK SURGERY     x4 back surgery to include rods & screws  . Last  in May 2014   BREAST LUMPECTOMY WITH NEEDLE LOCALIZATION AND AXILLARY SENTINEL LYMPH NODE BX  03/12/2012   Procedure: BREAST LUMPECTOMY WITH NEEDLE LOCALIZATION AND AXILLARY SENTINEL LYMPH NODE BX;  Surgeon: Merrie Roof, MD;  Location: [place];  Service: General;  Laterality: Left;   BREAST SURGERY     lumpectomy x2/left    CHOLECYSTECTOMY     COLONOSCOPY N/A 11/20/2013   Dr.Rourk- diverticula was found in the left colon. o/w normal rectal, colonic and terminal ileal mucosa   ESOPHAGOGASTRODUODENOSCOPY N/A  11/20/2013   Dr.Rourk- abnormal mucosa was found. diffuse snake skinning and friability of the gastric mucosa. patent pylorus. abnormal-appearing first, second and third portion of the duodenum. 64mm gastric nodule at the GE junction. stomach bx= minimal chronic infammation, GE junction bx= polypoid fragments of gastric cardia type mucosa w/mod chronic inflammation and foveolar hyperplasia   EYE SURGERY     cataract surgery bilat    FRACTURE SURGERY     1975- ORIF- L ankle    GIVENS CAPSULE STUDY N/A 02/24/2014   Procedure: GIVENS CAPSULE STUDY;  Surgeon: Daneil Dolin, MD;  [facility];  Service: Endoscopy;  Laterality: N/A;   RE-EXCISION OF BREAST LUMPECTOMY  03/22/2012   Procedure: RE-EXCISION OF BREAST LUMPECTOMY;  Surgeon: Merrie Roof, MD;  Location: [place];  Service: General;  Laterality: Left;  re-excision left breast lumpectomy cavity  ER+, PR+   ROTATOR CUFF REPAIR Bilateral    SHOULDER SURGERY     SKIN BIOPSY     TOTAL KNEE ARTHROPLASTY Right 12/18/2014   Procedure: RIGHT TOTAL KNEE ARTHROPLASTY;  Surgeon: Sydnee Cabal, MD;  [facility];  Service: Orthopedics;  Laterality: Right;   TUBAL LIGATION      OB History    Gravida  2   Para  2   Term  2   Preterm      AB      Living  2     SAB      TAB      Ectopic      Multiple      Live Births             Home Medications    Prior to Admission medications   Medication Sig Start Date End Date Taking? Authorizing Provider  acyclovir (ZOVIRAX) 400 MG tablet Take 1 tablet (400 mg total) by mouth 2 (two) times daily. 10/08/18  Yes Magrinat, Virgie Dad, MD  ALPHAGAN P 0.1 % SOLN Place 1 drop into both eyes 2 (two) times daily.  08/01/16  Yes [provider]  amLODipine (NORVASC) 5 MG tablet Take 5 mg by mouth every morning.    Yes [provider]  aspirin EC 81 MG tablet Take 81 mg by mouth daily.   Yes [provider]  atorvastatin (LIPITOR) 10  MG tablet Take 10 mg by mouth every evening.    Yes [provider]  Bacillus Coagulans-Inulin (PROBIOTIC FORMULA PO) Take 1 capsule by mouth daily.   Yes [provider]  BD PEN NEEDLE NANO U/F 32G X 4 MM MISC  06/21/18  Yes [provider]  cyanocobalamin (,VITAMIN B-12,) 1000 MCG/ML injection Inject 1,000 mcg into the muscle every 30 (thirty) days.  05/14/17  Yes [provider]  dorzolamide (TRUSOPT) 2 % ophthalmic solution Place 1 drop into both eyes 2 (two) times daily.    Yes [provider]  EPIPEN 2-PAK 0.3 MG/0.3ML SOAJ injection Inject  0.3 mg into the muscle as needed for anaphylaxis.    Yes [provider]  fulvestrant (FASLODEX) 250 MG/5ML injection Inject 500 mg into the muscle every 14 (fourteen) days. One injection each buttock over 1-2 minutes. Warm prior to use.   Yes [provider]  gabapentin (NEURONTIN) 600 MG tablet Take 600 mg by mouth daily.    Yes [provider]  glipiZIDE (GLUCOTROL) 5 MG tablet Take 5 mg by mouth daily before breakfast.  07/31/16  Yes [provider]  guaiFENesin-dextromethorphan (ROBITUSSIN DM) 100-10 MG/5ML syrup Take 5 mLs by mouth every 4 (four) hours as needed for cough. 04/23/18  Yes Sinda Du, MD  ipratropium-albuterol (DUONEB) 0.5-2.5 (3) MG/3ML SOLN Take 3 mLs by nebulization 3 (three) times daily. 04/23/18  Yes Sinda Du, MD  Lancets (ONETOUCH DELICA PLUS [id number]) [place]  06/18/18  Yes [provider]  latanoprost (XALATAN) 0.005 % ophthalmic solution Place 1 drop into both eyes at bedtime.  01/17/18  Yes [provider]  methocarbamol (ROBAXIN) 500 MG tablet Take 500 mg by mouth 2 (two) times daily as needed for muscle spasms.   Yes [provider]  metoprolol succinate (TOPROL-XL) 100 MG 24 hr tablet Take 100 mg by mouth every morning. Take with or immediately following a meal.   Yes [provider]  mirtazapine (REMERON) 15  MG tablet Take 15 mg by mouth at bedtime.  08/04/15  Yes [provider]  MYRBETRIQ 50 MG TB24 tablet Take 50 mg by mouth daily.  01/15/18  Yes [provider]  omeprazole (PRILOSEC) 20 MG capsule Take 20 mg by mouth daily.   Yes [provider]  Donald Siva test strip  08/23/18  Yes [provider]  palbociclib (IBRANCE) 75 MG tablet Take 1 tablet (75 mg total) by mouth daily. Take for 21 days on, 7 days off, repeat every 28 days. 10/08/18  Yes Magrinat, Virgie Dad, MD  Polyethyl Glycol-Propyl Glycol (SYSTANE ULTRA) 0.4-0.3 % SOLN Place 1-2 drops into both eyes daily as needed (for itching eyes).   Yes [provider]  torsemide (DEMADEX) 20 MG tablet Take 1 tablet (20 mg total) by mouth daily. 08/21/17  Yes Sinda Du, MD  TRESIBA FLEXTOUCH 100 UNIT/ML SOPN FlexTouch Pen Inject 0.5 mLs (50 Units total) into the skin daily. 04/23/18  Yes Sinda Du, MD  levofloxacin (LEVAQUIN) 500 MG tablet Take 1 tablet (500 mg total) by mouth daily. Patient not taking: Reported on 11/19/2018 10/31/18   Sinda Du, MD  predniSONE (STERAPRED UNI-PAK 21 TAB) 10 MG (21) TBPK tablet Take by package instructions Patient not taking: Reported on 11/19/2018 10/31/18   Sinda Du, MD   Family History Family History  Problem Relation Age of Onset   Stroke Mother        mini-strokes   Heart disease Father    Emphysema Father    Cancer Brother        metastatic, unsure primary   Colon cancer Neg Hx    Prostate cancer Neg Hx    Social History Social History   Tobacco Use   Smoking status: Never Smoker   Smokeless tobacco: Never Used  Substance Use Topics   Alcohol use: No   Drug use: No   Allergies   Bee venom, Hydromorphone hcl, and Demerol  Review of Systems Review of Systems  All systems reviewed and negative, other than as noted in HPI.  Physical Exam Updated Vital Signs BP (!) 125/95    Pulse  70    Temp 98.7 F (37.1 C) (Oral)     Resp 19    Ht 5\' 2"  (1.575 m)    Wt 87.5 kg    SpO2 97%    BMI 35.30 kg/m   Physical Exam Vitals signs and nursing note reviewed.  Constitutional:      General: She is not in acute distress.    Appearance: She is well-developed.  HENT:     Head: Normocephalic and atraumatic.  Eyes:     General:        Right eye: No discharge.        Left eye: No discharge.     Conjunctiva/sclera: Conjunctivae normal.  Neck:     Musculoskeletal: Neck supple.  Cardiovascular:     Rate and Rhythm: Normal rate and regular rhythm.     Heart sounds: Normal heart sounds. No murmur. No friction rub. No gallop.   Pulmonary:     Effort: Pulmonary effort is normal. No respiratory distress.     Breath sounds: Normal breath sounds.  Abdominal:     General: There is no distension.     Palpations: Abdomen is soft.     Tenderness: There is no abdominal tenderness.  Musculoskeletal:        General: No tenderness.  Skin:    General: Skin is warm and dry.  Neurological:     Mental Status: She is alert.     Comments: Occasional myoclonic jerking of bilateral lower extremities.  Muscle tone seems normal.  Normal patellar reflexes.  Strength is 5 out of 5 bilateral upper and lower extremities.  Psychiatric:        Behavior: Behavior normal.        Thought Content: Thought content normal.     ED Treatments / Results  Labs (all labs ordered are listed, but only abnormal results are displayed) Labs Reviewed  URINALYSIS, ROUTINE W REFLEX MICROSCOPIC - Abnormal; Notable for the following components:      Result Value   Glucose, UA >=500 (*)    Leukocytes,Ua SMALL (*)    All other components within normal limits  CBC WITH DIFFERENTIAL/PLATELET - Abnormal; Notable for the following components:   WBC 2.2 (*)    RBC 3.08 (*)    HCT 35.4 (*)    MCV 114.9 (*)    MCH 39.0 (*)    Platelets 76 (*)    Neutro Abs 1.0 (*)    All other components within normal limits  BASIC METABOLIC PANEL - Abnormal; Notable for  the following components:   Chloride 97 (*)    Glucose, Bld 234 (*)    BUN 30 (*)    Creatinine, Ser 2.33 (*)    Calcium 8.6 (*)    GFR calc non Af Amer 19 (*)    GFR calc Af Amer 22 (*)    All other components within normal limits  SARS CORONAVIRUS 2 (TAT 6-24 HRS)  MAGNESIUM   EKG None  Radiology No results found.   Ct Abdomen Pelvis Wo Contrast  Result Date: 10/26/2018 CLINICAL DATA:  Fever. Right-sided flank pain. Altered mental status. History of left-sided breast cancer. EXAM: CT CHEST, ABDOMEN AND PELVIS WITHOUT CONTRAST TECHNIQUE: Multidetector CT imaging of the chest, abdomen and pelvis was performed following the standard protocol without IV contrast. COMPARISON:  CT chest dated April 11, 2018. Abdominal ultrasound dated June 09, 2014. CT abdomen pelvis dated April 10, 2011. FINDINGS: CT CHEST FINDINGS Cardiovascular: Normal heart size. No pericardial  effusion. No thoracic aortic aneurysm. Coronary, aortic arch, and branch vessel atherosclerotic vascular disease. Mediastinum/Nodes: No enlarged mediastinal, hilar, or axillary lymph nodes. Thyroid gland, trachea, and esophagus demonstrate no significant findings. Lungs/Pleura: There are few scattered small faint ground-glass densities in the right upper lobe (series 3, image 39) and left upper lobe (series 3, image 59). Scattered subsegmental atelectasis and scarring. No focal consolidation, pleural effusion, or pneumothorax. Musculoskeletal: Unchanged left breast skin thickening and retroareolar soft tissue fullness. No acute or significant osseous findings. Chronic T12 compression deformity. CT ABDOMEN PELVIS FINDINGS Hepatobiliary: Unchanged cirrhosis. No focal liver abnormality. Prior cholecystectomy. No biliary dilatation. Pancreas: Unremarkable. No pancreatic ductal dilatation or surrounding inflammatory changes. Spleen: Unchanged splenomegaly and 1.7 cm low-density lesion in the anterior spleen. Adrenals/Urinary Tract: The  adrenal glands are unremarkable. Unchanged left renal atrophy. No renal calculi or hydronephrosis. The bladder is unremarkable. Stomach/Bowel: Stomach is within normal limits. Appendix appears normal. No evidence of bowel wall thickening, distention, or inflammatory changes. Moderate left-sided colonic diverticulosis. Vascular/Lymphatic: Minimal aortic atherosclerosis. No enlarged abdominal or pelvic lymph nodes. Reproductive: Status post hysterectomy. No adnexal masses. Other: No abdominal wall hernia or abnormality. Trace perihepatic ascites. No pneumoperitoneum. Musculoskeletal: No acute or significant osseous findings. Prior lumbar decompression and fusion. Chronic L1 and L4 compression deformities. Progressive severe spondylosis at T12-L1. IMPRESSION: Chest: 1. A few scattered small faint ground-glass densities in both upper lobes may be infectious or inflammatory. No consolidative pneumonia. 2. Unchanged left breast skin thickening and retro soft tissue fullness, nonspecific in the setting of prior radiation. 3.  Aortic atherosclerosis (ICD10-I70.0). Abdomen and pelvis: 1.  No acute intra-abdominal process. 2. Unchanged cirrhosis and splenomegaly. Electronically Signed   By: Titus Dubin M.D.   On: 10/26/2018 10:34   Ct Head Wo Contrast  Result Date: 11/19/2018 CLINICAL DATA:  Altered level of consciousness. Patient woke up with generalized weakness and shaking. EXAM: CT HEAD WITHOUT CONTRAST TECHNIQUE: Contiguous axial images were obtained from the base of the skull through the vertex without intravenous contrast. COMPARISON:  None. FINDINGS: Brain: Ventricles and cisterns are within normal. Mild prominence of the CSF spaces compatible with mild age related atrophic change. Mild chronic ischemic microvascular disease is present. There is no mass, mass effect, shift of midline structures or acute hemorrhage. No evidence of acute infarction. Vascular: No hyperdense vessel or unexpected calcification.  Skull: Normal. Negative for fracture or focal lesion. Sinuses/Orbits: No acute finding. Other: None. IMPRESSION: No acute findings. Mild chronic ischemic microvascular disease and age related atrophic change. Electronically Signed   By: Marin Olp M.D.   On: 11/19/2018 13:34   Ct Chest Wo Contrast  Result Date: 10/26/2018 CLINICAL DATA:  Fever. Right-sided flank pain. Altered mental status. History of left-sided breast cancer. EXAM: CT CHEST, ABDOMEN AND PELVIS WITHOUT CONTRAST TECHNIQUE: Multidetector CT imaging of the chest, abdomen and pelvis was performed following the standard protocol without IV contrast. COMPARISON:  CT chest dated April 11, 2018. Abdominal ultrasound dated June 09, 2014. CT abdomen pelvis dated April 10, 2011. FINDINGS: CT CHEST FINDINGS Cardiovascular: Normal heart size. No pericardial effusion. No thoracic aortic aneurysm. Coronary, aortic arch, and branch vessel atherosclerotic vascular disease. Mediastinum/Nodes: No enlarged mediastinal, hilar, or axillary lymph nodes. Thyroid gland, trachea, and esophagus demonstrate no significant findings. Lungs/Pleura: There are few scattered small faint ground-glass densities in the right upper lobe (series 3, image 39) and left upper lobe (series 3, image 59). Scattered subsegmental atelectasis and scarring. No focal consolidation, pleural effusion, or pneumothorax. Musculoskeletal:  Unchanged left breast skin thickening and retroareolar soft tissue fullness. No acute or significant osseous findings. Chronic T12 compression deformity. CT ABDOMEN PELVIS FINDINGS Hepatobiliary: Unchanged cirrhosis. No focal liver abnormality. Prior cholecystectomy. No biliary dilatation. Pancreas: Unremarkable. No pancreatic ductal dilatation or surrounding inflammatory changes. Spleen: Unchanged splenomegaly and 1.7 cm low-density lesion in the anterior spleen. Adrenals/Urinary Tract: The adrenal glands are unremarkable. Unchanged left renal atrophy. No  renal calculi or hydronephrosis. The bladder is unremarkable. Stomach/Bowel: Stomach is within normal limits. Appendix appears normal. No evidence of bowel wall thickening, distention, or inflammatory changes. Moderate left-sided colonic diverticulosis. Vascular/Lymphatic: Minimal aortic atherosclerosis. No enlarged abdominal or pelvic lymph nodes. Reproductive: Status post hysterectomy. No adnexal masses. Other: No abdominal wall hernia or abnormality. Trace perihepatic ascites. No pneumoperitoneum. Musculoskeletal: No acute or significant osseous findings. Prior lumbar decompression and fusion. Chronic L1 and L4 compression deformities. Progressive severe spondylosis at T12-L1. IMPRESSION: Chest: 1. A few scattered small faint ground-glass densities in both upper lobes may be infectious or inflammatory. No consolidative pneumonia. 2. Unchanged left breast skin thickening and retro soft tissue fullness, nonspecific in the setting of prior radiation. 3.  Aortic atherosclerosis (ICD10-I70.0). Abdomen and pelvis: 1.  No acute intra-abdominal process. 2. Unchanged cirrhosis and splenomegaly. Electronically Signed   By: Titus Dubin M.D.   On: 10/26/2018 10:34   Dg Chest Port 1 View  Result Date: 10/26/2018 CLINICAL DATA:  Weakness and confusion. EXAM: PORTABLE CHEST 1 VIEW COMPARISON:  04/15/2018 FINDINGS: Chronic cardiomegaly with left ventricular prominence. Chronic aortic atherosclerosis. Chronic abnormal lung markings. Slight increase in patchy density at the lung bases could be atelectasis or mild basilar pneumonia. No visible effusion. IMPRESSION: Chronic cardiomegaly and lung markings. Slightly more prominent markings at the lung bases than seen previously. This could relate to mild atelectasis or mild basilar pneumonia. Electronically Signed   By: Nelson Chimes M.D.   On: 10/26/2018 07:37   Dg Swallowing Func-speech Pathology  Result Date: 10/30/2018 Objective Swallowing Evaluation: Type of Study:  MBS-Modified Barium Swallow Study  Patient Details Name: DALAL LIVENGOOD MRN: [id number] Date of Birth: 1933/12/11 Today's Date: 10/30/2018 Time: SLP Start Time (ACUTE ONLY): 30 -SLP Stop Time (ACUTE ONLY): 1150 SLP Time Calculation (min) (ACUTE ONLY): 40 min Past Medical History: Past Medical History: Diagnosis Date  Abrasion of arm, left   secondary to fall   Anemia   Arthritis   Breast cancer, left (HCC)   er/pr +  Cataract   no surgery as yet,starting of 3 years  Chronic kidney disease   renal stones, one episode of Lithotripsy  Cirrhosis ([place])   Metavir score 4  Complication of anesthesia   Diabetes mellitus   GERD (gastroesophageal reflux disease)   Hyperlipidemia   Hypertension   followed by [place] [facility]. relative to  urology study grp.  Doesn't report ever having a stress test   Mental disorder   Multiple falls   Nocturia   PONV (postoperative nausea and vomiting)   Seasonal allergies   Skin cancer   face- melanoma, 2012- surg. excision   Past Surgical History: Past Surgical History: Procedure Laterality Date  ABDOMINAL HYSTERECTOMY    BACK SURGERY    x4 back surgery to include rods & screws  . Last in May 2014  BREAST LUMPECTOMY WITH NEEDLE LOCALIZATION AND AXILLARY SENTINEL LYMPH NODE BX  03/12/2012  Procedure: BREAST LUMPECTOMY WITH NEEDLE LOCALIZATION AND AXILLARY SENTINEL LYMPH NODE BX;  Surgeon: Merrie Roof, MD;  Location: [place];  Service: General;  Laterality: Left;  BREAST SURGERY    lumpectomy x2/left   CHOLECYSTECTOMY    COLONOSCOPY N/A 11/20/2013  Dr.Rourk- diverticula was found in the left colon. o/w normal rectal, colonic and terminal ileal mucosa  ESOPHAGOGASTRODUODENOSCOPY N/A 11/20/2013  Dr.Rourk- abnormal mucosa was found. diffuse snake skinning and friability of the gastric mucosa. patent pylorus. abnormal-appearing first, second and third portion of the duodenum. 32mm gastric nodule at the GE junction. stomach bx= minimal chronic infammation, GE junction bx= polypoid  fragments of gastric cardia type mucosa w/mod chronic inflammation and foveolar hyperplasia  EYE SURGERY    cataract surgery bilat   FRACTURE SURGERY    1975- ORIF- L ankle   GIVENS CAPSULE STUDY N/A 02/24/2014  Procedure: GIVENS CAPSULE STUDY;  Surgeon: Daneil Dolin, MD;  [facility];  Service: Endoscopy;  Laterality: N/A;  RE-EXCISION OF BREAST LUMPECTOMY  03/22/2012  Procedure: RE-EXCISION OF BREAST LUMPECTOMY;  Surgeon: Merrie Roof, MD;  Location: [place];  Service: General;  Laterality: Left;  re-excision left breast lumpectomy cavity  ER+, PR+  ROTATOR CUFF REPAIR Bilateral   SHOULDER SURGERY    SKIN BIOPSY    TOTAL KNEE ARTHROPLASTY Right 12/18/2014  Procedure: RIGHT TOTAL KNEE ARTHROPLASTY;  Surgeon: Sydnee Cabal, MD;  [facility];  Service: Orthopedics;  Laterality: Right;  TUBAL LIGATION   HPI: [name]  is a 85 y.o. female with recurrent left breast invasive ductal carcinoma,  status post left lumpectomy, h/o DM2, HLD, HTN who presented to the ED with concerns about fevers up to 101, confusion and disorientation. She was admitted with acute hypoxic respiratory failure. She has community-acquired pneumonia. BSE requested  Subjective: "I occasionally get strangled when I am drinking water." Assessment / Plan / Recommendation CHL IP CLINICAL IMPRESSIONS 10/30/2018 Clinical Impression Pt presents with normal oropharyngeal swallow for age. Swallow trigger at the level of the valleculae with thins and occasionally to the pyriforms. Pt with flash penetration of thins before the swallow with a single teaspoon presentation and when taking barium tablet with thin, which was immediately removed and no residuals remained. Pt noted to have a prominent cricopharyngeus and tiny outpouching near UES which could be a tiny Zenker's diverticulum, however no radiologist present to confirm. Recommend regular textures and thin liquids and Pt to eat/drink slowly. No further  SLP services indicated at this time. SLP will sign off. Pt in agreement and appreciative of information.  SLP Visit Diagnosis Dysphagia, oropharyngeal phase (R13.12) Attention and concentration deficit following -- Frontal lobe and executive function deficit following -- Impact on safety and function No limitations   CHL IP TREATMENT RECOMMENDATION 10/30/2018 Treatment Recommendations No treatment recommended at this time   Prognosis 10/30/2018 Prognosis for Safe Diet Advancement Good Barriers to Reach Goals -- Barriers/Prognosis Comment -- CHL IP DIET RECOMMENDATION 10/30/2018 SLP Diet Recommendations Regular solids;Thin liquid Liquid Administration via Cup;Straw Medication Administration Whole meds with liquid Compensations Slow rate Postural Changes Remain semi-upright after after feeds/meals (Comment);Seated upright at 90 degrees   CHL IP OTHER RECOMMENDATIONS 10/30/2018 Recommended Consults -- Oral Care Recommendations Oral care BID;Patient independent with oral care Other Recommendations Clarify dietary restrictions   CHL IP FOLLOW UP RECOMMENDATIONS 10/30/2018 Follow up Recommendations None   CHL IP FREQUENCY AND DURATION 10/29/2018 Speech Therapy Frequency (ACUTE ONLY) min 2x/week Treatment Duration 1 week      CHL IP ORAL PHASE 10/30/2018 Oral Phase WFL Oral - Pudding Teaspoon -- Oral - Pudding Cup -- Oral - Honey Teaspoon --  Oral - Honey Cup -- Oral - Nectar Teaspoon -- Oral - Nectar Cup -- Oral - Nectar Straw -- Oral - Thin Teaspoon -- Oral - Thin Cup -- Oral - Thin Straw -- Oral - Puree -- Oral - Mech Soft -- Oral - Regular -- Oral - Multi-Consistency -- Oral - Pill -- Oral Phase - Comment --  CHL IP PHARYNGEAL PHASE 10/30/2018 Pharyngeal Phase Impaired Pharyngeal- Pudding Teaspoon -- Pharyngeal -- Pharyngeal- Pudding Cup -- Pharyngeal -- Pharyngeal- Honey Teaspoon -- Pharyngeal -- Pharyngeal- Honey Cup -- Pharyngeal -- Pharyngeal- Nectar Teaspoon -- Pharyngeal -- Pharyngeal- Nectar Cup -- Pharyngeal -- Pharyngeal-  Nectar Straw -- Pharyngeal -- Pharyngeal- Thin Teaspoon Delayed swallow initiation-pyriform sinuses;Penetration/Aspiration before swallow Pharyngeal Material enters airway, remains ABOVE vocal cords then ejected out;Material does not enter airway Pharyngeal- Thin Cup Delayed swallow initiation-vallecula;Delayed swallow initiation-pyriform sinuses Pharyngeal -- Pharyngeal- Thin Straw Delayed swallow initiation-pyriform sinuses Pharyngeal -- Pharyngeal- Puree WFL Pharyngeal -- Pharyngeal- Mechanical Soft -- Pharyngeal -- Pharyngeal- Regular WFL Pharyngeal -- Pharyngeal- Multi-consistency -- Pharyngeal -- Pharyngeal- Pill Penetration/Aspiration during swallow Pharyngeal Material enters airway, remains ABOVE vocal cords then ejected out Pharyngeal Comment --  CHL IP CERVICAL ESOPHAGEAL PHASE 10/30/2018 Cervical Esophageal Phase Impaired Pudding Teaspoon -- Pudding Cup -- Honey Teaspoon -- Honey Cup -- Nectar Teaspoon -- Nectar Cup -- Nectar Straw -- Thin Teaspoon Prominent cricopharyngeal segment;Other (Comment) Thin Cup -- Thin Straw -- Puree -- Mechanical Soft -- Regular -- Multi-consistency -- Pill -- Cervical Esophageal Comment -- Thank you, Genene Churn, [place] PORTER,DABNEY 10/30/2018, 12:45 PM               Procedures Procedures (including critical care time)  Medications Ordered in ED Medications  LORazepam (ATIVAN) injection 1 mg (has no administration in time range)     Initial Impression / Assessment and Plan / ED Course  I have reviewed the triage vital signs and the nursing notes.  Pertinent labs & imaging results that were available during my care of the patient were reviewed by me and considered in my medical decision making (see chart for details).     84yF with what appears to be myoclinc jerking of LE. No more since a dose of ativan. CT head /wo acute finding. Basic labs unremarkable. No new meds. Neuro exam otherwise at baseline. Ambulated in ED to bathroom with walker  which is her baseline. Will DC with PRN low dose clonazepam is needed. PCP FU otherwise.   Final Clinical Impressions(s) / ED Diagnoses   Final diagnoses:  Myoclonic jerking    ED Discharge Orders    None       Virgel Manifold, MD 11/21/18 1422

## 2018-11-19 NOTE — ED Triage Notes (Signed)
EMS reports pt woke up with generalized weakness and shaking.   Denies fever.  Reports felt "fine" yesterday.  CBG 318.  Has not taken any meds this week.  Pt also being treated for stage III breast cancer.

## 2018-11-19 NOTE — ED Notes (Signed)
Patient transported to CT 

## 2018-11-19 NOTE — ED Notes (Signed)
Pt ambulated with walker.

## 2018-11-21 ENCOUNTER — Telehealth: Payer: Self-pay | Admitting: *Deleted

## 2018-11-21 DIAGNOSIS — D631 Anemia in chronic kidney disease: Secondary | ICD-10-CM | POA: Diagnosis not present

## 2018-11-21 DIAGNOSIS — J189 Pneumonia, unspecified organism: Secondary | ICD-10-CM | POA: Diagnosis not present

## 2018-11-21 DIAGNOSIS — J9601 Acute respiratory failure with hypoxia: Secondary | ICD-10-CM | POA: Diagnosis not present

## 2018-11-21 DIAGNOSIS — E1165 Type 2 diabetes mellitus with hyperglycemia: Secondary | ICD-10-CM | POA: Diagnosis not present

## 2018-11-21 DIAGNOSIS — G9341 Metabolic encephalopathy: Secondary | ICD-10-CM | POA: Diagnosis not present

## 2018-11-21 DIAGNOSIS — I129 Hypertensive chronic kidney disease with stage 1 through stage 4 chronic kidney disease, or unspecified chronic kidney disease: Secondary | ICD-10-CM | POA: Diagnosis not present

## 2018-11-21 DIAGNOSIS — E1122 Type 2 diabetes mellitus with diabetic chronic kidney disease: Secondary | ICD-10-CM | POA: Diagnosis not present

## 2018-11-21 DIAGNOSIS — R531 Weakness: Secondary | ICD-10-CM | POA: Diagnosis not present

## 2018-11-21 DIAGNOSIS — C50012 Malignant neoplasm of nipple and areola, left female breast: Secondary | ICD-10-CM | POA: Diagnosis not present

## 2018-11-21 DIAGNOSIS — N184 Chronic kidney disease, stage 4 (severe): Secondary | ICD-10-CM | POA: Diagnosis not present

## 2018-11-21 DIAGNOSIS — C50919 Malignant neoplasm of unspecified site of unspecified female breast: Secondary | ICD-10-CM | POA: Diagnosis not present

## 2018-11-21 DIAGNOSIS — G253 Myoclonus: Secondary | ICD-10-CM | POA: Diagnosis not present

## 2018-11-21 DIAGNOSIS — E1121 Type 2 diabetes mellitus with diabetic nephropathy: Secondary | ICD-10-CM | POA: Diagnosis not present

## 2018-11-21 NOTE — Telephone Encounter (Signed)
This RN spoke with pt's dtr per her call regarding changes in pt's condition- including " myoclonic jerking " and increase in skin metastasis on chest.  Per phone discussion [name] stated Eleena had the " jerking " when she was admitted for pneumonia - which the doctors felt was secondary to the pneumonia. Daishia had 2nd episode this past Tuesday of myoclonic jerking - where they proceeded to the ER.  " so now they think the pneumonia and the jerking are 2 separate issues "  Pt was given anti seizure medication per ER discharge and has not had another episode presently.  This RN then inquired of increase in chest lesions ( [name] calls it a rash ) with pt informing her of increase in lesions on Friday 9/18- lesions are not open nor oozing - " just a whole lot of them and very red ".  Of note they had a death in the family on 2022/12/07 " which is why I didn't call Monday and then mom had the seizure episode on Tuesday "  Per above discussion- plan is for pt to come in prior to current appointment on 12/13/2018- appointment made by this RN for opening on 9/28 with MD, including lab and injection ( faslodex- if needed ).  [name] verbalized understanding of plan- no further needs at this time.

## 2018-11-22 DIAGNOSIS — G9341 Metabolic encephalopathy: Secondary | ICD-10-CM | POA: Diagnosis not present

## 2018-11-22 DIAGNOSIS — N184 Chronic kidney disease, stage 4 (severe): Secondary | ICD-10-CM | POA: Diagnosis not present

## 2018-11-22 DIAGNOSIS — R531 Weakness: Secondary | ICD-10-CM | POA: Diagnosis not present

## 2018-11-22 DIAGNOSIS — I129 Hypertensive chronic kidney disease with stage 1 through stage 4 chronic kidney disease, or unspecified chronic kidney disease: Secondary | ICD-10-CM | POA: Diagnosis not present

## 2018-11-22 DIAGNOSIS — J189 Pneumonia, unspecified organism: Secondary | ICD-10-CM | POA: Diagnosis not present

## 2018-11-22 DIAGNOSIS — C50012 Malignant neoplasm of nipple and areola, left female breast: Secondary | ICD-10-CM | POA: Diagnosis not present

## 2018-11-22 DIAGNOSIS — E1165 Type 2 diabetes mellitus with hyperglycemia: Secondary | ICD-10-CM | POA: Diagnosis not present

## 2018-11-22 DIAGNOSIS — J9601 Acute respiratory failure with hypoxia: Secondary | ICD-10-CM | POA: Diagnosis not present

## 2018-11-22 DIAGNOSIS — D631 Anemia in chronic kidney disease: Secondary | ICD-10-CM | POA: Diagnosis not present

## 2018-11-22 DIAGNOSIS — E1122 Type 2 diabetes mellitus with diabetic chronic kidney disease: Secondary | ICD-10-CM | POA: Diagnosis not present

## 2018-11-24 NOTE — Progress Notes (Signed)
[place]  [phone] [phone]    ID: [name] DOB: 1933-10-06  MR#: [id number]  SLH#:[id number]  Patient Care Team: Sinda Du, MD as PCP - General (Internal Medicine) Gala Romney, Cristopher Estimable, MD as Consulting Physician (Gastroenterology) Cornellius Kropp, Virgie Dad, MD as Consulting Physician (Oncology) Jovita Kussmaul, MD as Consulting Physician (General Surgery) Gery Pray, MD as Consulting Physician (Radiation Oncology) Erline Levine, MD as Consulting Physician (Neurosurgery) Chauncey Cruel, MD OTHER MD:   CHIEF COMPLAINT: Recurrent estrogen receptor positive breast cancer  CURRENT TREATMENT: FULVESTRANT; PALBOCICLIB; TAMOXIFEN   INTERVAL HISTORY: [name] returns today for follow-up and treatment of her recurrent estrogen receptor positive breast cancer. She was last seen here on 10/08/2018.   She continues on palbociclib.  Her dose was reduced to 75 mg because we did not want to do intensive monitoring to save her unnecessary visits here.  Today she is in the third week.  She does not have any side effects of from this that she is aware of.  She also continues on fulvestrant.  We also dropped the fulvestrant to every 8 weeks again to minimize visits.  She will receive a dose today  She was restarted on tamoxifen while we are doing the fulvestrant every 8 weeks.  She does not have problems with hot flashes or vaginal wetness from this.  Since her last visit here, she underwent a chest xray on 10/26/2018 for weakness and confusion showing: Chronic cardiomegaly and lung markings. Slightly more prominent markings at the lung bases than seen previously. This could relate to mild atelectasis or mild basilar pneumonia.  She then underwent a chest abdomen and pelvis CT on 10/26/2018 showing: A few scattered small faint ground-glass densities in both upper lobes may be infectious or inflammatory. No consolidative pneumonia. Unchanged left breast  skin thickening and retro soft tissue fullness, nonspecific in the setting of prior radiation. Aortic atherosclerosis (ICD10-I70.0).  There is an incidental finding of cirrhosis.  She also underwent a head CT on 11/19/2018 for an altered level of consciousness showing: No acute findings. Mild chronic ischemic microvascular disease and age related atrophic change.  She is up to date on mammography, which was last completed on 02/26/2018 at [place] [facility].    REVIEW OF SYSTEMS: [name] is feeling a little bit more tired, occasionally feels dizzy, and has been having this jerking issue.  She has not had fevers, rash, or bleeding.  She keeps a good appetite although she usually does not eat much at lunch according to the daughter.  The family is very concerned about her very variable mental status and they tell me she has been referred to a neurologist but they do not have a date for a visit yet.  There has been one fall in the last several weeks, which was not traumatic.  She currently denies unusual headaches, nausea, vomiting, cough, phlegm production, pleurisy, or change in bowel or bladder habits.  Detailed review of systems was otherwise stable  HISTORY OF CURRENT ILLNESS: From the original intake note  [name] has a history of left breast invasive ductal carcinoma,  status post left lumpectomy and sentinel lymph node sampling 03/12/2012 for a pT1b pN0, stage IA invasive ductal carcinoma, grade 3, strongly estrogen and progesterone receptor positive, HER-2 nonamplified, with an MIB-1-1 of 15%.  The single sentinel lymph node was negative.  There was a positive margin.  She had reexcision on 03/22/2012 for what proved to be a single residual focus  of intermediate grade ductal carcinoma in situ.  This was close to the lateral inked margin which was however negative.  She saw radiation oncology (Drs. Orlene Erm and Valere Dross) who felt adjuvant endocrine therapy alone would be adequate. She  started anastrozole under Dr. Jacquiline Doe at [facility], on which she continues.  More recently she underwent routine bilateral diagnostic mammography with tomography and left breast ultrasonography at [facility] on 02/26/2018 showing: highly suspicious 1.6 cm retroareolar left breast mass, and single abnormal left axillary lymph node.  Accordingly on 03/05/2018 she proceeded to biopsy of the left breast area in question. The pathology ([id number]) from this procedure showed: invasive ductal carcinoma, grade 3; ductal carcinoma in the lymph node tissue. Prognostic indicators significant for: estrogen receptor, 95% positive and progesterone receptor, 5% positive, both with strong staining intensity. Proliferation marker Ki67 at 20%. HER2 negative by immunohistochemistry, 1+.  The patient's subsequent history is as detailed below.   PAST MEDICAL HISTORY: Past Medical History:  Diagnosis Date   Abrasion of arm, left    secondary to fall    Anemia    Arthritis    Breast cancer, left (HCC)    er/pr +   Cataract    no surgery as yet,starting of 3 years   Chronic kidney disease    renal stones, one episode of Lithotripsy   Cirrhosis ([place])    Metavir score 4   Complication of anesthesia    Diabetes mellitus    GERD (gastroesophageal reflux disease)    Hyperlipidemia    Hypertension    followed by [facility]. relative to  urology study grp.  Doesn't report ever having a stress test    Mental disorder    Multiple falls    Nocturia    PONV (postoperative nausea and vomiting)    Seasonal allergies    Skin cancer    face- melanoma, 2012- surg. excision      PAST SURGICAL HISTORY: Past Surgical History:  Procedure Laterality Date   ABDOMINAL HYSTERECTOMY     BACK SURGERY     x4 back surgery to include rods & screws  . Last in May 2014   BREAST LUMPECTOMY WITH NEEDLE LOCALIZATION AND AXILLARY SENTINEL LYMPH NODE BX  03/12/2012   Procedure: BREAST LUMPECTOMY  WITH NEEDLE LOCALIZATION AND AXILLARY SENTINEL LYMPH NODE BX;  Surgeon: Merrie Roof, MD;  Location: [place];  Service: General;  Laterality: Left;   BREAST SURGERY     lumpectomy x2/left    CHOLECYSTECTOMY     COLONOSCOPY N/A 11/20/2013   Dr.Rourk- diverticula was found in the left colon. o/w normal rectal, colonic and terminal ileal mucosa   ESOPHAGOGASTRODUODENOSCOPY N/A 11/20/2013   Dr.Rourk- abnormal mucosa was found. diffuse snake skinning and friability of the gastric mucosa. patent pylorus. abnormal-appearing first, second and third portion of the duodenum. 83m gastric nodule at the GE junction. stomach bx= minimal chronic infammation, GE junction bx= polypoid fragments of gastric cardia type mucosa w/mod chronic inflammation and foveolar hyperplasia   EYE SURGERY     cataract surgery bilat    FRACTURE SURGERY     1975- ORIF- L ankle    GIVENS CAPSULE STUDY N/A 02/24/2014   Procedure: GIVENS CAPSULE STUDY;  Surgeon: RDaneil Dolin MD;  [facility];  Service: Endoscopy;  Laterality: N/A;   RE-EXCISION OF BREAST LUMPECTOMY  03/22/2012   Procedure: RE-EXCISION OF BREAST LUMPECTOMY;  Surgeon: PMerrie Roof MD;  Location: [facility]  Service:  General;  Laterality: Left;  re-excision left breast lumpectomy cavity  ER+, PR+   ROTATOR CUFF REPAIR Bilateral    SHOULDER SURGERY     SKIN BIOPSY     TOTAL KNEE ARTHROPLASTY Right 12/18/2014   Procedure: RIGHT TOTAL KNEE ARTHROPLASTY;  Surgeon: Sydnee Cabal, MD;  [facility];  Service: Orthopedics;  Laterality: Right;   TUBAL LIGATION      FAMILY HISTORY: Family History  Problem Relation Age of Onset   Stroke Mother        mini-strokes   Heart disease Father    Emphysema Father    Cancer Brother        metastatic, unsure primary   Colon cancer Neg Hx    Prostate cancer Neg Hx    Patient father was 62 years old when he died from "heart trouble" and emphysema. Patient mother died  from mini-stroke complications at age 31. She denies a family hx of breast or ovarian cancer, prostate or pancreatic cancers. She has 3 siblings. She had a twin brother who died at age 22 from metastatic cancer. She had 2 sisters. Her only living sibling is a sister.   GYNECOLOGIC HISTORY:  No LMP recorded. Patient has had a hysterectomy. Menarche: 85 years old Age at first live birth: 85 years old [place] P 2 LMP s/p hysterectomy Contraceptive no HRT yes, for more than 5 years  Hysterectomy? Yes, at age 89 BSO? 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 Her husband retired on disability, but he passed away in 05-12-15 from mini-strokes. She lives at home with 2 cats. Daughter [name], who is in her 16s, is married to Micco (who works for the [place]) and lives very close to the patient.  [name] works for the [employer] division of social services from home.  The patient's son died at age 60 from accidental overdose.  The patient attends [employer] in [place].   ADVANCED DIRECTIVES: Daughter [name] is her HCPOA.   HEALTH MAINTENANCE: Social History   Tobacco Use   Smoking status: Never Smoker   Smokeless tobacco: Never Used  Substance Use Topics   Alcohol use: No   Drug use: No     Colonoscopy: 11/20/2013, Dr. Gala Romney, chronic inflammation  PAP: s/p hysterectomy   Bone density: "had one probably 10 years ago"   Allergies  Allergen Reactions   Bee Venom Anaphylaxis and Swelling   Hydromorphone Hcl Other (See Comments)    Agitation, Confusion   Demerol Nausea Only    Current Outpatient Medications  Medication Sig Dispense Refill   acyclovir (ZOVIRAX) 400 MG tablet Take 1 tablet (400 mg total) by mouth 2 (two) times daily. 120 tablet 3   ALPHAGAN P 0.1 % SOLN Place 1 drop into both eyes 2 (two) times daily.      amLODipine (NORVASC) 5 MG tablet Take 5 mg by mouth every  morning.      aspirin EC 81 MG tablet Take 81 mg by mouth daily.     atorvastatin (LIPITOR) 10 MG tablet Take 10 mg by mouth every evening.      Bacillus Coagulans-Inulin (PROBIOTIC FORMULA PO) Take 1 capsule by mouth daily.     BD PEN NEEDLE NANO U/F 32G X 4 MM MISC      clonazePAM (KLONOPIN) 0.5 MG tablet Take 1 tablet (0.5 mg total) by mouth 2 (two) times daily as needed (spasms). 15  tablet 0   cyanocobalamin (,VITAMIN B-12,) 1000 MCG/ML injection Inject 1,000 mcg into the muscle every 30 (thirty) days.      dorzolamide (TRUSOPT) 2 % ophthalmic solution Place 1 drop into both eyes 2 (two) times daily.   4   EPIPEN 2-PAK 0.3 MG/0.3ML SOAJ injection Inject 0.3 mg into the muscle as needed for anaphylaxis.      fulvestrant (FASLODEX) 250 MG/5ML injection Inject 500 mg into the muscle every 14 (fourteen) days. One injection each buttock over 1-2 minutes. Warm prior to use.     gabapentin (NEURONTIN) 600 MG tablet Take 600 mg by mouth daily.      glipiZIDE (GLUCOTROL) 5 MG tablet Take 5 mg by mouth daily before breakfast.      guaiFENesin-dextromethorphan (ROBITUSSIN DM) 100-10 MG/5ML syrup Take 5 mLs by mouth every 4 (four) hours as needed for cough. 118 mL 0   ipratropium-albuterol (DUONEB) 0.5-2.5 (3) MG/3ML SOLN Take 3 mLs by nebulization 3 (three) times daily. 360 mL 3   Lancets (ONETOUCH DELICA PLUS [id number]) MISC      latanoprost (XALATAN) 0.005 % ophthalmic solution Place 1 drop into both eyes at bedtime.      levofloxacin (LEVAQUIN) 500 MG tablet Take 1 tablet (500 mg total) by mouth daily. (Patient not taking: Reported on 11/19/2018) 5 tablet 0   methocarbamol (ROBAXIN) 500 MG tablet Take 500 mg by mouth 2 (two) times daily as needed for muscle spasms.     metoprolol succinate (TOPROL-XL) 100 MG 24 hr tablet Take 100 mg by mouth every morning. Take with or immediately following a meal.     mirtazapine (REMERON) 15 MG tablet Take 15 mg by mouth at bedtime.       MYRBETRIQ 50 MG TB24 tablet Take 50 mg by mouth daily.      omeprazole (PRILOSEC) 20 MG capsule Take 20 mg by mouth daily.     ONETOUCH ULTRA test strip      palbociclib (IBRANCE) 75 MG tablet Take 1 tablet (75 mg total) by mouth daily. Take for 21 days on, 7 days off, repeat every 28 days. 21 tablet 6   Polyethyl Glycol-Propyl Glycol (SYSTANE ULTRA) 0.4-0.3 % SOLN Place 1-2 drops into both eyes daily as needed (for itching eyes).     predniSONE (STERAPRED UNI-PAK 21 TAB) 10 MG (21) TBPK tablet Take by package instructions (Patient not taking: Reported on 11/19/2018) 21 tablet o   torsemide (DEMADEX) 20 MG tablet Take 1 tablet (20 mg total) by mouth daily. 30 tablet 5   TRESIBA FLEXTOUCH 100 UNIT/ML SOPN FlexTouch Pen Inject 0.5 mLs (50 Units total) into the skin daily. 5 pen 5   No current facility-administered medications for this visit.     OBJECTIVE: Elderly white woman using a chair walker.  Vitals:   11/25/18 1418  BP: 110/60  Pulse: 92  Resp: 18  Temp: 98.9 F (37.2 C)  SpO2: 93%   Wt Readings from Last 3 Encounters:  11/25/18 202 lb 11.2 oz (91.9 kg)  11/19/18 193 lb (87.5 kg)  10/26/18 202 lb 8 oz (91.9 kg)   Body mass index is 37.07 kg/m.    ECOG FS:2 - Symptomatic, <50% confined to bed  Ocular: Sclerae unicteric, pupils round and equal Ear-nose-throat: Wearing a mask Lymphatic: No cervical or supraclavicular adenopathy Lungs no rales or rhonchi Heart regular rate and rhythm Abd soft, nontender, positive bowel sounds MSK no focal spinal tenderness, no joint edema Neuro: non-focal, well-oriented, appropriate affect Breasts: The right  breast is unremarkable.  The left breast is imaged below.  While the overall picture is pretty much the same, the more medial slightly more erythematous lesion is also more palpable and I think does indicate mild progression  Left breast 11/25/2018    Left Breast 10/08/2018     Left breast 08/13/2018     Left breast  04/03/2018     LAB RESULTS:  CMP     Component Value Date/Time   NA 134 (L) 11/25/2018 1350   K 4.1 11/25/2018 1350   CL 95 (L) 11/25/2018 1350   CO2 29 11/25/2018 1350   GLUCOSE 490 (H) 11/25/2018 1350   BUN 22 11/25/2018 1350   CREATININE 2.49 (H) 11/25/2018 1350   CREATININE 2.18 (H) 10/08/2018 1146   CALCIUM 8.8 (L) 11/25/2018 1350   PROT 6.5 11/25/2018 1350   ALBUMIN 3.4 (L) 11/25/2018 1350   AST 24 11/25/2018 1350   AST 27 10/08/2018 1146   ALT 16 11/25/2018 1350   ALT 15 10/08/2018 1146   ALKPHOS 109 11/25/2018 1350   BILITOT 0.7 11/25/2018 1350   BILITOT 1.0 10/08/2018 1146   GFRNONAA 17 (L) 11/25/2018 1350   GFRNONAA 20 (L) 10/08/2018 1146   GFRAA 20 (L) 11/25/2018 1350   GFRAA 24 (L) 10/08/2018 1146    No results found for: TOTALPROTELP, ALBUMINELP, A1GS, A2GS, BETS, BETA2SER, GAMS, MSPIKE, SPEI  No results found for: KPAFRELGTCHN, LAMBDASER, KAPLAMBRATIO  Lab Results  Component Value Date   WBC 1.9 (L) 11/25/2018   NEUTROABS 0.9 (L) 11/25/2018   HGB 11.4 (L) 11/25/2018   HCT 33.1 (L) 11/25/2018   MCV 114.5 (H) 11/25/2018   PLT 93 (L) 11/25/2018    '@LASTCHEMISTRY'$ @  No results found for: LABCA2  No components found for: [id number]  No results for input(s): INR in the last 168 hours.  No results found for: LABCA2  No results found for: WJX914  No results found for: NWG956  No results found for: OZH086  Lab Results  Component Value Date   CA2729 25.6 04/26/2018    No components found for: HGQUANT  No results found for: CEA1 / No results found for: CEA1   No results found for: AFPTUMOR  No results found for: [place]  No results found for: PSA1  Appointment on 11/25/2018  Component Date Value Ref Range Status   Sodium 11/25/2018 134* 135 - 145 mmol/L Final   Potassium 11/25/2018 4.1  3.5 - 5.1 mmol/L Final   Chloride 11/25/2018 95* 98 - 111 mmol/L Final   CO2 11/25/2018 29  22 - 32 mmol/L Final   Glucose, Bld 11/25/2018  490* 70 - 99 mg/dL Final   BUN 11/25/2018 22  8 - 23 mg/dL Final   Creatinine, Ser 11/25/2018 2.49* 0.44 - 1.00 mg/dL Final   Calcium 11/25/2018 8.8* 8.9 - 10.3 mg/dL Final   Total Protein 11/25/2018 6.5  6.5 - 8.1 g/dL Final   Albumin 11/25/2018 3.4* 3.5 - 5.0 g/dL Final   AST 11/25/2018 24  15 - 41 U/L Final   ALT 11/25/2018 16  0 - 44 U/L Final   Alkaline Phosphatase 11/25/2018 109  38 - 126 U/L Final   Total Bilirubin 11/25/2018 0.7  0.3 - 1.2 mg/dL Final   GFR calc non Af Amer 11/25/2018 17* >60 mL/min Final   GFR calc Af Amer 11/25/2018 20* >60 mL/min Final   Anion gap 11/25/2018 10  5 - 15 Final   Performed at [facility], [place]  [address], [place], [place] [address]   WBC 11/25/2018 1.9* 4.0 - 10.5 K/uL Final   RBC 11/25/2018 2.89* 3.87 - 5.11 MIL/uL Final   Hemoglobin 11/25/2018 11.4* 12.0 - 15.0 g/dL Final   HCT 11/25/2018 33.1* 36.0 - 46.0 % Final   MCV 11/25/2018 114.5* 80.0 - 100.0 fL Final   MCH 11/25/2018 39.4* 26.0 - 34.0 pg Final   MCHC 11/25/2018 34.4  30.0 - 36.0 g/dL Final   RDW 11/25/2018 14.6  11.5 - 15.5 % Final   Platelets 11/25/2018 93* 150 - 400 K/uL Final   nRBC 11/25/2018 0.0  0.0 - 0.2 % Final   Neutrophils Relative % 11/25/2018 44  % Final   Neutro Abs 11/25/2018 0.9* 1.7 - 7.7 K/uL Final   Lymphocytes Relative 11/25/2018 45  % Final   Lymphs Abs 11/25/2018 0.9  0.7 - 4.0 K/uL Final   Monocytes Relative 11/25/2018 5  % Final   Monocytes Absolute 11/25/2018 0.1  0.1 - 1.0 K/uL Final   Eosinophils Relative 11/25/2018 3  % Final   Eosinophils Absolute 11/25/2018 0.1  0.0 - 0.5 K/uL Final   Basophils Relative 11/25/2018 2  % Final   Basophils Absolute 11/25/2018 0.0  0.0 - 0.1 K/uL Final   Immature Granulocytes 11/25/2018 1  % Final   Abs Immature Granulocytes 11/25/2018 0.01  0.00 - 0.07 K/uL Final   Performed at [facility], [address] [place], [place] [address]      (this displays the last labs from the last 3 days)  No results found for: TOTALPROTELP, ALBUMINELP, A1GS, A2GS, BETS, BETA2SER, GAMS, MSPIKE, SPEI (this displays SPEP labs)  No results found for: KPAFRELGTCHN, LAMBDASER, KAPLAMBRATIO (kappa/lambda light chains)  No results found for: HGBA, HGBA2QUANT, HGBFQUANT, HGBSQUAN (Hemoglobinopathy evaluation)   No results found for: LDH  No results found for: IRON, TIBC, IRONPCTSAT (Iron and TIBC)  No results found for: FERRITIN  Urinalysis    Component Value Date/Time   COLORURINE YELLOW 11/19/2018 [place] 11/19/2018 1455   LABSPEC 1.009 11/19/2018 1455   PHURINE 6.0 11/19/2018 1455   GLUCOSEU >=500 (A) 11/19/2018 1455   HGBUR NEGATIVE 11/19/2018 1455   BILIRUBINUR NEGATIVE 11/19/2018 1455   KETONESUR NEGATIVE 11/19/2018 1455   PROTEINUR NEGATIVE 11/19/2018 1455   UROBILINOGEN 1.0 12/11/2014 1106   NITRITE NEGATIVE 11/19/2018 1455   LEUKOCYTESUR SMALL (A) 11/19/2018 1455     STUDIES: Ct Head Wo Contrast  Result Date: 11/19/2018 CLINICAL DATA:  Altered level of consciousness. Patient woke up with generalized weakness and shaking. EXAM: CT HEAD WITHOUT CONTRAST TECHNIQUE: Contiguous axial images were obtained from the base of the skull through the vertex without intravenous contrast. COMPARISON:  None. FINDINGS: Brain: Ventricles and cisterns are within normal. Mild prominence of the CSF spaces compatible with mild age related atrophic change. Mild chronic ischemic microvascular disease is present. There is no mass, mass effect, shift of midline structures or acute hemorrhage. No evidence of acute infarction. Vascular: No hyperdense vessel or unexpected calcification. Skull: Normal. Negative for fracture or focal lesion. Sinuses/Orbits: No acute finding. Other: None. IMPRESSION: No acute findings. Mild chronic ischemic microvascular disease and age related atrophic change. Electronically Signed   By: Marin Olp  M.D.   On: 11/19/2018 13:34   Dg Swallowing Func-speech Pathology  Result Date: 10/30/2018 Objective Swallowing Evaluation: Type of Study: MBS-Modified Barium Swallow Study  Patient Details Name: [name] MRN: [id number] Date of Birth: [birth year] Today's Date: 10/30/2018 Time: SLP  Start Time (ACUTE ONLY): 1110 -SLP Stop Time (ACUTE ONLY): 1150 SLP Time Calculation (min) (ACUTE ONLY): 40 min Past Medical History: Past Medical History: Diagnosis Date  Abrasion of arm, left   secondary to fall   Anemia   Arthritis   Breast cancer, left (HCC)   er/pr +  Cataract   no surgery as yet,starting of 3 years  Chronic kidney disease   renal stones, one episode of Lithotripsy  Cirrhosis (HCC)   Metavir score 4  Complication of anesthesia   Diabetes mellitus   GERD (gastroesophageal reflux disease)   Hyperlipidemia   Hypertension   followed by [facility]. relative to  urology study grp.  Doesn't report ever having a stress test   Mental disorder   Multiple falls   Nocturia   PONV (postoperative nausea and vomiting)   Seasonal allergies   Skin cancer   face- melanoma, 2012- surg. excision   Past Surgical History: Past Surgical History: Procedure Laterality Date  ABDOMINAL HYSTERECTOMY    BACK SURGERY    x4 back surgery to include rods & screws  . Last in May 2014  BREAST LUMPECTOMY WITH NEEDLE LOCALIZATION AND AXILLARY SENTINEL LYMPH NODE BX  03/12/2012  Procedure: BREAST LUMPECTOMY WITH NEEDLE LOCALIZATION AND AXILLARY SENTINEL LYMPH NODE BX;  Surgeon: Merrie Roof, MD;  Location: [facility];  Service: General;  Laterality: Left;  BREAST SURGERY    lumpectomy x2/left   CHOLECYSTECTOMY    COLONOSCOPY N/A 11/20/2013  Dr.Rourk- diverticula was found in the left colon. o/w normal rectal, colonic and terminal ileal mucosa  ESOPHAGOGASTRODUODENOSCOPY N/A 11/20/2013  Dr.Rourk- abnormal mucosa was found. diffuse snake skinning and friability of the gastric mucosa. patent pylorus. abnormal-appearing first,  second and third portion of the duodenum. 58m gastric nodule at the GE junction. stomach bx= minimal chronic infammation, GE junction bx= polypoid fragments of gastric cardia type mucosa w/mod chronic inflammation and foveolar hyperplasia  EYE SURGERY    cataract surgery bilat   FRACTURE SURGERY    1975- ORIF- L ankle   GIVENS CAPSULE STUDY N/A 02/24/2014  Procedure: GIVENS CAPSULE STUDY;  Surgeon: RDaneil Dolin MD;  [facility];  Service: Endoscopy;  Laterality: N/A;  RE-EXCISION OF BREAST LUMPECTOMY  03/22/2012  Procedure: RE-EXCISION OF BREAST LUMPECTOMY;  Surgeon: PMerrie Roof MD;  Location: [facility]  Service: General;  Laterality: Left;  re-excision left breast lumpectomy cavity  ER+, PR+  ROTATOR CUFF REPAIR Bilateral   SHOULDER SURGERY    SKIN BIOPSY    TOTAL KNEE ARTHROPLASTY Right 12/18/2014  Procedure: RIGHT TOTAL KNEE ARTHROPLASTY;  Surgeon: RSydnee Cabal MD;  [facility];  Service: Orthopedics;  Laterality: Right;  TUBAL LIGATION   HPI: BJovanni [name] is a 85y.o. female with recurrent left breast invasive ductal carcinoma,  status post left lumpectomy, h/o DM2, HLD, HTN who presented to the ED with concerns about fevers up to 101, confusion and disorientation. She was admitted with acute hypoxic respiratory failure. She has community-acquired pneumonia. BSE requested  Subjective: "I occasionally get strangled when I am drinking water." Assessment / Plan / Recommendation CHL IP CLINICAL IMPRESSIONS 10/30/2018 Clinical Impression Pt presents with normal oropharyngeal swallow for age. Swallow trigger at the level of the valleculae with thins and occasionally to the pyriforms. Pt with flash penetration of thins before the swallow with a single teaspoon presentation and when taking barium tablet with thin, which was immediately removed and no residuals remained. Pt noted to  have a prominent cricopharyngeus and tiny outpouching near UES which could be a tiny  Zenker's diverticulum, however no radiologist present to confirm. Recommend regular textures and thin liquids and Pt to eat/drink slowly. No further SLP services indicated at this time. SLP will sign off. Pt in agreement and appreciative of information.  SLP Visit Diagnosis Dysphagia, oropharyngeal phase (R13.12) Attention and concentration deficit following -- Frontal lobe and executive function deficit following -- Impact on safety and function No limitations   CHL IP TREATMENT RECOMMENDATION 10/30/2018 Treatment Recommendations No treatment recommended at this time   Prognosis 10/30/2018 Prognosis for Safe Diet Advancement Good Barriers to Reach Goals -- Barriers/Prognosis Comment -- CHL IP DIET RECOMMENDATION 10/30/2018 SLP Diet Recommendations Regular solids;Thin liquid Liquid Administration via Cup;Straw Medication Administration Whole meds with liquid Compensations Slow rate Postural Changes Remain semi-upright after after feeds/meals (Comment);Seated upright at 90 degrees   CHL IP OTHER RECOMMENDATIONS 10/30/2018 Recommended Consults -- Oral Care Recommendations Oral care BID;Patient independent with oral care Other Recommendations Clarify dietary restrictions   [facility] IP FOLLOW UP RECOMMENDATIONS 10/30/2018 Follow up Recommendations None   [facility] IP FREQUENCY AND DURATION 10/29/2018 Speech Therapy Frequency (ACUTE ONLY) min 2x/week Treatment Duration 1 week      [facility]L IP ORAL PHASE 10/30/2018 Oral Phase WFL Oral - Pudding Teaspoon -- Oral - Pudding Cup -- Oral - Honey Teaspoon -- Oral - Honey Cup -- Oral - Nectar Teaspoon -- Oral - Nectar Cup -- Oral - Nectar Straw -- Oral - Thin Teaspoon -- Oral - Thin Cup -- Oral - Thin Straw -- Oral - Puree -- Oral - Mech Soft -- Oral - Regular -- Oral - Multi-Consistency -- Oral - Pill -- Oral Phase - Comment --  [facility] IP PHARYNGEAL PHASE 10/30/2018 Pharyngeal Phase Impaired Pharyngeal- Pudding Teaspoon -- Pharyngeal -- Pharyngeal- Pudding Cup -- Pharyngeal -- Pharyngeal- Honey Teaspoon --  Pharyngeal -- Pharyngeal- Honey Cup -- Pharyngeal -- Pharyngeal- Nectar Teaspoon -- Pharyngeal -- Pharyngeal- Nectar Cup -- Pharyngeal -- Pharyngeal- Nectar Straw -- Pharyngeal -- Pharyngeal- Thin Teaspoon Delayed swallow initiation-pyriform sinuses;Penetration/Aspiration before swallow Pharyngeal Material enters airway, remains ABOVE vocal cords then ejected out;Material does not enter airway Pharyngeal- Thin Cup Delayed swallow initiation-vallecula;Delayed swallow initiation-pyriform sinuses Pharyngeal -- Pharyngeal- Thin Straw Delayed swallow initiation-pyriform sinuses Pharyngeal -- Pharyngeal- Puree WFL Pharyngeal -- Pharyngeal- Mechanical Soft -- Pharyngeal -- Pharyngeal- Regular WFL Pharyngeal -- Pharyngeal- Multi-consistency -- Pharyngeal -- Pharyngeal- Pill Penetration/Aspiration during swallow Pharyngeal Material enters airway, remains ABOVE vocal cords then ejected out Pharyngeal Comment --  [facility]L IP CERVICAL ESOPHAGEAL PHASE 10/30/2018 Cervical Esophageal Phase Impaired Pudding Teaspoon -- Pudding Cup -- Honey Teaspoon -- Honey Cup -- Nectar Teaspoon -- Nectar Cup -- Nectar Straw -- Thin Teaspoon Prominent cricopharyngeal segment;Other (Comment) Thin Cup -- Thin Straw -- Puree -- Mechanical Soft -- Regular -- Multi-consistency -- Pill -- Cervical Esophageal Comment -- Thank you, Genene Churn, [place] PORTER,DABNEY 10/30/2018, 12:45 PM                ELIGIBLE FOR AVAILABLE RESEARCH PROTOCOL: no   ASSESSMENT: 85 y.o. [place] woman status post left ectomy and sentinel lymph node sampling 03/12/2012 for a  pT1b pN0, stage IA invasive ductal carcinoma, grade 3, strongly estrogen and progesterone receptor negative, HER-2 nonamplified, with an MIB-1 of 15%  (a) additional surgery for margin clearance was successful 03/22/2012  (1) the patient opted against adjuvant radiation  (2) anastrozole started March 2014, discontinued 04/03/2018 with evidence of recurrence  RECURRENT DISEASE:  February 2020 (  3) status post left breast retroareolar biopsy 03/05/2018 for a clinical T1c N1, stage IIA invasive ductal carcinoma, estrogen and progesterone receptor positive, HER-2 not amplified, with an MIB-1 of 20%.  (4) spread to skin/chest wall on left noted 04/03/2018  (a) chest CT 04/11/2018 scan showed nonspecific bilateral axillary adenopathy   (B) bone scan 04/11/2018 shows only degenerative disease  (b) CA 27-29 not informative 9 was 25.6 on 04/26/2018)  (d) biopsy of 2 left breast skin nodules 04/04/2018 shows invasive carcinoma, estrogen receptor 100% positive, progesterone receptor and HER-2 negative, with an MIB-1 of 30%.  (5) started fulvestrant 04/12/2018  (a) added palbociclib 04/26/2018 at 125 mg p.o. daily, 21 days on, 7 days off   (1) dose decreased to 75 mg daily, 21/03 September 2018   (2) dose decreased to 75 mg every other day, 21/7 with October 2020 cycle  (b) tamoxifen added and fulvestrant changed to every 8 weeks after 08/13/2018 dose to minimize exposure during the pandemic  (c) fulvestrant return to every 4 weeks beginning with 11/25/2018 dose  (6) CT of the head, chest, abdomen, and pelvis August/September 2020 shows no evidence of metastatic disease outside of the left breast  PLAN: Jamice is tolerating her treatment generally well.  I think there has been minimal but measurable disease progression as described above, on the skin of the left breast.  There is no evidence of disseminated disease by multiple scans (noncontrast).  Accordingly we are going to go back to every 4 weeks fulvestrant beginning today.  I am going to continue the tamoxifen.  We are dropping the palbociclib to 75 mg every other day since her ANC today was less than 1.0.  She will start the 75 every other day cycle on 12/07/2018  I do not have a simple explanation for her jerking motions.  I was not aware that she has a history of cirrhosis, secondary to fatty liver.  I am checking an ammonia  level today which might partly explain her prior encephalopathy, but of course infection, medications, poor blood flow to the brain, and any number of other explanations are also possible.  I think it will be helpful for her to have a thorough neurologic evaluation  We will try to see her every 4 weeks with her Faslodex shot to check her lab work and make sure that there is no clear disease progression.  If there is we will consider switching to capecitabine.  They know to call for any other issue that may develop before the next visit here.   Milania Haubner, Virgie Dad, MD  11/25/18 3:01 PM Medical Oncology and Hematology [facility] [place], [place] [address] Tel. [phone]    Fax. [phone]  I, Jacqualyn Posey am acting as a Education administrator for Chauncey Cruel, MD.   I, Lurline Del MD, have reviewed the above documentation for accuracy and completeness, and I agree with the above.

## 2018-11-25 ENCOUNTER — Inpatient Hospital Stay: Payer: Medicare HMO | Attending: Oncology | Admitting: Oncology

## 2018-11-25 ENCOUNTER — Inpatient Hospital Stay: Payer: Medicare HMO

## 2018-11-25 ENCOUNTER — Other Ambulatory Visit: Payer: Self-pay

## 2018-11-25 VITALS — BP 110/60 | HR 92 | Temp 98.9°F | Resp 18 | Ht 62.0 in | Wt 202.7 lb

## 2018-11-25 DIAGNOSIS — R918 Other nonspecific abnormal finding of lung field: Secondary | ICD-10-CM | POA: Insufficient documentation

## 2018-11-25 DIAGNOSIS — E785 Hyperlipidemia, unspecified: Secondary | ICD-10-CM | POA: Insufficient documentation

## 2018-11-25 DIAGNOSIS — C50812 Malignant neoplasm of overlapping sites of left female breast: Secondary | ICD-10-CM | POA: Diagnosis not present

## 2018-11-25 DIAGNOSIS — Z79818 Long term (current) use of other agents affecting estrogen receptors and estrogen levels: Secondary | ICD-10-CM | POA: Diagnosis not present

## 2018-11-25 DIAGNOSIS — C50312 Malignant neoplasm of lower-inner quadrant of left female breast: Secondary | ICD-10-CM | POA: Diagnosis not present

## 2018-11-25 DIAGNOSIS — Z79810 Long term (current) use of selective estrogen receptor modulators (SERMs): Secondary | ICD-10-CM | POA: Insufficient documentation

## 2018-11-25 DIAGNOSIS — K7460 Unspecified cirrhosis of liver: Secondary | ICD-10-CM | POA: Diagnosis not present

## 2018-11-25 DIAGNOSIS — G253 Myoclonus: Secondary | ICD-10-CM | POA: Insufficient documentation

## 2018-11-25 DIAGNOSIS — IMO0002 Reserved for concepts with insufficient information to code with codable children: Secondary | ICD-10-CM

## 2018-11-25 DIAGNOSIS — Z85828 Personal history of other malignant neoplasm of skin: Secondary | ICD-10-CM | POA: Insufficient documentation

## 2018-11-25 DIAGNOSIS — C50012 Malignant neoplasm of nipple and areola, left female breast: Secondary | ICD-10-CM | POA: Diagnosis not present

## 2018-11-25 DIAGNOSIS — C50912 Malignant neoplasm of unspecified site of left female breast: Secondary | ICD-10-CM

## 2018-11-25 DIAGNOSIS — I517 Cardiomegaly: Secondary | ICD-10-CM | POA: Diagnosis not present

## 2018-11-25 DIAGNOSIS — K7469 Other cirrhosis of liver: Secondary | ICD-10-CM

## 2018-11-25 DIAGNOSIS — Z170 Estrogen receptor positive status [ER+]: Secondary | ICD-10-CM

## 2018-11-25 DIAGNOSIS — D649 Anemia, unspecified: Secondary | ICD-10-CM | POA: Diagnosis not present

## 2018-11-25 DIAGNOSIS — I129 Hypertensive chronic kidney disease with stage 1 through stage 4 chronic kidney disease, or unspecified chronic kidney disease: Secondary | ICD-10-CM | POA: Diagnosis not present

## 2018-11-25 DIAGNOSIS — N189 Chronic kidney disease, unspecified: Secondary | ICD-10-CM | POA: Insufficient documentation

## 2018-11-25 DIAGNOSIS — E119 Type 2 diabetes mellitus without complications: Secondary | ICD-10-CM | POA: Diagnosis not present

## 2018-11-25 DIAGNOSIS — M129 Arthropathy, unspecified: Secondary | ICD-10-CM | POA: Insufficient documentation

## 2018-11-25 DIAGNOSIS — M8580 Other specified disorders of bone density and structure, unspecified site: Secondary | ICD-10-CM | POA: Diagnosis not present

## 2018-11-25 DIAGNOSIS — Z79899 Other long term (current) drug therapy: Secondary | ICD-10-CM | POA: Insufficient documentation

## 2018-11-25 DIAGNOSIS — E1140 Type 2 diabetes mellitus with diabetic neuropathy, unspecified: Secondary | ICD-10-CM

## 2018-11-25 LAB — COMPREHENSIVE METABOLIC PANEL
ALT: 16 U/L (ref 0–44)
AST: 24 U/L (ref 15–41)
Albumin: 3.4 g/dL — ABNORMAL LOW (ref 3.5–5.0)
Alkaline Phosphatase: 109 U/L (ref 38–126)
Anion gap: 10 (ref 5–15)
BUN: 22 mg/dL (ref 8–23)
CO2: 29 mmol/L (ref 22–32)
Calcium: 8.8 mg/dL — ABNORMAL LOW (ref 8.9–10.3)
Chloride: 95 mmol/L — ABNORMAL LOW (ref 98–111)
Creatinine, Ser: 2.49 mg/dL — ABNORMAL HIGH (ref 0.44–1.00)
GFR calc Af Amer: 20 mL/min — ABNORMAL LOW (ref >60)
GFR calc non Af Amer: 17 mL/min — ABNORMAL LOW (ref >60)
Glucose, Bld: 490 mg/dL — ABNORMAL HIGH (ref 70–99)
Potassium: 4.1 mmol/L (ref 3.5–5.1)
Sodium: 134 mmol/L — ABNORMAL LOW (ref 135–145)
Total Bilirubin: 0.7 mg/dL (ref 0.3–1.2)
Total Protein: 6.5 g/dL (ref 6.5–8.1)

## 2018-11-25 LAB — CBC WITH DIFFERENTIAL/PLATELET
Abs Immature Granulocytes: 0.01 10*3/uL (ref 0.00–0.07)
Basophils Absolute: 0 10*3/uL (ref 0.0–0.1)
Basophils Relative: 2 %
Eosinophils Absolute: 0.1 10*3/uL (ref 0.0–0.5)
Eosinophils Relative: 3 %
HCT: 33.1 % — ABNORMAL LOW (ref 36.0–46.0)
Hemoglobin: 11.4 g/dL — ABNORMAL LOW (ref 12.0–15.0)
Immature Granulocytes: 1 %
Lymphocytes Relative: 45 %
Lymphs Abs: 0.9 10*3/uL (ref 0.7–4.0)
MCH: 39.4 pg — ABNORMAL HIGH (ref 26.0–34.0)
MCHC: 34.4 g/dL (ref 30.0–36.0)
MCV: 114.5 fL — ABNORMAL HIGH (ref 80.0–100.0)
Monocytes Absolute: 0.1 10*3/uL (ref 0.1–1.0)
Monocytes Relative: 5 %
Neutro Abs: 0.9 10*3/uL — ABNORMAL LOW (ref 1.7–7.7)
Neutrophils Relative %: 44 %
Platelets: 93 10*3/uL — ABNORMAL LOW (ref 150–400)
RBC: 2.89 MIL/uL — ABNORMAL LOW (ref 3.87–5.11)
RDW: 14.6 % (ref 11.5–15.5)
WBC: 1.9 10*3/uL — ABNORMAL LOW (ref 4.0–10.5)
nRBC: 0 % (ref 0.0–0.2)

## 2018-11-25 LAB — AMMONIA: Ammonia: 26 umol/L (ref 9–35)

## 2018-11-25 MED ORDER — FULVESTRANT 250 MG/5ML IM SOLN
500.0000 mg | Freq: Once | INTRAMUSCULAR | Status: AC
  Administered 2018-11-25: 15:00:00 500 mg via INTRAMUSCULAR

## 2018-11-25 MED ORDER — PALBOCICLIB 75 MG PO TABS: 75.0000 mg | ORAL_TABLET | ORAL | 6 refills | Status: DC

## 2018-11-26 ENCOUNTER — Encounter: Payer: Self-pay | Admitting: Neurology

## 2018-11-26 ENCOUNTER — Ambulatory Visit (HOSPITAL_COMMUNITY): Admission: RE | Admit: 2018-11-26 | Payer: Medicare HMO | Source: Ambulatory Visit

## 2018-11-26 ENCOUNTER — Other Ambulatory Visit: Payer: Self-pay | Admitting: Pharmacist

## 2018-11-26 DIAGNOSIS — I129 Hypertensive chronic kidney disease with stage 1 through stage 4 chronic kidney disease, or unspecified chronic kidney disease: Secondary | ICD-10-CM | POA: Diagnosis not present

## 2018-11-26 DIAGNOSIS — J189 Pneumonia, unspecified organism: Secondary | ICD-10-CM | POA: Diagnosis not present

## 2018-11-26 DIAGNOSIS — C50812 Malignant neoplasm of overlapping sites of left female breast: Secondary | ICD-10-CM

## 2018-11-26 DIAGNOSIS — Z170 Estrogen receptor positive status [ER+]: Secondary | ICD-10-CM

## 2018-11-26 DIAGNOSIS — E1122 Type 2 diabetes mellitus with diabetic chronic kidney disease: Secondary | ICD-10-CM | POA: Diagnosis not present

## 2018-11-26 DIAGNOSIS — J9601 Acute respiratory failure with hypoxia: Secondary | ICD-10-CM | POA: Diagnosis not present

## 2018-11-26 DIAGNOSIS — D631 Anemia in chronic kidney disease: Secondary | ICD-10-CM | POA: Diagnosis not present

## 2018-11-26 DIAGNOSIS — E1165 Type 2 diabetes mellitus with hyperglycemia: Secondary | ICD-10-CM | POA: Diagnosis not present

## 2018-11-26 DIAGNOSIS — N184 Chronic kidney disease, stage 4 (severe): Secondary | ICD-10-CM | POA: Diagnosis not present

## 2018-11-26 DIAGNOSIS — G9341 Metabolic encephalopathy: Secondary | ICD-10-CM | POA: Diagnosis not present

## 2018-11-26 DIAGNOSIS — C50012 Malignant neoplasm of nipple and areola, left female breast: Secondary | ICD-10-CM | POA: Diagnosis not present

## 2018-11-26 DIAGNOSIS — R531 Weakness: Secondary | ICD-10-CM | POA: Diagnosis not present

## 2018-11-26 MED ORDER — PALBOCICLIB 75 MG PO CAPS: ORAL_CAPSULE | ORAL | 6 refills | Status: DC

## 2018-11-26 NOTE — Telephone Encounter (Signed)
Oral Oncology Pharmacist Encounter  Ibrance (palbociclib) frequency has been decreased to every other day for 3 weeks on, 1 week off, repeated every 4 weeks. Palbociclib has recently changed formulation from capsules to tablets, but the tablets are packaged such that partial orders are difficult to fill. New prescription for palbociclib 75 mg capsules, take 1 capsule every other day for 3 weeks on, 1 week off, quantity #11, refills=6, has been e-scribed to [facility].   Johny Drilling, PharmD, BCPS, BCOP  11/26/2018 3:03 PM Oral Oncology Clinic [phone]

## 2018-11-27 ENCOUNTER — Telehealth: Payer: Self-pay | Admitting: Oncology

## 2018-11-27 MED FILL — IBRANCE 75 MG CAPSULE: 75 | 28 days supply | Qty: 11 | Fill #0

## 2018-11-27 NOTE — Telephone Encounter (Signed)
I talk with patient regarding schedule  

## 2018-11-28 DIAGNOSIS — I129 Hypertensive chronic kidney disease with stage 1 through stage 4 chronic kidney disease, or unspecified chronic kidney disease: Secondary | ICD-10-CM | POA: Diagnosis not present

## 2018-11-28 DIAGNOSIS — C50012 Malignant neoplasm of nipple and areola, left female breast: Secondary | ICD-10-CM | POA: Diagnosis not present

## 2018-11-28 DIAGNOSIS — D631 Anemia in chronic kidney disease: Secondary | ICD-10-CM | POA: Diagnosis not present

## 2018-11-28 DIAGNOSIS — E1122 Type 2 diabetes mellitus with diabetic chronic kidney disease: Secondary | ICD-10-CM | POA: Diagnosis not present

## 2018-11-28 DIAGNOSIS — J189 Pneumonia, unspecified organism: Secondary | ICD-10-CM | POA: Diagnosis not present

## 2018-11-28 DIAGNOSIS — N184 Chronic kidney disease, stage 4 (severe): Secondary | ICD-10-CM | POA: Diagnosis not present

## 2018-11-28 DIAGNOSIS — R531 Weakness: Secondary | ICD-10-CM | POA: Diagnosis not present

## 2018-11-28 DIAGNOSIS — J9601 Acute respiratory failure with hypoxia: Secondary | ICD-10-CM | POA: Diagnosis not present

## 2018-11-28 DIAGNOSIS — E1165 Type 2 diabetes mellitus with hyperglycemia: Secondary | ICD-10-CM | POA: Diagnosis not present

## 2018-11-28 DIAGNOSIS — G9341 Metabolic encephalopathy: Secondary | ICD-10-CM | POA: Diagnosis not present

## 2018-11-29 ENCOUNTER — Encounter: Payer: Self-pay | Admitting: Podiatry

## 2018-11-29 ENCOUNTER — Ambulatory Visit: Payer: Medicare HMO | Admitting: Podiatry

## 2018-11-29 ENCOUNTER — Other Ambulatory Visit: Payer: Self-pay

## 2018-11-29 DIAGNOSIS — M79676 Pain in unspecified toe(s): Secondary | ICD-10-CM

## 2018-11-29 DIAGNOSIS — B351 Tinea unguium: Secondary | ICD-10-CM | POA: Diagnosis not present

## 2018-11-29 DIAGNOSIS — E1142 Type 2 diabetes mellitus with diabetic polyneuropathy: Secondary | ICD-10-CM

## 2018-11-29 NOTE — Patient Instructions (Signed)
Diabetes Mellitus and Foot Care Foot care is an important part of your health, especially when you have diabetes. Diabetes may cause you to have problems because of poor blood flow (circulation) to your feet and legs, which can cause your skin to:  Become thinner and drier.  Break more easily.  Heal more slowly.  Peel and crack. You may also have nerve damage (neuropathy) in your legs and feet, causing decreased feeling in them. This means that you may not notice minor injuries to your feet that could lead to more serious problems. Noticing and addressing any potential problems early is the best way to prevent future foot problems. How to care for your feet Foot hygiene  Wash your feet daily with warm water and mild soap. Do not use hot water. Then, pat your feet and the areas between your toes until they are completely dry. Do not soak your feet as this can dry your skin.  Trim your toenails straight across. Do not dig under them or around the cuticle. File the edges of your nails with an emery board or nail file.  Apply a moisturizing lotion or petroleum jelly to the skin on your feet and to dry, brittle toenails. Use lotion that does not contain alcohol and is unscented. Do not apply lotion between your toes. Shoes and socks  Wear clean socks or stockings every day. Make sure they are not too tight. Do not wear knee-high stockings since they may decrease blood flow to your legs.  Wear shoes that fit properly and have enough cushioning. Always look in your shoes before you put them on to be sure there are no objects inside.  To break in new shoes, wear them for just a few hours a day. This prevents injuries on your feet. Wounds, scrapes, corns, and calluses  Check your feet daily for blisters, cuts, bruises, sores, and redness. If you cannot see the bottom of your feet, use a mirror or ask someone for help.  Do not cut corns or calluses or try to remove them with medicine.  If you  find a minor scrape, cut, or break in the skin on your feet, keep it and the skin around it clean and dry. You may clean these areas with mild soap and water. Do not clean the area with peroxide, alcohol, or iodine.  If you have a wound, scrape, corn, or callus on your foot, look at it several times a day to make sure it is healing and not infected. Check for: ? Redness, swelling, or pain. ? Fluid or blood. ? Warmth. ? Pus or a bad smell. General instructions  Do not cross your legs. This may decrease blood flow to your feet.  Do not use heating pads or hot water bottles on your feet. They may burn your skin. If you have lost feeling in your feet or legs, you may not know this is happening until it is too late.  Protect your feet from hot and cold by wearing shoes, such as at the beach or on hot pavement.  Schedule a complete foot exam at least once a year (annually) or more often if you have foot problems. If you have foot problems, report any cuts, sores, or bruises to your health care provider immediately. Contact a health care provider if:  You have a medical condition that increases your risk of infection and you have any cuts, sores, or bruises on your feet.  You have an injury that is not   healing.  You have redness on your legs or feet.  You feel burning or tingling in your legs or feet.  You have pain or cramps in your legs and feet.  Your legs or feet are numb.  Your feet always feel cold.  You have pain around a toenail. Get help right away if:  You have a wound, scrape, corn, or callus on your foot and: ? You have pain, swelling, or redness that gets worse. ? You have fluid or blood coming from the wound, scrape, corn, or callus. ? Your wound, scrape, corn, or callus feels warm to the touch. ? You have pus or a bad smell coming from the wound, scrape, corn, or callus. ? You have a fever. ? You have a red line going up your leg. Summary  Check your feet every day  for cuts, sores, red spots, swelling, and blisters.  Moisturize feet and legs daily.  Wear shoes that fit properly and have enough cushioning.  If you have foot problems, report any cuts, sores, or bruises to your health care provider immediately.  Schedule a complete foot exam at least once a year (annually) or more often if you have foot problems. This information is not intended to replace advice given to you by your health care provider. Make sure you discuss any questions you have with your health care provider. Document Released: 02/11/2000 Document Revised: 03/28/2017 Document Reviewed: 03/17/2016 Elsevier Patient Education  2020 [employer]   Onychomycosis/Fungal Toenails  WHAT IS IT? An infection that lies within the keratin of your nail plate that is caused by a fungus.  WHY ME? Fungal infections affect all ages, sexes, races, and creeds.  There may be many factors that predispose you to a fungal infection such as age, coexisting medical conditions such as diabetes, or an autoimmune disease; stress, medications, fatigue, genetics, etc.  Bottom line: fungus thrives in a warm, moist environment and your shoes offer such a location.  IS IT CONTAGIOUS? Theoretically, yes.  You do not want to share shoes, nail clippers or files with someone who has fungal toenails.  Walking around barefoot in the same room or sleeping in the same bed is unlikely to transfer the organism.  It is important to realize, however, that fungus can spread easily from one nail to the next on the same foot.  HOW DO WE TREAT THIS?  There are several ways to treat this condition.  Treatment may depend on many factors such as age, medications, pregnancy, liver and kidney conditions, etc.  It is best to ask your doctor which options are available to you.  1. No treatment.   Unlike many other medical concerns, you can live with this condition.  However for many people this can be a painful condition and may lead to  ingrown toenails or a bacterial infection.  It is recommended that you keep the nails cut short to help reduce the amount of fungal nail. 2. Topical treatment.  These range from herbal remedies to prescription strength nail lacquers.  About 40-50% effective, topicals require twice daily application for approximately 9 to 12 months or until an entirely new nail has grown out.  The most effective topicals are medical grade medications available through physicians offices. 3. Oral antifungal medications.  With an 80-90% cure rate, the most common oral medication requires 3 to 4 months of therapy and stays in your system for a year as the new nail grows out.  Oral antifungal medications do require   blood work to make sure it is a safe drug for you.  A liver function panel will be performed prior to starting the medication and after the first month of treatment.  It is important to have the blood work performed to avoid any harmful side effects.  In general, this medication safe but blood work is required. 4. Laser Therapy.  This treatment is performed by applying a specialized laser to the affected nail plate.  This therapy is noninvasive, fast, and non-painful.  It is not covered by insurance and is therefore, out of pocket.  The results have been very good with a 80-95% cure rate.  [facility] is the only practice in the area to offer this therapy. 5. Permanent Nail Avulsion.  Removing the entire nail so that a new nail will not grow back. 

## 2018-12-01 NOTE — Progress Notes (Signed)
Subjective:  [name] presents to clinic today with cc of  painful, thick, discolored, elongated toenails 1-5 b/l that become tender and cannot cut because of thickness. Pain is aggravated when wearing enclosed shoe gear.  Sinda Du, MD is her PCP.   Current Outpatient Medications on File Prior to Visit  Medication Sig Dispense Refill  . acyclovir (ZOVIRAX) 400 MG tablet Take 1 tablet (400 mg total) by mouth 2 (two) times daily. 120 tablet 3  . ALPHAGAN P 0.1 % SOLN Place 1 drop into both eyes 2 (two) times daily.     [name] Kitchen amLODipine (NORVASC) 5 MG tablet Take 5 mg by mouth every morning.     [name] Kitchen aspirin EC 81 MG tablet Take 81 mg by mouth daily.    [name] Kitchen atorvastatin (LIPITOR) 10 MG tablet Take 10 mg by mouth every evening.     . Bacillus Coagulans-Inulin (PROBIOTIC FORMULA PO) Take 1 capsule by mouth daily.    . BD PEN NEEDLE NANO U/F 32G X 4 MM MISC     . cyanocobalamin (,VITAMIN B-12,) 1000 MCG/ML injection Inject 1,000 mcg into the muscle every 30 (thirty) days.     . dorzolamide (TRUSOPT) 2 % ophthalmic solution Place 1 drop into both eyes 2 (two) times daily.   4  . EPIPEN 2-PAK 0.3 MG/0.3ML SOAJ injection Inject 0.3 mg into the muscle as needed for anaphylaxis.     . fulvestrant (FASLODEX) 250 MG/5ML injection Inject 500 mg into the muscle every 14 (fourteen) days. One injection each buttock over 1-2 minutes. Warm prior to use.    . gabapentin (NEURONTIN) 600 MG tablet Take 600 mg by mouth daily.     [name] Kitchen glipiZIDE (GLUCOTROL) 5 MG tablet Take 5 mg by mouth daily before breakfast.     . guaiFENesin-dextromethorphan (ROBITUSSIN DM) 100-10 MG/5ML syrup Take 5 mLs by mouth every 4 (four) hours as needed for cough. 118 mL 0  . ipratropium-albuterol (DUONEB) 0.5-2.5 (3) MG/3ML SOLN Take 3 mLs by nebulization 3 (three) times daily. 360 mL 3  . Lancets (ONETOUCH DELICA PLUS [id number]) MISC     . latanoprost (XALATAN) 0.005 % ophthalmic solution Place 1 drop into both eyes at bedtime.      . methocarbamol (ROBAXIN) 500 MG tablet Take 500 mg by mouth 2 (two) times daily as needed for muscle spasms.    . metoprolol succinate (TOPROL-XL) 100 MG 24 hr tablet Take 100 mg by mouth every morning. Take with or immediately following a meal.    . mirtazapine (REMERON) 15 MG tablet Take 15 mg by mouth at bedtime.     [name] Kitchen MYRBETRIQ 50 MG TB24 tablet Take 50 mg by mouth daily.     [name] Kitchen omeprazole (PRILOSEC) 20 MG capsule Take 20 mg by mouth daily.    Glory Rosebush ULTRA test strip     . palbociclib (IBRANCE) 75 MG capsule Take 1 capsule (75 mg) by mouth once every other day with food. Take for 21 days on, 7 days off, repeat every 28 days. 11 capsule 6  . Polyethyl Glycol-Propyl Glycol (SYSTANE ULTRA) 0.4-0.3 % SOLN Place 1-2 drops into both eyes daily as needed (for itching eyes).    . predniSONE (STERAPRED UNI-PAK 21 TAB) 10 MG (21) TBPK tablet Take by package instructions (Patient not taking: Reported on 11/19/2018) 21 tablet o  . tamoxifen (NOLVADEX) 20 MG tablet     . torsemide (DEMADEX) 20 MG tablet Take 1 tablet (20 mg total) by mouth daily. 30 tablet  5  . TRESIBA FLEXTOUCH 100 UNIT/ML SOPN FlexTouch Pen Inject 0.5 mLs (50 Units total) into the skin daily. 5 pen 5   No current facility-administered medications on file prior to visit.      Allergies  Allergen Reactions  . Bee Venom Anaphylaxis and Swelling  . Hydromorphone Hcl Other (See Comments)    Agitation, Confusion  . Demerol Nausea Only     Objective: Physical Examination:  Vascular Examination: Capillary refill time <x 10 digits.  Palpable DP/PT pulses b/l.  Digital hair absent b/l.  Chronic LE edema b/l. No breaks in skin. No weeping b/l.  Skin temperature gradient WNL b/l.  Dermatological Examination: Skin with normal turgor, texture and tone b/l.  No open wounds b/l.  No interdigital macerations noted b/l.  Elongated, thick, discolored brittle toenails with subungual debris and pain on dorsal palpation of  nailbeds 1-5 b/l.  Musculoskeletal Examination: Muscle strength 5/5 to all muscle groups b/l.  No pain, crepitus or joint discomfort with active/passive ROM.  Neurological Examination: Sensation diminished b/l with 10 gram monofilament.  Vibratory sensation diminished b/l.  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 Toenails 1-5 b/l were debrided in length and girth without iatrogenic laceration. 2.  Continue soft, supportive shoe gear daily. 3.  Report any pedal injuries to medical professional. 4.  Follow up 3 months. 5.  Patient/POA to call should there be a question/concern in there interim.

## 2018-12-03 ENCOUNTER — Other Ambulatory Visit: Payer: Medicare HMO

## 2018-12-03 ENCOUNTER — Ambulatory Visit: Payer: Medicare HMO

## 2018-12-03 ENCOUNTER — Ambulatory Visit: Payer: Medicare HMO | Admitting: Adult Health

## 2018-12-03 DIAGNOSIS — E1165 Type 2 diabetes mellitus with hyperglycemia: Secondary | ICD-10-CM | POA: Diagnosis not present

## 2018-12-03 DIAGNOSIS — N184 Chronic kidney disease, stage 4 (severe): Secondary | ICD-10-CM | POA: Diagnosis not present

## 2018-12-03 DIAGNOSIS — J189 Pneumonia, unspecified organism: Secondary | ICD-10-CM | POA: Diagnosis not present

## 2018-12-03 DIAGNOSIS — D631 Anemia in chronic kidney disease: Secondary | ICD-10-CM | POA: Diagnosis not present

## 2018-12-03 DIAGNOSIS — I129 Hypertensive chronic kidney disease with stage 1 through stage 4 chronic kidney disease, or unspecified chronic kidney disease: Secondary | ICD-10-CM | POA: Diagnosis not present

## 2018-12-03 DIAGNOSIS — E1122 Type 2 diabetes mellitus with diabetic chronic kidney disease: Secondary | ICD-10-CM | POA: Diagnosis not present

## 2018-12-03 DIAGNOSIS — R531 Weakness: Secondary | ICD-10-CM | POA: Diagnosis not present

## 2018-12-03 DIAGNOSIS — C50012 Malignant neoplasm of nipple and areola, left female breast: Secondary | ICD-10-CM | POA: Diagnosis not present

## 2018-12-03 DIAGNOSIS — J9601 Acute respiratory failure with hypoxia: Secondary | ICD-10-CM | POA: Diagnosis not present

## 2018-12-03 DIAGNOSIS — G9341 Metabolic encephalopathy: Secondary | ICD-10-CM | POA: Diagnosis not present

## 2018-12-04 DIAGNOSIS — E1165 Type 2 diabetes mellitus with hyperglycemia: Secondary | ICD-10-CM | POA: Diagnosis not present

## 2018-12-04 DIAGNOSIS — R69 Illness, unspecified: Secondary | ICD-10-CM | POA: Diagnosis not present

## 2018-12-04 DIAGNOSIS — I129 Hypertensive chronic kidney disease with stage 1 through stage 4 chronic kidney disease, or unspecified chronic kidney disease: Secondary | ICD-10-CM | POA: Diagnosis not present

## 2018-12-04 DIAGNOSIS — N184 Chronic kidney disease, stage 4 (severe): Secondary | ICD-10-CM | POA: Diagnosis not present

## 2018-12-04 DIAGNOSIS — R531 Weakness: Secondary | ICD-10-CM | POA: Diagnosis not present

## 2018-12-04 DIAGNOSIS — J9601 Acute respiratory failure with hypoxia: Secondary | ICD-10-CM | POA: Diagnosis not present

## 2018-12-04 DIAGNOSIS — G9341 Metabolic encephalopathy: Secondary | ICD-10-CM | POA: Diagnosis not present

## 2018-12-04 DIAGNOSIS — J189 Pneumonia, unspecified organism: Secondary | ICD-10-CM | POA: Diagnosis not present

## 2018-12-04 DIAGNOSIS — D631 Anemia in chronic kidney disease: Secondary | ICD-10-CM | POA: Diagnosis not present

## 2018-12-04 DIAGNOSIS — C50012 Malignant neoplasm of nipple and areola, left female breast: Secondary | ICD-10-CM | POA: Diagnosis not present

## 2018-12-04 DIAGNOSIS — E1122 Type 2 diabetes mellitus with diabetic chronic kidney disease: Secondary | ICD-10-CM | POA: Diagnosis not present

## 2018-12-05 DIAGNOSIS — C50012 Malignant neoplasm of nipple and areola, left female breast: Secondary | ICD-10-CM | POA: Diagnosis not present

## 2018-12-05 DIAGNOSIS — N184 Chronic kidney disease, stage 4 (severe): Secondary | ICD-10-CM | POA: Diagnosis not present

## 2018-12-05 DIAGNOSIS — J9601 Acute respiratory failure with hypoxia: Secondary | ICD-10-CM | POA: Diagnosis not present

## 2018-12-05 DIAGNOSIS — D631 Anemia in chronic kidney disease: Secondary | ICD-10-CM | POA: Diagnosis not present

## 2018-12-05 DIAGNOSIS — G9341 Metabolic encephalopathy: Secondary | ICD-10-CM | POA: Diagnosis not present

## 2018-12-05 DIAGNOSIS — E1165 Type 2 diabetes mellitus with hyperglycemia: Secondary | ICD-10-CM | POA: Diagnosis not present

## 2018-12-05 DIAGNOSIS — J189 Pneumonia, unspecified organism: Secondary | ICD-10-CM | POA: Diagnosis not present

## 2018-12-05 DIAGNOSIS — I129 Hypertensive chronic kidney disease with stage 1 through stage 4 chronic kidney disease, or unspecified chronic kidney disease: Secondary | ICD-10-CM | POA: Diagnosis not present

## 2018-12-05 DIAGNOSIS — R531 Weakness: Secondary | ICD-10-CM | POA: Diagnosis not present

## 2018-12-05 DIAGNOSIS — E1122 Type 2 diabetes mellitus with diabetic chronic kidney disease: Secondary | ICD-10-CM | POA: Diagnosis not present

## 2018-12-06 DIAGNOSIS — E1122 Type 2 diabetes mellitus with diabetic chronic kidney disease: Secondary | ICD-10-CM | POA: Diagnosis not present

## 2018-12-06 DIAGNOSIS — D631 Anemia in chronic kidney disease: Secondary | ICD-10-CM | POA: Diagnosis not present

## 2018-12-06 DIAGNOSIS — R531 Weakness: Secondary | ICD-10-CM | POA: Diagnosis not present

## 2018-12-06 DIAGNOSIS — I129 Hypertensive chronic kidney disease with stage 1 through stage 4 chronic kidney disease, or unspecified chronic kidney disease: Secondary | ICD-10-CM | POA: Diagnosis not present

## 2018-12-06 DIAGNOSIS — N184 Chronic kidney disease, stage 4 (severe): Secondary | ICD-10-CM | POA: Diagnosis not present

## 2018-12-06 DIAGNOSIS — G9341 Metabolic encephalopathy: Secondary | ICD-10-CM | POA: Diagnosis not present

## 2018-12-06 DIAGNOSIS — C50012 Malignant neoplasm of nipple and areola, left female breast: Secondary | ICD-10-CM | POA: Diagnosis not present

## 2018-12-06 DIAGNOSIS — J189 Pneumonia, unspecified organism: Secondary | ICD-10-CM | POA: Diagnosis not present

## 2018-12-06 DIAGNOSIS — J9601 Acute respiratory failure with hypoxia: Secondary | ICD-10-CM | POA: Diagnosis not present

## 2018-12-06 DIAGNOSIS — E1165 Type 2 diabetes mellitus with hyperglycemia: Secondary | ICD-10-CM | POA: Diagnosis not present

## 2018-12-09 DIAGNOSIS — E1165 Type 2 diabetes mellitus with hyperglycemia: Secondary | ICD-10-CM | POA: Diagnosis not present

## 2018-12-09 DIAGNOSIS — E1122 Type 2 diabetes mellitus with diabetic chronic kidney disease: Secondary | ICD-10-CM | POA: Diagnosis not present

## 2018-12-09 DIAGNOSIS — C50012 Malignant neoplasm of nipple and areola, left female breast: Secondary | ICD-10-CM | POA: Diagnosis not present

## 2018-12-09 DIAGNOSIS — G9341 Metabolic encephalopathy: Secondary | ICD-10-CM | POA: Diagnosis not present

## 2018-12-09 DIAGNOSIS — D631 Anemia in chronic kidney disease: Secondary | ICD-10-CM | POA: Diagnosis not present

## 2018-12-09 DIAGNOSIS — J9601 Acute respiratory failure with hypoxia: Secondary | ICD-10-CM | POA: Diagnosis not present

## 2018-12-09 DIAGNOSIS — R531 Weakness: Secondary | ICD-10-CM | POA: Diagnosis not present

## 2018-12-09 DIAGNOSIS — J189 Pneumonia, unspecified organism: Secondary | ICD-10-CM | POA: Diagnosis not present

## 2018-12-09 DIAGNOSIS — I129 Hypertensive chronic kidney disease with stage 1 through stage 4 chronic kidney disease, or unspecified chronic kidney disease: Secondary | ICD-10-CM | POA: Diagnosis not present

## 2018-12-09 DIAGNOSIS — N184 Chronic kidney disease, stage 4 (severe): Secondary | ICD-10-CM | POA: Diagnosis not present

## 2018-12-10 DIAGNOSIS — R531 Weakness: Secondary | ICD-10-CM | POA: Diagnosis not present

## 2018-12-10 DIAGNOSIS — G9341 Metabolic encephalopathy: Secondary | ICD-10-CM | POA: Diagnosis not present

## 2018-12-10 DIAGNOSIS — J9601 Acute respiratory failure with hypoxia: Secondary | ICD-10-CM | POA: Diagnosis not present

## 2018-12-10 DIAGNOSIS — I129 Hypertensive chronic kidney disease with stage 1 through stage 4 chronic kidney disease, or unspecified chronic kidney disease: Secondary | ICD-10-CM | POA: Diagnosis not present

## 2018-12-10 DIAGNOSIS — N184 Chronic kidney disease, stage 4 (severe): Secondary | ICD-10-CM | POA: Diagnosis not present

## 2018-12-10 DIAGNOSIS — M545 Low back pain: Secondary | ICD-10-CM | POA: Diagnosis not present

## 2018-12-10 DIAGNOSIS — E1165 Type 2 diabetes mellitus with hyperglycemia: Secondary | ICD-10-CM | POA: Diagnosis not present

## 2018-12-10 DIAGNOSIS — J189 Pneumonia, unspecified organism: Secondary | ICD-10-CM | POA: Diagnosis not present

## 2018-12-10 DIAGNOSIS — C50919 Malignant neoplasm of unspecified site of unspecified female breast: Secondary | ICD-10-CM | POA: Diagnosis not present

## 2018-12-10 DIAGNOSIS — D631 Anemia in chronic kidney disease: Secondary | ICD-10-CM | POA: Diagnosis not present

## 2018-12-10 DIAGNOSIS — C50012 Malignant neoplasm of nipple and areola, left female breast: Secondary | ICD-10-CM | POA: Diagnosis not present

## 2018-12-10 DIAGNOSIS — E1122 Type 2 diabetes mellitus with diabetic chronic kidney disease: Secondary | ICD-10-CM | POA: Diagnosis not present

## 2018-12-11 DIAGNOSIS — G9341 Metabolic encephalopathy: Secondary | ICD-10-CM | POA: Diagnosis not present

## 2018-12-11 DIAGNOSIS — Z23 Encounter for immunization: Secondary | ICD-10-CM | POA: Diagnosis not present

## 2018-12-11 DIAGNOSIS — J189 Pneumonia, unspecified organism: Secondary | ICD-10-CM | POA: Diagnosis not present

## 2018-12-11 DIAGNOSIS — N184 Chronic kidney disease, stage 4 (severe): Secondary | ICD-10-CM | POA: Diagnosis not present

## 2018-12-11 DIAGNOSIS — R531 Weakness: Secondary | ICD-10-CM | POA: Diagnosis not present

## 2018-12-11 DIAGNOSIS — C50012 Malignant neoplasm of nipple and areola, left female breast: Secondary | ICD-10-CM | POA: Diagnosis not present

## 2018-12-11 DIAGNOSIS — I129 Hypertensive chronic kidney disease with stage 1 through stage 4 chronic kidney disease, or unspecified chronic kidney disease: Secondary | ICD-10-CM | POA: Diagnosis not present

## 2018-12-11 DIAGNOSIS — D631 Anemia in chronic kidney disease: Secondary | ICD-10-CM | POA: Diagnosis not present

## 2018-12-11 DIAGNOSIS — J9601 Acute respiratory failure with hypoxia: Secondary | ICD-10-CM | POA: Diagnosis not present

## 2018-12-11 DIAGNOSIS — E1165 Type 2 diabetes mellitus with hyperglycemia: Secondary | ICD-10-CM | POA: Diagnosis not present

## 2018-12-11 DIAGNOSIS — E1122 Type 2 diabetes mellitus with diabetic chronic kidney disease: Secondary | ICD-10-CM | POA: Diagnosis not present

## 2018-12-12 DIAGNOSIS — E1122 Type 2 diabetes mellitus with diabetic chronic kidney disease: Secondary | ICD-10-CM | POA: Diagnosis not present

## 2018-12-12 DIAGNOSIS — E1165 Type 2 diabetes mellitus with hyperglycemia: Secondary | ICD-10-CM | POA: Diagnosis not present

## 2018-12-12 DIAGNOSIS — D631 Anemia in chronic kidney disease: Secondary | ICD-10-CM | POA: Diagnosis not present

## 2018-12-12 DIAGNOSIS — J189 Pneumonia, unspecified organism: Secondary | ICD-10-CM | POA: Diagnosis not present

## 2018-12-12 DIAGNOSIS — N184 Chronic kidney disease, stage 4 (severe): Secondary | ICD-10-CM | POA: Diagnosis not present

## 2018-12-12 DIAGNOSIS — C50012 Malignant neoplasm of nipple and areola, left female breast: Secondary | ICD-10-CM | POA: Diagnosis not present

## 2018-12-12 DIAGNOSIS — G9341 Metabolic encephalopathy: Secondary | ICD-10-CM | POA: Diagnosis not present

## 2018-12-12 DIAGNOSIS — I129 Hypertensive chronic kidney disease with stage 1 through stage 4 chronic kidney disease, or unspecified chronic kidney disease: Secondary | ICD-10-CM | POA: Diagnosis not present

## 2018-12-12 DIAGNOSIS — R531 Weakness: Secondary | ICD-10-CM | POA: Diagnosis not present

## 2018-12-12 DIAGNOSIS — J9601 Acute respiratory failure with hypoxia: Secondary | ICD-10-CM | POA: Diagnosis not present

## 2018-12-22 NOTE — Progress Notes (Signed)
[name]  [phone] [phone]    ID: [name] DOB: 07-Aug-1933  MR#: [id number]  TKW#:[id number]  Patient Care Team: Sinda Du, MD as PCP - General (Internal Medicine) Gala Romney Cristopher Estimable, MD as Consulting Physician (Gastroenterology) Alexandrya Chim, Virgie Dad, MD as Consulting Physician (Oncology) Jovita Kussmaul, MD as Consulting Physician (General Surgery) Gery Pray, MD as Consulting Physician (Radiation Oncology) Erline Levine, MD as Consulting Physician (Neurosurgery) Chauncey Cruel, MD OTHER MD:   CHIEF COMPLAINT: Recurrent estrogen receptor positive breast cancer  CURRENT TREATMENT: FULVESTRANT; PALBOCICLIB  INTERVAL HISTORY: [name] returns today for follow-up and treatment of her recurrent estrogen receptor positive breast cancer accompanied by her daughter.   She continues on palbociclib.  Her dose was reduced to 75 mg every other day.  She is tolerating this much better.  She does not have the fatigue that she experienced with the earlier dose.  She also continues on fulvestrant, back to every 4 weeks.  This has never caused her any problems.    REVIEW OF SYSTEMS: [name] and her daughter just got back from a trip to the mountains which they enjoyed greatly.  They are keeping pandemic precautions.  [name] has no side effects that she is aware of from the current palbociclib dose.  She has had no unusual headaches, visual changes, cough, phlegm production, or pleurisy.  Sometimes food goes "the wrong way".  She is having normal bowel movements and bladder function for her and the Myrbetriq is working well. A detailed review of systems was otherwise stable.   HISTORY OF CURRENT ILLNESS: From the original intake note  [name] has a history of left breast invasive ductal carcinoma,  status post left lumpectomy and sentinel lymph node sampling 03/12/2012 for a pT1b pN0, stage IA invasive ductal carcinoma, grade 3,  strongly estrogen and progesterone receptor positive, HER-2 nonamplified, with an MIB-1-1 of 15%.  The single sentinel lymph node was negative.  There was a positive margin.  She had reexcision on 03/22/2012 for what proved to be a single residual focus of intermediate grade ductal carcinoma in situ.  This was close to the lateral inked margin which was however negative.  She saw radiation oncology (Drs. Orlene Erm and Valere Dross) who felt adjuvant endocrine therapy alone would be adequate. She started anastrozole under Dr. Jacquiline Doe at [facility], on which she continues.  More recently she underwent routine bilateral diagnostic mammography with tomography and left breast ultrasonography at [facility] on 02/26/2018 showing: highly suspicious 1.6 cm retroareolar left breast mass, and single abnormal left axillary lymph node.  Accordingly on 03/05/2018 she proceeded to biopsy of the left breast area in question. The pathology ([id number]) from this procedure showed: invasive ductal carcinoma, grade 3; ductal carcinoma in the lymph node tissue. Prognostic indicators significant for: estrogen receptor, 95% positive and progesterone receptor, 5% positive, both with strong staining intensity. Proliferation marker Ki67 at 20%. HER2 negative by immunohistochemistry, 1+.  The patient's subsequent history is as detailed below.   PAST MEDICAL HISTORY: Past Medical History:  Diagnosis Date   Abrasion of arm, left    secondary to fall    Anemia    Arthritis    Breast cancer, left (HCC)    er/pr +   Cataract    no surgery as yet,starting of 3 years   Chronic kidney disease    renal stones, one episode of Lithotripsy   Cirrhosis ([place])    Metavir score 4   Complication of  anesthesia    Diabetes mellitus    GERD (gastroesophageal reflux disease)    Hyperlipidemia    Hypertension    followed by [facility]. relative to  urology study grp.  Doesn't report ever having a stress test    Mental  disorder    Multiple falls    Nocturia    PONV (postoperative nausea and vomiting)    Seasonal allergies    Skin cancer    face- melanoma, 2012- surg. excision      PAST SURGICAL HISTORY: Past Surgical History:  Procedure Laterality Date   ABDOMINAL HYSTERECTOMY     BACK SURGERY     x4 back surgery to include rods & screws  . Last in May 2014   BREAST LUMPECTOMY WITH NEEDLE LOCALIZATION AND AXILLARY SENTINEL LYMPH NODE BX  03/12/2012   Procedure: BREAST LUMPECTOMY WITH NEEDLE LOCALIZATION AND AXILLARY SENTINEL LYMPH NODE BX;  Surgeon: Merrie Roof, MD;  Location: [facility];  Service: General;  Laterality: Left;   BREAST SURGERY     lumpectomy x2/left    CHOLECYSTECTOMY     COLONOSCOPY N/A 11/20/2013   Dr.Rourk- diverticula was found in the left colon. o/w normal rectal, colonic and terminal ileal mucosa   ESOPHAGOGASTRODUODENOSCOPY N/A 11/20/2013   Dr.Rourk- abnormal mucosa was found. diffuse snake skinning and friability of the gastric mucosa. patent pylorus. abnormal-appearing first, second and third portion of the duodenum. [name] gastric nodule at the GE junction. stomach bx= minimal chronic infammation, GE junction bx= polypoid fragments of gastric cardia type mucosa w/mod chronic inflammation and foveolar hyperplasia   EYE SURGERY     cataract surgery bilat    FRACTURE SURGERY     1975- ORIF- L ankle    GIVENS CAPSULE STUDY N/A 02/24/2014   Procedure: GIVENS CAPSULE STUDY;  Surgeon: RDaneil Dolin MD;  [facility];  Service: Endoscopy;  Laterality: N/A;   RE-EXCISION OF BREAST LUMPECTOMY  03/22/2012   Procedure: RE-EXCISION OF BREAST LUMPECTOMY;  Surgeon: PMerrie Roof MD;  Location: [facility]  Service: General;  Laterality: Left;  re-excision left breast lumpectomy cavity  ER+, PR+   ROTATOR CUFF REPAIR Bilateral    SHOULDER SURGERY     SKIN BIOPSY     TOTAL KNEE ARTHROPLASTY Right 12/18/2014   Procedure: RIGHT TOTAL KNEE  ARTHROPLASTY;  Surgeon: RSydnee Cabal MD;  Location: [facility];  Service: Orthopedics;  Laterality: Right;   TUBAL LIGATION      FAMILY HISTORY: Family History  Problem Relation Age of Onset   Stroke Mother        mini-strokes   Heart disease Father    Emphysema Father    Cancer Brother        metastatic, unsure primary   Colon cancer Neg Hx    Prostate cancer Neg Hx    Patient father was 685years old when he died from "heart trouble" and emphysema. Patient mother died from mini-stroke complications at age 83 She denies a family hx of breast or ovarian cancer, prostate or pancreatic cancers. She has 3 siblings. She had a twin brother who died at age 8035from metastatic cancer. She had 2 sisters. Her only living sibling is a sister.   GYNECOLOGIC HISTORY:  No LMP recorded. Patient has had a hysterectomy. Menarche: 85years old Age at first live birth: 85years old [place] 2 LMP s/p hysterectomy Contraceptive no HRT yes, for more than 5 years  Hysterectomy? Yes, at age  52 BSO? 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 Her husband retired on disability, but he passed away in June 01, 2015 from mini-strokes. She lives at home with 2 cats. Daughter [name], who is in her 77s, is married to [place] (who works for the [place]) and lives very close to the patient.  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  The patient's son died at age 59 from accidental overdose.  The patient attends [employer] in [place].   ADVANCED DIRECTIVES: Daughter [name] is her HCPOA.   HEALTH MAINTENANCE: Social History   Tobacco Use   Smoking status: Never Smoker   Smokeless tobacco: Never Used  Substance Use Topics   Alcohol use: No   Drug use: No     Colonoscopy: 11/20/2013, Dr. Gala Romney, chronic inflammation  PAP: s/p hysterectomy   Bone density: "had one probably 10 years  ago"   Allergies  Allergen Reactions   Bee Venom Anaphylaxis and Swelling   Hydromorphone Hcl Other (See Comments)    Agitation, Confusion   Demerol Nausea Only    Current Outpatient Medications  Medication Sig Dispense Refill   acyclovir (ZOVIRAX) 400 MG tablet Take 1 tablet (400 mg total) by mouth 2 (two) times daily. 120 tablet 3   ALPHAGAN P 0.1 % SOLN Place 1 drop into both eyes 2 (two) times daily.      amLODipine (NORVASC) 5 MG tablet Take 5 mg by mouth every morning.      aspirin EC 81 MG tablet Take 81 mg by mouth daily.     atorvastatin (LIPITOR) 10 MG tablet Take 10 mg by mouth every evening.      Bacillus Coagulans-Inulin (PROBIOTIC FORMULA PO) Take 1 capsule by mouth daily.     BD PEN NEEDLE NANO U/F 32G X 4 MM MISC      cyanocobalamin (,VITAMIN B-12,) 1000 MCG/ML injection Inject 1,000 mcg into the muscle every 30 (thirty) days.      dorzolamide (TRUSOPT) 2 % ophthalmic solution Place 1 drop into both eyes 2 (two) times daily.   4   EPIPEN 2-PAK 0.3 MG/0.3ML SOAJ injection Inject 0.3 mg into the muscle as needed for anaphylaxis.      fulvestrant (FASLODEX) 250 MG/5ML injection Inject 500 mg into the muscle every 14 (fourteen) days. One injection each buttock over 1-2 minutes. Warm prior to use.     gabapentin (NEURONTIN) 600 MG tablet Take 600 mg by mouth daily.      glipiZIDE (GLUCOTROL) 5 MG tablet Take 5 mg by mouth daily before breakfast.      guaiFENesin-dextromethorphan (ROBITUSSIN DM) 100-10 MG/5ML syrup Take 5 mLs by mouth every 4 (four) hours as needed for cough. 118 mL 0   ipratropium-albuterol (DUONEB) 0.5-2.5 (3) MG/3ML SOLN Take 3 mLs by nebulization 3 (three) times daily. 360 mL 3   Lancets (ONETOUCH DELICA PLUS [id number]) MISC      latanoprost (XALATAN) 0.005 % ophthalmic solution Place 1 drop into both eyes at bedtime.      methocarbamol (ROBAXIN) 500 MG tablet Take 500 mg by mouth 2 (two) times daily as needed for muscle spasms.      metoprolol succinate (TOPROL-XL) 100 MG 24 hr tablet Take 100 mg by mouth every morning. Take with or immediately following a meal.     mirtazapine (REMERON) 15 MG tablet Take 15 mg by mouth at bedtime.  MYRBETRIQ 50 MG TB24 tablet Take 50 mg by mouth daily.      omeprazole (PRILOSEC) 20 MG capsule Take 20 mg by mouth daily.     ONETOUCH ULTRA test strip      palbociclib (IBRANCE) 75 MG capsule Take 1 capsule (75 mg) by mouth once every other day with food. Take for 21 days on, 7 days off, repeat every 28 days. 11 capsule 6   Polyethyl Glycol-Propyl Glycol (SYSTANE ULTRA) 0.4-0.3 % SOLN Place 1-2 drops into both eyes daily as needed (for itching eyes).     predniSONE (STERAPRED UNI-PAK 21 TAB) 10 MG (21) TBPK tablet Take by package instructions (Patient not taking: Reported on 11/19/2018) 21 tablet o   tamoxifen (NOLVADEX) 20 MG tablet      torsemide (DEMADEX) 20 MG tablet Take 1 tablet (20 mg total) by mouth daily. 30 tablet 5   TRESIBA FLEXTOUCH 100 UNIT/ML SOPN FlexTouch Pen Inject 0.5 mLs (50 Units total) into the skin daily. 5 pen 5   No current facility-administered medications for this visit.     OBJECTIVE: Elderly white woman using a chair walker  Vitals:   12/23/18 1040  BP: 139/74  Pulse: 95  Resp: 18  Temp: 98.2 F (36.8 C)  SpO2: 95%   Wt Readings from Last 3 Encounters:  12/23/18 196 lb 14.4 oz (89.3 kg)  11/25/18 202 lb 11.2 oz (91.9 kg)  11/19/18 193 lb (87.5 kg)   Body mass index is 36.01 kg/m.    ECOG FS:2 - Symptomatic, <50% confined to bed  Sclerae unicteric, EOMs intact Wearing a mask No cervical or supraclavicular adenopathy Lungs no rales or rhonchi Heart regular rate and rhythm Abd soft, nontender, positive bowel sounds MSK no focal spinal tenderness, no upper extremity lymphedema Neuro: nonfocal, well oriented, appropriate affect Breasts: The right breast is unremarkable.  The left breast has the skin nodules which have been imaged  repeatedly before.  They do not seem to be significantly changed.  Importantly, the lesions go right to and just beyond the midline inferiorly.  I do not palpate any axillary adenopathy.   Left breast 11/25/2018    Left Breast 10/08/2018     Left breast 08/13/2018     Left breast 04/03/2018     LAB RESULTS:  CMP     Component Value Date/Time   NA 134 (L) 11/25/2018 1350   K 4.1 11/25/2018 1350   CL 95 (L) 11/25/2018 1350   CO2 29 11/25/2018 1350   GLUCOSE 490 (H) 11/25/2018 1350   BUN 22 11/25/2018 1350   CREATININE 2.49 (H) 11/25/2018 1350   CREATININE 2.18 (H) 10/08/2018 1146   CALCIUM 8.8 (L) 11/25/2018 1350   PROT 6.5 11/25/2018 1350   ALBUMIN 3.4 (L) 11/25/2018 1350   AST 24 11/25/2018 1350   AST 27 10/08/2018 1146   ALT 16 11/25/2018 1350   ALT 15 10/08/2018 1146   ALKPHOS 109 11/25/2018 1350   BILITOT 0.7 11/25/2018 1350   BILITOT 1.0 10/08/2018 1146   GFRNONAA 17 (L) 11/25/2018 1350   GFRNONAA 20 (L) 10/08/2018 1146   GFRAA 20 (L) 11/25/2018 1350   GFRAA 24 (L) 10/08/2018 1146    No results found for: TOTALPROTELP, ALBUMINELP, A1GS, A2GS, BETS, BETA2SER, GAMS, MSPIKE, SPEI  No results found for: KPAFRELGTCHN, LAMBDASER, KAPLAMBRATIO  Lab Results  Component Value Date   WBC 4.8 12/23/2018   NEUTROABS 2.8 12/23/2018   HGB 12.1 12/23/2018   HCT 35.9 (L) 12/23/2018   MCV  115.1 (H) 12/23/2018   PLT 144 (L) 12/23/2018    '@LASTCHEMISTRY'$ @  No results found for: LABCA2  No components found for: ONGEXB284  No results for input(s): INR in the last 168 hours.  No results found for: LABCA2  No results found for: XLK440  No results found for: NUU725  No results found for: DGU440  Lab Results  Component Value Date   CA2729 25.6 04/26/2018    No components found for: HGQUANT  No results found for: CEA1 / No results found for: CEA1   No results found for: AFPTUMOR  No results found for: [place]  No results found for:  PSA1  Appointment on 12/23/2018  Component Date Value Ref Range Status   WBC 12/23/2018 4.8  4.0 - 10.5 K/uL Final   RBC 12/23/2018 3.12* 3.87 - 5.11 MIL/uL Final   Hemoglobin 12/23/2018 12.1  12.0 - 15.0 g/dL Final   HCT 12/23/2018 35.9* 36.0 - 46.0 % Final   MCV 12/23/2018 115.1* 80.0 - 100.0 fL Final   MCH 12/23/2018 38.8* 26.0 - 34.0 pg Final   MCHC 12/23/2018 33.7  30.0 - 36.0 g/dL Final   RDW 12/23/2018 14.3  11.5 - 15.5 % Final   Platelets 12/23/2018 144* 150 - 400 K/uL Final   nRBC 12/23/2018 0.4* 0.0 - 0.2 % Final   Neutrophils Relative % 12/23/2018 56  % Final   Neutro Abs 12/23/2018 2.8  1.7 - 7.7 K/uL Final   Lymphocytes Relative 12/23/2018 29  % Final   Lymphs Abs 12/23/2018 1.4  0.7 - 4.0 K/uL Final   Monocytes Relative 12/23/2018 9  % Final   Monocytes Absolute 12/23/2018 0.4  0.1 - 1.0 K/uL Final   Eosinophils Relative 12/23/2018 2  % Final   Eosinophils Absolute 12/23/2018 0.1  0.0 - 0.5 K/uL Final   Basophils Relative 12/23/2018 3  % Final   Basophils Absolute 12/23/2018 0.1  0.0 - 0.1 K/uL Final   Immature Granulocytes 12/23/2018 1  % Final   Abs Immature Granulocytes 12/23/2018 0.03  0.00 - 0.07 K/uL Final   Performed at [facility], [address] [place], [place] [address]    (this displays the last labs from the last 3 days)  No results found for: TOTALPROTELP, ALBUMINELP, A1GS, A2GS, BETS, BETA2SER, GAMS, MSPIKE, SPEI (this displays SPEP labs)  No results found for: KPAFRELGTCHN, LAMBDASER, KAPLAMBRATIO (kappa/lambda light chains)  No results found for: HGBA, HGBA2QUANT, HGBFQUANT, HGBSQUAN (Hemoglobinopathy evaluation)   No results found for: LDH  No results found for: IRON, TIBC, IRONPCTSAT (Iron and TIBC)  No results found for: FERRITIN  Urinalysis    Component Value Date/Time   COLORURINE YELLOW 11/19/2018 [place] 11/19/2018 1455   LABSPEC 1.009 11/19/2018 1455   PHURINE  6.0 11/19/2018 1455   GLUCOSEU >=500 (A) 11/19/2018 1455   HGBUR NEGATIVE 11/19/2018 1455   BILIRUBINUR NEGATIVE 11/19/2018 1455   KETONESUR NEGATIVE 11/19/2018 1455   PROTEINUR NEGATIVE 11/19/2018 1455   UROBILINOGEN 1.0 12/11/2014 1106   NITRITE NEGATIVE 11/19/2018 1455   LEUKOCYTESUR SMALL (A) 11/19/2018 1455     STUDIES: No results found.   ELIGIBLE FOR AVAILABLE RESEARCH PROTOCOL: no   ASSESSMENT: 85 y.o. [place] woman status post left lumpectomy and sentinel lymph node sampling 03/12/2012 for a  pT1b pN0, stage IA invasive ductal carcinoma, grade 3, strongly estrogen and progesterone receptor negative, HER-2 nonamplified, with an MIB-1 of 15%  (a) additional surgery for margin clearance was successful 03/22/2012  (  1) the patient opted against adjuvant radiation  (2) anastrozole started March 2014, discontinued 04/03/2018 with evidence of recurrence  RECURRENT DISEASE: February 2020 (3) status post left breast retroareolar biopsy 03/05/2018 for a clinical T1c N1, stage IIA invasive ductal carcinoma, estrogen and progesterone receptor positive, HER-2 not amplified, with an MIB-1 of 20%.  (4) spread to skin/chest wall on left noted 04/03/2018  (a) chest CT 04/11/2018 scan showed nonspecific bilateral axillary adenopathy   (b) bone scan 04/11/2018 shows only degenerative disease  (c) CA 27-29 not informative (was 25.6 on 04/26/2018)  (d) biopsy of 2 left breast skin nodules 04/04/2018 shows invasive carcinoma, estrogen receptor 100% positive, progesterone receptor and HER-2 negative, with an MIB-1 of 30%.  (5) started fulvestrant 04/12/2018  (a) added palbociclib 04/26/2018 at 125 mg p.o. daily, 21 days on, 7 days off   (1) dose decreased to 75 mg daily, 21/03 September 2018   (2) dose decreased to 75 mg every other day, 21/7 with October 2020 cycle  (b) tamoxifen added and fulvestrant changed to every 8 weeks after 08/13/2018 dose to minimize exposure during the pandemic,  discontinued when monthly fulvestrant resumed  (c) fulvestrant resumed at every 4 weeks beginning with 11/25/2018 dose  (6) CT of the head, chest, abdomen, and pelvis August/September 2020 shows no evidence of metastatic disease outside of the left breast  PLAN: Leean is now about 8 months out from definitive diagnosis of chest wall recurrence.  She is tolerating her treatment well.  Because we changed her treatment to tamoxifen briefly because of the pandemic, I am not sure whether we are just barely maintaining or actually having a slight response.  I would like to give it a couple of more months before making a definitive decision and we discussed this at length today.  Accordingly she will be treated with fulvestrant today, 28 days from now when she will see my 63 assistant, and again she will see me in December.  At the December visit we will make a decision regarding whether or not to continue what we are doing or switch her to capecitabine.  She will turn 66 next month  She knows to call for any other issue that may develop before the next visit here.   Mccoy Testa, Virgie Dad, MD  12/23/18 10:56 AM Medical Oncology and Hematology [facility] [place], [place] [address] Tel. [phone]    Fax. [phone]   I, Wilburn Mylar, am acting as scribe for Dr. Virgie Dad. Darrly Loberg.  I, Lurline Del MD, have reviewed the above documentation for accuracy and completeness, and I agree with the above.

## 2018-12-23 ENCOUNTER — Inpatient Hospital Stay: Payer: Medicare HMO | Admitting: Oncology

## 2018-12-23 ENCOUNTER — Inpatient Hospital Stay: Payer: Medicare HMO | Attending: Oncology

## 2018-12-23 ENCOUNTER — Other Ambulatory Visit: Payer: Self-pay

## 2018-12-23 ENCOUNTER — Inpatient Hospital Stay: Payer: Medicare HMO

## 2018-12-23 VITALS — BP 139/74 | HR 95 | Temp 98.2°F | Resp 18 | Ht 62.0 in | Wt 196.9 lb

## 2018-12-23 DIAGNOSIS — Z79811 Long term (current) use of aromatase inhibitors: Secondary | ICD-10-CM | POA: Insufficient documentation

## 2018-12-23 DIAGNOSIS — Z7982 Long term (current) use of aspirin: Secondary | ICD-10-CM | POA: Insufficient documentation

## 2018-12-23 DIAGNOSIS — Z79899 Other long term (current) drug therapy: Secondary | ICD-10-CM | POA: Insufficient documentation

## 2018-12-23 DIAGNOSIS — E119 Type 2 diabetes mellitus without complications: Secondary | ICD-10-CM | POA: Insufficient documentation

## 2018-12-23 DIAGNOSIS — E1165 Type 2 diabetes mellitus with hyperglycemia: Secondary | ICD-10-CM

## 2018-12-23 DIAGNOSIS — Z794 Long term (current) use of insulin: Secondary | ICD-10-CM | POA: Diagnosis not present

## 2018-12-23 DIAGNOSIS — Z85820 Personal history of malignant melanoma of skin: Secondary | ICD-10-CM | POA: Diagnosis not present

## 2018-12-23 DIAGNOSIS — C50812 Malignant neoplasm of overlapping sites of left female breast: Secondary | ICD-10-CM | POA: Diagnosis not present

## 2018-12-23 DIAGNOSIS — E785 Hyperlipidemia, unspecified: Secondary | ICD-10-CM | POA: Diagnosis not present

## 2018-12-23 DIAGNOSIS — C7989 Secondary malignant neoplasm of other specified sites: Secondary | ICD-10-CM | POA: Insufficient documentation

## 2018-12-23 DIAGNOSIS — E1140 Type 2 diabetes mellitus with diabetic neuropathy, unspecified: Secondary | ICD-10-CM

## 2018-12-23 DIAGNOSIS — Z170 Estrogen receptor positive status [ER+]: Secondary | ICD-10-CM | POA: Diagnosis not present

## 2018-12-23 DIAGNOSIS — C50312 Malignant neoplasm of lower-inner quadrant of left female breast: Secondary | ICD-10-CM | POA: Diagnosis not present

## 2018-12-23 DIAGNOSIS — N189 Chronic kidney disease, unspecified: Secondary | ICD-10-CM | POA: Diagnosis not present

## 2018-12-23 DIAGNOSIS — D518 Other vitamin B12 deficiency anemias: Secondary | ICD-10-CM

## 2018-12-23 DIAGNOSIS — I10 Essential (primary) hypertension: Secondary | ICD-10-CM | POA: Insufficient documentation

## 2018-12-23 DIAGNOSIS — C50912 Malignant neoplasm of unspecified site of left female breast: Secondary | ICD-10-CM | POA: Diagnosis not present

## 2018-12-23 DIAGNOSIS — C50012 Malignant neoplasm of nipple and areola, left female breast: Secondary | ICD-10-CM | POA: Diagnosis present

## 2018-12-23 DIAGNOSIS — IMO0002 Reserved for concepts with insufficient information to code with codable children: Secondary | ICD-10-CM

## 2018-12-23 LAB — COMPREHENSIVE METABOLIC PANEL
ALT: 14 U/L (ref 0–44)
AST: 21 U/L (ref 15–41)
Albumin: 3.1 g/dL — ABNORMAL LOW (ref 3.5–5.0)
Alkaline Phosphatase: 104 U/L (ref 38–126)
Anion gap: 15 (ref 5–15)
BUN: 34 mg/dL — ABNORMAL HIGH (ref 8–23)
CO2: 23 mmol/L (ref 22–32)
Calcium: 8.3 mg/dL — ABNORMAL LOW (ref 8.9–10.3)
Chloride: 103 mmol/L (ref 98–111)
Creatinine, Ser: 2.47 mg/dL — ABNORMAL HIGH (ref 0.44–1.00)
GFR calc Af Amer: 20 mL/min — ABNORMAL LOW (ref >60)
GFR calc non Af Amer: 18 mL/min — ABNORMAL LOW (ref >60)
Glucose, Bld: 410 mg/dL — ABNORMAL HIGH (ref 70–99)
Potassium: 3.7 mmol/L (ref 3.5–5.1)
Sodium: 141 mmol/L (ref 135–145)
Total Bilirubin: 0.9 mg/dL (ref 0.3–1.2)
Total Protein: 6.4 g/dL — ABNORMAL LOW (ref 6.5–8.1)

## 2018-12-23 LAB — CBC WITH DIFFERENTIAL/PLATELET
Abs Immature Granulocytes: 0.03 10*3/uL (ref 0.00–0.07)
Basophils Absolute: 0.1 10*3/uL (ref 0.0–0.1)
Basophils Relative: 3 %
Eosinophils Absolute: 0.1 10*3/uL (ref 0.0–0.5)
Eosinophils Relative: 2 %
HCT: 35.9 % — ABNORMAL LOW (ref 36.0–46.0)
Hemoglobin: 12.1 g/dL (ref 12.0–15.0)
Immature Granulocytes: 1 %
Lymphocytes Relative: 29 %
Lymphs Abs: 1.4 10*3/uL (ref 0.7–4.0)
MCH: 38.8 pg — ABNORMAL HIGH (ref 26.0–34.0)
MCHC: 33.7 g/dL (ref 30.0–36.0)
MCV: 115.1 fL — ABNORMAL HIGH (ref 80.0–100.0)
Monocytes Absolute: 0.4 10*3/uL (ref 0.1–1.0)
Monocytes Relative: 9 %
Neutro Abs: 2.8 10*3/uL (ref 1.7–7.7)
Neutrophils Relative %: 56 %
Platelets: 144 10*3/uL — ABNORMAL LOW (ref 150–400)
RBC: 3.12 MIL/uL — ABNORMAL LOW (ref 3.87–5.11)
RDW: 14.3 % (ref 11.5–15.5)
WBC: 4.8 10*3/uL (ref 4.0–10.5)
nRBC: 0.4 % — ABNORMAL HIGH (ref 0.0–0.2)

## 2018-12-23 MED ORDER — FULVESTRANT 250 MG/5ML IM SOLN
INTRAMUSCULAR | Status: AC
  Filled 2018-12-23: qty 5

## 2018-12-23 MED ORDER — FULVESTRANT 250 MG/5ML IM SOLN
500.0000 mg | Freq: Once | INTRAMUSCULAR | Status: AC
  Administered 2018-12-23: 12:00:00 500 mg via INTRAMUSCULAR

## 2018-12-23 NOTE — Patient Instructions (Signed)
Fulvestrant injection What is this medicine? FULVESTRANT (ful VES trant) blocks the effects of estrogen. It is used to treat breast cancer. This medicine may be used for other purposes; ask your health care provider or pharmacist if you have questions. COMMON BRAND NAME(S): FASLODEX What should I tell my health care provider before I take this medicine? They need to know if you have any of these conditions:  bleeding disorders  liver disease  low blood counts, like low white cell, platelet, or red cell counts  an unusual or allergic reaction to fulvestrant, other medicines, foods, dyes, or preservatives  pregnant or trying to get pregnant  breast-feeding How should I use this medicine? This medicine is for injection into a muscle. It is usually given by a health care professional in a hospital or clinic setting. Talk to your pediatrician regarding the use of this medicine in children. Special care may be needed. Overdosage: If you think you have taken too much of this medicine contact a poison control center or emergency room at once. NOTE: This medicine is only for you. Do not share this medicine with others. What if I miss a dose? It is important not to miss your dose. Call your doctor or health care professional if you are unable to keep an appointment. What may interact with this medicine?  medicines that treat or prevent blood clots like warfarin, enoxaparin, dalteparin, apixaban, dabigatran, and rivaroxaban This list may not describe all possible interactions. Give your health care provider a list of all the medicines, herbs, non-prescription drugs, or dietary supplements you use. Also tell them if you smoke, drink alcohol, or use illegal drugs. Some items may interact with your medicine. What should I watch for while using this medicine? Your condition will be monitored carefully while you are receiving this medicine. You will need important blood work done while you are taking  this medicine. Do not become pregnant while taking this medicine or for at least 1 year after stopping it. Women of child-bearing potential will need to have a negative pregnancy test before starting this medicine. Women should inform their doctor if they wish to become pregnant or think they might be pregnant. There is a potential for serious side effects to an unborn child. Men should inform their doctors if they wish to father a child. This medicine may lower sperm counts. Talk to your health care professional or pharmacist for more information. Do not breast-feed an infant while taking this medicine or for 1 year after the last dose. What side effects may I notice from receiving this medicine? Side effects that you should report to your doctor or health care professional as soon as possible:  allergic reactions like skin rash, itching or hives, swelling of the face, lips, or tongue  feeling faint or lightheaded, falls  pain, tingling, numbness, or weakness in the legs  signs and symptoms of infection like fever or chills; cough; flu-like symptoms; sore throat  vaginal bleeding Side effects that usually do not require medical attention (report to your doctor or health care professional if they continue or are bothersome):  aches, pains  constipation  diarrhea  headache  hot flashes  nausea, vomiting  pain at site where injected  stomach pain This list may not describe all possible side effects. Call your doctor for medical advice about side effects. You may report side effects to FDA at 1-800-FDA-1088. Where should I keep my medicine? This drug is given in a hospital or clinic and will   not be stored at home. NOTE: This sheet is a summary. It may not cover all possible information. If you have questions about this medicine, talk to your doctor, pharmacist, or health care provider.  2020 Elsevier/Gold Standard (2017-05-24 11:34:41)  

## 2018-12-24 ENCOUNTER — Telehealth: Payer: Self-pay | Admitting: Oncology

## 2018-12-24 NOTE — Telephone Encounter (Signed)
I talk with patient regarding reschedule °

## 2018-12-26 DIAGNOSIS — J9601 Acute respiratory failure with hypoxia: Secondary | ICD-10-CM | POA: Diagnosis not present

## 2018-12-26 DIAGNOSIS — E1165 Type 2 diabetes mellitus with hyperglycemia: Secondary | ICD-10-CM | POA: Diagnosis not present

## 2018-12-26 DIAGNOSIS — E1122 Type 2 diabetes mellitus with diabetic chronic kidney disease: Secondary | ICD-10-CM | POA: Diagnosis not present

## 2018-12-26 DIAGNOSIS — C50012 Malignant neoplasm of nipple and areola, left female breast: Secondary | ICD-10-CM | POA: Diagnosis not present

## 2018-12-26 DIAGNOSIS — N184 Chronic kidney disease, stage 4 (severe): Secondary | ICD-10-CM | POA: Diagnosis not present

## 2018-12-26 DIAGNOSIS — R531 Weakness: Secondary | ICD-10-CM | POA: Diagnosis not present

## 2018-12-26 DIAGNOSIS — G9341 Metabolic encephalopathy: Secondary | ICD-10-CM | POA: Diagnosis not present

## 2018-12-26 DIAGNOSIS — J189 Pneumonia, unspecified organism: Secondary | ICD-10-CM | POA: Diagnosis not present

## 2018-12-26 DIAGNOSIS — I129 Hypertensive chronic kidney disease with stage 1 through stage 4 chronic kidney disease, or unspecified chronic kidney disease: Secondary | ICD-10-CM | POA: Diagnosis not present

## 2018-12-26 DIAGNOSIS — D631 Anemia in chronic kidney disease: Secondary | ICD-10-CM | POA: Diagnosis not present

## 2018-12-26 NOTE — Progress Notes (Signed)
[name] was seen today in neurologic consultation at the request of Sinda Du, MD.  The consultation is for the evaluation of "myoclonic jerks."  Medical records that are available to me are reviewed, including the patient's emergency room evaluation for myoclonus.  Daughter supplements the history.    Patient was in the emergency room on October 26, 2018 after having "jerking movements."  Patient was found in the emergency room to be hypoxic with fever and was ultimately admitted to the hospital for acute respiratory failure with hypoxia.  She was there until September 3.  She was back in the emergency room on November 19, 2018 with "intermittent jerking of the lower extremities."  Daughter states that the jerking was "all over."  Emergency room physician notes indicate that there was occasional myoclonic jerking of the bilateral lower extremities.  She was treated with Ativan in the emergency room, which resolved the symptoms.  She was given clonazepam for at home treatment.  She was discharged from the emergency room.  She followed up with her primary care.  I have reviewed his records.  He changed her from clonazepam to lorazepam.  She is on 0.5 mg bid.  She states that it doesn't make her sleepy but daughter states that she dozes anyway.  It is helping.  She has had some jerking today.  Daughter states that pt moved in with her in feb, 2020.  Even last fall, daughter noted jerking spells but back then, they would not prevent her from walking.    On gabapentin for 3-4 years.  Takes 600 mg gabapentin 1-2 times per day (states can take 3-4 per day but doesn't).  She took an extra gabapentin yesterday and noticed some extra jerking today.  ALLERGIES:   Allergies  Allergen Reactions  . Bee Venom Anaphylaxis and Swelling  . Hydromorphone Hcl Other (See Comments)    Agitation, Confusion  . Demerol Nausea Only    CURRENT MEDICATIONS:  Outpatient Encounter Medications as of 12/30/2018   Medication Sig  . ALPHAGAN P 0.1 % SOLN Place 1 drop into both eyes 2 (two) times daily.   [name] Kitchen amLODipine (NORVASC) 5 MG tablet Take 5 mg by mouth every morning.   [name] Kitchen aspirin EC 81 MG tablet Take 81 mg by mouth daily.  [name] Kitchen atorvastatin (LIPITOR) 10 MG tablet Take 10 mg by mouth every evening.   . Bacillus Coagulans-Inulin (PROBIOTIC FORMULA PO) Take 1 capsule by mouth daily.  . BD PEN NEEDLE NANO U/F 32G X 4 MM MISC   . cyanocobalamin (,VITAMIN B-12,) 1000 MCG/ML injection Inject 1,000 mcg into the muscle every 30 (thirty) days.   . dorzolamide (TRUSOPT) 2 % ophthalmic solution Place 1 drop into both eyes 2 (two) times daily.   [name] Kitchen EPIPEN 2-PAK 0.3 MG/0.3ML SOAJ injection Inject 0.3 mg into the muscle as needed for anaphylaxis.   . fulvestrant (FASLODEX) 250 MG/5ML injection Inject 10 mLs (500 mg total) into the muscle every 28 (twenty-eight) days. One injection each buttock over 1-2 minutes. Warm prior to use.  . gabapentin (NEURONTIN) 600 MG tablet Take 600 mg by mouth daily.   [name] Kitchen glipiZIDE (GLUCOTROL) 5 MG tablet Take 5 mg by mouth daily before breakfast.   . ipratropium-albuterol (DUONEB) 0.5-2.5 (3) MG/3ML SOLN Take 3 mLs by nebulization 3 (three) times daily.  . Lancets (ONETOUCH DELICA PLUS [id number]) MISC   . latanoprost (XALATAN) 0.005 % ophthalmic solution Place 1 drop into both eyes at bedtime.   [name] Kitchen LORazepam (ATIVAN)  0.5 MG tablet Take 0.5 mg by mouth 2 (two) times daily.  . metoprolol succinate (TOPROL-XL) 100 MG 24 hr tablet Take 100 mg by mouth every morning. Take with or immediately following a meal.  . mirtazapine (REMERON) 15 MG tablet Take 15 mg by mouth at bedtime.   [name] Kitchen MYRBETRIQ 50 MG TB24 tablet Take 50 mg by mouth daily.   [name] Kitchen omeprazole (PRILOSEC) 20 MG capsule Take 20 mg by mouth daily.  Glory Rosebush ULTRA test strip   . palbociclib (IBRANCE) 75 MG capsule Take 1 capsule (75 mg) by mouth once every other day with food. Take for 21 days on, 7 days off, repeat every 28 days.  Vladimir Faster Glycol-Propyl Glycol (SYSTANE ULTRA) 0.4-0.3 % SOLN Place 1-2 drops into both eyes daily as needed (for itching eyes).  . torsemide (DEMADEX) 20 MG tablet Take 1 tablet (20 mg total) by mouth daily.  . TRESIBA FLEXTOUCH 100 UNIT/ML SOPN FlexTouch Pen Inject 0.5 mLs (50 Units total) into the skin daily.  . [DISCONTINUED] acyclovir (ZOVIRAX) 400 MG tablet Take 1 tablet (400 mg total) by mouth 2 (two) times daily.  . [DISCONTINUED] guaiFENesin-dextromethorphan (ROBITUSSIN DM) 100-10 MG/5ML syrup Take 5 mLs by mouth every 4 (four) hours as needed for cough. (Patient not taking: Reported on 12/30/2018)  . [DISCONTINUED] methocarbamol (ROBAXIN) 500 MG tablet Take 500 mg by mouth 2 (two) times daily as needed for muscle spasms.  . [DISCONTINUED] predniSONE (STERAPRED UNI-PAK 21 TAB) 10 MG (21) TBPK tablet Take by package instructions (Patient not taking: Reported on 11/19/2018)   No facility-administered encounter medications on file as of 12/30/2018.     PAST MEDICAL HISTORY:   Past Medical History:  Diagnosis Date  . Abrasion of arm, left    secondary to fall   . Anemia   . Arthritis   . Breast cancer, left ([place])    er/pr +  . Cataract    no surgery as yet,starting of 3 years  . Chronic kidney disease    renal stones, one episode of Lithotripsy  . Cirrhosis ([facility])    Metavir score 4  . Complication of anesthesia   . Diabetes mellitus   . GERD (gastroesophageal reflux disease)   . Hyperlipidemia   . Hypertension    followed by [facility]. relative to  urology study grp.  Doesn't report ever having a stress test   . Mental disorder   . Multiple falls   . Nocturia   . PONV (postoperative nausea and vomiting)   . Seasonal allergies   . Skin cancer    face- melanoma, 2012- surg. excision      PAST SURGICAL HISTORY:   Past Surgical History:  Procedure Laterality Date  . ABDOMINAL HYSTERECTOMY    . BACK SURGERY     x4 back surgery to include rods & screws  . Last in May 2014   . BREAST LUMPECTOMY WITH NEEDLE LOCALIZATION AND AXILLARY SENTINEL LYMPH NODE BX  03/12/2012   Procedure: BREAST LUMPECTOMY WITH NEEDLE LOCALIZATION AND AXILLARY SENTINEL LYMPH NODE BX;  Surgeon: Merrie Roof, MD;  Location: [facility];  Service: General;  Laterality: Left;  . BREAST SURGERY     lumpectomy x2/left   . CHOLECYSTECTOMY    . COLONOSCOPY N/A 11/20/2013   Dr.Rourk- diverticula was found in the left colon. o/w normal rectal, colonic and terminal ileal mucosa  . ESOPHAGOGASTRODUODENOSCOPY N/A 11/20/2013   Dr.Rourk- abnormal mucosa was found. diffuse snake skinning and friability of the gastric  mucosa. patent pylorus. abnormal-appearing first, second and third portion of the duodenum. 33mm gastric nodule at the GE junction. stomach bx= minimal chronic infammation, GE junction bx= polypoid fragments of gastric cardia type mucosa w/mod chronic inflammation and foveolar hyperplasia  . EYE SURGERY     cataract surgery bilat   . FRACTURE SURGERY     1975- [facility]- L ankle   . GIVENS CAPSULE STUDY N/A 02/24/2014   Procedure: GIVENS CAPSULE STUDY;  Surgeon: Daneil Dolin, MD;  Location: [facility];  Service: Endoscopy;  Laterality: N/A;  . RE-EXCISION OF BREAST LUMPECTOMY  03/22/2012   Procedure: RE-EXCISION OF BREAST LUMPECTOMY;  Surgeon: Merrie Roof, MD;  Location: [facility];  Service: General;  Laterality: Left;  re-excision left breast lumpectomy cavity  ER+, PR+  . ROTATOR CUFF REPAIR Bilateral   . SHOULDER SURGERY    . SKIN BIOPSY    . TOTAL KNEE ARTHROPLASTY Right 12/18/2014   Procedure: RIGHT TOTAL KNEE ARTHROPLASTY;  Surgeon: Sydnee Cabal, MD;  Location: [facility];  Service: Orthopedics;  Laterality: Right;  . TUBAL LIGATION      SOCIAL HISTORY:   Social History   Socioeconomic History  . Marital status: Widowed    Spouse name: Not on file  . Number of children: Not on file  . Years of education: Not on file  . Highest education level: Not on file   Occupational History  . Occupation: retired    Fish farm manager: RETIRED    Comment: Orthoptist for 30 years  Social Needs  . Financial resource strain: Not on file  . Food insecurity    Worry: Not on file    Inability: Not on file  . Transportation needs    Medical: Not on file    Non-medical: Not on file  Tobacco Use  . Smoking status: Never Smoker  . Smokeless tobacco: Never Used  Substance and Sexual Activity  . Alcohol use: No  . Drug use: No  . Sexual activity: Not Currently    Comment: GX p2,  1st live birth 20  Lifestyle  . Physical activity    Days per week: Not on file    Minutes per session: Not on file  . Stress: Not on file  Relationships  . Social Herbalist on phone: Not on file    Gets together: Not on file    Attends religious service: Not on file    Active member of club or organization: Not on file    Attends meetings of clubs or organizations: Not on file    Relationship status: Not on file  . Intimate partner violence    Fear of current or ex partner: Not on file    Emotionally abused: Not on file    Physically abused: Not on file    Forced sexual activity: Not on file  Other Topics Concern  . Not on file  Social History Narrative  . Not on file    FAMILY HISTORY:   Family Status  Relation Name Status  . Mother  Deceased at age 63  . Father  Deceased at age 33  . Sister  Alive  . Brother  Deceased at age 22  . Sister  Deceased  . Son accidental morphine over Deceased at age 68       opioid overdose  . Daughter [employer]  . Brother  Deceased  . Neg Hx  (Not Specified)    ROS:  Review  of Systems  Constitutional: Negative.   HENT: Negative.   Eyes: Negative.   Respiratory: Positive for shortness of breath (DOE).   Cardiovascular: Negative.   Gastrointestinal: Positive for constipation.  Genitourinary: Negative.   Musculoskeletal: Positive for back pain and joint pain (L ankle).  Skin: Negative.    Psychiatric/Behavioral: Negative.     PHYSICAL EXAMINATION:    VITALS:   Vitals:   12/30/18 1008  BP: 117/65  Pulse: 78  Resp: 18  Temp: 98.4 F (36.9 C)  SpO2: 94%  Weight: 196 lb 12.8 oz (89.3 kg)  Height: 5' (1.524 m)    GEN:  Normal appears female in no acute distress.  Appears stated age. HEENT:  Normocephalic, atraumatic. The mucous membranes are moist. The superficial temporal arteries are without ropiness or tenderness. Cardiovascular: Regular rate and rhythm. Lungs: Clear to auscultation bilaterally. Neck/Heme: There are no carotid bruits noted bilaterally.  NEUROLOGICAL: Orientation:  The patient is alert and oriented x 3.  Fund of knowledge is appropriate.  Recent and remote memory intact.  Attention span and concentration normal.  Repeats and names without difficulty. Cranial nerves: There is good facial symmetry. The pupils are equal round and reactive to light bilaterally. Fundoscopic exam reveals clear disc margins bilaterally. Extraocular muscles are intact and visual fields are full to confrontational testing. Speech is fluent and clear. Soft palate rises symmetrically and there is no tongue deviation. Hearing is intact to conversational tone. Tone: Tone is good throughout. Sensation: Sensation is intact to light touch and pinprick throughout (facial, trunk, extremities). Vibration is intact at the bilateral big toe. There is no extinction with double simultaneous stimulation. There is no sensory dermatomal level identified. Coordination:  The patient has no difficulty with RAM's or FNF bilaterally. Motor: Strength is 5/5 in the bilateral upper and lower extremities.  Shoulder shrug is equal and symmetric. There is no pronator drift.  There are no fasciculations noted. DTR's: Deep tendon reflexes are 2/4 at the bilateral biceps, triceps, brachioradialis, patella and achilles.  Plantar responses are downgoing bilaterally. Gait and Station: The patient is able to  ambulate without difficulty. The patient is able to heel toe walk without any difficulty. The patient is able to ambulate in a tandem fashion. The patient is able to stand in the Romberg position. Abnormal movements: She had very mild myoclonus in the upper extremities when the arms were held antigravity.  She did not have this when she was walking.  There is no asterixis.    Chemistry      Component Value Date/Time   NA 141 12/23/2018 1022   K 3.7 12/23/2018 1022   CL 103 12/23/2018 1022   CO2 23 12/23/2018 1022   BUN 34 (H) 12/23/2018 1022   CREATININE 2.47 (H) 12/23/2018 1022   CREATININE 2.18 (H) 10/08/2018 1146      Component Value Date/Time   CALCIUM 8.3 (L) 12/23/2018 1022   ALKPHOS 104 12/23/2018 1022   AST 21 12/23/2018 1022   AST 27 10/08/2018 1146   ALT 14 12/23/2018 1022   ALT 15 10/08/2018 1146   BILITOT 0.9 12/23/2018 1022   BILITOT 1.0 10/08/2018 1146      Reviewed trending creatinine with the patient and her daughter.  On October 26, her creatinine was 2.47.  On September 22 it was 2.33.  On September 2 it was 1.62.  IMPRESSION/PLAN  1. Myoclonus  -She had very mild myoclonus today.  I suspect that this is likely due to gabapentin, in the  face of renal insufficiency that has gotten worse over the last 2 months.  Explained to her that gabapentin is a very common cause of myoclonus.  Even without the gabapentin, renal insufficiency alone can cause myoclonus, but in the face of renal insufficiency, gabapentin is much more likely to cause myoclonus.  I would recommend that her gabapentin be cut from 600 mg to 300 mg and see if that alone takes care of the myoclonus.  If not, I would recommend her gabapentin be discontinued altogether.  It is possible that the myoclonus persists, then the myoclonus is likely due to renal insufficiency alone.  She is going to follow-up with her primary care physician to discuss these recommendations.  She and her daughter asked several  questions and I answered them to the best of my ability.  There was 30 min of record review which was detailed above, which was non face to face time.    Cc:  Sinda Du, MD

## 2018-12-27 DIAGNOSIS — N184 Chronic kidney disease, stage 4 (severe): Secondary | ICD-10-CM | POA: Diagnosis not present

## 2018-12-27 DIAGNOSIS — J9601 Acute respiratory failure with hypoxia: Secondary | ICD-10-CM | POA: Diagnosis not present

## 2018-12-27 DIAGNOSIS — J189 Pneumonia, unspecified organism: Secondary | ICD-10-CM | POA: Diagnosis not present

## 2018-12-27 DIAGNOSIS — C50012 Malignant neoplasm of nipple and areola, left female breast: Secondary | ICD-10-CM | POA: Diagnosis not present

## 2018-12-27 DIAGNOSIS — E1165 Type 2 diabetes mellitus with hyperglycemia: Secondary | ICD-10-CM | POA: Diagnosis not present

## 2018-12-27 DIAGNOSIS — G9341 Metabolic encephalopathy: Secondary | ICD-10-CM | POA: Diagnosis not present

## 2018-12-27 DIAGNOSIS — E1122 Type 2 diabetes mellitus with diabetic chronic kidney disease: Secondary | ICD-10-CM | POA: Diagnosis not present

## 2018-12-27 DIAGNOSIS — R531 Weakness: Secondary | ICD-10-CM | POA: Diagnosis not present

## 2018-12-27 DIAGNOSIS — I129 Hypertensive chronic kidney disease with stage 1 through stage 4 chronic kidney disease, or unspecified chronic kidney disease: Secondary | ICD-10-CM | POA: Diagnosis not present

## 2018-12-27 DIAGNOSIS — D631 Anemia in chronic kidney disease: Secondary | ICD-10-CM | POA: Diagnosis not present

## 2018-12-30 ENCOUNTER — Ambulatory Visit: Payer: Medicare HMO | Admitting: Neurology

## 2018-12-30 ENCOUNTER — Encounter: Payer: Self-pay | Admitting: Neurology

## 2018-12-30 ENCOUNTER — Other Ambulatory Visit: Payer: Self-pay

## 2018-12-30 VITALS — BP 117/65 | HR 78 | Temp 98.4°F | Resp 18 | Ht 60.0 in | Wt 196.8 lb

## 2018-12-30 DIAGNOSIS — T887XXA Unspecified adverse effect of drug or medicament, initial encounter: Secondary | ICD-10-CM

## 2018-12-30 DIAGNOSIS — N289 Disorder of kidney and ureter, unspecified: Secondary | ICD-10-CM | POA: Diagnosis not present

## 2018-12-30 DIAGNOSIS — G253 Myoclonus: Secondary | ICD-10-CM

## 2018-12-30 MED FILL — IBRANCE 75 MG CAPSULE: 75 | 28 days supply | Qty: 11 | Fill #1

## 2018-12-30 NOTE — Patient Instructions (Signed)
You likely have myoclonus (jerking) from the gabapentin and the renal insufficiency (kidney disease).  You should follow up with Dr. Luan Pulling about possibly reducing your gabapentin dosage.  Do be aware that patients can have myoclonus from kidney disease alone.  The physicians and staff at [facility] are committed to providing excellent care. You may receive a survey requesting feedback about your experience at our office. We strive to receive "very good" responses to the survey questions. If you feel that your experience would prevent you from giving the office a "very good " response, please contact our office to try to remedy the situation. We may be reached at [phone]. Thank you for taking the time out of your busy day to complete the survey.

## 2018-12-31 DIAGNOSIS — J9601 Acute respiratory failure with hypoxia: Secondary | ICD-10-CM | POA: Diagnosis not present

## 2018-12-31 DIAGNOSIS — J189 Pneumonia, unspecified organism: Secondary | ICD-10-CM | POA: Diagnosis not present

## 2018-12-31 DIAGNOSIS — I129 Hypertensive chronic kidney disease with stage 1 through stage 4 chronic kidney disease, or unspecified chronic kidney disease: Secondary | ICD-10-CM | POA: Diagnosis not present

## 2018-12-31 DIAGNOSIS — R531 Weakness: Secondary | ICD-10-CM | POA: Diagnosis not present

## 2018-12-31 DIAGNOSIS — D631 Anemia in chronic kidney disease: Secondary | ICD-10-CM | POA: Diagnosis not present

## 2018-12-31 DIAGNOSIS — N184 Chronic kidney disease, stage 4 (severe): Secondary | ICD-10-CM | POA: Diagnosis not present

## 2018-12-31 DIAGNOSIS — E1165 Type 2 diabetes mellitus with hyperglycemia: Secondary | ICD-10-CM | POA: Diagnosis not present

## 2018-12-31 DIAGNOSIS — E1122 Type 2 diabetes mellitus with diabetic chronic kidney disease: Secondary | ICD-10-CM | POA: Diagnosis not present

## 2018-12-31 DIAGNOSIS — C50012 Malignant neoplasm of nipple and areola, left female breast: Secondary | ICD-10-CM | POA: Diagnosis not present

## 2018-12-31 DIAGNOSIS — G9341 Metabolic encephalopathy: Secondary | ICD-10-CM | POA: Diagnosis not present

## 2019-01-02 DIAGNOSIS — C50012 Malignant neoplasm of nipple and areola, left female breast: Secondary | ICD-10-CM | POA: Diagnosis not present

## 2019-01-02 DIAGNOSIS — D631 Anemia in chronic kidney disease: Secondary | ICD-10-CM | POA: Diagnosis not present

## 2019-01-02 DIAGNOSIS — J189 Pneumonia, unspecified organism: Secondary | ICD-10-CM | POA: Diagnosis not present

## 2019-01-02 DIAGNOSIS — E1165 Type 2 diabetes mellitus with hyperglycemia: Secondary | ICD-10-CM | POA: Diagnosis not present

## 2019-01-02 DIAGNOSIS — G9341 Metabolic encephalopathy: Secondary | ICD-10-CM | POA: Diagnosis not present

## 2019-01-02 DIAGNOSIS — I129 Hypertensive chronic kidney disease with stage 1 through stage 4 chronic kidney disease, or unspecified chronic kidney disease: Secondary | ICD-10-CM | POA: Diagnosis not present

## 2019-01-02 DIAGNOSIS — J9601 Acute respiratory failure with hypoxia: Secondary | ICD-10-CM | POA: Diagnosis not present

## 2019-01-02 DIAGNOSIS — N184 Chronic kidney disease, stage 4 (severe): Secondary | ICD-10-CM | POA: Diagnosis not present

## 2019-01-02 DIAGNOSIS — R531 Weakness: Secondary | ICD-10-CM | POA: Diagnosis not present

## 2019-01-02 DIAGNOSIS — E1122 Type 2 diabetes mellitus with diabetic chronic kidney disease: Secondary | ICD-10-CM | POA: Diagnosis not present

## 2019-01-09 DIAGNOSIS — E1165 Type 2 diabetes mellitus with hyperglycemia: Secondary | ICD-10-CM | POA: Diagnosis not present

## 2019-01-09 DIAGNOSIS — C50919 Malignant neoplasm of unspecified site of unspecified female breast: Secondary | ICD-10-CM | POA: Diagnosis not present

## 2019-01-09 DIAGNOSIS — M545 Low back pain: Secondary | ICD-10-CM | POA: Diagnosis not present

## 2019-01-09 DIAGNOSIS — I10 Essential (primary) hypertension: Secondary | ICD-10-CM | POA: Diagnosis not present

## 2019-01-15 DIAGNOSIS — Z961 Presence of intraocular lens: Secondary | ICD-10-CM | POA: Diagnosis not present

## 2019-01-15 DIAGNOSIS — H401132 Primary open-angle glaucoma, bilateral, moderate stage: Secondary | ICD-10-CM | POA: Diagnosis not present

## 2019-01-15 DIAGNOSIS — E113293 Type 2 diabetes mellitus with mild nonproliferative diabetic retinopathy without macular edema, bilateral: Secondary | ICD-10-CM | POA: Diagnosis not present

## 2019-01-20 ENCOUNTER — Other Ambulatory Visit: Payer: Self-pay

## 2019-01-20 ENCOUNTER — Inpatient Hospital Stay: Payer: Medicare HMO | Admitting: Adult Health

## 2019-01-20 ENCOUNTER — Ambulatory Visit: Payer: Medicare HMO | Admitting: Adult Health

## 2019-01-20 ENCOUNTER — Inpatient Hospital Stay: Payer: Medicare HMO

## 2019-01-20 ENCOUNTER — Ambulatory Visit: Payer: Medicare HMO

## 2019-01-20 ENCOUNTER — Encounter: Payer: Self-pay | Admitting: Adult Health

## 2019-01-20 ENCOUNTER — Ambulatory Visit (HOSPITAL_COMMUNITY)
Admission: RE | Admit: 2019-01-20 | Discharge: 2019-01-20 | Disposition: A | Discharge status: Home / Self Care | Payer: Medicare HMO | Source: Ambulatory Visit | Attending: Adult Health | Admitting: Adult Health

## 2019-01-20 ENCOUNTER — Other Ambulatory Visit: Payer: Medicare HMO

## 2019-01-20 ENCOUNTER — Inpatient Hospital Stay: Payer: Medicare HMO | Attending: Oncology

## 2019-01-20 VITALS — BP 130/83 | HR 60 | Temp 98.3°F | Resp 18 | Ht 60.0 in | Wt 206.2 lb

## 2019-01-20 VITALS — BP 104/88 | HR 73 | Temp 98.3°F | Resp 20

## 2019-01-20 DIAGNOSIS — K7460 Unspecified cirrhosis of liver: Secondary | ICD-10-CM | POA: Insufficient documentation

## 2019-01-20 DIAGNOSIS — N189 Chronic kidney disease, unspecified: Secondary | ICD-10-CM | POA: Diagnosis not present

## 2019-01-20 DIAGNOSIS — Z170 Estrogen receptor positive status [ER+]: Secondary | ICD-10-CM | POA: Insufficient documentation

## 2019-01-20 DIAGNOSIS — Z85828 Personal history of other malignant neoplasm of skin: Secondary | ICD-10-CM | POA: Diagnosis not present

## 2019-01-20 DIAGNOSIS — C50812 Malignant neoplasm of overlapping sites of left female breast: Secondary | ICD-10-CM | POA: Diagnosis not present

## 2019-01-20 DIAGNOSIS — M47812 Spondylosis without myelopathy or radiculopathy, cervical region: Secondary | ICD-10-CM | POA: Diagnosis not present

## 2019-01-20 DIAGNOSIS — C50312 Malignant neoplasm of lower-inner quadrant of left female breast: Secondary | ICD-10-CM

## 2019-01-20 DIAGNOSIS — E119 Type 2 diabetes mellitus without complications: Secondary | ICD-10-CM | POA: Insufficient documentation

## 2019-01-20 DIAGNOSIS — C50912 Malignant neoplasm of unspecified site of left female breast: Secondary | ICD-10-CM

## 2019-01-20 DIAGNOSIS — D649 Anemia, unspecified: Secondary | ICD-10-CM | POA: Diagnosis not present

## 2019-01-20 DIAGNOSIS — Z87442 Personal history of urinary calculi: Secondary | ICD-10-CM | POA: Diagnosis not present

## 2019-01-20 DIAGNOSIS — E1140 Type 2 diabetes mellitus with diabetic neuropathy, unspecified: Secondary | ICD-10-CM

## 2019-01-20 DIAGNOSIS — Z79818 Long term (current) use of other agents affecting estrogen receptors and estrogen levels: Secondary | ICD-10-CM | POA: Diagnosis not present

## 2019-01-20 DIAGNOSIS — M549 Dorsalgia, unspecified: Secondary | ICD-10-CM | POA: Insufficient documentation

## 2019-01-20 DIAGNOSIS — C50012 Malignant neoplasm of nipple and areola, left female breast: Secondary | ICD-10-CM | POA: Insufficient documentation

## 2019-01-20 DIAGNOSIS — IMO0002 Reserved for concepts with insufficient information to code with codable children: Secondary | ICD-10-CM

## 2019-01-20 DIAGNOSIS — M47816 Spondylosis without myelopathy or radiculopathy, lumbar region: Secondary | ICD-10-CM | POA: Diagnosis not present

## 2019-01-20 DIAGNOSIS — I129 Hypertensive chronic kidney disease with stage 1 through stage 4 chronic kidney disease, or unspecified chronic kidney disease: Secondary | ICD-10-CM | POA: Diagnosis not present

## 2019-01-20 DIAGNOSIS — E785 Hyperlipidemia, unspecified: Secondary | ICD-10-CM | POA: Diagnosis not present

## 2019-01-20 DIAGNOSIS — M129 Arthropathy, unspecified: Secondary | ICD-10-CM | POA: Diagnosis not present

## 2019-01-20 DIAGNOSIS — M546 Pain in thoracic spine: Secondary | ICD-10-CM | POA: Diagnosis not present

## 2019-01-20 LAB — CBC WITH DIFFERENTIAL/PLATELET
Abs Immature Granulocytes: 0.01 10*3/uL (ref 0.00–0.07)
Basophils Absolute: 0.1 10*3/uL (ref 0.0–0.1)
Basophils Relative: 2 %
Eosinophils Absolute: 0.1 10*3/uL (ref 0.0–0.5)
Eosinophils Relative: 4 %
HCT: 34.7 % — ABNORMAL LOW (ref 36.0–46.0)
Hemoglobin: 11.5 g/dL — ABNORMAL LOW (ref 12.0–15.0)
Immature Granulocytes: 0 %
Lymphocytes Relative: 40 %
Lymphs Abs: 1 10*3/uL (ref 0.7–4.0)
MCH: 38.5 pg — ABNORMAL HIGH (ref 26.0–34.0)
MCHC: 33.1 g/dL (ref 30.0–36.0)
MCV: 116.1 fL — ABNORMAL HIGH (ref 80.0–100.0)
Monocytes Absolute: 0.2 10*3/uL (ref 0.1–1.0)
Monocytes Relative: 8 %
Neutro Abs: 1.1 10*3/uL — ABNORMAL LOW (ref 1.7–7.7)
Neutrophils Relative %: 46 %
Platelets: 92 10*3/uL — ABNORMAL LOW (ref 150–400)
RBC: 2.99 MIL/uL — ABNORMAL LOW (ref 3.87–5.11)
RDW: 14.4 % (ref 11.5–15.5)
WBC: 2.5 10*3/uL — ABNORMAL LOW (ref 4.0–10.5)
nRBC: 0 % (ref 0.0–0.2)

## 2019-01-20 LAB — COMPREHENSIVE METABOLIC PANEL
ALT: 12 U/L (ref 0–44)
AST: 26 U/L (ref 15–41)
Albumin: 3.2 g/dL — ABNORMAL LOW (ref 3.5–5.0)
Alkaline Phosphatase: 100 U/L (ref 38–126)
Anion gap: 9 (ref 5–15)
BUN: 20 mg/dL (ref 8–23)
CO2: 26 mmol/L (ref 22–32)
Calcium: 8.1 mg/dL — ABNORMAL LOW (ref 8.9–10.3)
Chloride: 102 mmol/L (ref 98–111)
Creatinine, Ser: 1.92 mg/dL — ABNORMAL HIGH (ref 0.44–1.00)
GFR calc Af Amer: 27 mL/min — ABNORMAL LOW (ref >60)
GFR calc non Af Amer: 24 mL/min — ABNORMAL LOW (ref >60)
Glucose, Bld: 451 mg/dL — ABNORMAL HIGH (ref 70–99)
Potassium: 4.1 mmol/L (ref 3.5–5.1)
Sodium: 137 mmol/L (ref 135–145)
Total Bilirubin: 0.6 mg/dL (ref 0.3–1.2)
Total Protein: 6.3 g/dL — ABNORMAL LOW (ref 6.5–8.1)

## 2019-01-20 MED ORDER — FULVESTRANT 250 MG/5ML IM SOLN
500.0000 mg | Freq: Once | INTRAMUSCULAR | Status: AC
  Administered 2019-01-20: 500 mg via INTRAMUSCULAR

## 2019-01-20 MED ORDER — FULVESTRANT 250 MG/5ML IM SOLN
INTRAMUSCULAR | Status: AC
  Filled 2019-01-20: qty 10

## 2019-01-20 NOTE — Progress Notes (Signed)
[phone]

## 2019-01-20 NOTE — Progress Notes (Addendum)
[place]  [phone] [phone]    ID: MARSHEILA ALEJO DOB: 1933-06-03  MR#: [id number]  FTD#:[id number]  Patient Care Team: Sinda Du, MD as PCP - General (Internal Medicine) Gala Romney, Cristopher Estimable, MD as Consulting Physician (Gastroenterology) Magrinat, Virgie Dad, MD as Consulting Physician (Oncology) Jovita Kussmaul, MD as Consulting Physician (General Surgery) Gery Pray, MD as Consulting Physician (Radiation Oncology) Erline Levine, MD as Consulting Physician (Neurosurgery) Scot Dock, NP OTHER MD:   CHIEF COMPLAINT: Recurrent estrogen receptor positive breast cancer  CURRENT TREATMENT: FULVESTRANT; PALBOCICLIB  INTERVAL HISTORY: Doniqua returns today for follow-up and treatment of her recurrent estrogen receptor positive breast cancer accompanied by her daughter.   She continues on palbociclib.  Her dose was reduced to 75 mg every other day.  She is tolerating this much better.   She has been weaker towards the end.  She is due to end later this week.    She also continues on fulvestrant, back to every 4 weeks.  This has never caused her any problems.   REVIEW OF SYSTEMS: Evelia is doing moderately well today.  She has had worsening back pain over the past three weeks.  She was having myoclonic jerks and went to see neurology.  This was attributed to Gabapentin that she was taking '600mg'$  TID PRN.  She was dose decreased on Gabapentin and she is taking '300mg'$  BID in combination with tylenol.  They wonder if this could be related.  She has h/o chronic back pain and has had 5 back surgeries.    [place]    HISTORY OF CURRENT ILLNESS: From the original intake note  JAKEISHA STRICKER has a history of left breast invasive ductal carcinoma,  status post left lumpectomy and sentinel lymph node sampling 03/12/2012 for a pT1b pN0, stage IA invasive ductal carcinoma, grade 3, strongly estrogen and progesterone receptor positive, HER-2  nonamplified, with an MIB-1-1 of 15%.  The single sentinel lymph node was negative.  There was a positive margin.  She had reexcision on 03/22/2012 for what proved to be a single residual focus of intermediate grade ductal carcinoma in situ.  This was close to the lateral inked margin which was however negative.  She saw radiation oncology (Drs. Orlene Erm and Valere Dross) who felt adjuvant endocrine therapy alone would be adequate. She started anastrozole under Dr. Jacquiline Doe at [facility], on which she continues.  More recently she underwent routine bilateral diagnostic mammography with tomography and left breast ultrasonography at [facility] on 02/26/2018 showing: highly suspicious 1.6 cm retroareolar left breast mass, and single abnormal left axillary lymph node.  Accordingly on 03/05/2018 she proceeded to biopsy of the left breast area in question. The pathology ([id number]) from this procedure showed: invasive ductal carcinoma, grade 3; ductal carcinoma in the lymph node tissue. Prognostic indicators significant for: estrogen receptor, 95% positive and progesterone receptor, 5% positive, both with strong staining intensity. Proliferation marker Ki67 at 20%. HER2 negative by immunohistochemistry, 1+.  The patient's subsequent history is as detailed below.   PAST MEDICAL HISTORY: Past Medical History:  Diagnosis Date   Abrasion of arm, left    secondary to fall    Anemia    Arthritis    Breast cancer, left (HCC)    er/pr +   Cataract    no surgery as yet,starting of 3 years   Chronic kidney disease    renal stones, one episode of Lithotripsy   Cirrhosis ([place])    Metavir score 4  Complication of anesthesia    Diabetes mellitus    GERD (gastroesophageal reflux disease)    Hyperlipidemia    Hypertension    followed by [place] grp. relative to  urology study grp.  Doesn't report ever having a stress test    Mental disorder    Multiple falls    Nocturia    PONV  (postoperative nausea and vomiting)    Seasonal allergies    Skin cancer    face- melanoma, 2012- surg. excision      PAST SURGICAL HISTORY: Past Surgical History:  Procedure Laterality Date   ABDOMINAL HYSTERECTOMY     BACK SURGERY     x4 back surgery to include rods & screws  . Last in May 2014   BREAST LUMPECTOMY WITH NEEDLE LOCALIZATION AND AXILLARY SENTINEL LYMPH NODE BX  03/12/2012   Procedure: BREAST LUMPECTOMY WITH NEEDLE LOCALIZATION AND AXILLARY SENTINEL LYMPH NODE BX;  Surgeon: Merrie Roof, MD;  Location: [place];  Service: General;  Laterality: Left;   BREAST SURGERY     lumpectomy x2/left    CHOLECYSTECTOMY     COLONOSCOPY N/A 11/20/2013   Dr.Rourk- diverticula was found in the left colon. o/w normal rectal, colonic and terminal ileal mucosa   ESOPHAGOGASTRODUODENOSCOPY N/A 11/20/2013   Dr.Rourk- abnormal mucosa was found. diffuse snake skinning and friability of the gastric mucosa. patent pylorus. abnormal-appearing first, second and third portion of the duodenum. 66m gastric nodule at the GE junction. stomach bx= minimal chronic infammation, GE junction bx= polypoid fragments of gastric cardia type mucosa w/mod chronic inflammation and foveolar hyperplasia   EYE SURGERY     cataract surgery bilat    FRACTURE SURGERY     1975- ORIF- L ankle    GIVENS CAPSULE STUDY N/A 02/24/2014   Procedure: GIVENS CAPSULE STUDY;  Surgeon: RDaneil Dolin MD;  [facility];  Service: Endoscopy;  Laterality: N/A;   RE-EXCISION OF BREAST LUMPECTOMY  03/22/2012   Procedure: RE-EXCISION OF BREAST LUMPECTOMY;  Surgeon: PMerrie Roof MD;  Location: [facility]  Service: General;  Laterality: Left;  re-excision left breast lumpectomy cavity  ER+, PR+   ROTATOR CUFF REPAIR Bilateral    SHOULDER SURGERY     SKIN BIOPSY     TOTAL KNEE ARTHROPLASTY Right 12/18/2014   Procedure: RIGHT TOTAL KNEE ARTHROPLASTY;  Surgeon: RSydnee Cabal MD;  Location: [facility];  Service: Orthopedics;  Laterality: Right;   TUBAL LIGATION      FAMILY HISTORY: Family History  Problem Relation Age of Onset   Dementia Mother    Heart disease Father    Emphysema Father    Cancer Brother        metastatic, unsure primary   Stroke Sister    CAD Brother    Colon cancer Neg Hx    Prostate cancer Neg Hx    Patient father was 631years old when he died from "heart trouble" and emphysema. Patient mother died from mini-stroke complications at age 83 She denies a family hx of breast or ovarian cancer, prostate or pancreatic cancers. She has 3 siblings. She had a twin brother who died at age 7162from metastatic cancer. She had 2 sisters. Her only living sibling is a sister.   GYNECOLOGIC HISTORY:  No LMP recorded. Patient has had a hysterectomy. Menarche: 85years old Age at first live birth: 85years [place] s/p [place] Contraceptive no HRT yes, for more than 5 years  Hysterectomy? Yes, at age 57 BSO? 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 Her husband retired on disability, but he passed away in 2015-05-25 from mini-strokes. She lives at home with 2 cats. Daughter [name], who is in her 75s, is married to [place] (who works for the [place]) and lives very close to the patient.  [name] works for the [employer] division of social services from home.  The patient's son died at age 50 from accidental overdose.  The patient attends [employer] in [place].   ADVANCED DIRECTIVES: Daughter [name] is her HCPOA.   HEALTH MAINTENANCE: Social History   Tobacco Use   Smoking status: Never Smoker   Smokeless tobacco: Never Used  Substance Use Topics   Alcohol use: No   Drug use: No     Colonoscopy: 11/20/2013, Dr. Gala Romney, chronic inflammation  PAP: s/p hysterectomy   Bone density: "had one probably 10 years ago"   Allergies  Allergen Reactions     Bee Venom Anaphylaxis and Swelling   Hydromorphone Hcl Other (See Comments)    Agitation, Confusion   Demerol Nausea Only    Current Outpatient Medications  Medication Sig Dispense Refill   ALPHAGAN P 0.1 % SOLN Place 1 drop into both eyes 2 (two) times daily.      amLODipine (NORVASC) 5 MG tablet Take 5 mg by mouth every morning.      aspirin EC 81 MG tablet Take 81 mg by mouth daily.     atorvastatin (LIPITOR) 10 MG tablet Take 10 mg by mouth every evening.      Bacillus Coagulans-Inulin (PROBIOTIC FORMULA PO) Take 1 capsule by mouth daily.     BD PEN NEEDLE NANO U/F 32G X 4 MM MISC      cyanocobalamin (,VITAMIN B-12,) 1000 MCG/ML injection Inject 1,000 mcg into the muscle every 30 (thirty) days.      dorzolamide (TRUSOPT) 2 % ophthalmic solution Place 1 drop into both eyes 2 (two) times daily.   4   EPIPEN 2-PAK 0.3 MG/0.3ML SOAJ injection Inject 0.3 mg into the muscle as needed for anaphylaxis.      fulvestrant (FASLODEX) 250 MG/5ML injection Inject 10 mLs (500 mg total) into the muscle every 28 (twenty-eight) days. One injection each buttock over 1-2 minutes. Warm prior to use.     gabapentin (NEURONTIN) 600 MG tablet Take 600 mg by mouth daily.      glipiZIDE (GLUCOTROL) 5 MG tablet Take 5 mg by mouth daily before breakfast.      ipratropium-albuterol (DUONEB) 0.5-2.5 (3) MG/3ML SOLN Take 3 mLs by nebulization 3 (three) times daily. 360 mL 3   Lancets (ONETOUCH DELICA PLUS [id number]) MISC      latanoprost (XALATAN) 0.005 % ophthalmic solution Place 1 drop into both eyes at bedtime.      LORazepam (ATIVAN) 0.5 MG tablet Take 0.5 mg by mouth 2 (two) times daily.     metoprolol succinate (TOPROL-XL) 100 MG 24 hr tablet Take 100 mg by mouth every morning. Take with or immediately following a meal.     mirtazapine (REMERON) 15 MG tablet Take 15 mg by mouth at bedtime.      MYRBETRIQ 50 MG TB24 tablet Take 50 mg by mouth daily.      omeprazole (PRILOSEC) 20 MG  capsule Take 20 mg by mouth daily.     ONETOUCH ULTRA test strip  palbociclib (IBRANCE) 75 MG capsule Take 1 capsule (75 mg) by mouth once every other day with food. Take for 21 days on, 7 days off, repeat every 28 days. 11 capsule 6   Polyethyl Glycol-Propyl Glycol (SYSTANE ULTRA) 0.4-0.3 % SOLN Place 1-2 drops into both eyes daily as needed (for itching eyes).     torsemide (DEMADEX) 20 MG tablet Take 1 tablet (20 mg total) by mouth daily. 30 tablet 5   TRESIBA FLEXTOUCH 100 UNIT/ML SOPN FlexTouch Pen Inject 0.5 mLs (50 Units total) into the skin daily. 5 pen 5   No current facility-administered medications for this visit.     OBJECTIVE: There were no vitals filed for this visit. Wt Readings from Last 3 Encounters:  12/30/18 196 lb 12.8 oz (89.3 kg)  12/23/18 196 lb 14.4 oz (89.3 kg)  11/25/18 202 lb 11.2 oz (91.9 kg)   There is no height or weight on file to calculate BMI.   ECOG FS:2 - Symptomatic, <50% confined to bed GENERAL: Patient is a well appearing female in no acute distress HEENT:  Sclerae anicteric.  Oropharynx clear and moist. No ulcerations or evidence of oropharyngeal candidiasis. Neck is supple.  NODES:  No cervical, supraclavicular, or axillary lymphadenopathy palpated.  BREAST EXAM:  Deferred. LUNGS:  Clear to auscultation bilaterally.  No wheezes or rhonchi. HEART:  Regular rate and rhythm. No murmur appreciated. ABDOMEN:  Soft, nontender.  Positive, normoactive bowel sounds. No organomegaly palpated. MSK:  No focal spinal tenderness to palpation. Full range of motion bilaterally in the upper extremities. EXTREMITIES:  No peripheral edema.   SKIN:  Clear with no obvious rashes or skin changes. No nail dyscrasia. NEURO:  Nonfocal. Well oriented.  Appropriate affect.  11/25/2018 on the left, 11/23 on the right    LAB RESULTS:  CMP     Component Value Date/Time   NA 141 12/23/2018 1022   K 3.7 12/23/2018 1022   CL 103 12/23/2018 1022   CO2 23  12/23/2018 1022   GLUCOSE 410 (H) 12/23/2018 1022   BUN 34 (H) 12/23/2018 1022   CREATININE 2.47 (H) 12/23/2018 1022   CREATININE 2.18 (H) 10/08/2018 1146   CALCIUM 8.3 (L) 12/23/2018 1022   PROT 6.4 (L) 12/23/2018 1022   ALBUMIN 3.1 (L) 12/23/2018 1022   AST 21 12/23/2018 1022   AST 27 10/08/2018 1146   ALT 14 12/23/2018 1022   ALT 15 10/08/2018 1146   ALKPHOS 104 12/23/2018 1022   BILITOT 0.9 12/23/2018 1022   BILITOT 1.0 10/08/2018 1146   GFRNONAA 18 (L) 12/23/2018 1022   GFRNONAA 20 (L) 10/08/2018 1146   GFRAA 20 (L) 12/23/2018 1022   GFRAA 24 (L) 10/08/2018 1146    No results found for: TOTALPROTELP, ALBUMINELP, A1GS, A2GS, BETS, BETA2SER, GAMS, MSPIKE, SPEI  No results found for: KPAFRELGTCHN, LAMBDASER, KAPLAMBRATIO  Lab Results  Component Value Date   WBC 4.8 12/23/2018   NEUTROABS 2.8 12/23/2018   HGB 12.1 12/23/2018   HCT 35.9 (L) 12/23/2018   MCV 115.1 (H) 12/23/2018   PLT 144 (L) 12/23/2018    '@LASTCHEMISTRY'$ @  No results found for: LABCA2  No components found for: [id number]  No results for input(s): INR in the last 168 hours.  No results found for: LABCA2  No results found for: VOP929  No results found for: CAN125  No results found for: WKM628  Lab Results  Component Value Date   CA2729 25.6 04/26/2018    No components found for: HGQUANT  No results  found for: CEA1 / No results found for: CEA1   No results found for: AFPTUMOR  No results found for: CHROMOGRNA  No results found for: PSA1  No visits with results within 3 Day(s) from this visit.  Latest known visit with results is:  Appointment on 12/23/2018  Component Date Value Ref Range Status   Sodium 12/23/2018 141  135 - 145 mmol/L Final   Potassium 12/23/2018 3.7  3.5 - 5.1 mmol/L Final   Chloride 12/23/2018 103  98 - 111 mmol/L Final   CO2 12/23/2018 23  22 - 32 mmol/L Final   Glucose, Bld 12/23/2018 410* 70 - 99 mg/dL Final   BUN 12/23/2018 34* 8 - 23 mg/dL Final    Creatinine, Ser 12/23/2018 2.47* 0.44 - 1.00 mg/dL Final   Calcium 12/23/2018 8.3* 8.9 - 10.3 mg/dL Final   Total Protein 12/23/2018 6.4* 6.5 - 8.1 g/dL Final   Albumin 12/23/2018 3.1* 3.5 - 5.0 g/dL Final   AST 12/23/2018 21  15 - 41 U/L Final   ALT 12/23/2018 14  0 - 44 U/L Final   Alkaline Phosphatase 12/23/2018 104  38 - 126 U/L Final   Total Bilirubin 12/23/2018 0.9  0.3 - 1.2 mg/dL Final   GFR calc non Af Amer 12/23/2018 18* >60 mL/min Final   GFR calc Af Amer 12/23/2018 20* >60 mL/min Final   Anion gap 12/23/2018 15  5 - 15 Final   Performed at [facility], [place] [address], [place], [place] [address]   WBC 12/23/2018 4.8  4.0 - 10.5 K/uL Final   RBC 12/23/2018 3.12* 3.87 - 5.11 MIL/uL Final   Hemoglobin 12/23/2018 12.1  12.0 - 15.0 g/dL Final   HCT 12/23/2018 35.9* 36.0 - 46.0 % Final   MCV 12/23/2018 115.1* 80.0 - 100.0 fL Final   MCH 12/23/2018 38.8* 26.0 - 34.0 pg Final   MCHC 12/23/2018 33.7  30.0 - 36.0 g/dL Final   RDW 12/23/2018 14.3  11.5 - 15.5 % Final   Platelets 12/23/2018 144* 150 - 400 K/uL Final   nRBC 12/23/2018 0.4* 0.0 - 0.2 % Final   Neutrophils Relative % 12/23/2018 56  % Final   Neutro Abs 12/23/2018 2.8  1.7 - 7.7 K/uL Final   Lymphocytes Relative 12/23/2018 29  % Final   Lymphs Abs 12/23/2018 1.4  0.7 - 4.0 K/uL Final   Monocytes Relative 12/23/2018 9  % Final   Monocytes Absolute 12/23/2018 0.4  0.1 - 1.0 K/uL Final   Eosinophils Relative 12/23/2018 2  % Final   Eosinophils Absolute 12/23/2018 0.1  0.0 - 0.5 K/uL Final   Basophils Relative 12/23/2018 3  % Final   Basophils Absolute 12/23/2018 0.1  0.0 - 0.1 K/uL Final   Immature Granulocytes 12/23/2018 1  % Final   Abs Immature Granulocytes 12/23/2018 0.03  0.00 - 0.07 K/uL Final   Performed at [facility], [place] [address], [place], [place] [address]    (this displays the last labs from the last 3 days)  No results  found for: TOTALPROTELP, ALBUMINELP, A1GS, A2GS, BETS, BETA2SER, GAMS, MSPIKE, SPEI (this displays SPEP labs)  No results found for: KPAFRELGTCHN, LAMBDASER, KAPLAMBRATIO (kappa/lambda light chains)  No results found for: HGBA, HGBA2QUANT, HGBFQUANT, HGBSQUAN (Hemoglobinopathy evaluation)   No results found for: LDH  No results found for: IRON, TIBC, IRONPCTSAT (Iron and TIBC)  No results found for: FERRITIN  Urinalysis    Component Value Date/Time   COLORURINE YELLOW 11/19/2018  [place] 11/19/2018 1455   LABSPEC 1.009 11/19/2018 1455   PHURINE 6.0 11/19/2018 1455   GLUCOSEU >=500 (A) 11/19/2018 1455   HGBUR NEGATIVE 11/19/2018 1455   BILIRUBINUR NEGATIVE 11/19/2018 1455   KETONESUR NEGATIVE 11/19/2018 1455   PROTEINUR NEGATIVE 11/19/2018 1455   UROBILINOGEN 1.0 12/11/2014 1106   NITRITE NEGATIVE 11/19/2018 1455   LEUKOCYTESUR SMALL (A) 11/19/2018 1455     STUDIES: No results found.   ELIGIBLE FOR AVAILABLE RESEARCH PROTOCOL: no   ASSESSMENT: 85 y.o. [place] woman status post left lumpectomy and sentinel lymph node sampling 03/12/2012 for a  pT1b pN0, stage IA invasive ductal carcinoma, grade 3, strongly estrogen and progesterone receptor negative, HER-2 nonamplified, with an MIB-1 of 15%  (a) additional surgery for margin clearance was successful 03/22/2012  (1) the patient opted against adjuvant radiation  (2) anastrozole started March 2014, discontinued 04/03/2018 with evidence of recurrence  RECURRENT DISEASE: February 2020 (3) status post left breast retroareolar biopsy 03/05/2018 for a clinical T1c N1, stage IIA invasive ductal carcinoma, estrogen and progesterone receptor positive, HER-2 not amplified, with an MIB-1 of 20%.  (4) spread to skin/chest wall on left noted 04/03/2018  (a) chest CT 04/11/2018 scan showed nonspecific bilateral axillary adenopathy   (b) bone scan 04/11/2018 shows only degenerative disease  (c) CA 27-29 not  informative (was 25.6 on 04/26/2018)  (d) biopsy of 2 left breast skin nodules 04/04/2018 shows invasive carcinoma, estrogen receptor 100% positive, progesterone receptor and HER-2 negative, with an MIB-1 of 30%.  (5) started fulvestrant 04/12/2018  (a) added palbociclib 04/26/2018 at 125 mg p.o. daily, 21 days on, 7 days off   (1) dose decreased to 75 mg daily, 21/03 September 2018   (2) dose decreased to 75 mg every other day, 21/7 with October 2020 cycle  (b) tamoxifen added and fulvestrant changed to every 8 weeks after 08/13/2018 dose to minimize exposure during the pandemic, discontinued when monthly fulvestrant resumed  (c) fulvestrant resumed at every 4 weeks beginning with 11/25/2018 dose  (6) CT of the head, chest, abdomen, and pelvis August/September 2020 shows no evidence of metastatic disease outside of the left breast  PLAN: Sherlynn continues on Fulvestrant and palbociclib with good tolerance.  Her ANC is 1.1 today and her plt count is 92.  She is finishing up her third week and will do this on Friday.  She thinks she missed a dose of the Palbociclib and she will just forego this dose.    Pamala Hurry met with Dr. Jana Hakim, who reviewed her images above, as her cancer is questionably slightly worse.  Based on the pictures, and his evaluation, it is unclear.  She will receive the fulvestrant today and continue on Palbociclib as prescribed.   Jyoti has some increased back pain. I will get some plain films of her spine.  This could be where her chronic issues are since her medications were recently adjusted, however, since there is one area that is new, it should be evaluated.  Her PCP was previously managing her back pain and she is transitioning to a new PCP.  She is happy that the xrays are being completed prior to her transition.    Florinda will return in 4 weeks for labs and f/u with Dr. Jana Hakim, along with an injection.  She was recommended to continue with the appropriate pandemic  precautions. She knows to call for any questions that may arise between now and her next appointment.  We are happy to see her  sooner if needed.   Wilber Bihari, NP  01/20/19 12:10 PM Medical Oncology and Hematology [facility] [place], [place] [address] Tel. [phone]    Fax. [phone]   ADDENDUM: Sharda situation is difficult.  We certainly do not want her chest wall tumor to continue to spread.  On the other hand reviewing her images and exam it is not clear to me that we have progression.  If so it is very minimal.  I think in the absence of clear progression, given her age comorbidities and the fact that the alternatives will be much more difficult on her, it would be wise to continue the current course and reassess after 1 month.  The patient and her daughter are very much in favor of this plan.  If we do document progression we will likely go to capecitabine.  I personally saw this patient and performed a substantive portion of this encounter with the listed APP documented above.   Chauncey Cruel, MD Medical Oncology and Hematology [facility] [address] [place], [place] [address] Tel. [phone]    Fax. [phone]

## 2019-01-20 NOTE — Patient Instructions (Signed)
Fulvestrant injection What is this medicine? FULVESTRANT (ful VES trant) blocks the effects of estrogen. It is used to treat breast cancer. This medicine may be used for other purposes; ask your health care provider or pharmacist if you have questions. COMMON BRAND NAME(S): FASLODEX What should I tell my health care provider before I take this medicine? They need to know if you have any of these conditions:  bleeding disorders  liver disease  low blood counts, like low white cell, platelet, or red cell counts  an unusual or allergic reaction to fulvestrant, other medicines, foods, dyes, or preservatives  pregnant or trying to get pregnant  breast-feeding How should I use this medicine? This medicine is for injection into a muscle. It is usually given by a health care professional in a hospital or clinic setting. Talk to your pediatrician regarding the use of this medicine in children. Special care may be needed. Overdosage: If you think you have taken too much of this medicine contact a poison control center or emergency room at once. NOTE: This medicine is only for you. Do not share this medicine with others. What if I miss a dose? It is important not to miss your dose. Call your doctor or health care professional if you are unable to keep an appointment. What may interact with this medicine?  medicines that treat or prevent blood clots like warfarin, enoxaparin, dalteparin, apixaban, dabigatran, and rivaroxaban This list may not describe all possible interactions. Give your health care provider a list of all the medicines, herbs, non-prescription drugs, or dietary supplements you use. Also tell them if you smoke, drink alcohol, or use illegal drugs. Some items may interact with your medicine. What should I watch for while using this medicine? Your condition will be monitored carefully while you are receiving this medicine. You will need important blood work done while you are taking  this medicine. Do not become pregnant while taking this medicine or for at least 1 year after stopping it. Women of child-bearing potential will need to have a negative pregnancy test before starting this medicine. Women should inform their doctor if they wish to become pregnant or think they might be pregnant. There is a potential for serious side effects to an unborn child. Men should inform their doctors if they wish to father a child. This medicine may lower sperm counts. Talk to your health care professional or pharmacist for more information. Do not breast-feed an infant while taking this medicine or for 1 year after the last dose. What side effects may I notice from receiving this medicine? Side effects that you should report to your doctor or health care professional as soon as possible:  allergic reactions like skin rash, itching or hives, swelling of the face, lips, or tongue  feeling faint or lightheaded, falls  pain, tingling, numbness, or weakness in the legs  signs and symptoms of infection like fever or chills; cough; flu-like symptoms; sore throat  vaginal bleeding Side effects that usually do not require medical attention (report to your doctor or health care professional if they continue or are bothersome):  aches, pains  constipation  diarrhea  headache  hot flashes  nausea, vomiting  pain at site where injected  stomach pain This list may not describe all possible side effects. Call your doctor for medical advice about side effects. You may report side effects to FDA at 1-800-FDA-1088. Where should I keep my medicine? This drug is given in a hospital or clinic and will   not be stored at home. NOTE: This sheet is a summary. It may not cover all possible information. If you have questions about this medicine, talk to your doctor, pharmacist, or health care provider.  2020 Elsevier/Gold Standard (2017-05-24 11:34:41) Coronavirus (COVID-19) Are you at risk?  Are  you at risk for the Coronavirus (COVID-19)?  To be considered HIGH RISK for Coronavirus (COVID-19), you have to meet the following criteria:  . Traveled to [place], [place], [place], [place] or [place]; or in the [place] to [place], [place], [place], or [place]; and have fever, cough, and shortness of breath within the last 2 weeks of travel OR . Been in close contact with a person diagnosed with COVID-19 within the last 2 weeks and have fever, cough, and shortness of breath . IF YOU DO NOT MEET THESE CRITERIA, YOU ARE CONSIDERED LOW RISK FOR COVID-19.  What to do if you are HIGH RISK for COVID-19?  . If you are having a medical emergency, call 911. . Seek medical care right away. Before you go to a doctor's office, urgent care or emergency department, call ahead and tell them about your recent travel, contact with someone diagnosed with COVID-19, and your symptoms. You should receive instructions from your physician's office regarding next steps of care.  . When you arrive at healthcare provider, tell the healthcare staff immediately you have returned from visiting [place], [place], [place], [place] or [place]; or traveled in the [place] to [place], [place], [place], or [place]; in the last two weeks or you have been in close contact with a person diagnosed with COVID-19 in the last 2 weeks.   . Tell the health care staff about your symptoms: fever, cough and shortness of breath. . After you have been seen by a medical provider, you will be either: o Tested for (COVID-19) and discharged home on quarantine except to seek medical care if symptoms worsen, and asked to  - Stay home and avoid contact with others until you get your results (4-5 days)  - Avoid travel on public transportation if possible (such as bus, train, or airplane) or o Sent to the Emergency Department by EMS for evaluation, COVID-19 testing, and possible admission depending on your condition and test  results.  What to do if you are LOW RISK for COVID-19?  Reduce your risk of any infection by using the same precautions used for avoiding the common cold or flu:  . Wash your hands often with soap and warm water for at least 20 seconds.  If soap and water are not readily available, use an alcohol-based hand sanitizer with at least 60% alcohol.  . If coughing or sneezing, cover your mouth and nose by coughing or sneezing into the elbow areas of your shirt or coat, into a tissue or into your sleeve (not your hands). . Avoid shaking hands with others and consider head nods or verbal greetings only. . Avoid touching your eyes, nose, or mouth with unwashed hands.  . Avoid close contact with people who are sick. . Avoid places or events with large numbers of people in one location, like concerts or sporting events. . Carefully consider travel plans you have or are making. . If you are planning any travel outside or inside the [place], visit the CDC's Travelers' Health webpage for the latest health notices. . If you have some symptoms but not all symptoms, continue to monitor at home and seek medical attention if your symptoms   worsen. . If you are having a medical emergency, call 911.   ADDITIONAL HEALTHCARE OPTIONS FOR PATIENTS  [facility] / e-Visit: [email or web address]         [facility]: [phone]  [facility]: [phone]                   [facility]: [phone]   

## 2019-01-21 ENCOUNTER — Telehealth: Payer: Self-pay

## 2019-01-21 NOTE — Telephone Encounter (Signed)
-----   Message from Gardenia Phlegm, NP sent at 01/21/2019 11:07 AM EST ----- Please call patient and her daughter.  Let them know that we saw no new issues in her back on the xrays.  I would recommend she f/u with PCP about this, as the increase in pain is likely due to the pain medication change.  She should let us know if it persists, or worsens, though, because then would consider ordering MRI. ----- Message ----- From: Interface, Rad Results In Sent: 01/21/2019   3:10 AM EST To: Gardenia Phlegm, NP

## 2019-01-21 NOTE — Telephone Encounter (Signed)
Attempted to contact patient and daughter but no answer at this time.

## 2019-01-28 MED FILL — IBRANCE 75 MG CAPSULE: 75 | 28 days supply | Qty: 11 | Fill #2

## 2019-01-30 DIAGNOSIS — Z7901 Long term (current) use of anticoagulants: Secondary | ICD-10-CM | POA: Diagnosis not present

## 2019-02-06 DIAGNOSIS — M545 Low back pain: Secondary | ICD-10-CM | POA: Diagnosis not present

## 2019-02-06 DIAGNOSIS — I129 Hypertensive chronic kidney disease with stage 1 through stage 4 chronic kidney disease, or unspecified chronic kidney disease: Secondary | ICD-10-CM | POA: Diagnosis not present

## 2019-02-06 DIAGNOSIS — N184 Chronic kidney disease, stage 4 (severe): Secondary | ICD-10-CM | POA: Diagnosis not present

## 2019-02-06 DIAGNOSIS — E1121 Type 2 diabetes mellitus with diabetic nephropathy: Secondary | ICD-10-CM | POA: Diagnosis not present

## 2019-02-16 NOTE — Progress Notes (Signed)
[place]  [phone] [phone]    ID: [name] DOB: [birth year]  MR#: [id number]  BCW#:[id number]  Patient Care Team: Sinda Du, MD as PCP - General (Internal Medicine) Gala Romney Cristopher Estimable, MD as Consulting Physician (Gastroenterology) Daryll Spisak, Virgie Dad, MD as Consulting Physician (Oncology) Jovita Kussmaul, MD as Consulting Physician (General Surgery) Gery Pray, MD as Consulting Physician (Radiation Oncology) Erline Levine, MD as Consulting Physician (Neurosurgery) Chauncey Cruel, MD OTHER MD:   CHIEF COMPLAINT: Recurrent estrogen receptor positive breast cancer  CURRENT TREATMENT: To start capecitabine 03/02/2018   INTERVAL HISTORY: [name] returns today for follow-up and treatment of her recurrent estrogen receptor positive breast cancer accompanied by her daughter.   She continues on palbociclib.  Her dose was reduced to 75 mg every other day.  This was helpful and she has a little bit more energy.  She has had some mouth sores with this which are being treated by her primary care physician successfully.  She also continues on fulvestrant, back to every 4 weeks.  She has tolerated this with no side effects that she is aware of  At her last visit on 01/20/2019, she underwent x-rays of her entire spine and bilateral ribs, all of which were negative for suspicious osseous lesions.   REVIEW OF SYSTEMS: [name] is concerned that the cancer on her chest is growing.  In the meantime she has no systemic symptoms and specifically no unusual headaches visual changes cough phlegm production or pleurisy or change in bowel or bladder habits.  Currently she has no mouth sores.  She has had no fever rash bleeding or unexplained weight loss  A detailed review of systems today was stable   HISTORY OF CURRENT ILLNESS: From the original intake note  [name] has a history of left breast invasive ductal carcinoma,  status post  left lumpectomy and sentinel lymph node sampling 03/12/2012 for a pT1b pN0, stage IA invasive ductal carcinoma, grade 3, strongly estrogen and progesterone receptor positive, HER-2 nonamplified, with an MIB-1-1 of 15%.  The single sentinel lymph node was negative.  There was a positive margin.  She had reexcision on 03/22/2012 for what proved to be a single residual focus of intermediate grade ductal carcinoma in situ.  This was close to the lateral inked margin which was however negative.  She saw radiation oncology (Drs. Orlene Erm and Valere Dross) who felt adjuvant endocrine therapy alone would be adequate. She started anastrozole under Dr. Jacquiline Doe at [facility], on which she continues.  More recently she underwent routine bilateral diagnostic mammography with tomography and left breast ultrasonography at [facility] on 02/26/2018 showing: highly suspicious 1.6 cm retroareolar left breast mass, and single abnormal left axillary lymph node.  Accordingly on 03/05/2018 she proceeded to biopsy of the left breast area in question. The pathology ([id number]) from this procedure showed: invasive ductal carcinoma, grade 3; ductal carcinoma in the lymph node tissue. Prognostic indicators significant for: estrogen receptor, 95% positive and progesterone receptor, 5% positive, both with strong staining intensity. Proliferation marker Ki67 at 20%. HER2 negative by immunohistochemistry, 1+.  The patient's subsequent history is as detailed below.   PAST MEDICAL HISTORY: Past Medical History:  Diagnosis Date  . Abrasion of arm, left    secondary to fall   . Anemia   . Arthritis   . Breast cancer, left ([facility])    er/pr +  . Cataract    no surgery as yet,starting of 3 years  . Chronic  kidney disease    renal stones, one episode of Lithotripsy  . Cirrhosis (HCC)    Metavir score 4  . Complication of anesthesia   . Diabetes mellitus   . GERD (gastroesophageal reflux disease)   . Hyperlipidemia   .  Hypertension    followed by [facility]. relative to  urology study grp.  Doesn't report ever having a stress test   . Mental disorder   . Multiple falls   . Nocturia   . PONV (postoperative nausea and vomiting)   . Seasonal allergies   . Skin cancer    face- melanoma, 2012- surg. excision      PAST SURGICAL HISTORY: Past Surgical History:  Procedure Laterality Date  . ABDOMINAL HYSTERECTOMY    . BACK SURGERY     x4 back surgery to include rods & screws  . Last in May 2014  . BREAST LUMPECTOMY WITH NEEDLE LOCALIZATION AND AXILLARY SENTINEL LYMPH NODE BX  03/12/2012   Procedure: BREAST LUMPECTOMY WITH NEEDLE LOCALIZATION AND AXILLARY SENTINEL LYMPH NODE BX;  Surgeon: Merrie Roof, MD;  Location: [facility];  Service: General;  Laterality: Left;  . BREAST SURGERY     lumpectomy x2/left   . CHOLECYSTECTOMY    . COLONOSCOPY N/A 11/20/2013   Dr.Rourk- diverticula was found in the left colon. o/w normal rectal, colonic and terminal ileal mucosa  . ESOPHAGOGASTRODUODENOSCOPY N/A 11/20/2013   Dr.Rourk- abnormal mucosa was found. diffuse snake skinning and friability of the gastric mucosa. patent pylorus. abnormal-appearing first, second and third portion of the duodenum. 25m gastric nodule at the GE junction. stomach bx= minimal chronic infammation, GE junction bx= polypoid fragments of gastric cardia type mucosa w/mod chronic inflammation and foveolar hyperplasia  . EYE SURGERY     cataract surgery bilat   . FRACTURE SURGERY     1975- OR[facility]- L ankle   . GIVENS CAPSULE STUDY N/A 02/24/2014   Procedure: GIVENS CAPSULE STUDY;  Surgeon: RDaneil Dolin MD;  [facility];  Service: Endoscopy;  Laterality: N/A;  . RE-EXCISION OF BREAST LUMPECTOMY  03/22/2012   Procedure: RE-EXCISION OF BREAST LUMPECTOMY;  Surgeon: PMerrie Roof MD;  [place]  Service: General;  Laterality: Left;  re-excision left breast lumpectomy cavity  ER+, PR+  . ROTATOR CUFF REPAIR  Bilateral   . SHOULDER SURGERY    . SKIN BIOPSY    . TOTAL KNEE ARTHROPLASTY Right 12/18/2014   Procedure: RIGHT TOTAL KNEE ARTHROPLASTY;  Surgeon: RSydnee Cabal MD;  Location[facility];  Service: Orthopedics;  Laterality: Right;  . TUBAL LIGATION      FAMILY HISTORY: Family History  Problem Relation Age of Onset  . Dementia Mother   . Heart disease Father   . Emphysema Father   . Cancer Brother        metastatic, unsure primary  . Stroke Sister   . CAD Brother   . Colon cancer Neg Hx   . Prostate cancer Neg Hx    Patient father was 655years old when he died from "heart trouble" and emphysema. Patient mother died from mini-stroke complications at age 68919 She denies a family hx of breast or ovarian cancer, prostate or pancreatic cancers. She has 3 siblings. She had a twin brother who died at age 6833from metastatic cancer. She had 2 sisters. Her only living sibling is a sister.   GYNECOLOGIC HISTORY:  No LMP recorded. Patient has had a hysterectomy. Menarche: 85years old  Age at first live birth: 85 years old [place] P 2 LMP s/p hysterectomy Contraceptive no HRT yes, for more than 5 years  Hysterectomy? Yes, at age 85 BSO? 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 Her husband retired on disability, but he passed away in May 09, 2015 from mini-strokes. She lives at home with 2 cats. Daughter [name], who is in her 22s, is married to [place] (who works for the [place]) and lives very close to the patient.  [name] works for the [employer] division of social services from home.  The patient's son died at age 44 from accidental overdose.  The patient attends [employer] in [place].   ADVANCED DIRECTIVES: Daughter [name] is her HCPOA.   HEALTH MAINTENANCE: Social History   Tobacco Use  . Smoking status: Never Smoker  . Smokeless tobacco: Never Used  Substance Use Topics  . Alcohol use: No    . Drug use: No     Colonoscopy: 11/20/2013, Dr. Gala Romney, chronic inflammation  PAP: s/p hysterectomy   Bone density: "had one probably 10 years ago"   Allergies  Allergen Reactions  . Bee Venom Anaphylaxis and Swelling  . Hydromorphone Hcl Other (See Comments)    Agitation, Confusion  . Demerol Nausea Only    Current Outpatient Medications  Medication Sig Dispense Refill  . ALPHAGAN P 0.1 % SOLN Place 1 drop into both eyes 2 (two) times daily.     [name] Kitchen amLODipine (NORVASC) 5 MG tablet Take 5 mg by mouth every morning.     [name] Kitchen aspirin EC 81 MG tablet Take 81 mg by mouth daily.    [name] Kitchen atorvastatin (LIPITOR) 10 MG tablet Take 10 mg by mouth every evening.     . Bacillus Coagulans-Inulin (PROBIOTIC FORMULA PO) Take 1 capsule by mouth daily.    . BD PEN NEEDLE NANO U/F 32G X 4 MM MISC     . cyanocobalamin (,VITAMIN B-12,) 1000 MCG/ML injection Inject 1,000 mcg into the muscle every 30 (thirty) days.     . dorzolamide (TRUSOPT) 2 % ophthalmic solution Place 1 drop into both eyes 2 (two) times daily.   4  . EPIPEN 2-PAK 0.3 MG/0.3ML SOAJ injection Inject 0.3 mg into the muscle as needed for anaphylaxis.     . fulvestrant (FASLODEX) 250 MG/5ML injection Inject 10 mLs (500 mg total) into the muscle every 28 (twenty-eight) days. One injection each buttock over 1-2 minutes. Warm prior to use.    . gabapentin (NEURONTIN) 600 MG tablet Take 600 mg by mouth daily.     [name] Kitchen glipiZIDE (GLUCOTROL) 5 MG tablet Take 5 mg by mouth daily before breakfast.     . ipratropium-albuterol (DUONEB) 0.5-2.5 (3) MG/3ML SOLN Take 3 mLs by nebulization 3 (three) times daily. 360 mL 3  . Lancets (ONETOUCH DELICA PLUS [id number]) MISC     . latanoprost (XALATAN) 0.005 % ophthalmic solution Place 1 drop into both eyes at bedtime.     [name] Kitchen LORazepam (ATIVAN) 0.5 MG tablet Take 0.5 mg by mouth 2 (two) times daily.    . metoprolol succinate (TOPROL-XL) 100 MG 24 hr tablet Take 100 mg by mouth every morning. Take with or immediately  following a meal.    . mirtazapine (REMERON) 15 MG tablet Take 15 mg by mouth at bedtime.     [name] Kitchen MYRBETRIQ 50 MG TB24 tablet Take 50 mg by mouth daily.     [name] Kitchen  omeprazole (PRILOSEC) 20 MG capsule Take 20 mg by mouth daily.    Glory Rosebush ULTRA test strip     . palbociclib (IBRANCE) 75 MG capsule Take 1 capsule (75 mg) by mouth once every other day with food. Take for 21 days on, 7 days off, repeat every 28 days. 11 capsule 6  . Polyethyl Glycol-Propyl Glycol (SYSTANE ULTRA) 0.4-0.3 % SOLN Place 1-2 drops into both eyes daily as needed (for itching eyes).    . torsemide (DEMADEX) 20 MG tablet Take 1 tablet (20 mg total) by mouth daily. 30 tablet 5  . TRESIBA FLEXTOUCH 100 UNIT/ML SOPN FlexTouch Pen Inject 0.5 mLs (50 Units total) into the skin daily. 5 pen 5   No current facility-administered medications for this visit.    OBJECTIVE: Older white woman who appears stated age 6:   02/17/19 1252  BP: 126/64  Pulse: 74  Resp: 18  Temp: 98.5 F (36.9 C)  SpO2: 95%   Wt Readings from Last 3 Encounters:  02/17/19 200 lb 11.2 oz (91 kg)  01/20/19 206 lb 3.2 oz (93.5 kg)  12/30/18 196 lb 12.8 oz (89.3 kg)   Body mass index is 39.2 kg/m.   ECOG FS:2 - Symptomatic, <50% confined to bed  Sclerae unicteric, EOMs intact Wearing a mask No cervical or supraclavicular adenopathy Lungs no rales or rhonchi Heart regular rate and rhythm Abd soft, nontender, positive bowel sounds MSK no focal spinal tenderness, no upper extremity lymphedema Neuro: nonfocal, well oriented, appropriate affect Breasts: The right breast is unremarkable.  The left breast is imaged below.  Compared to prior images there has been some evidence of progression.  Left breast 02/17/2019    LAB RESULTS:  CMP     Component Value Date/Time   NA 138 02/17/2019 1225   K 4.5 02/17/2019 1225   CL 101 02/17/2019 1225   CO2 26 02/17/2019 1225   GLUCOSE 449 (H) 02/17/2019 1225   BUN 26 (H) 02/17/2019 1225   CREATININE  2.08 (H) 02/17/2019 1225   CREATININE 2.18 (H) 10/08/2018 1146   CALCIUM 8.5 (L) 02/17/2019 1225   PROT 6.8 02/17/2019 1225   ALBUMIN 3.4 (L) 02/17/2019 1225   AST 29 02/17/2019 1225   AST 27 10/08/2018 1146   ALT 14 02/17/2019 1225   ALT 15 10/08/2018 1146   ALKPHOS 115 02/17/2019 1225   BILITOT 0.6 02/17/2019 1225   BILITOT 1.0 10/08/2018 1146   GFRNONAA 21 (L) 02/17/2019 1225   GFRNONAA 20 (L) 10/08/2018 1146   GFRAA 25 (L) 02/17/2019 1225   GFRAA 24 (L) 10/08/2018 1146    No results found for: TOTALPROTELP, ALBUMINELP, A1GS, A2GS, BETS, BETA2SER, GAMS, MSPIKE, SPEI  No results found for: KPAFRELGTCHN, LAMBDASER, KAPLAMBRATIO  Lab Results  Component Value Date   WBC 3.0 (L) 02/17/2019   NEUTROABS 1.5 (L) 02/17/2019   HGB 12.1 02/17/2019   HCT 36.7 02/17/2019   MCV 115.0 (H) 02/17/2019   PLT 84 (L) 02/17/2019    '@LASTCHEMISTRY'$ @  No results found for: LABCA2  No components found for: [id number]  No results for input(s): INR in the last 168 hours.  No results found for: LABCA2  No results found for: OAC166  No results found for: AYT016  No results found for: WFU932  Lab Results  Component Value Date   CA2729 25.6 04/26/2018    No components found for: HGQUANT  No results found for: CEA1 / No results found for: CEA1   No results found for: [facility]  No results found for: CHROMOGRNA  No results found for: PSA1  Appointment on 02/17/2019  Component Date Value Ref Range Status  . Sodium 02/17/2019 138  135 - 145 mmol/L Final  . Potassium 02/17/2019 4.5  3.5 - 5.1 mmol/L Final  . Chloride 02/17/2019 101  98 - 111 mmol/L Final  . CO2 02/17/2019 26  22 - 32 mmol/L Final  . Glucose, Bld 02/17/2019 449* 70 - 99 mg/dL Final  . BUN 02/17/2019 26* 8 - 23 mg/dL Final  . Creatinine, Ser 02/17/2019 2.08* 0.44 - 1.00 mg/dL Final  . Calcium 02/17/2019 8.5* 8.9 - 10.3 mg/dL Final  . Total Protein 02/17/2019 6.8  6.5 - 8.1 g/dL Final  . Albumin 02/17/2019 3.4*  3.5 - 5.0 g/dL Final  . AST 02/17/2019 29  15 - 41 U/L Final  . ALT 02/17/2019 14  0 - 44 U/L Final  . Alkaline Phosphatase 02/17/2019 115  38 - 126 U/L Final  . Total Bilirubin 02/17/2019 0.6  0.3 - 1.2 mg/dL Final  . GFR calc non Af Amer 02/17/2019 21* >60 mL/min Final  . GFR calc Af Amer 02/17/2019 25* >60 mL/min Final  . Anion gap 02/17/2019 11  5 - 15 Final   Performed at [facility], [place] [address] [place], [place] [address]  . WBC 02/17/2019 3.0* 4.0 - 10.5 K/uL Final  . RBC 02/17/2019 3.19* 3.87 - 5.11 MIL/uL Final  . Hemoglobin 02/17/2019 12.1  12.0 - 15.0 g/dL Final  . HCT 02/17/2019 36.7  36.0 - 46.0 % Final  . MCV 02/17/2019 115.0* 80.0 - 100.0 fL Final  . MCH 02/17/2019 37.9* 26.0 - 34.0 pg Final  . MCHC 02/17/2019 33.0  30.0 - 36.0 g/dL Final  . RDW 02/17/2019 13.7  11.5 - 15.5 % Final  . Platelets 02/17/2019 84* 150 - 400 K/uL Final  . nRBC 02/17/2019 0.0  0.0 - 0.2 % Final  . Neutrophils Relative % 02/17/2019 50  % Final  . Neutro Abs 02/17/2019 1.5* 1.7 - 7.7 K/uL Final  . Lymphocytes Relative 02/17/2019 37  % Final  . Lymphs Abs 02/17/2019 1.1  0.7 - 4.0 K/uL Final  . Monocytes Relative 02/17/2019 7  % Final  . Monocytes Absolute 02/17/2019 0.2  0.1 - 1.0 K/uL Final  . Eosinophils Relative 02/17/2019 4  % Final  . Eosinophils Absolute 02/17/2019 0.1  0.0 - 0.5 K/uL Final  . Basophils Relative 02/17/2019 2  % Final  . Basophils Absolute 02/17/2019 0.1  0.0 - 0.1 K/uL Final  . Immature Granulocytes 02/17/2019 0  % Final  . Abs Immature Granulocytes 02/17/2019 0.00  0.00 - 0.07 K/uL Final  . Polychromasia 02/17/2019 PRESENT   Final  . Ovalocytes 02/17/2019 PRESENT   Final   Performed at [facility], [place] [place], [place] [address]    (this displays the last labs from the last 3 days)  No results found for: TOTALPROTELP, ALBUMINELP, A1GS, A2GS, BETS, BETA2SER, GAMS, MSPIKE, SPEI (this displays SPEP  labs)  No results found for: KPAFRELGTCHN, LAMBDASER, KAPLAMBRATIO (kappa/lambda light chains)  No results found for: HGBA, HGBA2QUANT, HGBFQUANT, HGBSQUAN (Hemoglobinopathy evaluation)   No results found for: LDH  No results found for: IRON, TIBC, IRONPCTSAT (Iron and TIBC)  No results found for: FERRITIN  Urinalysis    Component Value Date/Time   COLORURINE YELLOW 11/19/2018 [place] 11/19/2018 1455   LABSPEC 1.009 11/19/2018 1455   PHURINE 6.0 11/19/2018  1455   GLUCOSEU >=500 (A) 11/19/2018 1455   HGBUR NEGATIVE 11/19/2018 1455   BILIRUBINUR NEGATIVE 11/19/2018 1455   KETONESUR NEGATIVE 11/19/2018 1455   PROTEINUR NEGATIVE 11/19/2018 1455   UROBILINOGEN 1.0 12/11/2014 1106   NITRITE NEGATIVE 11/19/2018 1455   LEUKOCYTESUR SMALL (A) 11/19/2018 1455     STUDIES: DG Ribs Bilateral  Result Date: 01/21/2019 CLINICAL DATA:  Back pain, history of compression fracture and breast cancer EXAM: BILATERAL RIBS - 3+ VIEW COMPARISON:  CT October 26, 2018 FINDINGS: No visible displaced rib fracture or suspicious osseous lesion involving the ribs. Degenerative changes are present in the imaged spine and shoulders. The lungs are clear. The aorta is calcified. The remaining cardiomediastinal contours are unremarkable. Postsurgical changes noted in the left breast and axilla. Prior lumbar spinal fusion is partially included in the level of imaging. IMPRESSION: No visible displaced rib fracture or suspicious osseous lesion involving the ribs. Aortic Atherosclerosis (ICD10-I70.0). Electronically Signed   By: Lovena Le M.D.   On: 01/21/2019 03:09   DG Cervical Spine 2-3 Views  Result Date: 01/21/2019 CLINICAL DATA:  Back pain, history of breast cancer EXAM: CERVICAL SPINE - 2-3 VIEW COMPARISON:  None. FINDINGS: Suboptimal visualization of C6 and C7 despite the use of swimmer's views. No acute fracture or suspicious osseous lesions. Anterolisthesis of C4 on C5 on a likely  degenerative basis with facet hypertrophic changes at this level. Endplate spurring noted at C5-6. Bilateral hearing aids are noted. IMPRESSION: 1. Suboptimal visualization of C6 and C7 despite the use of swimmer's views. 2. No acute osseous abnormality. 3. Multilevel cervical spondylitic changes, maximal at C5-6. 4. Degenerative anterolisthesis C4 on C5. Electronically Signed   By: Lovena Le M.D.   On: 01/21/2019 03:03   DG Thoracic Spine 2 View  Result Date: 01/21/2019 CLINICAL DATA:  Back pain, history of compression fracture, breast cancer EXAM: THORACIC SPINE 2 VIEWS COMPARISON:  CT chest, abdomen and pelvis October 26, 2018 FINDINGS: Stable anterior wedging of T11, T12 and L1. L2-S1 posterior spinal fusion is partially imaged on this exam. Straightening of the normal thoracic kyphosis is similar to comparison. Multilevel discogenic endplate changes are present. No acute vertebral body fracture or suspicious osseous lesion. Included lungs are clear. The aorta is calcified. The remaining cardiomediastinal contours are unremarkable. Surgical clips noted in the upper abdomen. IMPRESSION: 1. Unchanged anterior wedging of T11, T12 and L1. 2. No acute osseous abnormality. 3. Similar multilevel degenerative changes of the thoracolumbar spine. Electronically Signed   By: Lovena Le M.D.   On: 01/21/2019 03:05   DG Lumbar Spine 2-3 Views  Result Date: 01/21/2019 CLINICAL DATA:  Back pain, history of compression fracture, breast cancer EXAM: LUMBAR SPINE - 2-3 VIEW COMPARISON:  CT chest, abdomen and pelvis October 26, 2018 FINDINGS: Stable appearance of compression deformities at T11, T12, L1 and L4. Prior L2-S1 posterior spinal fusion with interbody spacer placement is in similar alignment to comparison CT. No evidence of acute hardware complication. No acute or suspicious osseous lesions. The osseous structures appear diffusely demineralized which may limit detection of small or nondisplaced fractures.  Multilevel discogenic and facet degenerative changes are present at and beyond the fused levels. Additional degenerative changes noted in the pelvis and hips. IMPRESSION: 1. Unchanged appearance of compression deformities at T11, T12, L1 and L4. 2. Prior L2-S1 posterior spinal fusion without evidence of hardware complication. 3. Multilevel degenerative changes, similar to prior. 4. No acute or suspicious osseous lesions. Electronically Signed   By: March Rummage  [facility] M.D.   On: 01/21/2019 03:08     ELIGIBLE FOR AVAILABLE RESEARCH PROTOCOL: no   ASSESSMENT: 85 y.o. [place] woman status post left lumpectomy and sentinel lymph node sampling 03/12/2012 for a  pT1b pN0, stage IA invasive ductal carcinoma, grade 3, strongly estrogen and progesterone receptor negative, HER-2 nonamplified, with an MIB-1 of 15%  (a) additional surgery for margin clearance was successful 03/22/2012  (1) the patient opted against adjuvant radiation  (2) anastrozole started March 2014, discontinued 04/03/2018 with evidence of recurrence  RECURRENT DISEASE: February 2020 (3) status post left breast retroareolar biopsy 03/05/2018 for a clinical T1c N1, stage IIA invasive ductal carcinoma, estrogen and progesterone receptor positive, HER-2 not amplified, with an MIB-1 of 20%.  (4) spread to skin/chest wall on left noted 04/03/2018  (a) chest CT 04/11/2018 scan showed nonspecific bilateral axillary adenopathy   (b) bone scan 04/11/2018 shows only degenerative disease  (c) CA 27-29 not informative (was 25.6 on 04/26/2018)  (d) biopsy of 2 left breast skin nodules 04/04/2018 shows invasive carcinoma, estrogen receptor 100% positive, progesterone receptor and HER-2 negative, with an MIB-1 of 30%.  (5) started fulvestrant 04/12/2018  (a) added palbociclib 04/26/2018 at 125 mg p.o. daily, 21 days on, 7 days off   (1) dose decreased to 75 mg daily, 21/03 September 2018   (2) dose decreased to 75 mg every other day, 21/7 with October 2020  cycle  (b) tamoxifen added and fulvestrant changed to every 8 weeks after 08/13/2018 dose to minimize exposure during the pandemic, discontinued when monthly fulvestrant resumed  (c) fulvestrant resumed at every 4 weeks beginning with 11/25/2018 dose  (d) fulvestrant and palbociclib discontinued 02/17/2019 with evidence of chest wall progression  (6) CT of the head, chest, abdomen, and pelvis August/September 2020 shows no evidence of metastatic disease outside of the left breast  (a) plain films of the cervical thoracic lumbar spine and ribs shows no obvious bone lesions  PLAN: [name] tolerated fulvestrant and palbociclib generally well although we did have to drop the fulvestrant dose because of counts.  Her counts today incidentally are acceptable.  The problem is that this combination really did not work for her.  The cancer continues to grow even if very slowly.  Accordingly we are going off fulvestrant and palbociclib.  She only has 2 more palbociclib tablets on hand and she will take those.  She will not get her fulvestrant today.  Instead she will start capecitabine 03/02/2018.  I have put in the prescription today and today we discussed the possible toxicities side effects and complications in detail.  We are going to start at 1000 mg twice daily for 14 days on 7 days off.  If she tolerates that well we will increase the dose as tolerated until we get to 1500 mg twice daily which is the goal.  I would like to try this in a minimum of 4 months before deciding whether it is going to work or not.  She has a good understanding of the overall plan as does her daughter.  They are in agreement with it.  They know to call for any other issue that may develop before the next visit here.    Chauncey Cruel, MD Medical Oncology and Hematology [facility] [place], [place] [address] Tel. [phone]    Fax. [phone]   I, [name], am acting as  scribe for Dr. Virgie Dad. Jace Dowe.  Lindie Spruce MD, have reviewed the  above documentation for accuracy and completeness, and I agree with the above.

## 2019-02-17 ENCOUNTER — Other Ambulatory Visit: Payer: Medicare HMO

## 2019-02-17 ENCOUNTER — Inpatient Hospital Stay: Payer: Medicare HMO | Attending: Oncology

## 2019-02-17 ENCOUNTER — Encounter: Payer: Self-pay | Admitting: Oncology

## 2019-02-17 ENCOUNTER — Other Ambulatory Visit: Payer: Self-pay

## 2019-02-17 ENCOUNTER — Ambulatory Visit: Payer: Medicare HMO | Admitting: Oncology

## 2019-02-17 ENCOUNTER — Ambulatory Visit: Payer: Medicare HMO

## 2019-02-17 ENCOUNTER — Inpatient Hospital Stay: Payer: Medicare HMO | Admitting: Oncology

## 2019-02-17 ENCOUNTER — Inpatient Hospital Stay: Payer: Medicare HMO

## 2019-02-17 VITALS — BP 126/64 | HR 74 | Temp 98.5°F | Resp 18 | Ht 60.0 in | Wt 200.7 lb

## 2019-02-17 DIAGNOSIS — C50812 Malignant neoplasm of overlapping sites of left female breast: Secondary | ICD-10-CM

## 2019-02-17 DIAGNOSIS — M129 Arthropathy, unspecified: Secondary | ICD-10-CM | POA: Insufficient documentation

## 2019-02-17 DIAGNOSIS — Z85828 Personal history of other malignant neoplasm of skin: Secondary | ICD-10-CM | POA: Diagnosis not present

## 2019-02-17 DIAGNOSIS — Z923 Personal history of irradiation: Secondary | ICD-10-CM | POA: Diagnosis not present

## 2019-02-17 DIAGNOSIS — E119 Type 2 diabetes mellitus without complications: Secondary | ICD-10-CM | POA: Diagnosis not present

## 2019-02-17 DIAGNOSIS — Z170 Estrogen receptor positive status [ER+]: Secondary | ICD-10-CM

## 2019-02-17 DIAGNOSIS — N189 Chronic kidney disease, unspecified: Secondary | ICD-10-CM | POA: Diagnosis not present

## 2019-02-17 DIAGNOSIS — Z79899 Other long term (current) drug therapy: Secondary | ICD-10-CM | POA: Insufficient documentation

## 2019-02-17 DIAGNOSIS — K219 Gastro-esophageal reflux disease without esophagitis: Secondary | ICD-10-CM | POA: Insufficient documentation

## 2019-02-17 DIAGNOSIS — C50312 Malignant neoplasm of lower-inner quadrant of left female breast: Secondary | ICD-10-CM

## 2019-02-17 DIAGNOSIS — I129 Hypertensive chronic kidney disease with stage 1 through stage 4 chronic kidney disease, or unspecified chronic kidney disease: Secondary | ICD-10-CM | POA: Insufficient documentation

## 2019-02-17 DIAGNOSIS — C50012 Malignant neoplasm of nipple and areola, left female breast: Secondary | ICD-10-CM | POA: Insufficient documentation

## 2019-02-17 DIAGNOSIS — Z87442 Personal history of urinary calculi: Secondary | ICD-10-CM | POA: Diagnosis not present

## 2019-02-17 DIAGNOSIS — M8580 Other specified disorders of bone density and structure, unspecified site: Secondary | ICD-10-CM

## 2019-02-17 DIAGNOSIS — IMO0002 Reserved for concepts with insufficient information to code with codable children: Secondary | ICD-10-CM

## 2019-02-17 DIAGNOSIS — E1140 Type 2 diabetes mellitus with diabetic neuropathy, unspecified: Secondary | ICD-10-CM

## 2019-02-17 DIAGNOSIS — C50912 Malignant neoplasm of unspecified site of left female breast: Secondary | ICD-10-CM

## 2019-02-17 LAB — CBC WITH DIFFERENTIAL/PLATELET
Abs Immature Granulocytes: 0 10*3/uL (ref 0.00–0.07)
Basophils Absolute: 0.1 10*3/uL (ref 0.0–0.1)
Basophils Relative: 2 %
Eosinophils Absolute: 0.1 10*3/uL (ref 0.0–0.5)
Eosinophils Relative: 4 %
HCT: 36.7 % (ref 36.0–46.0)
Hemoglobin: 12.1 g/dL (ref 12.0–15.0)
Immature Granulocytes: 0 %
Lymphocytes Relative: 37 %
Lymphs Abs: 1.1 10*3/uL (ref 0.7–4.0)
MCH: 37.9 pg — ABNORMAL HIGH (ref 26.0–34.0)
MCHC: 33 g/dL (ref 30.0–36.0)
MCV: 115 fL — ABNORMAL HIGH (ref 80.0–100.0)
Monocytes Absolute: 0.2 10*3/uL (ref 0.1–1.0)
Monocytes Relative: 7 %
Neutro Abs: 1.5 10*3/uL — ABNORMAL LOW (ref 1.7–7.7)
Neutrophils Relative %: 50 %
Platelets: 84 10*3/uL — ABNORMAL LOW (ref 150–400)
RBC: 3.19 MIL/uL — ABNORMAL LOW (ref 3.87–5.11)
RDW: 13.7 % (ref 11.5–15.5)
WBC: 3 10*3/uL — ABNORMAL LOW (ref 4.0–10.5)
nRBC: 0 % (ref 0.0–0.2)

## 2019-02-17 LAB — COMPREHENSIVE METABOLIC PANEL
ALT: 14 U/L (ref 0–44)
AST: 29 U/L (ref 15–41)
Albumin: 3.4 g/dL — ABNORMAL LOW (ref 3.5–5.0)
Alkaline Phosphatase: 115 U/L (ref 38–126)
Anion gap: 11 (ref 5–15)
BUN: 26 mg/dL — ABNORMAL HIGH (ref 8–23)
CO2: 26 mmol/L (ref 22–32)
Calcium: 8.5 mg/dL — ABNORMAL LOW (ref 8.9–10.3)
Chloride: 101 mmol/L (ref 98–111)
Creatinine, Ser: 2.08 mg/dL — ABNORMAL HIGH (ref 0.44–1.00)
GFR calc Af Amer: 25 mL/min — ABNORMAL LOW (ref >60)
GFR calc non Af Amer: 21 mL/min — ABNORMAL LOW (ref >60)
Glucose, Bld: 449 mg/dL — ABNORMAL HIGH (ref 70–99)
Potassium: 4.5 mmol/L (ref 3.5–5.1)
Sodium: 138 mmol/L (ref 135–145)
Total Bilirubin: 0.6 mg/dL (ref 0.3–1.2)
Total Protein: 6.8 g/dL (ref 6.5–8.1)

## 2019-02-17 MED ORDER — CAPECITABINE 500 MG PO TABS: ORAL_TABLET | ORAL | 6 refills | Status: DC

## 2019-02-18 ENCOUNTER — Telehealth: Payer: Self-pay | Admitting: Oncology

## 2019-02-18 ENCOUNTER — Telehealth: Payer: Self-pay

## 2019-02-18 ENCOUNTER — Telehealth: Payer: Self-pay | Admitting: Pharmacist

## 2019-02-18 DIAGNOSIS — C50812 Malignant neoplasm of overlapping sites of left female breast: Secondary | ICD-10-CM

## 2019-02-18 DIAGNOSIS — Z170 Estrogen receptor positive status [ER+]: Secondary | ICD-10-CM

## 2019-02-18 NOTE — Telephone Encounter (Signed)
I talk with patient regarding schedule  

## 2019-02-18 NOTE — Telephone Encounter (Signed)
Oral Oncology Patient Advocate Encounter  Prior Authorization for Xeloda has been approved.    PA# [id number] Effective dates: 02/18/19 through 02/18/20  Patients co-pay is $0  Oral Oncology Clinic will continue to follow.   [facility] Patient [place] Phone [phone] Fax [phone] 02/18/2019 12:27 PM

## 2019-02-18 NOTE — Telephone Encounter (Signed)
Oral Oncology Pharmacist Encounter  Received new prescription for Xeloda (capecitabine) for the treatment of recurrent breast cancer, planned duration until disease progression or unacceptable drug toxicity. Planned start 03/03/2019  CMP from 02/17/2019 assessed, SCr elevated. Patient's CrCl 20.6 mL/min, based on this the recommendation is to reduce the dose by 25%. [name] dose is reduced appriorately. Prescription dose and frequency assessed. The plan is to start at 1000 mg twice daily for 14 days on 7 days off.  If she tolerates that well we will increase the dose as tolerated until we get to 1500 mg twice daily   Current medication list in Epic reviewed, one DDIs with capecitabine identified: - Omeprazole: Proton Pump Inhibitors (PPI) may diminish the therapeutic effect of capecitabine. Recommend evaluating the need for a PPI/acid suppression. If acid suppression is needed, recommend switching to a H2 antagonist (eg, famotidine) to avoid this DDI.   Prescription has been e-scribed to the [facility] for benefits analysis and approval.  Oral Oncology Clinic will continue to follow for insurance authorization, copayment issues, initial counseling and start date.  Darl Pikes, PharmD, BCPS, [facility] Hematology/Oncology Clinical Pharmacist [facility] [phone]  02/18/2019 3:18 PM

## 2019-02-18 NOTE — Telephone Encounter (Signed)
Oral Oncology Patient Advocate Encounter  Received notification from [facility] that prior authorization for Xeloda is required.  PA submitted on CoverMyMeds Key [id number] Status is pending  Oral Oncology Clinic will continue to follow.   [place] [facility] [place] Phone [phone] Fax [phone] 02/18/2019 12:27 PM

## 2019-02-25 MED ORDER — CAPECITABINE 500 MG PO TABS: 1000.0000 mg | ORAL_TABLET | Freq: Two times a day (BID) | ORAL | 6 refills | Status: DC

## 2019-02-25 NOTE — Telephone Encounter (Signed)
Oral Chemotherapy Pharmacist Encounter  Patient's daughter [name] plans on picking up her medication from [place] tomorrow 02/26/2019. She knows that the plan is for her mom to start her Xeloda on Monday 03/02/2018.  Patient Education I spoke with patient for overview of new oral chemotherapy medication: Xeloda (capecitabine) for the treatment of recurrent breast cancer, planned duration until disease progression or unacceptable drug toxicity. Planned start 03/03/2019.   Pt is doing well. Counseled patient on administration, dosing, side effects, monitoring, drug-food interactions, safe handling, storage, and disposal. Patient will take 2 tablets (1000 mg) by mouth 2 times daily after meals, for 14 days, then stop for 7 days.  Discussed DDI with omeprazole (DDI detailed in previous note). The plan is so Ms Dinh to slowly taper off the omeprazole starting with taking it every other day then further tapering as tolerated. Reviewed famotidine as an alternative for acid reflux and calcium carbonate for any break through nausea.   Side effects include but not limited to: hand-foot syndrome, N/V, diarrhea, decreased wbc, fatigue.    Reviewed with patient importance of keeping a medication schedule and plan for any missed doses.  Ms. Werst voiced understanding and appreciation. All questions answered. Medication handout provided and consent obtained. Offered to make patient calendar for her Xeloda but [name] said she had a calendar she uses for the medication   Provided patient with [facility] phone number. Patient knows to call the office with questions or concerns. Oral Chemotherapy Navigation Clinic will continue to follow.  Darl Pikes, PharmD, BCPS, [facility] Hematology/Oncology Clinical Pharmacist [facility] [phone]  02/25/2019 3:32 PM

## 2019-02-26 DIAGNOSIS — R69 Illness, unspecified: Secondary | ICD-10-CM | POA: Diagnosis not present

## 2019-02-26 MED FILL — CAPECITABINE 500 MG TABS: 500 | 21 days supply | Qty: 56 | Fill #0

## 2019-03-05 ENCOUNTER — Encounter: Payer: Self-pay | Admitting: Podiatry

## 2019-03-05 ENCOUNTER — Ambulatory Visit: Payer: Medicare HMO | Admitting: Podiatry

## 2019-03-05 ENCOUNTER — Other Ambulatory Visit: Payer: Self-pay

## 2019-03-05 DIAGNOSIS — E1142 Type 2 diabetes mellitus with diabetic polyneuropathy: Secondary | ICD-10-CM

## 2019-03-05 DIAGNOSIS — M2041 Other hammer toe(s) (acquired), right foot: Secondary | ICD-10-CM | POA: Diagnosis not present

## 2019-03-05 DIAGNOSIS — M2042 Other hammer toe(s) (acquired), left foot: Secondary | ICD-10-CM

## 2019-03-05 DIAGNOSIS — B351 Tinea unguium: Secondary | ICD-10-CM | POA: Diagnosis not present

## 2019-03-05 DIAGNOSIS — E119 Type 2 diabetes mellitus without complications: Secondary | ICD-10-CM

## 2019-03-05 DIAGNOSIS — M79676 Pain in unspecified toe(s): Secondary | ICD-10-CM

## 2019-03-05 NOTE — Patient Instructions (Signed)
Diabetes Mellitus and Foot Care Foot care is an important part of your health, especially when you have diabetes. Diabetes may cause you to have problems because of poor blood flow (circulation) to your feet and legs, which can cause your skin to:  Become thinner and drier.  Break more easily.  Heal more slowly.  Peel and crack. You may also have nerve damage (neuropathy) in your legs and feet, causing decreased feeling in them. This means that you may not notice minor injuries to your feet that could lead to more serious problems. Noticing and addressing any potential problems early is the best way to prevent future foot problems. How to care for your feet Foot hygiene  Wash your feet daily with warm water and mild soap. Do not use hot water. Then, pat your feet and the areas between your toes until they are completely dry. Do not soak your feet as this can dry your skin.  Trim your toenails straight across. Do not dig under them or around the cuticle. File the edges of your nails with an emery board or nail file.  Apply a moisturizing lotion or petroleum jelly to the skin on your feet and to dry, brittle toenails. Use lotion that does not contain alcohol and is unscented. Do not apply lotion between your toes. Shoes and socks  Wear clean socks or stockings every day. Make sure they are not too tight. Do not wear knee-high stockings since they may decrease blood flow to your legs.  Wear shoes that fit properly and have enough cushioning. Always look in your shoes before you put them on to be sure there are no objects inside.  To break in new shoes, wear them for just a few hours a day. This prevents injuries on your feet. Wounds, scrapes, corns, and calluses  Check your feet daily for blisters, cuts, bruises, sores, and redness. If you cannot see the bottom of your feet, use a mirror or ask someone for help.  Do not cut corns or calluses or try to remove them with medicine.  If you  find a minor scrape, cut, or break in the skin on your feet, keep it and the skin around it clean and dry. You may clean these areas with mild soap and water. Do not clean the area with peroxide, alcohol, or iodine.  If you have a wound, scrape, corn, or callus on your foot, look at it several times a day to make sure it is healing and not infected. Check for: ? Redness, swelling, or pain. ? Fluid or blood. ? Warmth. ? Pus or a bad smell. General instructions  Do not cross your legs. This may decrease blood flow to your feet.  Do not use heating pads or hot water bottles on your feet. They may burn your skin. If you have lost feeling in your feet or legs, you may not know this is happening until it is too late.  Protect your feet from hot and cold by wearing shoes, such as at the beach or on hot pavement.  Schedule a complete foot exam at least once a year (annually) or more often if you have foot problems. If you have foot problems, report any cuts, sores, or bruises to your health care provider immediately. Contact a health care provider if:  You have a medical condition that increases your risk of infection and you have any cuts, sores, or bruises on your feet.  You have an injury that is not   healing.  You have redness on your legs or feet.  You feel burning or tingling in your legs or feet.  You have pain or cramps in your legs and feet.  Your legs or feet are numb.  Your feet always feel cold.  You have pain around a toenail. Get help right away if:  You have a wound, scrape, corn, or callus on your foot and: ? You have pain, swelling, or redness that gets worse. ? You have fluid or blood coming from the wound, scrape, corn, or callus. ? Your wound, scrape, corn, or callus feels warm to the touch. ? You have pus or a bad smell coming from the wound, scrape, corn, or callus. ? You have a fever. ? You have a red line going up your leg. Summary  Check your feet every day  for cuts, sores, red spots, swelling, and blisters.  Moisturize feet and legs daily.  Wear shoes that fit properly and have enough cushioning.  If you have foot problems, report any cuts, sores, or bruises to your health care provider immediately.  Schedule a complete foot exam at least once a year (annually) or more often if you have foot problems. This information is not intended to replace advice given to you by your health care provider. Make sure you discuss any questions you have with your health care provider. Document Revised: 11/06/2018 Document Reviewed: 03/17/2016 Elsevier Patient Education  2020 [employer]  

## 2019-03-09 NOTE — Progress Notes (Signed)
Subjective: [name] presents with diabetes, diabetic neuropathy and cc of painful, discolored, thick toenails which interfere with activities of daily living. Pain is aggravated when wearing enclosed shoe gear. Pain is relieved with periodic professional debridement.  Sinda Du, MD is her PCP. Last visit was 01/09/2019.  Medications reviewed in chart.  Allergies  Allergen Reactions  . Bee Venom Anaphylaxis and Swelling  . Hydromorphone Hcl Other (See Comments)    Agitation, Confusion  . Demerol Nausea Only   Objective: There were no vitals filed for this visit.  Vascular Examination: Capillary refill time to digits immediate b/l.  Dorsalis pedis pulses palpable b/l.  Posterior tibial pulses palpable b/l.  Digital hair absent b/l.  Skin temperature gradient WNL b/l.  Bilateral lower extremity edema. No breaks in skin. No warmth. No pain with calf compression.  Dermatological Examination: Skin with normal turgor, texture and tone b/l.  Toenails 1-5 b/l discolored, thick, dystrophic with subungual debris and pain with palpation to nailbeds due to thickness of nails.  Musculoskeletal: Muscle strength 5/5 to all LE muscle groups.  Hammertoe lesser digits b/l.   No pain, crepitus or joint limitation with passive/active ROM.  Neurological: Sensation diminished with 10 gram monofilament bilaterally.  Vibratory sensation diminished bilaterally.  Assessment: 1. Painful onychomycosis toenails 1-5 b/l 2. Hammertoes b/l 3. Encounter for diabetic foot exam 4. NIDDM with Diabetic neuropathy  Plan: 1. Continue diabetic foot care principles. Literature dispensed on today. 2. Toenails 1-5 b/l were debrided in length and girth without iatrogenic bleeding. 3. Patient to continue soft, supportive shoe gear. 4. Patient to report any pedal injuries to medical professional. 5. Follow up 3 months.  6. Patient/POA to call should there be a concern in the interim.

## 2019-03-13 DIAGNOSIS — H4010X0 Unspecified open-angle glaucoma, stage unspecified: Secondary | ICD-10-CM | POA: Diagnosis not present

## 2019-03-13 DIAGNOSIS — E785 Hyperlipidemia, unspecified: Secondary | ICD-10-CM | POA: Diagnosis not present

## 2019-03-13 DIAGNOSIS — E1165 Type 2 diabetes mellitus with hyperglycemia: Secondary | ICD-10-CM | POA: Diagnosis not present

## 2019-03-13 DIAGNOSIS — I10 Essential (primary) hypertension: Secondary | ICD-10-CM | POA: Diagnosis not present

## 2019-03-13 DIAGNOSIS — G130 Paraneoplastic neuromyopathy and neuropathy: Secondary | ICD-10-CM | POA: Diagnosis not present

## 2019-03-13 DIAGNOSIS — C50919 Malignant neoplasm of unspecified site of unspecified female breast: Secondary | ICD-10-CM | POA: Diagnosis not present

## 2019-03-13 DIAGNOSIS — K219 Gastro-esophageal reflux disease without esophagitis: Secondary | ICD-10-CM | POA: Diagnosis not present

## 2019-03-13 DIAGNOSIS — I2510 Atherosclerotic heart disease of native coronary artery without angina pectoris: Secondary | ICD-10-CM | POA: Diagnosis not present

## 2019-03-13 DIAGNOSIS — D519 Vitamin B12 deficiency anemia, unspecified: Secondary | ICD-10-CM | POA: Diagnosis not present

## 2019-03-13 DIAGNOSIS — G4700 Insomnia, unspecified: Secondary | ICD-10-CM | POA: Diagnosis not present

## 2019-03-17 NOTE — Progress Notes (Signed)
[name]  [phone] [phone]    ID: [name] DOB: [birth year]  MR#: [id number]  MLJ#:[id number]  Patient Care Team: Celene Squibb, MD as PCP - General (Internal Medicine) Gala Romney Cristopher Estimable, MD as Consulting Physician (Gastroenterology) Blen Ransome, Virgie Dad, MD as Consulting Physician (Oncology) Jovita Kussmaul, MD as Consulting Physician (General Surgery) Gery Pray, MD as Consulting Physician (Radiation Oncology) Erline Levine, MD as Consulting Physician (Neurosurgery) Chauncey Cruel, MD OTHER MD:   CHIEF COMPLAINT: Recurrent estrogen receptor positive breast cancer  CURRENT TREATMENT: capecitabine 03/02/2018   INTERVAL HISTORY: [name] returns today for follow-up and treatment of her recurrent estrogen receptor positive breast cancer accompanied by her daughter.   She started on capecitabine on 03/03/2019.  She started at 1000 mg twice daily.  She takes the afternoon dose about 1 or 2 PM.  He says that her bowel movements have normalized--before she had to take MiraLAX daily and now she does not.  She has not had diarrhea however.  Since her last visit, she has not undergone any additional studies. Her most recent mammogram was performed on 02/26/2018, when her disease recurrence was first seen.   REVIEW OF SYSTEMS: [name] had minimal angular cheilitis which she resolved by taking lysine.  She is also on acyclovir for her history of mouth sores.  She has an area on her chest wall that is ulcerating and causing a minimal bloody discharge.  She tells me she has established herself with Dr. Nevada Crane and is very pleased with his office.  She has not yet been able to assess get an appointment for the COVID-19 vaccine.  Holidays were quiet but she did get to see her great grandchildren.  Her daughter tells me on the side that she thinks her mother is having some cognitive deficits.  This is not particularly marked however.  A detailed review of  systems today was otherwise stable   HISTORY OF CURRENT ILLNESS: From the original intake note  [name] has a history of left breast invasive ductal carcinoma,  status post left lumpectomy and sentinel lymph node sampling 03/12/2012 for a pT1b pN0, stage IA invasive ductal carcinoma, grade 3, strongly estrogen and progesterone receptor positive, HER-2 nonamplified, with an MIB-1-1 of 15%.  The single sentinel lymph node was negative.  There was a positive margin.  She had reexcision on 03/22/2012 for what proved to be a single residual focus of intermediate grade ductal carcinoma in situ.  This was close to the lateral inked margin which was however negative.  She saw radiation oncology (Drs. Orlene Erm and Valere Dross) who felt adjuvant endocrine therapy alone would be adequate. She started an[facility] under [facility], on which she continues.  More recently she underwent routine bilateral diagnostic mammography with tomography and left breast ultrasonography at [facility] on 02/26/2018 showing: highly suspicious 1.6 cm retroareolar left breast mass, and single abnormal left axillary lymph node.  Accordingly on 03/05/2018 she proceeded to biopsy of the left breast area in question. The pathology ([id number]) from this procedure showed: invasive ductal carcinoma, grade 3; ductal carcinoma in the lymph node tissue. Prognostic indicators significant for: estrogen receptor, 95% positive and progesterone receptor, 5% positive, both with strong staining intensity. Proliferation marker Ki67 at 20%. HER2 negative by immunohistochemistry, 1+.  The patient's subsequent history is as detailed below.   PAST MEDICAL HISTORY: Past Medical History:  Diagnosis Date  . Abrasion of arm, left    secondary to  fall   . Anemia   . Arthritis   . Breast cancer, left ([place])    er/pr +  . Cataract    no surgery as yet,starting of 3 years  . Chronic kidney disease    renal stones, one  episode of Lithotripsy  . Cirrhosis (HCC)    Metavir score 4  . Complication of anesthesia   . Diabetes mellitus   . GERD (gastroesophageal reflux disease)   . Hyperlipidemia   . Hypertension    followed by [facility]. relative to  urology study grp.  Doesn't report ever having a stress test   . Mental disorder   . Multiple falls   . Nocturia   . PONV (postoperative nausea and vomiting)   . Seasonal allergies   . Skin cancer    face- melanoma, 2012- surg. excision      PAST SURGICAL HISTORY: Past Surgical History:  Procedure Laterality Date  . ABDOMINAL HYSTERECTOMY    . BACK SURGERY     x4 back surgery to include rods & screws  . Last in May 2014  . BREAST LUMPECTOMY WITH NEEDLE LOCALIZATION AND AXILLARY SENTINEL LYMPH NODE BX  03/12/2012   Procedure: BREAST LUMPECTOMY WITH NEEDLE LOCALIZATION AND AXILLARY SENTINEL LYMPH NODE BX;  Surgeon: Merrie Roof, MD;  Location: [facility];  Service: General;  Laterality: Left;  . BREAST SURGERY     lumpectomy x2/left   . CHOLECYSTECTOMY    . COLONOSCOPY N/A 11/20/2013   Dr.Rourk- diverticula was found in the left colon. o/w normal rectal, colonic and terminal ileal mucosa  . ESOPHAGOGASTRODUODENOSCOPY N/A 11/20/2013   Dr.Rourk- abnormal mucosa was found. diffuse snake skinning and friability of the gastric mucosa. patent pylorus. abnormal-appearing first, second and third portion of the duodenum. 57m gastric nodule at the GE junction. stomach bx= minimal chronic infammation, GE junction bx= polypoid fragments of gastric cardia type mucosa w/mod chronic inflammation and foveolar hyperplasia  . EYE SURGERY     cataract surgery bilat   . FRACTURE SURGERY     1975- ORIF- L ankle   . GIVENS CAPSULE STUDY N/A 02/24/2014   Procedure: GIVENS CAPSULE STUDY;  Surgeon: RDaneil Dolin MD;  [facility];  Service: Endoscopy;  Laterality: N/A;  . RE-EXCISION OF BREAST LUMPECTOMY  03/22/2012   Procedure: RE-EXCISION OF BREAST LUMPECTOMY;   Surgeon: PMerrie Roof MD;  Location: [facility]  Service: General;  Laterality: Left;  re-excision left breast lumpectomy cavity  ER+, PR+  . ROTATOR CUFF REPAIR Bilateral   . SHOULDER SURGERY    . SKIN BIOPSY    . TOTAL KNEE ARTHROPLASTY Right 12/18/2014   Procedure: RIGHT TOTAL KNEE ARTHROPLASTY;  Surgeon: RSydnee Cabal MD;  Location: [facility];  Service: Orthopedics;  Laterality: Right;  . TUBAL LIGATION      FAMILY HISTORY: Family History  Problem Relation Age of Onset  . Dementia Mother   . Heart disease Father   . Emphysema Father   . Cancer Brother        metastatic, unsure primary  . Stroke Sister   . CAD Brother   . Colon cancer Neg Hx   . Prostate cancer Neg Hx    Patient father was 642years old when he died from "heart trouble" and emphysema. Patient mother died from mini-stroke complications at age 84 She denies a family hx of breast or ovarian cancer, prostate or pancreatic cancers. She has 3 siblings. She had a twin  brother who died at age 66 from metastatic cancer. She had 2 sisters. Her only living sibling is a sister.   GYNECOLOGIC HISTORY:  No LMP recorded. Patient has had a hysterectomy. Menarche: 86 years old Age at first live birth: 86 years old [place] P 2 LMP s/p hysterectomy Contraceptive no HRT yes, for more than 5 years  Hysterectomy? Yes, at age 15 BSO? 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 Her husband retired on disability, but he passed away in 03-30-2015 from mini-strokes. She lives at home with 2 cats. Daughter [name], who is in her 22s, is married to [place] (who works for the [place]) and lives very close to the patient.  [name] works for the [employer] division of social services from home.  The patient's son died at age 47 from accidental overdose.  The patient attends [employer] in [place].   ADVANCED DIRECTIVES: Daughter [name]  is her HCPOA.   HEALTH MAINTENANCE: Social History   Tobacco Use  . Smoking status: Never Smoker  . Smokeless tobacco: Never Used  Substance Use Topics  . Alcohol use: No  . Drug use: No     Colonoscopy: 11/20/2013, Dr. Gala Romney, chronic inflammation  PAP: s/p hysterectomy   Bone density: "had one probably 10 years ago"   Allergies  Allergen Reactions  . Bee Venom Anaphylaxis and Swelling  . Hydromorphone Hcl Other (See Comments)    Agitation, Confusion  . Demerol Nausea Only    Current Outpatient Medications  Medication Sig Dispense Refill  . acyclovir (ZOVIRAX) 400 MG tablet Take 400 mg by mouth 2 (two) times daily.    . ALPHAGAN P 0.1 % SOLN Place 1 drop into both eyes 2 (two) times daily.     [name] Kitchen amLODipine (NORVASC) 5 MG tablet Take 5 mg by mouth every morning.     [name] Kitchen aspirin EC 81 MG tablet Take 81 mg by mouth daily.    [name] Kitchen atorvastatin (LIPITOR) 10 MG tablet Take 10 mg by mouth every evening.     . Bacillus Coagulans-Inulin (PROBIOTIC FORMULA PO) Take 1 capsule by mouth daily.    . BD PEN NEEDLE NANO U/F 32G X 4 MM MISC     . capecitabine (XELODA) 500 MG tablet Take 3 tablets in AM, 2 tablets in PM, Take for 14 days, then hold for 7 days. Repeat every 21 days. 70 tablet 6  . cyanocobalamin (,VITAMIN B-12,) 1000 MCG/ML injection Inject 1,000 mcg into the muscle every 30 (thirty) days.     . dorzolamide (TRUSOPT) 2 % ophthalmic solution Place 1 drop into both eyes 2 (two) times daily.   4  . doxycycline (VIBRA-TABS) 100 MG tablet Take 1 tablet (100 mg total) by mouth daily. 90 tablet 4  . EPIPEN 2-PAK 0.3 MG/0.3ML SOAJ injection Inject 0.3 mg into the muscle as needed for anaphylaxis.     [name] Kitchen FLOVENT HFA 110 MCG/ACT inhaler     . gabapentin (NEURONTIN) 600 MG tablet Take 600 mg by mouth daily.     [name] Kitchen glipiZIDE (GLUCOTROL) 5 MG tablet Take 5 mg by mouth daily before breakfast.     . ipratropium-albuterol (DUONEB) 0.5-2.5 (3) MG/3ML SOLN Take 3 mLs by nebulization 3 (three) times  daily. 360 mL 3  . Lancets (ONETOUCH DELICA PLUS [id number]) MISC     . latanoprost (XALATAN) 0.005 % ophthalmic solution Place 1 drop into both eyes at bedtime.     [name] Kitchen  LORazepam (ATIVAN) 0.5 MG tablet Take 0.5 mg by mouth 2 (two) times daily.    . metoprolol succinate (TOPROL-XL) 100 MG 24 hr tablet Take 100 mg by mouth every morning. Take with or immediately following a meal.    . mirtazapine (REMERON) 15 MG tablet Take 15 mg by mouth at bedtime.     [name] Kitchen MYRBETRIQ 50 MG TB24 tablet Take 50 mg by mouth daily.     [name] Kitchen omeprazole (PRILOSEC) 20 MG capsule Take 20 mg by mouth daily.    Glory Rosebush ULTRA test strip     . Polyethyl Glycol-Propyl Glycol (SYSTANE ULTRA) 0.4-0.3 % SOLN Place 1-2 drops into both eyes daily as needed (for itching eyes).    . torsemide (DEMADEX) 20 MG tablet Take 1 tablet (20 mg total) by mouth daily. 30 tablet 5  . TRESIBA FLEXTOUCH 100 UNIT/ML SOPN FlexTouch Pen Inject 0.5 mLs (50 Units total) into the skin daily. 5 pen 5   No current facility-administered medications for this visit.    OBJECTIVE: Older white woman in no acute distress Vitals:   03/18/19 1500  BP: 131/71  Pulse: 84  Resp: 18  Temp: 98.5 F (36.9 C)  SpO2: 95%   Wt Readings from Last 3 Encounters:  03/18/19 198 lb 14.4 oz (90.2 kg)  02/17/19 200 lb 11.2 oz (91 kg)  01/20/19 206 lb 3.2 oz (93.5 kg)   Body mass index is 38.84 kg/m.   ECOG FS:2 - Symptomatic, <50% confined to bed  The area of concern in the left breast looks erythematous but slightly scaly, suggesting very initial response.  I did not photograph that today.  Left breast 02/17/2019    LAB RESULTS:  CMP     Component Value Date/Time   NA 138 02/17/2019 1225   K 4.5 02/17/2019 1225   CL 101 02/17/2019 1225   CO2 26 02/17/2019 1225   GLUCOSE 449 (H) 02/17/2019 1225   BUN 26 (H) 02/17/2019 1225   CREATININE 2.08 (H) 02/17/2019 1225   CREATININE 2.18 (H) 10/08/2018 1146   CALCIUM 8.5 (L) 02/17/2019 1225   PROT 6.8  02/17/2019 1225   ALBUMIN 3.4 (L) 02/17/2019 1225   AST 29 02/17/2019 1225   AST 27 10/08/2018 1146   ALT 14 02/17/2019 1225   ALT 15 10/08/2018 1146   ALKPHOS 115 02/17/2019 1225   BILITOT 0.6 02/17/2019 1225   BILITOT 1.0 10/08/2018 1146   GFRNONAA 21 (L) 02/17/2019 1225   GFRNONAA 20 (L) 10/08/2018 1146   GFRAA 25 (L) 02/17/2019 1225   GFRAA 24 (L) 10/08/2018 1146    No results found for: TOTALPROTELP, ALBUMINELP, A1GS, A2GS, BETS, BETA2SER, GAMS, MSPIKE, SPEI  No results found for: KPAFRELGTCHN, LAMBDASER, KAPLAMBRATIO  Lab Results  Component Value Date   WBC 6.2 03/18/2019   NEUTROABS 3.8 03/18/2019   HGB 12.3 03/18/2019   HCT 36.9 03/18/2019   MCV 112.2 (H) 03/18/2019   PLT 106 (L) 03/18/2019   No results found for: LABCA2  No components found for: VQMGQQ761  No results for input(s): INR in the last 168 hours.  No results found for: LABCA2  No results found for: PJK932  No results found for: IZT245  No results found for: YKD983  Lab Results  Component Value Date   CA2729 25.6 04/26/2018    No components found for: HGQUANT  No results found for: CEA1 / No results found for: CEA1   No results found for: AFPTUMOR  No results found for: [facility]  No results found for: HGBA, HGBA2QUANT, HGBFQUANT, HGBSQUAN (Hemoglobinopathy evaluation)   No results found for: LDH  No results found for: IRON, TIBC, IRONPCTSAT (Iron and TIBC)  No results found for: FERRITIN  Urinalysis    Component Value Date/Time   COLORURINE YELLOW 11/19/2018 [place] 11/19/2018 1455   LABSPEC 1.009 11/19/2018 1455   PHURINE 6.0 11/19/2018 1455   GLUCOSEU >=500 (A) 11/19/2018 1455   HGBUR NEGATIVE 11/19/2018 1455   BILIRUBINUR NEGATIVE 11/19/2018 1455   KETONESUR NEGATIVE 11/19/2018 1455   PROTEINUR NEGATIVE 11/19/2018 1455   UROBILINOGEN 1.0 12/11/2014 1106   NITRITE NEGATIVE 11/19/2018 1455   LEUKOCYTESUR SMALL (A) 11/19/2018 1455    STUDIES: No  results found.   ELIGIBLE FOR AVAILABLE RESEARCH PROTOCOL: no   ASSESSMENT: 86 y.o. [place] woman status post left lumpectomy and sentinel lymph node sampling 03/12/2012 for a  pT1b pN0, stage IA invasive ductal carcinoma, grade 3, strongly estrogen and progesterone receptor positive (100 and 98%), HER-2 nonamplified, with an MIB-1 of 15%  (a) additional surgery for margin clearance was successful 03/22/2012  (1) the patient opted against adjuvant radiation  (2) anastrozole started March 2014, discontinued 04/03/2018 with evidence of recurrence  RECURRENT DISEASE: February 2020 (3) status post left breast retroareolar biopsy 03/05/2018 for a clinical T1c N1, stage IIA invasive ductal carcinoma, estrogen and progesterone receptor positive, HER-2 not amplified, with an MIB-1 of 20%.  (4) spread to skin/chest wall on left noted 04/03/2018  (a) chest CT 04/11/2018 scan showed nonspecific bilateral axillary adenopathy   (b) bone scan 04/11/2018 shows only degenerative disease  (c) CA 27-29 not informative (was 25.6 on 04/26/2018)  (d) biopsy of 2 left breast skin nodules 04/04/2018 shows invasive carcinoma, estrogen receptor 100% positive, progesterone receptor and HER-2 negative, with an MIB-1 of 30%.  (5) started fulvestrant 04/12/2018  (a) added palbociclib 04/26/2018 at 125 mg p.o. daily, 21 days on, 7 days off   (1) dose decreased to 75 mg daily, 21/03 September 2018   (2) dose decreased to 75 mg every other day, 21/7 with October 2020 cycle  (b) tamoxifen added and fulvestrant changed to every 8 weeks after 08/13/2018 dose to minimize exposure during the pandemic, discontinued when monthly fulvestrant resumed  (c) fulvestrant resumed at every 4 weeks beginning with 11/25/2018 dose  (d) fulvestrant and palbociclib discontinued 02/17/2019 with evidence of chest wall progression  (6) CT of the head, chest, abdomen, and pelvis August/September 2020 shows no evidence of metastatic disease  outside of the left breast  (a) plain films of the cervical thoracic lumbar spine and ribs shows no obvious bone lesions  (7) capecitabine started 03/02/2018, initially at 1000 mg p.o. twice daily.  (a) dose increased to 1500 mg in the morning and 1000 mg in the evening starting 03/24/2019   PLAN: Metzli did very well with her first cycle of capecitabine, at reduced doses.  It actually normalized her bowel movements.  It did cause some fatigue, but no other problems.  I think we can safely go to 3 tablets in the morning and 2 in the evening.  They are aware of the problem with diarrhea mouth sores and palmar plantar erythrodysesthesia and will call with any issues.  Otherwise they will see [place] again in 3 weeks.  At that time we will decide whether we can go to 3 and 3 which would be the target dose  I do think there may be the very slightest bit of peeling which would be  a good sign.  As far as the area where the tumor is ulcerating she will use some wet-to-dry dressings and also use that doxycycline daily for the smell.  She knows to call for any other issues that may develop before her next visit here.  Total encounter time 30 minutes.Chauncey Cruel, MD Medical Oncology and Hematology [facility] [place], [place] [address] Tel. [phone]    Fax. [phone]   I, Wilburn Mylar, am acting as scribe for Dr. Virgie Dad. .  I, Lurline Del MD, have reviewed the above documentation for accuracy and completeness, and I agree with the above.   *Total Encounter Time as defined by the Centers for Medicare and Medicaid Services includes, in addition to the face-to-face time of a patient visit (documented in the note above) non-face-to-face time: obtaining and reviewing outside history, ordering and reviewing medications, tests or procedures, care coordination (communications with other health care professionals or caregivers) and  documentation in the medical record.

## 2019-03-18 ENCOUNTER — Inpatient Hospital Stay: Payer: Medicare HMO | Attending: Oncology | Admitting: Oncology

## 2019-03-18 ENCOUNTER — Other Ambulatory Visit: Payer: Self-pay

## 2019-03-18 ENCOUNTER — Inpatient Hospital Stay: Payer: Medicare HMO

## 2019-03-18 VITALS — BP 131/71 | HR 84 | Temp 98.5°F | Resp 18 | Ht 60.0 in | Wt 198.9 lb

## 2019-03-18 DIAGNOSIS — IMO0002 Reserved for concepts with insufficient information to code with codable children: Secondary | ICD-10-CM

## 2019-03-18 DIAGNOSIS — Z170 Estrogen receptor positive status [ER+]: Secondary | ICD-10-CM | POA: Diagnosis not present

## 2019-03-18 DIAGNOSIS — Z79899 Other long term (current) drug therapy: Secondary | ICD-10-CM | POA: Insufficient documentation

## 2019-03-18 DIAGNOSIS — R69 Illness, unspecified: Secondary | ICD-10-CM | POA: Diagnosis not present

## 2019-03-18 DIAGNOSIS — M8580 Other specified disorders of bone density and structure, unspecified site: Secondary | ICD-10-CM

## 2019-03-18 DIAGNOSIS — K219 Gastro-esophageal reflux disease without esophagitis: Secondary | ICD-10-CM | POA: Diagnosis not present

## 2019-03-18 DIAGNOSIS — E1140 Type 2 diabetes mellitus with diabetic neuropathy, unspecified: Secondary | ICD-10-CM

## 2019-03-18 DIAGNOSIS — C50312 Malignant neoplasm of lower-inner quadrant of left female breast: Secondary | ICD-10-CM | POA: Diagnosis not present

## 2019-03-18 DIAGNOSIS — E785 Hyperlipidemia, unspecified: Secondary | ICD-10-CM | POA: Diagnosis not present

## 2019-03-18 DIAGNOSIS — E119 Type 2 diabetes mellitus without complications: Secondary | ICD-10-CM | POA: Diagnosis not present

## 2019-03-18 DIAGNOSIS — D649 Anemia, unspecified: Secondary | ICD-10-CM | POA: Diagnosis not present

## 2019-03-18 DIAGNOSIS — C50012 Malignant neoplasm of nipple and areola, left female breast: Secondary | ICD-10-CM | POA: Diagnosis not present

## 2019-03-18 DIAGNOSIS — Z800 Family history of malignant neoplasm of digestive organs: Secondary | ICD-10-CM | POA: Insufficient documentation

## 2019-03-18 DIAGNOSIS — C50912 Malignant neoplasm of unspecified site of left female breast: Secondary | ICD-10-CM

## 2019-03-18 DIAGNOSIS — Z85828 Personal history of other malignant neoplasm of skin: Secondary | ICD-10-CM | POA: Diagnosis not present

## 2019-03-18 DIAGNOSIS — K7460 Unspecified cirrhosis of liver: Secondary | ICD-10-CM | POA: Diagnosis not present

## 2019-03-18 DIAGNOSIS — C50812 Malignant neoplasm of overlapping sites of left female breast: Secondary | ICD-10-CM

## 2019-03-18 DIAGNOSIS — Z9181 History of falling: Secondary | ICD-10-CM | POA: Insufficient documentation

## 2019-03-18 DIAGNOSIS — I129 Hypertensive chronic kidney disease with stage 1 through stage 4 chronic kidney disease, or unspecified chronic kidney disease: Secondary | ICD-10-CM | POA: Diagnosis not present

## 2019-03-18 DIAGNOSIS — E782 Mixed hyperlipidemia: Secondary | ICD-10-CM | POA: Diagnosis not present

## 2019-03-18 DIAGNOSIS — N189 Chronic kidney disease, unspecified: Secondary | ICD-10-CM | POA: Diagnosis not present

## 2019-03-18 LAB — CBC WITH DIFFERENTIAL/PLATELET
Abs Immature Granulocytes: 0.03 10*3/uL (ref 0.00–0.07)
Basophils Absolute: 0.1 10*3/uL (ref 0.0–0.1)
Basophils Relative: 1 %
Eosinophils Absolute: 0.4 10*3/uL (ref 0.0–0.5)
Eosinophils Relative: 6 %
HCT: 36.9 % (ref 36.0–46.0)
Hemoglobin: 12.3 g/dL (ref 12.0–15.0)
Immature Granulocytes: 1 %
Lymphocytes Relative: 23 %
Lymphs Abs: 1.4 10*3/uL (ref 0.7–4.0)
MCH: 37.4 pg — ABNORMAL HIGH (ref 26.0–34.0)
MCHC: 33.3 g/dL (ref 30.0–36.0)
MCV: 112.2 fL — ABNORMAL HIGH (ref 80.0–100.0)
Monocytes Absolute: 0.5 10*3/uL (ref 0.1–1.0)
Monocytes Relative: 8 %
Neutro Abs: 3.8 10*3/uL (ref 1.7–7.7)
Neutrophils Relative %: 61 %
Platelets: 106 10*3/uL — ABNORMAL LOW (ref 150–400)
RBC: 3.29 MIL/uL — ABNORMAL LOW (ref 3.87–5.11)
RDW: 15 % (ref 11.5–15.5)
WBC: 6.2 10*3/uL (ref 4.0–10.5)
nRBC: 0 % (ref 0.0–0.2)

## 2019-03-18 LAB — COMPREHENSIVE METABOLIC PANEL
ALT: 11 U/L (ref 0–44)
AST: 31 U/L (ref 15–41)
Albumin: 3.4 g/dL — ABNORMAL LOW (ref 3.5–5.0)
Alkaline Phosphatase: 113 U/L (ref 38–126)
Anion gap: 14 (ref 5–15)
BUN: 28 mg/dL — ABNORMAL HIGH (ref 8–23)
CO2: 25 mmol/L (ref 22–32)
Calcium: 8.5 mg/dL — ABNORMAL LOW (ref 8.9–10.3)
Chloride: 100 mmol/L (ref 98–111)
Creatinine, Ser: 2.18 mg/dL — ABNORMAL HIGH (ref 0.44–1.00)
GFR calc Af Amer: 24 mL/min — ABNORMAL LOW (ref >60)
GFR calc non Af Amer: 20 mL/min — ABNORMAL LOW (ref >60)
Glucose, Bld: 534 mg/dL — ABNORMAL HIGH (ref 70–99)
Potassium: 3.6 mmol/L (ref 3.5–5.1)
Sodium: 139 mmol/L (ref 135–145)
Total Bilirubin: 0.9 mg/dL (ref 0.3–1.2)
Total Protein: 6.7 g/dL (ref 6.5–8.1)

## 2019-03-18 MED ORDER — CAPECITABINE 500 MG PO TABS: ORAL_TABLET | ORAL | 6 refills | Status: AC

## 2019-03-18 MED ORDER — DOXYCYCLINE HYCLATE 100 MG PO TABS: 100.0000 mg | ORAL_TABLET | Freq: Every day | ORAL | 4 refills | Status: DC

## 2019-03-18 MED FILL — CAPECITABINE 500 MG TABLET: 500 | 21 days supply | Qty: 70 | Fill #0

## 2019-03-18 MED FILL — DOXYCYCLINE HYCLATE 100 MG: 100 | 90 days supply | Qty: 90 | Fill #0

## 2019-03-19 ENCOUNTER — Telehealth: Payer: Self-pay | Admitting: Oncology

## 2019-03-19 LAB — CANCER ANTIGEN 27.29: CA 27.29: 67.8 U/mL — ABNORMAL HIGH (ref 0.0–38.6)

## 2019-03-19 NOTE — Telephone Encounter (Signed)
I talk with patient regarding schedule  

## 2019-03-20 DIAGNOSIS — I10 Essential (primary) hypertension: Secondary | ICD-10-CM | POA: Diagnosis not present

## 2019-03-20 DIAGNOSIS — E785 Hyperlipidemia, unspecified: Secondary | ICD-10-CM | POA: Diagnosis not present

## 2019-03-20 DIAGNOSIS — E1165 Type 2 diabetes mellitus with hyperglycemia: Secondary | ICD-10-CM | POA: Diagnosis not present

## 2019-03-20 DIAGNOSIS — D519 Vitamin B12 deficiency anemia, unspecified: Secondary | ICD-10-CM | POA: Diagnosis not present

## 2019-03-26 DIAGNOSIS — R69 Illness, unspecified: Secondary | ICD-10-CM | POA: Diagnosis not present

## 2019-04-08 ENCOUNTER — Inpatient Hospital Stay: Payer: Medicare HMO

## 2019-04-08 ENCOUNTER — Encounter: Payer: Self-pay | Admitting: Adult Health

## 2019-04-08 ENCOUNTER — Other Ambulatory Visit: Payer: Self-pay

## 2019-04-08 ENCOUNTER — Inpatient Hospital Stay (HOSPITAL_BASED_OUTPATIENT_CLINIC_OR_DEPARTMENT_OTHER): Payer: Medicare HMO | Admitting: Adult Health

## 2019-04-08 VITALS — BP 112/51 | HR 76 | Temp 98.6°F | Resp 18 | Ht 60.0 in | Wt 203.7 lb

## 2019-04-08 DIAGNOSIS — K5732 Diverticulitis of large intestine without perforation or abscess without bleeding: Secondary | ICD-10-CM | POA: Diagnosis not present

## 2019-04-08 DIAGNOSIS — C50812 Malignant neoplasm of overlapping sites of left female breast: Secondary | ICD-10-CM

## 2019-04-08 DIAGNOSIS — C50312 Malignant neoplasm of lower-inner quadrant of left female breast: Secondary | ICD-10-CM | POA: Diagnosis not present

## 2019-04-08 DIAGNOSIS — Z170 Estrogen receptor positive status [ER+]: Secondary | ICD-10-CM | POA: Diagnosis not present

## 2019-04-08 DIAGNOSIS — IMO0002 Reserved for concepts with insufficient information to code with codable children: Secondary | ICD-10-CM

## 2019-04-08 DIAGNOSIS — E1140 Type 2 diabetes mellitus with diabetic neuropathy, unspecified: Secondary | ICD-10-CM

## 2019-04-08 DIAGNOSIS — C50912 Malignant neoplasm of unspecified site of left female breast: Secondary | ICD-10-CM

## 2019-04-08 DIAGNOSIS — K521 Toxic gastroenteritis and colitis: Secondary | ICD-10-CM | POA: Diagnosis not present

## 2019-04-08 LAB — CBC WITH DIFFERENTIAL/PLATELET
Abs Immature Granulocytes: 0.04 10*3/uL (ref 0.00–0.07)
Basophils Absolute: 0 10*3/uL (ref 0.0–0.1)
Basophils Relative: 0 %
Eosinophils Absolute: 0.3 10*3/uL (ref 0.0–0.5)
Eosinophils Relative: 3 %
HCT: 32.4 % — ABNORMAL LOW (ref 36.0–46.0)
Hemoglobin: 11.1 g/dL — ABNORMAL LOW (ref 12.0–15.0)
Immature Granulocytes: 1 %
Lymphocytes Relative: 15 %
Lymphs Abs: 1.2 10*3/uL (ref 0.7–4.0)
MCH: 37.4 pg — ABNORMAL HIGH (ref 26.0–34.0)
MCHC: 34.3 g/dL (ref 30.0–36.0)
MCV: 109.1 fL — ABNORMAL HIGH (ref 80.0–100.0)
Monocytes Absolute: 0.5 10*3/uL (ref 0.1–1.0)
Monocytes Relative: 6 %
Neutro Abs: 6.3 10*3/uL (ref 1.7–7.7)
Neutrophils Relative %: 75 %
Platelets: 93 10*3/uL — ABNORMAL LOW (ref 150–400)
RBC: 2.97 MIL/uL — ABNORMAL LOW (ref 3.87–5.11)
RDW: 15.7 % — ABNORMAL HIGH (ref 11.5–15.5)
WBC: 8.3 10*3/uL (ref 4.0–10.5)
nRBC: 0 % (ref 0.0–0.2)

## 2019-04-08 LAB — COMPREHENSIVE METABOLIC PANEL
ALT: 8 U/L (ref 0–44)
AST: 14 U/L — ABNORMAL LOW (ref 15–41)
Albumin: 2.3 g/dL — ABNORMAL LOW (ref 3.5–5.0)
Alkaline Phosphatase: 57 U/L (ref 38–126)
Anion gap: 10 (ref 5–15)
BUN: 56 mg/dL — ABNORMAL HIGH (ref 8–23)
CO2: 21 mmol/L — ABNORMAL LOW (ref 22–32)
Calcium: 7.4 mg/dL — ABNORMAL LOW (ref 8.9–10.3)
Chloride: 102 mmol/L (ref 98–111)
Creatinine, Ser: 2.49 mg/dL — ABNORMAL HIGH (ref 0.44–1.00)
GFR calc Af Amer: 20 mL/min — ABNORMAL LOW (ref >60)
GFR calc non Af Amer: 17 mL/min — ABNORMAL LOW (ref >60)
Glucose, Bld: 339 mg/dL — ABNORMAL HIGH (ref 70–99)
Potassium: 3.3 mmol/L — ABNORMAL LOW (ref 3.5–5.1)
Sodium: 133 mmol/L — ABNORMAL LOW (ref 135–145)
Total Bilirubin: 0.9 mg/dL (ref 0.3–1.2)
Total Protein: 4.4 g/dL — ABNORMAL LOW (ref 6.5–8.1)

## 2019-04-08 MED ORDER — VALACYCLOVIR HCL 1 G PO TABS: 1000.0000 mg | ORAL_TABLET | Freq: Two times a day (BID) | ORAL | 12 refills | Status: DC

## 2019-04-08 MED ORDER — VALACYCLOVIR HCL 500 MG PO TABS: 500.0000 mg | ORAL_TABLET | Freq: Two times a day (BID) | ORAL | 2 refills | Status: AC

## 2019-04-08 MED ORDER — SODIUM CHLORIDE 0.9 % IV SOLN
INTRAVENOUS | Status: AC
  Filled 2019-04-08 (×2): qty 250

## 2019-04-08 MED ORDER — CHOLESTYRAMINE 4 G PO PACK: 4.0000 g | PACK | Freq: Two times a day (BID) | ORAL | 12 refills | Status: AC

## 2019-04-08 MED ORDER — FLUCONAZOLE 200 MG PO TABS: 200.0000 mg | ORAL_TABLET | Freq: Every day | ORAL | 3 refills | Status: AC

## 2019-04-08 MED FILL — CAPECITABINE 500 MG TABLET: 500 | 21 days supply | Qty: 70 | Fill #1

## 2019-04-08 NOTE — Progress Notes (Signed)
[place]  [phone] [phone]    ID: [name] DOB: 12-Aug-1933  MR#: [id number]  PPJ#:[id number]  Patient Care Team: Celene Squibb, MD as PCP - General (Internal Medicine) Gala Romney Cristopher Estimable, MD as Consulting Physician (Gastroenterology) Magrinat, Virgie Dad, MD as Consulting Physician (Oncology) Jovita Kussmaul, MD as Consulting Physician (General Surgery) Gery Pray, MD as Consulting Physician (Radiation Oncology) Erline Levine, MD as Consulting Physician (Neurosurgery) Scot Dock, NP OTHER MD:   CHIEF COMPLAINT: Recurrent estrogen receptor positive breast cancer  CURRENT TREATMENT: capecitabine 03/02/2018   INTERVAL HISTORY: [name] returns today for follow-up and treatment of her recurrent estrogen receptor positive breast cancer accompanied by her daughter.   [name] started on capecitabine on 03/03/2019.  [name] initially was taking two tablets BID, 14 days on and 7 days off.  This second round [name] went to 3 tablets in the morning and 2 tablets in the evening, 14 days on and 7 days off.    Since her last visit, [name] has not undergone any additional studies. Her most recent mammogram was performed on 02/26/2018, when her disease recurrence was first seen.   REVIEW OF SYSTEMS: [name] had a rough weekend.  [name] developed mouth sores over the weekend.  [name] also developed diarrhea.  [name] had some Lomotil.  [name] was taking this four times a day since Satruday and hasn't been working that well.  Whenever [name] eats the food is going straight through her.  [name] had some black stool on Saturday and again yesterday.  [name] was taking Doxycycline last week.  [name] had a yeast infection, and [name] took some diflucan which helped some.  [name] took one yesterday, and that has helped some.    [name] is taking insulin, sliding scale of Novolog x 2 weeks. [name] is also taking Tramadol for pain in her lower back, as well as breast pain and bleeding from the skin.  [name] is  taking Acyclovir BID in addition to magic mouthwash for her mucositis.  The pain in her mouth has contributed to her not being able to eat.  [name] has been drinking about 6 bottles of water a day, however has been mixing in Gatorade since developing the diarrhea.  [name] also has had some splitting in her fingertips of her skin.    HISTORY OF CURRENT ILLNESS: From the original intake note  [name] has a history of left breast invasive ductal carcinoma,  status post left lumpectomy and sentinel lymph node sampling 03/12/2012 for a pT1b pN0, stage IA invasive ductal carcinoma, grade 3, strongly estrogen and progesterone receptor positive, HER-2 nonamplified, with an MIB-1-1 of 15%.  The single sentinel lymph node was negative.  There was a positive margin.  [name] had reexcision on 03/22/2012 for what proved to be a single residual focus of intermediate grade ductal carcinoma in situ.  This was close to the lateral inked margin which was however negative.  [name] saw radiation oncology (Drs. Orlene Erm and Valere Dross) who felt adjuvant endocrine therapy alone would be adequate. [name] started anastrozole under Dr. Jacquiline Doe at [facility], on which [name] continues.  More recently [name] underwent routine bilateral diagnostic mammography with tomography and left breast ultrasonography at [facility] on 02/26/2018 showing: highly suspicious 1.6 cm retroareolar left breast mass, and single abnormal left axillary lymph node.  Accordingly on 03/05/2018 [name] proceeded to biopsy of the left breast area in question. The pathology ([id number]) from this procedure showed: invasive ductal carcinoma, grade 3;  ductal carcinoma in the lymph node tissue. Prognostic indicators significant for: estrogen receptor, 95% positive and progesterone receptor, 5% positive, both with strong staining intensity. Proliferation marker Ki67 at 20%. HER2 negative by immunohistochemistry, 1+.  The patient's subsequent history is as detailed  below.   PAST MEDICAL HISTORY: Past Medical History:  Diagnosis Date   Abrasion of arm, left    secondary to fall    Anemia    Arthritis    Breast cancer, left (HCC)    er/pr +   Cataract    no surgery as yet,starting of 3 years   Chronic kidney disease    renal stones, one episode of Lithotripsy   Cirrhosis ([place])    Metavir score 4   Complication of anesthesia    Diabetes mellitus    GERD (gastroesophageal reflux disease)    Hyperlipidemia    Hypertension    followed by [facility]. relative to  urology study grp.  Doesn't report ever having a stress test    Mental disorder    Multiple falls    Nocturia    PONV (postoperative nausea and vomiting)    Seasonal allergies    Skin cancer    face- melanoma, 2012- surg. excision      PAST SURGICAL HISTORY: Past Surgical History:  Procedure Laterality Date   ABDOMINAL HYSTERECTOMY     BACK SURGERY     x4 back surgery to include rods & screws  . Last in May 2014   BREAST LUMPECTOMY WITH NEEDLE LOCALIZATION AND AXILLARY SENTINEL LYMPH NODE BX  03/12/2012   Procedure: BREAST LUMPECTOMY WITH NEEDLE LOCALIZATION AND AXILLARY SENTINEL LYMPH NODE BX;  Surgeon: Merrie Roof, MD;  Location: [facility];  Service: General;  Laterality: Left;   BREAST SURGERY     lumpectomy x2/left    CHOLECYSTECTOMY     COLONOSCOPY N/A 11/20/2013   Dr.Rourk- diverticula was found in the left colon. o/w normal rectal, colonic and terminal ileal mucosa   ESOPHAGOGASTRODUODENOSCOPY N/A 11/20/2013   Dr.Rourk- abnormal mucosa was found. diffuse snake skinning and friability of the gastric mucosa. patent pylorus. abnormal-appearing first, second and third portion of the duodenum. 75m gastric nodule at the GE junction. stomach bx= minimal chronic infammation, GE junction bx= polypoid fragments of gastric cardia type mucosa w/mod chronic inflammation and foveolar hyperplasia   EYE SURGERY     cataract surgery bilat    FRACTURE SURGERY      1975- ORIF- L ankle    GIVENS CAPSULE STUDY N/A 02/24/2014   Procedure: GIVENS CAPSULE STUDY;  Surgeon: RDaneil Dolin MD;  [facility];  Service: Endoscopy;  Laterality: N/A;   RE-EXCISION OF BREAST LUMPECTOMY  03/22/2012   Procedure: RE-EXCISION OF BREAST LUMPECTOMY;  Surgeon: PMerrie Roof MD;  [facility]  Service: General;  Laterality: Left;  re-excision left breast lumpectomy cavity  ER+, PR+   ROTATOR CUFF REPAIR Bilateral    SHOULDER SURGERY     SKIN BIOPSY     TOTAL KNEE ARTHROPLASTY Right 12/18/2014   Procedure: RIGHT TOTAL KNEE ARTHROPLASTY;  Surgeon: RSydnee Cabal MD;  Location: [facility];  Service: Orthopedics;  Laterality: Right;   TUBAL LIGATION      FAMILY HISTORY: Family History  Problem Relation Age of Onset   Dementia Mother    Heart disease Father    Emphysema Father    Cancer Brother        metastatic, unsure primary   Stroke Sister  CAD Brother    Colon cancer Neg Hx    Prostate cancer Neg Hx    Patient father was 2 years old when he died from "heart trouble" and emphysema. Patient mother died from mini-stroke complications at age 36. [name] denies a family hx of breast or ovarian cancer, prostate or pancreatic cancers. [name] has 3 siblings. [name] had a twin brother who died at age 16 from metastatic cancer. [name] had 2 sisters. Her only living sibling is a sister.   GYNECOLOGIC HISTORY:  No LMP recorded. Patient has had a hysterectomy. Menarche: 86 years old Age at first live birth: 86 years old [name] 2 LMP s/[name] hysterectomy Contraceptive no HRT yes, for more than 5 years  Hysterectomy? Yes, at age 25 BSO? 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 Her husband retired on disability, but he passed away in 05-16-2015 from mini-strokes. [name] lives at home with 2 cats. Daughter [name], who is in her 28s, is married  to [place]pton (who works for the [employer]) and lives very close to the patient.  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  The patient's son died at age 31 from accidental overdose.  The patient attends [employer] in [place].   ADVANCED DIRECTIVES: Daughter [name] is her HCPOA.   HEALTH MAINTENANCE: Social History   Tobacco Use   Smoking status: Never Smoker   Smokeless tobacco: Never Used  Substance Use Topics   Alcohol use: No   Drug use: No     Colonoscopy: 11/20/2013, Dr. Gala Romney, chronic inflammation  PAP: s/[name] hysterectomy   Bone density: "had one probably 10 years ago"   Allergies  Allergen Reactions   Bee Venom Anaphylaxis and Swelling   Hydromorphone Hcl Other (See Comments)    Agitation, Confusion   Demerol Nausea Only    Current Outpatient Medications  Medication Sig Dispense Refill   acyclovir (ZOVIRAX) 400 MG tablet Take 400 mg by mouth 2 (two) times daily.     ALPHAGAN [name] 0.1 % SOLN Place 1 drop into both eyes 2 (two) times daily.      amLODipine (NORVASC) 5 MG tablet Take 5 mg by mouth every morning.      aspirin EC 81 MG tablet Take 81 mg by mouth daily.     atorvastatin (LIPITOR) 10 MG tablet Take 10 mg by mouth every evening.      Bacillus Coagulans-Inulin (PROBIOTIC FORMULA PO) Take 1 capsule by mouth daily.     BD PEN NEEDLE NANO U/F 32G X 4 MM MISC      capecitabine (XELODA) 500 MG tablet Take 3 tablets in AM, 2 tablets in PM, Take for 14 days, then hold for 7 days. Repeat every 21 days. 70 tablet 6   cyanocobalamin (,VITAMIN B-12,) 1000 MCG/ML injection Inject 1,000 mcg into the muscle every 30 (thirty) days.      dorzolamide (TRUSOPT) 2 % ophthalmic solution Place 1 drop into both eyes 2 (two) times daily.   4   doxycycline (VIBRA-TABS) 100 MG tablet Take 1 tablet (100 mg total) by mouth daily. 90 tablet 4   EPIPEN 2-PAK 0.3 MG/0.3ML SOAJ injection Inject 0.3 mg into the muscle as needed for anaphylaxis.       FLOVENT HFA 110 MCG/ACT inhaler      gabapentin (NEURONTIN) 600 MG tablet Take 600 mg by mouth daily.  glipiZIDE (GLUCOTROL) 5 MG tablet Take 5 mg by mouth daily before breakfast.      ipratropium-albuterol (DUONEB) 0.5-2.5 (3) MG/3ML SOLN Take 3 mLs by nebulization 3 (three) times daily. 360 mL 3   Lancets (ONETOUCH DELICA PLUS [id number]) MISC      latanoprost (XALATAN) 0.005 % ophthalmic solution Place 1 drop into both eyes at bedtime.      LORazepam (ATIVAN) 0.5 MG tablet Take 0.5 mg by mouth 2 (two) times daily.     metoprolol succinate (TOPROL-XL) 100 MG 24 hr tablet Take 100 mg by mouth every morning. Take with or immediately following a meal.     mirtazapine (REMERON) 15 MG tablet Take 15 mg by mouth at bedtime.      MYRBETRIQ 50 MG TB24 tablet Take 50 mg by mouth daily.      NOVOLOG FLEXPEN 100 UNIT/ML FlexPen      ONETOUCH ULTRA test strip      Polyethyl Glycol-Propyl Glycol (SYSTANE ULTRA) 0.4-0.3 % SOLN Place 1-2 drops into both eyes daily as needed (for itching eyes).     torsemide (DEMADEX) 20 MG tablet Take 1 tablet (20 mg total) by mouth daily. 30 tablet 5   traMADol (ULTRAM) 50 MG tablet Take 50 mg by mouth every 6 (six) hours as needed.     TRESIBA FLEXTOUCH 100 UNIT/ML SOPN FlexTouch Pen Inject 0.5 mLs (50 Units total) into the skin daily. 5 pen 5   No current facility-administered medications for this visit.    OBJECTIVE: Older white [name] in no acute distress Vitals:   04/08/19 1444  BP: (!) 112/51  Pulse: 76  Resp: 18  Temp: 98.6 F (37 C)  SpO2: 97%   Wt Readings from Last 3 Encounters:  04/08/19 203 lb 11.2 oz (92.4 kg)  03/18/19 198 lb 14.4 oz (90.2 kg)  02/17/19 200 lb 11.2 oz (91 kg)   Body mass index is 39.78 kg/m.   ECOG FS:2 - Symptomatic, <50% confined to bed  GENERAL: Patient is a tired appearing female in no acute distress HEENT:  Sclerae anicteric.  Blisters noted on lower lip throughout inner lower lip.  Mouth is moist, no  other ulcerations noted.  No thrush.   Neck is supple.  NODES:  No cervical, supraclavicular, or axillary lymphadenopathy palpated.  BREAST EXAM:  Deferred. LUNGS:  Clear to auscultation bilaterally.  No wheezes or rhonchi. HEART:  Regular rate and rhythm. No murmur appreciated. ABDOMEN:  Soft, nontender.  Positive, normoactive bowel sounds. No organomegaly palpated. MSK:  No focal spinal tenderness to palpation. Full range of motion bilaterally in the upper extremities. EXTREMITIES:  No peripheral edema.   SKIN:  Clear with no obvious rashes or skin changes. No nail dyscrasia. NEURO:  Nonfocal. Well oriented.  Appropriate affect.    Left breast 02/17/2019   Left breast on 04/08/2019  LAB RESULTS:  CMP     Component Value Date/Time   NA 133 (L) 04/08/2019 1420   K 3.3 (L) 04/08/2019 1420   CL 102 04/08/2019 1420   CO2 21 (L) 04/08/2019 1420   GLUCOSE 339 (H) 04/08/2019 1420   BUN 56 (H) 04/08/2019 1420   CREATININE 2.49 (H) 04/08/2019 1420   CREATININE 2.18 (H) 10/08/2018 1146   CALCIUM 7.4 (L) 04/08/2019 1420   PROT 4.4 (L) 04/08/2019 1420   ALBUMIN 2.3 (L) 04/08/2019 1420   AST 14 (L) 04/08/2019 1420   AST 27 10/08/2018 1146   ALT 8 04/08/2019 1420   ALT 15 10/08/2018  1146   ALKPHOS 57 04/08/2019 1420   BILITOT 0.9 04/08/2019 1420   BILITOT 1.0 10/08/2018 1146   GFRNONAA 17 (L) 04/08/2019 1420   GFRNONAA 20 (L) 10/08/2018 1146   GFRAA 20 (L) 04/08/2019 1420   GFRAA 24 (L) 10/08/2018 1146    No results found for: TOTALPROTELP, ALBUMINELP, A1GS, A2GS, BETS, BETA2SER, GAMS, MSPIKE, SPEI  No results found for: KPAFRELGTCHN, LAMBDASER, KAPLAMBRATIO  Lab Results  Component Value Date   WBC 8.3 04/08/2019   NEUTROABS 6.3 04/08/2019   HGB 11.1 (L) 04/08/2019   HCT 32.4 (L) 04/08/2019   MCV 109.1 (H) 04/08/2019   PLT 93 (L) 04/08/2019   No results found for: LABCA2  No components found for: FOYDXA128  No results for input(s): INR in the last 168 hours.  No  results found for: LABCA2  No results found for: NOM767  No results found for: MCN470  No results found for: JGG836  Lab Results  Component Value Date   CA2729 67.8 (H) 03/18/2019    No components found for: HGQUANT  No results found for: CEA1 / No results found for: CEA1   No results found for: AFPTUMOR  No results found for: CHROMOGRNA  No results found for: HGBA, HGBA2QUANT, HGBFQUANT, HGBSQUAN (Hemoglobinopathy evaluation)   No results found for: LDH  No results found for: IRON, TIBC, IRONPCTSAT (Iron and TIBC)  No results found for: FERRITIN  Urinalysis    Component Value Date/Time   COLORURINE YELLOW 11/19/2018 1455   APPEARANCEUR CLEAR 11/19/2018 1455   LABSPEC 1.009 11/19/2018 1455   PHURINE 6.0 11/19/2018 1455   GLUCOSEU >=500 (A) 11/19/2018 1455   HGBUR NEGATIVE 11/19/2018 1455   BILIRUBINUR NEGATIVE 11/19/2018 1455   KETONESUR NEGATIVE 11/19/2018 1455   PROTEINUR NEGATIVE 11/19/2018 1455   UROBILINOGEN 1.0 12/11/2014 1106   NITRITE NEGATIVE 11/19/2018 1455   LEUKOCYTESUR SMALL (A) 11/19/2018 1455    STUDIES: No results found.   ELIGIBLE FOR AVAILABLE RESEARCH PROTOCOL: no   ASSESSMENT: 86 y.o. [name] status post left lumpectomy and sentinel lymph node sampling 03/12/2012 for a  pT1b pN0, stage IA invasive ductal carcinoma, grade 3, strongly estrogen and progesterone receptor positive (100 and 98%), HER-2 nonamplified, with an MIB-1 of 15%  (a) additional surgery for margin clearance was successful 03/22/2012  (1) the patient opted against adjuvant radiation  (2) anastrozole started March 2014, discontinued 04/03/2018 with evidence of recurrence  RECURRENT DISEASE: February 2020 (3) status post left breast retroareolar biopsy 03/05/2018 for a clinical T1c N1, stage IIA invasive ductal carcinoma, estrogen and progesterone receptor positive, HER-2 not amplified, with an MIB-1 of 20%.  (4) spread to skin/chest wall on left noted  04/03/2018  (a) chest CT 04/11/2018 scan showed nonspecific bilateral axillary adenopathy   (b) bone scan 04/11/2018 shows only degenerative disease  (c) CA 27-29 not informative (was 25.6 on 04/26/2018)  (d) biopsy of 2 left breast skin nodules 04/04/2018 shows invasive carcinoma, estrogen receptor 100% positive, progesterone receptor and HER-2 negative, with an MIB-1 of 30%.  (5) started fulvestrant 04/12/2018  (a) added palbociclib 04/26/2018 at 125 mg [name].o. daily, 21 days on, 7 days off   (1) dose decreased to 75 mg daily, 21/03 September 2018   (2) dose decreased to 75 mg every other day, 21/7 with October 2020 cycle  (b) tamoxifen added and fulvestrant changed to every 8 weeks after 08/13/2018 dose to minimize exposure during the pandemic, discontinued when monthly fulvestrant resumed  (c) fulvestrant resumed at every 4  weeks beginning with 11/25/2018 dose  (d) fulvestrant and palbociclib discontinued 02/17/2019 with evidence of chest wall progression  (6) CT of the head, chest, abdomen, and pelvis August/September 2020 shows no evidence of metastatic disease outside of the left breast  (a) plain films of the cervical thoracic lumbar spine and ribs shows no obvious bone lesions  (7) capecitabine started 03/02/2018, initially at 1000 mg [name].o. twice daily.  (a) dose increased to 1500 mg in the morning and 1000 mg in the evening starting 03/24/2019   PLAN: [name] did not tolerate increasing the Capecitabine dosage.  [name] has struggled with diarrhea, hand foot syndrome, and oral ulcerations.  [name] is drinking fluids, but her creatinine has increased indicating that [name] is dehydrated.  [name] will not restart the capecitabine for two weeks, and we will see her prior to the restart.  [name] hasn't been restaged since 09/2018, so I placed orders for CT chest, abdomen, and pelvis.    [name]'s breast has improved since taking doxycline. [name] has subsequently developed vaginal candida infection.  I prescribed  Fluconazole for her and we will stop the doxycycline for now.    [name] has mucositis.  I ordered valtrex '500mg'$  PO bid and [name] will continue the magic mouthwash.  This will hopefully help improve things in her mouth.  I placed an order for questran for [name] to take BID.  [name] will also receive IV fluids today.  We will see her back in 2 weeks for labs and f/u.  I offered to have her return tomorrow, or Thursday/friday/saturday for more IV fluids, however [name] has a PCP appointment.  [name] and her daughter will play it by ear tomorrow, and if needed [name] will call us.    We will get her restaging, and have her return in 2 weeks for labs, f/u with Dr. Jana Hakim.  [name] was recommended to continue with the appropriate pandemic precautions. [name] knows to call for any questions that may arise between now and her next appointment.  We are happy to see her sooner if needed.  Total encounter time: 60 minutes  Wilber Bihari, NP 04/08/19 3:12 PM Medical Oncology and Hematology [facility] [place], [place] [address] Tel. [phone]    Fax. [phone]      *Total Encounter Time as defined by the Centers for Medicare and Medicaid Services includes, in addition to the face-to-face time of a patient visit (documented in the note above) non-face-to-face time: obtaining and reviewing outside history, ordering and reviewing medications, tests or procedures, care coordination (communications with other health care professionals or caregivers) and documentation in the medical record.

## 2019-04-08 NOTE — Progress Notes (Signed)
Left breast [id number]

## 2019-04-09 ENCOUNTER — Encounter (HOSPITAL_COMMUNITY): Payer: Self-pay

## 2019-04-09 ENCOUNTER — Emergency Department (HOSPITAL_COMMUNITY): Payer: Medicare HMO

## 2019-04-09 ENCOUNTER — Inpatient Hospital Stay (HOSPITAL_COMMUNITY)
Admission: EM | Admit: 2019-04-09 | Discharge: 2019-04-28 | DRG: 393 | Disposition: E | Payer: Medicare HMO | Attending: Internal Medicine | Admitting: Internal Medicine

## 2019-04-09 ENCOUNTER — Telehealth: Payer: Self-pay | Admitting: *Deleted

## 2019-04-09 ENCOUNTER — Observation Stay (HOSPITAL_COMMUNITY): Payer: Medicare HMO

## 2019-04-09 ENCOUNTER — Telehealth: Payer: Self-pay | Admitting: Oncology

## 2019-04-09 DIAGNOSIS — Z825 Family history of asthma and other chronic lower respiratory diseases: Secondary | ICD-10-CM

## 2019-04-09 DIAGNOSIS — Z79899 Other long term (current) drug therapy: Secondary | ICD-10-CM

## 2019-04-09 DIAGNOSIS — K5792 Diverticulitis of intestine, part unspecified, without perforation or abscess without bleeding: Secondary | ICD-10-CM

## 2019-04-09 DIAGNOSIS — Z96651 Presence of right artificial knee joint: Secondary | ICD-10-CM | POA: Diagnosis present

## 2019-04-09 DIAGNOSIS — R112 Nausea with vomiting, unspecified: Secondary | ICD-10-CM

## 2019-04-09 DIAGNOSIS — E1122 Type 2 diabetes mellitus with diabetic chronic kidney disease: Secondary | ICD-10-CM | POA: Diagnosis present

## 2019-04-09 DIAGNOSIS — J9601 Acute respiratory failure with hypoxia: Secondary | ICD-10-CM | POA: Diagnosis not present

## 2019-04-09 DIAGNOSIS — E872 Acidosis: Secondary | ICD-10-CM | POA: Diagnosis present

## 2019-04-09 DIAGNOSIS — Z515 Encounter for palliative care: Secondary | ICD-10-CM

## 2019-04-09 DIAGNOSIS — R140 Abdominal distension (gaseous): Secondary | ICD-10-CM

## 2019-04-09 DIAGNOSIS — E785 Hyperlipidemia, unspecified: Secondary | ICD-10-CM | POA: Diagnosis present

## 2019-04-09 DIAGNOSIS — E860 Dehydration: Secondary | ICD-10-CM | POA: Diagnosis present

## 2019-04-09 DIAGNOSIS — K7460 Unspecified cirrhosis of liver: Secondary | ICD-10-CM | POA: Diagnosis present

## 2019-04-09 DIAGNOSIS — L8990 Pressure ulcer of unspecified site, unspecified stage: Secondary | ICD-10-CM | POA: Insufficient documentation

## 2019-04-09 DIAGNOSIS — R188 Other ascites: Secondary | ICD-10-CM | POA: Diagnosis present

## 2019-04-09 DIAGNOSIS — D696 Thrombocytopenia, unspecified: Secondary | ICD-10-CM | POA: Diagnosis present

## 2019-04-09 DIAGNOSIS — R161 Splenomegaly, not elsewhere classified: Secondary | ICD-10-CM | POA: Diagnosis present

## 2019-04-09 DIAGNOSIS — Z6841 Body Mass Index (BMI) 40.0 and over, adult: Secondary | ICD-10-CM

## 2019-04-09 DIAGNOSIS — Z170 Estrogen receptor positive status [ER+]: Secondary | ICD-10-CM

## 2019-04-09 DIAGNOSIS — N179 Acute kidney failure, unspecified: Secondary | ICD-10-CM

## 2019-04-09 DIAGNOSIS — Z794 Long term (current) use of insulin: Secondary | ICD-10-CM

## 2019-04-09 DIAGNOSIS — E8809 Other disorders of plasma-protein metabolism, not elsewhere classified: Secondary | ICD-10-CM | POA: Diagnosis present

## 2019-04-09 DIAGNOSIS — C50912 Malignant neoplasm of unspecified site of left female breast: Secondary | ICD-10-CM | POA: Diagnosis present

## 2019-04-09 DIAGNOSIS — K219 Gastro-esophageal reflux disease without esophagitis: Secondary | ICD-10-CM | POA: Diagnosis present

## 2019-04-09 DIAGNOSIS — N189 Chronic kidney disease, unspecified: Secondary | ICD-10-CM | POA: Diagnosis present

## 2019-04-09 DIAGNOSIS — K766 Portal hypertension: Secondary | ICD-10-CM | POA: Diagnosis present

## 2019-04-09 DIAGNOSIS — R54 Age-related physical debility: Secondary | ICD-10-CM | POA: Diagnosis present

## 2019-04-09 DIAGNOSIS — Z7982 Long term (current) use of aspirin: Secondary | ICD-10-CM

## 2019-04-09 DIAGNOSIS — I130 Hypertensive heart and chronic kidney disease with heart failure and stage 1 through stage 4 chronic kidney disease, or unspecified chronic kidney disease: Secondary | ICD-10-CM | POA: Diagnosis present

## 2019-04-09 DIAGNOSIS — E875 Hyperkalemia: Secondary | ICD-10-CM | POA: Diagnosis not present

## 2019-04-09 DIAGNOSIS — E8770 Fluid overload, unspecified: Secondary | ICD-10-CM | POA: Diagnosis not present

## 2019-04-09 DIAGNOSIS — Z7951 Long term (current) use of inhaled steroids: Secondary | ICD-10-CM

## 2019-04-09 DIAGNOSIS — E1165 Type 2 diabetes mellitus with hyperglycemia: Secondary | ICD-10-CM | POA: Diagnosis present

## 2019-04-09 DIAGNOSIS — T451X5A Adverse effect of antineoplastic and immunosuppressive drugs, initial encounter: Secondary | ICD-10-CM | POA: Diagnosis present

## 2019-04-09 DIAGNOSIS — K521 Toxic gastroenteritis and colitis: Principal | ICD-10-CM | POA: Diagnosis present

## 2019-04-09 DIAGNOSIS — E119 Type 2 diabetes mellitus without complications: Secondary | ICD-10-CM

## 2019-04-09 DIAGNOSIS — D849 Immunodeficiency, unspecified: Secondary | ICD-10-CM | POA: Diagnosis present

## 2019-04-09 DIAGNOSIS — J479 Bronchiectasis, uncomplicated: Secondary | ICD-10-CM | POA: Diagnosis present

## 2019-04-09 DIAGNOSIS — E861 Hypovolemia: Secondary | ICD-10-CM | POA: Diagnosis present

## 2019-04-09 DIAGNOSIS — K1230 Oral mucositis (ulcerative), unspecified: Secondary | ICD-10-CM

## 2019-04-09 DIAGNOSIS — E538 Deficiency of other specified B group vitamins: Secondary | ICD-10-CM | POA: Diagnosis present

## 2019-04-09 DIAGNOSIS — K1231 Oral mucositis (ulcerative) due to antineoplastic therapy: Secondary | ICD-10-CM | POA: Diagnosis present

## 2019-04-09 DIAGNOSIS — C50812 Malignant neoplasm of overlapping sites of left female breast: Secondary | ICD-10-CM | POA: Diagnosis present

## 2019-04-09 DIAGNOSIS — J90 Pleural effusion, not elsewhere classified: Secondary | ICD-10-CM | POA: Diagnosis not present

## 2019-04-09 DIAGNOSIS — K5732 Diverticulitis of large intestine without perforation or abscess without bleeding: Secondary | ICD-10-CM | POA: Diagnosis not present

## 2019-04-09 DIAGNOSIS — Z7189 Other specified counseling: Secondary | ICD-10-CM

## 2019-04-09 DIAGNOSIS — R21 Rash and other nonspecific skin eruption: Secondary | ICD-10-CM | POA: Diagnosis present

## 2019-04-09 DIAGNOSIS — B373 Candidiasis of vulva and vagina: Secondary | ICD-10-CM | POA: Diagnosis present

## 2019-04-09 DIAGNOSIS — Z885 Allergy status to narcotic agent status: Secondary | ICD-10-CM

## 2019-04-09 DIAGNOSIS — Z4659 Encounter for fitting and adjustment of other gastrointestinal appliance and device: Secondary | ICD-10-CM

## 2019-04-09 DIAGNOSIS — N184 Chronic kidney disease, stage 4 (severe): Secondary | ICD-10-CM | POA: Diagnosis present

## 2019-04-09 DIAGNOSIS — Z9049 Acquired absence of other specified parts of digestive tract: Secondary | ICD-10-CM

## 2019-04-09 DIAGNOSIS — K567 Ileus, unspecified: Secondary | ICD-10-CM | POA: Diagnosis not present

## 2019-04-09 DIAGNOSIS — E876 Hypokalemia: Secondary | ICD-10-CM | POA: Diagnosis not present

## 2019-04-09 DIAGNOSIS — I959 Hypotension, unspecified: Secondary | ICD-10-CM | POA: Diagnosis not present

## 2019-04-09 DIAGNOSIS — E871 Hypo-osmolality and hyponatremia: Secondary | ICD-10-CM | POA: Diagnosis not present

## 2019-04-09 DIAGNOSIS — I10 Essential (primary) hypertension: Secondary | ICD-10-CM | POA: Diagnosis present

## 2019-04-09 DIAGNOSIS — Z79811 Long term (current) use of aromatase inhibitors: Secondary | ICD-10-CM

## 2019-04-09 DIAGNOSIS — D631 Anemia in chronic kidney disease: Secondary | ICD-10-CM | POA: Diagnosis present

## 2019-04-09 DIAGNOSIS — Z66 Do not resuscitate: Secondary | ICD-10-CM | POA: Diagnosis present

## 2019-04-09 DIAGNOSIS — Z20822 Contact with and (suspected) exposure to covid-19: Secondary | ICD-10-CM | POA: Diagnosis present

## 2019-04-09 DIAGNOSIS — Z8249 Family history of ischemic heart disease and other diseases of the circulatory system: Secondary | ICD-10-CM

## 2019-04-09 LAB — URINALYSIS, ROUTINE W REFLEX MICROSCOPIC
Bilirubin Urine: NEGATIVE
Glucose, UA: NEGATIVE mg/dL
Hgb urine dipstick: NEGATIVE
Ketones, ur: NEGATIVE mg/dL
Leukocytes,Ua: NEGATIVE
Nitrite: NEGATIVE
Protein, ur: NEGATIVE mg/dL
Specific Gravity, Urine: 1.015 (ref 1.005–1.030)
pH: 5 (ref 5.0–8.0)

## 2019-04-09 LAB — COMPREHENSIVE METABOLIC PANEL
ALT: 12 U/L (ref 0–44)
AST: 23 U/L (ref 15–41)
Albumin: 2.6 g/dL — ABNORMAL LOW (ref 3.5–5.0)
Alkaline Phosphatase: 63 U/L (ref 38–126)
Anion gap: 10 (ref 5–15)
BUN: 62 mg/dL — ABNORMAL HIGH (ref 8–23)
CO2: 20 mmol/L — ABNORMAL LOW (ref 22–32)
Calcium: 7.7 mg/dL — ABNORMAL LOW (ref 8.9–10.3)
Chloride: 105 mmol/L (ref 98–111)
Creatinine, Ser: 2.51 mg/dL — ABNORMAL HIGH (ref 0.44–1.00)
GFR calc Af Amer: 20 mL/min — ABNORMAL LOW (ref >60)
GFR calc non Af Amer: 17 mL/min — ABNORMAL LOW (ref >60)
Glucose, Bld: 180 mg/dL — ABNORMAL HIGH (ref 70–99)
Potassium: 3.5 mmol/L (ref 3.5–5.1)
Sodium: 135 mmol/L (ref 135–145)
Total Bilirubin: 1.3 mg/dL — ABNORMAL HIGH (ref 0.3–1.2)
Total Protein: 5.2 g/dL — ABNORMAL LOW (ref 6.5–8.1)

## 2019-04-09 LAB — CBC WITH DIFFERENTIAL/PLATELET
Abs Immature Granulocytes: 0.05 10*3/uL (ref 0.00–0.07)
Basophils Absolute: 0 10*3/uL (ref 0.0–0.1)
Basophils Relative: 0 %
Eosinophils Absolute: 0.1 10*3/uL (ref 0.0–0.5)
Eosinophils Relative: 1 %
HCT: 38 % (ref 36.0–46.0)
Hemoglobin: 13.2 g/dL (ref 12.0–15.0)
Immature Granulocytes: 1 %
Lymphocytes Relative: 15 %
Lymphs Abs: 1.5 10*3/uL (ref 0.7–4.0)
MCH: 38 pg — ABNORMAL HIGH (ref 26.0–34.0)
MCHC: 34.7 g/dL (ref 30.0–36.0)
MCV: 109.5 fL — ABNORMAL HIGH (ref 80.0–100.0)
Monocytes Absolute: 0.5 10*3/uL (ref 0.1–1.0)
Monocytes Relative: 5 %
Neutro Abs: 8.1 10*3/uL — ABNORMAL HIGH (ref 1.7–7.7)
Neutrophils Relative %: 78 %
Platelets: 110 10*3/uL — ABNORMAL LOW (ref 150–400)
RBC: 3.47 MIL/uL — ABNORMAL LOW (ref 3.87–5.11)
RDW: 15.7 % — ABNORMAL HIGH (ref 11.5–15.5)
WBC: 10.3 10*3/uL (ref 4.0–10.5)
nRBC: 0.2 % (ref 0.0–0.2)

## 2019-04-09 LAB — HEMOGLOBIN A1C
Hgb A1c MFr Bld: 11.6 % — ABNORMAL HIGH (ref 4.8–5.6)
Mean Plasma Glucose: 286.22 mg/dL

## 2019-04-09 LAB — GLUCOSE, CAPILLARY
Glucose-Capillary: 149 mg/dL — ABNORMAL HIGH (ref 70–99)
Glucose-Capillary: 265 mg/dL — ABNORMAL HIGH (ref 70–99)

## 2019-04-09 LAB — RESPIRATORY PANEL BY RT PCR (FLU A&B, COVID)
Influenza A by PCR: NEGATIVE
Influenza B by PCR: NEGATIVE
SARS Coronavirus 2 by RT PCR: NEGATIVE

## 2019-04-09 LAB — LIPASE, BLOOD: Lipase: 27 U/L (ref 11–51)

## 2019-04-09 LAB — MRSA PCR SCREENING: MRSA by PCR: NEGATIVE

## 2019-04-09 MED ORDER — ONDANSETRON HCL 4 MG PO TABS: 4.0000 mg | ORAL_TABLET | Freq: Four times a day (QID) | ORAL | Status: DC | PRN

## 2019-04-09 MED ORDER — SODIUM CHLORIDE 0.9 % IV SOLN: INTRAVENOUS | Status: DC

## 2019-04-09 MED ORDER — ONDANSETRON HCL 4 MG/2ML IJ SOLN
4.0000 mg | Freq: Four times a day (QID) | INTRAMUSCULAR | Status: DC | PRN
  Administered 2019-04-10 – 2019-04-13 (×4): 4 mg via INTRAVENOUS
  Filled 2019-04-09 (×4): qty 2

## 2019-04-09 MED ORDER — INSULIN ASPART 100 UNIT/ML ~~LOC~~ SOLN
0.0000 [IU] | Freq: Three times a day (TID) | SUBCUTANEOUS | Status: DC
  Administered 2019-04-10 (×3): 8 [IU] via SUBCUTANEOUS
  Administered 2019-04-11: 6 [IU] via SUBCUTANEOUS
  Administered 2019-04-11: 8 [IU] via SUBCUTANEOUS
  Administered 2019-04-11: 08:00:00 3 [IU] via SUBCUTANEOUS
  Administered 2019-04-12 (×3): 5 [IU] via SUBCUTANEOUS

## 2019-04-09 MED ORDER — METOPROLOL SUCCINATE ER 50 MG PO TB24
100.0000 mg | ORAL_TABLET | Freq: Every morning | ORAL | Status: DC
  Administered 2019-04-10 – 2019-04-12 (×3): 100 mg via ORAL
  Filled 2019-04-09 (×3): qty 2

## 2019-04-09 MED ORDER — FLUCONAZOLE 100 MG PO TABS
200.0000 mg | ORAL_TABLET | Freq: Every day | ORAL | Status: DC
  Administered 2019-04-09 – 2019-04-12 (×4): 200 mg via ORAL
  Filled 2019-04-09 (×5): qty 2

## 2019-04-09 MED ORDER — ATORVASTATIN CALCIUM 10 MG PO TABS
10.0000 mg | ORAL_TABLET | Freq: Every evening | ORAL | Status: DC
  Administered 2019-04-09 – 2019-04-11 (×3): 10 mg via ORAL
  Filled 2019-04-09 (×3): qty 1

## 2019-04-09 MED ORDER — METRONIDAZOLE IN NACL 5-0.79 MG/ML-% IV SOLN
500.0000 mg | Freq: Three times a day (TID) | INTRAVENOUS | Status: DC
  Administered 2019-04-09 – 2019-04-13 (×11): 500 mg via INTRAVENOUS
  Filled 2019-04-09 (×11): qty 100

## 2019-04-09 MED ORDER — IPRATROPIUM-ALBUTEROL 0.5-2.5 (3) MG/3ML IN SOLN
3.0000 mL | Freq: Three times a day (TID) | RESPIRATORY_TRACT | Status: DC
  Administered 2019-04-09 – 2019-04-16 (×18): 3 mL via RESPIRATORY_TRACT
  Filled 2019-04-09 (×19): qty 3

## 2019-04-09 MED ORDER — DORZOLAMIDE HCL 2 % OP SOLN
1.0000 [drp] | Freq: Two times a day (BID) | OPHTHALMIC | Status: DC
  Administered 2019-04-09 – 2019-04-17 (×15): 1 [drp] via OPHTHALMIC
  Filled 2019-04-09: qty 10

## 2019-04-09 MED ORDER — CIPROFLOXACIN IN D5W 400 MG/200ML IV SOLN
400.0000 mg | INTRAVENOUS | Status: DC
  Administered 2019-04-10 – 2019-04-12 (×3): 400 mg via INTRAVENOUS
  Filled 2019-04-09 (×3): qty 200

## 2019-04-09 MED ORDER — CIPROFLOXACIN IN D5W 400 MG/200ML IV SOLN
400.0000 mg | Freq: Once | INTRAVENOUS | Status: AC
  Administered 2019-04-09: 12:00:00 400 mg via INTRAVENOUS
  Filled 2019-04-09: qty 200

## 2019-04-09 MED ORDER — LATANOPROST 0.005 % OP SOLN
1.0000 [drp] | Freq: Every day | OPHTHALMIC | Status: DC
  Administered 2019-04-09 – 2019-04-17 (×8): 1 [drp] via OPHTHALMIC
  Filled 2019-04-09: qty 2.5

## 2019-04-09 MED ORDER — ONDANSETRON HCL 4 MG/2ML IJ SOLN
4.0000 mg | Freq: Once | INTRAMUSCULAR | Status: AC
  Administered 2019-04-09: 4 mg via INTRAVENOUS
  Filled 2019-04-09: qty 2

## 2019-04-09 MED ORDER — GLIPIZIDE 5 MG PO TABS: 5.0000 mg | ORAL_TABLET | Freq: Every day | ORAL | Status: DC

## 2019-04-09 MED ORDER — MIRTAZAPINE 15 MG PO TABS
15.0000 mg | ORAL_TABLET | Freq: Every day | ORAL | Status: DC
  Administered 2019-04-09 – 2019-04-12 (×4): 15 mg via ORAL
  Filled 2019-04-09 (×4): qty 1

## 2019-04-09 MED ORDER — GABAPENTIN 300 MG PO CAPS
600.0000 mg | ORAL_CAPSULE | Freq: Every day | ORAL | Status: DC
  Administered 2019-04-10 – 2019-04-12 (×3): 600 mg via ORAL
  Filled 2019-04-09 (×3): qty 2

## 2019-04-09 MED ORDER — MIRABEGRON ER 25 MG PO TB24
50.0000 mg | ORAL_TABLET | Freq: Every day | ORAL | Status: DC
  Administered 2019-04-10 – 2019-04-16 (×5): 50 mg via ORAL
  Filled 2019-04-09 (×8): qty 2

## 2019-04-09 MED ORDER — AMLODIPINE BESYLATE 5 MG PO TABS
5.0000 mg | ORAL_TABLET | Freq: Every morning | ORAL | Status: DC
  Administered 2019-04-10 – 2019-04-12 (×3): 5 mg via ORAL
  Filled 2019-04-09 (×3): qty 1

## 2019-04-09 MED ORDER — BRIMONIDINE TARTRATE 0.15 % OP SOLN
1.0000 [drp] | Freq: Two times a day (BID) | OPHTHALMIC | Status: DC
  Administered 2019-04-09 – 2019-04-17 (×15): 1 [drp] via OPHTHALMIC
  Filled 2019-04-09: qty 5

## 2019-04-09 MED ORDER — TRAMADOL HCL 50 MG PO TABS
50.0000 mg | ORAL_TABLET | Freq: Four times a day (QID) | ORAL | Status: DC | PRN
  Administered 2019-04-09 – 2019-04-16 (×7): 50 mg via ORAL
  Filled 2019-04-09 (×7): qty 1

## 2019-04-09 MED ORDER — ASPIRIN EC 81 MG PO TBEC
81.0000 mg | DELAYED_RELEASE_TABLET | Freq: Every day | ORAL | Status: DC
  Administered 2019-04-10 – 2019-04-16 (×5): 81 mg via ORAL
  Filled 2019-04-09 (×6): qty 1

## 2019-04-09 MED ORDER — CIPROFLOXACIN IN D5W 400 MG/200ML IV SOLN: 400.0000 mg | Freq: Two times a day (BID) | INTRAVENOUS | Status: DC

## 2019-04-09 MED ORDER — POLYVINYL ALCOHOL 1.4 % OP SOLN: 1.0000 [drp] | Freq: Every day | OPHTHALMIC | Status: DC | PRN

## 2019-04-09 MED ORDER — METRONIDAZOLE IN NACL 5-0.79 MG/ML-% IV SOLN
500.0000 mg | Freq: Once | INTRAVENOUS | Status: AC
  Administered 2019-04-09: 500 mg via INTRAVENOUS
  Filled 2019-04-09: qty 100

## 2019-04-09 MED ORDER — VALACYCLOVIR HCL 500 MG PO TABS
500.0000 mg | ORAL_TABLET | Freq: Two times a day (BID) | ORAL | Status: DC
  Administered 2019-04-09 – 2019-04-12 (×7): 500 mg via ORAL
  Filled 2019-04-09 (×11): qty 1

## 2019-04-09 MED ORDER — CHOLESTYRAMINE LIGHT 4 G PO PACK
4.0000 g | PACK | ORAL | Status: DC
  Administered 2019-04-10 – 2019-04-11 (×4): 4 g via ORAL
  Filled 2019-04-09 (×8): qty 1

## 2019-04-09 MED ORDER — BUDESONIDE 0.5 MG/2ML IN SUSP
2.0000 mL | Freq: Two times a day (BID) | RESPIRATORY_TRACT | Status: DC
  Administered 2019-04-09 – 2019-04-16 (×13): 0.5 mg via RESPIRATORY_TRACT
  Filled 2019-04-09 (×14): qty 2

## 2019-04-09 MED ORDER — LORAZEPAM 0.5 MG PO TABS: 0.5000 mg | ORAL_TABLET | Freq: Two times a day (BID) | ORAL | Status: DC | PRN

## 2019-04-09 NOTE — Telephone Encounter (Signed)
I talk with patients daughter regarding schedule  °

## 2019-04-09 NOTE — H&P (Signed)
History and Physical    [name] DZH:[id number] DOB: [birth year] DOA: 04/23/2019  PCP: Celene Squibb, MD   Patient coming from: Home.  I have personally briefly reviewed patient's old medical records in [place]  Chief Complaint: Nausea, vomiting and diarrhea.  HPI: [name] is a 86 y.o. female with medical history significant of anemia, osteoarthritis, left breast cancer, cataracts, chronic kidney disease, cirrhosis, type 2 diabetes, GERD, hyperlipidemia, hypertension, history of multiple falls in the past, nocturia, seasonal allergies, history of facial melanoma with surgical excision in 2012 who is coming to the emergency department due to multiple episodes of nausea, vomiting and diarrhea following her chemotherapy cycle this week.  Denies melena or hematochezia.  No dysuria, frequency or hematuria.  She has had low-grade fevers since Saturday according to her daughter.  She has had decreased oral intake for several days.  No headache, sore throat, rhinorrhea, wheezing or hemoptysis.  Denies chest pain, palpitations, diaphoresis, PND orthopnea or recent lower extremity edema.  Denies polyuria, polydipsia, polyphagia or blurred vision.  ED Course: Initial vital signs temperature 97.7 F, pulse 73, respiration 20, blood pressure 119/69 mmHg and O2 sat 95% on room air.  Patient received Zofran in the emergency department.  Her urinalysis was normal.  CBC showed a white count of 10.3, hemoglobin 13.2 g/dL and platelets of 110.  CMP shows a CO2 of 20 mmol/L, the rest of the electrolytes are within expected range after calcium is corrected to albumin.  BUN was 62, creatinine 2.51, glucose 180 and total bilirubin 1.3 mg/dL.  Total protein is 5.2 and albumin is 2.6 g/dL.  Transaminases and lipase are normal.  COVID-19 and influenza a and B by PCR was negative.  Imaging: Chest radiograph did not have any acute cardiopulmonary disease shows stable bronchiectasis of the right lower  lobe.  CT abdomen without contrast show sigmoid diverticulitis without an axis.  There is hepatic cirrhosis with associated portal hypertension and mild splenomegaly.  Review of Systems: As per HPI otherwise 10 point review of systems negative.   Past Medical History:  Diagnosis Date  . Abrasion of arm, left    secondary to fall   . Anemia   . Arthritis   . Breast cancer, left ([place])    er/pr +  . Cataract    no surgery as yet,starting of 3 years  . Chronic kidney disease    renal stones, one episode of Lithotripsy  . Cirrhosis (HCC)    Metavir score 4  . Complication of anesthesia   . Diabetes mellitus   . GERD (gastroesophageal reflux disease)   . Hyperlipidemia   . Hypertension    followed by [facility] relative to  urology study grp.  Doesn't report ever having a stress test   . Mental disorder   . Multiple falls   . Nocturia   . PONV (postoperative nausea and vomiting)   . Seasonal allergies   . Skin cancer    face- melanoma, 2012- surg. excision      Past Surgical History:  Procedure Laterality Date  . ABDOMINAL HYSTERECTOMY    . BACK SURGERY     x4 back surgery to include rods & screws  . Last in May 2014  . BREAST LUMPECTOMY WITH NEEDLE LOCALIZATION AND AXILLARY SENTINEL LYMPH NODE BX  03/12/2012   Procedure: BREAST LUMPECTOMY WITH NEEDLE LOCALIZATION AND AXILLARY SENTINEL LYMPH NODE BX;  Surgeon: Merrie Roof, MD;  Location: [facility];  Service: General;  Laterality: Left;  . BREAST SURGERY     lumpectomy x2/left   . CHOLECYSTECTOMY    . COLONOSCOPY N/A 11/20/2013   Dr.Rourk- diverticula was found in the left colon. o/w normal rectal, colonic and terminal ileal mucosa  . ESOPHAGOGASTRODUODENOSCOPY N/A 11/20/2013   Dr.Rourk- abnormal mucosa was found. diffuse snake skinning and friability of the gastric mucosa. patent pylorus. abnormal-appearing first, second and third portion of the duodenum. 19mm gastric nodule at the GE junction. stomach bx= minimal chronic  infammation, GE junction bx= polypoid fragments of gastric cardia type mucosa w/mod chronic inflammation and foveolar hyperplasia  . EYE SURGERY     cataract surgery bilat   . FRACTURE SURGERY     1975- [facility]- L ankle   . GIVENS CAPSULE STUDY N/A 02/24/2014   Procedure: GIVENS CAPSULE STUDY;  Surgeon: Daneil Dolin, MD;  [facility];  Service: Endoscopy;  Laterality: N/A;  . RE-EXCISION OF BREAST LUMPECTOMY  03/22/2012   Procedure: RE-EXCISION OF BREAST LUMPECTOMY;  Surgeon: Merrie Roof, MD;  Location: [facility];  Service: General;  Laterality: Left;  re-excision left breast lumpectomy cavity  ER+, PR+  . ROTATOR CUFF REPAIR Bilateral   . SHOULDER SURGERY    . SKIN BIOPSY    . TOTAL KNEE ARTHROPLASTY Right 12/18/2014   Procedure: RIGHT TOTAL KNEE ARTHROPLASTY;  Surgeon: Sydnee Cabal, MD;  Location: [facility];  Service: Orthopedics;  Laterality: Right;  . TUBAL LIGATION       reports that she has never smoked. She has never used smokeless tobacco. She reports that she does not drink alcohol or use drugs.  Allergies  Allergen Reactions  . Bee Venom Anaphylaxis and Swelling  . Hydromorphone Hcl Other (See Comments)    Agitation, Confusion  . Demerol Nausea Only    Family History  Problem Relation Age of Onset  . Dementia Mother   . Heart disease Father   . Emphysema Father   . Cancer Brother        metastatic, unsure primary  . Stroke Sister   . CAD Brother   . Colon cancer Neg Hx   . Prostate cancer Neg Hx    Prior to Admission medications   Medication Sig Start Date End Date Taking? Authorizing Provider  ALPHAGAN P 0.1 % SOLN Place 1 drop into both eyes 2 (two) times daily.  08/01/16  Yes [provider]  amLODipine (NORVASC) 5 MG tablet Take 5 mg by mouth every morning.    Yes [provider]  aspirin EC 81 MG tablet Take 81 mg by mouth daily.   Yes [provider]  atorvastatin (LIPITOR) 10 MG tablet Take 10 mg by  mouth every evening.    Yes [provider]  Bacillus Coagulans-Inulin (PROBIOTIC FORMULA PO) Take 1 capsule by mouth daily.   Yes [provider]  capecitabine (XELODA) 500 MG tablet Take 3 tablets in AM, 2 tablets in PM, Take for 14 days, then hold for 7 days. Repeat every 21 days. Patient taking differently: Take 1,000-1,500 mg by mouth See admin instructions. Take 3 tablets in AM, 2 tablets in PM, Take for 14 days, then hold for 14 days days, then determine if they will repeat. 03/18/19  Yes Magrinat, Virgie Dad, MD  cyanocobalamin (,VITAMIN B-12,) 1000 MCG/ML injection Inject 1,000 mcg into the muscle every 30 (thirty) days.  05/14/17  Yes [provider]  dorzolamide (TRUSOPT) 2 % ophthalmic solution Place 1 drop into both eyes  2 (two) times daily.    Yes [provider]  EPIPEN 2-PAK 0.3 MG/0.3ML SOAJ injection Inject 0.3 mg into the muscle as needed for anaphylaxis.    Yes [provider]  FLOVENT HFA 110 MCG/ACT inhaler Inhale 1 puff into the lungs 2 (two) times daily.  02/10/19  Yes [provider]  gabapentin (NEURONTIN) 600 MG tablet Take 600 mg by mouth daily.    Yes [provider]  glipiZIDE (GLUCOTROL) 5 MG tablet Take 5 mg by mouth daily before breakfast.  07/31/16  Yes [provider]  ipratropium-albuterol (DUONEB) 0.5-2.5 (3) MG/3ML SOLN Take 3 mLs by nebulization 3 (three) times daily. 04/23/18  Yes Sinda Du, MD  latanoprost (XALATAN) 0.005 % ophthalmic solution Place 1 drop into both eyes at bedtime.  01/17/18  Yes [provider]  LORazepam (ATIVAN) 0.5 MG tablet Take 0.5 mg by mouth 2 (two) times daily.   Yes [provider]  metoprolol succinate (TOPROL-XL) 100 MG 24 hr tablet Take 100 mg by mouth every morning. Take with or immediately following a meal.   Yes [provider]  mirtazapine (REMERON) 15 MG tablet Take 15 mg by mouth at bedtime.  08/04/15  Yes [provider]    MYRBETRIQ 50 MG TB24 tablet Take 50 mg by mouth daily.  01/15/18  Yes [provider]  NOVOLOG FLEXPEN 100 UNIT/ML FlexPen Inject 0-18 Units into the skin See admin instructions. 0-18 units sliding scale breakfast, lunch, dinner 14 units at bedtime if sugar is over 400 03/26/19  Yes [provider]  Polyethyl Glycol-Propyl Glycol (SYSTANE ULTRA) 0.4-0.3 % SOLN Place 1-2 drops into both eyes daily as needed (for itching eyes).   Yes [provider]  torsemide (DEMADEX) 20 MG tablet Take 1 tablet (20 mg total) by mouth daily. 08/21/17  Yes Sinda Du, MD  traMADol (ULTRAM) 50 MG tablet Take 50 mg by mouth every 6 (six) hours as needed for moderate pain.  03/13/19  Yes [provider]  TRESIBA FLEXTOUCH 100 UNIT/ML SOPN FlexTouch Pen Inject 0.5 mLs (50 Units total) into the skin daily. 04/23/18  Yes Sinda Du, MD  BD PEN NEEDLE NANO U/F 32G X 4 MM MISC  06/21/18   [provider]  cholestyramine (QUESTRAN) 4 g packet Take 1 packet (4 g total) by mouth 2 (two) times daily. 04/08/19   Gardenia Phlegm, NP  fluconazole (DIFLUCAN) 200 MG tablet Take 1 tablet (200 mg total) by mouth daily. 04/08/19   Gardenia Phlegm, NP  Lancets (ONETOUCH DELICA PLUS [id number]) [place]  06/18/18   [provider]  [facility] ULTRA test strip  08/23/18   [provider]  valACYclovir (VALTREX) 500 MG tablet Take 1 tablet (500 mg total) by mouth 2 (two) times daily. 04/08/19   Gardenia Phlegm, NP    Physical Exam: Vitals:   04/06/2019 0920 04/23/2019 1100 04/03/2019 1322 04/23/2019 1330  BP: 119/69 108/64 113/62 (!) 113/59  Pulse: 73 70 62 61  Resp: 20 15 16  (!) 24  Temp: 97.7 F (36.5 C)     TempSrc: Oral     SpO2: 95% 93% 94% 96%    Constitutional: NAD, calm, comfortable Eyes: PERRL, lids and conjunctivae normal ENMT: Mucous membranes and lips are dry.  Posterior pharynx clear of any exudate or lesions. Neck: normal, supple, no masses,  no thyromegaly Respiratory: clear to auscultation bilaterally, no wheezing, no crackles. Normal respiratory effort. No accessory muscle use.  Cardiovascular: Regular rate and  rhythm, no murmurs / rubs / gallops. No extremity edema. 2+ pedal pulses. No carotid bruits.  Abdomen: Nondistended, obese.  BS positive.  Positive epigastric and LUQ tenderness, no masses palpated. No hepatosplenomegaly. Musculoskeletal: Generalized weakness.  No clubbing / cyanosis.. Good ROM, no contractures. Normal muscle tone.  Skin: Multiple small areas of ecchymosis and erythema on extremities. Neurologic: CN 2-12 grossly intact. Sensation intact, DTR normal.  Nonfocal weakness.[name] Kitchen  Psychiatric: Normal judgment and insight. Alert and oriented x 3. Normal mood.   Labs on Admission: I have personally reviewed following labs and imaging studies  CBC: Recent Labs  Lab 04/08/19 1420 04/13/2019 1035  WBC 8.3 10.3  NEUTROABS 6.3 8.1*  HGB 11.1* 13.2  HCT 32.4* 38.0  MCV 109.1* 109.5*  PLT 93* 850*   Basic Metabolic Panel: Recent Labs  Lab 04/08/19 1420 04/06/2019 1035  NA 133* 135  K 3.3* 3.5  CL 102 105  CO2 21* 20*  GLUCOSE 339* 180*  BUN 56* 62*  CREATININE 2.49* 2.51*  CALCIUM 7.4* 7.7*   GFR: Estimated Creatinine Clearance: 17.2 mL/min (A) (by C-G formula based on SCr of 2.51 mg/dL (H)). Liver Function Tests: Recent Labs  Lab 04/08/19 1420 03/31/2019 1035  AST 14* 23  ALT 8 12  ALKPHOS 57 63  BILITOT 0.9 1.3*  PROT 4.4* 5.2*  ALBUMIN 2.3* 2.6*   Recent Labs  Lab 04/06/2019 1035  LIPASE 27   No results for input(s): AMMONIA in the last 168 hours. Coagulation Profile: No results for input(s): INR, PROTIME in the last 168 hours. Cardiac Enzymes: No results for input(s): CKTOTAL, CKMB, CKMBINDEX, TROPONINI in the last 168 hours. BNP (last 3 results) No results for input(s): PROBNP in the last 8760 hours. HbA1C: No results for input(s): HGBA1C in the last 72 hours. CBG: No results for  input(s): GLUCAP in the last 168 hours. Lipid Profile: No results for input(s): CHOL, HDL, LDLCALC, TRIG, CHOLHDL, LDLDIRECT in the last 72 hours. Thyroid Function Tests: No results for input(s): TSH, T4TOTAL, FREET4, T3FREE, THYROIDAB in the last 72 hours. Anemia Panel: No results for input(s): VITAMINB12, FOLATE, FERRITIN, TIBC, IRON, RETICCTPCT in the last 72 hours. Urine analysis:    Component Value Date/Time   COLORURINE YELLOW 04/07/2019 1055   APPEARANCEUR CLEAR 04/05/2019 1055   LABSPEC 1.015 04/12/2019 1055   PHURINE 5.0 04/22/2019 1055   GLUCOSEU NEGATIVE 04/03/2019 1055   HGBUR NEGATIVE 04/27/2019 1055   BILIRUBINUR NEGATIVE 04/17/2019 1055   KETONESUR NEGATIVE 04/03/2019 1055   PROTEINUR NEGATIVE 03/31/2019 1055   UROBILINOGEN 1.0 12/11/2014 1106   NITRITE NEGATIVE 04/13/2019 1055   LEUKOCYTESUR NEGATIVE 04/22/2019 1055    Radiological Exams on Admission: CT ABDOMEN PELVIS WO CONTRAST  Result Date: 04/27/2019 CLINICAL DATA:  Generalized abdominal pain. EXAM: CT ABDOMEN AND PELVIS WITHOUT CONTRAST TECHNIQUE: Multidetector CT imaging of the abdomen and pelvis was performed following the standard protocol without IV contrast due to renal insufficiency. COMPARISON:  August 29th 2020. FINDINGS: Lower chest: No acute abnormality. Hepatobiliary: Status post cholecystectomy. No biliary dilatation is noted. Nodular hepatic contours are noted consistent with hepatic cirrhosis. No focal hepatic abnormality is noted on these unenhanced images. Pancreas: Unremarkable. No pancreatic ductal dilatation or surrounding inflammatory changes. Spleen: Stable 17 mm low density is noted in anterior portion of the spleen. Stable mild splenomegaly is noted. Adrenals/Urinary Tract: Adrenal glands appear normal. Left renal atrophy is noted. No hydronephrosis or renal obstruction is noted. No renal or ureteral calculi are noted. Urinary bladder is unremarkable. Stomach/Bowel:  The stomach appears normal.  There is no evidence of bowel obstruction. The appendix is not clearly visualized, but no inflammation is noted in the right lower quadrant. However, diverticulosis is noted involving the sigmoid colon with surrounding inflammation consistent with diverticulitis. Vascular/Lymphatic: Atherosclerosis of abdominal aorta is noted without aneurysm formation. No significant adenopathy is noted. Multiple collateral veins are noted in the retroperitoneal region consistent with portal hypertension. Reproductive: Status post hysterectomy. No adnexal masses. Other: No abdominal wall hernia or abnormality. No abdominopelvic ascites. Musculoskeletal: Extensive postsurgical and degenerative changes are noted in the lumbar spine. No acute abnormality is noted. IMPRESSION: 1. Findings consistent with sigmoid diverticulitis. No abscess is noted. 2. Findings consistent with hepatic cirrhosis with associated portal hypertension and mild splenomegaly. 3. Left renal atrophy is noted. No hydronephrosis or renal obstruction is noted. 4. Aortic atherosclerosis. Aortic Atherosclerosis (ICD10-I70.0). Electronically Signed   By: Marijo Conception M.D.   On: 04/08/2019 10:42   DG Chest Portable 1 View  Result Date: 04/04/2019 CLINICAL DATA:  86 year old with current history of recurrent estrogen receptor positive breast cancer for which the patient is undergoing chemotherapy, presenting with acute onset of myalgias, nausea and diarrhea that began yesterday. EXAM: PORTABLE CHEST 1 VIEW COMPARISON:  CT chest 10/26/2018. Chest x-rays 10/26/2018 and earlier. FINDINGS: Cardiac silhouette upper normal in size for [facility] portable technique, unchanged. Thoracic aorta atherosclerotic, unchanged. Prominent central RIGHT pulmonary vessels, unchanged. Bronchiectasis involving the RIGHT LOWER LOBE, unchanged. Lungs clear. No confluent or ground-glass airspace consolidation. Normal pulmonary vascularity. No visible pleural effusions. IMPRESSION: No acute  cardiopulmonary disease. Stable bronchiectasis involving the RIGHT LOWER LOBE. Electronically Signed   By: Evangeline Dakin M.D.   On: 04/25/2019 09:44    EKG: Independently reviewed.   Assessment/Plan Principal Problem:   Sigmoid diverticulitis Observation/telemetry. Told patient that may take longer than 1 day. Analgesics as needed. Antiemetics as needed. Gentle and time-limited IV hydration. Ciprofloxacin 400 mg IVPB every 24 hours. Flagyl 500 mg IVPB every 8 hours. Follow-up CBC and CMP.  Active Problems:   HTN (hypertension) Continue amlodipine 5 mg p.o. daily. Continue metoprolol 100 mg p.o. daily. Monitor BP and heart rate.    DM type 2 (diabetes mellitus, type 2) (HCC) Poor appetite the last few days. Clear liquid diet. Continue glipizide. Hold long-acting insulin. CBG monitoring with RI SS.    Hyperlipidemia Continue atorvastatin 10 mg p.o. daily.    Chronic kidney disease Monitor renal function electrolytes.    GERD (gastroesophageal reflux disease) Continue PPI.    Recurrent breast adenocarcinoma, left ([place]) Treatment and follow-up per oncology.    Cirrhosis ([place]) No signs of decompensation. Monitor LFTs.   DVT prophylaxis: SCDs Code Status: Full code. Family Communication: I spoke to her daughter [name] over the phone. Disposition Plan: Observation for IV hydration IV antibiotics Consults called:  Admission status: Observation/MedSurg.   Reubin Milan MD [facility]  If 7PM-7AM, please contact night-coverage [email or web address]  04/15/2019, 3:33 PM   This document was prepared using Dragon voice recognition software and may contain some unintended transcription errors.

## 2019-04-09 NOTE — Telephone Encounter (Signed)
VM left by the patient's daughter, [name], who states post visit yesterday with IVF - pt continued with diarrhea during the night- and this AM began vomiting.  They contacted the on call nurses who recommended to call EMS- EMS took the patient to [facility] ER.  Pt is presently in the ER.  This note will be forward to providers for review of information.  Return call number for [name] is her cell of [phone]. Her home number is [phone].

## 2019-04-09 NOTE — ED Provider Notes (Signed)
[facility] DEPT Provider Note   CSN: [id number] Arrival date & time: 04/08/2019  3419     History Chief Complaint  Patient presents with  . Diarrhea  . Emesis    [name] is a 86 y.o. female.  HPI    Patient presents with concern of nausea, vomiting, diarrhea, weakness. Patient has history of multiple medical issues, and notably had breast cancer. She received an infusion of therapy yesterday.  She notes that she was fine during that process, but soon after returning home she felt weak, nauseous. Since about that time she has had multiple episodes of loose stool, and in the past 2 hours bouts of emesis as well. No syncope, no pain anywhere including her chest or abdomen, no fever, no confusion.  No relief with anything and symptoms seem to be worse with motion.  EMS providers note the patient was awake, alert, hemodynamically unremarkable in route.  Past Medical History:  Diagnosis Date  . Abrasion of arm, left    secondary to fall   . Anemia   . Arthritis   . Breast cancer, left ([place])    er/pr +  . Cataract    no surgery as yet,starting of 3 years  . Chronic kidney disease    renal stones, one episode of Lithotripsy  . Cirrhosis (HCC)    Metavir score 4  . Complication of anesthesia   . Diabetes mellitus   . GERD (gastroesophageal reflux disease)   . Hyperlipidemia   . Hypertension    followed by [facility]. relative to  urology study grp.  Doesn't report ever having a stress test   . Mental disorder   . Multiple falls   . Nocturia   . PONV (postoperative nausea and vomiting)   . Seasonal allergies   . Skin cancer    face- melanoma, 2012- surg. excision      Patient Active Problem List   Diagnosis Date Noted  . Cirrhosis ([place]) 11/25/2018  . Community acquired pneumonia 10/27/2018  . Fever 10/26/2018  . Acute Respiratory failure with Hypoxia ([place]) 10/26/2018  . Acute hypoxemic respiratory failure ([place]) 04/15/2018  .  Acute on chronic diastolic CHF (congestive heart failure) ([place]) 04/15/2018  . Malignant neoplasm of overlapping sites of left breast in female, estrogen receptor positive ([place]) 04/03/2018  . Recurrent breast adenocarcinoma, left ([place]) 04/03/2018  . Altered mental status 08/18/2017  . Acute encephalopathy 08/17/2017  . Acute Metabolic Encephalopathy acute 08/17/2017  . Encephalopathy 07/25/2017  . Diabetes mellitus ([place]) 09/02/2015  . Anemia due to multiple mechanisms 02/17/2015  . Chronic LBP 02/17/2015  . Impaired renal function 02/17/2015  . Abducens nerve weakness 02/17/2015  . S/P knee replacement 12/18/2014  . Osteoarthritis of right knee 12/18/2014  . IDA (iron deficiency anemia) 10/29/2013  . Vitamin B12 deficiency anemia 09/09/2013  . Anemia, iron deficiency 09/09/2013  . Lumbago 08/06/2013  . Difficulty in walking(719.7) 08/06/2013  . Osteopenia 03/11/2013  . Malignant neoplasm of lower-inner quadrant of left breast in female, estrogen receptor positive ([place]) 03/10/2013  . Bilateral leg weakness 09/23/2012  . Acute renal failure ([place]) 07/25/2012  . Dehydration 07/25/2012  . Non-insulin dependent type 2 diabetes mellitus ([place]) 07/25/2012  . DM (diabetes mellitus), type 2, uncontrolled ([place]) 07/25/2012  . Essential hypertension, benign 07/25/2012  . Generalized weakness 07/25/2012  . Hyperlipidemia   . Chronic kidney disease   . Hypertension   . GERD (gastroesophageal reflux disease)   . CAP (community acquired pneumonia) 04/19/2011  .  Nausea and vomiting 04/19/2011  . HTN (hypertension) 04/19/2011  . DM type 2 (diabetes mellitus, type 2) ([place]) 04/19/2011  . Arthritis 04/19/2011    Past Surgical History:  Procedure Laterality Date  . ABDOMINAL HYSTERECTOMY    . BACK SURGERY     x4 back surgery to include rods & screws  . Last in May 2014  . BREAST LUMPECTOMY WITH NEEDLE LOCALIZATION AND AXILLARY SENTINEL LYMPH NODE BX  03/12/2012   Procedure: BREAST LUMPECTOMY WITH  NEEDLE LOCALIZATION AND AXILLARY SENTINEL LYMPH NODE BX;  Surgeon: Merrie Roof, MD;  Location: [facility];  Service: General;  Laterality: Left;  . BREAST SURGERY     lumpectomy x2/left   . CHOLECYSTECTOMY    . COLONOSCOPY N/A 11/20/2013   Dr.Rourk- diverticula was found in the left colon. o/w normal rectal, colonic and terminal ileal mucosa  . ESOPHAGOGASTRODUODENOSCOPY N/A 11/20/2013   Dr.Rourk- abnormal mucosa was found. diffuse snake skinning and friability of the gastric mucosa. patent pylorus. abnormal-appearing first, second and third portion of the duodenum. 70mm gastric nodule at the GE junction. stomach bx= minimal chronic infammation, GE junction bx= polypoid fragments of gastric cardia type mucosa w/mod chronic inflammation and foveolar hyperplasia  . EYE SURGERY     cataract surgery bilat   . FRACTURE SURGERY     1975- ORIF- L ankle   . GIVENS CAPSULE STUDY N/A 02/24/2014   Procedure: GIVENS CAPSULE STUDY;  Surgeon: Daneil Dolin, MD;  [facility];  Service: Endoscopy;  Laterality: N/A;  . RE-EXCISION OF BREAST LUMPECTOMY  03/22/2012   Procedure: RE-EXCISION OF BREAST LUMPECTOMY;  Surgeon: Merrie Roof, MD;  Location: [facility];  Service: General;  Laterality: Left;  re-excision left breast lumpectomy cavity  ER+, PR+  . ROTATOR CUFF REPAIR Bilateral   . SHOULDER SURGERY    . SKIN BIOPSY    . TOTAL KNEE ARTHROPLASTY Right 12/18/2014   Procedure: RIGHT TOTAL KNEE ARTHROPLASTY;  Surgeon: Sydnee Cabal, MD;  Location: [facility];  Service: Orthopedics;  Laterality: Right;  . TUBAL LIGATION       OB History    Gravida  2   Para  2   Term  2   Preterm      AB      Living  2     SAB      TAB      Ectopic      Multiple      Live Births              Family History  Problem Relation Age of Onset  . Dementia Mother   . Heart disease Father   . Emphysema Father   . Cancer Brother        metastatic, unsure primary  . Stroke Sister     . CAD Brother   . Colon cancer Neg Hx   . Prostate cancer Neg Hx     Social History   Tobacco Use  . Smoking status: Never Smoker  . Smokeless tobacco: Never Used  Substance Use Topics  . Alcohol use: No  . Drug use: No    Home Medications Prior to Admission medications   Medication Sig Start Date End Date Taking? Authorizing Provider  ALPHAGAN P 0.1 % SOLN Place 1 drop into both eyes 2 (two) times daily.  08/01/16   [provider]  amLODipine (NORVASC) 5 MG tablet Take 5 mg by mouth every morning.  [provider]  aspirin EC 81 MG tablet Take 81 mg by mouth daily.    [provider]  atorvastatin (LIPITOR) 10 MG tablet Take 10 mg by mouth every evening.     [provider]  Bacillus Coagulans-Inulin (PROBIOTIC FORMULA PO) Take 1 capsule by mouth daily.    [provider]  BD PEN NEEDLE NANO U/F 32G X 4 MM MISC  06/21/18   [provider]  capecitabine (XELODA) 500 MG tablet Take 3 tablets in AM, 2 tablets in PM, Take for 14 days, then hold for 7 days. Repeat every 21 days. 03/18/19   Magrinat, Virgie Dad, MD  cholestyramine Lucrezia Starch) 4 g packet Take 1 packet (4 g total) by mouth 2 (two) times daily. 04/08/19   Causey, Charlestine Massed, NP  cyanocobalamin (,VITAMIN B-12,) 1000 MCG/ML injection Inject 1,000 mcg into the muscle every 30 (thirty) days.  05/14/17   [provider]  dorzolamide (TRUSOPT) 2 % ophthalmic solution Place 1 drop into both eyes 2 (two) times daily.     [provider]  EPIPEN 2-PAK 0.3 MG/0.3ML SOAJ injection Inject 0.3 mg into the muscle as needed for anaphylaxis.     [provider]  FLOVENT [facility] 110 MCG/ACT inhaler  02/10/19   [provider]  fluconazole (DIFLUCAN) 200 MG tablet Take 1 tablet (200 mg total) by mouth daily. 04/08/19   Gardenia Phlegm, NP  gabapentin (NEURONTIN) 600 MG tablet Take 600 mg by mouth daily.     [provider]  glipiZIDE  (GLUCOTROL) 5 MG tablet Take 5 mg by mouth daily before breakfast.  07/31/16   [provider]  ipratropium-albuterol (DUONEB) 0.5-2.5 (3) MG/3ML SOLN Take 3 mLs by nebulization 3 (three) times daily. 04/23/18   Sinda Du, MD  Lancets (ONETOUCH DELICA PLUS XIPJAS50N) [place]  06/18/18   [provider]  latanoprost (XALATAN) 0.005 % ophthalmic solution Place 1 drop into both eyes at bedtime.  01/17/18   [provider]  LORazepam (ATIVAN) 0.5 MG tablet Take 0.5 mg by mouth 2 (two) times daily.    [provider]  metoprolol succinate (TOPROL-XL) 100 MG 24 hr tablet Take 100 mg by mouth every morning. Take with or immediately following a meal.    [provider]  mirtazapine (REMERON) 15 MG tablet Take 15 mg by mouth at bedtime.  08/04/15   [provider]  MYRBETRIQ 50 MG TB24 tablet Take 50 mg by mouth daily.  01/15/18   [provider]  NOVOLOG FLEXPEN 100 UNIT/ML FlexPen  03/26/19   [provider]  [facility] ULTRA test strip  08/23/18   [provider]  Polyethyl Glycol-Propyl Glycol (SYSTANE ULTRA) 0.4-0.3 % SOLN Place 1-2 drops into both eyes daily as needed (for itching eyes).    [provider]  torsemide (DEMADEX) 20 MG tablet Take 1 tablet (20 mg total) by mouth daily. 08/21/17   Sinda Du, MD  traMADol (ULTRAM) 50 MG tablet Take 50 mg by mouth every 6 (six) hours as needed. 03/13/19   [provider]  TRESIBA FLEXTOUCH 100 UNIT/ML SOPN FlexTouch Pen Inject 0.5 mLs (50 Units total) into the skin daily. 04/23/18   Sinda Du, MD  valACYclovir (VALTREX) 500 MG tablet Take 1 tablet (500 mg total) by mouth 2 (two) times daily. 04/08/19   Gardenia Phlegm, NP    Allergies    Bee venom, Hydromorphone hcl, and Demerol  Review of Systems   Review of Systems  Constitutional:       Per HPI, otherwise negative  HENT:       Per HPI, otherwise negative  Respiratory:       Per HPI,  otherwise negative  Cardiovascular:       Per HPI, otherwise negative  Gastrointestinal: Positive for diarrhea, nausea and vomiting. Negative for abdominal pain.  Endocrine:       Negative aside from HPI  Genitourinary:       Neg aside from HPI   Musculoskeletal:       Per HPI, otherwise negative  Skin: Negative.   Allergic/Immunologic: Positive for immunocompromised state.  Neurological: Positive for weakness. Negative for syncope.    Physical Exam Updated Vital Signs BP 108/64   Pulse 70   Temp 97.7 F (36.5 C) (Oral)   Resp 15   SpO2 93%   Physical Exam Vitals and nursing note reviewed.  Constitutional:      General: She is not in acute distress.    Appearance: She is well-developed.  HENT:     Head: Normocephalic and atraumatic.  Eyes:     Conjunctiva/sclera: Conjunctivae normal.  Cardiovascular:     Rate and Rhythm: Normal rate and regular rhythm.     Pulses: Normal pulses.  Pulmonary:     Effort: Pulmonary effort is normal. No respiratory distress.     Breath sounds: No stridor.  Abdominal:     General: There is no distension.     Tenderness: There is no abdominal tenderness. There is no guarding.  Skin:    General: Skin is warm and dry.  Neurological:     Mental Status: She is alert and oriented to person, place, and time.     Cranial Nerves: No cranial nerve deficit.     ED Results / Procedures / Treatments   Labs (all labs ordered are listed, but only abnormal results are displayed) Labs Reviewed  COMPREHENSIVE METABOLIC PANEL - Abnormal; Notable for the following components:      Result Value   CO2 20 (*)    Glucose, Bld 180 (*)    BUN 62 (*)    Creatinine, Ser 2.51 (*)    Calcium 7.7 (*)    Total Protein 5.2 (*)    Albumin 2.6 (*)    Total Bilirubin 1.3 (*)    GFR calc non Af Amer 17 (*)    GFR calc Af Amer 20 (*)    All other components within normal limits  CBC WITH DIFFERENTIAL/PLATELET - Abnormal; Notable for the following components:     RBC 3.47 (*)    MCV 109.5 (*)    MCH 38.0 (*)    RDW 15.7 (*)    Platelets 110 (*)    Neutro Abs 8.1 (*)    All other components within normal limits  RESPIRATORY PANEL BY RT PCR (FLU A&B, COVID)  LIPASE, BLOOD  URINALYSIS, ROUTINE W REFLEX MICROSCOPIC    EKG None  Radiology CT ABDOMEN PELVIS WO CONTRAST  Result Date: 04/08/2019 CLINICAL DATA:  Generalized abdominal pain. EXAM: CT ABDOMEN AND PELVIS WITHOUT CONTRAST TECHNIQUE: Multidetector CT imaging of the abdomen and pelvis was performed following the standard protocol without IV contrast due to renal insufficiency. COMPARISON:  August 29th 2020. FINDINGS: Lower chest: No acute abnormality. Hepatobiliary: Status post cholecystectomy. No biliary dilatation is noted. Nodular hepatic contours are noted consistent with hepatic cirrhosis. No focal hepatic abnormality is noted on these unenhanced images. Pancreas: Unremarkable. No pancreatic ductal dilatation or surrounding inflammatory changes.  Spleen: Stable 17 mm low density is noted in anterior portion of the spleen. Stable mild splenomegaly is noted. Adrenals/Urinary Tract: Adrenal glands appear normal. Left renal atrophy is noted. No hydronephrosis or renal obstruction is noted. No renal or ureteral calculi are noted. Urinary bladder is unremarkable. Stomach/Bowel: The stomach appears normal. There is no evidence of bowel obstruction. The appendix is not clearly visualized, but no inflammation is noted in the right lower quadrant. However, diverticulosis is noted involving the sigmoid colon with surrounding inflammation consistent with diverticulitis. Vascular/Lymphatic: Atherosclerosis of abdominal aorta is noted without aneurysm formation. No significant adenopathy is noted. Multiple collateral veins are noted in the retroperitoneal region consistent with portal hypertension. Reproductive: Status post hysterectomy. No adnexal masses. Other: No abdominal wall hernia or abnormality. No  abdominopelvic ascites. Musculoskeletal: Extensive postsurgical and degenerative changes are noted in the lumbar spine. No acute abnormality is noted. IMPRESSION: 1. Findings consistent with sigmoid diverticulitis. No abscess is noted. 2. Findings consistent with hepatic cirrhosis with associated portal hypertension and mild splenomegaly. 3. Left renal atrophy is noted. No hydronephrosis or renal obstruction is noted. 4. Aortic atherosclerosis. Aortic Atherosclerosis (ICD10-I70.0). Electronically Signed   By: Marijo Conception M.D.   On: 04/11/2019 10:42   DG Chest Portable 1 View  Result Date: 04/19/2019 CLINICAL DATA:  86 year old with current history of recurrent estrogen receptor positive breast cancer for which the patient is undergoing chemotherapy, presenting with acute onset of myalgias, nausea and diarrhea that began yesterday. EXAM: PORTABLE CHEST 1 VIEW COMPARISON:  CT chest 10/26/2018. Chest x-rays 10/26/2018 and earlier. FINDINGS: Cardiac silhouette upper normal in size for [facility] portable technique, unchanged. Thoracic aorta atherosclerotic, unchanged. Prominent central RIGHT pulmonary vessels, unchanged. Bronchiectasis involving the RIGHT LOWER LOBE, unchanged. Lungs clear. No confluent or ground-glass airspace consolidation. Normal pulmonary vascularity. No visible pleural effusions. IMPRESSION: No acute cardiopulmonary disease. Stable bronchiectasis involving the RIGHT LOWER LOBE. Electronically Signed   By: Evangeline Dakin M.D.   On: 04/21/2019 09:44    Procedures Procedures (including critical care time)  Medications Ordered in ED Medications  0.9 %  sodium chloride infusion ( Intravenous Bolus from Bag 04/02/2019 0952)  ciprofloxacin (CIPRO) IVPB 400 mg (400 mg Intravenous New Bag/Given 04/04/2019 1150)  metroNIDAZOLE (FLAGYL) IVPB 500 mg (500 mg Intravenous New Bag/Given 04/19/2019 1146)  ondansetron (ZOFRAN) injection 4 mg (4 mg Intravenous Given 04/02/2019 1779)    ED Course  I have reviewed  the triage vital signs and the nursing notes.  Pertinent labs & imaging results that were available during my care of the patient were reviewed by me and considered in my medical decision making (see chart for details).    MDM Rules/Calculators/A&P                     On repeat exam the patient is in similar condition, now aware of abnormal CT finding concerning for acute diverticulitis. 12:03 PM Patient mains hemodynamically unremarkable, but but with p.o. intolerance persistent diarrhea, and CT concerning for acute diverticulitis, she will require admission for ongoing antibiotics, fluids. Covid test pending. Final Clinical Impression(s) / ED Diagnoses Final diagnoses:  Acute diverticulitis     Carmin Muskrat, MD 04/07/2019 1204

## 2019-04-09 NOTE — Progress Notes (Signed)
PHARMACY NOTE:  ANTIMICROBIAL RENAL DOSAGE ADJUSTMENT  Indication: IAI    Renal Function:  Estimated Creatinine Clearance: 17.2 mL/min (A) (by C-G formula based on SCr of 2.51 mg/dL (H)). []      On intermittent HD, scheduled: []      On CRRT    Antimicrobial dosage has been changed to:  Cipro 400 mg IV q24  Eudelia Bunch, Pharm.D [id number] 04/26/2019 3:48 PM

## 2019-04-09 NOTE — ED Notes (Signed)
Report called to receiving nurse KIM for pt transfer to the floor

## 2019-04-09 NOTE — ED Notes (Signed)
Labs obtained by main lab phlebotomy.

## 2019-04-09 NOTE — ED Triage Notes (Signed)
Pt presents with EMS from home with c/o vomiting and diarrhea. Pt had an infusion yesterday for breast cancer and afterwards was having some diarrhea. Today, pt has reported some vomiting, green in nature. Pt denies any pain at this time. Concern by family r/t weakness.

## 2019-04-09 NOTE — Progress Notes (Signed)
Report received from ED 

## 2019-04-10 ENCOUNTER — Encounter: Payer: Self-pay | Admitting: Oncology

## 2019-04-10 DIAGNOSIS — J90 Pleural effusion, not elsewhere classified: Secondary | ICD-10-CM | POA: Diagnosis not present

## 2019-04-10 DIAGNOSIS — K7460 Unspecified cirrhosis of liver: Secondary | ICD-10-CM | POA: Diagnosis present

## 2019-04-10 DIAGNOSIS — E1122 Type 2 diabetes mellitus with diabetic chronic kidney disease: Secondary | ICD-10-CM | POA: Diagnosis present

## 2019-04-10 DIAGNOSIS — K567 Ileus, unspecified: Secondary | ICD-10-CM | POA: Diagnosis not present

## 2019-04-10 DIAGNOSIS — R531 Weakness: Secondary | ICD-10-CM | POA: Diagnosis not present

## 2019-04-10 DIAGNOSIS — R188 Other ascites: Secondary | ICD-10-CM | POA: Diagnosis present

## 2019-04-10 DIAGNOSIS — Z6841 Body Mass Index (BMI) 40.0 and over, adult: Secondary | ICD-10-CM | POA: Diagnosis not present

## 2019-04-10 DIAGNOSIS — K766 Portal hypertension: Secondary | ICD-10-CM | POA: Diagnosis present

## 2019-04-10 DIAGNOSIS — I130 Hypertensive heart and chronic kidney disease with heart failure and stage 1 through stage 4 chronic kidney disease, or unspecified chronic kidney disease: Secondary | ICD-10-CM | POA: Diagnosis present

## 2019-04-10 DIAGNOSIS — K521 Toxic gastroenteritis and colitis: Secondary | ICD-10-CM | POA: Diagnosis present

## 2019-04-10 DIAGNOSIS — R161 Splenomegaly, not elsewhere classified: Secondary | ICD-10-CM | POA: Diagnosis present

## 2019-04-10 DIAGNOSIS — K5732 Diverticulitis of large intestine without perforation or abscess without bleeding: Secondary | ICD-10-CM | POA: Diagnosis present

## 2019-04-10 DIAGNOSIS — D849 Immunodeficiency, unspecified: Secondary | ICD-10-CM | POA: Diagnosis present

## 2019-04-10 DIAGNOSIS — Z66 Do not resuscitate: Secondary | ICD-10-CM | POA: Diagnosis present

## 2019-04-10 DIAGNOSIS — I959 Hypotension, unspecified: Secondary | ICD-10-CM | POA: Diagnosis not present

## 2019-04-10 DIAGNOSIS — R600 Localized edema: Secondary | ICD-10-CM | POA: Diagnosis not present

## 2019-04-10 DIAGNOSIS — C50912 Malignant neoplasm of unspecified site of left female breast: Secondary | ICD-10-CM | POA: Diagnosis not present

## 2019-04-10 DIAGNOSIS — N179 Acute kidney failure, unspecified: Secondary | ICD-10-CM | POA: Diagnosis not present

## 2019-04-10 DIAGNOSIS — Z20822 Contact with and (suspected) exposure to covid-19: Secondary | ICD-10-CM | POA: Diagnosis present

## 2019-04-10 DIAGNOSIS — Z7189 Other specified counseling: Secondary | ICD-10-CM | POA: Diagnosis not present

## 2019-04-10 DIAGNOSIS — E871 Hypo-osmolality and hyponatremia: Secondary | ICD-10-CM | POA: Diagnosis not present

## 2019-04-10 DIAGNOSIS — N184 Chronic kidney disease, stage 4 (severe): Secondary | ICD-10-CM | POA: Diagnosis present

## 2019-04-10 DIAGNOSIS — E872 Acidosis: Secondary | ICD-10-CM | POA: Diagnosis present

## 2019-04-10 DIAGNOSIS — J9601 Acute respiratory failure with hypoxia: Secondary | ICD-10-CM | POA: Diagnosis not present

## 2019-04-10 DIAGNOSIS — E876 Hypokalemia: Secondary | ICD-10-CM | POA: Diagnosis not present

## 2019-04-10 DIAGNOSIS — E875 Hyperkalemia: Secondary | ICD-10-CM | POA: Diagnosis not present

## 2019-04-10 DIAGNOSIS — Z515 Encounter for palliative care: Secondary | ICD-10-CM | POA: Diagnosis not present

## 2019-04-10 LAB — GLUCOSE, CAPILLARY
Glucose-Capillary: 256 mg/dL — ABNORMAL HIGH (ref 70–99)
Glucose-Capillary: 259 mg/dL — ABNORMAL HIGH (ref 70–99)
Glucose-Capillary: 278 mg/dL — ABNORMAL HIGH (ref 70–99)
Glucose-Capillary: 248 mg/dL — ABNORMAL HIGH (ref 70–99)

## 2019-04-10 LAB — CBC
HCT: 39.4 % (ref 36.0–46.0)
Hemoglobin: 13.3 g/dL (ref 12.0–15.0)
MCH: 37.6 pg — ABNORMAL HIGH (ref 26.0–34.0)
MCHC: 33.8 g/dL (ref 30.0–36.0)
MCV: 111.3 fL — ABNORMAL HIGH (ref 80.0–100.0)
Platelets: 106 10*3/uL — ABNORMAL LOW (ref 150–400)
RBC: 3.54 MIL/uL — ABNORMAL LOW (ref 3.87–5.11)
RDW: 15.9 % — ABNORMAL HIGH (ref 11.5–15.5)
WBC: 10.4 10*3/uL (ref 4.0–10.5)
nRBC: 0.4 % — ABNORMAL HIGH (ref 0.0–0.2)

## 2019-04-10 LAB — COMPREHENSIVE METABOLIC PANEL
ALT: 11 U/L (ref 0–44)
AST: 19 U/L (ref 15–41)
Albumin: 2.4 g/dL — ABNORMAL LOW (ref 3.5–5.0)
Alkaline Phosphatase: 54 U/L (ref 38–126)
Anion gap: 13 (ref 5–15)
BUN: 55 mg/dL — ABNORMAL HIGH (ref 8–23)
CO2: 17 mmol/L — ABNORMAL LOW (ref 22–32)
Calcium: 7.9 mg/dL — ABNORMAL LOW (ref 8.9–10.3)
Chloride: 105 mmol/L (ref 98–111)
Creatinine, Ser: 2.15 mg/dL — ABNORMAL HIGH (ref 0.44–1.00)
GFR calc Af Amer: 24 mL/min — ABNORMAL LOW (ref >60)
GFR calc non Af Amer: 21 mL/min — ABNORMAL LOW (ref >60)
Glucose, Bld: 260 mg/dL — ABNORMAL HIGH (ref 70–99)
Potassium: 3.4 mmol/L — ABNORMAL LOW (ref 3.5–5.1)
Sodium: 135 mmol/L (ref 135–145)
Total Bilirubin: 1.5 mg/dL — ABNORMAL HIGH (ref 0.3–1.2)
Total Protein: 4.8 g/dL — ABNORMAL LOW (ref 6.5–8.1)

## 2019-04-10 LAB — C DIFFICILE QUICK SCREEN W PCR REFLEX
C Diff antigen: NEGATIVE
C Diff interpretation: NOT DETECTED
C Diff toxin: NEGATIVE

## 2019-04-10 LAB — PROTIME-INR
INR: 1.7 — ABNORMAL HIGH (ref 0.8–1.2)
Prothrombin Time: 20.1 seconds — ABNORMAL HIGH (ref 11.4–15.2)

## 2019-04-10 MED ORDER — INSULIN GLARGINE 100 UNIT/ML ~~LOC~~ SOLN
20.0000 [IU] | Freq: Every day | SUBCUTANEOUS | Status: DC
  Administered 2019-04-10: 20 [IU] via SUBCUTANEOUS
  Filled 2019-04-10 (×2): qty 0.2

## 2019-04-10 MED ORDER — POTASSIUM CHLORIDE IN NACL 20-0.9 MEQ/L-% IV SOLN
INTRAVENOUS | Status: DC
  Filled 2019-04-10 (×4): qty 1000

## 2019-04-10 MED ORDER — HEPARIN SODIUM (PORCINE) 5000 UNIT/ML IJ SOLN
5000.0000 [IU] | Freq: Three times a day (TID) | INTRAMUSCULAR | Status: DC
  Administered 2019-04-10 – 2019-04-16 (×17): 5000 [IU] via SUBCUTANEOUS
  Filled 2019-04-10 (×17): qty 1

## 2019-04-10 MED ORDER — LOPERAMIDE HCL 2 MG PO CAPS
2.0000 mg | ORAL_CAPSULE | ORAL | Status: AC | PRN
  Administered 2019-04-10 (×2): 2 mg via ORAL
  Filled 2019-04-10 (×2): qty 1

## 2019-04-10 MED ORDER — MAGIC MOUTHWASH W/LIDOCAINE
5.0000 mL | Freq: Four times a day (QID) | ORAL | Status: DC
  Administered 2019-04-10 – 2019-04-17 (×26): 5 mL via ORAL
  Filled 2019-04-10 (×35): qty 5

## 2019-04-10 NOTE — Progress Notes (Addendum)
HEMATOLOGY-ONCOLOGY PROGRESS NOTE  SUBJECTIVE: [name] presented to the emergency room with nausea, vomiting, and diarrhea.  Labs showed mild leukocytosis and mild thrombocytopenia.  BUN was elevated at 62 and creatinine was 2.51.  CT of the abdomen and pelvis without contrast showed sigmoid diverticulitis, hepatic cirrhosis with associated portal hypertension and mild splenomegaly.  She is currently receiving IV fluids and IV antibiotics.  Still having abdominal discomfort this morning.  Reports nausea but not vomiting this morning.  She reports that she vomited once yesterday.  She has been having a lot of frequent, loose stools.  States that they are not bloody.  Reports discomfort to her hands but denies pain to her feet.  Reports mouth sores.   REVIEW OF SYSTEMS:   Noncontributory except as noted in the HPI.  I have reviewed the past medical history, past surgical history, social history and family history with the patient and they are unchanged from previous note.   PHYSICAL EXAMINATION: ECOG PERFORMANCE STATUS: 2 - Symptomatic, <50% confined to bed  Vitals:   04/25/2019 2218 04/10/19 0540  BP: 119/74 135/80  Pulse: 79 98  Resp: 20   Temp: 98.2 F (36.8 C) 99.2 F (37.3 C)  SpO2: 98% 91%   Filed Weights   04/12/2019 1809  Weight: 203 lb 7.8 oz (92.3 kg)    Intake/Output from previous day: 02/10 0701 - 02/11 0700 In: 1181.3 [I.V.:1081.3; IV Piggyback:100] Out: -   GENERAL: Tired appearing female, no distress OROPHARYNX: Oral mucositis noted LUNGS: clear to auscultation and percussion with normal breathing effort HEART: regular rate & rhythm and no murmurs and no lower extremity edema ABDOMEN: Positive bowel sounds, soft, pain in the left upper quadrant and epigastric area with palpation Musculoskeletal:no cyanosis of digits and no clubbing  NEURO: alert & oriented x 3 with fluent speech, no focal motor/sensory deficits  LABORATORY DATA:  I have reviewed the data as  listed CMP Latest Ref Rng & Units 04/10/2019 04/13/2019 04/08/2019  Glucose 70 - 99 mg/dL 260(H) 180(H) 339(H)  BUN 8 - 23 mg/dL 55(H) 62(H) 56(H)  Creatinine 0.44 - 1.00 mg/dL 2.15(H) 2.51(H) 2.49(H)  Sodium 135 - 145 mmol/L 135 135 133(L)  Potassium 3.5 - 5.1 mmol/L 3.4(L) 3.5 3.3(L)  Chloride 98 - 111 mmol/L 105 105 102  CO2 22 - 32 mmol/L 17(L) 20(L) 21(L)  Calcium 8.9 - 10.3 mg/dL 7.9(L) 7.7(L) 7.4(L)  Total Protein 6.5 - 8.1 g/dL 4.8(L) 5.2(L) 4.4(L)  Total Bilirubin 0.3 - 1.2 mg/dL 1.5(H) 1.3(H) 0.9  Alkaline Phos 38 - 126 U/L 54 63 57  AST 15 - 41 U/L 19 23 14(L)  ALT 0 - 44 U/L _0 Lab Results  Component Value Date   WBC 10.4 04/10/2019   HGB 13.3 04/10/2019   HCT 39.4 04/10/2019   MCV 111.3 (H) 04/10/2019   PLT 106 (L) 04/10/2019   NEUTROABS 8.1 (H) 04/21/2019    CT ABDOMEN PELVIS WO CONTRAST  Result Date: 04/13/2019 CLINICAL DATA:  Generalized abdominal pain. EXAM: CT ABDOMEN AND PELVIS WITHOUT CONTRAST TECHNIQUE: Multidetector CT imaging of the abdomen and pelvis was performed following the standard protocol without IV contrast due to renal insufficiency. COMPARISON:  August 29th 2020. FINDINGS: Lower chest: No acute abnormality. Hepatobiliary: Status post cholecystectomy. No biliary dilatation is noted. Nodular hepatic contours are noted consistent with hepatic cirrhosis. No focal hepatic abnormality is noted on these unenhanced images. Pancreas: Unremarkable. No pancreatic ductal dilatation or surrounding inflammatory changes. Spleen: Stable 17 mm  low density is noted in anterior portion of the spleen. Stable mild splenomegaly is noted. Adrenals/Urinary Tract: Adrenal glands appear normal. Left renal atrophy is noted. No hydronephrosis or renal obstruction is noted. No renal or ureteral calculi are noted. Urinary bladder is unremarkable. Stomach/Bowel: The stomach appears normal. There is no evidence of bowel obstruction. The appendix is not clearly visualized, but no  inflammation is noted in the right lower quadrant. However, diverticulosis is noted involving the sigmoid colon with surrounding inflammation consistent with diverticulitis. Vascular/Lymphatic: Atherosclerosis of abdominal aorta is noted without aneurysm formation. No significant adenopathy is noted. Multiple collateral veins are noted in the retroperitoneal region consistent with portal hypertension. Reproductive: Status post hysterectomy. No adnexal masses. Other: No abdominal wall hernia or abnormality. No abdominopelvic ascites. Musculoskeletal: Extensive postsurgical and degenerative changes are noted in the lumbar spine. No acute abnormality is noted. IMPRESSION: 1. Findings consistent with sigmoid diverticulitis. No abscess is noted. 2. Findings consistent with hepatic cirrhosis with associated portal hypertension and mild splenomegaly. 3. Left renal atrophy is noted. No hydronephrosis or renal obstruction is noted. 4. Aortic atherosclerosis. Aortic Atherosclerosis (ICD10-I70.0). Electronically Signed   By: Marijo Conception M.D.   On: 04/08/2019 10:42   CT CHEST WO CONTRAST  Result Date: 04/08/2019 CLINICAL DATA:  Breast cancer surveillance and staging. EXAM: CT CHEST WITHOUT CONTRAST TECHNIQUE: Multidetector CT imaging of the chest was performed following the standard protocol without IV contrast. COMPARISON:  Chest radiography same day.  CT 10/26/2018 FINDINGS: Cardiovascular: Atherosclerosis of the aorta and its branch vessels as seen previously. Coronary artery calcification. Heart size is normal. No pericardial fluid. Mediastinum/Nodes: No mediastinal or hilar mass or lymphadenopathy. Lungs/Pleura: No sign of pulmonary metastatic disease. Few areas of pulmonary scarring. No pleural fluid. Upper Abdomen: Chronic cirrhosis of the liver. No acute abdominal finding. Very small amount of fluid adjacent to the liver edge. Musculoskeletal: Left breast skin thickening with internal breast density, nonspecific  by CT and fairly similar to the previous study. No sign of osseous metastatic disease. Chronic degenerative change of the spine in the lower thoracic and upper lumbar region, subsequent to previous lumbar fusion. No significant change since the previous study. IMPRESSION: No sign of metastatic disease in the chest region. Skin thickening of the left breast with internal left breast density, nonspecific by CT. Aortic atherosclerosis.  Coronary artery calcification. Chronic thoracolumbar degenerative changes without evidence bony metastatic disease or significant change since last year. Electronically Signed   By: Nelson Chimes M.D.   On: 04/25/2019 19:41   DG Chest Portable 1 View  Result Date: 04/24/2019 CLINICAL DATA:  86 year old with current history of recurrent estrogen receptor positive breast cancer for which the patient is undergoing chemotherapy, presenting with acute onset of myalgias, nausea and diarrhea that began yesterday. EXAM: PORTABLE CHEST 1 VIEW COMPARISON:  CT chest 10/26/2018. Chest x-rays 10/26/2018 and earlier. FINDINGS: Cardiac silhouette upper normal in size for AP portable technique, unchanged. Thoracic aorta atherosclerotic, unchanged. Prominent central RIGHT pulmonary vessels, unchanged. Bronchiectasis involving the RIGHT LOWER LOBE, unchanged. Lungs clear. No confluent or ground-glass airspace consolidation. Normal pulmonary vascularity. No visible pleural effusions. IMPRESSION: No acute cardiopulmonary disease. Stable bronchiectasis involving the RIGHT LOWER LOBE. Electronically Signed   By: Evangeline Dakin M.D.   On: 04/12/2019 09:44    ASSESSMENT: 86 y.o. [place] woman status post left lumpectomy and sentinel lymph node sampling 03/12/2012 for a  pT1b pN0, stage IA invasive ductal carcinoma, grade 3, strongly estrogen and progesterone receptor positive (100 and  98%), HER-2 nonamplified, with an MIB-1 of 15%             (a) additional surgery for margin clearance was  successful 03/22/2012   (1) the patient opted against adjuvant radiation   (2) anastrozole started March 2014, discontinued 04/03/2018 with evidence of recurrence   RECURRENT DISEASE: February 2020 (3) status post left breast retroareolar biopsy 03/05/2018 for a clinical T1c N1, stage IIA invasive ductal carcinoma, estrogen and progesterone receptor positive, HER-2 not amplified, with an MIB-1 of 20%.   (4) spread to skin/chest wall on left noted 04/03/2018             (a) chest CT 04/11/2018 scan showed nonspecific bilateral axillary adenopathy              (b) bone scan 04/11/2018 shows only degenerative disease             (c) CA 27-29 not informative (was 25.6 on 04/26/2018)             (d) biopsy of 2 left breast skin nodules 04/04/2018 shows invasive carcinoma, estrogen receptor 100% positive, progesterone receptor and HER-2 negative, with an MIB-1 of 30%.   (5) started fulvestrant 04/12/2018             (a) added palbociclib 04/26/2018 at 125 mg p.o. daily, 21 days on, 7 days off                         (1) dose decreased to 75 mg daily, 21/03 September 2018                         (2) dose decreased to 75 mg every other day, 21/7 with October 2020 cycle             (b) tamoxifen added and fulvestrant changed to every 8 weeks after 08/13/2018 dose to minimize exposure during the pandemic, discontinued when monthly fulvestrant resumed             (c) fulvestrant resumed at every 4 weeks beginning with 11/25/2018 dose             (d) fulvestrant and palbociclib discontinued 02/17/2019 with evidence of chest wall progression   (6) CT of the head, chest, abdomen, and pelvis August/September 2020 shows no evidence of metastatic disease outside of the left breast             (a) plain films of the cervical thoracic lumbar spine and ribs shows no obvious bone lesions   (7) capecitabine started 03/02/2018, initially at 1000 mg p.o. twice daily.             (a) dose increased to 1500 mg in the  morning and 1000 mg in the evening starting 03/24/2019     PLAN: [name]  is now admitted with abdominal pain, nausea, and diarrhea.  Capecitabine is currently on hold.  CT showed diverticulitis without abscess.  She is currently receiving IV fluids and IV antibiotics.  Still having a fair amount of abdominal discomfort, nausea, and diarrhea this morning.  Recommend for the patient to take Questran twice a day.  She also has Imodium ordered.  We will check a stool for C. difficile as well.  For her mouth ulcers, she will continue Valtrex twice daily.  I have also added Magic mouthwash 4 times a day to help with her oral pain.   She had a CT of  the chest without contrast which did not show any evidence of metastatic disease in the chest.  CT of the abdomen pelvis did not show any evidence of cancer.  We will continue to follow with you.   LOS: 0 days   Mikey Bussing, DNP, AGPCNP-BC, AOCNP 04/10/19   ADDENDUM: Unfortunately [name] was not able to tolerate the slight increase made in her capecitabine dose-- this has ked to severe diarrhea and mouth sores. This should resolve  over the next 24-72 hours with IVF and supportive care.  It is not inconceivable that she could have c difficile diarrhea and we will enter that order but capecitabine is a sufficient explanation of her current symptoms.   She will return to see me in the clinic and we will return to anti-estrogens (likely everolimus/exemestane) as her next treatment protocol.  I greatly appreciate your help to thia patient and her family  I personally saw this patient and performed a substantive portion of this encounter with the listed APP documented above.   Chauncey Cruel, MD Medical Oncology and Hematology [facility] [address] [place], [place] [address] Tel. [phone]    Fax. [phone]

## 2019-04-10 NOTE — Progress Notes (Signed)
PROGRESS NOTE    [name]  KYH:[id number] DOB: 1933/05/19 DOA: 03/31/2019 PCP: Celene Squibb, MD   Brief Narrative: 86 YOF with significant history of anemia/osteoarthritis, left breast cancer, cataracts, CKD IV, liver cirrhosis, T2DM on long-term insulin, GERD, hyperlipidemia, hypertension history of multiple falls in the past, nocturia, seasonal allergies, history of facial melanoma with surgical excision in 2012 comes to the ER with multiple episodes of nausea vomiting and diarrhea following her chemotherapy cycle this week without melena hematochezia dysuria but with low-grade fever since Saturday, decreased oral intake for several days. In the ED vitals are stable UA CBC and CMP shows metabolic acidosis/CKD chronic.  LFTs lipase normal COVID-19) [place] negative, underwent chest x-ray no acute finding, and CT abdomen without contrast showed sigmoid diverticulitis, liver cirrhosis.  Patient was placed on IV antibiotic and was admitted on liquid diet.  Subjective: Seen this morning still complains of nausea, left lower quadrant is tender to palpation. Has not eaten much. C/o skin rash for which she recently saw dermatologist.  Assessment & Plan:  Nausea vomiting diarrhea, secondary to Sigmoid diverticulitis, ?  Secondary to chemotherapy as well.  Continue on supportive measures with liquid diet, IV fluid hydration nausea medication.  Keep the patient on IV Cipro Flagyl.  Monitor labs.  Patient at risk for further dehydration given ongoing nausea. Continue IV Zofran.  Advance diet from clear liquid to full liquid diet as tolerated. Given her diarrhea C. difficile ordered on admission. Keep on  Enteric precaution for now.  Dehydration/hypovolemia: Due to #1.  BUN is significantly elevated at 55 baseline around 20s. Cont on ivf.  HTN: Well-controlled on amlodipine, metoprolol.  Hypokalemia will replete potassium and IV fluids.  DM type 2 on insulin, uncontrolled A1c 11.6 04/13/2019,  uncontrolled hyperglycemia: Hold glipizide, add Lantus 20 units daily while not eating well once tolerating diet will resume her full dose of Lantus she takes [place] 50 units daily.  Continue sliding scale.  Hyperlipidemia: Resume statin  Chronic kidney disease stage IV/metabolic acidosis, chronic baseline creatinine 2.1-2.5.  Monitor.  GERD: Continue on PPI  Recurrent breast adenocarcinoma, left: On Xeloda chemo, oncology has been consulted.  Cirrhosis with portal hypertension: Appears compensated.  Bilirubin slightly abnormal.  Monitor.  Morbid obesity with Body mass index is 39.74 kg/m.   DVT prophylaxis: SCD.  Pt is high risk add Hepar sq Family Communication: plan of care discussed with patient at bedside.  Discussed with the daughter over the phone Disposition Plan: Patient is from: Home Anticipated Disposition: Home Barriers to discharge or conditions that needs to be met prior to discharge: home once improvement in nausea, able to tolerate diet.  Anticipate hospital stay at least 2 midnights, will change the patient to inpatient status due to ongoing need for IV fluids, IV antibiotics as he has ongoing nausea and being treated with IV antibiotics for sigmoid diverticulitis and has significant dehydration.  Consultants: Oncology Procedures:see note Microbiology:see note  Medications: Scheduled Meds: . amLODipine  5 mg Oral q morning - 10a  . aspirin EC  81 mg Oral Daily  . atorvastatin  10 mg Oral QPM  . brimonidine  1 drop Both Eyes BID  . budesonide  2 mL Inhalation BID  . cholestyramine light  4 g Oral 2 times per day  . dorzolamide  1 drop Both Eyes BID  . fluconazole  200 mg Oral Daily  . gabapentin  600 mg Oral Daily  . insulin aspart  0-15 Units Subcutaneous TID WC  .  insulin glargine  20 Units Subcutaneous Daily  . ipratropium-albuterol  3 mL Nebulization TID  . latanoprost  1 drop Both Eyes QHS  . magic mouthwash w/lidocaine  5 mL Oral QID  . metoprolol  succinate  100 mg Oral q morning - 10a  . mirabegron ER  50 mg Oral Daily  . mirtazapine  15 mg Oral QHS  . valACYclovir  500 mg Oral BID   Continuous Infusions: . ciprofloxacin    . metronidazole 500 mg (04/10/19 0555)    Antimicrobials: Anti-infectives (From admission, onward)   Start     Dose/Rate Route Frequency Ordered Stop   04/10/19 1200  ciprofloxacin (CIPRO) IVPB 400 mg     400 mg 200 mL/hr over 60 Minutes Intravenous Every 24 hours 04/03/2019 1530     04/10/19 0000  ciprofloxacin (CIPRO) IVPB 400 mg  Status:  Discontinued     400 mg 200 mL/hr over 60 Minutes Intravenous Every 12 hours 04/04/2019 1523 04/07/2019 1530   04/23/2019 2200  metroNIDAZOLE (FLAGYL) IVPB 500 mg     500 mg 100 mL/hr over 60 Minutes Intravenous Every 8 hours 04/08/2019 1523     04/13/2019 2200  valACYclovir (VALTREX) tablet 500 mg     500 mg Oral 2 times daily 04/13/2019 1740     04/11/2019 1830  fluconazole (DIFLUCAN) tablet 200 mg     200 mg Oral Daily 04/08/2019 1740     04/15/2019 1115  ciprofloxacin (CIPRO) IVPB 400 mg     400 mg 200 mL/hr over 60 Minutes Intravenous  Once 04/26/2019 1101 04/17/2019 1339   04/06/2019 1115  metroNIDAZOLE (FLAGYL) IVPB 500 mg     500 mg 100 mL/hr over 60 Minutes Intravenous  Once 04/20/2019 1101 04/16/2019 1339       Objective: Vitals:   04/08/2019 1809 04/20/2019 2048 04/14/2019 2218 04/10/19 0540  BP:   119/74 135/80  Pulse:   79 98  Resp:   20   Temp:   98.2 F (36.8 C) 99.2 F (37.3 C)  TempSrc:   Oral Oral  SpO2:  96% 98% 91%  Weight: 92.3 kg     Height: 5' (1.524 m)       Intake/Output Summary (Last 24 hours) at 04/10/2019 1015 Last data filed at 04/10/2019 0407 Gross per 24 hour  Intake 1181.25 ml  Output --  Net 1181.25 ml   Filed Weights   04/08/2019 1809  Weight: 92.3 kg   Weight change:   Body mass index is 39.74 kg/m.  Intake/Output from previous day: 02/10 0701 - 02/11 0700 In: 1181.3 [I.V.:1081.3; IV Piggyback:100] Out: -  Intake/Output this shift: No  intake/output data recorded.  Examination:  General exam: AAOx3, nauseas,NAD, weak appearing. HEENT:Oral mucosa moist, Ear/Nose WNL grossly, dentition normal. Respiratory system: bilaterally clear,no wheezing or crackles,no use of accessory muscle Cardiovascular system: S1 & S2 +, No JVD,. Gastrointestinal system: Abdomen soft, Tender left lower quadrant,ND, BS+ Nervous System:Alert, awake, moving extremities and grossly nonfocal Extremities: No edema, distal peripheral pulses palpable.  Skin: No rashes,no icterus. MSK: Normal muscle bulk,tone, power  Data Reviewed: I have personally reviewed following labs and imaging studies  CBC: Recent Labs  Lab 04/08/19 1420 04/13/2019 1035 04/10/19 0454  WBC 8.3 10.3 10.4  NEUTROABS 6.3 8.1*  --   HGB 11.1* 13.2 13.3  HCT 32.4* 38.0 39.4  MCV 109.1* 109.5* 111.3*  PLT 93* 110* 295*   Basic Metabolic Panel: Recent Labs  Lab 04/08/19 1420 04/08/2019 1035 04/10/19 0454  NA  133* 135 135  K 3.3* 3.5 3.4*  CL 102 105 105  CO2 21* 20* 17*  GLUCOSE 339* 180* 260*  BUN 56* 62* 55*  CREATININE 2.49* 2.51* 2.15*  CALCIUM 7.4* 7.7* 7.9*   GFR: Estimated Creatinine Clearance: 20.1 mL/min (A) (by C-G formula based on SCr of 2.15 mg/dL (H)). Liver Function Tests: Recent Labs  Lab 04/08/19 1420 04/07/2019 1035 04/10/19 0454  AST 14* 23 19  ALT '8 12 11  '$ ALKPHOS 57 63 54  BILITOT 0.9 1.3* 1.5*  PROT 4.4* 5.2* 4.8*  ALBUMIN 2.3* 2.6* 2.4*   Recent Labs  Lab 04/06/2019 1035  LIPASE 27   No results for input(s): AMMONIA in the last 168 hours. Coagulation Profile: Recent Labs  Lab 04/10/19 0454  INR 1.7*   Cardiac Enzymes: No results for input(s): CKTOTAL, CKMB, CKMBINDEX, TROPONINI in the last 168 hours. BNP (last 3 results) No results for input(s): PROBNP in the last 8760 hours. HbA1C: Recent Labs    04/05/2019 1825  HGBA1C 11.6*   CBG: Recent Labs  Lab 04/17/2019 1755 04/13/2019 2219 04/10/19 0737  GLUCAP 149* 265* 256*    Lipid Profile: No results for input(s): CHOL, HDL, LDLCALC, TRIG, CHOLHDL, LDLDIRECT in the last 72 hours. Thyroid Function Tests: No results for input(s): TSH, T4TOTAL, FREET4, T3FREE, THYROIDAB in the last 72 hours. Anemia Panel: No results for input(s): VITAMINB12, FOLATE, FERRITIN, TIBC, IRON, RETICCTPCT in the last 72 hours. Sepsis Labs: No results for input(s): PROCALCITON, LATICACIDVEN in the last 168 hours.  Recent Results (from the past 240 hour(s))  Respiratory Panel by RT PCR (Flu A&B, Covid) - Nasopharyngeal Swab     Status: None   Collection Time: 04/08/2019 11:02 AM   Specimen: Nasopharyngeal Swab  Result Value Ref Range Status   SARS Coronavirus 2 by RT PCR NEGATIVE NEGATIVE Final    Comment: (NOTE) SARS-CoV-2 target nucleic acids are NOT DETECTED. The SARS-CoV-2 RNA is generally detectable in upper respiratoy specimens during the acute phase of infection. The lowest concentration of SARS-CoV-2 viral copies this assay can detect is 131 copies/mL. A negative result does not preclude SARS-Cov-2 infection and should not be used as the sole basis for treatment or other patient management decisions. A negative result may occur with  improper specimen collection/handling, submission of specimen other than nasopharyngeal swab, presence of viral mutation(s) within the areas targeted by this assay, and inadequate number of viral copies (<131 copies/mL). A negative result must be combined with clinical observations, patient history, and epidemiological information. The expected result is Negative. Fact Sheet for Patients:  PinkCheek.be Fact Sheet for Healthcare Providers:  GravelBags.it This test is not yet ap proved or cleared by the [place] FDA and  has been authorized for detection and/or diagnosis of SARS-CoV-2 by FDA under an Emergency Use Authorization (EUA). This EUA will remain  in effect (meaning this  test can be used) for the duration of the COVID-19 declaration under Section 564(b)(1) of the Act, 21 U.S.C. section 360bbb-3(b)(1), unless the authorization is terminated or revoked sooner.    Influenza A by PCR NEGATIVE NEGATIVE Final   Influenza B by PCR NEGATIVE NEGATIVE Final    Comment: (NOTE) The Xpert Xpress SARS-CoV-2/FLU/RSV assay is intended as an aid in  the diagnosis of influenza from Nasopharyngeal swab specimens and  should not be used as a sole basis for treatment. Nasal washings and  aspirates are unacceptable for Xpert Xpress SARS-CoV-2/FLU/RSV  testing. Fact Sheet for Patients: PinkCheek.be Fact Sheet for Healthcare  Providers: GravelBags.it This test is not yet approved or cleared by the [place] and  has been authorized for detection and/or diagnosis of SARS-CoV-2 by  FDA under an Emergency Use Authorization (EUA). This EUA will remain  in effect (meaning this test can be used) for the duration of the  Covid-19 declaration under Section 564(b)(1) of the Act, 21  U.S.C. section 360bbb-3(b)(1), unless the authorization is  terminated or revoked. Performed at [facility], [place] [address] [place], [place] [address]   MRSA PCR Screening     Status: None   Collection Time: 04/07/2019  6:38 PM   Specimen: Nasopharyngeal  Result Value Ref Range Status   MRSA by PCR NEGATIVE NEGATIVE Final    Comment:        The GeneXpert MRSA Assay (FDA approved for NASAL specimens only), is one component of a comprehensive MRSA colonization surveillance program. It is not intended to diagnose MRSA infection nor to guide or monitor treatment for MRSA infections. Performed at [facility], [place] [address], [place], [place] [address]       Radiology Studies: CT ABDOMEN PELVIS WO CONTRAST  Result Date: 04/26/2019 CLINICAL DATA:  Generalized abdominal pain. EXAM: CT ABDOMEN  AND PELVIS WITHOUT CONTRAST TECHNIQUE: Multidetector CT imaging of the abdomen and pelvis was performed following the standard protocol without IV contrast due to renal insufficiency. COMPARISON:  August 29th 2020. FINDINGS: Lower chest: No acute abnormality. Hepatobiliary: Status post cholecystectomy. No biliary dilatation is noted. Nodular hepatic contours are noted consistent with hepatic cirrhosis. No focal hepatic abnormality is noted on these unenhanced images. Pancreas: Unremarkable. No pancreatic ductal dilatation or surrounding inflammatory changes. Spleen: Stable 17 mm low density is noted in anterior portion of the spleen. Stable mild splenomegaly is noted. Adrenals/Urinary Tract: Adrenal glands appear normal. Left renal atrophy is noted. No hydronephrosis or renal obstruction is noted. No renal or ureteral calculi are noted. Urinary bladder is unremarkable. Stomach/Bowel: The stomach appears normal. There is no evidence of bowel obstruction. The appendix is not clearly visualized, but no inflammation is noted in the right lower quadrant. However, diverticulosis is noted involving the sigmoid colon with surrounding inflammation consistent with diverticulitis. Vascular/Lymphatic: Atherosclerosis of abdominal aorta is noted without aneurysm formation. No significant adenopathy is noted. Multiple collateral veins are noted in the retroperitoneal region consistent with portal hypertension. Reproductive: Status post hysterectomy. No adnexal masses. Other: No abdominal wall hernia or abnormality. No abdominopelvic ascites. Musculoskeletal: Extensive postsurgical and degenerative changes are noted in the lumbar spine. No acute abnormality is noted. IMPRESSION: 1. Findings consistent with sigmoid diverticulitis. No abscess is noted. 2. Findings consistent with hepatic cirrhosis with associated portal hypertension and mild splenomegaly. 3. Left renal atrophy is noted. No hydronephrosis or renal obstruction is  noted. 4. Aortic atherosclerosis. Aortic Atherosclerosis (ICD10-I70.0). Electronically Signed   By: Marijo Conception M.D.   On: 04/21/2019 10:42   CT CHEST WO CONTRAST  Result Date: 04/13/2019 CLINICAL DATA:  Breast cancer surveillance and staging. EXAM: CT CHEST WITHOUT CONTRAST TECHNIQUE: Multidetector CT imaging of the chest was performed following the standard protocol without IV contrast. COMPARISON:  Chest radiography same day.  CT 10/26/2018 FINDINGS: Cardiovascular: Atherosclerosis of the aorta and its branch vessels as seen previously. Coronary artery calcification. Heart size is normal. No pericardial fluid. Mediastinum/Nodes: No mediastinal or hilar mass or lymphadenopathy. Lungs/Pleura: No sign of pulmonary metastatic disease. Few areas of pulmonary scarring. No pleural fluid. Upper Abdomen: Chronic cirrhosis of the liver.  No acute abdominal finding. Very small amount of fluid adjacent to the liver edge. Musculoskeletal: Left breast skin thickening with internal breast density, nonspecific by CT and fairly similar to the previous study. No sign of osseous metastatic disease. Chronic degenerative change of the spine in the lower thoracic and upper lumbar region, subsequent to previous lumbar fusion. No significant change since the previous study. IMPRESSION: No sign of metastatic disease in the chest region. Skin thickening of the left breast with internal left breast density, nonspecific by CT. Aortic atherosclerosis.  Coronary artery calcification. Chronic thoracolumbar degenerative changes without evidence bony metastatic disease or significant change since last year. Electronically Signed   By: Nelson Chimes M.D.   On: 04/17/2019 19:41   DG Chest Portable 1 View  Result Date: 04/17/2019 CLINICAL DATA:  86 year old with current history of recurrent estrogen receptor positive breast cancer for which the patient is undergoing chemotherapy, presenting with acute onset of myalgias, nausea and  diarrhea that began yesterday. EXAM: PORTABLE CHEST 1 VIEW COMPARISON:  CT chest 10/26/2018. Chest x-rays 10/26/2018 and earlier. FINDINGS: Cardiac silhouette upper normal in size for AP portable technique, unchanged. Thoracic aorta atherosclerotic, unchanged. Prominent central RIGHT pulmonary vessels, unchanged. Bronchiectasis involving the RIGHT LOWER LOBE, unchanged. Lungs clear. No confluent or ground-glass airspace consolidation. Normal pulmonary vascularity. No visible pleural effusions. IMPRESSION: No acute cardiopulmonary disease. Stable bronchiectasis involving the RIGHT LOWER LOBE. Electronically Signed   By: Evangeline Dakin M.D.   On: 04/06/2019 09:44     LOS: 0 days   Time spent: More than 50% of that time was spent in counseling and/or coordination of care.  Antonieta Pert, MD [facility]  04/10/2019, 10:15 AM

## 2019-04-11 DIAGNOSIS — K5732 Diverticulitis of large intestine without perforation or abscess without bleeding: Secondary | ICD-10-CM

## 2019-04-11 LAB — CBC
HCT: 37.2 % (ref 36.0–46.0)
Hemoglobin: 13 g/dL (ref 12.0–15.0)
MCH: 38.6 pg — ABNORMAL HIGH (ref 26.0–34.0)
MCHC: 34.9 g/dL (ref 30.0–36.0)
MCV: 110.4 fL — ABNORMAL HIGH (ref 80.0–100.0)
Platelets: 128 10*3/uL — ABNORMAL LOW (ref 150–400)
RBC: 3.37 MIL/uL — ABNORMAL LOW (ref 3.87–5.11)
RDW: 16.7 % — ABNORMAL HIGH (ref 11.5–15.5)
WBC: 11.1 10*3/uL — ABNORMAL HIGH (ref 4.0–10.5)
nRBC: 0.6 % — ABNORMAL HIGH (ref 0.0–0.2)

## 2019-04-11 LAB — BASIC METABOLIC PANEL
Anion gap: 9 (ref 5–15)
BUN: 55 mg/dL — ABNORMAL HIGH (ref 8–23)
CO2: 17 mmol/L — ABNORMAL LOW (ref 22–32)
Calcium: 7.8 mg/dL — ABNORMAL LOW (ref 8.9–10.3)
Chloride: 109 mmol/L (ref 98–111)
Creatinine, Ser: 2.28 mg/dL — ABNORMAL HIGH (ref 0.44–1.00)
GFR calc Af Amer: 22 mL/min — ABNORMAL LOW (ref >60)
GFR calc non Af Amer: 19 mL/min — ABNORMAL LOW (ref >60)
Glucose, Bld: 189 mg/dL — ABNORMAL HIGH (ref 70–99)
Potassium: 3.8 mmol/L (ref 3.5–5.1)
Sodium: 135 mmol/L (ref 135–145)

## 2019-04-11 LAB — GLUCOSE, CAPILLARY
Glucose-Capillary: 177 mg/dL — ABNORMAL HIGH (ref 70–99)
Glucose-Capillary: 258 mg/dL — ABNORMAL HIGH (ref 70–99)
Glucose-Capillary: 273 mg/dL — ABNORMAL HIGH (ref 70–99)
Glucose-Capillary: 284 mg/dL — ABNORMAL HIGH (ref 70–99)

## 2019-04-11 MED ORDER — INSULIN GLARGINE 100 UNIT/ML ~~LOC~~ SOLN
30.0000 [IU] | Freq: Every day | SUBCUTANEOUS | Status: DC
  Administered 2019-04-11 – 2019-04-12 (×2): 30 [IU] via SUBCUTANEOUS
  Filled 2019-04-11 (×2): qty 0.3

## 2019-04-11 NOTE — Progress Notes (Signed)
PROGRESS NOTE    [name]  XLK:[id number] DOB: [birth year] DOA: 04/01/2019 PCP: Celene Squibb, MD   Brief Narrative: 86 YOF with significant history of anemia/osteoarthritis, left breast cancer, cataracts, CKD IV, liver cirrhosis, T2DM on long-term insulin, GERD, hyperlipidemia, hypertension history of multiple falls in the past, nocturia, seasonal allergies, history of facial melanoma with surgical excision in 2012 comes to the ER with multiple episodes of nausea vomiting and diarrhea following her chemotherapy cycle this week without melena hematochezia dysuria but with low-grade fever since Saturday, decreased oral intake for several days. In the ED vitals are stable UA CBC and CMP shows metabolic acidosis/CKD chronic.  LFTs lipase normal COVID-19) Shambley negative, underwent chest x-ray no acute finding, and CT abdomen without contrast showed sigmoid diverticulitis, liver cirrhosis.  Patient was placed on IV antibiotic and was admitted on liquid diet.  And by oncology managed conservatively with liquid diet IV fluids and IV antibiotics.  C. Difficile  was done given that patient is on chemo immunosuppressive and was having abdominal pain diarrhea.  Despite leukocytosis as could be due to chemo came back negative.  Subjective:  No nausea/vomitting Had diarrhea last night- took imodium. C/o mouth sorenesss. Feels some better, feels very weak and would like to sit at bedside chair for meal. CDiff neg  Assessment & Plan:  Nausea vomiting diarrhea, secondary to Sigmoid diverticulitis, also contributed by chemotherapy as well.  Overall improving.  C. difficile is negative.  Keep on IV Cipro Flagyl IV fluids, advance diet to full liquid diet.  Encourage PT, consulted.  Continue pain control continue to monitor closely.    Mouth ulcer continue her Valtrex.  Continue Magic mouthwash  Dehydration/hypovolemia: Due to #1.  BUN is still up at 55,  baseline around 20s. Cont on ivf.  HTN:  Well-controlled on amlodipine, metoprolol.  Hypokalemia resolved.  On kcl in ivf  DM type 2 on insulin, uncontrolled A1c 11.6 04/25/2019, uncontrolled hyperglycemia: Hold glipizide, continue Lantus at 30 units daily , once tolerating diet will resume her full dose - she takes [place] 50 units daily.  Continue sliding scale.  Hyperlipidemia: Resume statin  Chronic kidney disease stage IV/metabolic acidosis, chronic baseline creatinine 2.1-2.5.  Monitor.  GERD: Continue on PPI  Recurrent breast adenocarcinoma, left: Cepacetabine on hold.   Xeloda chemo, oncology has been consulted.  Cirrhosis with portal hypertension: Appears compensated.  Bilirubin slightly abnormal.  Monitor.  Morbid obesity with Body mass index is 39.74 kg/m.   DVT prophylaxis: SCD.  Heparin. Communication: plan of care discussed with patient at bedside.  Discussed with the daughter over the phone 84/11.  Discussed with oncology team today. Disposition Plan: Patient is from: Home Anticipated Disposition: Home in 1-2 days consulted PT.   Barriers to discharge or conditions that needs to be met prior to discharge: Once diarrhea resolves and tolerating diet.  Needs IV fluids, IV antibiotics.   Consultants: Oncology Procedures:see note Microbiology:see note  Medications: Scheduled Meds: . amLODipine  5 mg Oral q morning - 10a  . aspirin EC  81 mg Oral Daily  . atorvastatin  10 mg Oral QPM  . brimonidine  1 drop Both Eyes BID  . budesonide  2 mL Inhalation BID  . cholestyramine light  4 g Oral 2 times per day  . dorzolamide  1 drop Both Eyes BID  . fluconazole  200 mg Oral Daily  . gabapentin  600 mg Oral Daily  . heparin  5,000 Units Subcutaneous Q8H  . insulin  aspart  0-15 Units Subcutaneous TID WC  . insulin glargine  20 Units Subcutaneous Daily  . ipratropium-albuterol  3 mL Nebulization TID  . latanoprost  1 drop Both Eyes QHS  . magic mouthwash w/lidocaine  5 mL Oral QID  . metoprolol succinate  100 mg  Oral q morning - 10a  . mirabegron ER  50 mg Oral Daily  . mirtazapine  15 mg Oral QHS  . valACYclovir  500 mg Oral BID   Continuous Infusions: . 0.9 % NaCl with KCl 20 mEq / L 75 mL/hr at 04/10/19 1134  . ciprofloxacin 400 mg (04/10/19 1135)  . metronidazole 500 mg (04/11/19 0534)    Antimicrobials: Anti-infectives (From admission, onward)   Start     Dose/Rate Route Frequency Ordered Stop   04/10/19 1200  ciprofloxacin (CIPRO) IVPB 400 mg     400 mg 200 mL/hr over 60 Minutes Intravenous Every 24 hours 04/23/2019 1530     04/10/19 0000  ciprofloxacin (CIPRO) IVPB 400 mg  Status:  Discontinued     400 mg 200 mL/hr over 60 Minutes Intravenous Every 12 hours 04/21/2019 1523 04/16/2019 1530   04/25/2019 2200  metroNIDAZOLE (FLAGYL) IVPB 500 mg     500 mg 100 mL/hr over 60 Minutes Intravenous Every 8 hours 03/31/2019 1523     03/31/2019 2200  valACYclovir (VALTREX) tablet 500 mg     500 mg Oral 2 times daily 04/16/2019 1740     04/10/2019 1830  fluconazole (DIFLUCAN) tablet 200 mg     200 mg Oral Daily 04/08/2019 1740     03/31/2019 1115  ciprofloxacin (CIPRO) IVPB 400 mg     400 mg 200 mL/hr over 60 Minutes Intravenous  Once 04/13/2019 1101 04/24/2019 1339   04/19/2019 1115  metroNIDAZOLE (FLAGYL) IVPB 500 mg     500 mg 100 mL/hr over 60 Minutes Intravenous  Once 04/10/2019 1101 04/23/2019 1339       Objective: Vitals:   04/10/19 0540 04/10/19 1214 04/10/19 2013 04/11/19 0558  BP: 135/80 130/62 126/67 118/71  Pulse: 98 95 84 77  Resp:  _0 Temp: 99.2 F (37.3 C) 98.6 F (37 C) 98.2 F (36.8 C) 98.3 F (36.8 C)  TempSrc: Oral Oral Oral Oral  SpO2: 91% 95% 94% 92%  Weight:      Height:        Intake/Output Summary (Last 24 hours) at 04/11/2019 0817 Last data filed at 04/10/2019 0930 Gross per 24 hour  Intake 240 ml  Output --  Net 240 ml   Filed Weights   04/27/2019 1809  Weight: 92.3 kg   Weight change:   Body mass index is 39.74 kg/m.  Intake/Output from previous day: 02/11  0701 - 02/12 0700 In: 240 [P.O.:240] Out: -  Intake/Output this shift: No intake/output data recorded.  Examination:  General exam: Alert awake oriented, obese, not in distress.   HEENT:Oral mucosa moist, Ear/Nose WNL grossly, dentition normal. Respiratory system: bilaterally clear,no wheezing or crackles,no use of accessory muscle Cardiovascular system: S1 & S2 +, No JVD,. Gastrointestinal system: Abdomen soft, tender on the left lower quadrant, obese abdomen, bowel sounds present. Nervous System:Alert, awake, moving extremities and grossly nonfocal Extremities: No edema, distal peripheral pulses palpable.  Skin: No rashes,no icterus. MSK: Normal muscle bulk,tone, power  Data Reviewed: I have personally reviewed following labs and imaging studies  CBC: Recent Labs  Lab 04/08/19 1420 04/25/2019 1035 04/10/19 0454 04/11/19 0729  WBC 8.3 10.3 10.4 11.1*  NEUTROABS  6.3 8.1*  --   --   HGB 11.1* 13.2 13.3 13.0  HCT 32.4* 38.0 39.4 37.2  MCV 109.1* 109.5* 111.3* 110.4*  PLT 93* 110* 106* 096*   Basic Metabolic Panel: Recent Labs  Lab 04/08/19 1420 03/31/2019 1035 04/10/19 0454 04/11/19 0729  NA 133* 135 135 135  K 3.3* 3.5 3.4* 3.8  CL 102 105 105 109  CO2 21* 20* 17* 17*  GLUCOSE 339* 180* 260* 189*  BUN 56* 62* 55* 55*  CREATININE 2.49* 2.51* 2.15* 2.28*  CALCIUM 7.4* 7.7* 7.9* 7.8*   GFR: Estimated Creatinine Clearance: 18.9 mL/min (A) (by C-G formula based on SCr of 2.28 mg/dL (H)). Liver Function Tests: Recent Labs  Lab 04/08/19 1420 04/19/2019 1035 04/10/19 0454  AST 14* 23 19  ALT _0 ALKPHOS 57 63 54  BILITOT 0.9 1.3* 1.5*  PROT 4.4* 5.2* 4.8*  ALBUMIN 2.3* 2.6* 2.4*   Recent Labs  Lab 04/19/2019 1035  LIPASE 27   No results for input(s): AMMONIA in the last 168 hours. Coagulation Profile: Recent Labs  Lab 04/10/19 0454  INR 1.7*   Cardiac Enzymes: No results for input(s): CKTOTAL, CKMB, CKMBINDEX, TROPONINI in the last 168 hours. BNP  (last 3 results) No results for input(s): PROBNP in the last 8760 hours. HbA1C: Recent Labs    04/10/2019 1825  HGBA1C 11.6*   CBG: Recent Labs  Lab 04/10/19 0737 04/10/19 1201 04/10/19 1720 04/10/19 2015 04/11/19 0751  GLUCAP 256* 259* 278* 248* 177*   Lipid Profile: No results for input(s): CHOL, HDL, LDLCALC, TRIG, CHOLHDL, LDLDIRECT in the last 72 hours. Thyroid Function Tests: No results for input(s): TSH, T4TOTAL, FREET4, T3FREE, THYROIDAB in the last 72 hours. Anemia Panel: No results for input(s): VITAMINB12, FOLATE, FERRITIN, TIBC, IRON, RETICCTPCT in the last 72 hours. Sepsis Labs: No results for input(s): PROCALCITON, LATICACIDVEN in the last 168 hours.  Recent Results (from the past 240 hour(s))  Respiratory Panel by RT PCR (Flu A&B, Covid) - Nasopharyngeal Swab     Status: None   Collection Time: 04/17/2019 11:02 AM   Specimen: Nasopharyngeal Swab  Result Value Ref Range Status   SARS Coronavirus 2 by RT PCR NEGATIVE NEGATIVE Final    Comment: (NOTE) SARS-CoV-2 target nucleic acids are NOT DETECTED. The SARS-CoV-2 RNA is generally detectable in upper respiratoy specimens during the acute phase of infection. The lowest concentration of SARS-CoV-2 viral copies this assay can detect is 131 copies/mL. A negative result does not preclude SARS-Cov-2 infection and should not be used as the sole basis for treatment or other patient management decisions. A negative result may occur with  improper specimen collection/handling, submission of specimen other than nasopharyngeal swab, presence of viral mutation(s) within the areas targeted by this assay, and inadequate number of viral copies (<131 copies/mL). A negative result must be combined with clinical observations, patient history, and epidemiological information. The expected result is Negative. Fact Sheet for Patients:  PinkCheek.be Fact Sheet for Healthcare Providers:   GravelBags.it This test is not yet ap proved or cleared by the [place] FDA and  has been authorized for detection and/or diagnosis of SARS-CoV-2 by FDA under an Emergency Use Authorization (EUA). This EUA will remain  in effect (meaning this test can be used) for the duration of the COVID-19 declaration under Section 564(b)(1) of the Act, 21 U.S.C. section 360bbb-3(b)(1), unless the authorization is terminated or revoked sooner.    Influenza A by PCR NEGATIVE NEGATIVE Final   Influenza  B by PCR NEGATIVE NEGATIVE Final    Comment: (NOTE) The Xpert Xpress SARS-CoV-2/FLU/RSV assay is intended as an aid in  the diagnosis of influenza from Nasopharyngeal swab specimens and  should not be used as a sole basis for treatment. Nasal washings and  aspirates are unacceptable for Xpert Xpress SARS-CoV-2/FLU/RSV  testing. Fact Sheet for Patients: PinkCheek.be Fact Sheet for Healthcare Providers: GravelBags.it This test is not yet approved or cleared by the [place] FDA and  has been authorized for detection and/or diagnosis of SARS-CoV-2 by  FDA under an Emergency Use Authorization (EUA). This EUA will remain  in effect (meaning this test can be used) for the duration of the  Covid-19 declaration under Section 564(b)(1) of the Act, 21  U.S.C. section 360bbb-3(b)(1), unless the authorization is  terminated or revoked. Performed at [facility], [place] [address] [place], [place] [address]   MRSA PCR Screening     Status: None   Collection Time: 04/04/2019  6:38 PM   Specimen: Nasopharyngeal  Result Value Ref Range Status   MRSA by PCR NEGATIVE NEGATIVE Final    Comment:        The GeneXpert MRSA Assay (FDA approved for NASAL specimens only), is one component of a comprehensive MRSA colonization surveillance program. It is not intended to diagnose MRSA infection nor to guide  or monitor treatment for MRSA infections. Performed at [facility], [place] [address], [place], [place] [address]   C difficile quick scan w PCR reflex     Status: None   Collection Time: 04/10/19  6:06 PM   Specimen: STOOL  Result Value Ref Range Status   C Diff antigen NEGATIVE NEGATIVE Final   C Diff toxin NEGATIVE NEGATIVE Final   C Diff interpretation No C. difficile detected.  Final    Comment: Performed at [facility], [place] [address], [place], [place] [address]      Radiology Studies: CT ABDOMEN PELVIS WO CONTRAST  Result Date: 04/08/2019 CLINICAL DATA:  Generalized abdominal pain. EXAM: CT ABDOMEN AND PELVIS WITHOUT CONTRAST TECHNIQUE: Multidetector CT imaging of the abdomen and pelvis was performed following the standard protocol without IV contrast due to renal insufficiency. COMPARISON:  August 29th 2020. FINDINGS: Lower chest: No acute abnormality. Hepatobiliary: Status post cholecystectomy. No biliary dilatation is noted. Nodular hepatic contours are noted consistent with hepatic cirrhosis. No focal hepatic abnormality is noted on these unenhanced images. Pancreas: Unremarkable. No pancreatic ductal dilatation or surrounding inflammatory changes. Spleen: Stable 17 mm low density is noted in anterior portion of the spleen. Stable mild splenomegaly is noted. Adrenals/Urinary Tract: Adrenal glands appear normal. Left renal atrophy is noted. No hydronephrosis or renal obstruction is noted. No renal or ureteral calculi are noted. Urinary bladder is unremarkable. Stomach/Bowel: The stomach appears normal. There is no evidence of bowel obstruction. The appendix is not clearly visualized, but no inflammation is noted in the right lower quadrant. However, diverticulosis is noted involving the sigmoid colon with surrounding inflammation consistent with diverticulitis. Vascular/Lymphatic: Atherosclerosis of abdominal aorta is noted without aneurysm formation.  No significant adenopathy is noted. Multiple collateral veins are noted in the retroperitoneal region consistent with portal hypertension. Reproductive: Status post hysterectomy. No adnexal masses. Other: No abdominal wall hernia or abnormality. No abdominopelvic ascites. Musculoskeletal: Extensive postsurgical and degenerative changes are noted in the lumbar spine. No acute abnormality is noted. IMPRESSION: 1. Findings consistent with sigmoid diverticulitis. No abscess is noted. 2. Findings consistent with hepatic cirrhosis with  associated portal hypertension and mild splenomegaly. 3. Left renal atrophy is noted. No hydronephrosis or renal obstruction is noted. 4. Aortic atherosclerosis. Aortic Atherosclerosis (ICD10-I70.0). Electronically Signed   By: Marijo Conception M.D.   On: 04/20/2019 10:42   CT CHEST WO CONTRAST  Result Date: 04/14/2019 CLINICAL DATA:  Breast cancer surveillance and staging. EXAM: CT CHEST WITHOUT CONTRAST TECHNIQUE: Multidetector CT imaging of the chest was performed following the standard protocol without IV contrast. COMPARISON:  Chest radiography same day.  CT 10/26/2018 FINDINGS: Cardiovascular: Atherosclerosis of the aorta and its branch vessels as seen previously. Coronary artery calcification. Heart size is normal. No pericardial fluid. Mediastinum/Nodes: No mediastinal or hilar mass or lymphadenopathy. Lungs/Pleura: No sign of pulmonary metastatic disease. Few areas of pulmonary scarring. No pleural fluid. Upper Abdomen: Chronic cirrhosis of the liver. No acute abdominal finding. Very small amount of fluid adjacent to the liver edge. Musculoskeletal: Left breast skin thickening with internal breast density, nonspecific by CT and fairly similar to the previous study. No sign of osseous metastatic disease. Chronic degenerative change of the spine in the lower thoracic and upper lumbar region, subsequent to previous lumbar fusion. No significant change since the previous study.  IMPRESSION: No sign of metastatic disease in the chest region. Skin thickening of the left breast with internal left breast density, nonspecific by CT. Aortic atherosclerosis.  Coronary artery calcification. Chronic thoracolumbar degenerative changes without evidence bony metastatic disease or significant change since last year. Electronically Signed   By: Nelson Chimes M.D.   On: 04/23/2019 19:41   DG Chest Portable 1 View  Result Date: 04/17/2019 CLINICAL DATA:  86 year old with current history of recurrent estrogen receptor positive breast cancer for which the patient is undergoing chemotherapy, presenting with acute onset of myalgias, nausea and diarrhea that began yesterday. EXAM: PORTABLE CHEST 1 VIEW COMPARISON:  CT chest 10/26/2018. Chest x-rays 10/26/2018 and earlier. FINDINGS: Cardiac silhouette upper normal in size for AP portable technique, unchanged. Thoracic aorta atherosclerotic, unchanged. Prominent central RIGHT pulmonary vessels, unchanged. Bronchiectasis involving the RIGHT LOWER LOBE, unchanged. Lungs clear. No confluent or ground-glass airspace consolidation. Normal pulmonary vascularity. No visible pleural effusions. IMPRESSION: No acute cardiopulmonary disease. Stable bronchiectasis involving the RIGHT LOWER LOBE. Electronically Signed   By: Evangeline Dakin M.D.   On: 04/13/2019 09:44     LOS: 1 day   Time spent: More than 50% of that time was spent in counseling and/or coordination of care.  Antonieta Pert, MD [facility]  04/11/2019, 8:17 AM

## 2019-04-11 NOTE — Evaluation (Signed)
Physical Therapy Evaluation Patient Details Name: [name] MRN: [id number] DOB: December 15, 1933 Today's Date: 04/11/2019   History of Present Illness  75 YOF with significant history of anemia/osteoarthritis, left breast cancer, cataracts, CKD IV, liver cirrhosis, T2DM on long-term insulin, GERD, hyperlipidemia, hypertension history of multiple falls in the past, nocturia, seasonal allergies, history of facial melanoma with surgical excision in 2012 comes to the ER with multiple episodes of nausea vomiting and diarrhea following her chemotherapy cycle this week  Clinical Impression  The patient reports mouth has sores and painful, hands are numbish and painful. Patient did participate in rolling, sat up at bed edge briefly but needed bedpan so returned to supine. Patient immediately had loose BM.  Patient lives with daughter. Patient was ambulating PTA with RW. Pt admitted with above diagnosis.  Pt currently with functional limitations due to the deficits listed below (see PT Problem List). Pt will benefit from skilled PT to increase their independence and safety with mobility to allow discharge to the venue listed below.       Follow Up Recommendations Home health PT    Equipment Recommendations  (may benefit from [facility] chair)    Recommendations for Other Services   OT    Precautions / Restrictions Precautions Precautions: Fall Precaution Comments: diarrea      Mobility  Bed Mobility Overal bed mobility: Needs Assistance Bed Mobility: Rolling;Sidelying to Sit;Sit to Supine Rolling: Supervision Sidelying to sit: Mod assist;HOB elevated   Sit to supine: Mod assist   General bed mobility comments: use of rais to roll, assist with trunk to sit up. Able to get legs on bed without assistance to return to supine. Patient needed BP urgently.  Transfers                 General transfer comment: NT, needed BP  Ambulation/Gait                Stairs             Wheelchair Mobility    Modified Rankin (Stroke Patients Only)       Balance Overall balance assessment: Needs assistance Sitting-balance support: Bilateral upper extremity supported;Feet unsupported Sitting balance-Leahy Scale: Fair Sitting balance - Comments: sits without support                                     Pertinent Vitals/Pain Pain Assessment: Faces Faces Pain Scale: Hurts whole lot Pain Location: mouth and general body Pain Descriptors / Indicators: Aching;Discomfort;Numbness;Guarding Pain Intervention(s): Monitored during session;Patient requesting pain meds-RN notified;Limited activity within patient's tolerance    Home Living Family/patient expects to be discharged to:: Private residence Living Arrangements: Children Available Help at Discharge: Family Type of Home: House Home Access: Stairs to enter Entrance Stairs-Rails: Right;Can reach both Entrance Stairs-Number of Steps: 4 at her daughters and 2 stairs at her home   Home Equipment: Kasandra Knudsen - single point;Shower seat;Hand held Tourist information centre manager - 4 wheels;Grab bars - tub/shower;Grab bars - toilet Additional Comments: patient  staying with daughter, mhas assist with ADL's    Prior Function Level of Independence: Needs assistance   Gait / Transfers Assistance Needed: household ambulator with Rollator  ADL's / Homemaking Assistance Needed: assisted by daughter as needed for ADLs        Hand Dominance        Extremity/Trunk Assessment   Upper Extremity Assessment Upper Extremity Assessment: Generalized weakness  Lower Extremity Assessment Lower Extremity Assessment: Generalized weakness    Cervical / Trunk Assessment Cervical / Trunk Assessment: Normal  Communication      Cognition Arousal/Alertness: Awake/alert Behavior During Therapy: WFL for tasks assessed/performed Overall Cognitive Status: Within Functional Limits for tasks assessed                                         General Comments      Exercises     Assessment/Plan    PT Assessment Patient needs continued PT services  PT Problem List Decreased strength;Decreased mobility;Decreased knowledge of precautions;Decreased activity tolerance;Obesity;Decreased balance;Pain;Decreased knowledge of use of DME       PT Treatment Interventions DME instruction;Therapeutic activities;Gait training;Therapeutic exercise;Patient/family education;Functional mobility training    PT Goals (Current goals can be found in the Care Plan section)  Acute Rehab PT Goals Patient Stated Goal: to not have Diarrhea, go home PT Goal Formulation: With patient Time For Goal Achievement: 04/25/19 Potential to Achieve Goals: Good    Frequency Min 3X/week   Barriers to discharge        Co-evaluation               AM-PAC PT "6 Clicks" Mobility  Outcome Measure Help needed turning from your back to your side while in a flat bed without using bedrails?: A Little Help needed moving from lying on your back to sitting on the side of a flat bed without using bedrails?: A Lot Help needed moving to and from a bed to a chair (including a wheelchair)?: A Lot Help needed standing up from a chair using your arms (e.g., wheelchair or bedside chair)?: A Lot Help needed to walk in hospital room?: Total Help needed climbing 3-5 steps with a railing? : Total 6 Click Score: 11    End of Session   Activity Tolerance: Patient limited by fatigue;Patient limited by pain;Other (comment)(needed BP) Patient left: in bed;with call bell/phone within reach;with bed alarm set Nurse Communication: Mobility status PT Visit Diagnosis: Unsteadiness on feet (R26.81);Difficulty in walking, not elsewhere classified (R26.2);Pain Pain - Right/Left: Right Pain - part of body: Hand    Time: 3149-7026 PT Time Calculation (min) (ACUTE ONLY): 34 min   Charges:   PT Evaluation $PT Eval Moderate Complexity: 1 Mod PT  Treatments $Therapeutic Activity: 8-22 mins        [name] PT Acute Rehabilitation Services Pager [phone] Office [phone]   [name] 04/11/2019, 10:31 AM

## 2019-04-12 LAB — COMPREHENSIVE METABOLIC PANEL
ALT: 9 U/L (ref 0–44)
AST: 18 U/L (ref 15–41)
Albumin: 2.1 g/dL — ABNORMAL LOW (ref 3.5–5.0)
Alkaline Phosphatase: 58 U/L (ref 38–126)
Anion gap: 9 (ref 5–15)
BUN: 53 mg/dL — ABNORMAL HIGH (ref 8–23)
CO2: 15 mmol/L — ABNORMAL LOW (ref 22–32)
Calcium: 7.8 mg/dL — ABNORMAL LOW (ref 8.9–10.3)
Chloride: 106 mmol/L (ref 98–111)
Creatinine, Ser: 2.21 mg/dL — ABNORMAL HIGH (ref 0.44–1.00)
GFR calc Af Amer: 23 mL/min — ABNORMAL LOW (ref >60)
GFR calc non Af Amer: 20 mL/min — ABNORMAL LOW (ref >60)
Glucose, Bld: 275 mg/dL — ABNORMAL HIGH (ref 70–99)
Potassium: 4 mmol/L (ref 3.5–5.1)
Sodium: 130 mmol/L — ABNORMAL LOW (ref 135–145)
Total Bilirubin: 1.3 mg/dL — ABNORMAL HIGH (ref 0.3–1.2)
Total Protein: 4.1 g/dL — ABNORMAL LOW (ref 6.5–8.1)

## 2019-04-12 LAB — GLUCOSE, CAPILLARY
Glucose-Capillary: 237 mg/dL — ABNORMAL HIGH (ref 70–99)
Glucose-Capillary: 233 mg/dL — ABNORMAL HIGH (ref 70–99)
Glucose-Capillary: 242 mg/dL — ABNORMAL HIGH (ref 70–99)
Glucose-Capillary: 242 mg/dL — ABNORMAL HIGH (ref 70–99)

## 2019-04-12 LAB — CBC
HCT: 39.3 % (ref 36.0–46.0)
Hemoglobin: 13.3 g/dL (ref 12.0–15.0)
MCH: 37.9 pg — ABNORMAL HIGH (ref 26.0–34.0)
MCHC: 33.8 g/dL (ref 30.0–36.0)
MCV: 112 fL — ABNORMAL HIGH (ref 80.0–100.0)
Platelets: 141 10*3/uL — ABNORMAL LOW (ref 150–400)
RBC: 3.51 MIL/uL — ABNORMAL LOW (ref 3.87–5.11)
RDW: 16.8 % — ABNORMAL HIGH (ref 11.5–15.5)
WBC: 10.3 10*3/uL (ref 4.0–10.5)
nRBC: 0.6 % — ABNORMAL HIGH (ref 0.0–0.2)

## 2019-04-12 MED ORDER — INSULIN GLARGINE 100 UNIT/ML ~~LOC~~ SOLN
40.0000 [IU] | Freq: Every day | SUBCUTANEOUS | Status: DC
  Filled 2019-04-12: qty 0.4

## 2019-04-12 MED ORDER — PROMETHAZINE HCL 25 MG/ML IJ SOLN
6.2500 mg | Freq: Three times a day (TID) | INTRAMUSCULAR | Status: DC | PRN
  Administered 2019-04-12 – 2019-04-13 (×2): 6.25 mg via INTRAVENOUS
  Filled 2019-04-12 (×2): qty 1

## 2019-04-12 NOTE — Progress Notes (Signed)
PROGRESS NOTE    [name]  WNI:[id number] DOB: [birth year] DOA: 04/17/2019 PCP: Celene Squibb, MD   Brief Narrative: 63 YOF with significant history of anemia/osteoarthritis, left breast cancer, cataracts, CKD IV, liver cirrhosis, T2DM on long-term insulin, GERD, hyperlipidemia, hypertension history of multiple falls in the past, nocturia, seasonal allergies, history of facial melanoma with surgical excision in 2012 comes to the ER with multiple episodes of nausea vomiting and diarrhea following her chemotherapy cycle this week without melena hematochezia dysuria but with low-grade fever since Saturday, decreased oral intake for several days. In the ED vitals are stable UA CBC and CMP shows metabolic acidosis/CKD chronic.  LFTs lipase normal COVID-19) [place] negative, underwent chest x-ray no acute finding, and CT abdomen without contrast showed sigmoid diverticulitis, liver cirrhosis.  Patient was placed on IV antibiotic and was admitted on liquid diet.  And by oncology managed conservatively with liquid diet IV fluids and IV antibiotics.  C. Difficile  was done given that patient is on chemo immunosuppressive and was having abdominal pain diarrhea.  Despite leukocytosis as could be due to chemo came back negative.  Subjective:  Still having loose BM this am and during night C/o mouth burning- and pills helping when she takes them. Whenever she drinks even jello or juice " It's sets mouth on fire" Feels weak.On RA. Has not gotten up and worried she will have loose bowel when up.  Assessment & Plan:  Nausea vomiting diarrhea, secondary to Sigmoid diverticulitis, also contributed by chemotherapy as well.  Still having loose stool, complains of mouth burning not tolerating diet well-and at risk for dehydration. C. difficile was negative.  We will continue on current IV Cipro Flagyl IV fluids, full liquid diet, Valtrex and Magic mouthwash.Encourage PT.Still having diarrhea when up.  Mouth  ulcer likely 2/2 chemo. Continue her Valtrex/mouthwash, supportive care.    Dehydration/hypovolemia: Due to #1.  BUN  55-53. baseline around 20s. Cont on ivf.  Hyponatremia- from dehydration/poor intake- cont ivf, encouraged po, unable to tolerate po well due to mouth burning  HTN: Well-controlled on amlodipine, metoprolol.  Hypokalemia resolved.  On kcl in ivf  DM type 2 on insulin, uncontrolled A1c 11.6 03/31/2019, uncontrolled hyperglycemia: Hold glipizide, continue Lantus , increase to 40 units 2/13. Once tolerating diet will resume her full dose - she takes [place] 50 units daily.  Continue sliding scale.  Hyperlipidemia:Cont statin  Chronic kidney disease stage IV/metabolic acidosis, chronic baseline creatinine 2.1-2.5.  Creatinine at 2.2, stable.Monitor.  GERD: Continue on PPI  Recurrent breast adenocarcinoma, left: Cepacetabine on hold.   Xeloda chemo, oncology has been consulted and will follow up as outpatient.  Cirrhosis with portal hypertension: Appears compensated.  Bilirubin slightly abnormal.  Monitor.  Morbid obesity with Body mass index is 39.74 kg/m.   Rashes:recently seen by dermatology.  DVT prophylaxis: SCD.  Heparin. Communication: plan of care discussed with patient at bedside.  Called and updated patient's daughter again today.  Disposition Plan: Patient is from: Home Anticipated Disposition: Home in 1-2 days  w/ HHPT. Barriers to discharge or conditions that needs to be met prior to discharge: Once tolerating diet diarrhea resolves . Remains inpatient for IV fluids, IV antibiotics.   Consultants: Oncology Procedures:see note Microbiology:see note   Medications: Scheduled Meds: . amLODipine  5 mg Oral q morning - 10a  . aspirin EC  81 mg Oral Daily  . atorvastatin  10 mg Oral QPM  . brimonidine  1 drop Both Eyes BID  . budesonide  2 mL Inhalation BID  . cholestyramine light  4 g Oral 2 times per day  . dorzolamide  1 drop Both Eyes BID  .  fluconazole  200 mg Oral Daily  . gabapentin  600 mg Oral Daily  . heparin  5,000 Units Subcutaneous Q8H  . insulin aspart  0-15 Units Subcutaneous TID WC  . insulin glargine  30 Units Subcutaneous Daily  . ipratropium-albuterol  3 mL Nebulization TID  . latanoprost  1 drop Both Eyes QHS  . magic mouthwash w/lidocaine  5 mL Oral QID  . metoprolol succinate  100 mg Oral q morning - 10a  . mirabegron ER  50 mg Oral Daily  . mirtazapine  15 mg Oral QHS  . valACYclovir  500 mg Oral BID   Continuous Infusions: . 0.9 % NaCl with KCl 20 mEq / L 75 mL/hr at 04/12/19 0048  . ciprofloxacin 400 mg (04/11/19 1400)  . metronidazole 500 mg (04/12/19 9371)    Antimicrobials: Anti-infectives (From admission, onward)   Start     Dose/Rate Route Frequency Ordered Stop   04/10/19 1200  ciprofloxacin (CIPRO) IVPB 400 mg     400 mg 200 mL/hr over 60 Minutes Intravenous Every 24 hours 04/24/2019 1530     04/10/19 0000  ciprofloxacin (CIPRO) IVPB 400 mg  Status:  Discontinued     400 mg 200 mL/hr over 60 Minutes Intravenous Every 12 hours 04/12/2019 1523 04/10/2019 1530   04/08/2019 2200  metroNIDAZOLE (FLAGYL) IVPB 500 mg     500 mg 100 mL/hr over 60 Minutes Intravenous Every 8 hours 04/14/2019 1523     04/12/2019 2200  valACYclovir (VALTREX) tablet 500 mg     500 mg Oral 2 times daily 04/10/2019 1740     04/19/2019 1830  fluconazole (DIFLUCAN) tablet 200 mg     200 mg Oral Daily 04/15/2019 1740     04/26/2019 1115  ciprofloxacin (CIPRO) IVPB 400 mg     400 mg 200 mL/hr over 60 Minutes Intravenous  Once 04/15/2019 1101 04/21/2019 1339   04/13/2019 1115  metroNIDAZOLE (FLAGYL) IVPB 500 mg     500 mg 100 mL/hr over 60 Minutes Intravenous  Once 04/07/2019 1101 04/16/2019 1339       Objective: Vitals:   04/11/19 2032 04/11/19 2158 04/12/19 0617 04/12/19 0741  BP: 120/71  120/66   Pulse: 80  77   Resp: 18  18   Temp: 97.7 F (36.5 C)  97.8 F (36.6 C)   TempSrc: Oral  Oral   SpO2: 93% 91% 94% 94%  Weight:        Height:        Intake/Output Summary (Last 24 hours) at 04/12/2019 0854 Last data filed at 04/11/2019 1700 Gross per 24 hour  Intake 240 ml  Output --  Net 240 ml   Filed Weights   04/24/2019 1809  Weight: 92.3 kg   Weight change:   Body mass index is 39.74 kg/m.  Intake/Output from previous day: 02/12 0701 - 02/13 0700 In: 240 [P.O.:240] Out: -  Intake/Output this shift: No intake/output data recorded.  Examination:  General exam: Alert awake oriented, obese, on RA, NAD HEENT:Oral mucosa moist, Ear/Nose WNL grossly, dentition normal. Respiratory system: bilaterally clear,no wheezing or crackles,no use of accessory muscle Cardiovascular system: S1 & S2 +, No JVD,. Gastrointestinal system: Abdomen soft and mild tenderness on LLQ, Obese abdomen,bowel sounds present. Nervous System:Alert, awake, moving extremities and grossly nonfocal Extremities: No edema, distal peripheral pulses palpable.  Skin: rashes on arms,no icterus. MSK: Normal muscle bulk,tone, power  Data Reviewed: I have personally reviewed following labs and imaging studies  CBC: Recent Labs  Lab 04/08/19 1420 04/26/2019 1035 04/10/19 0454 04/11/19 0729 04/12/19 0439  WBC 8.3 10.3 10.4 11.1* 10.3  NEUTROABS 6.3 8.1*  --   --   --   HGB 11.1* 13.2 13.3 13.0 13.3  HCT 32.4* 38.0 39.4 37.2 39.3  MCV 109.1* 109.5* 111.3* 110.4* 112.0*  PLT 93* 110* 106* 128* 229*   Basic Metabolic Panel: Recent Labs  Lab 04/08/19 1420 04/17/2019 1035 04/10/19 0454 04/11/19 0729 04/12/19 0439  NA 133* 135 135 135 130*  K 3.3* 3.5 3.4* 3.8 4.0  CL 102 105 105 109 106  CO2 21* 20* 17* 17* 15*  GLUCOSE 339* 180* 260* 189* 275*  BUN 56* 62* 55* 55* 53*  CREATININE 2.49* 2.51* 2.15* 2.28* 2.21*  CALCIUM 7.4* 7.7* 7.9* 7.8* 7.8*   GFR: Estimated Creatinine Clearance: 19.5 mL/min (A) (by C-G formula based on SCr of 2.21 mg/dL (H)). Liver Function Tests: Recent Labs  Lab 04/08/19 1420 04/25/2019 1035 04/10/19 0454  04/12/19 0439  AST 14* '23 19 18  '$ ALT '8 12 11 9  '$ ALKPHOS 57 63 54 58  BILITOT 0.9 1.3* 1.5* 1.3*  PROT 4.4* 5.2* 4.8* 4.1*  ALBUMIN 2.3* 2.6* 2.4* 2.1*   Recent Labs  Lab 04/24/2019 1035  LIPASE 27   No results for input(s): AMMONIA in the last 168 hours. Coagulation Profile: Recent Labs  Lab 04/10/19 0454  INR 1.7*   Cardiac Enzymes: No results for input(s): CKTOTAL, CKMB, CKMBINDEX, TROPONINI in the last 168 hours. BNP (last 3 results) No results for input(s): PROBNP in the last 8760 hours. HbA1C: Recent Labs    04/05/2019 1825  HGBA1C 11.6*   CBG: Recent Labs  Lab 04/11/19 0751 04/11/19 1159 04/11/19 1714 04/11/19 2031 04/12/19 0750  GLUCAP 177* 258* 273* 284* 237*   Lipid Profile: No results for input(s): CHOL, HDL, LDLCALC, TRIG, CHOLHDL, LDLDIRECT in the last 72 hours. Thyroid Function Tests: No results for input(s): TSH, T4TOTAL, FREET4, T3FREE, THYROIDAB in the last 72 hours. Anemia Panel: No results for input(s): VITAMINB12, FOLATE, FERRITIN, TIBC, IRON, RETICCTPCT in the last 72 hours. Sepsis Labs: No results for input(s): PROCALCITON, LATICACIDVEN in the last 168 hours.  Recent Results (from the past 240 hour(s))  Respiratory Panel by RT PCR (Flu A&B, Covid) - Nasopharyngeal Swab     Status: None   Collection Time: 04/07/2019 11:02 AM   Specimen: Nasopharyngeal Swab  Result Value Ref Range Status   SARS Coronavirus 2 by RT PCR NEGATIVE NEGATIVE Final    Comment: (NOTE) SARS-CoV-2 target nucleic acids are NOT DETECTED. The SARS-CoV-2 RNA is generally detectable in upper respiratoy specimens during the acute phase of infection. The lowest concentration of SARS-CoV-2 viral copies this assay can detect is 131 copies/mL. A negative result does not preclude SARS-Cov-2 infection and should not be used as the sole basis for treatment or other patient management decisions. A negative result may occur with  improper specimen collection/handling, submission of  specimen other than nasopharyngeal swab, presence of viral mutation(s) within the areas targeted by this assay, and inadequate number of viral copies (<131 copies/mL). A negative result must be combined with clinical observations, patient history, and epidemiological information. The expected result is Negative. Fact Sheet for Patients:  PinkCheek.be Fact Sheet for Healthcare Providers:  GravelBags.it This test is not yet ap proved or cleared by the [place]  States FDA and  has been authorized for detection and/or diagnosis of SARS-CoV-2 by FDA under an Emergency Use Authorization (EUA). This EUA will remain  in effect (meaning this test can be used) for the duration of the COVID-19 declaration under Section 564(b)(1) of the Act, 21 U.S.C. section 360bbb-3(b)(1), unless the authorization is terminated or revoked sooner.    Influenza A by PCR NEGATIVE NEGATIVE Final   Influenza B by PCR NEGATIVE NEGATIVE Final    Comment: (NOTE) The Xpert Xpress SARS-CoV-2/FLU/RSV assay is intended as an aid in  the diagnosis of influenza from Nasopharyngeal swab specimens and  should not be used as a sole basis for treatment. Nasal washings and  aspirates are unacceptable for Xpert Xpress SARS-CoV-2/FLU/RSV  testing. Fact Sheet for Patients: PinkCheek.be Fact Sheet for Healthcare Providers: GravelBags.it This test is not yet approved or cleared by the [place] FDA and  has been authorized for detection and/or diagnosis of SARS-CoV-2 by  FDA under an Emergency Use Authorization (EUA). This EUA will remain  in effect (meaning this test can be used) for the duration of the  Covid-19 declaration under Section 564(b)(1) of the Act, 21  U.S.C. section 360bbb-3(b)(1), unless the authorization is  terminated or revoked. Performed at [facility], [place] [address] [place], [place] [address]   MRSA PCR Screening     Status: None   Collection Time: 04/14/2019  6:38 PM   Specimen: Nasopharyngeal  Result Value Ref Range Status   MRSA by PCR NEGATIVE NEGATIVE Final    Comment:        The GeneXpert MRSA Assay (FDA approved for NASAL specimens only), is one component of a comprehensive MRSA colonization surveillance program. It is not intended to diagnose MRSA infection nor to guide or monitor treatment for MRSA infections. Performed at [facility], [place] [address] [place], [place] [address]   C difficile quick scan w PCR reflex     Status: None   Collection Time: 04/10/19  6:06 PM   Specimen: STOOL  Result Value Ref Range Status   C Diff antigen NEGATIVE NEGATIVE Final   C Diff toxin NEGATIVE NEGATIVE Final   C Diff interpretation No C. difficile detected.  Final    Comment: Performed at [facility], [place] [address] [place], [place] [address]      Radiology Studies: No results found.   LOS: 2 days   Time spent: More than 50% of that time was spent in counseling and/or coordination of care.  Antonieta Pert, MD [facility]  04/12/2019, 8:54 AM

## 2019-04-13 ENCOUNTER — Inpatient Hospital Stay (HOSPITAL_COMMUNITY): Payer: Medicare HMO

## 2019-04-13 ENCOUNTER — Inpatient Hospital Stay: Payer: Self-pay

## 2019-04-13 LAB — LACTIC ACID, PLASMA
Lactic Acid, Venous: 2.9 mmol/L (ref 0.5–1.9)
Lactic Acid, Venous: 3.8 mmol/L (ref 0.5–1.9)
Lactic Acid, Venous: 3.2 mmol/L (ref 0.5–1.9)

## 2019-04-13 LAB — BASIC METABOLIC PANEL
Anion gap: 11 (ref 5–15)
BUN: 68 mg/dL — ABNORMAL HIGH (ref 8–23)
CO2: 14 mmol/L — ABNORMAL LOW (ref 22–32)
Calcium: 8 mg/dL — ABNORMAL LOW (ref 8.9–10.3)
Chloride: 107 mmol/L (ref 98–111)
Creatinine, Ser: 2.95 mg/dL — ABNORMAL HIGH (ref 0.44–1.00)
GFR calc Af Amer: 16 mL/min — ABNORMAL LOW (ref >60)
GFR calc non Af Amer: 14 mL/min — ABNORMAL LOW (ref >60)
Glucose, Bld: 214 mg/dL — ABNORMAL HIGH (ref 70–99)
Potassium: 4.4 mmol/L (ref 3.5–5.1)
Sodium: 132 mmol/L — ABNORMAL LOW (ref 135–145)

## 2019-04-13 LAB — CBC
HCT: 41.8 % (ref 36.0–46.0)
Hemoglobin: 14.4 g/dL (ref 12.0–15.0)
MCH: 37.7 pg — ABNORMAL HIGH (ref 26.0–34.0)
MCHC: 34.4 g/dL (ref 30.0–36.0)
MCV: 109.4 fL — ABNORMAL HIGH (ref 80.0–100.0)
Platelets: 160 10*3/uL (ref 150–400)
RBC: 3.82 MIL/uL — ABNORMAL LOW (ref 3.87–5.11)
RDW: 17.2 % — ABNORMAL HIGH (ref 11.5–15.5)
WBC: 11.1 10*3/uL — ABNORMAL HIGH (ref 4.0–10.5)
nRBC: 1.8 % — ABNORMAL HIGH (ref 0.0–0.2)

## 2019-04-13 LAB — COMPREHENSIVE METABOLIC PANEL
ALT: 11 U/L (ref 0–44)
AST: 26 U/L (ref 15–41)
Albumin: 2 g/dL — ABNORMAL LOW (ref 3.5–5.0)
Alkaline Phosphatase: 59 U/L (ref 38–126)
Anion gap: 12 (ref 5–15)
BUN: 64 mg/dL — ABNORMAL HIGH (ref 8–23)
CO2: 12 mmol/L — ABNORMAL LOW (ref 22–32)
Calcium: 8.3 mg/dL — ABNORMAL LOW (ref 8.9–10.3)
Chloride: 110 mmol/L (ref 98–111)
Creatinine, Ser: 2.64 mg/dL — ABNORMAL HIGH (ref 0.44–1.00)
GFR calc Af Amer: 19 mL/min — ABNORMAL LOW (ref >60)
GFR calc non Af Amer: 16 mL/min — ABNORMAL LOW (ref >60)
Glucose, Bld: 213 mg/dL — ABNORMAL HIGH (ref 70–99)
Potassium: 5.3 mmol/L — ABNORMAL HIGH (ref 3.5–5.1)
Sodium: 134 mmol/L — ABNORMAL LOW (ref 135–145)
Total Bilirubin: 1.6 mg/dL — ABNORMAL HIGH (ref 0.3–1.2)
Total Protein: 4 g/dL — ABNORMAL LOW (ref 6.5–8.1)

## 2019-04-13 LAB — GLUCOSE, CAPILLARY
Glucose-Capillary: 206 mg/dL — ABNORMAL HIGH (ref 70–99)
Glucose-Capillary: 197 mg/dL — ABNORMAL HIGH (ref 70–99)
Glucose-Capillary: 184 mg/dL — ABNORMAL HIGH (ref 70–99)

## 2019-04-13 LAB — BRAIN NATRIURETIC PEPTIDE: B Natriuretic Peptide: 928.2 pg/mL — ABNORMAL HIGH (ref 0.0–100.0)

## 2019-04-13 LAB — MRSA PCR SCREENING: MRSA by PCR: NEGATIVE

## 2019-04-13 MED ORDER — CHLORHEXIDINE GLUCONATE CLOTH 2 % EX PADS
6.0000 | MEDICATED_PAD | Freq: Every day | CUTANEOUS | Status: DC
  Administered 2019-04-13 – 2019-04-16 (×4): 6 via TOPICAL

## 2019-04-13 MED ORDER — PIPERACILLIN-TAZOBACTAM IN DEX 2-0.25 GM/50ML IV SOLN
2.2500 g | Freq: Four times a day (QID) | INTRAVENOUS | Status: DC
  Administered 2019-04-13 – 2019-04-15 (×8): 2.25 g via INTRAVENOUS
  Filled 2019-04-13 (×9): qty 50

## 2019-04-13 MED ORDER — SODIUM CHLORIDE 0.9 % IV BOLUS: 500.0000 mL | Freq: Once | INTRAVENOUS | Status: AC

## 2019-04-13 MED ORDER — PROMETHAZINE HCL 25 MG/ML IJ SOLN: 12.5000 mg | Freq: Four times a day (QID) | INTRAMUSCULAR | Status: DC | PRN

## 2019-04-13 MED ORDER — SODIUM CHLORIDE 0.9% FLUSH
10.0000 mL | Freq: Two times a day (BID) | INTRAVENOUS | Status: DC
  Administered 2019-04-13 – 2019-04-15 (×3): 10 mL
  Administered 2019-04-15: 10:00:00 40 mL
  Administered 2019-04-16: 11:00:00 10 mL
  Administered 2019-04-16: 23:00:00 30 mL
  Administered 2019-04-17 (×2): 10 mL

## 2019-04-13 MED ORDER — SODIUM CHLORIDE 0.9 % IV BOLUS
500.0000 mL | Freq: Once | INTRAVENOUS | Status: AC
  Administered 2019-04-13: 500 mL via INTRAVENOUS

## 2019-04-13 MED ORDER — ORAL CARE MOUTH RINSE
15.0000 mL | Freq: Two times a day (BID) | OROMUCOSAL | Status: DC
  Administered 2019-04-13 – 2019-04-17 (×9): 15 mL via OROMUCOSAL

## 2019-04-13 MED ORDER — FUROSEMIDE 10 MG/ML IJ SOLN
60.0000 mg | Freq: Three times a day (TID) | INTRAMUSCULAR | Status: DC
  Administered 2019-04-13 – 2019-04-14 (×2): 60 mg via INTRAVENOUS
  Filled 2019-04-13 (×2): qty 6

## 2019-04-13 MED ORDER — INSULIN ASPART 100 UNIT/ML ~~LOC~~ SOLN
0.0000 [IU] | Freq: Four times a day (QID) | SUBCUTANEOUS | Status: DC
  Administered 2019-04-13: 09:00:00 5 [IU] via SUBCUTANEOUS
  Administered 2019-04-13: 21:00:00 2 [IU] via SUBCUTANEOUS
  Administered 2019-04-13: 3 [IU] via SUBCUTANEOUS

## 2019-04-13 MED ORDER — INSULIN GLARGINE 100 UNIT/ML ~~LOC~~ SOLN
15.0000 [IU] | Freq: Every day | SUBCUTANEOUS | Status: DC
  Administered 2019-04-13 – 2019-04-14 (×2): 15 [IU] via SUBCUTANEOUS
  Filled 2019-04-13 (×2): qty 0.15

## 2019-04-13 MED ORDER — STERILE WATER FOR INJECTION IV SOLN
INTRAVENOUS | Status: DC
  Filled 2019-04-13 (×2): qty 850

## 2019-04-13 MED ORDER — SODIUM CHLORIDE 0.9 % IV BOLUS
1000.0000 mL | Freq: Once | INTRAVENOUS | Status: AC
  Administered 2019-04-13: 1000 mL via INTRAVENOUS

## 2019-04-13 MED ORDER — INSULIN ASPART 100 UNIT/ML ~~LOC~~ SOLN
0.0000 [IU] | Freq: Four times a day (QID) | SUBCUTANEOUS | Status: DC
  Administered 2019-04-14: 3 [IU] via SUBCUTANEOUS
  Administered 2019-04-14 (×4): 2 [IU] via SUBCUTANEOUS
  Administered 2019-04-15: 3 [IU] via SUBCUTANEOUS
  Administered 2019-04-15 – 2019-04-16 (×4): 2 [IU] via SUBCUTANEOUS

## 2019-04-13 MED ORDER — SODIUM CHLORIDE 0.9% FLUSH: 10.0000 mL | INTRAVENOUS | Status: DC | PRN

## 2019-04-13 MED ORDER — HYDRALAZINE HCL 20 MG/ML IJ SOLN: 5.0000 mg | Freq: Four times a day (QID) | INTRAMUSCULAR | Status: DC | PRN

## 2019-04-13 MED ORDER — SODIUM CHLORIDE 0.9 % IV SOLN: INTRAVENOUS | Status: DC

## 2019-04-13 NOTE — Consult Note (Addendum)
Renal Service Consult Note [facility]  [name] 04/13/2019 Sol Blazing Requesting Physician:  Dr Maren Beach  Reason for Consult:  Renal failure HPI: The patient is a 86 y.o. year-old hx of HTN, HL, DM2, cirrhosis, breast Ca admitted on 2/10 w/ vomiting and diarrhea. Getting chemoRx.  Creat 2.5 on admission , alb 2.5, UA negative. CXR stable bronchiectasis of RLL. Abd CT showed cirrhosis w/ portal HTN changes. Creat up to 2.95 today, no UOP recorded until this am. Asked to see for renal failure.    Creat 2.4 > 2.15 > 2.21 > 2.64 > 2.95 today, BUN 56 >> 68 today. No UOP recorded until today after foley placed.   Pt seen in ICU, tired, not sig SOB, no abd pain or CP.  No hx seeing kidney doctor but her PCP has been following her kidney fxn for years per the daugther. 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    Pt having blistering sores in her mouth from Xeloda.  Had hx breast rx'd 5-10 yrs ago , then recurred more recently. Was on regular chemo, is now taking Xeloda. Tumor L breast has now breaked through to the skin.  Dtr not sure of status of mets.     ROS  denies CP  no joint pain   no HA  no blurry vision  no rash  no diarrhea  no nausea/ vomiting  no dysuria  no difficulty voiding  no change in urine color    Past Medical History  Past Medical History:  Diagnosis Date  . Abrasion of arm, left    secondary to fall   . Anemia   . Arthritis   . Breast cancer, left ([place])    er/pr +  . Cataract    no surgery as yet,starting of 3 years  . Chronic kidney disease    renal stones, one episode of Lithotripsy  . Cirrhosis (H[facility])    Metavir score 4  . Complication of anesthesia   . Diabetes mellitus   . GERD (gastroesophageal reflux disease)   . Hyperlipidemia   . Hypertension    followed by [facility]. relative to  urology study grp.  Doesn't report ever having a stress test   . Mental disorder   . Multiple falls   . Nocturia   . PONV (postoperative  nausea and vomiting)   . Seasonal allergies   . Skin cancer    face- melanoma, 2012- surg. excision     Past Surgical History  Past Surgical History:  Procedure Laterality Date  . ABDOMINAL HYSTERECTOMY    . BACK SURGERY     x4 back surgery to include rods & screws  . Last in May 2014  . BREAST LUMPECTOMY WITH NEEDLE LOCALIZATION AND AXILLARY SENTINEL LYMPH NODE BX  03/12/2012   Procedure: BREAST LUMPECTOMY WITH NEEDLE LOCALIZATION AND AXILLARY SENTINEL LYMPH NODE BX;  Surgeon: Merrie Roof, MD;  Location: [place];  Service: General;  Laterality: Left;  . BREAST SURGERY     lumpectomy x2/left   . CHOLECYSTECTOMY    . COLONOSCOPY N/A 11/20/2013   Dr.Rourk- diverticula was found in the left colon. o/w normal rectal, colonic and terminal ileal mucosa  . ESOPHAGOGASTRODUODENOSCOPY N/A 11/20/2013   Dr.Rourk- abnormal mucosa was found. diffuse snake skinning and friability of the gastric mucosa. patent pylorus. abnormal-appearing first, second and third portion of the duodenum. 62m gastric nodule at the GE junction. stomach bx= minimal chronic infammation, GE junction bx= polypoid fragments  of gastric cardia type mucosa w/mod chronic inflammation and foveolar hyperplasia  . EYE SURGERY     cataract surgery bilat   . FRACTURE SURGERY     1975- [facility]- L ankle   . GIVENS CAPSULE STUDY N/A 02/24/2014   Procedure: GIVENS CAPSULE STUDY;  Surgeon: Daneil Dolin, MD;  [facility];  Service: Endoscopy;  Laterality: N/A;  . RE-EXCISION OF BREAST LUMPECTOMY  03/22/2012   Procedure: RE-EXCISION OF BREAST LUMPECTOMY;  Surgeon: Merrie Roof, MD;  Location: [place];  Service: General;  Laterality: Left;  re-excision left breast lumpectomy cavity  ER+, PR+  . ROTATOR CUFF REPAIR Bilateral   . SHOULDER SURGERY    . SKIN BIOPSY    . TOTAL KNEE ARTHROPLASTY Right 12/18/2014   Procedure: RIGHT TOTAL KNEE ARTHROPLASTY;  Surgeon: Sydnee Cabal, MD;  Location: [facility];  Service:  Orthopedics;  Laterality: Right;  . TUBAL LIGATION     Family History  Family History  Problem Relation Age of Onset  . Dementia Mother   . Heart disease Father   . Emphysema Father   . Cancer Brother        metastatic, unsure primary  . Stroke Sister   . CAD Brother   . Colon cancer Neg Hx   . Prostate cancer Neg Hx    Social History  reports that she has never smoked. She has never used smokeless tobacco. She reports that she does not drink alcohol or use drugs. Allergies  Allergies  Allergen Reactions  . Bee Venom Anaphylaxis and Swelling  . Hydromorphone Hcl Other (See Comments)    Agitation, Confusion  . Demerol Nausea Only   Home medications Prior to Admission medications   Medication Sig Start Date End Date Taking? Authorizing Provider  ALPHAGAN P 0.1 % SOLN Place 1 drop into both eyes 2 (two) times daily.  08/01/16  Yes [provider]  amLODipine (NORVASC) 5 MG tablet Take 5 mg by mouth every morning.    Yes [provider]  aspirin EC 81 MG tablet Take 81 mg by mouth daily.   Yes [provider]  atorvastatin (LIPITOR) 10 MG tablet Take 10 mg by mouth every evening.    Yes [provider]  Bacillus Coagulans-Inulin (PROBIOTIC FORMULA PO) Take 1 capsule by mouth daily.   Yes [provider]  capecitabine (XELODA) 500 MG tablet Take 3 tablets in AM, 2 tablets in PM, Take for 14 days, then hold for 7 days. Repeat every 21 days. Patient taking differently: Take 1,000-1,500 mg by mouth See admin instructions. Take 3 tablets in AM, 2 tablets in PM, Take for 14 days, then hold for 14 days days, then determine if they will repeat. 03/18/19  Yes Magrinat, Virgie Dad, MD  cyanocobalamin (,VITAMIN B-12,) 1000 MCG/ML injection Inject 1,000 mcg into the muscle every 30 (thirty) days.  05/14/17  Yes [provider]  dorzolamide (TRUSOPT) 2 % ophthalmic solution Place 1 drop into both eyes 2 (two) times daily.    Yes [provider]  EPIPEN 2-PAK 0.3 MG/0.3ML SOAJ injection Inject 0.3 mg into the muscle as needed for anaphylaxis.    Yes [provider]  FLOVENT HFA 110 MCG/ACT inhaler Inhale 1 puff into the lungs 2 (two) times daily.  02/10/19  Yes [provider]  gabapentin (NEURONTIN) 600 MG tablet Take 600 mg by mouth daily.    Yes [provider]  glipiZIDE (GLUCOTROL) 5 MG tablet Take  5 mg by mouth daily before breakfast.  07/31/16  Yes [provider]  ipratropium-albuterol (DUONEB) 0.5-2.5 (3) MG/3ML SOLN Take 3 mLs by nebulization 3 (three) times daily. 04/23/18  Yes Sinda Du, MD  latanoprost (XALATAN) 0.005 % ophthalmic solution Place 1 drop into both eyes at bedtime.  01/17/18  Yes [provider]  LORazepam (ATIVAN) 0.5 MG tablet Take 0.5 mg by mouth 2 (two) times daily.   Yes [provider]  magic mouthwash w/lidocaine SOLN Take 5 mLs by mouth 4 (four) times daily as needed for mouth pain.   Yes [provider]  metoprolol succinate (TOPROL-XL) 100 MG 24 hr tablet Take 100 mg by mouth every morning. Take with or immediately following a meal.   Yes [provider]  mirtazapine (REMERON) 15 MG tablet Take 15 mg by mouth at bedtime.  08/04/15  Yes [provider]  MYRBETRIQ 50 MG TB24 tablet Take 50 mg by mouth daily.  01/15/18  Yes [provider]  NOVOLOG FLEXPEN 100 UNIT/ML FlexPen Inject 0-18 Units into the skin See admin instructions. 0-18 units sliding scale breakfast, lunch, dinner 14 units at bedtime if sugar is over 400 03/26/19  Yes [provider]  Polyethyl Glycol-Propyl Glycol (SYSTANE ULTRA) 0.4-0.3 % SOLN Place 1-2 drops into both eyes daily as needed (for itching eyes).   Yes [provider]  torsemide (DEMADEX) 20 MG tablet Take 1 tablet (20 mg total) by mouth daily. 08/21/17  Yes Sinda Du, MD  traMADol (ULTRAM) 50 MG tablet Take 50 mg by mouth every 6 (six) hours as needed for  moderate pain.  03/13/19  Yes [provider]  TRESIBA FLEXTOUCH 100 UNIT/ML SOPN FlexTouch Pen Inject 0.5 mLs (50 Units total) into the skin daily. 04/23/18  Yes Sinda Du, MD  BD PEN NEEDLE NANO U/F 32G X 4 MM MISC  06/21/18   [provider]  cholestyramine (QUESTRAN) 4 g packet Take 1 packet (4 g total) by mouth 2 (two) times daily. 04/08/19   Gardenia Phlegm, NP  fluconazole (DIFLUCAN) 200 MG tablet Take 1 tablet (200 mg total) by mouth daily. 04/08/19   Gardenia Phlegm, NP  Lancets (ONETOUCH DELICA PLUS [id number]) [place]  06/18/18   [provider]  [facility] ULTRA test strip  08/23/18   [provider]  valACYclovir (VALTREX) 500 MG tablet Take 1 tablet (500 mg total) by mouth 2 (two) times daily. 04/08/19   Gardenia Phlegm, NP     Vitals:   04/13/19 1002 04/13/19 1200 04/13/19 1301 04/13/19 1400  BP: 106/74  (!) 105/56 (!) 130/53  Pulse: 89   77  Resp: _0 Temp: 98.5 F (36.9 C) (!) 97.4 F (36.3 C) (!) 97.4 F (36.3 C)   TempSrc: Oral Oral Oral   SpO2: 92%   93%  Weight:   100.2 kg   Height:   5' (1.524 m)    Exam Gen tired, arouses easily, not in distress, NG tube in No rash, cyanosis or gangrene Sclera anicteric, throat clear No jvd or bruits Chest bilat crackles at bases RRR no MRG Abd soft ntnd no mass or ascites +bs obese GU foley draining dark amber urine good amts MS no joint effusions or deformity Ext diffuse 2+ pitting edema hips/ lower leg > UE's, no wounds or ulcers Neuro is alert, Ox 3 , nf, deconditioned    Home meds:  - norvasc 10/ metoprolol xl 100 qam/ torsemide 20 qd  -  insulin novolog ac tid SSI 0- 18u/ glipizide 5 am  - mirabegron ER 50 qd/ tramadol prn/ mirtazapine 15/ lorazepam 0.5 bid prn/ gabapentin 600 qd  - capecitabine as directed  - atorvastatin 10 hs/ aspirin 81  - eyedrops  - duoneb tid/ flovent hfa  - prn's/ vitamins/ supplements   Total I/O here 2.1 L in and 875 cc  out (600 diarrhea, 275 uop today) > net + 1.2 L.  BP's on admit were normal to low-normal, no shock.   CXR 2/10 >  IMPRESSION: No acute cardiopulmonary disease. Stable bronchiectasis involving the RIGHT LOWER LOBE. CT abd 2/10 > IMPRESSION: 1. Findings consistent with sigmoid diverticulitis. No abscess is noted. 2. Findings consistent with hepatic cirrhosis with associated portal hypertension and mild splenomegaly. 3. Left renal atrophy is noted. No hydronephrosis or renal obstruction is noted. 4. Aortic atherosclerosis. Renal US > IMPRESSION:  Echogenic kidneys, L kidney atrophic 9gm, R kidney 10.4cm, no hydro       B/L CKD IV creat 1.9- 2.4 in march- dec 2020, eGFR 17- 23   UA 2/10 > negative, 5.0   IV abx here - cipro, zosyn , flagyl   No nsaids/ ARB/ contrast/ ACEi here   No shock, BP's all > 100 syst    CXR 2/14 - atx, no edema  Assessment/ Plan: 1. AoCKD IV - b/l creat 1.9- 2.4 in march- dec 2020, eGFR 17- 23. Renal function not far off from baseline really. Pt admitted for N/V, diarrhea due to chemoRx , possibly other GI causes. In hosp x 4-5 days, now has NG tube in for distended stomach.  Has been getting IVF's but I/O's are not complete. Up 20 lbs since admission. No shock, no nephrotoxins. UA neg, US shows chronic changes.  Is getting IV abx. Pt is sig vol overloaded on exam, but not real symptomatic. CXR clear.  - main recommendation is to stop IVF's, pt is not vol depleted - will start her on some IV lasix at cautious dosing to help get vol down - not a good HD candidate, have d/w family - pt is DNR, pt has mult sig organ problems, would consider comfort care discussions?  2. Vol overload - as above 3. N/V, diarrhea - has NG in now 4. Breast Ca - on xeloda 5. DM 2 - on insulin 6. Hyponatremia - hypervolemic 7. Cirrhosis w/ portal HTN 8. Morbid obesity   Rob Elzina Devera  MD 04/13/2019, 3:50 PM  Recent Labs  Lab 04/12/19 0439 04/13/19 0536  WBC 10.3 11.1*  HGB 13.3 14.4    Recent Labs  Lab 04/08/19 1420 04/08/19 1420 04/10/2019 1035 04/05/2019 1035 04/10/19 0454 04/10/19 0454 04/11/19 0729 04/11/19 0729 04/12/19 0439 04/12/19 0439 04/13/19 0536 04/13/19 1252  K 3.3*   < > 3.5   < > 3.4*   < > 3.8   < > 4.0   < > 5.3* 4.4  BUN 56*   < > 62*   < > 55*   < > 55*   < > 53*   < > 64* 68*  CREATININE 2.49*   < > 2.51*   < > 2.15*   < > 2.28*   < > 2.21*   < > 2.64* 2.95*  CALCIUM 7.4*  --  7.7*  --  7.9*  --  7.8*  --  7.8*  --  8.3* 8.0*   < > = values in this interval not displayed.

## 2019-04-13 NOTE — Progress Notes (Signed)
PROGRESS NOTE    [name]  DJM:[id number] DOB: February 21, 1934 DOA: 04/11/2019 PCP: Celene Squibb, MD   Brief Narrative: 78 YOF with significant history of anemia/osteoarthritis, left breast cancer, cataracts, CKD IV, liver cirrhosis, T2DM on long-term insulin, GERD, hyperlipidemia, hypertension history of multiple falls in the past, nocturia, seasonal allergies, history of facial melanoma with surgical excision in 2012 comes to the ER with multiple episodes of nausea vomiting and diarrhea following her chemotherapy cycle this week without melena hematochezia dysuria but with low-grade fever since Saturday, decreased oral intake for several days. In the ED vitals are stable UA CBC and CMP shows metabolic acidosis/CKD chronic.  LFTs lipase normal COVID-19) [place] negative, underwent chest x-ray no acute finding, and CT abdomen without contrast showed sigmoid diverticulitis, liver cirrhosis.  Patient was placed on IV antibiotic and was admitted on liquid diet and seen by her oncology. C diff negative.  2/14- no more diarrhea, but now having nasuea and also vomited x2 overnight and needing [place] o2.  Subjective: Seen urgently. No more Diarrhea Overnight vomited x2 and has been nauseous with poor oral intake. this am needing o2 on 4l [place] and spo2 in mid 90s. Still nauseous. She denies chest pain,shortess of breath and c/o abd pain diffuse during vomiting. No urine output, bladder scan 37 ml. Pt not eating and c/o mouth burning. No fever overnight. BP 99 systolic this am.  Obtained stat cxr/flat abd plate/labs   Assessment & Plan:  Nausea vomiting diarrhea, secondary to Sigmoid diverticulitis, also contributed by chemotherapy: diarrhea has improved, now nauseous and vomiting, stat XRAy abd chest obtained 2/14- suspecting ileus.  c diff was neg. On iV Cipro Flagyl-> change to zosyn.Cont zofran and increase phenergan to 12.5 mg,change to NPO, cont IVF, check lactic acid, bnp. Consulted and  discussed w Dr Kae Heller from surgery- due to ongoing nausea/ some distended stomach- advised NGT decompression. Patient feels less nauseous and less distended after NG tube had 500 mL output right away. We will keep on aggressive IV hydration.   Acute hypoxic respiratory failure: Suspect due to abd distension ileus. denies chest pain/shortness of breath, checking bnp/CXR- on personal review of cxr- stable. She does not appear fluid overloaded on exam.cont supplemental o2. We will transfer to Step down to monitor closely.  Lactic acidosis at 2.9 with volume depletion likely the etiology. She is feeling better after NG tube insertion. We  Give her bolus NSS 1 litre and reasess, as bnp is up ain 900 (ECHO 04/15/2018 shows lvef of 55-60%). Continue on IV fluids with bicarb. Repeat lactic acid after iv bolus again. [name] Kitchen Hypokalemia resolved.Now hyperkalemia-K is on higher side at 5.3 will stop iv fluids w KCL. Monitor bmp with ckd.  AKI on CKD stage IV/metabolic acidosis,chronic baseline creatinine 2.1-2.5. bun/creat worsening. hco3 at 12 with normal anion gap 12.lactic acid is up. Will change to ivf w/ bicarb at 100 mL/h bolus WITH 1 LITRE over 2 hrs.Monitor UOP-insert foley as output is low.Consulted and discussed with nephrology Dr Jonnie Finner.Check Renal US.  DM type 2 on insulin, uncontrolled A1c 11.6 04/22/2019, uncontrolled hyperglycemia: Hold glipizide, cont lantus at lower dose for npo- at home on Tresiba 50 units daily.Continue sliding scale.  Hyperlipidemia:hold statin.  Mouth ulcer likely 2/2 chemo. Was placed on Valtrex/diflucan and magic mouthwash per onco.  Dehydration/hypovolemia: Due to #1.BUN  Still up. Baseline around 20s. Cont on ivf NS.[name] Kitchen  Hyponatremia- from dehydration/poor intake.Cont ivf.   HTN: BP on lower side in 99 .will hold home  amlodipine, metoprolol. keep NPO, add prn iv meds.  Give her a liter bolus and reassess.  GERD: Continue on PPI  Recurrent breast adenocarcinoma, left:  Cepacetabine on hold,oncology has been consulted and will follow up as outpatient.  Cirrhosis with portal hypertension: Appears compensated.  Bilirubin slightly abnormal.  Monitor.  Morbid obesity with Body mass index is 43.14 kg/m.  With hypoalbuminemia augment nutrition once able to  Rashes:recently seen by dermatology.  Transfer to Step down to monitor closely.  Addendum X-ray films reviewed  And again patient was reassessed after NG tube insertion and she was feeling much better, was having brownish dark NG output  In canister Addendum: Seen in Stepdown unit- foley with abt 1000 ml urine output, resting and appears comfortable. bp in 105 and lactic acid went up- will order 100 liter bolus again. Also obtain CT abdomen/pelvis to evaluate her diveriticulitis. Repeat labs with worsening bun/creat m k improved., bicarb up at 7. spoke w RN iv access is not good- ordered PICC line.  DVT prophylaxis: SCD/ Heparin. Communication: plan of care discussed with patient at bedside.I called and updated the patient's daughter over the phone, she understands patient is seriously ill and being moved to SDU. Code Status: she is DNR, Consult Palliative care. Disposition Plan: Patient is from: Home Anticipated Disposition: TBD Barriers to discharge or conditions that needs to be met prior to discharge: once nausea vomitting resolves and able to tolerate diet  Consultants: Oncology, surgery, Nephro. Procedures:see note  ECHO 2/172020 The left ventricle has normal systolic function, with an ejection  fraction of 55-60%. The cavity size was normal. There is moderately  increased left ventricular wall thickness. Left ventricular diastolic  parameters were normal  Microbiology:see note   Medications: Scheduled Meds: . aspirin EC  81 mg Oral Daily  . brimonidine  1 drop Both Eyes BID  . budesonide  2 mL Inhalation BID  . Chlorhexidine Gluconate Cloth  6 each Topical Daily  . dorzolamide  1  drop Both Eyes BID  . fluconazole  200 mg Oral Daily  . heparin  5,000 Units Subcutaneous Q8H  . insulin aspart  0-15 Units Subcutaneous Q6H  . insulin glargine  15 Units Subcutaneous Daily  . ipratropium-albuterol  3 mL Nebulization TID  . latanoprost  1 drop Both Eyes QHS  . magic mouthwash w/lidocaine  5 mL Oral QID  . mouth rinse  15 mL Mouth Rinse BID  . mirabegron ER  50 mg Oral Daily  . mirtazapine  15 mg Oral QHS  . valACYclovir  500 mg Oral BID   Continuous Infusions: . piperacillin-tazobactam (ZOSYN)  IV 2.25 g (04/13/19 1123)  .  sodium bicarbonate (isotonic) infusion in sterile water 100 mL/hr at 04/13/19 1308  . sodium chloride      Antimicrobials: Anti-infectives (From admission, onward)   Start     Dose/Rate Route Frequency Ordered Stop   04/13/19 1200  piperacillin-tazobactam (ZOSYN) IVPB 2.25 g     2.25 g 100 mL/hr over 30 Minutes Intravenous Every 6 hours 04/13/19 0951     04/10/19 1200  ciprofloxacin (CIPRO) IVPB 400 mg  Status:  Discontinued     400 mg 200 mL/hr over 60 Minutes Intravenous Every 24 hours 04/13/2019 1530 04/13/19 0931   04/10/19 0000  ciprofloxacin (CIPRO) IVPB 400 mg  Status:  Discontinued     400 mg 200 mL/hr over 60 Minutes Intravenous Every 12 hours 04/17/2019 1523 04/22/2019 1530   04/23/2019 2200  metroNIDAZOLE (FLAGYL) IVPB 500 mg  Status:  Discontinued     500 mg 100 mL/hr over 60 Minutes Intravenous Every 8 hours 04/06/2019 1523 04/13/19 0931   04/21/2019 2200  valACYclovir (VALTREX) tablet 500 mg     500 mg Oral 2 times daily 04/02/2019 1740     04/19/2019 1830  fluconazole (DIFLUCAN) tablet 200 mg     200 mg Oral Daily 04/10/2019 1740     04/08/2019 1115  ciprofloxacin (CIPRO) IVPB 400 mg     400 mg 200 mL/hr over 60 Minutes Intravenous  Once 04/03/2019 1101 04/06/2019 1339   04/08/2019 1115  metroNIDAZOLE (FLAGYL) IVPB 500 mg     500 mg 100 mL/hr over 60 Minutes Intravenous  Once 04/22/2019 1101 04/13/2019 1339     Objective: Vitals:   04/13/19 0824  04/13/19 1002 04/13/19 1200 04/13/19 1301  BP:  106/74  (!) 105/56  Pulse:  89    Resp:  16  18  Temp:  98.5 F (36.9 C) (!) 97.4 F (36.3 C) (!) 97.4 F (36.3 C)  TempSrc:  Oral Oral Oral  SpO2: 93% 92%    Weight: 100.5 kg   100.2 kg  Height:    5' (1.524 m)    Intake/Output Summary (Last 24 hours) at 04/13/2019 1439 Last data filed at 04/13/2019 1123 Gross per 24 hour  Intake --  Output 875 ml  Net -875 ml   Filed Weights   04/23/2019 1809 04/13/19 0824 04/13/19 1301  Weight: 92.3 kg 100.5 kg 100.2 kg   Weight change:   Body mass index is 43.14 kg/m.  Intake/Output from previous day: 02/13 0701 - 02/14 0700 In: 440 [P.O.:440] Out: -  Intake/Output this shift: Total I/O In: -  Out: 875 [Urine:275; Emesis/NG output:600]  Examination: General exam: AAO at baseline, in mild to mod distress , NAD, weak appearing. HEENT:Oral mucosa dry/chapped, Ear/Nose WNL grossly, dentition normal. Respiratory system: bilaterally diminished, but no wheezing or crackles,no use of accessory muscle Cardiovascular system: S1 & S2 +, No JVD,. Gastrointestinal system: Abdomen soft, obese/distended, mildly tender left side, BS sluggish Nervous System:Alert, awake, moving extremities and grossly nonfocal Extremities: No edema, distal peripheral pulses palpable.  Skin: No rashes,no icterus. MSK: Normal muscle bulk,tone, power Skin rash  Data Reviewed: I have personally reviewed following labs and imaging studies  CBC: Recent Labs  Lab 04/08/19 1420 04/08/19 1420 04/19/2019 1035 04/10/19 0454 04/11/19 0729 04/12/19 0439 04/13/19 0536  WBC 8.3   < > 10.3 10.4 11.1* 10.3 11.1*  NEUTROABS 6.3  --  8.1*  --   --   --   --   HGB 11.1*   < > 13.2 13.3 13.0 13.3 14.4  HCT 32.4*   < > 38.0 39.4 37.2 39.3 41.8  MCV 109.1*   < > 109.5* 111.3* 110.4* 112.0* 109.4*  PLT 93*   < > 110* 106* 128* 141* 160   < > = values in this interval not displayed.   Basic Metabolic Panel: Recent Labs  Lab  04/10/19 0454 04/11/19 0729 04/12/19 0439 04/13/19 0536 04/13/19 1252  NA 135 135 130* 134* 132*  K 3.4* 3.8 4.0 5.3* 4.4  CL 105 109 106 110 107  CO2 17* 17* 15* 12* 14*  GLUCOSE 260* 189* 275* 213* 214*  BUN 55* 55* 53* 64* 68*  CREATININE 2.15* 2.28* 2.21* 2.64* 2.95*  CALCIUM 7.9* 7.8* 7.8* 8.3* 8.0*   GFR: Estimated Creatinine Clearance: 15.4 mL/min (A) (by C-G formula based on SCr of 2.95 mg/dL (H)). Liver Function  Tests: Recent Labs  Lab 04/08/19 1420 04/14/2019 1035 04/10/19 0454 04/12/19 0439 04/13/19 0536  AST 14* '23 19 18 26  '$ ALT '8 12 11 9 11  '$ ALKPHOS 57 63 54 58 59  BILITOT 0.9 1.3* 1.5* 1.3* 1.6*  PROT 4.4* 5.2* 4.8* 4.1* 4.0*  ALBUMIN 2.3* 2.6* 2.4* 2.1* 2.0*   Recent Labs  Lab 04/07/2019 1035  LIPASE 27   No results for input(s): AMMONIA in the last 168 hours. Coagulation Profile: Recent Labs  Lab 04/10/19 0454  INR 1.7*   Cardiac Enzymes: No results for input(s): CKTOTAL, CKMB, CKMBINDEX, TROPONINI in the last 168 hours. BNP (last 3 results) No results for input(s): PROBNP in the last 8760 hours. HbA1C: No results for input(s): HGBA1C in the last 72 hours. CBG: Recent Labs  Lab 04/12/19 1215 04/12/19 1707 04/12/19 2017 04/13/19 0736 04/13/19 1135  GLUCAP 233* 242* 242* 206* 197*   Lipid Profile: No results for input(s): CHOL, HDL, LDLCALC, TRIG, CHOLHDL, LDLDIRECT in the last 72 hours. Thyroid Function Tests: No results for input(s): TSH, T4TOTAL, FREET4, T3FREE, THYROIDAB in the last 72 hours. Anemia Panel: No results for input(s): VITAMINB12, FOLATE, FERRITIN, TIBC, IRON, RETICCTPCT in the last 72 hours. Sepsis Labs: Recent Labs  Lab 04/13/19 0388 04/13/19 1252  LATICACIDVEN 2.9* 3.8*    Recent Results (from the past 240 hour(s))  Respiratory Panel by RT PCR (Flu A&B, Covid) - Nasopharyngeal Swab     Status: None   Collection Time: 04/15/2019 11:02 AM   Specimen: Nasopharyngeal Swab  Result Value Ref Range Status   SARS  Coronavirus 2 by RT PCR NEGATIVE NEGATIVE Final    Comment: (NOTE) SARS-CoV-2 target nucleic acids are NOT DETECTED. The SARS-CoV-2 RNA is generally detectable in upper respiratoy specimens during the acute phase of infection. The lowest concentration of SARS-CoV-2 viral copies this assay can detect is 131 copies/mL. A negative result does not preclude SARS-Cov-2 infection and should not be used as the sole basis for treatment or other patient management decisions. A negative result may occur with  improper specimen collection/handling, submission of specimen other than nasopharyngeal swab, presence of viral mutation(s) within the areas targeted by this assay, and inadequate number of viral copies (<131 copies/mL). A negative result must be combined with clinical observations, patient history, and epidemiological information. The expected result is Negative. Fact Sheet for Patients:  PinkCheek.be Fact Sheet for Healthcare Providers:  GravelBags.it This test is not yet ap proved or cleared by the [place] FDA and  has been authorized for detection and/or diagnosis of SARS-CoV-2 by FDA under an Emergency Use Authorization (EUA). This EUA will remain  in effect (meaning this test can be used) for the duration of the COVID-19 declaration under Section 564(b)(1) of the Act, 21 U.S.C. section 360bbb-3(b)(1), unless the authorization is terminated or revoked sooner.    Influenza A by PCR NEGATIVE NEGATIVE Final   Influenza B by PCR NEGATIVE NEGATIVE Final    Comment: (NOTE) The Xpert Xpress SARS-CoV-2/FLU/RSV assay is intended as an aid in  the diagnosis of influenza from Nasopharyngeal swab specimens and  should not be used as a sole basis for treatment. Nasal washings and  aspirates are unacceptable for Xpert Xpress SARS-CoV-2/FLU/RSV  testing. Fact Sheet for Patients: PinkCheek.be Fact Sheet  for Healthcare Providers: GravelBags.it This test is not yet approved or cleared by the [place] FDA and  has been authorized for detection and/or diagnosis of SARS-CoV-2 by  FDA under an Emergency Use Authorization (EUA). This  EUA will remain  in effect (meaning this test can be used) for the duration of the  Covid-19 declaration under Section 564(b)(1) of the Act, 21  U.S.C. section 360bbb-3(b)(1), unless the authorization is  terminated or revoked. Performed at [facility], [place] [address] [place], [place] [address]   MRSA PCR Screening     Status: None   Collection Time: 04/17/2019  6:38 PM   Specimen: Nasopharyngeal  Result Value Ref Range Status   MRSA by PCR NEGATIVE NEGATIVE Final    Comment:        The GeneXpert MRSA Assay (FDA approved for NASAL specimens only), is one component of a comprehensive MRSA colonization surveillance program. It is not intended to diagnose MRSA infection nor to guide or monitor treatment for MRSA infections. Performed at [facility], [place] [address] [place], [place] [address]   C difficile quick scan w PCR reflex     Status: None   Collection Time: 04/10/19  6:06 PM   Specimen: STOOL  Result Value Ref Range Status   C Diff antigen NEGATIVE NEGATIVE Final   C Diff toxin NEGATIVE NEGATIVE Final   C Diff interpretation No C. difficile detected.  Final    Comment: Performed at [facility], [place] [address] [place], [place] [address]      Radiology Studies: DG Abd 1 View  Result Date: 04/13/2019 CLINICAL DATA:  Nausea and vomiting for 2 days EXAM: ABDOMEN - 1 VIEW COMPARISON:  04/26/2019 FINDINGS: Scattered gas pattern is noted. The stomach is distended. A paucity of distal colonic gas is seen which may be related to the underlying diverticulitis. No free air is seen. Postsurgical and degenerative changes of lumbar spine are noted. IMPRESSION: No acute  abnormality seen. Electronically Signed   By: Inez Catalina M.D.   On: 04/13/2019 09:04   DG CHEST PORT 1 VIEW  Result Date: 04/13/2019 CLINICAL DATA:  Shortness of breath EXAM: PORTABLE CHEST 1 VIEW COMPARISON:  04/05/2019 FINDINGS: Cardiac shadow is stable. Aortic calcifications are again seen. The lungs are well aerated bilaterally. No focal infiltrate or sizable effusion is seen. Minimal atelectatic changes are noted in the left base. IMPRESSION: Mild left basilar atelectasis.  No acute abnormality noted. Electronically Signed   By: Inez Catalina M.D.   On: 04/13/2019 09:00   DG Abd Portable 1V  Result Date: 04/13/2019 CLINICAL DATA:  Check gastric catheter placement EXAM: PORTABLE ABDOMEN - 1 VIEW COMPARISON:  04/13/2019 FINDINGS: Gastric catheter is now been placed in the distal stomach. The stomach is decompressed. There remains some gaseous dilation of the transverse colon. No other focal abnormality is noted. IMPRESSION: Gastric catheter within the stomach. Electronically Signed   By: Inez Catalina M.D.   On: 04/13/2019 09:50   [place] EKG SITE RITE  Result Date: 04/13/2019 If Site Rite image not attached, placement could not be confirmed due to current cardiac rhythm.    LOS: 3 days   Time spent: More than 50% of that time was spent in counseling and/or coordination of care.  Antonieta Pert, MD [facility]  04/13/2019, 2:39 PM

## 2019-04-13 NOTE — Progress Notes (Signed)
Peripherally Inserted Central Catheter/Midline Placement  The IV Nurse has discussed with the patient and/or persons authorized to consent for the patient, the purpose of this procedure and the potential benefits and risks involved with this procedure.  The benefits include less needle sticks, lab draws from the catheter, and the patient may be discharged home with the catheter. Risks include, but not limited to, infection, bleeding, blood clot (thrombus formation), and puncture of an artery; nerve damage and irregular heartbeat and possibility to perform a PICC exchange if needed/ordered by physician.  Alternatives to this procedure were also discussed.  Bard Power PICC patient education guide, fact sheet on infection prevention and patient information card has been provided to patient /or left at bedside.    PICC/Midline Placement Documentation  PICC Double Lumen 93/23/55 PICC Right Basilic 38 cm 0 cm (Active)  Indication for Insertion or Continuance of Line Vasoactive infusions 04/13/19 1802  Exposed Catheter (cm) 0 cm 04/13/19 1802  Site Assessment Clean;Dry;Intact 04/13/19 1802  Lumen #1 Status Flushed;Saline locked;Blood return noted 04/13/19 1802  Lumen #2 Status Flushed;Saline locked;Blood return noted 04/13/19 1802  Dressing Type Transparent 04/13/19 1802  Dressing Status Clean;Dry;Intact;Antimicrobial disc in place 04/13/19 1802  Dressing Change Due 04/20/19 04/13/19 1802       Gordan Payment 04/13/2019, 6:03 PM

## 2019-04-13 NOTE — Progress Notes (Signed)
MD called concerning patients oxygen saturation, blood pressure and to inform him that there was no urine out put over night. New orders given and will continue to monitor.

## 2019-04-13 NOTE — Consult Note (Signed)
Surgical Consultation Requesting provider: Dr. Maren Beach  Chief Complaint: vomiting  HPI: 86 year old woman with multiple significant medical problems including recurrent left breast cancer to the skin and chest wall currently on chemotherapy (capecitabine), cirrhosis with portal hypertension and splenomegaly, type 2 insulin-dependent diabetes, stage IV chronic kidney disease, history of multiple falls who presented to the emergency room 5 days ago with nausea, vomiting and diarrhea which had been preceded by low-grade fevers for a few days as well as several days of decreased oral intake.  CT scan at that time demonstrated uncomplicated sigmoid diverticulitis.  She has been admitted to the hospital since February 10 on IV antibiotics and liquid diet and IV fluid resuscitation.  Has mucositis per most recent oncology note.  She has been having loose bowel movements during hospitalization and has persistent nausea and vomiting.  Not really complaining of abdominal pain.  Repeat films this morning demonstrated a dilated stomach and scattered gas pattern with paucity of distal colonic gas.  Surgery was consulted for further management of diverticulitis and possible partial bowel obstruction.  Allergies  Allergen Reactions  . Bee Venom Anaphylaxis and Swelling  . Hydromorphone Hcl Other (See Comments)    Agitation, Confusion  . Demerol Nausea Only    Past Medical History:  Diagnosis Date  . Abrasion of arm, left    secondary to fall   . Anemia   . Arthritis   . Breast cancer, left ([place])    er/pr +  . Cataract    no surgery as yet,starting of 3 years  . Chronic kidney disease    renal stones, one episode of Lithotripsy  . Cirrhosis (H[facility])    Metavir score 4  . Complication of anesthesia   . Diabetes mellitus   . GERD (gastroesophageal reflux disease)   . Hyperlipidemia   . Hypertension    followed by [facility]. relative to  urology study grp.  Doesn't report ever having a stress test   .  Mental disorder   . Multiple falls   . Nocturia   . PONV (postoperative nausea and vomiting)   . Seasonal allergies   . Skin cancer    face- melanoma, 2012- surg. excision      Past Surgical History:  Procedure Laterality Date  . ABDOMINAL HYSTERECTOMY    . BACK SURGERY     x4 back surgery to include rods & screws  . Last in May 2014  . BREAST LUMPECTOMY WITH NEEDLE LOCALIZATION AND AXILLARY SENTINEL LYMPH NODE BX  03/12/2012   Procedure: BREAST LUMPECTOMY WITH NEEDLE LOCALIZATION AND AXILLARY SENTINEL LYMPH NODE BX;  Surgeon: Merrie Roof, MD;  Location: [facility];  Service: General;  Laterality: Left;  . BREAST SURGERY     lumpectomy x2/left   . CHOLECYSTECTOMY    . COLONOSCOPY N/A 11/20/2013   Dr.Rourk- diverticula was found in the left colon. o/w normal rectal, colonic and terminal ileal mucosa  . ESOPHAGOGASTRODUODENOSCOPY N/A 11/20/2013   Dr.Rourk- abnormal mucosa was found. diffuse snake skinning and friability of the gastric mucosa. patent pylorus. abnormal-appearing first, second and third portion of the duodenum. 37mm gastric nodule at the GE junction. stomach bx= minimal chronic infammation, GE junction bx= polypoid fragments of gastric cardia type mucosa w/mod chronic inflammation and foveolar hyperplasia  . EYE SURGERY     cataract surgery bilat   . FRACTURE SURGERY     1975- ORIF- L ankle   . GIVENS CAPSULE STUDY N/A 02/24/2014   Procedure: GIVENS CAPSULE STUDY;  Surgeon: Daneil Dolin, MD;  [facility];  Service: Endoscopy;  Laterality: N/A;  . RE-EXCISION OF BREAST LUMPECTOMY  03/22/2012   Procedure: RE-EXCISION OF BREAST LUMPECTOMY;  Surgeon: Merrie Roof, MD;  Location: [facility];  Service: General;  Laterality: Left;  re-excision left breast lumpectomy cavity  ER+, PR+  . ROTATOR CUFF REPAIR Bilateral   . SHOULDER SURGERY    . SKIN BIOPSY    . TOTAL KNEE ARTHROPLASTY Right 12/18/2014   Procedure: RIGHT TOTAL KNEE ARTHROPLASTY;  Surgeon:  Sydnee Cabal, MD;  Location: [facility];  Service: Orthopedics;  Laterality: Right;  . TUBAL LIGATION      Family History  Problem Relation Age of Onset  . Dementia Mother   . Heart disease Father   . Emphysema Father   . Cancer Brother        metastatic, unsure primary  . Stroke Sister   . CAD Brother   . Colon cancer Neg Hx   . Prostate cancer Neg Hx     Social History   Socioeconomic History  . Marital status: Widowed    Spouse name: Not on file  . Number of children: Not on file  . Years of education: Not on file  . Highest education level: Not on file  Occupational History  . 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 Smoking status: Never Smoker  . Smokeless tobacco: Never Used  Substance and Sexual Activity  . Alcohol use: No  . Drug use: No  . Sexual activity: Not Currently    Comment: GX p2,  1st live birth 64  Other Topics Concern  . Not on file  Social History Narrative  . Not on file   Social Determinants of Health   Financial Resource Strain:   . Difficulty of Paying Living Expenses: Not on file  Food Insecurity:   . Worried About Charity fundraiser in the Last Year: Not on file  . Ran Out of Food in the Last Year: Not on file  Transportation Needs:   . Lack of Transportation (Medical): Not on file  . Lack of Transportation (Non-Medical): Not on file  Physical Activity:   . Days of Exercise per Week: Not on file  . Minutes of Exercise per Session: Not on file  Stress:   . Feeling of Stress : Not on file  Social Connections:   . Frequency of Communication with Friends and Family: Not on file  . Frequency of Social Gatherings with Friends and Family: Not on file  . Attends Religious Services: Not on file  . Active Member of Clubs or Organizations: Not on file  . Attends Archivist Meetings: Not on file  . Marital Status: Not on file    No current facility-administered  medications on file prior to encounter.   Current Outpatient Medications on File Prior to Encounter  Medication Sig Dispense Refill  . ALPHAGAN P 0.1 % SOLN Place 1 drop into both eyes 2 (two) times daily.     [name] Kitchen amLODipine (NORVASC) 5 MG tablet Take 5 mg by mouth every morning.     [name] Kitchen aspirin EC 81 MG tablet Take 81 mg by mouth daily.    [name] Kitchen atorvastatin (LIPITOR) 10 MG tablet Take 10 mg by mouth every evening.     . Bacillus Coagulans-Inulin (PROBIOTIC FORMULA PO) Take 1 capsule by mouth daily.    [name] Kitchen  capecitabine (XELODA) 500 MG tablet Take 3 tablets in AM, 2 tablets in PM, Take for 14 days, then hold for 7 days. Repeat every 21 days. (Patient taking differently: Take 1,000-1,500 mg by mouth See admin instructions. Take 3 tablets in AM, 2 tablets in PM, Take for 14 days, then hold for 14 days days, then determine if they will repeat.) 70 tablet 6  . cyanocobalamin (,VITAMIN B-12,) 1000 MCG/ML injection Inject 1,000 mcg into the muscle every 30 (thirty) days.     . dorzolamide (TRUSOPT) 2 % ophthalmic solution Place 1 drop into both eyes 2 (two) times daily.   4  . EPIPEN 2-PAK 0.3 MG/0.3ML SOAJ injection Inject 0.3 mg into the muscle as needed for anaphylaxis.     [name] Kitchen FLOVENT HFA 110 MCG/ACT inhaler Inhale 1 puff into the lungs 2 (two) times daily.     [name] Kitchen gabapentin (NEURONTIN) 600 MG tablet Take 600 mg by mouth daily.     [name] Kitchen glipiZIDE (GLUCOTROL) 5 MG tablet Take 5 mg by mouth daily before breakfast.     . ipratropium-albuterol (DUONEB) 0.5-2.5 (3) MG/3ML SOLN Take 3 mLs by nebulization 3 (three) times daily. 360 mL 3  . latanoprost (XALATAN) 0.005 % ophthalmic solution Place 1 drop into both eyes at bedtime.     [name] Kitchen LORazepam (ATIVAN) 0.5 MG tablet Take 0.5 mg by mouth 2 (two) times daily.    . magic mouthwash w/lidocaine SOLN Take 5 mLs by mouth 4 (four) times daily as needed for mouth pain.    . metoprolol succinate (TOPROL-XL) 100 MG 24 hr tablet Take 100 mg by mouth every morning. Take with or  immediately following a meal.    . mirtazapine (REMERON) 15 MG tablet Take 15 mg by mouth at bedtime.     [name] Kitchen MYRBETRIQ 50 MG TB24 tablet Take 50 mg by mouth daily.     [name] Kitchen NOVOLOG FLEXPEN 100 UNIT/ML FlexPen Inject 0-18 Units into the skin See admin instructions. 0-18 units sliding scale breakfast, lunch, dinner 14 units at bedtime if sugar is over 400    . Polyethyl Glycol-Propyl Glycol (SYSTANE ULTRA) 0.4-0.3 % SOLN Place 1-2 drops into both eyes daily as needed (for itching eyes).    . torsemide (DEMADEX) 20 MG tablet Take 1 tablet (20 mg total) by mouth daily. 30 tablet 5  . traMADol (ULTRAM) 50 MG tablet Take 50 mg by mouth every 6 (six) hours as needed for moderate pain.     . TRESIBA FLEXTOUCH 100 UNIT/ML SOPN FlexTouch Pen Inject 0.5 mLs (50 Units total) into the skin daily. 5 pen 5  . BD PEN NEEDLE NANO U/F 32G X 4 MM MISC     . cholestyramine (QUESTRAN) 4 g packet Take 1 packet (4 g total) by mouth 2 (two) times daily. 60 each 12  . fluconazole (DIFLUCAN) 200 MG tablet Take 1 tablet (200 mg total) by mouth daily. 7 tablet 3  . Lancets (ONETOUCH DELICA PLUS [id number]) MISC     . ONETOUCH ULTRA test strip     . valACYclovir (VALTREX) 500 MG tablet Take 1 tablet (500 mg total) by mouth 2 (two) times daily. 60 tablet 2    Review of Systems: a complete, 10pt review of systems was completed with pertinent positives and negatives as documented in the HPI  Physical Exam: Vitals:   04/13/19 0744 04/13/19 0824  BP: 99/66   Pulse: 88   Resp:    Temp:    SpO2: 90% 93%   Gen:  A&Ox3, no distress  Eyes: lids and conjunctivae normal, no icterus. Pupils equally round and reactive to light.  Respiratory: supple without mass or thyromegaly Chest: respiratory effort is normal. No crepitus or tenderness on palpation of the chest. Breath sounds equal.  Cardiovascular: RRR with palpable distal pulses, no pedal edema  Gastrointestinal: soft, obese, mildly diffusely tender with increased focal  tenderness in the left lower quadrant, no peritonitis. No mass, hepatomegaly or splenomegaly. No hernia.  Well-healed right subcostal scar. Lymphatic: no palpable lymphadenopathy in the neck or groin Muscoloskeletal: no clubbing or cyanosis of the fingers.  Strength is symmetrical throughout.  Range of motion of bilateral upper and lower extremities normal with pain, crepitation or contracture. Neuro: cranial nerves grossly intact.  Sensation intact to light touch diffusely. Psych: appropriate mood and affect, normal insight/judgment intact  Skin: warm and dry   CBC Latest Ref Rng & Units 04/13/2019 04/12/2019 04/11/2019  WBC 4.0 - 10.5 K/uL 11.1(H) 10.3 11.1(H)  Hemoglobin 12.0 - 15.0 g/dL 14.4 13.3 13.0  Hematocrit 36.0 - 46.0 % 41.8 39.3 37.2  Platelets 150 - 400 K/uL 160 141(L) 128(L)    CMP Latest Ref Rng & Units 04/13/2019 04/12/2019 04/11/2019  Glucose 70 - 99 mg/dL 213(H) 275(H) 189(H)  BUN 8 - 23 mg/dL 64(H) 53(H) 55(H)  Creatinine 0.44 - 1.00 mg/dL 2.64(H) 2.21(H) 2.28(H)  Sodium 135 - 145 mmol/L 134(L) 130(L) 135  Potassium 3.5 - 5.1 mmol/L 5.3(H) 4.0 3.8  Chloride 98 - 111 mmol/L 110 106 109  CO2 22 - 32 mmol/L 12(L) 15(L) 17(L)  Calcium 8.9 - 10.3 mg/dL 8.3(L) 7.8(L) 7.8(L)  Total Protein 6.5 - 8.1 g/dL 4.0(L) 4.1(L) -  Total Bilirubin 0.3 - 1.2 mg/dL 1.6(H) 1.3(H) -  Alkaline Phos 38 - 126 U/L 59 58 -  AST 15 - 41 U/L 26 18 -  ALT 0 - 44 U/L 11 9 -    Lab Results  Component Value Date   INR 1.7 (H) 04/10/2019   INR 1.09 08/17/2017   INR 1.09 12/11/2014    Imaging: DG Abd 1 View  Result Date: 04/13/2019 CLINICAL DATA:  Nausea and vomiting for 2 days EXAM: ABDOMEN - 1 VIEW COMPARISON:  04/17/2019 FINDINGS: Scattered gas pattern is noted. The stomach is distended. A paucity of distal colonic gas is seen which may be related to the underlying diverticulitis. No free air is seen. Postsurgical and degenerative changes of lumbar spine are noted. IMPRESSION: No acute  abnormality seen. Electronically Signed   By: Inez Catalina M.D.   On: 04/13/2019 09:04   DG CHEST PORT 1 VIEW  Result Date: 04/13/2019 CLINICAL DATA:  Shortness of breath EXAM: PORTABLE CHEST 1 VIEW COMPARISON:  04/11/2019 FINDINGS: Cardiac shadow is stable. Aortic calcifications are again seen. The lungs are well aerated bilaterally. No focal infiltrate or sizable effusion is seen. Minimal atelectatic changes are noted in the left base. IMPRESSION: Mild left basilar atelectasis.  No acute abnormality noted. Electronically Signed   By: Inez Catalina M.D.   On: 04/13/2019 09:00     A/P: 86 year old woman with multiple significant medical problems, uncomplicated diverticulitis noted on CT scan 4 days ago, and persistent nausea/emesis concerning for obstruction.  NG tube has been placed with immediate 500 mL of dark blood-tinged output and some symptomatic relief.  Will escalate antibiotics to Zosyn.  Continue bowel rest, gentle resuscitation per primary team.  Consider repeat CT scan tomorrow (5 days after initial) to assess for evolution of diverticulitis, possible abscess etc.  Given her comorbidities and frailty she is a very poor surgical candidate and if surgery becomes indicated her risk of perioperative morbidity and mortality is very high.  Surgery will continue to follow.  Patient Active Problem List   Diagnosis Date Noted  . Diverticulitis of sigmoid colon 04/10/2019  . Sigmoid diverticulitis 04/15/2019  . Cirrhosis ([place]) 11/25/2018  . Community acquired pneumonia 10/27/2018  . Fever 10/26/2018  . Acute Respiratory failure with Hypoxia ([place]) 10/26/2018  . Acute hypoxemic respiratory failure ([place]) 04/15/2018  . Acute on chronic diastolic CHF (congestive heart failure) ([place]) 04/15/2018  . Malignant neoplasm of overlapping sites of left breast in female, estrogen receptor positive ([place]) 04/03/2018  . Recurrent breast adenocarcinoma, left ([place]) 04/03/2018  . Altered mental status 08/18/2017   . Acute encephalopathy 08/17/2017  . Acute Metabolic Encephalopathy acute 08/17/2017  . Encephalopathy 07/25/2017  . Diabetes mellitus ([place]) 09/02/2015  . Anemia due to multiple mechanisms 02/17/2015  . Chronic LBP 02/17/2015  . Impaired renal function 02/17/2015  . Abducens nerve weakness 02/17/2015  . S/P knee replacement 12/18/2014  . Osteoarthritis of right knee 12/18/2014  . IDA (iron deficiency anemia) 10/29/2013  . Vitamin B12 deficiency anemia 09/09/2013  . Anemia, iron deficiency 09/09/2013  . Lumbago 08/06/2013  . Difficulty in walking(719.7) 08/06/2013  . Osteopenia 03/11/2013  . Malignant neoplasm of lower-inner quadrant of left breast in female, estrogen receptor positive ([place]) 03/10/2013  . Bilateral leg weakness 09/23/2012  . Acute renal failure ([place]) 07/25/2012  . Dehydration 07/25/2012  . DM (diabetes mellitus), type 2, uncontrolled ([place]) 07/25/2012  . Essential hypertension, benign 07/25/2012  . Generalized weakness 07/25/2012  . Hyperlipidemia   . Chronic kidney disease   . Hypertension   . GERD (gastroesophageal reflux disease)   . CAP (community acquired pneumonia) 04/19/2011  . Nausea and vomiting 04/19/2011  . HTN (hypertension) 04/19/2011  . DM type 2 (diabetes mellitus, type 2) ([place]) 04/19/2011  . Arthritis 04/19/2011       Romana Juniper, MD [facility]  See AMION to contact appropriate on-call provider

## 2019-04-13 NOTE — Progress Notes (Signed)
Pharmacy Antibiotic Note  [name] is a 86 y.o. female admitted on 04/19/2019 with IAI.  Pharmacy has been consulted for zosyn dosing. To escalate cipro/flagyl to zosyn for IAI. AF, WBC 11.1, SCr 2.64, CrCL ~17 ml/min.   Plan: Zosyn 2.25 mg IV q6h for CrCl < 20 ml/min  Height: 5' (152.4 cm) Weight: 221 lb 9 oz (100.5 kg) IBW/kg (Calculated) : 45.5  Temp (24hrs), Avg:97.7 F (36.5 C), Min:97.3 F (36.3 C), Max:98.3 F (36.8 C)  Recent Labs  Lab 04/25/2019 1035 04/10/19 0454 04/11/19 0729 04/12/19 0439 04/13/19 0536  WBC 10.3 10.4 11.1* 10.3 11.1*  CREATININE 2.51* 2.15* 2.28* 2.21* 2.64*    Estimated Creatinine Clearance: 17.2 mL/min (A) (by C-G formula based on SCr of 2.64 mg/dL (H)).    Allergies  Allergen Reactions  . Bee Venom Anaphylaxis and Swelling  . Hydromorphone Hcl Other (See Comments)    Agitation, Confusion  . Demerol Nausea Only    Antimicrobials this admission: 2/10 Cipro>>2/14 2/10 Fluconazole>> 2/10 flagyl >>2/14 2/10 valacyclovir>> 2/14 Zosyn>> Dose adjustments this admission:  Microbiology results: 2/10 covid/flu neg 2/10 MRSA PCR neg 2/11 C diff neg  Thank you for allowing pharmacy to be a part of this patient's care.  Eudelia Bunch, Pharm.D [phone] 04/13/2019 9:54 AM

## 2019-04-14 DIAGNOSIS — L8990 Pressure ulcer of unspecified site, unspecified stage: Secondary | ICD-10-CM | POA: Insufficient documentation

## 2019-04-14 LAB — CBC
HCT: 41.8 % (ref 36.0–46.0)
Hemoglobin: 14.2 g/dL (ref 12.0–15.0)
MCH: 38.5 pg — ABNORMAL HIGH (ref 26.0–34.0)
MCHC: 34 g/dL (ref 30.0–36.0)
MCV: 113.3 fL — ABNORMAL HIGH (ref 80.0–100.0)
Platelets: 169 10*3/uL (ref 150–400)
RBC: 3.69 MIL/uL — ABNORMAL LOW (ref 3.87–5.11)
RDW: 18.6 % — ABNORMAL HIGH (ref 11.5–15.5)
WBC: 13.1 10*3/uL — ABNORMAL HIGH (ref 4.0–10.5)
nRBC: 3.4 % — ABNORMAL HIGH (ref 0.0–0.2)

## 2019-04-14 LAB — GLUCOSE, CAPILLARY
Glucose-Capillary: 129 mg/dL — ABNORMAL HIGH (ref 70–99)
Glucose-Capillary: 161 mg/dL — ABNORMAL HIGH (ref 70–99)
Glucose-Capillary: 132 mg/dL — ABNORMAL HIGH (ref 70–99)
Glucose-Capillary: 133 mg/dL — ABNORMAL HIGH (ref 70–99)
Glucose-Capillary: 150 mg/dL — ABNORMAL HIGH (ref 70–99)

## 2019-04-14 LAB — COMPREHENSIVE METABOLIC PANEL
ALT: 11 U/L (ref 0–44)
AST: 20 U/L (ref 15–41)
Albumin: 2 g/dL — ABNORMAL LOW (ref 3.5–5.0)
Alkaline Phosphatase: 59 U/L (ref 38–126)
Anion gap: 10 (ref 5–15)
BUN: 77 mg/dL — ABNORMAL HIGH (ref 8–23)
CO2: 18 mmol/L — ABNORMAL LOW (ref 22–32)
Calcium: 8.3 mg/dL — ABNORMAL LOW (ref 8.9–10.3)
Chloride: 110 mmol/L (ref 98–111)
Creatinine, Ser: 3.37 mg/dL — ABNORMAL HIGH (ref 0.44–1.00)
GFR calc Af Amer: 14 mL/min — ABNORMAL LOW (ref >60)
GFR calc non Af Amer: 12 mL/min — ABNORMAL LOW (ref >60)
Glucose, Bld: 139 mg/dL — ABNORMAL HIGH (ref 70–99)
Potassium: 4.3 mmol/L (ref 3.5–5.1)
Sodium: 138 mmol/L (ref 135–145)
Total Bilirubin: 1.5 mg/dL — ABNORMAL HIGH (ref 0.3–1.2)
Total Protein: 3.8 g/dL — ABNORMAL LOW (ref 6.5–8.1)

## 2019-04-14 LAB — MAGNESIUM: Magnesium: 2.1 mg/dL (ref 1.7–2.4)

## 2019-04-14 MED ORDER — INSULIN GLARGINE 100 UNIT/ML ~~LOC~~ SOLN
10.0000 [IU] | Freq: Every day | SUBCUTANEOUS | Status: DC
  Administered 2019-04-15 – 2019-04-16 (×2): 10 [IU] via SUBCUTANEOUS
  Filled 2019-04-14 (×2): qty 0.1

## 2019-04-14 MED ORDER — FLUCONAZOLE 100MG IVPB
100.0000 mg | INTRAVENOUS | Status: DC
  Administered 2019-04-14: 100 mg via INTRAVENOUS
  Filled 2019-04-14 (×2): qty 50

## 2019-04-14 MED ORDER — FUROSEMIDE 10 MG/ML IJ SOLN
60.0000 mg | Freq: Two times a day (BID) | INTRAMUSCULAR | Status: DC
  Administered 2019-04-14: 16:00:00 60 mg via INTRAVENOUS
  Filled 2019-04-14 (×2): qty 6

## 2019-04-14 MED ORDER — DEXTROSE 5 % IV SOLN
400.0000 mg | Freq: Two times a day (BID) | INTRAVENOUS | Status: DC
  Administered 2019-04-14 – 2019-04-15 (×2): 400 mg via INTRAVENOUS
  Filled 2019-04-14 (×3): qty 8

## 2019-04-14 MED ORDER — PANTOPRAZOLE SODIUM 40 MG IV SOLR
40.0000 mg | INTRAVENOUS | Status: DC
  Administered 2019-04-14 – 2019-04-16 (×3): 40 mg via INTRAVENOUS
  Filled 2019-04-14 (×3): qty 40

## 2019-04-14 MED ORDER — MENTHOL 3 MG MT LOZG
1.0000 | LOZENGE | OROMUCOSAL | Status: DC | PRN
  Filled 2019-04-14: qty 9

## 2019-04-14 NOTE — Progress Notes (Signed)
Sigmoid diverticulitis  Subjective: Pt denies abd pain  Objective: Vital signs in last 24 hours: Temp:  [97.4 F (36.3 C)-98.6 F (37 C)] 98.6 F (37 C) (02/15 0800) Pulse Rate:  [77-99] 92 (02/15 0545) Resp:  [15-20] 19 (02/15 0545) BP: (105-130)/(51-84) 119/57 (02/15 0541) SpO2:  [90 %-94 %] 92 % (02/15 0734) Weight:  [96.8 kg-100.2 kg] 96.8 kg (02/15 0500) Last BM Date: 04/11/19  Intake/Output from previous day: 02/14 0701 - 02/15 0700 In: 143.7 [IV Piggyback:143.7] Out: 2925 [Urine:1875; Emesis/NG output:1050] Intake/Output this shift: No intake/output data recorded.  General appearance: alert and cooperative GI: distended, nontener to palpaton NG with bilious output  Lab Results:  Results for orders placed or performed during the hospital encounter of 03/31/2019 (from the past 24 hour(s))  Glucose, capillary     Status: Abnormal   Collection Time: 04/13/19 11:35 AM  Result Value Ref Range   Glucose-Capillary 197 (H) 70 - 99 mg/dL   Comment 1 Notify RN    Comment 2 Document in Chart   MRSA PCR Screening     Status: None   Collection Time: 04/13/19 12:18 PM   Specimen: Nasal Mucosa; Nasopharyngeal  Result Value Ref Range   MRSA by PCR NEGATIVE NEGATIVE  Lactic acid, plasma     Status: Abnormal   Collection Time: 04/13/19 12:52 PM  Result Value Ref Range   Lactic Acid, Venous 3.8 (HH) 0.5 - 1.9 mmol/L  Basic metabolic panel     Status: Abnormal   Collection Time: 04/13/19 12:52 PM  Result Value Ref Range   Sodium 132 (L) 135 - 145 mmol/L   Potassium 4.4 3.5 - 5.1 mmol/L   Chloride 107 98 - 111 mmol/L   CO2 14 (L) 22 - 32 mmol/L   Glucose, Bld 214 (H) 70 - 99 mg/dL   BUN 68 (H) 8 - 23 mg/dL   Creatinine, Ser 2.95 (H) 0.44 - 1.00 mg/dL   Calcium 8.0 (L) 8.9 - 10.3 mg/dL   GFR calc non Af Amer 14 (L) >60 mL/min   GFR calc Af Amer 16 (L) >60 mL/min   Anion gap 11 5 - 15  Lactic acid, plasma     Status: Abnormal   Collection Time: 04/13/19  6:00 PM  Result  Value Ref Range   Lactic Acid, Venous 3.2 (HH) 0.5 - 1.9 mmol/L  Glucose, capillary     Status: Abnormal   Collection Time: 04/13/19  8:25 PM  Result Value Ref Range   Glucose-Capillary 184 (H) 70 - 99 mg/dL  Glucose, capillary     Status: Abnormal   Collection Time: 04/14/19 12:03 AM  Result Value Ref Range   Glucose-Capillary 161 (H) 70 - 99 mg/dL  Glucose, capillary     Status: Abnormal   Collection Time: 04/14/19  5:24 AM  Result Value Ref Range   Glucose-Capillary 129 (H) 70 - 99 mg/dL  Comprehensive metabolic panel     Status: Abnormal   Collection Time: 04/14/19  5:44 AM  Result Value Ref Range   Sodium 138 135 - 145 mmol/L   Potassium 4.3 3.5 - 5.1 mmol/L   Chloride 110 98 - 111 mmol/L   CO2 18 (L) 22 - 32 mmol/L   Glucose, Bld 139 (H) 70 - 99 mg/dL   BUN 77 (H) 8 - 23 mg/dL   Creatinine, Ser 3.37 (H) 0.44 - 1.00 mg/dL   Calcium 8.3 (L) 8.9 - 10.3 mg/dL   Total Protein 3.8 (L) 6.5 - 8.1 g/dL  Albumin 2.0 (L) 3.5 - 5.0 g/dL   AST 20 15 - 41 U/L   ALT 11 0 - 44 U/L   Alkaline Phosphatase 59 38 - 126 U/L   Total Bilirubin 1.5 (H) 0.3 - 1.2 mg/dL   GFR calc non Af Amer 12 (L) >60 mL/min   GFR calc Af Amer 14 (L) >60 mL/min   Anion gap 10 5 - 15  CBC     Status: Abnormal   Collection Time: 04/14/19  5:44 AM  Result Value Ref Range   WBC 13.1 (H) 4.0 - 10.5 K/uL   RBC 3.69 (L) 3.87 - 5.11 MIL/uL   Hemoglobin 14.2 12.0 - 15.0 g/dL   HCT 41.8 36.0 - 46.0 %   MCV 113.3 (H) 80.0 - 100.0 fL   MCH 38.5 (H) 26.0 - 34.0 pg   MCHC 34.0 30.0 - 36.0 g/dL   RDW 18.6 (H) 11.5 - 15.5 %   Platelets 169 150 - 400 K/uL   nRBC 3.4 (H) 0.0 - 0.2 %  Magnesium     Status: None   Collection Time: 04/14/19  5:44 AM  Result Value Ref Range   Magnesium 2.1 1.7 - 2.4 mg/dL     Studies/Results Radiology     MEDS, Scheduled . aspirin EC  81 mg Oral Daily  . brimonidine  1 drop Both Eyes BID  . budesonide  2 mL Inhalation BID  . Chlorhexidine Gluconate Cloth  6 each Topical  Daily  . dorzolamide  1 drop Both Eyes BID  . fluconazole  200 mg Oral Daily  . furosemide  60 mg Intravenous Q8H  . heparin  5,000 Units Subcutaneous Q8H  . insulin aspart  0-15 Units Subcutaneous Q6H  . insulin glargine  15 Units Subcutaneous Daily  . ipratropium-albuterol  3 mL Nebulization TID  . latanoprost  1 drop Both Eyes QHS  . magic mouthwash w/lidocaine  5 mL Oral QID  . mouth rinse  15 mL Mouth Rinse BID  . mirabegron ER  50 mg Oral Daily  . mirtazapine  15 mg Oral QHS  . sodium chloride flush  10-40 mL Intracatheter Q12H  . valACYclovir  500 mg Oral BID     Assessment: Sigmoid diverticulitis: improving No sign of bowel obstruction on CT from yesterday  Plan: Cont NG for presumed ileus.  Pt not a surgical candidate currently.  Will repeat AXR in AM  LOS: 4 days    Rosario Adie, [facility], [place]    04/14/2019 9:16 AM

## 2019-04-14 NOTE — TOC Initial Note (Addendum)
Transition of Care [facility]) - Initial/Assessment Note    Patient Details  Name: [name] MRN: [id number] Date of Birth: [birth year]  Transition of Care [facility]) CM/SW Contact:    Norina Buzzard, RN Phone Number: 04/14/2019, 9:52 AM  Clinical Narrative: 86 yo F admitted with N & V and diarrhea following her chemotherapy. Hx of anemia, OA, L breast CA, CKD IV, liver cirrhosis, T2DM on long-term insulin, GERD, hyperlipidemia, HTN, multiple falls in the past, nocturia, facial melanoma with surgical excision in 2012.   PT is recommending HH PT/OT and she may benefit from a W/C.  Contacted pt's daughter [name]) at [phone]. Daughter reports that her mother lives with her and her husband for the last year. She works from home and her husband is retired. They provide transportation and she denies any issues with this. Pt has a new PCP (Dr. Wende Neighbors). She denies any issues with pt's prescriptions.   Discussed DME. Pt has a walker, shower chair, and bed rails. Discussed PT recommendations. She agrees with [facility] services, but her mother has to be able to use her walker and clean herself if she returns home. Discussed preference for a [facility] agency. She reports that her mother has used [facility] multiple times and she prefers to use them again. Discussed DME. She reports that her mother needs a light W/C and a 3-in-1 BSC that she can use at night time. She stated that her mother can benefit from [facility] nursing.   Will continue to f/u to assist with the D/C plan.     Pt needs HH and DME orders if she is D/C home. Will contact [place] agencies for referrals once pt is medically stable to be D/C and we have orders.   Expected Discharge Plan: [place] Services Barriers to Discharge: Other (comment)   Patient Goals and CMS Choice     Choice offered to / list presented to : Adult Children  Expected Discharge Plan and Services Expected Discharge Plan: [place]   Discharge Planning Services: CM Consult Post Acute Care Choice: [place] arrangements for the past 2 months: [place]: [place] ([place])        Prior Living Arrangements/Services Living arrangements for the past 2 months: Single Family Home Lives with:: Adult Children                   Activities of Daily Living Home Assistive Devices/Equipment: Cane (specify quad or straight), CBG Meter, Eyeglasses, Grab bars around toilet, Grab bars in shower, Hand-held shower hose, Hearing aid, Nebulizer, Shower chair with back, Environmental consultant (specify type), Other (Comment)(4 wheeled walker, single point cane, bilateral hearing aids, tub/shower unit, handicapped toilet) ADL Screening (condition at time of admission) Patient's cognitive ability adequate to safely complete daily activities?: Yes Is the patient deaf or have difficulty hearing?: Yes(wears bilateral hearing aids) Does the patient have difficulty seeing, even when wearing glasses/contacts?: Yes Does the patient have difficulty concentrating, remembering, or making decisions?: No Patient able to express need for assistance with ADLs?: Yes Does the patient have difficulty dressing or bathing?: Yes Independently performs ADLs?: No Communication: Independent Dressing (OT): Needs assistance Is this a change from baseline?: Pre-admission baseline Grooming: Needs assistance Is this a change from baseline?: Pre-admission baseline Feeding: Needs  assistance Is this a change from baseline?: Pre-admission baseline Bathing: Needs assistance Is this a change from baseline?: Pre-admission baseline Toileting: Needs assistance Is this a change from baseline?: Pre-admission baseline In/Out Bed: Needs assistance Is this a change from baseline?: Pre-admission baseline Walks in Home: Needs assistance Is this a change from baseline?: Pre-admission baseline Does  the patient have difficulty walking or climbing stairs?: Yes(secondary to weakness) Weakness of Legs: Both Weakness of Arms/Hands: None  Permission Sought/Granted                  Emotional Assessment              Admission diagnosis:  Sigmoid diverticulitis [K57.32] Acute diverticulitis [K57.92] Diverticulitis of sigmoid colon [K57.32] Patient Active Problem List   Diagnosis Date Noted  . Pressure injury of skin 04/14/2019  . Diverticulitis of sigmoid colon 04/10/2019  . Sigmoid diverticulitis 04/10/2019  . Cirrhosis ([place]) 11/25/2018  . Community acquired pneumonia 10/27/2018  . Fever 10/26/2018  . Acute Respiratory failure with Hypoxia ([place]) 10/26/2018  . Acute hypoxemic respiratory failure ([place]) 04/15/2018  . Acute on chronic diastolic CHF (congestive heart failure) ([place]) 04/15/2018  . Malignant neoplasm of overlapping sites of left breast in female, estrogen receptor positive ([place]) 04/03/2018  . Recurrent breast adenocarcinoma, left ([place]) 04/03/2018  . Altered mental status 08/18/2017  . Acute encephalopathy 08/17/2017  . Acute Metabolic Encephalopathy acute 08/17/2017  . Encephalopathy 07/25/2017  . Diabetes mellitus ([place]) 09/02/2015  . Anemia due to multiple mechanisms 02/17/2015  . Chronic LBP 02/17/2015  . Impaired renal function 02/17/2015  . Abducens nerve weakness 02/17/2015  . S/P knee replacement 12/18/2014  . Osteoarthritis of right knee 12/18/2014  . IDA (iron deficiency anemia) 10/29/2013  . Vitamin B12 deficiency anemia 09/09/2013  . Anemia, iron deficiency 09/09/2013  . Lumbago 08/06/2013  . Difficulty in walking(719.7) 08/06/2013  . Osteopenia 03/11/2013  . Malignant neoplasm of lower-inner quadrant of left breast in female, estrogen receptor positive ([place]) 03/10/2013  . Bilateral leg weakness 09/23/2012  . Acute renal failure ([place]) 07/25/2012  . Dehydration 07/25/2012  . DM (diabetes mellitus), type 2, uncontrolled ([place]) 07/25/2012  .  Essential hypertension, benign 07/25/2012  . Generalized weakness 07/25/2012  . Hyperlipidemia   . Chronic kidney disease   . Hypertension   . GERD (gastroesophageal reflux disease)   . CAP (community acquired pneumonia) 04/19/2011  . Nausea and vomiting 04/19/2011  . HTN (hypertension) 04/19/2011  . DM type 2 (diabetes mellitus, type 2) ([place]) 04/19/2011  . Arthritis 04/19/2011   PCP:  Celene Squibb, MD Pharmacy:   [facility], [address] [place] [address] Phone: [phone] Fax: [place], [place] - [place] [address] Phone: [phone] Fax: [phone]     Social Determinants of Health (SDOH) Interventions    Readmission Risk Interventions No flowsheet data found.

## 2019-04-14 NOTE — Progress Notes (Signed)
[facility] Progress Note  Name: [name] MRN: [id number] DOB: August 27, 1933  Subjective:  Lasix had been ordered 60 mg IV every 8 hours per consulting nephro and earlier today was decreased today to every 12 hours per covering nephro MD.  She had 1.9 liters UOP over 2/14 and 1.1 liter of emesis/NG output charted.  Spoke with her daughter.  They state primary team told them they were consulting palliative- they haven't spoken with them yet.  She was labored with exertion this - better today.  Review of systems:  Shortness of breath - better  Nausea with vomiting - better after NG Ice chips today  ----- Background on consult:  The patient is a 86 y.o. year-old hx of HTN, HL, DM2, cirrhosis, breast Ca admitted on 2/10 w/ vomiting and diarrhea. Getting chemoRx.  Creat 2.5 on admission , alb 2.5, UA negative. CXR stable bronchiectasis of RLL. Abd CT showed cirrhosis w/ portal HTN changes. Creat up to 2.95 today, no UOP recorded until this am. Asked to see for renal failure.  Creat 2.4 > 2.15 > 2.21 > 2.64 > 2.95; BUN 56 >> 68. No UOP recorded until today after foley placed.  No hx seeing kidney doctor but her PCP has been following her kidney fxn for years per the daughter. 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   Pt having blistering sores in her mouth from Xeloda.  Had hx breast rx'd 5-10 yrs ago , then recurred more recently. Was on regular chemo, is now taking Xeloda. Tumor L breast has now broken through to the skin.  Dtr not sure of status of mets.    Intake/Output Summary (Last 24 hours) at 04/14/2019 1521 Last data filed at 04/14/2019 0530 Gross per 24 hour  Intake 143.73 ml  Output 2050 ml  Net -1906.27 ml    Vitals:  Vitals:   04/14/19 0800 04/14/19 1155 04/14/19 1200 04/14/19 1333  BP:   129/70   Pulse:   (!) 101   Resp:   (!) 22   Temp: 98.6 F (37 C) 98.3 F (36.8 C)    TempSrc: Oral Oral    SpO2:   91% 90%  Weight:      Height:         Physical Exam:   General elderly female in bed in no acute distress HEENT normocephalic atraumatic extraocular movements intact sclera anicteric Neck supple trachea midline Lungs clear to auscultation bilaterally but reduced breath sounds bases; no overt crackles; unlabored at rest on 3 liters oxygen Heart regular rate and rhythm no rubs or gallops appreciated Abdomen soft nontender distended/obese habitus Extremities 2+ edema  Psych normal mood and affect Neuro alert and oriented x 3; hard of hearing follows commands GU - foley in place    Medications reviewed     Labs:  BMP Latest Ref Rng & Units 04/14/2019 04/13/2019 04/13/2019  Glucose 70 - 99 mg/dL 139(H) 214(H) 213(H)  BUN 8 - 23 mg/dL 77(H) 68(H) 64(H)  Creatinine 0.44 - 1.00 mg/dL 3.37(H) 2.95(H) 2.64(H)  Sodium 135 - 145 mmol/L 138 132(L) 134(L)  Potassium 3.5 - 5.1 mmol/L 4.3 4.4 5.3(H)  Chloride 98 - 111 mmol/L 110 107 110  CO2 22 - 32 mmol/L 18(L) 14(L) 12(L)  Calcium 8.9 - 10.3 mg/dL 8.3(L) 8.0(L) 8.3(L)     Assessment/Plan:   # AKI - prerenal insults  - Decrease in diuretic frequency as ordered  - continue supportive renal measures  - Zosyn dosed per pharmacy -  She is not an HD candidate which I discussed again with pt and family 2/15.  Goals of care per primary team   They state palliative has been consulted and I encouraged pt and her daughter to discuss goals  # CKD stage IV  - baseline Cr 1.9- 2.4 in march- dec 2020, eGFR 17- 23.  - Pt admitted for N/V, diarrhea due to chemoRx , possibly other GI causes. In hosp x 4-5 days, now has NG tube in for distended stomach.  Has been getting IVF's but I/O's are not complete. Up 20 lbs since admission as of the consult.  No shock, no nephrotoxins. UA neg, US shows chronic changes.  Is getting IV abx.  Overloaded  # Acute hypoxic respiratory failure - on supplemental oxygen  - gentle diuresis as ordered   # Fluid vol overload - as above  # N/V, diarrhea - has NG in now  # Breast  Ca - on xeloda  # DM 2 - per primary team   # Hyponatremia - hypervolemic and improving   # Cirrhosis w/ portal HTN - noted.  Diuresing as above    Claudia Desanctis, MD 04/14/2019 3:50 PM

## 2019-04-14 NOTE — Progress Notes (Signed)
HEMATOLOGY-ONCOLOGY PROGRESS NOTE  SUBJECTIVE: Stacey [name] is now in the ICU, NG tube in place, PICC line right upper extremity.  She has some soreness from the PICC line and of course the NG tube is scratching her throat and "driving me crazy".  She tells me the mouth sores are better.  She has had no further diarrhea problems and currently denies any bowel movements in more than 24 hours.  No family in room    PHYSICAL EXAMINATION: Elderly white [name] examined in bed   Vitals:   04/14/19 0545 04/14/19 0734  BP:    Pulse: 92   Resp: 19   Temp:    SpO2: 91% 92%   Filed Weights   04/13/19 0824 04/13/19 1301 04/14/19 0500  Weight: 221 lb 9 oz (100.5 kg) 220 lb 14.4 oz (100.2 kg) 213 lb 6.5 oz (96.8 kg)    Intake/Output from previous day: 02/14 0701 - 02/15 0700 In: 143.7 [IV Piggyback:143.7] Out: 2925 [Urine:1875; Emesis/NG output:1050]  LABORATORY DATA:  I have reviewed the data as listed CMP Latest Ref Rng & Units 04/14/2019 04/13/2019 04/13/2019  Glucose 70 - 99 mg/dL 139(H) 214(H) 213(H)  BUN 8 - 23 mg/dL 77(H) 68(H) 64(H)  Creatinine 0.44 - 1.00 mg/dL 3.37(H) 2.95(H) 2.64(H)  Sodium 135 - 145 mmol/L 138 132(L) 134(L)  Potassium 3.5 - 5.1 mmol/L 4.3 4.4 5.3(H)  Chloride 98 - 111 mmol/L 110 107 110  CO2 22 - 32 mmol/L 18(L) 14(L) 12(L)  Calcium 8.9 - 10.3 mg/dL 8.3(L) 8.0(L) 8.3(L)  Total Protein 6.5 - 8.1 g/dL 3.8(L) - 4.0(L)  Total Bilirubin 0.3 - 1.2 mg/dL 1.5(H) - 1.6(H)  Alkaline Phos 38 - 126 U/L 59 - 59  AST 15 - 41 U/L 20 - 26  ALT 0 - 44 U/L 11 - 11    Lab Results  Component Value Date   WBC 13.1 (H) 04/14/2019   HGB 14.2 04/14/2019   HCT 41.8 04/14/2019   MCV 113.3 (H) 04/14/2019   PLT 169 04/14/2019   NEUTROABS 8.1 (H) 04/05/2019    CT ABDOMEN PELVIS WO CONTRAST  Result Date: 04/13/2019 CLINICAL DATA:  Diverticulitis. Nausea and vomiting for 5 days. History of breast cancer. EXAM: CT ABDOMEN AND PELVIS WITHOUT CONTRAST TECHNIQUE: Multidetector CT imaging  of the abdomen and pelvis was performed following the standard protocol without IV contrast. COMPARISON:  04/20/2019 FINDINGS: Lower chest: New small bilateral pleural effusions with passive atelectasis. Stable coronary atherosclerosis. Feeding tube extends through the distal esophagus into the stomach body. Mild atelectasis in the lingula. Hepatobiliary: Hepatic cirrhosis with nodular contour. Gallbladder surgically absent. Pancreas: Unremarkable Spleen: Stable hypodense 1.7 by 1.2 cm anterior splenic lesion on image 23/3, no change from the recent prior exam. Adrenals/Urinary Tract: Foley catheter in the empty urinary bladder. Left renal atrophy. Adrenal glands unremarkable. Stomach/Bowel: Periampullary duodenal diverticulum. Sigmoid colon diverticulosis. Mild residual stranding along the sigmoid colon although improved from prior, compatible with improving diverticulitis. Vascular/Lymphatic: Aortoiliac atherosclerotic vascular disease. Portosystemic collaterals along the retroperitoneum. Reproductive: Uterus absent. Adnexa unremarkable. Other: Moderate ascites, new compared to the prior exam. Increased flank edema along the lateral abdominal wall musculature and hips. Stable low-level edema at the root of the mesentery. Musculoskeletal: Stable fusions from L1 through L5 with posterolateral rod and pedicle screw fixation on the left at L2-L3-L4 and with a separate left rod at L5-S1; and on the right at L2-3 and separately at L4-L5-S1. Chronic compressions at all levels between T12 and L4 inclusive. Posterior decompression of much of  the lumbar spine. Osseous foraminal narrowing on the right at T12-L1, L2-3, and L3-4; and on the left at T12-L1, L4-5, and L5-S1. No appreciable lucency around the pedicle screws. No new fracture. IMPRESSION: 1. New moderate ascites. New small bilateral pleural effusions and increased edema along the flanks and hips in the subcutaneous tissues, compatible with accentuated third spacing  of fluid. 2. Improved sigmoid diverticulitis, nearly resolved. 3. Hepatic cirrhosis with portal venous hypertension. 4. Stable hypodense lesion in the anterior spleen. 5. Other imaging findings of potential clinical significance: Coronary atherosclerosis. Left renal atrophy. Periampullary duodenal diverticulum. Stable postoperative findings in the lumbar spine with multilevel chronic compressions and multilevel bony foraminal impingement. Electronically Signed   By: Van Clines M.D.   On: 04/13/2019 17:47   CT ABDOMEN PELVIS WO CONTRAST  Result Date: 04/26/2019 CLINICAL DATA:  Generalized abdominal pain. EXAM: CT ABDOMEN AND PELVIS WITHOUT CONTRAST TECHNIQUE: Multidetector CT imaging of the abdomen and pelvis was performed following the standard protocol without IV contrast due to renal insufficiency. COMPARISON:  August 29th 2020. FINDINGS: Lower chest: No acute abnormality. Hepatobiliary: Status post cholecystectomy. No biliary dilatation is noted. Nodular hepatic contours are noted consistent with hepatic cirrhosis. No focal hepatic abnormality is noted on these unenhanced images. Pancreas: Unremarkable. No pancreatic ductal dilatation or surrounding inflammatory changes. Spleen: Stable 17 mm low density is noted in anterior portion of the spleen. Stable mild splenomegaly is noted. Adrenals/Urinary Tract: Adrenal glands appear normal. Left renal atrophy is noted. No hydronephrosis or renal obstruction is noted. No renal or ureteral calculi are noted. Urinary bladder is unremarkable. Stomach/Bowel: The stomach appears normal. There is no evidence of bowel obstruction. The appendix is not clearly visualized, but no inflammation is noted in the right lower quadrant. However, diverticulosis is noted involving the sigmoid colon with surrounding inflammation consistent with diverticulitis. Vascular/Lymphatic: Atherosclerosis of abdominal aorta is noted without aneurysm formation. No significant adenopathy is  noted. Multiple collateral veins are noted in the retroperitoneal region consistent with portal hypertension. Reproductive: Status post hysterectomy. No adnexal masses. Other: No abdominal wall hernia or abnormality. No abdominopelvic ascites. Musculoskeletal: Extensive postsurgical and degenerative changes are noted in the lumbar spine. No acute abnormality is noted. IMPRESSION: 1. Findings consistent with sigmoid diverticulitis. No abscess is noted. 2. Findings consistent with hepatic cirrhosis with associated portal hypertension and mild splenomegaly. 3. Left renal atrophy is noted. No hydronephrosis or renal obstruction is noted. 4. Aortic atherosclerosis. Aortic Atherosclerosis (ICD10-I70.0). Electronically Signed   By: Marijo Conception M.D.   On: 04/03/2019 10:42   DG Abd 1 View  Result Date: 04/13/2019 CLINICAL DATA:  Nausea and vomiting for 2 days EXAM: ABDOMEN - 1 VIEW COMPARISON:  04/10/2019 FINDINGS: Scattered gas pattern is noted. The stomach is distended. A paucity of distal colonic gas is seen which may be related to the underlying diverticulitis. No free air is seen. Postsurgical and degenerative changes of lumbar spine are noted. IMPRESSION: No acute abnormality seen. Electronically Signed   By: Inez Catalina M.D.   On: 04/13/2019 09:04   CT CHEST WO CONTRAST  Result Date: 04/19/2019 CLINICAL DATA:  Breast cancer surveillance and staging. EXAM: CT CHEST WITHOUT CONTRAST TECHNIQUE: Multidetector CT imaging of the chest was performed following the standard protocol without IV contrast. COMPARISON:  Chest radiography same day.  CT 10/26/2018 FINDINGS: Cardiovascular: Atherosclerosis of the aorta and its branch vessels as seen previously. Coronary artery calcification. Heart size is normal. No pericardial fluid. Mediastinum/Nodes: No mediastinal or hilar mass or  lymphadenopathy. Lungs/Pleura: No sign of pulmonary metastatic disease. Few areas of pulmonary scarring. No pleural fluid. Upper Abdomen:  Chronic cirrhosis of the liver. No acute abdominal finding. Very small amount of fluid adjacent to the liver edge. Musculoskeletal: Left breast skin thickening with internal breast density, nonspecific by CT and fairly similar to the previous study. No sign of osseous metastatic disease. Chronic degenerative change of the spine in the lower thoracic and upper lumbar region, subsequent to previous lumbar fusion. No significant change since the previous study. IMPRESSION: No sign of metastatic disease in the chest region. Skin thickening of the left breast with internal left breast density, nonspecific by CT. Aortic atherosclerosis.  Coronary artery calcification. Chronic thoracolumbar degenerative changes without evidence bony metastatic disease or significant change since last year. Electronically Signed   By: Nelson Chimes M.D.   On: 04/14/2019 19:41   US RENAL  Result Date: 04/13/2019 CLINICAL DATA:  Acute kidney injury. History of diabetes, chronic kidney disease, cirrhosis. EXAM: RENAL / URINARY TRACT ULTRASOUND COMPLETE COMPARISON:  Abdomen CT dated 04/22/2019. FINDINGS: Right Kidney: Renal measurements: 10.2 x 4.8 x 4.8 cm = volume: 123 mL. Echogenic cortex. No mass or hydronephrosis. Left Kidney: Renal measurements: 9 x 4 x 4.3 cm = volume: 83 mL. Echogenic cortex. Atrophic, corresponding to CT appearance. No mass or hydronephrosis. Bladder: Decompressed by Foley catheter. Other: None. IMPRESSION: 1. Both kidneys are echogenic indicating chronic medical renal disease. 2. Slight LEFT kidney is somewhat atrophic, as also described on recent CT abdomen report of 04/22/2019. 3. No acute findings.  No hydronephrosis. 4. Bladder decompressed by Foley catheter. Electronically Signed   By: Franki Cabot M.D.   On: 04/13/2019 14:59   DG CHEST PORT 1 VIEW  Result Date: 04/13/2019 CLINICAL DATA:  Shortness of breath EXAM: PORTABLE CHEST 1 VIEW COMPARISON:  04/27/2019 FINDINGS: Cardiac shadow is stable. Aortic  calcifications are again seen. The lungs are well aerated bilaterally. No focal infiltrate or sizable effusion is seen. Minimal atelectatic changes are noted in the left base. IMPRESSION: Mild left basilar atelectasis.  No acute abnormality noted. Electronically Signed   By: Inez Catalina M.D.   On: 04/13/2019 09:00   DG Chest Portable 1 View  Result Date: 04/22/2019 CLINICAL DATA:  86 year old with current history of recurrent estrogen receptor positive breast cancer for which the patient is undergoing chemotherapy, presenting with acute onset of myalgias, nausea and diarrhea that began yesterday. EXAM: PORTABLE CHEST 1 VIEW COMPARISON:  CT chest 10/26/2018. Chest x-rays 10/26/2018 and earlier. FINDINGS: Cardiac silhouette upper normal in size for AP portable technique, unchanged. Thoracic aorta atherosclerotic, unchanged. Prominent central RIGHT pulmonary vessels, unchanged. Bronchiectasis involving the RIGHT LOWER LOBE, unchanged. Lungs clear. No confluent or ground-glass airspace consolidation. Normal pulmonary vascularity. No visible pleural effusions. IMPRESSION: No acute cardiopulmonary disease. Stable bronchiectasis involving the RIGHT LOWER LOBE. Electronically Signed   By: Evangeline Dakin M.D.   On: 04/17/2019 09:44   DG Abd Portable 1V  Result Date: 04/13/2019 CLINICAL DATA:  Check gastric catheter placement EXAM: PORTABLE ABDOMEN - 1 VIEW COMPARISON:  04/13/2019 FINDINGS: Gastric catheter is now been placed in the distal stomach. The stomach is decompressed. There remains some gaseous dilation of the transverse colon. No other focal abnormality is noted. IMPRESSION: Gastric catheter within the stomach. Electronically Signed   By: Inez Catalina M.D.   On: 04/13/2019 09:50   [place] EKG SITE RITE  Result Date: 04/13/2019 If Site Rite image not attached, placement could not be confirmed due  to current cardiac rhythm.   ASSESSMENT: 86 y.o. Stacey [name] status post left lumpectomy and sentinel  lymph node sampling 03/12/2012 for a  pT1b pN0, stage IA invasive ductal carcinoma, grade 3, strongly estrogen and progesterone receptor positive (100 and 98%), HER-2 nonamplified, with an MIB-1 of 15%             (a) additional surgery for margin clearance was successful 03/22/2012   (1) the patient opted against adjuvant radiation   (2) anastrozole started March 2014, discontinued 04/03/2018 with evidence of recurrence   RECURRENT DISEASE: February 2020 (3) status post left breast retroareolar biopsy 03/05/2018 for a clinical T1c N1, stage IIA invasive ductal carcinoma, estrogen and progesterone receptor positive, HER-2 not amplified, with an MIB-1 of 20%.   (4) spread to skin/chest wall on left noted 04/03/2018             (a) chest CT 04/11/2018 scan showed nonspecific bilateral axillary adenopathy              (b) bone scan 04/11/2018 shows only degenerative disease             (c) CA 27-29 not informative (was 25.6 on 04/26/2018)             (d) biopsy of 2 left breast skin nodules 04/04/2018 shows invasive carcinoma, estrogen receptor 100% positive, progesterone receptor and HER-2 negative, with an MIB-1 of 30%.   (5) started fulvestrant 04/12/2018             (a) added palbociclib 04/26/2018 at 125 mg p.o. daily, 21 days on, 7 days off                         (1) dose decreased to 75 mg daily, 21/03 September 2018                         (2) dose decreased to 75 mg every other day, 21/7 with October 2020 cycle             (b) tamoxifen added and fulvestrant changed to every 8 weeks after 08/13/2018 dose to minimize exposure during the pandemic, discontinued when monthly fulvestrant resumed             (c) fulvestrant resumed at every 4 weeks beginning with 11/25/2018 dose             (d) fulvestrant and palbociclib discontinued 02/17/2019 with evidence of chest wall progression   (6) RESTAGING:  (a) CT of the head, chest, abdomen, and pelvis August/September 2020 shows no evidence of  metastatic disease outside of the left breast             (b) non-contrast CT chest/abd/pelvis 04/13/2019 shows no evidence of lung, liver or bone lesions   (7) capecitabine started 03/03/2019, initially at 1000 mg p.o. twice daily.             (a) dose increased to 1500 mg in the morning and 1000 mg in the evening starting 03/24/2019  (b) capecitabine discontinued 04/07/2019 with significant side effects (mouth sores/diarrhea)-- also no evidence of response     PLAN: Stacey [name] is slowly recovering from her capecitabine side effects-- diarrhea has resolved; mouth sores subjectively better. Hopefully within 48 hrs she can resume oral inrake and start getting mobilized.  While she has multiple other issues (cirrhosis, diverticulosis, left renal atrophy with CKI, etc) and is currently volume overloaded, there is no  evidence of immediately life-threatening spread of her cancer. I agree with DNR, but anticipate she will be going home later this week.  Will follow with you while inpatient. Will consider palliative radiation as outpatient.     LOS: 4 days   Chauncey Cruel, DNP, AGPCNP-BC, AOCNP 04/14/19   ADDENDUM: Unfortunately Stacey [name] was not able to tolerate the slight increase made in her capecitabine dose-- this has ked to severe diarrhea and mouth sores. This should resolve  over the next 24-72 hours with IVF and supportive care.  It is not inconceivable that she could have c difficile diarrhea and we will enter that order but capecitabine is a sufficient explanation of her current symptoms.   She will return to see me in the clinic and we will return to anti-estrogens (likely everolimus/exemestane) as her next treatment protocol.  I greatly appreciate your help to thia patient and her family  I personally saw this patient and performed a substantive portion of this encounter with the listed APP documented above.   Chauncey Cruel, MD Medical Oncology and Hematology [facility] [address] [place], [place] [address] Tel. [phone]    Fax. [phone]

## 2019-04-14 NOTE — Progress Notes (Signed)
PROGRESS NOTE    [name]  MRN:[id number] DOB: 12/30/1933 DOA: 04/16/2019 PCP: Hall, John Z, MD   Brief Narrative: 85 YOF with significant history of anemia/osteoarthritis, left breast cancer, cataracts, CKD IV, liver cirrhosis, T2DM on long-term insulin, GERD,hyperlipidemia, hypertension history of multiple falls in the past, nocturia, seasonal allergies, history of facial melanoma with surgical excision in 2012 comes to the ER with multiple episodes of nausea vomiting and diarrhea following her chemotherapy cycle this week without melena hematochezia dysuria but with low-grade fever since Saturday, decreased oral intake for several days. In the ED vitals are stable UA CBC and CMP shows metabolic acidosis/CKD chronic.  LFTs lipase normal COVID-19) [place] negative, underwent chest x-ray no acute finding, and CT abdomen without contrast showed sigmoid diverticulitis, liver cirrhosis.Patient was placed on IV antibiotic and was admitted on liquid diet and seen by her oncology.C diff negative.  2/14-no more diarrhea, but now having nasuea and also vomited x2 overnight and needing [place] o2. NGT placed with improvement in symptoms, blood pressure soft and no urine output given IV fluids Foley placed, had AKI on CKD nephro/surgery was consulted. Moved to SDU.  Subjective:  Seen this morning in the stepdown unit.  Alert awake NG tube in place had 1050 mL output in canister, urine output since yesterday 1875 ml in Foley. C/O some pain at the PICC line site and throat irritation.  Denies any abdominal pain.  No diarrhea but has not had a bowel movement. On 3l nasal cannula  Assessment & Plan:  Nausea vomiting diarrhea with Sigmoid diverticulitis, also contributed by chemotherapy/presumed ilues: diarrhea had improved-but persistent nauseous and vomiting and had no BM- xray dialted stomach, Transverse colon-suspected ileus-NG tube placed 2/14 and seen by surgery.Continue NG tube suction as per surgery  remains n.p.o.  Repeat CT abdomen pelvis 2/14 shows improved diverticulitis no bowel obstruction.  Patient on Zosyn pharmacy dosing.Wbc slightly bumped up but no fever or leukocytosis.    Acute hypoxic respiratory failure: Suspect due to abd distension ileus, some fluid overload.  proBNP 900 but (ECHO 04/15/2018 shows lvef of 55-60%),has CKD.Lasix was given as per nephrology.Patient weight gain noted from 203->221,( wt was checked 1 wk  PTA was at 203 lb for pcp too per daughter, had significant wt gain and was treated w lasix on recent hospital admission and on torsemide 20 mg daily) wt down to 213 with Lasix overnight.Denies chest pain shortness of breath.Continue supplemental oxygen and wean as tolerated.   Lactic acidosis: Likely multifactorial in setting of nausea vomiting ileus, CKD and also poor clearance due to her liver cirrhosis. trended up to 3.8 and was trending down on repeat. Currently afebrile hemodynamically stable.  CT abdomen without abscess and shows improved diverticulitis.  Hypokalemia/hyperkalemia- k stable  AKI on CKD stage IV/left renal atrophy/metabolic acidosis,chronic baseline creatinine 2.1-2.5. bun/creat worsening- hco3 at 12 with normal anion gap 12- s/p ivf and bicarb due to low urine output on 2/14-also had Foley placed and renal ultrasound obtained- had good urine output and nephro was consulted  And placed on lasix, stopped ivf 2/14. Overnight noted bump in creatinine 2.9-->3.37. Continue Foley, monitor intake output closely, defer further diuretics and fluid management to nephrology.I have discussed with Dr. Schertz yesterday evening and again discussed with Dr. Upton -she will be rounding.  DM type 2 on insulin,uncontrolled A1c 11.6 04/01/2019,uncontrolled hyperglycemia: Hold glipizide, cont lantus at lower dose at 15 units while npo- at home on Tresiba 50 units daily.Continue sliding scale.  Sugar appears stable.    Hyperlipidemia:hold statin.  Mouth ulcer likely 2/2  chemo.Was placed on Valtrex/diflucan and magic mouthwash per onco.  She feels her mouth sore has improved.  Dehydration/hypovolemia/hyponatremia in the setting of poor oral intake with nausea/vomiting - received ivf ns and bolus 2/14.  Somewhat fluid overloaded and was given Lasix by nephro. Monitor and will f/u nephro recommendations  Recurrent breast adenocarcinoma, left: Cepacetabine on hold,oncology has been consulted and will follow up as outpatient.Discussed with oncologist plan is for outpatient follow-up possible radiation to her left chest wall disease.  Cirrhosis with portal hypertension:Appears compensated. Bilirubin slightly abnormal.Monitor. Anasarca with ascites small bilateral pleural effusion edema along the flank and hips and subcutaneous tissue/third spacing seen-in the CT scan abdomen: suspect multifactorial due to hypoalbuminemia/liver cirrhosis CKD IV fluids.  Monitor volume status closely.  HTN: BP was soft 2/14. Needed ivf. Stable now.Stopped her home amlodipine, metoprolol 2/14.  GERD:IV Protonix 40 daily.    Morbid obesity with Body mass index is 41.68 kg/m.  With hypoalbuminemia augment nutrition once able to  Rashes:recently seen by dermatology.  Multilevel chronic back compressions and multilevel bony foraminal impingement, status post operative findings sen in CT.   Debility/deconditioning/multiple comorbidities: Palliative care consulted.  Patient's poor candidate for dialysis and patient/family.  Patient is at risk of acute decompensation and death.  DVT prophylaxis: SCD/ Heparin. Communication: plan of care discussed with patient at bedside.I called her daughter and also updated her at bedside 2/14. Updated daughter and she will be here at 1.3- till 8 pm.She agrees w/ palliative care.She does not want to see her in pain and wants her to be comfortable.  Understand that given her all his multiple comorbidities she is at risk for acute decompensation.   Code  Status: she is DNR, Consult Palliative care.  Disposition Plan: Patient is from: Home Anticipated Disposition: TBD Barriers to discharge or conditions that needs to be met prior to discharge: Once NG tube out, able to tolerate diet and renal function stable  Consultants: Oncology, surgery, Nephro, palliative care. Procedures:see note  ECHO 2/172020 The left ventricle has normal systolic function, with an ejection  fraction of 55-60%. The cavity size was normal. There is moderately  increased left ventricular wall thickness. Left ventricular diastolic  parameters were normal.  Microbiology:see note   Medications: Scheduled Meds: . aspirin EC  81 mg Oral Daily  . brimonidine  1 drop Both Eyes BID  . budesonide  2 mL Inhalation BID  . Chlorhexidine Gluconate Cloth  6 each Topical Daily  . dorzolamide  1 drop Both Eyes BID  . fluconazole  200 mg Oral Daily  . furosemide  60 mg Intravenous Q8H  . heparin  5,000 Units Subcutaneous Q8H  . insulin aspart  0-15 Units Subcutaneous Q6H  . insulin glargine  15 Units Subcutaneous Daily  . ipratropium-albuterol  3 mL Nebulization TID  . latanoprost  1 drop Both Eyes QHS  . magic mouthwash w/lidocaine  5 mL Oral QID  . mouth rinse  15 mL Mouth Rinse BID  . mirabegron ER  50 mg Oral Daily  . mirtazapine  15 mg Oral QHS  . sodium chloride flush  10-40 mL Intracatheter Q12H  . valACYclovir  500 mg Oral BID   Continuous Infusions: . piperacillin-tazobactam (ZOSYN)  IV Stopped (04/14/19 0558)    Antimicrobials: Anti-infectives (From admission, onward)   Start     Dose/Rate Route Frequency Ordered Stop   04/13/19 1200  piperacillin-tazobactam (ZOSYN) IVPB 2.25 g     2.25 g   100 mL/hr over 30 Minutes Intravenous Every 6 hours 04/13/19 0951     04/10/19 1200  ciprofloxacin (CIPRO) IVPB 400 mg  Status:  Discontinued     400 mg 200 mL/hr over 60 Minutes Intravenous Every 24 hours 04/01/2019 1530 04/13/19 0931   04/10/19 0000  ciprofloxacin  (CIPRO) IVPB 400 mg  Status:  Discontinued     400 mg 200 mL/hr over 60 Minutes Intravenous Every 12 hours 04/02/2019 1523 04/08/2019 1530   04/19/2019 2200  metroNIDAZOLE (FLAGYL) IVPB 500 mg  Status:  Discontinued     500 mg 100 mL/hr over 60 Minutes Intravenous Every 8 hours 04/27/2019 1523 04/13/19 0931   04/17/2019 2200  valACYclovir (VALTREX) tablet 500 mg     500 mg Oral 2 times daily 04/06/2019 1740     04/08/2019 1830  fluconazole (DIFLUCAN) tablet 200 mg     200 mg Oral Daily 04/24/2019 1740     04/17/2019 1115  ciprofloxacin (CIPRO) IVPB 400 mg     400 mg 200 mL/hr over 60 Minutes Intravenous  Once 04/23/2019 1101 03/31/2019 1339   04/10/2019 1115  metroNIDAZOLE (FLAGYL) IVPB 500 mg     500 mg 100 mL/hr over 60 Minutes Intravenous  Once 04/19/2019 1101 04/23/2019 1339     Objective: Vitals:   04/14/19 0541 04/14/19 0545 04/14/19 0734 04/14/19 0800  BP: (!) 119/57     Pulse: 99 92    Resp: 20 19    Temp:    98.6 F (37 C)  TempSrc:    Oral  SpO2: 93% 91% 92%   Weight:      Height:        Intake/Output Summary (Last 24 hours) at 04/14/2019 1019 Last data filed at 04/14/2019 0530 Gross per 24 hour  Intake 143.73 ml  Output 2925 ml  Net -2781.27 ml   Filed Weights   04/13/19 0824 04/13/19 1301 04/14/19 0500  Weight: 100.5 kg 100.2 kg 96.8 kg   Weight change:   Body mass index is 41.68 kg/m.  Intake/Output from previous day: 02/14 0701 - 02/15 0700 In: 143.7 [IV Piggyback:143.7] Out: 2925 [Urine:1875; Emesis/NG output:1050] Intake/Output this shift: No intake/output data recorded.  Examination: General exam: Alert awake oriented x3, obese, on nasal cannula oxygen.NAD.NG tube present.   HEENT:Oral mucosa dry/chapped,Ear/Nose WNL grossly, dentition normal. Respiratory system: bilaterally basal crackles present,no use of accessory muscle Cardiovascular system: S1 & S2 +,No JVD,. Gastrointestinal system: Abdomen soft, obese/distended nontender, bowel sounds sluggish.  Nervous  System:Alert, awake, moving extremities and grossly nonfocal Extremities: Mild leg edema, distal peripheral pulses palpable.  Skin: mild rashes,no icterus. MSK: Normal muscle bulk,tone, power.  Data Reviewed: I have personally reviewed following labs and imaging studies  CBC: Recent Labs  Lab 04/08/19 1420 04/08/19 1420 04/01/2019 1035 04/03/2019 1035 04/10/19 0454 04/11/19 0729 04/12/19 0439 04/13/19 0536 04/14/19 0544  WBC 8.3   < > 10.3   < > 10.4 11.1* 10.3 11.1* 13.1*  NEUTROABS 6.3  --  8.1*  --   --   --   --   --   --   HGB 11.1*   < > 13.2   < > 13.3 13.0 13.3 14.4 14.2  HCT 32.4*   < > 38.0   < > 39.4 37.2 39.3 41.8 41.8  MCV 109.1*   < > 109.5*   < > 111.3* 110.4* 112.0* 109.4* 113.3*  PLT 93*   < > 110*   < > 106* 128* 141* 160 169   < > =  values in this interval not displayed.   Basic Metabolic Panel: Recent Labs  Lab 04/11/19 0729 04/12/19 0439 04/13/19 0536 04/13/19 1252 04/14/19 0544  NA 135 130* 134* 132* 138  K 3.8 4.0 5.3* 4.4 4.3  CL 109 106 110 107 110  CO2 17* 15* 12* 14* 18*  GLUCOSE 189* 275* 213* 214* 139*  BUN 55* 53* 64* 68* 77*  CREATININE 2.28* 2.21* 2.64* 2.95* 3.37*  CALCIUM 7.8* 7.8* 8.3* 8.0* 8.3*  MG  --   --   --   --  2.1   GFR: Estimated Creatinine Clearance: 13.2 mL/min (A) (by C-G formula based on SCr of 3.37 mg/dL (H)). Liver Function Tests: Recent Labs  Lab 04/14/2019 1035 04/10/19 0454 04/12/19 0439 04/13/19 0536 04/14/19 0544  AST _0 ALT _1 ALKPHOS 63 54 58 59 59  BILITOT 1.3* 1.5* 1.3* 1.6* 1.5*  PROT 5.2* 4.8* 4.1* 4.0* 3.8*  ALBUMIN 2.6* 2.4* 2.1* 2.0* 2.0*   Recent Labs  Lab 04/04/2019 1035  LIPASE 27   No results for input(s): AMMONIA in the last 168 hours. Coagulation Profile: Recent Labs  Lab 04/10/19 0454  INR 1.7*   Cardiac Enzymes: No results for input(s): CKTOTAL, CKMB, CKMBINDEX, TROPONINI in the last 168 hours. BNP (last 3 results) No results for input(s): PROBNP in  the last 8760 hours. HbA1C: No results for input(s): HGBA1C in the last 72 hours. CBG: Recent Labs  Lab 04/13/19 0736 04/13/19 1135 04/13/19 2025 04/14/19 0003 04/14/19 0524  GLUCAP 206* 197* 184* 161* 129*   Lipid Profile: No results for input(s): CHOL, HDL, LDLCALC, TRIG, CHOLHDL, LDLDIRECT in the last 72 hours. Thyroid Function Tests: No results for input(s): TSH, T4TOTAL, FREET4, T3FREE, THYROIDAB in the last 72 hours. Anemia Panel: No results for input(s): VITAMINB12, FOLATE, FERRITIN, TIBC, IRON, RETICCTPCT in the last 72 hours. Sepsis Labs: Recent Labs  Lab 04/13/19 9201 04/13/19 1252 04/13/19 1800  LATICACIDVEN 2.9* 3.8* 3.2*    Recent Results (from the past 240 hour(s))  Respiratory Panel by RT PCR (Flu A&B, Covid) - Nasopharyngeal Swab     Status: None   Collection Time: 04/13/2019 11:02 AM   Specimen: Nasopharyngeal Swab  Result Value Ref Range Status   SARS Coronavirus 2 by RT PCR NEGATIVE NEGATIVE Final    Comment: (NOTE) SARS-CoV-2 target nucleic acids are NOT DETECTED. The SARS-CoV-2 RNA is generally detectable in upper respiratoy specimens during the acute phase of infection. The lowest concentration of SARS-CoV-2 viral copies this assay can detect is 131 copies/mL. A negative result does not preclude SARS-Cov-2 infection and should not be used as the sole basis for treatment or other patient management decisions. A negative result may occur with  improper specimen collection/handling, submission of specimen other than nasopharyngeal swab, presence of viral mutation(s) within the areas targeted by this assay, and inadequate number of viral copies (<131 copies/mL). A negative result must be combined with clinical observations, patient history, and epidemiological information. The expected result is Negative. Fact Sheet for Patients:  PinkCheek.be Fact Sheet for Healthcare Providers:   GravelBags.it This test is not yet ap proved or cleared by the [place] FDA and  has been authorized for detection and/or diagnosis of SARS-CoV-2 by FDA under an Emergency Use Authorization (EUA). This EUA will remain  in effect (meaning this test can be used) for the duration of the COVID-19 declaration under Section 564(b)(1) of the Act, 21 U.S.C. section 360bbb-3(b)(1), unless the  authorization is terminated or revoked sooner.    Influenza A by PCR NEGATIVE NEGATIVE Final   Influenza B by PCR NEGATIVE NEGATIVE Final    Comment: (NOTE) The Xpert Xpress SARS-CoV-2/FLU/RSV assay is intended as an aid in  the diagnosis of influenza from Nasopharyngeal swab specimens and  should not be used as a sole basis for treatment. Nasal washings and  aspirates are unacceptable for Xpert Xpress SARS-CoV-2/FLU/RSV  testing. Fact Sheet for Patients: PinkCheek.be Fact Sheet for Healthcare Providers: GravelBags.it This test is not yet approved or cleared by the [place] FDA and  has been authorized for detection and/or diagnosis of SARS-CoV-2 by  FDA under an Emergency Use Authorization (EUA). This EUA will remain  in effect (meaning this test can be used) for the duration of the  Covid-19 declaration under Section 564(b)(1) of the Act, 21  U.S.C. section 360bbb-3(b)(1), unless the authorization is  terminated or revoked. Performed at [facility], [place] [address] [place], [place] [address]   MRSA PCR Screening     Status: None   Collection Time: 03/31/2019  6:38 PM   Specimen: Nasopharyngeal  Result Value Ref Range Status   MRSA by PCR NEGATIVE NEGATIVE Final    Comment:        The GeneXpert MRSA Assay (FDA approved for NASAL specimens only), is one component of a comprehensive MRSA colonization surveillance program. It is not intended to diagnose MRSA infection nor to guide  or monitor treatment for MRSA infections. Performed at [facility], [place] [address] [place], [place] [address]   C difficile quick scan w PCR reflex     Status: None   Collection Time: 04/10/19  6:06 PM   Specimen: STOOL  Result Value Ref Range Status   C Diff antigen NEGATIVE NEGATIVE Final   C Diff toxin NEGATIVE NEGATIVE Final   C Diff interpretation No C. difficile detected.  Final    Comment: Performed at [facility], [place] [address] [place], [place] [address]  MRSA PCR Screening     Status: None   Collection Time: 04/13/19 12:18 PM   Specimen: Nasal Mucosa; Nasopharyngeal  Result Value Ref Range Status   MRSA by PCR NEGATIVE NEGATIVE Final    Comment:        The GeneXpert MRSA Assay (FDA approved for NASAL specimens only), is one component of a comprehensive MRSA colonization surveillance program. It is not intended to diagnose MRSA infection nor to guide or monitor treatment for MRSA infections. Performed at [facility], [place] [address], [place], [place] [address]     Radiology Studies: CT ABDOMEN PELVIS WO CONTRAST  Result Date: 04/13/2019 CLINICAL DATA:  Diverticulitis. Nausea and vomiting for 5 days. History of breast cancer. EXAM: CT ABDOMEN AND PELVIS WITHOUT CONTRAST TECHNIQUE: Multidetector CT imaging of the abdomen and pelvis was performed following the standard protocol without IV contrast. COMPARISON:  04/23/2019 FINDINGS: Lower chest: New small bilateral pleural effusions with passive atelectasis. Stable coronary atherosclerosis. Feeding tube extends through the distal esophagus into the stomach body. Mild atelectasis in the lingula. Hepatobiliary: Hepatic cirrhosis with nodular contour. Gallbladder surgically absent. Pancreas: Unremarkable Spleen: Stable hypodense 1.7 by 1.2 cm anterior splenic lesion on image 23/3, no change from the recent prior exam. Adrenals/Urinary Tract: Foley catheter in the empty  urinary bladder. Left renal atrophy. Adrenal glands unremarkable. Stomach/Bowel: Periampullary duodenal diverticulum. Sigmoid colon diverticulosis. Mild residual stranding along the sigmoid colon although improved from prior, compatible with  improving diverticulitis. Vascular/Lymphatic: Aortoiliac atherosclerotic vascular disease. Portosystemic collaterals along the retroperitoneum. Reproductive: Uterus absent. Adnexa unremarkable. Other: Moderate ascites, new compared to the prior exam. Increased flank edema along the lateral abdominal wall musculature and hips. Stable low-level edema at the root of the mesentery. Musculoskeletal: Stable fusions from L1 through L5 with posterolateral rod and pedicle screw fixation on the left at L2-L3-L4 and with a separate left rod at L5-S1; and on the right at L2-3 and separately at L4-L5-S1. Chronic compressions at all levels between T12 and L4 inclusive. Posterior decompression of much of the lumbar spine. Osseous foraminal narrowing on the right at T12-L1, L2-3, and L3-4; and on the left at T12-L1, L4-5, and L5-S1. No appreciable lucency around the pedicle screws. No new fracture. IMPRESSION: 1. New moderate ascites. New small bilateral pleural effusions and increased edema along the flanks and hips in the subcutaneous tissues, compatible with accentuated third spacing of fluid. 2. Improved sigmoid diverticulitis, nearly resolved. 3. Hepatic cirrhosis with portal venous hypertension. 4. Stable hypodense lesion in the anterior spleen. 5. Other imaging findings of potential clinical significance: Coronary atherosclerosis. Left renal atrophy. Periampullary duodenal diverticulum. Stable postoperative findings in the lumbar spine with multilevel chronic compressions and multilevel bony foraminal impingement. Electronically Signed   By: Walter  Liebkemann M.D.   On: 04/13/2019 17:47   DG Abd 1 View  Result Date: 04/13/2019 CLINICAL DATA:  Nausea and vomiting for 2 days EXAM:  ABDOMEN - 1 VIEW COMPARISON:  04/02/2019 FINDINGS: Scattered gas pattern is noted. The stomach is distended. A paucity of distal colonic gas is seen which may be related to the underlying diverticulitis. No free air is seen. Postsurgical and degenerative changes of lumbar spine are noted. IMPRESSION: No acute abnormality seen. Electronically Signed   By: Mark  Lukens M.D.   On: 04/13/2019 09:04   US RENAL  Result Date: 04/13/2019 CLINICAL DATA:  Acute kidney injury. History of diabetes, chronic kidney disease, cirrhosis. EXAM: RENAL / URINARY TRACT ULTRASOUND COMPLETE COMPARISON:  Abdomen CT dated 04/07/2019. FINDINGS: Right Kidney: Renal measurements: 10.2 x 4.8 x 4.8 cm = volume: 123 mL. Echogenic cortex. No mass or hydronephrosis. Left Kidney: Renal measurements: 9 x 4 x 4.3 cm = volume: 83 mL. Echogenic cortex. Atrophic, corresponding to CT appearance. No mass or hydronephrosis. Bladder: Decompressed by Foley catheter. Other: None. IMPRESSION: 1. Both kidneys are echogenic indicating chronic medical renal disease. 2. Slight LEFT kidney is somewhat atrophic, as also described on recent CT abdomen report of 04/17/2019. 3. No acute findings.  No hydronephrosis. 4. Bladder decompressed by Foley catheter. Electronically Signed   By: Stan  Maynard M.D.   On: 04/13/2019 14:59   DG CHEST PORT 1 VIEW  Result Date: 04/13/2019 CLINICAL DATA:  Shortness of breath EXAM: PORTABLE CHEST 1 VIEW COMPARISON:  04/10/2019 FINDINGS: Cardiac shadow is stable. Aortic calcifications are again seen. The lungs are well aerated bilaterally. No focal infiltrate or sizable effusion is seen. Minimal atelectatic changes are noted in the left base. IMPRESSION: Mild left basilar atelectasis.  No acute abnormality noted. Electronically Signed   By: Mark  Lukens M.D.   On: 04/13/2019 09:00   DG Abd Portable 1V  Result Date: 04/13/2019 CLINICAL DATA:  Check gastric catheter placement EXAM: PORTABLE ABDOMEN - 1 VIEW COMPARISON:   04/13/2019 FINDINGS: Gastric catheter is now been placed in the distal stomach. The stomach is decompressed. There remains some gaseous dilation of the transverse colon. No other focal abnormality is noted. IMPRESSION: Gastric catheter within the   stomach. Electronically Signed   By: Inez Catalina M.D.   On: 04/13/2019 09:50   [place] EKG SITE RITE  Result Date: 04/13/2019 If Site Rite image not attached, placement could not be confirmed due to current cardiac rhythm.   LOS: 4 days   Time spent: More than 50% of that time was spent in counseling and/or coordination of care.  Antonieta Pert, MD [facility]  04/14/2019, 10:19 AM

## 2019-04-14 NOTE — Progress Notes (Signed)
Physical Therapy Treatment Patient Details Name: [name] MRN: [id number] DOB: [birth year] Today's Date: 04/14/2019    History of Present Illness 28 YOF with significant history of anemia/osteoarthritis, left breast cancer, cataracts, CKD IV, liver cirrhosis, T2DM on long-term insulin, GERD, hyperlipidemia, hypertension history of multiple falls in the past, nocturia, seasonal allergies, history of facial melanoma with surgical excision in 2012 comes to the ER with multiple episodes of nausea vomiting and diarrhea following her chemotherapy cycle this week    PT Comments    The patient is not in SDU with NG suction for presumed ileus. Patient may now require SNF for rehab prior to DC. Will continue to assess needs at DC. Continue mobil;ity.  Follow Up Recommendations  SNF vs. HHPT depends on progress.     Equipment Recommendations  Wheelchair (measurements PT);Wheelchair cushion (measurements PT)(if DC home)    Recommendations for Other Services       Precautions / Restrictions Precautions Precaution Comments: NG suction, very short-    Mobility  Bed Mobility   Bed Mobility: Rolling;Sidelying to Sit Rolling: Mod assist Sidelying to sit: Mod assist       General bed mobility comments: use of rais to roll, assist with trunk to sit up.Use of bed pad to scoot to bed edge.  Transfers Overall transfer level: Needs assistance Equipment used: Rolling walker (2 wheeled) Transfers: Sit to/from [facility] Sit to Stand: Max assist;+2 physical assistance;+2 safety/equipment Stand pivot transfers: Max assist;+2 physical assistance;+2 safety/equipment       General transfer comment: Max assist to stand up, trunkd flexed, required assist to turn enough to sit in front of recliner, quite weak and not standing erect.  Ambulation/Gait                 Stairs             Wheelchair Mobility    Modified Rankin (Stroke Patients Only)        Balance   Sitting-balance support: Bilateral upper extremity supported;Feet unsupported Sitting balance-Leahy Scale: Fair Sitting balance - Comments: sits without support   Standing balance support: Bilateral upper extremity supported;During functional activity Standing balance-Leahy Scale: Poor Standing balance comment: requires assistance                            Cognition Arousal/Alertness: Awake/alert Behavior During Therapy: WFL for tasks assessed/performed                                          Exercises      General Comments        Pertinent Vitals/Pain Pain Assessment: Faces Faces Pain Scale: Hurts little more Pain Location: mouth and general body Pain Descriptors / Indicators: Aching;Discomfort;Guarding Pain Intervention(s): Monitored during session;Limited activity within patient's tolerance    Home Living                      Prior Function            PT Goals (current goals can now be found in the care plan section) Progress towards PT goals: Progressing toward goals    Frequency           PT Plan Current plan remains appropriate;Discharge plan needs to be updated    Co-evaluation  AM-PAC PT "6 Clicks" Mobility   Outcome Measure  Help needed turning from your back to your side while in a flat bed without using bedrails?: A Lot Help needed moving from lying on your back to sitting on the side of a flat bed without using bedrails?: A Lot Help needed moving to and from a bed to a chair (including a wheelchair)?: A Lot Help needed standing up from a chair using your arms (e.g., wheelchair or bedside chair)?: Total Help needed to walk in hospital room?: Total Help needed climbing 3-5 steps with a railing? : Total 6 Click Score: 9    End of Session Equipment Utilized During Treatment: Oxygen Activity Tolerance: Patient limited by fatigue;Other (comment) Patient left: in chair;with call  bell/phone within reach Nurse Communication: Mobility status;Need for lift equipment(maxisky) PT Visit Diagnosis: Unsteadiness on feet (R26.81);Difficulty in walking, not elsewhere classified (R26.2);Pain     Time: 1020-1045 PT Time Calculation (min) (ACUTE ONLY): 25 min  Charges:  $Therapeutic Activity: 23-37 mins                     [name] PT Acute Rehabilitation Services Pager [phone] Office [phone]    [name] 04/14/2019, 1:13 PM

## 2019-04-14 NOTE — Progress Notes (Signed)
Inpatient Diabetes Program Recommendations  AACE/ADA: New Consensus Statement on Inpatient Glycemic Control (2015)  Target Ranges:  Prepandial:   less than 140 mg/dL      Peak postprandial:   less than 180 mg/dL (1-2 hours)      Critically ill patients:  140 - 180 mg/dL   Lab Results  Component Value Date   GLUCAP 132 (H) 04/14/2019   HGBA1C 11.6 (H) 03/31/2019    Spoke with pt at bedside regarding A1c 11.6% and glucose control at home. Pt reports she checks her glucose in the morning and at night. She reports just seeing her doctor and had changes made to her insulin less than 2 weeks ago. Informed pt of A1c level. Pt knew level and was not surprised.  Pt on chemotherapy and may need extra insulin dosing on chemotherapy days (steroids given often as premedication to prevent nausea).  Pt sees her PCP every week to control her glucose levels. Pt's daughter is helping pt and working closely with the PCP. No needs identified at this time.   Thanks,  Tama Headings RN, MSN, BC-ADM Inpatient Diabetes Coordinator Team Pager [phone] (8a-5p)

## 2019-04-15 ENCOUNTER — Inpatient Hospital Stay (HOSPITAL_COMMUNITY): Payer: Medicare HMO

## 2019-04-15 DIAGNOSIS — Z515 Encounter for palliative care: Secondary | ICD-10-CM

## 2019-04-15 DIAGNOSIS — R600 Localized edema: Secondary | ICD-10-CM

## 2019-04-15 DIAGNOSIS — R531 Weakness: Secondary | ICD-10-CM

## 2019-04-15 DIAGNOSIS — Z7189 Other specified counseling: Secondary | ICD-10-CM

## 2019-04-15 LAB — CBC
HCT: 40.8 % (ref 36.0–46.0)
Hemoglobin: 13.9 g/dL (ref 12.0–15.0)
MCH: 38.9 pg — ABNORMAL HIGH (ref 26.0–34.0)
MCHC: 34.1 g/dL (ref 30.0–36.0)
MCV: 114.3 fL — ABNORMAL HIGH (ref 80.0–100.0)
Platelets: 151 10*3/uL (ref 150–400)
RBC: 3.57 MIL/uL — ABNORMAL LOW (ref 3.87–5.11)
RDW: 19.9 % — ABNORMAL HIGH (ref 11.5–15.5)
WBC: 10.9 10*3/uL — ABNORMAL HIGH (ref 4.0–10.5)
nRBC: 5.8 % — ABNORMAL HIGH (ref 0.0–0.2)

## 2019-04-15 LAB — COMPREHENSIVE METABOLIC PANEL
ALT: 13 U/L (ref 0–44)
AST: 37 U/L (ref 15–41)
Albumin: 1.8 g/dL — ABNORMAL LOW (ref 3.5–5.0)
Alkaline Phosphatase: 58 U/L (ref 38–126)
Anion gap: 13 (ref 5–15)
BUN: 88 mg/dL — ABNORMAL HIGH (ref 8–23)
CO2: 15 mmol/L — ABNORMAL LOW (ref 22–32)
Calcium: 8.2 mg/dL — ABNORMAL LOW (ref 8.9–10.3)
Chloride: 110 mmol/L (ref 98–111)
Creatinine, Ser: 4.25 mg/dL — ABNORMAL HIGH (ref 0.44–1.00)
GFR calc Af Amer: 10 mL/min — ABNORMAL LOW (ref >60)
GFR calc non Af Amer: 9 mL/min — ABNORMAL LOW (ref >60)
Glucose, Bld: 168 mg/dL — ABNORMAL HIGH (ref 70–99)
Potassium: 4.4 mmol/L (ref 3.5–5.1)
Sodium: 138 mmol/L (ref 135–145)
Total Bilirubin: 2 mg/dL — ABNORMAL HIGH (ref 0.3–1.2)
Total Protein: 3.8 g/dL — ABNORMAL LOW (ref 6.5–8.1)

## 2019-04-15 LAB — GLUCOSE, CAPILLARY
Glucose-Capillary: 157 mg/dL — ABNORMAL HIGH (ref 70–99)
Glucose-Capillary: 140 mg/dL — ABNORMAL HIGH (ref 70–99)
Glucose-Capillary: 131 mg/dL — ABNORMAL HIGH (ref 70–99)
Glucose-Capillary: 105 mg/dL — ABNORMAL HIGH (ref 70–99)

## 2019-04-15 LAB — ECHOCARDIOGRAM COMPLETE
Height: 60 in
Weight: 3400.38 oz

## 2019-04-15 MED ORDER — FUROSEMIDE 10 MG/ML IJ SOLN
40.0000 mg | Freq: Once | INTRAMUSCULAR | Status: AC
  Administered 2019-04-15: 14:00:00 40 mg via INTRAVENOUS
  Filled 2019-04-15: qty 4

## 2019-04-15 MED ORDER — FLUCONAZOLE 100MG IVPB
50.0000 mg | INTRAVENOUS | Status: DC
  Administered 2019-04-15: 17:00:00 50 mg via INTRAVENOUS
  Filled 2019-04-15 (×5): qty 25

## 2019-04-15 MED ORDER — SODIUM CHLORIDE 0.9 % IV BOLUS: 250.0000 mL | Freq: Once | INTRAVENOUS | Status: DC

## 2019-04-15 MED ORDER — PIPERACILLIN-TAZOBACTAM IN DEX 2-0.25 GM/50ML IV SOLN
2.2500 g | Freq: Three times a day (TID) | INTRAVENOUS | Status: DC
  Administered 2019-04-15 – 2019-04-16 (×4): 2.25 g via INTRAVENOUS
  Filled 2019-04-15 (×6): qty 50

## 2019-04-15 MED ORDER — PERFLUTREN LIPID MICROSPHERE
1.0000 mL | INTRAVENOUS | Status: AC | PRN
  Administered 2019-04-15: 2 mL via INTRAVENOUS
  Filled 2019-04-15: qty 10

## 2019-04-15 MED ORDER — DEXTROSE 5 % IV SOLN
400.0000 mg | INTRAVENOUS | Status: DC
  Administered 2019-04-16: 04:00:00 400 mg via INTRAVENOUS
  Filled 2019-04-15: qty 8

## 2019-04-15 MED ORDER — LIP MEDEX EX OINT
TOPICAL_OINTMENT | CUTANEOUS | Status: AC
  Filled 2019-04-15: qty 7

## 2019-04-15 NOTE — Progress Notes (Signed)
  Echocardiogram 2D Echocardiogram has been performed.  [name] 04/15/2019, 3:17 PM

## 2019-04-15 NOTE — Progress Notes (Signed)
[facility] Progress Note     Subjective: CC-  Thinks that she is feeling a little better. Less abdominal pain. Denies n/v. Thinks that she is passing a little flatus. No BM. NG tube with 650cc light bilious output over the last 24 hours.  ROS: See above, otherwise other systems negative   Objective: Vital signs in last 24 hours: Temp:  [97.5 F (36.4 C)-99.1 F (37.3 C)] 98.9 F (37.2 C) (02/16 0800) Pulse Rate:  [101-108] 104 (02/16 0400) Resp:  [12-24] 12 (02/16 0400) BP: (89-129)/(42-70) 89/42 (02/16 0400) SpO2:  [88 %-98 %] 94 % (02/16 0845) Weight:  [96.4 kg] 96.4 kg (02/16 0500) Last BM Date: 04/11/19  Intake/Output from previous day: 02/15 0701 - 02/16 0700 In: 482.2 [IV Piggyback:482.2] Out: 1150 [Urine:650; Emesis/NG output:500] Intake/Output this shift: No intake/output data recorded.  PE: Gen:  Alert, NAD, pleasant Pulm: rate and effort normal Abd: Soft, mild distension, few BS heard, nontender Skin: no rashes noted, warm and dry  Lab Results:  Recent Labs    04/14/19 0544 04/15/19 0536  WBC 13.1* 10.9*  HGB 14.2 13.9  HCT 41.8 40.8  PLT 169 151   BMET Recent Labs    04/14/19 0544 04/15/19 0536  NA 138 138  K 4.3 4.4  CL 110 110  CO2 18* 15*  GLUCOSE 139* 168*  BUN 77* 88*  CREATININE 3.37* 4.25*  CALCIUM 8.3* 8.2*   PT/INR No results for input(s): LABPROT, INR in the last 72 hours. CMP     Component Value Date/Time   NA 138 04/15/2019 0536   K 4.4 04/15/2019 0536   CL 110 04/15/2019 0536   CO2 15 (L) 04/15/2019 0536   GLUCOSE 168 (H) 04/15/2019 0536   BUN 88 (H) 04/15/2019 0536   CREATININE 4.25 (H) 04/15/2019 0536   CREATININE 2.18 (H) 10/08/2018 1146   CALCIUM 8.2 (L) 04/15/2019 0536   PROT 3.8 (L) 04/15/2019 0536   ALBUMIN 1.8 (L) 04/15/2019 0536   AST 37 04/15/2019 0536   AST 27 10/08/2018 1146   ALT 13 04/15/2019 0536   ALT 15 10/08/2018 1146   ALKPHOS 58 04/15/2019 0536   BILITOT 2.0 (H) 04/15/2019 0536    BILITOT 1.0 10/08/2018 1146   GFRNONAA 9 (L) 04/15/2019 0536   GFRNONAA 20 (L) 10/08/2018 1146   GFRAA 10 (L) 04/15/2019 0536   GFRAA 24 (L) 10/08/2018 1146   Lipase     Component Value Date/Time   LIPASE 27 04/07/2019 1035       Studies/Results: CT ABDOMEN PELVIS WO CONTRAST  Result Date: 04/13/2019 CLINICAL DATA:  Diverticulitis. Nausea and vomiting for 5 days. History of breast cancer. EXAM: CT ABDOMEN AND PELVIS WITHOUT CONTRAST TECHNIQUE: Multidetector CT imaging of the abdomen and pelvis was performed following the standard protocol without IV contrast. COMPARISON:  03/31/2019 FINDINGS: Lower chest: New small bilateral pleural effusions with passive atelectasis. Stable coronary atherosclerosis. Feeding tube extends through the distal esophagus into the stomach body. Mild atelectasis in the lingula. Hepatobiliary: Hepatic cirrhosis with nodular contour. Gallbladder surgically absent. Pancreas: Unremarkable Spleen: Stable hypodense 1.7 by 1.2 cm anterior splenic lesion on image 23/3, no change from the recent prior exam. Adrenals/Urinary Tract: Foley catheter in the empty urinary bladder. Left renal atrophy. Adrenal glands unremarkable. Stomach/Bowel: Periampullary duodenal diverticulum. Sigmoid colon diverticulosis. Mild residual stranding along the sigmoid colon although improved from prior, compatible with improving diverticulitis. Vascular/Lymphatic: Aortoiliac atherosclerotic vascular disease. Portosystemic collaterals along the retroperitoneum. Reproductive: Uterus absent. Adnexa unremarkable. Other:  Moderate ascites, new compared to the prior exam. Increased flank edema along the lateral abdominal wall musculature and hips. Stable low-level edema at the root of the mesentery. Musculoskeletal: Stable fusions from L1 through L5 with posterolateral rod and pedicle screw fixation on the left at L2-L3-L4 and with a separate left rod at L5-S1; and on the right at L2-3 and separately at  L4-L5-S1. Chronic compressions at all levels between T12 and L4 inclusive. Posterior decompression of much of the lumbar spine. Osseous foraminal narrowing on the right at T12-L1, L2-3, and L3-4; and on the left at T12-L1, L4-5, and L5-S1. No appreciable lucency around the pedicle screws. No new fracture. IMPRESSION: 1. New moderate ascites. New small bilateral pleural effusions and increased edema along the flanks and hips in the subcutaneous tissues, compatible with accentuated third spacing of fluid. 2. Improved sigmoid diverticulitis, nearly resolved. 3. Hepatic cirrhosis with portal venous hypertension. 4. Stable hypodense lesion in the anterior spleen. 5. Other imaging findings of potential clinical significance: Coronary atherosclerosis. Left renal atrophy. Periampullary duodenal diverticulum. Stable postoperative findings in the lumbar spine with multilevel chronic compressions and multilevel bony foraminal impingement. Electronically Signed   By: Van Clines M.D.   On: 04/13/2019 17:47   US RENAL  Result Date: 04/13/2019 CLINICAL DATA:  Acute kidney injury. History of diabetes, chronic kidney disease, cirrhosis. EXAM: RENAL / URINARY TRACT ULTRASOUND COMPLETE COMPARISON:  Abdomen CT dated 04/07/2019. FINDINGS: Right Kidney: Renal measurements: 10.2 x 4.8 x 4.8 cm = volume: 123 mL. Echogenic cortex. No mass or hydronephrosis. Left Kidney: Renal measurements: 9 x 4 x 4.3 cm = volume: 83 mL. Echogenic cortex. Atrophic, corresponding to CT appearance. No mass or hydronephrosis. Bladder: Decompressed by Foley catheter. Other: None. IMPRESSION: 1. Both kidneys are echogenic indicating chronic medical renal disease. 2. Slight LEFT kidney is somewhat atrophic, as also described on recent CT abdomen report of 04/11/2019. 3. No acute findings.  No hydronephrosis. 4. Bladder decompressed by Foley catheter. Electronically Signed   By: Franki Cabot M.D.   On: 04/13/2019 14:59   DG Abd Portable 1V  Result  Date: 04/15/2019 CLINICAL DATA:  Abdominal distention EXAM: PORTABLE ABDOMEN - 1 VIEW COMPARISON:  04/13/2019 FINDINGS: NG tube remains in the stomach. Mild gaseous distention of the right colon and transverse colon, similar to prior study. No organomegaly or free air. IMPRESSION: Continued mild gaseous distention of the right colon and transverse colon, favor ileus. NG tube in the stomach. Electronically Signed   By: Rolm Baptise M.D.   On: 04/15/2019 08:55   [place] EKG SITE RITE  Result Date: 04/13/2019 If Site Rite image not attached, placement could not be confirmed due to current cardiac rhythm.   Anti-infectives: Anti-infectives (From admission, onward)   Start     Dose/Rate Route Frequency Ordered Stop   04/15/19 1500  fluconazole (DIFLUCAN) IVPB 50 mg     50 mg 25 mL/hr over 60 Minutes Intravenous Every 24 hours 04/15/19 0654     04/15/19 1400  piperacillin-tazobactam (ZOSYN) IVPB 2.25 g     2.25 g 100 mL/hr over 30 Minutes Intravenous Every 8 hours 04/15/19 0654     04/14/19 1600  acyclovir (ZOVIRAX) 400 mg in dextrose 5 % 100 mL IVPB     400 mg 108 mL/hr over 60 Minutes Intravenous Every 12 hours 04/14/19 1409     04/14/19 1500  fluconazole (DIFLUCAN) IVPB 100 mg  Status:  Discontinued     100 mg 50 mL/hr over 60 Minutes Intravenous  Every 24 hours 04/14/19 1409 04/15/19 0654   04/13/19 1200  piperacillin-tazobactam (ZOSYN) IVPB 2.25 g  Status:  Discontinued     2.25 g 100 mL/hr over 30 Minutes Intravenous Every 6 hours 04/13/19 0951 04/15/19 0654   04/10/19 1200  ciprofloxacin (CIPRO) IVPB 400 mg  Status:  Discontinued     400 mg 200 mL/hr over 60 Minutes Intravenous Every 24 hours 04/01/2019 1530 04/13/19 0931   04/10/19 0000  ciprofloxacin (CIPRO) IVPB 400 mg  Status:  Discontinued     400 mg 200 mL/hr over 60 Minutes Intravenous Every 12 hours 04/25/2019 1523 04/21/2019 1530   04/27/2019 2200  metroNIDAZOLE (FLAGYL) IVPB 500 mg  Status:  Discontinued     500 mg 100 mL/hr over 60  Minutes Intravenous Every 8 hours 04/04/2019 1523 04/13/19 0931   04/12/2019 2200  valACYclovir (VALTREX) tablet 500 mg  Status:  Discontinued     500 mg Oral 2 times daily 04/07/2019 1740 04/14/19 1409   04/27/2019 1830  fluconazole (DIFLUCAN) tablet 200 mg  Status:  Discontinued     200 mg Oral Daily 04/11/2019 1740 04/14/19 1409   04/13/2019 1115  ciprofloxacin (CIPRO) IVPB 400 mg     400 mg 200 mL/hr over 60 Minutes Intravenous  Once 04/11/2019 1101 04/06/2019 1339   04/22/2019 1115  metroNIDAZOLE (FLAGYL) IVPB 500 mg     500 mg 100 mL/hr over 60 Minutes Intravenous  Once 04/16/2019 1101 04/08/2019 1339       Assessment/Plan AKI on CKD IV DM2  HLD HTN GERD Obesity Cirrhosis with portal HTN Recurrent breast cancer Sigmoid diverticulitis: improving  Ileus - No sign of bowel obstruction on CT from 2/14 - Xray shows mild distension of the right colon and transverse colon - May be passing a little flatus, NG tube output trending down but still light bilious. Continue NPO/NGT to LIWS and await return in bowel function.  FEN - IVF, NPO/NGT to LIWS VTE - SCDs, sq heparin Foley - none Follow up - TBD    LOS: 5 days    Wellington Hampshire, [facility] 04/15/2019, 9:55 AM Please see Amion for pager number during day hours 7:00am-4:30pm

## 2019-04-15 NOTE — Consult Note (Signed)
Consultation Note Date: 04/15/2019   Patient Name: [name]  DOB: 1933-06-24  MRN: [id number]  Age / Sex: 86 y.o., female  PCP: Celene Squibb, MD Referring Physician: Antonieta Pert, MD  Reason for Consultation: Establishing goals of care  HPI/Patient Profile: 86 y.o. female  with past medical history of   breast cancer admitted on 04/19/2019 with diverticulitis, acute kidney injury.   Clinical Assessment and Goals of Care: 86 year old lady who lives at home with her daughter.  Past medical history of breast cancer, recurrent breast cancer.  Patient's active issues in this hospitalization include sigmoid diverticulitis and acute kidney injury.  Has underlying cirrhosis with portal hypertension, stage IV chronic kidney disease diabetes dyslipidemia hypertension and GERD.    In this hospitalization, patient is being followed by general surgery, oncology, nephrology.  She remains admitted to hospital medicine service.  Palliative consultation has been requested for goals of care conversations in light of patient's ongoing volume overload as well as worsening renal function.  Patient is awake reasonably alert resting in bed.  She is able to ask for ice chips she is able to answer several questions appropriately.  Her daughter [name] is present at the bedside.  Palliative medicine is specialized medical care for people living with serious illness. It focuses on providing relief from the symptoms and stress of a serious illness. The goal is to improve quality of life for both the patient and the family.  Goals of care: Broad aims of medical therapy in relation to the patient's values and preferences. Our aim is to provide medical care aimed at enabling patients to achieve the goals that matter most to them, given the circumstances of their particular medical situation and their constraints.   Goals wishes and  values important to the patient and daughter attempted to be explored.  Discussed extensively about issues pertaining to current hospitalization.  Discussed about next steps with regards to this hospitalization as well as broad goals of care.  Additionally, extensive discussions were also held outside the patient's room with patient's daughter [name].  HCPOA Daughter [name].  Patient's son is deceased, patient's husband is deceased.  Patient and daughter live together in [place], [place].  SUMMARY OF RECOMMENDATIONS   Agree with DO NOT RESUSCITATE Time-limited trial of current interventions for the next 24-48 hours.  Patient and daughter are hopeful for some degree of stabilization/recovery.  They appreciate honest and forthright information and they are appreciative of the efforts being made for the patient to get better.  They realize she is not a dialysis candidate. If the patient has escalating symptom burden along with worsening renal indices, patient's daughter wishes to shift focus to comfort measures and avoiding suffering.  At that time, patient will benefit from hospice support.  For now, continue current mode of care and monitor.  Code Status/Advance Care Planning:  DNR    Symptom Management:      Palliative Prophylaxis:   Delirium Protocol     Psycho-social/Spiritual:   Desire for further Chaplaincy  support:yes  Additional Recommendations: Education on Hospice  Prognosis:   Unable to determine  Discharge Planning: To Be Determined      Primary Diagnoses: Present on Admission: . Sigmoid diverticulitis . HTN (hypertension) . Hyperlipidemia . Chronic kidney disease . GERD (gastroesophageal reflux disease) . Recurrent breast adenocarcinoma, left ([place]) . Cirrhosis ([place]) . Diverticulitis of sigmoid colon   I have reviewed the medical record, interviewed the patient and family, and examined the patient. The following aspects are  pertinent.  Past Medical History:  Diagnosis Date  . Abrasion of arm, left    secondary to fall   . Anemia   . Arthritis   . Breast cancer, left ([place])    er/pr +  . Cataract    no surgery as yet,starting of 3 years  . Chronic kidney disease    renal stones, one episode of Lithotripsy  . Cirrhosis ([facility])    Metavir score 4  . Complication of anesthesia   . Diabetes mellitus   . GERD (gastroesophageal reflux disease)   . Hyperlipidemia   . Hypertension    followed by  grp. relative to  urology study grp.  Doesn't report ever having a stress test   . Mental disorder   . Multiple falls   . Nocturia   . PONV (postoperative nausea and vomiting)   . Seasonal allergies   . Skin cancer    face- melanoma, 2012- surg. excision     Social History   Socioeconomic History  . Marital status: Widowed    Spouse name: Not on file  . Number of children: Not on file  . Years of education: Not on file  . Highest education level: Not on file  Occupational History  . 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 Smoking status: Never Smoker  . Smokeless tobacco: Never Used  Substance and Sexual Activity  . Alcohol use: No  . Drug use: No  . Sexual activity: Not Currently    Comment: GX p2,  1st live birth 51  Other Topics Concern  . Not on file  Social History Narrative  . Not on file   Social Determinants of Health   Financial Resource Strain:   . Difficulty of Paying Living Expenses: Not on file  Food Insecurity:   . Worried About Charity fundraiser in the Last Year: Not on file  . Ran Out of Food in the Last Year: Not on file  Transportation Needs:   . Lack of Transportation (Medical): Not on file  . Lack of Transportation (Non-Medical): Not on file  Physical Activity:   . Days of Exercise per Week: Not on file  . Minutes of Exercise per Session: Not on file  Stress:   . Feeling of Stress : Not on file   Social Connections:   . Frequency of Communication with Friends and Family: Not on file  . Frequency of Social Gatherings with Friends and Family: Not on file  . Attends Religious Services: Not on file  . Active Member of Clubs or Organizations: Not on file  . Attends Archivist Meetings: Not on file  . Marital Status: Not on file   Family History  Problem Relation Age of Onset  . Dementia Mother   . Heart disease Father   . Emphysema Father   . Cancer Brother        metastatic, unsure primary  . Stroke  Sister   . CAD Brother   . Colon cancer Neg Hx   . Prostate cancer Neg Hx    Scheduled Meds: . aspirin EC  81 mg Oral Daily  . brimonidine  1 drop Both Eyes BID  . budesonide  2 mL Inhalation BID  . Chlorhexidine Gluconate Cloth  6 each Topical Daily  . dorzolamide  1 drop Both Eyes BID  . furosemide  40 mg Intravenous Once  . furosemide  60 mg Intravenous BID  . heparin  5,000 Units Subcutaneous Q8H  . insulin aspart  0-15 Units Subcutaneous Q6H  . insulin glargine  10 Units Subcutaneous Daily  . ipratropium-albuterol  3 mL Nebulization TID  . latanoprost  1 drop Both Eyes QHS  . magic mouthwash w/lidocaine  5 mL Oral QID  . mouth rinse  15 mL Mouth Rinse BID  . mirabegron ER  50 mg Oral Daily  . mirtazapine  15 mg Oral QHS  . pantoprazole (PROTONIX) IV  40 mg Intravenous Q24H  . sodium chloride flush  10-40 mL Intracatheter Q12H   Continuous Infusions: . [START ON 04/16/2019] acyclovir    . fluconazole (DIFLUCAN) IV    . piperacillin-tazobactam (ZOSYN)  IV     PRN Meds:.hydrALAZINE, LORazepam, menthol-cetylpyridinium, ondansetron **OR** ondansetron (ZOFRAN) IV, polyvinyl alcohol, promethazine, sodium chloride flush, traMADol Medications Prior to Admission:  Prior to Admission medications   Medication Sig Start Date End Date Taking? Authorizing Provider  ALPHAGAN P 0.1 % SOLN Place 1 drop into both eyes 2 (two) times daily.  08/01/16  Yes [provider]  amLODipine (NORVASC) 5 MG tablet Take 5 mg by mouth every morning.    Yes [provider]  aspirin EC 81 MG tablet Take 81 mg by mouth daily.   Yes [provider]  atorvastatin (LIPITOR) 10 MG tablet Take 10 mg by mouth every evening.    Yes [provider]  Bacillus Coagulans-Inulin (PROBIOTIC FORMULA PO) Take 1 capsule by mouth daily.   Yes [provider]  capecitabine (XELODA) 500 MG tablet Take 3 tablets in AM, 2 tablets in PM, Take for 14 days, then hold for 7 days. Repeat every 21 days. Patient taking differently: Take 1,000-1,500 mg by mouth See admin instructions. Take 3 tablets in AM, 2 tablets in PM, Take for 14 days, then hold for 14 days days, then determine if they will repeat. 03/18/19  Yes Magrinat, Virgie Dad, MD  cyanocobalamin (,VITAMIN B-12,) 1000 MCG/ML injection Inject 1,000 mcg into the muscle every 30 (thirty) days.  05/14/17  Yes [provider]  dorzolamide (TRUSOPT) 2 % ophthalmic solution Place 1 drop into both eyes 2 (two) times daily.    Yes [provider]  EPIPEN 2-PAK 0.3 MG/0.3ML SOAJ injection Inject 0.3 mg into the muscle as needed for anaphylaxis.    Yes [provider]  FLOVENT HFA 110 MCG/ACT inhaler Inhale 1 puff into the lungs 2 (two) times daily.  02/10/19  Yes [provider]  gabapentin (NEURONTIN) 600 MG tablet Take 600 mg by mouth daily.    Yes [provider]  glipiZIDE (GLUCOTROL) 5 MG tablet Take 5 mg by mouth daily before breakfast.  07/31/16  Yes [provider]  ipratropium-albuterol (DUONEB) 0.5-2.5 (3) MG/3ML SOLN Take 3 mLs by nebulization 3 (three) times daily. 04/23/18  Yes Sinda Du, MD  latanoprost (XALATAN) 0.005 % ophthalmic solution Place 1 drop into both eyes at bedtime.  01/17/18  Yes [provider]  LORazepam (ATIVAN) 0.5 MG tablet Take 0.5 mg by mouth 2 (two) times daily.   Yes [provider]  magic mouthwash  w/lidocaine SOLN Take 5 mLs by mouth 4 (four) times daily as needed for mouth pain.   Yes [provider]  metoprolol succinate (TOPROL-XL) 100 MG 24 hr tablet Take 100 mg by mouth every morning. Take with or immediately following a meal.   Yes [provider]  mirtazapine (REMERON) 15 MG tablet Take 15 mg by mouth at bedtime.  08/04/15  Yes [provider]  MYRBETRIQ 50 MG TB24 tablet Take 50 mg by mouth daily.  01/15/18  Yes [provider]  NOVOLOG FLEXPEN 100 UNIT/ML FlexPen Inject 0-18 Units into the skin See admin instructions. 0-18 units sliding scale breakfast, lunch, dinner 14 units at bedtime if sugar is over 400 03/26/19  Yes [provider]  Polyethyl Glycol-Propyl Glycol (SYSTANE ULTRA) 0.4-0.3 % SOLN Place 1-2 drops into both eyes daily as needed (for itching eyes).   Yes [provider]  torsemide (DEMADEX) 20 MG tablet Take 1 tablet (20 mg total) by mouth daily. 08/21/17  Yes Sinda Du, MD  traMADol (ULTRAM) 50 MG tablet Take 50 mg by mouth every 6 (six) hours as needed for moderate pain.  03/13/19  Yes [provider]  TRESIBA FLEXTOUCH 100 UNIT/ML SOPN FlexTouch Pen Inject 0.5 mLs (50 Units total) into the skin daily. 04/23/18  Yes Sinda Du, MD  BD PEN NEEDLE NANO U/F 32G X 4 MM MISC  06/21/18   [provider]  cholestyramine (QUESTRAN) 4 g packet Take 1 packet (4 g total) by mouth 2 (two) times daily. 04/08/19   Gardenia Phlegm, NP  fluconazole (DIFLUCAN) 200 MG tablet Take 1 tablet (200 mg total) by mouth daily. 04/08/19   Gardenia Phlegm, NP  Lancets (ONETOUCH DELICA PLUS [id number]) [place]  06/18/18   [provider]  [facility] ULTRA test strip  08/23/18   [provider]  valACYclovir (VALTREX) 500 MG tablet Take 1 tablet (500 mg total) by mouth 2 (two) times daily. 04/08/19   Gardenia Phlegm, NP   Allergies  Allergen Reactions  . Bee Venom Anaphylaxis and Swelling   . Hydromorphone Hcl Other (See Comments)    Agitation, Confusion  . Demerol Nausea Only   Review of Systems Generalized weakness Physical Exam Elderly lady resting in chair Awake reasonably alert answers appropriately NG tube present, draining Abdomen mildly distended Generalized edema everywhere upper extremities, lower extremities Abdomen mildly distended S1-S2 Monitor noted, blood pressure currently 120/58  Vital Signs: BP (!) 121/58   Pulse (!) 111   Temp 100.2 F (37.9 C) (Axillary)   Resp (!) 22   Ht 5' (1.524 m)   Wt 96.4 kg   SpO2 (!) 88%   BMI 41.51 kg/m  Pain Scale: 0-10 POSS *See Group Information*: 1-Acceptable,Awake and alert Pain Score: 0-No pain   SpO2: SpO2: (!) 88 % O2 Device:SpO2: (!) 88 % O2 Flow Rate: .O2 Flow Rate (L/min): 4 L/min  IO: Intake/output summary:   Intake/Output Summary (Last 24 hours) at 04/15/2019 1356 Last data filed at 04/15/2019 0557 Gross per 24 hour  Intake 482.23 ml  Output 1150 ml  Net -667.77 ml    LBM: Last BM Date: 04/11/19 Baseline Weight: Weight: 92.3 kg Most recent weight: Weight: 96.4 kg     Palliative Assessment/Data:   Palliative performance scale 30-40  Time In:  12 Time Out:  1310 Time Total:  70 Greater than 50%  of this time was spent counseling and coordinating care related to the above assessment and plan.  Signed by: Loistine Chance, MD   Please contact Palliative Medicine Team phone at [phone] for questions and concerns.  For individual provider: See Shea Evans

## 2019-04-15 NOTE — Progress Notes (Addendum)
Pharmacy Antibiotic Note  [name] is a 86 y.o. female admitted on 04/11/2019 with IAI.  Pharmacy was consulted for zosyn dosing. Escalated cipro/flagyl to zosyn for IAI on 2/14, made NPO, Fluconazole and Valtrex not charted 2/14 & 2/15 for NPO status. Changed Fluconazole to IV, Valtrex to Acyclovir IV.  Plan: Zosyn 2.25 mg IV q8h for CrCl ~ 10 ml/min  Height: 5' (152.4 cm) Weight: 212 lb 8.4 oz (96.4 kg) IBW/kg (Calculated) : 45.5  Temp (24hrs), Avg:98.5 F (36.9 C), Min:97.5 F (36.4 C), Max:99.1 F (37.3 C)  Recent Labs  Lab 04/11/19 0729 04/11/19 0729 04/12/19 0439 04/13/19 0536 04/13/19 0822 04/13/19 1252 04/13/19 1800 04/14/19 0544 04/15/19 0536  WBC 11.1*  --  10.3 11.1*  --   --   --  13.1* 10.9*  CREATININE 2.28*   < > 2.21* 2.64*  --  2.95*  --  3.37* 4.25*  LATICACIDVEN  --   --   --   --  2.9* 3.8* 3.2*  --   --    < > = values in this interval not displayed.    Estimated Creatinine Clearance: 10.4 mL/min (A) (by C-G formula based on SCr of 4.25 mg/dL (H)).    Allergies  Allergen Reactions  . Bee Venom Anaphylaxis and Swelling  . Hydromorphone Hcl Other (See Comments)    Agitation, Confusion  . Demerol Nausea Only    Antimicrobials this admission: 2/10 Cipro>>2/14 2/10 Fluconazole>> missed 2/14 2/10 flagyl >>2/14 2/10 valacyclovir>> 2/13, missed 2/14 dose 2/15 Acyclovir IV >> 2/14 Zosyn>>  Dose adjustments this admission: 2/16: Zosyn 2.25gm q6 reduced to q8hr 2/16: Fluconazole 100mg  q24 reduced to 50mg  q24 2/16: Acyclovir to 400mg  q24 (equiv to 200mg  q12)  Microbiology results: 2/10 covid/flu neg 2/10 MRSA PCR neg 2/11 C diff neg  Thank you for allowing pharmacy to be a part of this patient's care.  Minda Ditto PharmD 04/15/2019, 11:22 AM

## 2019-04-15 NOTE — Plan of Care (Signed)
  Problem: Activity: Goal: Risk for activity intolerance will decrease Outcome: Progressing   Problem: Nutrition: Goal: Adequate nutrition will be maintained Outcome: Progressing  PT continues to be NPO with NG tube to LIWS   Problem: Elimination: Goal: Will not experience complications related to bowel motility Outcome: Progressing Hypoactive bowel sounds heard upon assessment Goal: Will not experience complications related to urinary retention Outcome: Progressing Foley cath remains in place   Problem: Safety: Goal: Ability to remain free from injury will improve Outcome: Progressing

## 2019-04-15 NOTE — Progress Notes (Signed)
[facility] Progress Note  Name: [name] MRN: [id number] DOB: 03/09/1933  Subjective: Seen by palliative today--> pt is DNR, daughter and pt are discussing funeral arrangements.  Cr up again today.  Intake/Output Summary (Last 24 hours) at 04/15/2019 1535 Last data filed at 04/15/2019 0557 Gross per 24 hour  Intake 384.9 ml  Output 1150 ml  Net -765.1 ml    Vitals:  Vitals:   04/15/19 1215 04/15/19 1218 04/15/19 1310 04/15/19 1500  BP:   (!) 121/58   Pulse: (!) 109 (!) 111 (!) 111 (!) 108  Resp: 20 20 (!) 22 20  Temp:      TempSrc:      SpO2: 92% 90% (!) 88% 95%  Weight:      Height:         Physical Exam:  General elderly female in bed in no acute distress HEENT areas of scabbing from skin cancer cryoablation Neck supple trachea midline Lungs clear to auscultation bilaterally but reduced breath sounds bases; no overt crackles; unlabored at rest on 3 liters oxygen Heart regular rate and rhythm no rubs or gallops appreciated Abdomen soft nontender distended/obese habitus Extremities 2+ + anasarca Psych normal mood and affect Neuro alert and oriented x 3; hard of hearing follows commands, mild asterixis today GU - foley in place    Medications reviewed     Labs:  BMP Latest Ref Rng & Units 04/15/2019 04/14/2019 04/13/2019  Glucose 70 - 99 mg/dL 168(H) 139(H) 214(H)  BUN 8 - 23 mg/dL 88(H) 77(H) 68(H)  Creatinine 0.44 - 1.00 mg/dL 4.25(H) 3.37(H) 2.95(H)  Sodium 135 - 145 mmol/L 138 138 132(L)  Potassium 3.5 - 5.1 mmol/L 4.4 4.3 4.4  Chloride 98 - 111 mmol/L 110 110 107  CO2 22 - 32 mmol/L 15(L) 18(L) 14(L)  Calcium 8.9 - 10.3 mg/dL 8.2(L) 8.3(L) 8.0(L)     Assessment/Plan:   # AKI - prerenal insults  - continue supportive renal measures  - will hold PM diuresis today- she's not a dialysis candidate - palliative following, appreciate assistance  # CKD stage IV  - baseline Cr 1.9- 2.4 in march- dec 2020, eGFR 17- 23.  - Pt admitted for  N/V, diarrhea due to chemoRx , possibly other GI causes. In hosp x 4-5 days, now has NG tube in for distended stomach.  Has been getting IVF's but I/O's are not complete. Up 20 lbs since admission as of the consult.  No shock, no nephrotoxins. UA neg, US shows chronic changes.  Is getting IV abx.  Overloaded--> albumin 1.8, suspect significant 3rd spacing.  # Acute hypoxic respiratory failure - on supplemental oxygen  - gentle diuresis as ordered   # Fluid vol overload - as above  # N/V, diarrhea - has NG in now  # Breast Ca - on xeloda  # DM 2 - per primary team   # Hyponatremia - hypervolemic and improving   # Cirrhosis w/ portal HTN - noted.    # Dispo: palliative following--> I suspect that she will not improve renal-wise.  Pt and dtr discussing funeral arrangements--they seem to be discussing comfort measure switch    Madelon Lips, MD 04/15/2019 3:35 PM

## 2019-04-15 NOTE — Progress Notes (Signed)
PROGRESS NOTE    [name]  CNO:[id number] DOB: 1933/10/24 DOA: 04/03/2019 PCP: Celene Squibb, MD   Brief Narrative: 77 YOF with significant history of anemia/osteoarthritis, left breast cancer, cataracts, CKD IV, liver cirrhosis, T2DM on long-term insulin, GERD,hyperlipidemia, hypertension history of multiple falls in the past, nocturia, seasonal allergies, history of facial melanoma with surgical excision in 2012 comes to the ER with multiple episodes of nausea vomiting and diarrhea following her chemotherapy cycle this week without melena hematochezia dysuria but with low-grade fever since Saturday, decreased oral intake for several days. In the ED vitals are stable UA CBC and CMP shows metabolic acidosis/CKD chronic.  LFTs lipase normal COVID-19) [place] negative, underwent chest x-ray no acute finding, and CT abdomen without contrast showed sigmoid diverticulitis, liver cirrhosis.Patient was placed on IV antibiotic and was admitted on liquid diet and seen by her oncology.C diff negative.  2/14-no more diarrhea, but now having nasuea and also vomited x2, also needed nasal cannula oxygen. NGT placed with improvement in symptoms, blood pressure soft and no urine output given IV fluids Foley placed, had AKI on CKD nephro/surgery was consulted. Moved to SDU.  Subjective:  No new complaints. No acute events overnight. Rt arm with PICC line, left side breast cancer hx- RN checking BP on leg only and it is in 89-90s : not sure how accurate. Will have RN check on rt forearm x1 to asses. BP soft Lasix held this am.Current oxygen is at 3 L. Urine output 650 mL past 24 hours. NG output 500 ml. X-ray abdomen pending. She is afebrile overnight and WBC improved to 10.9K.  Assessment & Plan:  Nausea vomiting diarrhea with Sigmoid diverticulitis, also contributed by chemotherapy/presumed ilues: diarrhea had improved-but persistent nauseous and vomiting and had no BM- xray with dilated stomach,  Transverse colon-suspected ileus-NG tube placed 2/14 and seen by surgery.Continue NG tube suction as per surgery remains n.p.o.  Repeat CT abdomen pelvis 2/14 shows improved diverticulitis no bowel obstruction.  Patient on Zosyn pharmacy dosing.Wbc downtrending.  Repeat x-ray this am with mild gaseous distention of the right colon and transverse colon favor ileus.  Continue NG tube plan as per surgery  AKI on CKD stage IV/left renal atrophy/metabolic acidosis: Suspect multifactorial with nausea vomiting/diverticulitis  And now with ileus. Baseline creatinine 2.1-2.5. s/p ivf and bicarb due to low urine output on 2/14-also had Foley placed and renal ultrasound obtained- had good urine output and nephro was consulted  and placed on lasix, stopped ivf 2/14. Creatinine is uptrending 2.9-->3.37->4.2. Continue Foley, monitor intake output closely, defer further diuretics and fluid management to nephrology- held this am due to soft BP in 89-90s.  Bicarbonate  At 15.  Will check echocardiogram today. RN checking BP on leg only and it is in 89-90s : not sure how accurate. Will have RN check on rt forearm x1 to asses BP in 102s- will give lasix 40 mg x1 - am dose of 60 mg was held this am.  Acute hypoxic respiratory failure: Suspect due to abd distension ileus, some fluid overload.  proBNP 900 but (ECHO 04/15/2018 shows lvef of 55-60%),has CKD. Cont lasix per surger, lungs w/ crackles- suspect atelectasis, encourage IS. Patient weight gain noted from 203->221,( wt was checked 1 wk  PTA was at 203 lb for pcp too per daughter, had significant wt gain and was treated w lasix on recent hospital admission and on torsemide 20 mg daily) wt down to 213->212 lb. she has no shortness of breath.Continue supplemental oxygen  and wean as tolerated.   Dehydration/hypovolemia/hyponatremia in the setting of poor oral intake with nausea/vomiting - received ivf ns and bolus 2/14. Currently NG tube in place, diet once okay with surgery.    Hypervolemia with wt gain:Fluid Lasix management per nephrology.    Lactic acidosis: Likely multifactorial in setting of nausea vomiting ileus, CKD and also poor clearance due to her liver cirrhosis. trended up to 3.8 and was trending down on repeat. Currently afebrile hemodynamically stable.  CT abdomen without abscess and shows improved diverticulitis.  Hypokalemia/hyperkalemia- k stable  DM type 2 on insulin,uncontrolled A1c 11.6 04/07/2019,uncontrolled hyperglycemia: Hold glipizide, cont lantus at lower dose at 10 units while npo- at home on Tresiba 50 units daily.Continue sliding scale.  Sugar appears stable.  Hyperlipidemia:hold statin.  Mouth ulcer likely 2/2 chemo.Was placed on Valtrex/diflucan and magic mouthwash per onco.  Mouth sore has improved  Recurrent breast adenocarcinoma, left: Cepacetabine on hold,oncology has been consulted and will follow up as outpatient.Discussed with oncologist plan is for outpatient follow-up possible radiation to her left chest wall disease.  Cirrhosis with portal hypertension:Appears compensated. Bilirubin slightly abnormal.Monitor. Anasarca with ascites small bilateral pleural effusion edema along the flank and hips and subcutaneous tissue/third spacing seen-in the CT scan abdomen: suspect multifactorial due to hypoalbuminemia/liver cirrhosis CKD IV fluids.  Monitor volume status closely.  HTN: BP was soft 2/14. Needed ivf. Stable now.Stopped her home amlodipine, metoprolol 2/14.  GERD:IV Protonix 40 daily.    Morbid obesity with Body mass index is 41.51 kg/m.  With hypoalbuminemia augment nutrition once able to  Rashes:recently seen by dermatology.  Multilevel chronic back compressions and multilevel bony foraminal impingement, status post operative findings sen in CT.   Debility/deconditioning/multiple comorbidities: Palliative care consulted.  Patient's poor candidate for dialysis and patient/family.  Patient is at risk of acute  decompensation and death.  DVT prophylaxis: SCD/ Heparin. Communication: plan of care discussed with patient at bedside previously. I had called and updated daughter multiple times, agrees w DNR, and plan on palliative consult.She does not want to see her in pain and wants her to be comfortable.  She understands that given her multiple comorbidities, acute illness, she is at risk for acute decompensation.   Code Status: she is DNR, Disposition Plan: Patient is from: Home Anticipated Disposition: TBD Barriers to discharge or conditions that needs to be met prior to discharge: Once NG tube out, able to tolerate diet and renal function stable, palliative is also consulted.  Consultants: Oncology, surgery, Nephro, palliative care. Procedures:see note  ECHO 2/172020 The left ventricle has normal systolic function, with an ejection  fraction of 55-60%. The cavity size was normal. There is moderately  increased left ventricular wall thickness. Left ventricular diastolic  parameters were normal.  Microbiology:see note   Medications: Scheduled Meds: . aspirin EC  81 mg Oral Daily  . brimonidine  1 drop Both Eyes BID  . budesonide  2 mL Inhalation BID  . Chlorhexidine Gluconate Cloth  6 each Topical Daily  . dorzolamide  1 drop Both Eyes BID  . furosemide  60 mg Intravenous BID  . heparin  5,000 Units Subcutaneous Q8H  . insulin aspart  0-15 Units Subcutaneous Q6H  . insulin glargine  10 Units Subcutaneous Daily  . ipratropium-albuterol  3 mL Nebulization TID  . latanoprost  1 drop Both Eyes QHS  . magic mouthwash w/lidocaine  5 mL Oral QID  . mouth rinse  15 mL Mouth Rinse BID  . mirabegron ER  50 mg  Oral Daily  . mirtazapine  15 mg Oral QHS  . pantoprazole (PROTONIX) IV  40 mg Intravenous Q24H  . sodium chloride flush  10-40 mL Intracatheter Q12H   Continuous Infusions: . acyclovir Stopped (04/15/19 0413)  . fluconazole (DIFLUCAN) IV    . piperacillin-tazobactam (ZOSYN)  IV       Antimicrobials: Anti-infectives (From admission, onward)   Start     Dose/Rate Route Frequency Ordered Stop   04/15/19 1500  fluconazole (DIFLUCAN) IVPB 50 mg     50 mg 25 mL/hr over 60 Minutes Intravenous Every 24 hours 04/15/19 0654     04/15/19 1400  piperacillin-tazobactam (ZOSYN) IVPB 2.25 g     2.25 g 100 mL/hr over 30 Minutes Intravenous Every 8 hours 04/15/19 0654     04/14/19 1600  acyclovir (ZOVIRAX) 400 mg in dextrose 5 % 100 mL IVPB     400 mg 108 mL/hr over 60 Minutes Intravenous Every 12 hours 04/14/19 1409     04/14/19 1500  fluconazole (DIFLUCAN) IVPB 100 mg  Status:  Discontinued     100 mg 50 mL/hr over 60 Minutes Intravenous Every 24 hours 04/14/19 1409 04/15/19 0654   04/13/19 1200  piperacillin-tazobactam (ZOSYN) IVPB 2.25 g  Status:  Discontinued     2.25 g 100 mL/hr over 30 Minutes Intravenous Every 6 hours 04/13/19 0951 04/15/19 0654   04/10/19 1200  ciprofloxacin (CIPRO) IVPB 400 mg  Status:  Discontinued     400 mg 200 mL/hr over 60 Minutes Intravenous Every 24 hours 04/15/2019 1530 04/13/19 0931   04/10/19 0000  ciprofloxacin (CIPRO) IVPB 400 mg  Status:  Discontinued     400 mg 200 mL/hr over 60 Minutes Intravenous Every 12 hours 04/23/2019 1523 04/24/2019 1530   04/06/2019 2200  metroNIDAZOLE (FLAGYL) IVPB 500 mg  Status:  Discontinued     500 mg 100 mL/hr over 60 Minutes Intravenous Every 8 hours 04/13/2019 1523 04/13/19 0931   04/12/2019 2200  valACYclovir (VALTREX) tablet 500 mg  Status:  Discontinued     500 mg Oral 2 times daily 04/20/2019 1740 04/14/19 1409   04/24/2019 1830  fluconazole (DIFLUCAN) tablet 200 mg  Status:  Discontinued     200 mg Oral Daily 04/05/2019 1740 04/14/19 1409   04/20/2019 1115  ciprofloxacin (CIPRO) IVPB 400 mg     400 mg 200 mL/hr over 60 Minutes Intravenous  Once 04/27/2019 1101 04/27/2019 1339   04/03/2019 1115  metroNIDAZOLE (FLAGYL) IVPB 500 mg     500 mg 100 mL/hr over 60 Minutes Intravenous  Once 04/15/2019 1101 04/08/2019 1339      Objective: Vitals:   04/15/19 0400 04/15/19 0500 04/15/19 0800 04/15/19 0845  BP: (!) 89/42     Pulse: (!) 104     Resp: 12     Temp:   98.9 F (37.2 C)   TempSrc:   Oral   SpO2: 90%   94%  Weight:  96.4 kg    Height:        Intake/Output Summary (Last 24 hours) at 04/15/2019 0935 Last data filed at 04/15/2019 0557 Gross per 24 hour  Intake 482.23 ml  Output 1150 ml  Net -667.77 ml   Filed Weights   04/13/19 1301 04/14/19 0500 04/15/19 0500  Weight: 100.2 kg 96.8 kg 96.4 kg   Weight change: -4.1 kg  Body mass index is 41.51 kg/m.  Intake/Output from previous day: 02/15 0701 - 02/16 0700 In: 482.2 [IV Piggyback:482.2] Out: 1150 [Urine:650; Emesis/NG output:500] Intake/Output  this shift: No intake/output data recorded.  Examination: General exam: Alert awake oriented at baseline, obese, on nasal cannula oxygen, NG tube present.   HEENT:Oral mucosa dry/chapped,Ear/Nose WNL grossly, dentition normal. Respiratory system: bilaterally basal crackles present, no use of accessory muscle Cardiovascular system: S1 & S2 +,No JVD,. Gastrointestinal system: Abdomen soft, obese and mildly distended, nontender, bowel sounds sluggish. Nervous System:Alert, awake, moving extremities and grossly nonfocal Extremities: Mild leg edema, distal peripheral pulses palpable.  Skin: mild rashes,no icterus. MSK: Normal muscle bulk,tone, power.  Data Reviewed: I have personally reviewed following labs and imaging studies  CBC: Recent Labs  Lab 04/08/19 1420 04/08/19 1420 04/27/2019 1035 04/10/19 0454 04/11/19 0729 04/12/19 0439 04/13/19 0536 04/14/19 0544 04/15/19 0536  WBC 8.3   < > 10.3   < > 11.1* 10.3 11.1* 13.1* 10.9*  NEUTROABS 6.3  --  8.1*  --   --   --   --   --   --   HGB 11.1*   < > 13.2   < > 13.0 13.3 14.4 14.2 13.9  HCT 32.4*   < > 38.0   < > 37.2 39.3 41.8 41.8 40.8  MCV 109.1*   < > 109.5*   < > 110.4* 112.0* 109.4* 113.3* 114.3*  PLT 93*   < > 110*   < > 128* 141*  160 169 151   < > = values in this interval not displayed.   Basic Metabolic Panel: Recent Labs  Lab 04/12/19 0439 04/13/19 0536 04/13/19 1252 04/14/19 0544 04/15/19 0536  NA 130* 134* 132* 138 138  K 4.0 5.3* 4.4 4.3 4.4  CL 106 110 107 110 110  CO2 15* 12* 14* 18* 15*  GLUCOSE 275* 213* 214* 139* 168*  BUN 53* 64* 68* 77* 88*  CREATININE 2.21* 2.64* 2.95* 3.37* 4.25*  CALCIUM 7.8* 8.3* 8.0* 8.3* 8.2*  MG  --   --   --  2.1  --    GFR: Estimated Creatinine Clearance: 10.4 mL/min (A) (by C-G formula based on SCr of 4.25 mg/dL (H)). Liver Function Tests: Recent Labs  Lab 04/10/19 0454 04/12/19 0439 04/13/19 0536 04/14/19 0544 04/15/19 0536  AST '19 18 26 20 '$ 37  ALT '11 9 11 11 13  '$ ALKPHOS 54 58 59 59 58  BILITOT 1.5* 1.3* 1.6* 1.5* 2.0*  PROT 4.8* 4.1* 4.0* 3.8* 3.8*  ALBUMIN 2.4* 2.1* 2.0* 2.0* 1.8*   Recent Labs  Lab 04/06/2019 1035  LIPASE 27   No results for input(s): AMMONIA in the last 168 hours. Coagulation Profile: Recent Labs  Lab 04/10/19 0454  INR 1.7*   Cardiac Enzymes: No results for input(s): CKTOTAL, CKMB, CKMBINDEX, TROPONINI in the last 168 hours. BNP (last 3 results) No results for input(s): PROBNP in the last 8760 hours. HbA1C: No results for input(s): HGBA1C in the last 72 hours. CBG: Recent Labs  Lab 04/14/19 0524 04/14/19 1153 04/14/19 1609 04/14/19 2305 04/15/19 0516  GLUCAP 129* 132* 133* 150* 157*   Lipid Profile: No results for input(s): CHOL, HDL, LDLCALC, TRIG, CHOLHDL, LDLDIRECT in the last 72 hours. Thyroid Function Tests: No results for input(s): TSH, T4TOTAL, FREET4, T3FREE, THYROIDAB in the last 72 hours. Anemia Panel: No results for input(s): VITAMINB12, FOLATE, FERRITIN, TIBC, IRON, RETICCTPCT in the last 72 hours. Sepsis Labs: Recent Labs  Lab 04/13/19 0822 04/13/19 1252 04/13/19 1800  LATICACIDVEN 2.9* 3.8* 3.2*    Recent Results (from the past 240 hour(s))  Respiratory Panel by RT PCR (Flu A&B,  Covid) -  Nasopharyngeal Swab     Status: None   Collection Time: 04/08/2019 11:02 AM   Specimen: Nasopharyngeal Swab  Result Value Ref Range Status   SARS Coronavirus 2 by RT PCR NEGATIVE NEGATIVE Final    Comment: (NOTE) SARS-CoV-2 target nucleic acids are NOT DETECTED. The SARS-CoV-2 RNA is generally detectable in upper respiratoy specimens during the acute phase of infection. The lowest concentration of SARS-CoV-2 viral copies this assay can detect is 131 copies/mL. A negative result does not preclude SARS-Cov-2 infection and should not be used as the sole basis for treatment or other patient management decisions. A negative result may occur with  improper specimen collection/handling, submission of specimen other than nasopharyngeal swab, presence of viral mutation(s) within the areas targeted by this assay, and inadequate number of viral copies (<131 copies/mL). A negative result must be combined with clinical observations, patient history, and epidemiological information. The expected result is Negative. Fact Sheet for Patients:  PinkCheek.be Fact Sheet for Healthcare Providers:  GravelBags.it This test is not yet ap proved or cleared by the [place] FDA and  has been authorized for detection and/or diagnosis of SARS-CoV-2 by FDA under an Emergency Use Authorization (EUA). This EUA will remain  in effect (meaning this test can be used) for the duration of the COVID-19 declaration under Section 564(b)(1) of the Act, 21 U.S.C. section 360bbb-3(b)(1), unless the authorization is terminated or revoked sooner.    Influenza A by PCR NEGATIVE NEGATIVE Final   Influenza B by PCR NEGATIVE NEGATIVE Final    Comment: (NOTE) The Xpert Xpress SARS-CoV-2/FLU/RSV assay is intended as an aid in  the diagnosis of influenza from Nasopharyngeal swab specimens and  should not be used as a sole basis for treatment. Nasal washings and  aspirates  are unacceptable for Xpert Xpress SARS-CoV-2/FLU/RSV  testing. Fact Sheet for Patients: PinkCheek.be Fact Sheet for Healthcare Providers: GravelBags.it This test is not yet approved or cleared by the [place] FDA and  has been authorized for detection and/or diagnosis of SARS-CoV-2 by  FDA under an Emergency Use Authorization (EUA). This EUA will remain  in effect (meaning this test can be used) for the duration of the  Covid-19 declaration under Section 564(b)(1) of the Act, 21  U.S.C. section 360bbb-3(b)(1), unless the authorization is  terminated or revoked. Performed at [facility], [place] [address] [place], [place] [address]   MRSA PCR Screening     Status: None   Collection Time: 04/14/2019  6:38 PM   Specimen: Nasopharyngeal  Result Value Ref Range Status   MRSA by PCR NEGATIVE NEGATIVE Final    Comment:        The GeneXpert MRSA Assay (FDA approved for NASAL specimens only), is one component of a comprehensive MRSA colonization surveillance program. It is not intended to diagnose MRSA infection nor to guide or monitor treatment for MRSA infections. Performed at [facility], [place] [address], [place], [place] [address]   C difficile quick scan w PCR reflex     Status: None   Collection Time: 04/10/19  6:06 PM   Specimen: STOOL  Result Value Ref Range Status   C Diff antigen NEGATIVE NEGATIVE Final   C Diff toxin NEGATIVE NEGATIVE Final   C Diff interpretation No C. difficile detected.  Final    Comment: Performed at [facility], [place] [address] [place],  [address]  MRSA PCR Screening     Status: None   Collection  Time: 04/13/19 12:18 PM   Specimen: Nasal Mucosa; Nasopharyngeal  Result Value Ref Range Status   MRSA by PCR NEGATIVE NEGATIVE Final    Comment:        The GeneXpert MRSA Assay (FDA approved for NASAL specimens only), is one  component of a comprehensive MRSA colonization surveillance program. It is not intended to diagnose MRSA infection nor to guide or monitor treatment for MRSA infections. Performed at [facility], [place] [address], [place], [place] [address]     Radiology Studies: CT ABDOMEN PELVIS WO CONTRAST  Result Date: 04/13/2019 CLINICAL DATA:  Diverticulitis. Nausea and vomiting for 5 days. History of breast cancer. EXAM: CT ABDOMEN AND PELVIS WITHOUT CONTRAST TECHNIQUE: Multidetector CT imaging of the abdomen and pelvis was performed following the standard protocol without IV contrast. COMPARISON:  04/03/2019 FINDINGS: Lower chest: New small bilateral pleural effusions with passive atelectasis. Stable coronary atherosclerosis. Feeding tube extends through the distal esophagus into the stomach body. Mild atelectasis in the lingula. Hepatobiliary: Hepatic cirrhosis with nodular contour. Gallbladder surgically absent. Pancreas: Unremarkable Spleen: Stable hypodense 1.7 by 1.2 cm anterior splenic lesion on image 23/3, no change from the recent prior exam. Adrenals/Urinary Tract: Foley catheter in the empty urinary bladder. Left renal atrophy. Adrenal glands unremarkable. Stomach/Bowel: Periampullary duodenal diverticulum. Sigmoid colon diverticulosis. Mild residual stranding along the sigmoid colon although improved from prior, compatible with improving diverticulitis. Vascular/Lymphatic: Aortoiliac atherosclerotic vascular disease. Portosystemic collaterals along the retroperitoneum. Reproductive: Uterus absent. Adnexa unremarkable. Other: Moderate ascites, new compared to the prior exam. Increased flank edema along the lateral abdominal wall musculature and hips. Stable low-level edema at the root of the mesentery. Musculoskeletal: Stable fusions from L1 through L5 with posterolateral rod and pedicle screw fixation on the left at L2-L3-L4 and with a separate left rod at L5-S1; and on the right at  L2-3 and separately at L4-L5-S1. Chronic compressions at all levels between T12 and L4 inclusive. Posterior decompression of much of the lumbar spine. Osseous foraminal narrowing on the right at T12-L1, L2-3, and L3-4; and on the left at T12-L1, L4-5, and L5-S1. No appreciable lucency around the pedicle screws. No new fracture. IMPRESSION: 1. New moderate ascites. New small bilateral pleural effusions and increased edema along the flanks and hips in the subcutaneous tissues, compatible with accentuated third spacing of fluid. 2. Improved sigmoid diverticulitis, nearly resolved. 3. Hepatic cirrhosis with portal venous hypertension. 4. Stable hypodense lesion in the anterior spleen. 5. Other imaging findings of potential clinical significance: Coronary atherosclerosis. Left renal atrophy. Periampullary duodenal diverticulum. Stable postoperative findings in the lumbar spine with multilevel chronic compressions and multilevel bony foraminal impingement. Electronically Signed   By: Van Clines M.D.   On: 04/13/2019 17:47   US RENAL  Result Date: 04/13/2019 CLINICAL DATA:  Acute kidney injury. History of diabetes, chronic kidney disease, cirrhosis. EXAM: RENAL / URINARY TRACT ULTRASOUND COMPLETE COMPARISON:  Abdomen CT dated 04/21/2019. FINDINGS: Right Kidney: Renal measurements: 10.2 x 4.8 x 4.8 cm = volume: 123 mL. Echogenic cortex. No mass or hydronephrosis. Left Kidney: Renal measurements: 9 x 4 x 4.3 cm = volume: 83 mL. Echogenic cortex. Atrophic, corresponding to CT appearance. No mass or hydronephrosis. Bladder: Decompressed by Foley catheter. Other: None. IMPRESSION: 1. Both kidneys are echogenic indicating chronic medical renal disease. 2. Slight LEFT kidney is somewhat atrophic, as also described on recent CT abdomen report of 04/02/2019. 3. No acute findings.  No hydronephrosis. 4. Bladder decompressed by Foley catheter. Electronically Signed   By:  Franki Cabot M.D.   On: 04/13/2019 14:59   DG  Abd Portable 1V  Result Date: 04/15/2019 CLINICAL DATA:  Abdominal distention EXAM: PORTABLE ABDOMEN - 1 VIEW COMPARISON:  04/13/2019 FINDINGS: NG tube remains in the stomach. Mild gaseous distention of the right colon and transverse colon, similar to prior study. No organomegaly or free air. IMPRESSION: Continued mild gaseous distention of the right colon and transverse colon, favor ileus. NG tube in the stomach. Electronically Signed   By: Rolm Baptise M.D.   On: 04/15/2019 08:55   DG Abd Portable 1V  Result Date: 04/13/2019 CLINICAL DATA:  Check gastric catheter placement EXAM: PORTABLE ABDOMEN - 1 VIEW COMPARISON:  04/13/2019 FINDINGS: Gastric catheter is now been placed in the distal stomach. The stomach is decompressed. There remains some gaseous dilation of the transverse colon. No other focal abnormality is noted. IMPRESSION: Gastric catheter within the stomach. Electronically Signed   By: Inez Catalina M.D.   On: 04/13/2019 09:50   [place] EKG SITE RITE  Result Date: 04/13/2019 If Site Rite image not attached, placement could not be confirmed due to current cardiac rhythm.   LOS: 5 days   Time spent: More than 50% of that time was spent in counseling and/or coordination of care.  Antonieta Pert, MD [facility]  04/15/2019, 9:35 AM

## 2019-04-16 DIAGNOSIS — C50912 Malignant neoplasm of unspecified site of left female breast: Secondary | ICD-10-CM

## 2019-04-16 LAB — COMPREHENSIVE METABOLIC PANEL
ALT: 16 U/L (ref 0–44)
AST: 40 U/L (ref 15–41)
Albumin: 1.7 g/dL — ABNORMAL LOW (ref 3.5–5.0)
Alkaline Phosphatase: 55 U/L (ref 38–126)
Anion gap: 14 (ref 5–15)
BUN: 104 mg/dL — ABNORMAL HIGH (ref 8–23)
CO2: 15 mmol/L — ABNORMAL LOW (ref 22–32)
Calcium: 8.3 mg/dL — ABNORMAL LOW (ref 8.9–10.3)
Chloride: 111 mmol/L (ref 98–111)
Creatinine, Ser: 4.84 mg/dL — ABNORMAL HIGH (ref 0.44–1.00)
GFR calc Af Amer: 9 mL/min — ABNORMAL LOW (ref >60)
GFR calc non Af Amer: 8 mL/min — ABNORMAL LOW (ref >60)
Glucose, Bld: 126 mg/dL — ABNORMAL HIGH (ref 70–99)
Potassium: 4.5 mmol/L (ref 3.5–5.1)
Sodium: 140 mmol/L (ref 135–145)
Total Bilirubin: 1.9 mg/dL — ABNORMAL HIGH (ref 0.3–1.2)
Total Protein: 3.6 g/dL — ABNORMAL LOW (ref 6.5–8.1)

## 2019-04-16 LAB — CBC
HCT: 40.8 % (ref 36.0–46.0)
Hemoglobin: 13.4 g/dL (ref 12.0–15.0)
MCH: 37.7 pg — ABNORMAL HIGH (ref 26.0–34.0)
MCHC: 32.8 g/dL (ref 30.0–36.0)
MCV: 114.9 fL — ABNORMAL HIGH (ref 80.0–100.0)
Platelets: 152 10*3/uL (ref 150–400)
RBC: 3.55 MIL/uL — ABNORMAL LOW (ref 3.87–5.11)
RDW: 20.4 % — ABNORMAL HIGH (ref 11.5–15.5)
WBC: 9.7 10*3/uL (ref 4.0–10.5)
nRBC: 9.5 % — ABNORMAL HIGH (ref 0.0–0.2)

## 2019-04-16 LAB — GLUCOSE, CAPILLARY
Glucose-Capillary: 122 mg/dL — ABNORMAL HIGH (ref 70–99)
Glucose-Capillary: 133 mg/dL — ABNORMAL HIGH (ref 70–99)

## 2019-04-16 MED ORDER — LORAZEPAM 2 MG/ML IJ SOLN
0.5000 mg | INTRAMUSCULAR | Status: DC | PRN
  Administered 2019-04-17: 09:00:00 0.5 mg via INTRAVENOUS
  Filled 2019-04-16: qty 1

## 2019-04-16 MED ORDER — SCOPOLAMINE 1 MG/3DAYS TD PT72
1.0000 | MEDICATED_PATCH | TRANSDERMAL | Status: DC
  Filled 2019-04-16: qty 1

## 2019-04-16 MED ORDER — FLUCONAZOLE 100MG IVPB: 50.0000 mg | INTRAVENOUS | Status: DC

## 2019-04-16 MED ORDER — FENTANYL CITRATE (PF) 100 MCG/2ML IJ SOLN
25.0000 ug | INTRAMUSCULAR | Status: DC | PRN
  Administered 2019-04-17 (×3): 25 ug via INTRAVENOUS
  Filled 2019-04-16 (×3): qty 2

## 2019-04-16 MED ORDER — ALBUMIN HUMAN 25 % IV SOLN
12.5000 g | Freq: Four times a day (QID) | INTRAVENOUS | Status: DC
  Administered 2019-04-16: 12:00:00 12.5 g via INTRAVENOUS

## 2019-04-16 MED ORDER — SODIUM CHLORIDE 0.9 % IV BOLUS
500.0000 mL | Freq: Once | INTRAVENOUS | Status: AC
  Administered 2019-04-16: 11:00:00 500 mL via INTRAVENOUS

## 2019-04-16 MED ORDER — LIP MEDEX EX OINT
TOPICAL_OINTMENT | CUTANEOUS | Status: AC
  Filled 2019-04-16: qty 7

## 2019-04-16 NOTE — Progress Notes (Signed)
PROGRESS NOTE    Ramonia S Haire  MRN:[id number] DOB: 09/14/1933 DOA: 04/02/2019 PCP: Hall, John Z, MD   Brief Narrative: 86 YOF with significant history of anemia/osteoarthritis, left breast cancer, cataracts, CKD IV, liver cirrhosis, T2DM on long-term insulin, GERD,hyperlipidemia, hypertension history of multiple falls in the past, nocturia, seasonal allergies, history of facial melanoma with surgical excision in 2012 comes to the ER with multiple episodes of nausea vomiting and diarrhea following her chemotherapy cycle this week without melena hematochezia dysuria but with low-grade fever since Saturday, decreased oral intake for several days. In the ED vitals are stable UA CBC and CMP shows metabolic acidosis/CKD chronic.  LFTs lipase normal COVID-19) Shambley negative, underwent chest x-ray no acute finding, and CT abdomen without contrast showed sigmoid diverticulitis, liver cirrhosis.Patient was placed on IV antibiotic and was admitted on liquid diet and seen by her oncology.C diff negative.  2/14-no more diarrhea, but now having nasuea and also vomited x2, also needed nasal cannula oxygen. NGT placed with improvement in symptoms, blood pressure soft and no urine output given IV fluids Foley placed, had AKI on CKD nephro/surgery was consulted. Moved to SDU.  Subjective: Reports BM, flatus. No abdominal pain/nausea/vomiting Afebrile overnight, t max 100.2,WBC at 9.7k Very lethargic, bun/crea worsening and minimal urine output now Hypotensive and seen again and wrote for 500 ml bolus nad albumin- updated daughter, palliative care coming to see as well. Patient reports " I am ready to meet my lord"  Assessment & Plan:  Nausea vomiting diarrhea with Sigmoid diverticulitis, also contributed by chemotherapy/presumed ilues: diarrhea had improved-but persistent nauseous and vomiting and had no BM- xray with dilated stomach, Transverse colon-suspected ileus-NG tube placed 2/14 and seen by  surgery.Repeat CT abdomen pelvis 2/14 shows improved diverticulitis no bowel obstruction.  Patient on Zosyn pharmacy dosing complete 7 days course. Wbc improved. Had BM- Clamping NGT, sips and d/c NGT later today if tolerating per surgery.  AKI on CKD stage IV/left renal atrophy/metabolic acidosis: Suspect multifactorial with nausea vomiting/diverticulitis  and now with ileus. Baseline creatinine 2.1-2.5. s/p ivf and bicarb due to low urine output on 2/14-also had Foley placed and renal ultrasound obtained-had good urine output and nephro was consulted  and placed on lasix, stopped ivf 2/14.Creatinine is uptrending 2.9-->3.37->4.2->4.8, has Foley. Nephrology on board. NL lVEF on echocardiogram 2/16 blood pressure stable. Pm lasix held 2/16- Cont fluid/diuretics per nephro  Acute hypoxic respiratory failure: Suspect due to abd distension ileus, some fluid overload.  proBNP 900 but (ECHO 04/15/2018 shows lvef of 55-60%),has CKD. Cont lasix per surger, lungs w/ crackles- suspect atelectasis, encourage IS. Patient weight gain noted from 203->221,( wt was checked 1 wk  PTA was at 203 lb for pcp too per daughter, had significant wt gain and was treated w lasix on recent hospital admission and on torsemide 20 mg daily) wt down to 213->212 lb->212.Continue supplemental oxygen and wean as tolerated.   Hypotension- multifactorial- with cirrhosis/third spacing/ NPO-Ileus- ivf bolus and iv albumin.  Dehydration/hypovolemia/hyponatremia in the setting of poor oral intake with nausea/vomiting - received ivf ns and bolus 2/14. Currently NG tube in place, for sips today.  Hypervolemia with wt gain:Fluid Lasix management per nephrology.    Lactic acidosis: Likely multifactorial in setting of nausea vomiting ileus, CKD and also poor clearance due to her liver cirrhosis. trended up to 3.8 and was trending down on repeat. Currently afebrile hemodynamically stable.  CT abdomen without abscess and shows improved  diverticulitis.  Hypokalemia/hyperkalemia- k stable  DM type 2   on insulin,uncontrolled A1c 11.6 04/08/2019,uncontrolled hyperglycemia: Hold glipizide, cont lantus at lower dose at 10 units while npo- at home on Tresiba 50 units daily.Continue sliding scale.  Sugar appears stable.  Hyperlipidemia:holding statin.  Mouth ulcer likely 2/2 chemo.Was placed on Valtrex/diflucan and magic mouthwash per onco.  Mouth sore has improved.  Recurrent breast adenocarcinoma, left: Cepacetabine on hold. Seen by oncology.Discussed with oncologist - plan is for outpatient follow-up.  Cirrhosis with portal hypertension with ascites, Bilirubin abnormal: noted to have Anasarca with ascites small bilateral pleural effusion edema along the flank and hips and subcutaneous tissue/third spacing seen-in the CT scan abdomen: suspect multifactorial due to hypoalbuminemia/liver cirrhosis CKD. Monitor volume status closely, lasix on hold for now.  HTN: BP was soft 2/14- 2/15 but on leg- when checked on arm stable in 120s. Home amlodipine, metoprolol on hold 2/14.  GERD:IV Protonix 40 daily.    Morbid obesity with Body mass index is 41.51 kg/m.  With hypoalbuminemia augment nutrition once able to  Rashes:recently seen by dermatology.  Multilevel chronic back compressions and multilevel bony foraminal impingement, status post operative findings sen in CT.   Debility/deconditioning/multiple comorbidities: Palliative care consulted appreciate inputs. Patient is poor candidate for dialysis and renal failure is worsening. Patient and daughter desires palliative care and to keep her comfrotable, weill cont on supportive care.Patient is at risk of acute decompensation and death.  DVT prophylaxis: SCD/ Heparin. Communication: plan of care discussed with patient at bedside. Daughter had been updated.  Palliative  Care following.She does not want to see her in pain and wants her to be comfortable.  She understands that given her  multiple comorbidities, acute illness, she is at risk for acute decompensation.  updated daughter over phone- they are thinking about palliative care comfort measures based on patient's wishes. Overall Poor prognosis I also spoke w Dr Anwar who is on her way to see patient  Code Status: she is DNR, Disposition Plan: Patient is from: Home Anticipated Disposition: TBD Barriers to discharge or conditions that needs to be met prior to discharge: remains inhosue for management of complex comorbidities, palliative care plan  Consultants: Oncology, surgery, Nephro, palliative care. Procedures:see note  ECHO 2/172020 The left ventricle has normal systolic function, with an ejection  fraction of 55-60%. The cavity size was normal. There is moderately  increased left ventricular wall thickness. Left ventricular diastolic  parameters were normal.  Microbiology:see note   Medications: Scheduled Meds: . aspirin EC  81 mg Oral Daily  . brimonidine  1 drop Both Eyes BID  . budesonide  2 mL Inhalation BID  . Chlorhexidine Gluconate Cloth  6 each Topical Daily  . dorzolamide  1 drop Both Eyes BID  . heparin  5,000 Units Subcutaneous Q8H  . insulin aspart  0-15 Units Subcutaneous Q6H  . insulin glargine  10 Units Subcutaneous Daily  . ipratropium-albuterol  3 mL Nebulization TID  . latanoprost  1 drop Both Eyes QHS  . magic mouthwash w/lidocaine  5 mL Oral QID  . mouth rinse  15 mL Mouth Rinse BID  . mirabegron ER  50 mg Oral Daily  . mirtazapine  15 mg Oral QHS  . pantoprazole (PROTONIX) IV  40 mg Intravenous Q24H  . sodium chloride flush  10-40 mL Intracatheter Q12H   Continuous Infusions: . acyclovir Stopped (04/16/19 0531)  . fluconazole (DIFLUCAN) IV Stopped (04/15/19 1730)  . piperacillin-tazobactam (ZOSYN)  IV Stopped (04/16/19 0622)    Antimicrobials: Anti-infectives (From admission, onward)   Start       Dose/Rate Route Frequency Ordered Stop   04/16/19 0400  acyclovir (ZOVIRAX)  400 mg in dextrose 5 % 100 mL IVPB     400 mg 108 mL/hr over 60 Minutes Intravenous Every 24 hours 04/15/19 1121     04/15/19 1500  fluconazole (DIFLUCAN) IVPB 50 mg     50 mg 25 mL/hr over 60 Minutes Intravenous Every 24 hours 04/15/19 0654     04/15/19 1400  piperacillin-tazobactam (ZOSYN) IVPB 2.25 g     2.25 g 100 mL/hr over 30 Minutes Intravenous Every 8 hours 04/15/19 0654     04/14/19 1600  acyclovir (ZOVIRAX) 400 mg in dextrose 5 % 100 mL IVPB  Status:  Discontinued     400 mg 108 mL/hr over 60 Minutes Intravenous Every 12 hours 04/14/19 1409 04/15/19 1121   04/14/19 1500  fluconazole (DIFLUCAN) IVPB 100 mg  Status:  Discontinued     100 mg 50 mL/hr over 60 Minutes Intravenous Every 24 hours 04/14/19 1409 04/15/19 0654   04/13/19 1200  piperacillin-tazobactam (ZOSYN) IVPB 2.25 g  Status:  Discontinued     2.25 g 100 mL/hr over 30 Minutes Intravenous Every 6 hours 04/13/19 0951 04/15/19 0654   04/10/19 1200  ciprofloxacin (CIPRO) IVPB 400 mg  Status:  Discontinued     400 mg 200 mL/hr over 60 Minutes Intravenous Every 24 hours 04/04/2019 1530 04/13/19 0931   04/10/19 0000  ciprofloxacin (CIPRO) IVPB 400 mg  Status:  Discontinued     400 mg 200 mL/hr over 60 Minutes Intravenous Every 12 hours 04/17/2019 1523 04/17/2019 1530   04/15/2019 2200  metroNIDAZOLE (FLAGYL) IVPB 500 mg  Status:  Discontinued     500 mg 100 mL/hr over 60 Minutes Intravenous Every 8 hours 04/02/2019 1523 04/13/19 0931   04/26/2019 2200  valACYclovir (VALTREX) tablet 500 mg  Status:  Discontinued     500 mg Oral 2 times daily 04/01/2019 1740 04/14/19 1409   04/24/2019 1830  fluconazole (DIFLUCAN) tablet 200 mg  Status:  Discontinued     200 mg Oral Daily 04/21/2019 1740 04/14/19 1409   04/13/2019 1115  ciprofloxacin (CIPRO) IVPB 400 mg     400 mg 200 mL/hr over 60 Minutes Intravenous  Once 04/22/2019 1101 04/26/2019 1339   04/15/2019 1115  metroNIDAZOLE (FLAGYL) IVPB 500 mg     500 mg 100 mL/hr over 60 Minutes Intravenous  Once  04/05/2019 1101 04/24/2019 1339     Objective: Vitals:   04/16/19 0002 04/16/19 0500 04/16/19 0514 04/16/19 0520  BP:   (!) 115/44   Pulse: (!) 57  (!) 115 (!) 113  Resp: (!) 22  (!) 22 18  Temp:      TempSrc:      SpO2: 96%  92% 94%  Weight:  96.4 kg    Height:        Intake/Output Summary (Last 24 hours) at 04/16/2019 0745 Last data filed at 04/16/2019 0622 Gross per 24 hour  Intake 915.77 ml  Output 1100 ml  Net -184.23 ml   Filed Weights   04/14/19 0500 04/15/19 0500 04/16/19 0500  Weight: 96.8 kg 96.4 kg 96.4 kg   Weight change: 0 kg  Body mass index is 41.51 kg/m.  Intake/Output from previous day: 02/16 0701 - 02/17 0700 In: 915.8 [P.O.:470; IV Piggyback:445.8] Out: 1100 [Urine:400; Emesis/NG output:700] Intake/Output this shift: No intake/output data recorded.  Examination: General exam: weak, lethargic,frail, on [place], obese. HEENT:Oral mucosa dry/chapped,Ear/Nose WNL grossly, dentition normal. Respiratory system: bilaterally diminished with basal   crackles present, no use of accessory muscle Cardiovascular system: S1 & S2 +,No JVD,. Gastrointestinal system: Abdomen soft, obese NT, BS sluggish/+ Nervous System:Alert, awake, moving extremities and grossly nonfocal Extremities: Mild leg and arm edema, distal peripheral pulses palpable.  Skin: mild rashes,no icterus. MSK: Normal muscle bulk,tone, power.  Data Reviewed: I have personally reviewed following labs and imaging studies  CBC: Recent Labs  Lab 04/25/2019 1035 04/10/19 0454 04/12/19 0439 04/13/19 0536 04/14/19 0544 04/15/19 0536 04/16/19 0155  WBC 10.3   < > 10.3 11.1* 13.1* 10.9* 9.7  NEUTROABS 8.1*  --   --   --   --   --   --   HGB 13.2   < > 13.3 14.4 14.2 13.9 13.4  HCT 38.0   < > 39.3 41.8 41.8 40.8 40.8  MCV 109.5*   < > 112.0* 109.4* 113.3* 114.3* 114.9*  PLT 110*   < > 141* 160 169 151 152   < > = values in this interval not displayed.   Basic Metabolic Panel: Recent Labs  Lab  04/13/19 0536 04/13/19 1252 04/14/19 0544 04/15/19 0536 04/16/19 0155  NA 134* 132* 138 138 140  K 5.3* 4.4 4.3 4.4 4.5  CL 110 107 110 110 111  CO2 12* 14* 18* 15* 15*  GLUCOSE 213* 214* 139* 168* 126*  BUN 64* 68* 77* 88* 104*  CREATININE 2.64* 2.95* 3.37* 4.25* 4.84*  CALCIUM 8.3* 8.0* 8.3* 8.2* 8.3*  MG  --   --  2.1  --   --    GFR: Estimated Creatinine Clearance: 9.2 mL/min (A) (by C-G formula based on SCr of 4.84 mg/dL (H)). Liver Function Tests: Recent Labs  Lab 04/12/19 0439 04/13/19 0536 04/14/19 0544 04/15/19 0536 04/16/19 0155  AST _0 37 40  ALT _1 ALKPHOS 58 59 59 58 55  BILITOT 1.3* 1.6* 1.5* 2.0* 1.9*  PROT 4.1* 4.0* 3.8* 3.8* 3.6*  ALBUMIN 2.1* 2.0* 2.0* 1.8* 1.7*   Recent Labs  Lab 04/12/2019 1035  LIPASE 27   No results for input(s): AMMONIA in the last 168 hours. Coagulation Profile: Recent Labs  Lab 04/10/19 0454  INR 1.7*   Cardiac Enzymes: No results for input(s): CKTOTAL, CKMB, CKMBINDEX, TROPONINI in the last 168 hours. BNP (last 3 results) No results for input(s): PROBNP in the last 8760 hours. HbA1C: No results for input(s): HGBA1C in the last 72 hours. CBG: Recent Labs  Lab 04/15/19 0516 04/15/19 1149 04/15/19 1933 04/15/19 2328 04/16/19 0621  GLUCAP 157* 140* 131* 105* 122*   Lipid Profile: No results for input(s): CHOL, HDL, LDLCALC, TRIG, CHOLHDL, LDLDIRECT in the last 72 hours. Thyroid Function Tests: No results for input(s): TSH, T4TOTAL, FREET4, T3FREE, THYROIDAB in the last 72 hours. Anemia Panel: No results for input(s): VITAMINB12, FOLATE, FERRITIN, TIBC, IRON, RETICCTPCT in the last 72 hours. Sepsis Labs: Recent Labs  Lab 04/13/19 5397 04/13/19 1252 04/13/19 1800  LATICACIDVEN 2.9* 3.8* 3.2*    Recent Results (from the past 240 hour(s))  Respiratory Panel by RT PCR (Flu A&B, Covid) - Nasopharyngeal Swab     Status: None   Collection Time: 04/22/2019 11:02 AM   Specimen: Nasopharyngeal  Swab  Result Value Ref Range Status   SARS Coronavirus 2 by RT PCR NEGATIVE NEGATIVE Final    Comment: (NOTE) SARS-CoV-2 target nucleic acids are NOT DETECTED. The SARS-CoV-2 RNA is generally detectable in upper respiratoy specimens during the acute phase of infection. The lowest concentration  of SARS-CoV-2 viral copies this assay can detect is 131 copies/mL. A negative result does not preclude SARS-Cov-2 infection and should not be used as the sole basis for treatment or other patient management decisions. A negative result may occur with  improper specimen collection/handling, submission of specimen other than nasopharyngeal swab, presence of viral mutation(s) within the areas targeted by this assay, and inadequate number of viral copies (<131 copies/mL). A negative result must be combined with clinical observations, patient history, and epidemiological information. The expected result is Negative. Fact Sheet for Patients:  https://www.fda.gov/media/142436/download Fact Sheet for Healthcare Providers:  https://www.fda.gov/media/142435/download This test is not yet ap proved or cleared by the [place] FDA and  has been authorized for detection and/or diagnosis of SARS-CoV-2 by FDA under an Emergency Use Authorization (EUA). This EUA will remain  in effect (meaning this test can be used) for the duration of the COVID-19 declaration under Section 564(b)(1) of the Act, 21 U.S.C. section 360bbb-3(b)(1), unless the authorization is terminated or revoked sooner.    Influenza A by PCR NEGATIVE NEGATIVE Final   Influenza B by PCR NEGATIVE NEGATIVE Final    Comment: (NOTE) The Xpert Xpress SARS-CoV-2/FLU/RSV assay is intended as an aid in  the diagnosis of influenza from Nasopharyngeal swab specimens and  should not be used as a sole basis for treatment. Nasal washings and  aspirates are unacceptable for Xpert Xpress SARS-CoV-2/FLU/RSV  testing. Fact Sheet for  Patients: https://www.fda.gov/media/142436/download Fact Sheet for Healthcare Providers: https://www.fda.gov/media/142435/download This test is not yet approved or cleared by the [place] FDA and  has been authorized for detection and/or diagnosis of SARS-CoV-2 by  FDA under an Emergency Use Authorization (EUA). This EUA will remain  in effect (meaning this test can be used) for the duration of the  Covid-19 declaration under Section 564(b)(1) of the Act, 21  U.S.C. section 360bbb-3(b)(1), unless the authorization is  terminated or revoked. Performed at [facility], [address] [place], [place] [address]   MRSA PCR Screening     Status: None   Collection Time: 04/27/2019  6:38 PM   Specimen: Nasopharyngeal  Result Value Ref Range Status   MRSA by PCR NEGATIVE NEGATIVE Final    Comment:        The GeneXpert MRSA Assay (FDA approved for NASAL specimens only), is one component of a comprehensive MRSA colonization surveillance program. It is not intended to diagnose MRSA infection nor to guide or monitor treatment for MRSA infections. Performed at [facility], [address] [place], [place] [address]   C difficile quick scan w PCR reflex     Status: None   Collection Time: 04/10/19  6:06 PM   Specimen: STOOL  Result Value Ref Range Status   C Diff antigen NEGATIVE NEGATIVE Final   C Diff toxin NEGATIVE NEGATIVE Final   C Diff interpretation No C. difficile detected.  Final    Comment: Performed at [facility], [address] , [place] [address]  MRSA PCR Screening     Status: None   Collection Time: 04/13/19 12:18 PM   Specimen: Nasal Mucosa; Nasopharyngeal  Result Value Ref Range Status   MRSA by PCR NEGATIVE NEGATIVE Final    Comment:        The GeneXpert MRSA Assay (FDA approved for NASAL specimens only), is one component of a comprehensive MRSA colonization surveillance program. It is  not intended to diagnose MRSA infection nor to guide or monitor treatment for   MRSA infections. Performed at [facility], [place] [address], [place], [place] [address]     Radiology Studies: DG Abd Portable 1V  Result Date: 04/15/2019 CLINICAL DATA:  Abdominal distention EXAM: PORTABLE ABDOMEN - 1 VIEW COMPARISON:  04/13/2019 FINDINGS: NG tube remains in the stomach. Mild gaseous distention of the right colon and transverse colon, similar to prior study. No organomegaly or free air. IMPRESSION: Continued mild gaseous distention of the right colon and transverse colon, favor ileus. NG tube in the stomach. Electronically Signed   By: Rolm Baptise M.D.   On: 04/15/2019 08:55   ECHOCARDIOGRAM COMPLETE  Result Date: 04/15/2019    ECHOCARDIOGRAM REPORT   Patient Name:   CHARA MARQUARD Date of Exam: 04/15/2019 Medical Rec #:  [id number]        Height:       60.0 in Accession #:    [id number]       Weight:       212.5 lb Date of Birth:  1933-06-29        BSA:          1.92 m Patient Age:    86 years         BP:           121/58 mmHg Patient Gender: F                HR:           111 bpm. Exam Location:  Inpatient Procedure: 2D Echo, Cardiac Doppler, Color Doppler and Intracardiac            Opacification Agent Indications:    R60.0 Extremity edema  History:        Patient has prior history of Echocardiogram examinations, most                 recent 04/15/2018. Abnormal ECG, Signs/Symptoms:Altered Mental                 Status and Fever; Risk Factors:Hypertension, Diabetes and                 Dyslipidemia. Cancer. Hypoxia. Pneumonia. Renal failure. Breast                 cancer.  Sonographer:    Roseanna Rainbow RDCS Referring Phys: [id number] [facility]  Sonographer Comments: Technically challenging study due to limited acoustic windows, Technically difficult study due to poor echo windows, no parasternal window, suboptimal apical window, suboptimal subcostal window and patient is morbidly obese. Image  acquisition challenging due to patient body habitus and Image acquisition challenging due to respiratory motion. Patient in chair. Extremely difficult position for exam. Patient had skin lesions in parasternal region. IMPRESSIONS  1. Left ventricular ejection fraction, by estimation, is 70 to 75%. The left ventricle has hyperdynamic function. LV endocardial border not optimally defined to evaluate regional wall motion. Left ventricular diastolic parameters are indeterminate.  2. Right ventricular systolic function was not well visualized. The right ventricular size is normal.  3. The mitral valve was not well visualized. No evidence of mitral valve regurgitation. No evidence of mitral stenosis.  4. The aortic valve was not well visualized. Aortic valve regurgitation is not visualized. No aortic stenosis is present.  5. The inferior vena cava is normal in size with greater than 50% respiratory variability, suggesting right atrial pressure of 3 mmHg. FINDINGS  Left Ventricle: Left ventricular ejection fraction, by estimation, is 70 to 75%. The left ventricle has hyperdynamic  function. LV endocardial border not optimally defined to evaluate regional wall motion. Definity contrast agent was given IV to delineate the left ventricular endocardial borders. The left ventricular internal cavity size was normal in size. There is no left ventricular hypertrophy. Left ventricular diastolic parameters are indeterminate. Right Ventricle: The right ventricular size is normal. No increase in right ventricular wall thickness. Right ventricular systolic function was not well visualized. Left Atrium: Left atrial size was not well visualized. Right Atrium: Right atrial size was not well visualized. Pericardium: There is no evidence of pericardial effusion. Mitral Valve: The mitral valve was not well visualized. Normal mobility of the mitral valve leaflets. No evidence of mitral valve regurgitation. No evidence of mitral valve stenosis.  Tricuspid Valve: The tricuspid valve is not well visualized. Tricuspid valve regurgitation is not demonstrated. No evidence of tricuspid stenosis. Aortic Valve: The aortic valve was not well visualized. Aortic valve regurgitation is not visualized. No aortic stenosis is present. Pulmonic Valve: The pulmonic valve was not well visualized. Pulmonic valve regurgitation is not visualized. No evidence of pulmonic stenosis. Aorta: The aortic root is normal in size and structure. Venous: The inferior vena cava is normal in size with greater than 50% respiratory variability, suggesting right atrial pressure of 3 mmHg. IAS/Shunts: No atrial level shunt detected by color flow Doppler.   LV Volumes (MOD) LV vol d, MOD A4C: 60.9 ml Diastology LV vol s, MOD A4C: 32.7 ml LV e' lateral:   5.98 cm/s LV SV MOD A4C:     60.9 ml LV E/e' lateral: 7.9                            LV e' medial:    3.26 cm/s                            LV E/e' medial:  14.4  RIGHT VENTRICLE             IVC RV S prime:     19.60 cm/s  IVC diam: 1.16 cm TAPSE (M-mode): 2.0 cm LEFT ATRIUM           Index LA Vol (A4C): 15.4 ml 8.04 ml/m  AORTIC VALVE LVOT Vmax:   96.00 cm/s LVOT Vmean:  72.900 cm/s LVOT VTI:    0.123 m MITRAL VALVE MV Area (PHT): 4.89 cm    SHUNTS MV Decel Time: 155 msec    Systemic VTI: 0.12 m MV E velocity: 47.10 cm/s MV A velocity: 59.70 cm/s MV E/A ratio:  0.79 Candee Furbish MD Electronically signed by Candee Furbish MD Signature Date/Time: 04/15/2019/3:54:24 PM    Final     LOS: 6 days   Time spent: More than 50% of that time was spent in counseling and/or coordination of care.  Antonieta Pert, MD [facility]  04/16/2019, 7:45 AM

## 2019-04-16 NOTE — Progress Notes (Signed)
Clamped NG tube per order. Will continue to monitor patient.

## 2019-04-16 NOTE — Progress Notes (Signed)
I responded to spiritual care support. Pt was alert and family was at bedside. I offered pastoral care with words of comfort, ministry of presence, prayer and a listening ear. Family was very thankful for the chaplain support because their pastor was not able to be physically present. They voiced their hope in their faith in God.  Chaplain available as needed.   Chaplain Resident  Fidel Levy MA [phone]

## 2019-04-16 NOTE — Plan of Care (Signed)
HG clamped, patient provided with ice chips, patient denies any pain at this time. Will continue to monitor.  Problem: Activity: Goal: Risk for activity intolerance will decrease Outcome: Progressing   Problem: Nutrition: Goal: Adequate nutrition will be maintained Outcome: Progressing   Problem: Elimination: Goal: Will not experience complications related to bowel motility Outcome: Progressing Goal: Will not experience complications related to urinary retention Outcome: Progressing   Problem: Safety: Goal: Ability to remain free from injury will improve Outcome: Progressing

## 2019-04-16 NOTE — Progress Notes (Signed)
[facility] Progress Note     Subjective: CC-  Denies any abdominal pain this morning. Thinks she has passed some gas today. BM yesterday. NG with 700cc output last 24 hours, although she's eating a lot of ice chips.  Objective: Vital signs in last 24 hours: Temp:  [98.4 F (36.9 C)-100.2 F (37.9 C)] 98.6 F (37 C) (02/16 2350) Pulse Rate:  [57-120] 113 (02/17 0520) Resp:  [17-29] 18 (02/17 0520) BP: (85-152)/(44-132) 115/44 (02/17 0514) SpO2:  [88 %-96 %] 94 % (02/17 0520) Weight:  [96.4 kg] 96.4 kg (02/17 0500) Last BM Date: 03/31/2019  Intake/Output from previous day: 02/16 0701 - 02/17 0700 In: 915.8 [P.O.:470; IV Piggyback:445.8] Out: 1100 [Urine:400; Emesis/NG output:700] Intake/Output this shift: No intake/output data recorded.  PE: Gen:  Alert, NAD, confused Pulm: rate and effort normal Abd: Soft, mild distension, few BS heard, nontender Skin: no rashes noted, warm and dry  Lab Results:  Recent Labs    04/15/19 0536 04/16/19 0155  WBC 10.9* 9.7  HGB 13.9 13.4  HCT 40.8 40.8  PLT 151 152   BMET Recent Labs    04/15/19 0536 04/16/19 0155  NA 138 140  K 4.4 4.5  CL 110 111  CO2 15* 15*  GLUCOSE 168* 126*  BUN 88* 104*  CREATININE 4.25* 4.84*  CALCIUM 8.2* 8.3*   PT/INR No results for input(s): LABPROT, INR in the last 72 hours. CMP     Component Value Date/Time   NA 140 04/16/2019 0155   K 4.5 04/16/2019 0155   CL 111 04/16/2019 0155   CO2 15 (L) 04/16/2019 0155   GLUCOSE 126 (H) 04/16/2019 0155   BUN 104 (H) 04/16/2019 0155   CREATININE 4.84 (H) 04/16/2019 0155   CREATININE 2.18 (H) 10/08/2018 1146   CALCIUM 8.3 (L) 04/16/2019 0155   PROT 3.6 (L) 04/16/2019 0155   ALBUMIN 1.7 (L) 04/16/2019 0155   AST 40 04/16/2019 0155   AST 27 10/08/2018 1146   ALT 16 04/16/2019 0155   ALT 15 10/08/2018 1146   ALKPHOS 55 04/16/2019 0155   BILITOT 1.9 (H) 04/16/2019 0155   BILITOT 1.0 10/08/2018 1146   GFRNONAA 8 (L) 04/16/2019 0155    GFRNONAA 20 (L) 10/08/2018 1146   GFRAA 9 (L) 04/16/2019 0155   GFRAA 24 (L) 10/08/2018 1146   Lipase     Component Value Date/Time   LIPASE 27 04/15/2019 1035       Studies/Results: DG Abd Portable 1V  Result Date: 04/15/2019 CLINICAL DATA:  Abdominal distention EXAM: PORTABLE ABDOMEN - 1 VIEW COMPARISON:  04/13/2019 FINDINGS: NG tube remains in the stomach. Mild gaseous distention of the right colon and transverse colon, similar to prior study. No organomegaly or free air. IMPRESSION: Continued mild gaseous distention of the right colon and transverse colon, favor ileus. NG tube in the stomach. Electronically Signed   By: Rolm Baptise M.D.   On: 04/15/2019 08:55   ECHOCARDIOGRAM COMPLETE  Result Date: 04/15/2019    ECHOCARDIOGRAM REPORT   Patient Name:   [name] Date of Exam: 04/15/2019 Medical Rec #:  [id number]        Height:       60.0 in Accession #:    [id number]       Weight:       212.5 lb Date of Birth:  [birth year]        BSA:          1.92 m Patient Age:  85 years         BP:           121/58 mmHg Patient Gender: F                HR:           111 bpm. Exam Location:  Inpatient Procedure: 2D Echo, Cardiac Doppler, Color Doppler and Intracardiac            Opacification Agent Indications:    R60.0 Extremity edema  History:        Patient has prior history of Echocardiogram examinations, most                 recent 04/15/2018. Abnormal ECG, Signs/Symptoms:Altered Mental                 Status and Fever; Risk Factors:Hypertension, Diabetes and                 Dyslipidemia. Cancer. Hypoxia. Pneumonia. Renal failure. Breast                 cancer.  Sonographer:    Roseanna Rainbow RDCS Referring Phys: [id number] [facility]  Sonographer Comments: Technically challenging study due to limited acoustic windows, Technically difficult study due to poor echo windows, no parasternal window, suboptimal apical window, suboptimal subcostal window and patient is morbidly obese. Image acquisition  challenging due to patient body habitus and Image acquisition challenging due to respiratory motion. Patient in chair. Extremely difficult position for exam. Patient had skin lesions in parasternal region. IMPRESSIONS  1. Left ventricular ejection fraction, by estimation, is 70 to 75%. The left ventricle has hyperdynamic function. LV endocardial border not optimally defined to evaluate regional wall motion. Left ventricular diastolic parameters are indeterminate.  2. Right ventricular systolic function was not well visualized. The right ventricular size is normal.  3. The mitral valve was not well visualized. No evidence of mitral valve regurgitation. No evidence of mitral stenosis.  4. The aortic valve was not well visualized. Aortic valve regurgitation is not visualized. No aortic stenosis is present.  5. The inferior vena cava is normal in size with greater than 50% respiratory variability, suggesting right atrial pressure of 3 mmHg. FINDINGS  Left Ventricle: Left ventricular ejection fraction, by estimation, is 70 to 75%. The left ventricle has hyperdynamic function. LV endocardial border not optimally defined to evaluate regional wall motion. Definity contrast agent was given IV to delineate the left ventricular endocardial borders. The left ventricular internal cavity size was normal in size. There is no left ventricular hypertrophy. Left ventricular diastolic parameters are indeterminate. Right Ventricle: The right ventricular size is normal. No increase in right ventricular wall thickness. Right ventricular systolic function was not well visualized. Left Atrium: Left atrial size was not well visualized. Right Atrium: Right atrial size was not well visualized. Pericardium: There is no evidence of pericardial effusion. Mitral Valve: The mitral valve was not well visualized. Normal mobility of the mitral valve leaflets. No evidence of mitral valve regurgitation. No evidence of mitral valve stenosis. Tricuspid  Valve: The tricuspid valve is not well visualized. Tricuspid valve regurgitation is not demonstrated. No evidence of tricuspid stenosis. Aortic Valve: The aortic valve was not well visualized. Aortic valve regurgitation is not visualized. No aortic stenosis is present. Pulmonic Valve: The pulmonic valve was not well visualized. Pulmonic valve regurgitation is not visualized. No evidence of pulmonic stenosis. Aorta: The aortic root is normal in size and structure. Venous: The inferior vena  cava is normal in size with greater than 50% respiratory variability, suggesting right atrial pressure of 3 mmHg. IAS/Shunts: No atrial level shunt detected by color flow Doppler.   LV Volumes (MOD) LV vol d, MOD A4C: 60.9 ml Diastology LV vol s, MOD A4C: 32.7 ml LV e' lateral:   5.98 cm/s LV SV MOD A4C:     60.9 ml LV E/e' lateral: 7.9                            LV e' medial:    3.26 cm/s                            LV E/e' medial:  14.4  RIGHT VENTRICLE             IVC RV S prime:     19.60 cm/s  IVC diam: 1.16 cm TAPSE (M-mode): 2.0 cm LEFT ATRIUM           Index LA Vol (A4C): 15.4 ml 8.04 ml/m  AORTIC VALVE LVOT Vmax:   96.00 cm/s LVOT Vmean:  72.900 cm/s LVOT VTI:    0.123 m MITRAL VALVE MV Area (PHT): 4.89 cm    SHUNTS MV Decel Time: 155 msec    Systemic VTI: 0.12 m MV E velocity: 47.10 cm/s MV A velocity: 59.70 cm/s MV E/A ratio:  0.79 Candee Furbish MD Electronically signed by Candee Furbish MD Signature Date/Time: 04/15/2019/3:54:24 PM    Final     Anti-infectives: Anti-infectives (From admission, onward)   Start     Dose/Rate Route Frequency Ordered Stop   04/16/19 0400  acyclovir (ZOVIRAX) 400 mg in dextrose 5 % 100 mL IVPB     400 mg 108 mL/hr over 60 Minutes Intravenous Every 24 hours 04/15/19 1121     04/15/19 1500  fluconazole (DIFLUCAN) IVPB 50 mg     50 mg 25 mL/hr over 60 Minutes Intravenous Every 24 hours 04/15/19 0654     04/15/19 1400  piperacillin-tazobactam (ZOSYN) IVPB 2.25 g     2.25 g 100 mL/hr  over 30 Minutes Intravenous Every 8 hours 04/15/19 0654     04/14/19 1600  acyclovir (ZOVIRAX) 400 mg in dextrose 5 % 100 mL IVPB  Status:  Discontinued     400 mg 108 mL/hr over 60 Minutes Intravenous Every 12 hours 04/14/19 1409 04/15/19 1121   04/14/19 1500  fluconazole (DIFLUCAN) IVPB 100 mg  Status:  Discontinued     100 mg 50 mL/hr over 60 Minutes Intravenous Every 24 hours 04/14/19 1409 04/15/19 0654   04/13/19 1200  piperacillin-tazobactam (ZOSYN) IVPB 2.25 g  Status:  Discontinued     2.25 g 100 mL/hr over 30 Minutes Intravenous Every 6 hours 04/13/19 0951 04/15/19 0654   04/10/19 1200  ciprofloxacin (CIPRO) IVPB 400 mg  Status:  Discontinued     400 mg 200 mL/hr over 60 Minutes Intravenous Every 24 hours 04/26/2019 1530 04/13/19 0931   04/10/19 0000  ciprofloxacin (CIPRO) IVPB 400 mg  Status:  Discontinued     400 mg 200 mL/hr over 60 Minutes Intravenous Every 12 hours 04/22/2019 1523 04/15/2019 1530   04/21/2019 2200  metroNIDAZOLE (FLAGYL) IVPB 500 mg  Status:  Discontinued     500 mg 100 mL/hr over 60 Minutes Intravenous Every 8 hours 04/12/2019 1523 04/13/19 0931   04/08/2019 2200  valACYclovir (VALTREX) tablet 500 mg  Status:  Discontinued  500 mg Oral 2 times daily 04/12/2019 1740 04/14/19 1409   04/15/2019 1830  fluconazole (DIFLUCAN) tablet 200 mg  Status:  Discontinued     200 mg Oral Daily 04/05/2019 1740 04/14/19 1409   03/31/2019 1115  ciprofloxacin (CIPRO) IVPB 400 mg     400 mg 200 mL/hr over 60 Minutes Intravenous  Once 04/16/2019 1101 04/10/2019 1339   04/07/2019 1115  metroNIDAZOLE (FLAGYL) IVPB 500 mg     500 mg 100 mL/hr over 60 Minutes Intravenous  Once 04/08/2019 1101 04/15/2019 1339       Assessment/Plan AKI on CKD IV DM2  HLD HTN GERD Obesity Cirrhosis with portal HTN Recurrent breast cancer Sigmoid diverticulitis: improving  Ileus - No sign of bowel obstruction on CT from 2/14 - Passing some flatus and patient had a BM yesterday. Clamp NG tube and allow sips of  liquids. If she tolerates this without n/v or increased abdominal pain ok to remove NG tube later today.  FEN - IVF, clamp NG tube, sips clears liquids VTE - SCDs, sq heparin Foley - none Follow up - TBD   LOS: 6 days    Wellington Hampshire, [facility] 04/16/2019, 7:32 AM Please see Amion for pager number during day hours 7:00am-4:30pm

## 2019-04-16 NOTE — Progress Notes (Signed)
Patient BP dropped to 78/*38 HR 125, paged DR Antonieta Pert. Set BP to check every 15 mins and stayed at bed side.  NS bolus started per order 1121- orders received to check BP on right wrist per Dr. Antonieta Pert- value sent to MD

## 2019-04-16 NOTE — Progress Notes (Signed)
PHARMACY NOTE -  ANTIBIOTIC RENAL DOSE ADJUSTMENT    Patient has been initiated on fluconazole for mouth ulcer SCr 4.84, estimated CrCl 9.2 ml/min   Plan: decrease fluconazole 50 q24 to 50 q48 for CrCl < 10  Eudelia Bunch, Pharm.D [phone] 04/16/2019 11:14 AM

## 2019-04-16 NOTE — Progress Notes (Signed)
Comfort care noted Will sign off Let me know if I can be of help.  Madelon Lips MD [facility] pgr [phone]

## 2019-04-16 NOTE — Progress Notes (Signed)
Dr Antonieta Pert at bedside and he called Samatha to update her

## 2019-04-16 NOTE — Progress Notes (Signed)
Daily Progress Note   Patient Name: [name]       Date: 04/16/2019 DOB: [birth year]  Age: 86 y.o. MRN#: [id number] Attending Physician: Antonieta Pert, MD Primary Care Physician: Celene Squibb, MD Admit Date: 04/02/2019  Reason for Consultation/Follow-up: Establishing goals of care  Subjective:  patient confused today, asking for ice chips. Restless at times. Seen earlier this am, returned to speak with daughter face to face.   Length of Stay: 6  Current Medications: Scheduled Meds:  . brimonidine  1 drop Both Eyes BID  . budesonide  2 mL Inhalation BID  . Chlorhexidine Gluconate Cloth  6 each Topical Daily  . dorzolamide  1 drop Both Eyes BID  . ipratropium-albuterol  3 mL Nebulization TID  . latanoprost  1 drop Both Eyes QHS  . magic mouthwash w/lidocaine  5 mL Oral QID  . mouth rinse  15 mL Mouth Rinse BID  . mirabegron ER  50 mg Oral Daily  . mirtazapine  15 mg Oral QHS  . pantoprazole (PROTONIX) IV  40 mg Intravenous Q24H  . sodium chloride flush  10-40 mL Intracatheter Q12H    Continuous Infusions: . acyclovir Stopped (04/16/19 0531)  . albumin human 12.5 g (04/16/19 1136)  . [START ON 04/17/2019] fluconazole (DIFLUCAN) IV    . piperacillin-tazobactam (ZOSYN)  IV Stopped (04/16/19 0622)    PRN Meds: fentaNYL (SUBLIMAZE) injection, hydrALAZINE, LORazepam, LORazepam, menthol-cetylpyridinium, ondansetron **OR** ondansetron (ZOFRAN) IV, polyvinyl alcohol, promethazine, sodium chloride flush  Physical Exam         Awakens some but is confused Worsening edema generalized S1 S2 Shallow regular work of breathing Abdomen mildly distended Has NGT which is clamped.  Vital Signs: BP (!) 91/44 (BP Location: Left Leg)   Pulse (!) 110   Temp 98 F (36.7 C) (Axillary)    Resp (!) 22   Ht 5' (1.524 m)   Wt 96.4 kg   SpO2 96%   BMI 41.51 kg/m  SpO2: SpO2: 96 % O2 Device: O2 Device: Nasal Cannula O2 Flow Rate: O2 Flow Rate (L/min): 3 L/min  Intake/output summary:   Intake/Output Summary (Last 24 hours) at 04/16/2019 1404 Last data filed at 04/16/2019 1130 Gross per 24 hour  Intake 925.77 ml  Output 1150 ml  Net -224.23 ml   LBM: Last BM Date:  04/15/19 Baseline Weight: Weight: 92.3 kg Most recent weight: Weight: 96.4 kg       Palliative Assessment/Data:      Patient Active Problem List   Diagnosis Date Noted  . Pressure injury of skin 04/14/2019  . Diverticulitis of sigmoid colon 04/10/2019  . Sigmoid diverticulitis 04/25/2019  . Cirrhosis ([place]) 11/25/2018  . Community acquired pneumonia 10/27/2018  . Fever 10/26/2018  . Acute Respiratory failure with Hypoxia ([place]) 10/26/2018  . Acute hypoxemic respiratory failure ([place]) 04/15/2018  . Acute on chronic diastolic CHF (congestive heart failure) ([place]) 04/15/2018  . Malignant neoplasm of overlapping sites of left breast in female, estrogen receptor positive ([place]) 04/03/2018  . Recurrent breast adenocarcinoma, left ([place]) 04/03/2018  . Altered mental status 08/18/2017  . Acute encephalopathy 08/17/2017  . Acute Metabolic Encephalopathy acute 08/17/2017  . Encephalopathy 07/25/2017  . Diabetes mellitus ([place]) 09/02/2015  . Anemia due to multiple mechanisms 02/17/2015  . Chronic LBP 02/17/2015  . Impaired renal function 02/17/2015  . Abducens nerve weakness 02/17/2015  . S/P knee replacement 12/18/2014  . Osteoarthritis of right knee 12/18/2014  . IDA (iron deficiency anemia) 10/29/2013  . Vitamin B12 deficiency anemia 09/09/2013  . Anemia, iron deficiency 09/09/2013  . Lumbago 08/06/2013  . Difficulty in walking(719.7) 08/06/2013  . Osteopenia 03/11/2013  . Malignant neoplasm of lower-inner quadrant of left breast in female, estrogen receptor positive ([place]) 03/10/2013  . Bilateral leg  weakness 09/23/2012  . Acute renal failure ([place]) 07/25/2012  . Dehydration 07/25/2012  . DM (diabetes mellitus), type 2, uncontrolled ([place]) 07/25/2012  . Essential hypertension, benign 07/25/2012  . Generalized weakness 07/25/2012  . Hyperlipidemia   . Chronic kidney disease   . Hypertension   . GERD (gastroesophageal reflux disease)   . CAP (community acquired pneumonia) 04/19/2011  . Nausea and vomiting 04/19/2011  . HTN (hypertension) 04/19/2011  . DM type 2 (diabetes mellitus, type 2) ([place]) 04/19/2011  . Arthritis 04/19/2011    Palliative Care Assessment & Plan   Patient Profile:  86 year old lady who lives at home with her daughter.  Past medical history of breast cancer, recurrent breast cancer.  Patient's active issues in this hospitalization include sigmoid diverticulitis and acute kidney injury.  Has underlying cirrhosis with portal hypertension, stage IV chronic kidney disease diabetes dyslipidemia hypertension and GERD.    In this hospitalization, patient is being followed by general surgery, oncology, nephrology.  She remains admitted to hospital medicine service.  Palliative consultation has been requested for goals of care conversations in light of patient's ongoing volume overload as well as worsening renal function.  Assessment:  AKI on CKD IV DM2  HLD HTN GERD Obesity Cirrhosis with portal HTN Recurrent breast cancer Sigmoid diverticulitis: Recommendations/Plan: Family meeting with patient's daughter at bedside. We reviewed patient's current condition, her current vital signs and blood work.  We talked about comfort measures, d/c therapies which are no longer helping, using therapies which will help the patient be comfortable.  Patient's daughter is wrestling with these decisions, how ever, she is clear about not letting her mother suffer. She states that the patient has told her about her funeral arrangements, casket and other important end of life things.    Daughter wishes for the patient's pastor to be able to come visit, she elects for comfort care.  Routine blood work and finger sticks will be d.c. will add low dose IV Dilaudid PRN and also IV Ativan PRN and monitor.  Ok to continue with ice chips and NGT clamped for  now as comfort measures to avoid nausea/vomiting.  Prognosis hours to some very limited number of days. Daughter planning for next steps, with regards to current visitor policy and impending bad weather/ice storm for 04-17-19. Offered active listening and supportive care. Encouraged self care for her as well.    Goals of Care and Additional Recommendations:  Limitations on Scope of Treatment: Full Comfort Care  Code Status:    Code Status Orders  (From admission, onward)         Start     Ordered   04/08/2019 1525  Do not attempt resuscitation (DNR)  Continuous    Question Answer Comment  In the event of cardiac or respiratory ARREST Do not call a "code blue"   In the event of cardiac or respiratory ARREST Do not perform Intubation, CPR, defibrillation or ACLS   In the event of cardiac or respiratory ARREST Use medication by any route, position, wound care, and other measures to relive pain and suffering. May use oxygen, suction and manual treatment of airway obstruction as needed for comfort.   Comments verified with daughter      04/07/2019 1525        Code Status History    Date Active Date Inactive Code Status Order ID Comments User Context   10/26/2018 1236 10/31/2018 1718 DNR [id number]  Roxan Hockey, MD ED   10/26/2018 1235 10/26/2018 1236 Full Code [id number]  Roxan Hockey, MD ED   10/26/2018 1234 10/26/2018 1235 DNR [id number]  Roxan Hockey, MD ED   04/15/2018 1511 04/23/2018 1727 DNR [id number]  Heath Lark D, DO ED   08/17/2017 2256 08/21/2017 1453 DNR [id number]  Heath Lark D, DO ED   07/25/2017 2323 07/27/2017 1921 DNR [id number]  Oswald Hillock, MD Inpatient   12/18/2014 1814 12/21/2014 1648 Full Code [id number]   Rowan Blase Inpatient   04/19/2011 0240 04/20/2011 1316 Full Code [id number]  Jenna Luo, RN Inpatient   Advance Care Planning Activity    Advance Directive Documentation     Most Recent Value  Type of Advance Directive  Healthcare Power of Attorney  Pre-existing out of facility DNR order (yellow form or pink MOST form)  --  "MOST" Form in Place?  --       Prognosis:   Hours - Days  Discharge Planning:  Anticipated Hospital Death  Care plan was discussed with  Patient's RN, patient and patient's daughter. Also discussed with TRH MD.   Thank you for allowing the Palliative Medicine Team to assist in the care of this patient.   Time In: 1300 Time Out: 1405 Total Time 65 Prolonged Time Billed  yes       Greater than 50%  of this time was spent counseling and coordinating care related to the above assessment and plan.  Loistine Chance, MD  Please contact Palliative Medicine Team phone at [phone] for questions and concerns.

## 2019-04-16 NOTE — Progress Notes (Signed)
Called Pallative Team, Dr Loistine Chance number left in progress note- up date on patients condition. message left with RN- she states she is paging Dr Rowe Pavy now.

## 2019-04-16 NOTE — Consult Note (Signed)
   [facility] Inpatient Consult   04/16/2019  [name] 1933-10-11 [id number]   Patient screened for potential [facility] Care Management services due to unplanned readmission risk score of 40%, extreme.  Per chart review, current disposition plan not set. Palliative consult noted and current therapy continuing. Will follow for progression and disposition plans.  [name], MSN, [facility] Liaison Nurse Mobile Phone [phone]  Toll free office [phone]

## 2019-04-17 DIAGNOSIS — Z7189 Other specified counseling: Secondary | ICD-10-CM

## 2019-04-17 DIAGNOSIS — Z515 Encounter for palliative care: Secondary | ICD-10-CM

## 2019-04-17 MED ORDER — IPRATROPIUM-ALBUTEROL 0.5-2.5 (3) MG/3ML IN SOLN: 3.0000 mL | RESPIRATORY_TRACT | Status: DC | PRN

## 2019-04-17 MED ORDER — FENTANYL CITRATE (PF) 100 MCG/2ML IJ SOLN
25.0000 ug | Freq: Four times a day (QID) | INTRAMUSCULAR | Status: DC
  Administered 2019-04-17 – 2019-04-18 (×4): 25 ug via INTRAVENOUS
  Filled 2019-04-17 (×4): qty 2

## 2019-04-17 NOTE — Progress Notes (Signed)
Daily Progress Note   Patient Name: [name]       Date: 04/17/2019 DOB: 1934/01/18  Age: 86 y.o. MRN#: [id number] Attending Physician: Antonieta Pert, MD Primary Care Physician: Celene Squibb, MD Admit Date: 04/06/2019  Reason for Consultation/Follow-up: Establishing goals of care  Subjective:  patient confused and appears in mild distress, rapid work of breathing noted. No family at bedside.    Length of Stay: 7  Current Medications: Scheduled Meds:  . brimonidine  1 drop Both Eyes BID  . budesonide  2 mL Inhalation BID  . Chlorhexidine Gluconate Cloth  6 each Topical Daily  . dorzolamide  1 drop Both Eyes BID  . fentaNYL (SUBLIMAZE) injection  25 mcg Intravenous Q6H  . latanoprost  1 drop Both Eyes QHS  . magic mouthwash w/lidocaine  5 mL Oral QID  . mouth rinse  15 mL Mouth Rinse BID  . scopolamine  1 patch Transdermal Q72H  . sodium chloride flush  10-40 mL Intracatheter Q12H    Continuous Infusions:   PRN Meds: fentaNYL (SUBLIMAZE) injection, hydrALAZINE, ipratropium-albuterol, LORazepam, LORazepam, menthol-cetylpyridinium, ondansetron **OR** ondansetron (ZOFRAN) IV, polyvinyl alcohol, promethazine, sodium chloride flush  Physical Exam         Awakens some but is confused Worsening edema generalized S1 S2 Shallow regular work of breathing Abdomen mildly distended    Vital Signs: BP (!) 91/44 (BP Location: Left Leg)   Pulse (!) 110   Temp 98 F (36.7 C) (Axillary)   Resp (!) 22   Ht 5' (1.524 m)   Wt 96.4 kg   SpO2 98%   BMI 41.51 kg/m  SpO2: SpO2: 98 % O2 Device: O2 Device: Nasal Cannula O2 Flow Rate: O2 Flow Rate (L/min): 3 L/min  Intake/output summary:   Intake/Output Summary (Last 24 hours) at 04/17/2019 1152 Last data filed at 04/17/2019  0962 Gross per 24 hour  Intake --  Output 150 ml  Net -150 ml   LBM: Last BM Date: 04/15/19 Baseline Weight: Weight: 92.3 kg Most recent weight: Weight: 96.4 kg       Palliative Assessment/Data:    Flowsheet Rows     Most Recent Value  Intake Tab  Referral Department  Hospitalist  Unit at Time of Referral  ICU  Palliative Care Primary Diagnosis  Cancer  Date Notified  04/13/19  Palliative Care Type  New Palliative care  Reason for referral  Clarify Goals of Care  Date of Admission  04/17/2019  Date first seen by Palliative Care  04/15/19  # of days Palliative referral response time  2 Day(s)  # of days IP prior to Palliative referral  4  Clinical Assessment  Psychosocial & Spiritual Assessment  Palliative Care Outcomes      Patient Active Problem List   Diagnosis Date Noted  . Pressure injury of skin 04/14/2019  . Diverticulitis of sigmoid colon 04/10/2019  . Sigmoid diverticulitis 04/13/2019  . Cirrhosis ([place]) 11/25/2018  . Community acquired pneumonia 10/27/2018  . Fever 10/26/2018  . Acute Respiratory failure with Hypoxia ([place]) 10/26/2018  . Acute hypoxemic respiratory failure ([place]) 04/15/2018  . Acute on chronic diastolic CHF (congestive heart failure) ([place]) 04/15/2018  . Malignant neoplasm of overlapping sites of left breast in female, estrogen receptor positive ([place]) 04/03/2018  . Recurrent breast adenocarcinoma, left ([place]) 04/03/2018  . Altered mental status 08/18/2017  . Acute encephalopathy 08/17/2017  . Acute Metabolic Encephalopathy acute 08/17/2017  . Encephalopathy 07/25/2017  . Diabetes mellitus ([place]) 09/02/2015  . Anemia due to multiple mechanisms 02/17/2015  . Chronic LBP 02/17/2015  . Impaired renal function 02/17/2015  . Abducens nerve weakness 02/17/2015  . S/P knee replacement 12/18/2014  . Osteoarthritis of right knee 12/18/2014  . IDA (iron deficiency anemia) 10/29/2013  . Vitamin B12 deficiency anemia 09/09/2013  . Anemia, iron  deficiency 09/09/2013  . Lumbago 08/06/2013  . Difficulty in walking(719.7) 08/06/2013  . Osteopenia 03/11/2013  . Malignant neoplasm of lower-inner quadrant of left breast in female, estrogen receptor positive ([place]) 03/10/2013  . Bilateral leg weakness 09/23/2012  . Acute renal failure ([place]) 07/25/2012  . Dehydration 07/25/2012  . DM (diabetes mellitus), type 2, uncontrolled ([place]) 07/25/2012  . Essential hypertension, benign 07/25/2012  . Generalized weakness 07/25/2012  . Hyperlipidemia   . Chronic kidney disease   . Hypertension   . GERD (gastroesophageal reflux disease)   . CAP (community acquired pneumonia) 04/19/2011  . Nausea and vomiting 04/19/2011  . HTN (hypertension) 04/19/2011  . DM type 2 (diabetes mellitus, type 2) ([place]) 04/19/2011  . Arthritis 04/19/2011    Palliative Care Assessment & Plan   Patient Profile:  86 year old lady who lives at home with her daughter.  Past medical history of breast cancer, recurrent breast cancer.  Patient's active issues in this hospitalization include sigmoid diverticulitis and acute kidney injury.  Has underlying cirrhosis with portal hypertension, stage IV chronic kidney disease diabetes dyslipidemia hypertension and GERD.    In this hospitalization, patient is being followed by general surgery, oncology, nephrology.  She remains admitted to hospital medicine service.  Palliative consultation has been requested for goals of care conversations in light of patient's ongoing volume overload as well as worsening renal function.  Assessment:  AKI on CKD IV DM2  HLD HTN GERD Obesity Cirrhosis with portal HTN Recurrent breast cancer Sigmoid diverticulitis: Recommendations/Plan: Add scheduled IV fentanyl also continue with PRN Continue current comfort measures Anticipate hospital death. Prognosis hours to some very limited number of days.   Goals of Care and Additional Recommendations:  Limitations on Scope of Treatment: Full  Comfort Care  Code Status:    Code Status Orders  (From admission, onward)         Start     Ordered   04/08/2019 1525  Do not attempt resuscitation (DNR)  Continuous    Question Answer Comment  In the event of cardiac or respiratory ARREST Do not call a "code blue"   In the event of cardiac or respiratory ARREST Do not perform Intubation, CPR, defibrillation or ACLS   In the event of cardiac or respiratory ARREST Use medication by any route, position, wound care, and other measures to relive pain and suffering. May use oxygen, suction and manual treatment of airway obstruction as needed for comfort.   Comments verified with daughter      04/05/2019 1525        Code Status History    Date Active Date Inactive Code Status Order ID Comments User Context   10/26/2018 1236 10/31/2018 1718 DNR [id number]  Roxan Hockey, MD ED   10/26/2018 1235 10/26/2018 1236 Full Code [id number]  Roxan Hockey, MD ED   10/26/2018 1234 10/26/2018 1235 DNR [id number]  Roxan Hockey, MD ED   04/15/2018 1511 04/23/2018 1727 DNR [id number]  Heath Lark D, DO ED   08/17/2017 2256 08/21/2017 1453 DNR [id number]  Heath Lark D, DO ED   07/25/2017 2323 07/27/2017 1921 DNR [id number]  Oswald Hillock, MD Inpatient   12/18/2014 1814 12/21/2014 1648 Full Code [id number]  Rowan Blase Inpatient   04/19/2011 0240 04/20/2011 1316 Full Code [id number]  Jenna Luo, RN Inpatient   Advance Care Planning Activity    Advance Directive Documentation     Most Recent Value  Type of Advance Directive  Healthcare Power of Attorney  Pre-existing out of facility DNR order (yellow form or pink MOST form)  --  "MOST" Form in Place?  --       Prognosis:   Hours - Days  Discharge Planning:  Anticipated Hospital Death  Care plan was discussed with  Patient's RN   Thank you for allowing the Palliative Medicine Team to assist in the care of this patient.   Time In: 11 Time Out: 11.25 Total Time 25 Prolonged Time  Billed No       Greater than 50%  of this time was spent counseling and coordinating care related to the above assessment and plan.  Loistine Chance, MD  Please contact Palliative Medicine Team phone at [phone] for questions and concerns.

## 2019-04-17 NOTE — Progress Notes (Signed)
PROGRESS NOTE    [name]  OVZ:[id number] DOB: [birth year] DOA: 04/11/2019 PCP: Celene Squibb, MD   Brief Narrative: 86 YOF with significant history of anemia/osteoarthritis, left breast cancer, cataracts, CKD IV, liver cirrhosis, T2DM on long-term insulin, GERD,hyperlipidemia, hypertension history of multiple falls in the past, nocturia, seasonal allergies, history of facial melanoma with surgical excision in 2012 comes to the ER with multiple episodes of nausea vomiting and diarrhea following her chemotherapy cycle this week without melena hematochezia dysuria but with low-grade fever since Saturday, decreased oral intake for several days. In the ED vitals are stable UA CBC and CMP shows metabolic acidosis/CKD chronic.  LFTs lipase normal COVID-19) Shambley negative, underwent chest x-ray no acute finding, and CT abdomen without contrast showed sigmoid diverticulitis, liver cirrhosis.Patient was placed on IV antibiotic and was admitted on liquid diet and seen by her oncology.C diff negative. 2/14-no more diarrhea, but now having nasuea and also vomited x2, also needed nasal cannula oxygen.NGT placed with improvement in symptoms, blood pressure soft and no urine output given IV fluids Foley placed, had AKI on CKD nephro/surgery was consulted. Moved to SDU. Patient was managed on steroid.  Seen by nephrology, palliative care and general surgery. Diarrhea had improved-but persistent nauseous and vomiting and had no BM- xray with dilated stomach, Transverse colon-suspected ileus-NG tube placed 2/14 and seen by surgery.Repeat CT abdomen pelvis 2/14 shows improved diverticulitis no bowel obstruction.  Patient on Zosyn pharmacy dosing- completed 7 days course.Wbc improved.Patient renal function continued to get worse, poor candidate for dialysis, and patient also would not want it.  After family meeting and palliative care discussion family proceeded to comfort measures. 2/17: Patient was hypotensive  placed in bolus and IV alignment, after palliative meeting transitioned to comfort measures  Subjective:  Very lethargic, mumbling words. Daughter was here till night and left this morning.  Assessment & Plan:  Debility/multiple comorbidities/palliative care: Palliative care on board patient is transitioned to comfort measures 2/17 by palliative care.Patient is poor candidate for dialysis and renal failure is worsening. Patient and daughter desires palliative care and to keep her comfrotable.  Continue on current comfort measures with benzo opiates.  Nausea vomiting diarrhea with Sigmoid diverticulitis, also contributed by chemotherapy/presumed ilues: Off NG tube.  Continue pleasure feeding/comfort measures  AKI on CKD stage IV/left renal atrophy/metabolic acidosis: Suspect multifactorial with nausea vomiting/diverticulitis  And with ileus and also third spacing with liver cirrhosis. Baseline creatinine 2.1-2.5. s/p ivf and bicarb due to low urine output on 2/14-also had Foley placed and renal ultrasound obtained,nephro was consulted  s/p lasix.  Renal function continued to get worse creat 2.9-->3.37->4.2->4.8, bun increasing and urine minimal.  Seen by nephrology.  Echo with normal EF.   At this time on comfort measures currently.  Acute hypoxic respiratory failure: Suspect due to abd distension ileus, some fluid overload.  proBNP 900 but (ECHO 04/15/2018 shows lvef of 55-60%),has CKD. Cont lasix per surger, lungs w/ crackles- suspect atelectasis, encourage IS. Patient weight gain noted from 203->221,( wt was checked 1 wk  PTA was at 203 lb for pcp too per daughter, had significant wt gain and was treated w lasix on recent hospital admission and on torsemide 20 mg daily) wt down to 213->212 lb->212. Continue supplemental oxygen and wean as tolerated.   Hypotension- multifactorial- with cirrhosis/third spacing/ NPO-Ileus- given ivf bolus/albumin 2/17  Dehydration/hypovolemia/hyponatremia in the  setting of poor oral intake with nausea/vomiting - received ivf ns and bolus  Lactic acidosis: Likely multifactorial in setting  of nausea vomiting ileus, CKD and also poor clearance due to her liver cirrhosis. trended up to 3.8 and was trending down on repeat. Currently afebrile hemodynamically stable.  CT abdomen without abscess and shows improved diverticulitis.  Recurrent breast adenocarcinoma, left: Cepacetabine on hold. Seen by oncology.Discussed with oncologist - plan is for outpatient follow-up.  Cirrhosis with portal hypertension with ascites/third spacing and fluid overload: Bilirubin abnormal: noted to have Anasarca with ascites small bilateral pleural effusion edema along the flank and hips and subcutaneous tissue/third spacing seen-in the CT scan abdomen: suspect multifactorial due to hypoalbuminemia/liver cirrhosis CKD.  Hypokalemia/hyperkalemia- k stable  DM type 2 on insulin,uncontrolled A1c 11.6 04/20/2019,uncontrolled hyperglycemia: Hold Lantus insulin and glipizide.    Hyperlipidemia:holding statin.  Mouth ulcer likely 2/2 chemo.Was placed on Valtrex/diflucan and magic mouthwash per onco.  Mouth sore has improved.stopped valtrex/diflucan  GERD: stable  Morbid obesity with Body mass index is 41.51 kg/m.  With hypoalbuminemia augment nutrition once able to  Rashes:recently seen by dermatology.  Multilevel chronic back compressions and multilevel bony foraminal impingement, status post operative findings sen in CT.   DVT prophylaxis:  Stop SCD/ Heparin for comfort. Family Communication: Daughter has been updated regarding plan of care.  No family at bedside this morning.  Code Status: she is DNR, Disposition Plan: Patient is from: Home Anticipated Disposition: TBD Barriers to discharge or conditions that needs to be met prior to discharge: remains inhosue for management of complex comorbidities, palliative care plan  Consultants: Oncology, surgery, Nephro, palliative  care. Procedures:see note  ECHO 2/172020 The left ventricle has normal systolic function, with an ejection  fraction of 55-60%. The cavity size was normal. There is moderately  increased left ventricular wall thickness. Left ventricular diastolic  parameters were normal.  Microbiology:see note   Medications: Scheduled Meds: . brimonidine  1 drop Both Eyes BID  . budesonide  2 mL Inhalation BID  . Chlorhexidine Gluconate Cloth  6 each Topical Daily  . dorzolamide  1 drop Both Eyes BID  . latanoprost  1 drop Both Eyes QHS  . magic mouthwash w/lidocaine  5 mL Oral QID  . mouth rinse  15 mL Mouth Rinse BID  . scopolamine  1 patch Transdermal Q72H  . sodium chloride flush  10-40 mL Intracatheter Q12H   Continuous Infusions:   Antimicrobials: Anti-infectives (From admission, onward)   Start     Dose/Rate Route Frequency Ordered Stop   04/17/19 1600  fluconazole (DIFLUCAN) IVPB 50 mg  Status:  Discontinued     50 mg 25 mL/hr over 60 Minutes Intravenous Every 48 hours 04/16/19 1112 04/16/19 1452   04/16/19 0400  acyclovir (ZOVIRAX) 400 mg in dextrose 5 % 100 mL IVPB  Status:  Discontinued     400 mg 108 mL/hr over 60 Minutes Intravenous Every 24 hours 04/15/19 1121 04/16/19 1452   04/15/19 1500  fluconazole (DIFLUCAN) IVPB 50 mg  Status:  Discontinued     50 mg 25 mL/hr over 60 Minutes Intravenous Every 24 hours 04/15/19 0654 04/16/19 1112   04/15/19 1400  piperacillin-tazobactam (ZOSYN) IVPB 2.25 g  Status:  Discontinued     2.25 g 100 mL/hr over 30 Minutes Intravenous Every 8 hours 04/15/19 0654 04/16/19 2236   04/14/19 1600  acyclovir (ZOVIRAX) 400 mg in dextrose 5 % 100 mL IVPB  Status:  Discontinued     400 mg 108 mL/hr over 60 Minutes Intravenous Every 12 hours 04/14/19 1409 04/15/19 1121   04/14/19 1500  fluconazole (DIFLUCAN) IVPB 100  mg  Status:  Discontinued     100 mg 50 mL/hr over 60 Minutes Intravenous Every 24 hours 04/14/19 1409 04/15/19 0654   04/13/19 1200   piperacillin-tazobactam (ZOSYN) IVPB 2.25 g  Status:  Discontinued     2.25 g 100 mL/hr over 30 Minutes Intravenous Every 6 hours 04/13/19 0951 04/15/19 0654   04/10/19 1200  ciprofloxacin (CIPRO) IVPB 400 mg  Status:  Discontinued     400 mg 200 mL/hr over 60 Minutes Intravenous Every 24 hours 04/24/2019 1530 04/13/19 0931   04/10/19 0000  ciprofloxacin (CIPRO) IVPB 400 mg  Status:  Discontinued     400 mg 200 mL/hr over 60 Minutes Intravenous Every 12 hours 04/06/2019 1523 04/06/2019 1530   04/22/2019 2200  metroNIDAZOLE (FLAGYL) IVPB 500 mg  Status:  Discontinued     500 mg 100 mL/hr over 60 Minutes Intravenous Every 8 hours 04/08/2019 1523 04/13/19 0931   04/13/2019 2200  valACYclovir (VALTREX) tablet 500 mg  Status:  Discontinued     500 mg Oral 2 times daily 04/10/2019 1740 04/14/19 1409   04/24/2019 1830  fluconazole (DIFLUCAN) tablet 200 mg  Status:  Discontinued     200 mg Oral Daily 04/21/2019 1740 04/14/19 1409   04/21/2019 1115  ciprofloxacin (CIPRO) IVPB 400 mg     400 mg 200 mL/hr over 60 Minutes Intravenous  Once 04/19/2019 1101 04/03/2019 1339   04/25/2019 1115  metroNIDAZOLE (FLAGYL) IVPB 500 mg     500 mg 100 mL/hr over 60 Minutes Intravenous  Once 03/31/2019 1101 04/05/2019 1339     Objective: Vitals:   04/16/19 1215 04/16/19 1230 04/16/19 1320 04/16/19 1413  BP: (!) 96/46 (!) 85/50 (!) 91/44   Pulse:   (!) 110   Resp: 16 17 (!) 22   Temp:      TempSrc:      SpO2: 96% 96% 96% 98%  Weight:      Height:        Intake/Output Summary (Last 24 hours) at 04/17/2019 0859 Last data filed at 04/16/2019 1130 Gross per 24 hour  Intake 10 ml  Output 50 ml  Net -40 ml   Filed Weights   04/14/19 0500 04/15/19 0500 04/16/19 0500  Weight: 96.8 kg 96.4 kg 96.4 kg   Weight change:   Body mass index is 41.51 kg/m.  Intake/Output from previous day: 02/17 0701 - 02/18 0700 In: 10 [I.V.:10] Out: 50 [Urine:50] Intake/Output this shift: No intake/output data recorded.  Examination: General  exam: Lethargic mumbling words, on oxygen  HEENT:Oral mucosa dry, dentition normal. Respiratory system: bilaterally diminished breath sounds, calm, no use of accessory muscle Cardiovascular system: S1 & S2 +,No JVD,. Gastrointestinal system: Abdomen soft, obese. Nervous System: Lethargic , moving extremities Extremities: Mild leg and arm edema.  Skin: mild rashes,no icterus. MSK: Normal muscle bulk,tone, power.  Data Reviewed: I have personally reviewed following labs and imaging studies  CBC: Recent Labs  Lab 04/12/19 0439 04/13/19 0536 04/14/19 0544 04/15/19 0536 04/16/19 0155  WBC 10.3 11.1* 13.1* 10.9* 9.7  HGB 13.3 14.4 14.2 13.9 13.4  HCT 39.3 41.8 41.8 40.8 40.8  MCV 112.0* 109.4* 113.3* 114.3* 114.9*  PLT 141* 160 169 151 644   Basic Metabolic Panel: Recent Labs  Lab 04/13/19 0536 04/13/19 1252 04/14/19 0544 04/15/19 0536 04/16/19 0155  NA 134* 132* 138 138 140  K 5.3* 4.4 4.3 4.4 4.5  CL 110 107 110 110 111  CO2 12* 14* 18* 15* 15*  GLUCOSE 213* 214*  139* 168* 126*  BUN 64* 68* 77* 88* 104*  CREATININE 2.64* 2.95* 3.37* 4.25* 4.84*  CALCIUM 8.3* 8.0* 8.3* 8.2* 8.3*  MG  --   --  2.1  --   --    GFR: Estimated Creatinine Clearance: 9.2 mL/min (A) (by C-G formula based on SCr of 4.84 mg/dL (H)). Liver Function Tests: Recent Labs  Lab 04/12/19 0439 04/13/19 0536 04/14/19 0544 04/15/19 0536 04/16/19 0155  AST '18 26 20 '$ 37 40  ALT '9 11 11 13 16  '$ ALKPHOS 58 59 59 58 55  BILITOT 1.3* 1.6* 1.5* 2.0* 1.9*  PROT 4.1* 4.0* 3.8* 3.8* 3.6*  ALBUMIN 2.1* 2.0* 2.0* 1.8* 1.7*   No results for input(s): LIPASE, AMYLASE in the last 168 hours. No results for input(s): AMMONIA in the last 168 hours. Coagulation Profile: No results for input(s): INR, PROTIME in the last 168 hours. Cardiac Enzymes: No results for input(s): CKTOTAL, CKMB, CKMBINDEX, TROPONINI in the last 168 hours. BNP (last 3 results) No results for input(s): PROBNP in the last 8760  hours. HbA1C: No results for input(s): HGBA1C in the last 72 hours. CBG: Recent Labs  Lab 04/15/19 1149 04/15/19 1933 04/15/19 2328 04/16/19 0621 04/16/19 1153  GLUCAP 140* 131* 105* 122* 133*   Lipid Profile: No results for input(s): CHOL, HDL, LDLCALC, TRIG, CHOLHDL, LDLDIRECT in the last 72 hours. Thyroid Function Tests: No results for input(s): TSH, T4TOTAL, FREET4, T3FREE, THYROIDAB in the last 72 hours. Anemia Panel: No results for input(s): VITAMINB12, FOLATE, FERRITIN, TIBC, IRON, RETICCTPCT in the last 72 hours. Sepsis Labs: Recent Labs  Lab 04/13/19 7116 04/13/19 1252 04/13/19 1800  LATICACIDVEN 2.9* 3.8* 3.2*    Recent Results (from the past 240 hour(s))  Respiratory Panel by RT PCR (Flu A&B, Covid) - Nasopharyngeal Swab     Status: None   Collection Time: 04/16/2019 11:02 AM   Specimen: Nasopharyngeal Swab  Result Value Ref Range Status   SARS Coronavirus 2 by RT PCR NEGATIVE NEGATIVE Final    Comment: (NOTE) SARS-CoV-2 target nucleic acids are NOT DETECTED. The SARS-CoV-2 RNA is generally detectable in upper respiratoy specimens during the acute phase of infection. The lowest concentration of SARS-CoV-2 viral copies this assay can detect is 131 copies/mL. A negative result does not preclude SARS-Cov-2 infection and should not be used as the sole basis for treatment or other patient management decisions. A negative result may occur with  improper specimen collection/handling, submission of specimen other than nasopharyngeal swab, presence of viral mutation(s) within the areas targeted by this assay, and inadequate number of viral copies (<131 copies/mL). A negative result must be combined with clinical observations, patient history, and epidemiological information. The expected result is Negative. Fact Sheet for Patients:  PinkCheek.be Fact Sheet for Healthcare Providers:  GravelBags.it This test is  not yet ap proved or cleared by the [place] FDA and  has been authorized for detection and/or diagnosis of SARS-CoV-2 by FDA under an Emergency Use Authorization (EUA). This EUA will remain  in effect (meaning this test can be used) for the duration of the COVID-19 declaration under Section 564(b)(1) of the Act, 21 U.S.C. section 360bbb-3(b)(1), unless the authorization is terminated or revoked sooner.    Influenza A by PCR NEGATIVE NEGATIVE Final   Influenza B by PCR NEGATIVE NEGATIVE Final    Comment: (NOTE) The Xpert Xpress SARS-CoV-2/FLU/RSV assay is intended as an aid in  the diagnosis of influenza from Nasopharyngeal swab specimens and  should not be used as  a sole basis for treatment. Nasal washings and  aspirates are unacceptable for Xpert Xpress SARS-CoV-2/FLU/RSV  testing. Fact Sheet for Patients: PinkCheek.be Fact Sheet for Healthcare Providers: GravelBags.it This test is not yet approved or cleared by the [place] FDA and  has been authorized for detection and/or diagnosis of SARS-CoV-2 by  FDA under an Emergency Use Authorization (EUA). This EUA will remain  in effect (meaning this test can be used) for the duration of the  Covid-19 declaration under Section 564(b)(1) of the Act, 21  U.S.C. section 360bbb-3(b)(1), unless the authorization is  terminated or revoked. Performed at [facility], [place] [address] [place], [place] [address]   MRSA PCR Screening     Status: None   Collection Time: 04/17/2019  6:38 PM   Specimen: Nasopharyngeal  Result Value Ref Range Status   MRSA by PCR NEGATIVE NEGATIVE Final    Comment:        The GeneXpert MRSA Assay (FDA approved for NASAL specimens only), is one component of a comprehensive MRSA colonization surveillance program. It is not intended to diagnose MRSA infection nor to guide or monitor treatment for MRSA infections. Performed at  [facility], [place] [address] [place], [place] [address]   C difficile quick scan w PCR reflex     Status: None   Collection Time: 04/10/19  6:06 PM   Specimen: STOOL  Result Value Ref Range Status   C Diff antigen NEGATIVE NEGATIVE Final   C Diff toxin NEGATIVE NEGATIVE Final   C Diff interpretation No C. difficile detected.  Final    Comment: Performed at [facility], [place] [address] [place], [place] [address]  MRSA PCR Screening     Status: None   Collection Time: 04/13/19 12:18 PM   Specimen: Nasal Mucosa; Nasopharyngeal  Result Value Ref Range Status   MRSA by PCR NEGATIVE NEGATIVE Final    Comment:        The GeneXpert MRSA Assay (FDA approved for NASAL specimens only), is one component of a comprehensive MRSA colonization surveillance program. It is not intended to diagnose MRSA infection nor to guide or monitor treatment for MRSA infections. Performed at [facility], [place] [address] [place], [place] [address]     Radiology Studies: ECHOCARDIOGRAM COMPLETE  Result Date: 04/15/2019    ECHOCARDIOGRAM REPORT   Patient Name:   JAYLINE KILBURG Date of Exam: 04/15/2019 Medical Rec #:  [id number]        Height:       60.0 in Accession #:    [id number]       Weight:       212.5 lb Date of Birth:  1934/02/13        BSA:          1.92 m Patient Age:    86 years         BP:           121/58 mmHg Patient Gender: F                HR:           111 bpm. Exam Location:  Inpatient Procedure: 2D Echo, Cardiac Doppler, Color Doppler and Intracardiac            Opacification Agent Indications:    R60.0 Extremity edema  History:        Patient has prior history of Echocardiogram examinations, most  recent 04/15/2018. Abnormal ECG, Signs/Symptoms:Altered Mental                 Status and Fever; Risk Factors:Hypertension, Diabetes and                 Dyslipidemia. Cancer. Hypoxia. Pneumonia. Renal failure. Breast                  cancer.  Sonographer:    Roseanna Rainbow RDCS Referring Phys: [id number] [facility]  Sonographer Comments: Technically challenging study due to limited acoustic windows, Technically difficult study due to poor echo windows, no parasternal window, suboptimal apical window, suboptimal subcostal window and patient is morbidly obese. Image acquisition challenging due to patient body habitus and Image acquisition challenging due to respiratory motion. Patient in chair. Extremely difficult position for exam. Patient had skin lesions in parasternal region. IMPRESSIONS  1. Left ventricular ejection fraction, by estimation, is 70 to 75%. The left ventricle has hyperdynamic function. LV endocardial border not optimally defined to evaluate regional wall motion. Left ventricular diastolic parameters are indeterminate.  2. Right ventricular systolic function was not well visualized. The right ventricular size is normal.  3. The mitral valve was not well visualized. No evidence of mitral valve regurgitation. No evidence of mitral stenosis.  4. The aortic valve was not well visualized. Aortic valve regurgitation is not visualized. No aortic stenosis is present.  5. The inferior vena cava is normal in size with greater than 50% respiratory variability, suggesting right atrial pressure of 3 mmHg. FINDINGS  Left Ventricle: Left ventricular ejection fraction, by estimation, is 70 to 75%. The left ventricle has hyperdynamic function. LV endocardial border not optimally defined to evaluate regional wall motion. Definity contrast agent was given IV to delineate the left ventricular endocardial borders. The left ventricular internal cavity size was normal in size. There is no left ventricular hypertrophy. Left ventricular diastolic parameters are indeterminate. Right Ventricle: The right ventricular size is normal. No increase in right ventricular wall thickness. Right ventricular systolic function was not well visualized. Left Atrium: Left atrial  size was not well visualized. Right Atrium: Right atrial size was not well visualized. Pericardium: There is no evidence of pericardial effusion. Mitral Valve: The mitral valve was not well visualized. Normal mobility of the mitral valve leaflets. No evidence of mitral valve regurgitation. No evidence of mitral valve stenosis. Tricuspid Valve: The tricuspid valve is not well visualized. Tricuspid valve regurgitation is not demonstrated. No evidence of tricuspid stenosis. Aortic Valve: The aortic valve was not well visualized. Aortic valve regurgitation is not visualized. No aortic stenosis is present. Pulmonic Valve: The pulmonic valve was not well visualized. Pulmonic valve regurgitation is not visualized. No evidence of pulmonic stenosis. Aorta: The aortic root is normal in size and structure. Venous: The inferior vena cava is normal in size with greater than 50% respiratory variability, suggesting right atrial pressure of 3 mmHg. IAS/Shunts: No atrial level shunt detected by color flow Doppler.   LV Volumes (MOD) LV vol d, MOD A4C: 60.9 ml Diastology LV vol s, MOD A4C: 32.7 ml LV e' lateral:   5.98 cm/s LV SV MOD A4C:     60.9 ml LV E/e' lateral: 7.9                            LV e' medial:    3.26 cm/s  LV E/e' medial:  14.4  RIGHT VENTRICLE             IVC RV S prime:     19.60 cm/s  IVC diam: 1.16 cm TAPSE (M-mode): 2.0 cm LEFT ATRIUM           Index LA Vol (A4C): 15.4 ml 8.04 ml/m  AORTIC VALVE LVOT Vmax:   96.00 cm/s LVOT Vmean:  72.900 cm/s LVOT VTI:    0.123 m MITRAL VALVE MV Area (PHT): 4.89 cm    SHUNTS MV Decel Time: 155 msec    Systemic VTI: 0.12 m MV E velocity: 47.10 cm/s MV A velocity: 59.70 cm/s MV E/A ratio:  0.79 Candee Furbish MD Electronically signed by Candee Furbish MD Signature Date/Time: 04/15/2019/3:54:24 PM    Final     LOS: 7 days   Time spent: More than 50% of that time was spent in counseling and/or coordination of care.  Antonieta Pert, MD [facility]  04/17/2019, 8:59 AM

## 2019-04-17 NOTE — Progress Notes (Signed)
Nutrition Brief Note  Nutrition department drawn to patient for  LOS and NPO/CL diet order since admission.   Chart reviewed. Pt has now transitioned to full comfort care.   No nutrition interventions warranted at this time.   Please consult as needed.    Lajuan Lines, RD, LDN Clinical Nutrition [place] [phone] After Hours/Weekend Pager # in [phone]

## 2019-04-28 NOTE — Progress Notes (Signed)
This nurse went to patient's room to assess her , found patient pulseless, Roanna Banning was called in to verify, patient was pronounced dead at 75. Dr Maren Beach at patient's room at [facility]. Notified patient's daughter [name].

## 2019-04-28 NOTE — Care Management Important Message (Signed)
Important Message  Patient Details IM Letter given to Marney Doctor RN Case Manager to present to the Patient Name: RETHEL SEBEK MRN: [id number] Date of Birth: [birth year]   Medicare Important Message Given:  Yes     Kerin Salen 04/22/19, 9:52 AM

## 2019-04-28 NOTE — Death Summary Note (Signed)
DEATH SUMMARY   Patient Details  Name: [name] MRN: [id number] DOB: Jul 21, 1933  Admission/Discharge Information   Admit Date:  04/15/19  Date of Death:  24-Apr-2019  Time of Death:  08:20  Length of Stay: 8  Referring Physician: Celene Squibb, MD   Reason(s) for Hospitalization   Multiple episodes of nausea vomiting and diarrhea following her chemotherapy cycle in the setting of chronic illness and the CT scan in the ER showing sigmoid diverticulitis   Diagnoses  Preliminary cause of death: AKI on CKD  Secondary Diagnoses (including complications and co-morbidities):  AKI on CKD stage IV/left renal atrophy/metabolic acidosis: Debility/multiple comorbidities/palliative care Nausea vomiting diarrhea with Sigmoid diverticulitis, also contributed by chemotherapy/presumed ilues: Acute hypoxic respiratory failure:  Hypotension Dehydration/hypovolemia/hyponatremia in the setting of poor oral intake with nausea/vomiting  Lactic acidosis Recurrent breast adenocarcinoma, left: Cirrhosis with portal hypertension with ascites/third spacing and fluid overload Hypokalemia/hyperkalemia- k stable DM type 2 on insulin,uncontrolled A1c 11.6 2019-04-15 Hyperlipidemia:holding statin. Mouth ulcer GERD Morbid obesity with Body mass index is 41.51 kg/m Rashes:recently seen by dermatology. Multilevel chronic back compressions  Brief Hospital Course (including significant findings, care, treatment, and services provided and events leading to death)  [name] is a 86 y.o. year old female with history of anemia/osteoarthritis, left breast cancer, cataracts, CKD IV, liver cirrhosis, T2DM on long-term insulin, GERD,hyperlipidemia, hypertension history of multiple falls in the past, nocturia, seasonal allergies, history of facial melanoma with surgical excision in 2012 comes to the ER with multiple episodes of nausea vomiting and diarrhea following her chemotherapy cycle this week  without melena hematochezia dysuria but with low-grade fever since Saturday, decreased oral intake for several days. In the ED vitals are stable UA CBC and CMP shows metabolic acidosis/CKD chronic.  LFTs lipase normal COVID-19) Shambley negative, underwent chest x-ray no acute finding, and CT abdomen without contrast showed sigmoid diverticulitis, liver cirrhosis.Patient was placed on IV antibiotic and was admitted on liquid diet and seen by her oncology.C diff negative. 2/14-no more diarrhea, but now having nasuea and also vomited x2, also needed nasal cannula oxygen.NGT placed with improvement in symptoms, blood pressure soft and no urine output given IV fluids Foley placed, had AKI on CKD nephro/surgery was consulted. Moved to SDU. Patient was managed on steroid.  Seen by nephrology, palliative care and general surgery. Diarrhea had improved-but persistent nauseous and vomiting and had no BM- xray with dilated stomach, Transverse colon-suspected ileus-NG tube placed 2/14 and seen by surgery.Repeat CT abdomen pelvis 2/14 shows improved diverticulitis no bowel obstruction.  Patient on Zosyn pharmacy dosing- completed 7 days course.Wbc improved.Patient renal function continued to get worse, poor candidate for dialysis, and patient also would not want it.  After family meeting and palliative care discussion family proceeded to comfort measures. 2/17: Patient was hypotensive placed in bolus and IV alignment, after palliative meeting transitioned to comfort measures Patient is continued on comfort measures.  She peacefully died today morning 04/23/2022 at 08.20 am. I did spoke with patient's daughter over the phone she was appreciative of the care.   Pertinent Labs and Studies  Significant Diagnostic Studies CT ABDOMEN PELVIS WO CONTRAST  Result Date: 04/13/2019 CLINICAL DATA:  Diverticulitis. Nausea and vomiting for 5 days. History of breast cancer. EXAM: CT ABDOMEN AND PELVIS WITHOUT CONTRAST TECHNIQUE:  Multidetector CT imaging of the abdomen and pelvis was performed following the standard protocol without IV contrast. COMPARISON:  04/15/19 FINDINGS: Lower chest: New small bilateral pleural effusions with passive atelectasis. Stable coronary  atherosclerosis. Feeding tube extends through the distal esophagus into the stomach body. Mild atelectasis in the lingula. Hepatobiliary: Hepatic cirrhosis with nodular contour. Gallbladder surgically absent. Pancreas: Unremarkable Spleen: Stable hypodense 1.7 by 1.2 cm anterior splenic lesion on image 23/3, no change from the recent prior exam. Adrenals/Urinary Tract: Foley catheter in the empty urinary bladder. Left renal atrophy. Adrenal glands unremarkable. Stomach/Bowel: Periampullary duodenal diverticulum. Sigmoid colon diverticulosis. Mild residual stranding along the sigmoid colon although improved from prior, compatible with improving diverticulitis. Vascular/Lymphatic: Aortoiliac atherosclerotic vascular disease. Portosystemic collaterals along the retroperitoneum. Reproductive: Uterus absent. Adnexa unremarkable. Other: Moderate ascites, new compared to the prior exam. Increased flank edema along the lateral abdominal wall musculature and hips. Stable low-level edema at the root of the mesentery. Musculoskeletal: Stable fusions from L1 through L5 with posterolateral rod and pedicle screw fixation on the left at L2-L3-L4 and with a separate left rod at L5-S1; and on the right at L2-3 and separately at L4-L5-S1. Chronic compressions at all levels between T12 and L4 inclusive. Posterior decompression of much of the lumbar spine. Osseous foraminal narrowing on the right at T12-L1, L2-3, and L3-4; and on the left at T12-L1, L4-5, and L5-S1. No appreciable lucency around the pedicle screws. No new fracture. IMPRESSION: 1. New moderate ascites. New small bilateral pleural effusions and increased edema along the flanks and hips in the subcutaneous tissues, compatible with  accentuated third spacing of fluid. 2. Improved sigmoid diverticulitis, nearly resolved. 3. Hepatic cirrhosis with portal venous hypertension. 4. Stable hypodense lesion in the anterior spleen. 5. Other imaging findings of potential clinical significance: Coronary atherosclerosis. Left renal atrophy. Periampullary duodenal diverticulum. Stable postoperative findings in the lumbar spine with multilevel chronic compressions and multilevel bony foraminal impingement. Electronically Signed   By: Van Clines M.D.   On: 04/13/2019 17:47   CT ABDOMEN PELVIS WO CONTRAST  Result Date: 04/25/2019 CLINICAL DATA:  Generalized abdominal pain. EXAM: CT ABDOMEN AND PELVIS WITHOUT CONTRAST TECHNIQUE: Multidetector CT imaging of the abdomen and pelvis was performed following the standard protocol without IV contrast due to renal insufficiency. COMPARISON:  August 29th 2020. FINDINGS: Lower chest: No acute abnormality. Hepatobiliary: Status post cholecystectomy. No biliary dilatation is noted. Nodular hepatic contours are noted consistent with hepatic cirrhosis. No focal hepatic abnormality is noted on these unenhanced images. Pancreas: Unremarkable. No pancreatic ductal dilatation or surrounding inflammatory changes. Spleen: Stable 17 mm low density is noted in anterior portion of the spleen. Stable mild splenomegaly is noted. Adrenals/Urinary Tract: Adrenal glands appear normal. Left renal atrophy is noted. No hydronephrosis or renal obstruction is noted. No renal or ureteral calculi are noted. Urinary bladder is unremarkable. Stomach/Bowel: The stomach appears normal. There is no evidence of bowel obstruction. The appendix is not clearly visualized, but no inflammation is noted in the right lower quadrant. However, diverticulosis is noted involving the sigmoid colon with surrounding inflammation consistent with diverticulitis. Vascular/Lymphatic: Atherosclerosis of abdominal aorta is noted without aneurysm formation. No  significant adenopathy is noted. Multiple collateral veins are noted in the retroperitoneal region consistent with portal hypertension. Reproductive: Status post hysterectomy. No adnexal masses. Other: No abdominal wall hernia or abnormality. No abdominopelvic ascites. Musculoskeletal: Extensive postsurgical and degenerative changes are noted in the lumbar spine. No acute abnormality is noted. IMPRESSION: 1. Findings consistent with sigmoid diverticulitis. No abscess is noted. 2. Findings consistent with hepatic cirrhosis with associated portal hypertension and mild splenomegaly. 3. Left renal atrophy is noted. No hydronephrosis or renal obstruction is noted. 4. Aortic atherosclerosis. Aortic  Atherosclerosis (ICD10-I70.0). Electronically Signed   By: Marijo Conception M.D.   On: 04/15/2019 10:42   DG Abd 1 View  Result Date: 04/13/2019 CLINICAL DATA:  Nausea and vomiting for 2 days EXAM: ABDOMEN - 1 VIEW COMPARISON:  04/20/2019 FINDINGS: Scattered gas pattern is noted. The stomach is distended. A paucity of distal colonic gas is seen which may be related to the underlying diverticulitis. No free air is seen. Postsurgical and degenerative changes of lumbar spine are noted. IMPRESSION: No acute abnormality seen. Electronically Signed   By: Inez Catalina M.D.   On: 04/13/2019 09:04   CT CHEST WO CONTRAST  Result Date: 04/10/2019 CLINICAL DATA:  Breast cancer surveillance and staging. EXAM: CT CHEST WITHOUT CONTRAST TECHNIQUE: Multidetector CT imaging of the chest was performed following the standard protocol without IV contrast. COMPARISON:  Chest radiography same day.  CT 10/26/2018 FINDINGS: Cardiovascular: Atherosclerosis of the aorta and its branch vessels as seen previously. Coronary artery calcification. Heart size is normal. No pericardial fluid. Mediastinum/Nodes: No mediastinal or hilar mass or lymphadenopathy. Lungs/Pleura: No sign of pulmonary metastatic disease. Few areas of pulmonary scarring. No  pleural fluid. Upper Abdomen: Chronic cirrhosis of the liver. No acute abdominal finding. Very small amount of fluid adjacent to the liver edge. Musculoskeletal: Left breast skin thickening with internal breast density, nonspecific by CT and fairly similar to the previous study. No sign of osseous metastatic disease. Chronic degenerative change of the spine in the lower thoracic and upper lumbar region, subsequent to previous lumbar fusion. No significant change since the previous study. IMPRESSION: No sign of metastatic disease in the chest region. Skin thickening of the left breast with internal left breast density, nonspecific by CT. Aortic atherosclerosis.  Coronary artery calcification. Chronic thoracolumbar degenerative changes without evidence bony metastatic disease or significant change since last year. Electronically Signed   By: Nelson Chimes M.D.   On: 04/07/2019 19:41   US RENAL  Result Date: 04/13/2019 CLINICAL DATA:  Acute kidney injury. History of diabetes, chronic kidney disease, cirrhosis. EXAM: RENAL / URINARY TRACT ULTRASOUND COMPLETE COMPARISON:  Abdomen CT dated 04/23/2019. FINDINGS: Right Kidney: Renal measurements: 10.2 x 4.8 x 4.8 cm = volume: 123 mL. Echogenic cortex. No mass or hydronephrosis. Left Kidney: Renal measurements: 9 x 4 x 4.3 cm = volume: 83 mL. Echogenic cortex. Atrophic, corresponding to CT appearance. No mass or hydronephrosis. Bladder: Decompressed by Foley catheter. Other: None. IMPRESSION: 1. Both kidneys are echogenic indicating chronic medical renal disease. 2. Slight LEFT kidney is somewhat atrophic, as also described on recent CT abdomen report of 04/14/2019. 3. No acute findings.  No hydronephrosis. 4. Bladder decompressed by Foley catheter. Electronically Signed   By: Franki Cabot M.D.   On: 04/13/2019 14:59   DG CHEST PORT 1 VIEW  Result Date: 04/13/2019 CLINICAL DATA:  Shortness of breath EXAM: PORTABLE CHEST 1 VIEW COMPARISON:  04/10/2019 FINDINGS:  Cardiac shadow is stable. Aortic calcifications are again seen. The lungs are well aerated bilaterally. No focal infiltrate or sizable effusion is seen. Minimal atelectatic changes are noted in the left base. IMPRESSION: Mild left basilar atelectasis.  No acute abnormality noted. Electronically Signed   By: Inez Catalina M.D.   On: 04/13/2019 09:00   DG Chest Portable 1 View  Result Date: 04/13/2019 CLINICAL DATA:  86 year old with current history of recurrent estrogen receptor positive breast cancer for which the patient is undergoing chemotherapy, presenting with acute onset of myalgias, nausea and diarrhea that began yesterday. EXAM: PORTABLE  CHEST 1 VIEW COMPARISON:  CT chest 10/26/2018. Chest x-rays 10/26/2018 and earlier. FINDINGS: Cardiac silhouette upper normal in size for AP portable technique, unchanged. Thoracic aorta atherosclerotic, unchanged. Prominent central RIGHT pulmonary vessels, unchanged. Bronchiectasis involving the RIGHT LOWER LOBE, unchanged. Lungs clear. No confluent or ground-glass airspace consolidation. Normal pulmonary vascularity. No visible pleural effusions. IMPRESSION: No acute cardiopulmonary disease. Stable bronchiectasis involving the RIGHT LOWER LOBE. Electronically Signed   By: Evangeline Dakin M.D.   On: 04/13/2019 09:44   DG Abd Portable 1V  Result Date: 04/15/2019 CLINICAL DATA:  Abdominal distention EXAM: PORTABLE ABDOMEN - 1 VIEW COMPARISON:  04/13/2019 FINDINGS: NG tube remains in the stomach. Mild gaseous distention of the right colon and transverse colon, similar to prior study. No organomegaly or free air. IMPRESSION: Continued mild gaseous distention of the right colon and transverse colon, favor ileus. NG tube in the stomach. Electronically Signed   By: Rolm Baptise M.D.   On: 04/15/2019 08:55   DG Abd Portable 1V  Result Date: 04/13/2019 CLINICAL DATA:  Check gastric catheter placement EXAM: PORTABLE ABDOMEN - 1 VIEW COMPARISON:  04/13/2019 FINDINGS:  Gastric catheter is now been placed in the distal stomach. The stomach is decompressed. There remains some gaseous dilation of the transverse colon. No other focal abnormality is noted. IMPRESSION: Gastric catheter within the stomach. Electronically Signed   By: Inez Catalina M.D.   On: 04/13/2019 09:50   ECHOCARDIOGRAM COMPLETE  Result Date: 04/15/2019    ECHOCARDIOGRAM REPORT   Patient Name:   [name] Date of Exam: 04/15/2019 Medical Rec #:  [id number]        Height:       60.0 in Accession #:    [id number]       Weight:       212.5 lb Date of Birth:  February 19, 1934        BSA:          1.92 m Patient Age:    55 years         BP:           121/58 mmHg Patient Gender: F                HR:           111 bpm. Exam Location:  Inpatient Procedure: 2D Echo, Cardiac Doppler, Color Doppler and Intracardiac            Opacification Agent Indications:    R60.0 Extremity edema  History:        Patient has prior history of Echocardiogram examinations, most                 recent 04/15/2018. Abnormal ECG, Signs/Symptoms:Altered Mental                 Status and Fever; Risk Factors:Hypertension, Diabetes and                 Dyslipidemia. Cancer. Hypoxia. Pneumonia. Renal failure. Breast                 cancer.  Sonographer:    Roseanna Rainbow RDCS Referring Phys: [id number] [facility]  Sonographer Comments: Technically challenging study due to limited acoustic windows, Technically difficult study due to poor echo windows, no parasternal window, suboptimal apical window, suboptimal subcostal window and patient is morbidly obese. Image acquisition challenging due to patient body habitus and Image acquisition challenging due to respiratory motion. Patient in chair. Extremely difficult position for exam. Patient  had skin lesions in parasternal region. IMPRESSIONS  1. Left ventricular ejection fraction, by estimation, is 70 to 75%. The left ventricle has hyperdynamic function. LV endocardial border not optimally defined to evaluate  regional wall motion. Left ventricular diastolic parameters are indeterminate.  2. Right ventricular systolic function was not well visualized. The right ventricular size is normal.  3. The mitral valve was not well visualized. No evidence of mitral valve regurgitation. No evidence of mitral stenosis.  4. The aortic valve was not well visualized. Aortic valve regurgitation is not visualized. No aortic stenosis is present.  5. The inferior vena cava is normal in size with greater than 50% respiratory variability, suggesting right atrial pressure of 3 mmHg. FINDINGS  Left Ventricle: Left ventricular ejection fraction, by estimation, is 70 to 75%. The left ventricle has hyperdynamic function. LV endocardial border not optimally defined to evaluate regional wall motion. Definity contrast agent was given IV to delineate the left ventricular endocardial borders. The left ventricular internal cavity size was normal in size. There is no left ventricular hypertrophy. Left ventricular diastolic parameters are indeterminate. Right Ventricle: The right ventricular size is normal. No increase in right ventricular wall thickness. Right ventricular systolic function was not well visualized. Left Atrium: Left atrial size was not well visualized. Right Atrium: Right atrial size was not well visualized. Pericardium: There is no evidence of pericardial effusion. Mitral Valve: The mitral valve was not well visualized. Normal mobility of the mitral valve leaflets. No evidence of mitral valve regurgitation. No evidence of mitral valve stenosis. Tricuspid Valve: The tricuspid valve is not well visualized. Tricuspid valve regurgitation is not demonstrated. No evidence of tricuspid stenosis. Aortic Valve: The aortic valve was not well visualized. Aortic valve regurgitation is not visualized. No aortic stenosis is present. Pulmonic Valve: The pulmonic valve was not well visualized. Pulmonic valve regurgitation is not visualized. No evidence  of pulmonic stenosis. Aorta: The aortic root is normal in size and structure. Venous: The inferior vena cava is normal in size with greater than 50% respiratory variability, suggesting right atrial pressure of 3 mmHg. IAS/Shunts: No atrial level shunt detected by color flow Doppler.   LV Volumes (MOD) LV vol d, MOD A4C: 60.9 ml Diastology LV vol s, MOD A4C: 32.7 ml LV e' lateral:   5.98 cm/s LV SV MOD A4C:     60.9 ml LV E/e' lateral: 7.9                            LV e' medial:    3.26 cm/s                            LV E/e' medial:  14.4  RIGHT VENTRICLE             IVC RV S prime:     19.60 cm/s  IVC diam: 1.16 cm TAPSE (M-mode): 2.0 cm LEFT ATRIUM           Index LA Vol (A4C): 15.4 ml 8.04 ml/m  AORTIC VALVE LVOT Vmax:   96.00 cm/s LVOT Vmean:  72.900 cm/s LVOT VTI:    0.123 m MITRAL VALVE MV Area (PHT): 4.89 cm    SHUNTS MV Decel Time: 155 msec    Systemic VTI: 0.12 m MV E velocity: 47.10 cm/s MV A velocity: 59.70 cm/s MV E/A ratio:  0.79 Candee Furbish MD Electronically signed by Candee Furbish MD Signature Date/Time:  04/15/2019/3:54:24 PM    Final    [place] EKG SITE RITE  Result Date: 04/13/2019 If Site Rite image not attached, placement could not be confirmed due to current cardiac rhythm.   Microbiology Recent Results (from the past 240 hour(s))  Respiratory Panel by RT PCR (Flu A&B, Covid) - Nasopharyngeal Swab     Status: None   Collection Time: 04/27/2019 11:02 AM   Specimen: Nasopharyngeal Swab  Result Value Ref Range Status   SARS Coronavirus 2 by RT PCR NEGATIVE NEGATIVE Final    Comment: (NOTE) SARS-CoV-2 target nucleic acids are NOT DETECTED. The SARS-CoV-2 RNA is generally detectable in upper respiratoy specimens during the acute phase of infection. The lowest concentration of SARS-CoV-2 viral copies this assay can detect is 131 copies/mL. A negative result does not preclude SARS-Cov-2 infection and should not be used as the sole basis for treatment or other patient management decisions.  A negative result may occur with  improper specimen collection/handling, submission of specimen other than nasopharyngeal swab, presence of viral mutation(s) within the areas targeted by this assay, and inadequate number of viral copies (<131 copies/mL). A negative result must be combined with clinical observations, patient history, and epidemiological information. The expected result is Negative. Fact Sheet for Patients:  PinkCheek.be Fact Sheet for Healthcare Providers:  GravelBags.it This test is not yet ap proved or cleared by the [place] FDA and  has been authorized for detection and/or diagnosis of SARS-CoV-2 by FDA under an Emergency Use Authorization (EUA). This EUA will remain  in effect (meaning this test can be used) for the duration of the COVID-19 declaration under Section 564(b)(1) of the Act, 21 U.S.C. section 360bbb-3(b)(1), unless the authorization is terminated or revoked sooner.    Influenza A by PCR NEGATIVE NEGATIVE Final   Influenza B by PCR NEGATIVE NEGATIVE Final    Comment: (NOTE) The Xpert Xpress SARS-CoV-2/FLU/RSV assay is intended as an aid in  the diagnosis of influenza from Nasopharyngeal swab specimens and  should not be used as a sole basis for treatment. Nasal washings and  aspirates are unacceptable for Xpert Xpress SARS-CoV-2/FLU/RSV  testing. Fact Sheet for Patients: PinkCheek.be Fact Sheet for Healthcare Providers: GravelBags.it This test is not yet approved or cleared by the [place] FDA and  has been authorized for detection and/or diagnosis of SARS-CoV-2 by  FDA under an Emergency Use Authorization (EUA). This EUA will remain  in effect (meaning this test can be used) for the duration of the  Covid-19 declaration under Section 564(b)(1) of the Act, 21  U.S.C. section 360bbb-3(b)(1), unless the authorization is   terminated or revoked. Performed at [facility], [place] [address], [place], [place] [address]   MRSA PCR Screening     Status: None   Collection Time: 04/06/2019  6:38 PM   Specimen: Nasopharyngeal  Result Value Ref Range Status   MRSA by PCR NEGATIVE NEGATIVE Final    Comment:        The GeneXpert MRSA Assay (FDA approved for NASAL specimens only), is one component of a comprehensive MRSA colonization surveillance program. It is not intended to diagnose MRSA infection nor to guide or monitor treatment for MRSA infections. Performed at [facility], [place] [address], [place], [place] [address]   C difficile quick scan w PCR reflex     Status: None   Collection Time: 04/10/19  6:06 PM   Specimen: STOOL  Result Value Ref Range Status   C Diff antigen NEGATIVE  NEGATIVE Final   C Diff toxin NEGATIVE NEGATIVE Final   C Diff interpretation No C. difficile detected.  Final    Comment: Performed at [facility], [place] [address], [place], [place] [address]  MRSA PCR Screening     Status: None   Collection Time: 04/13/19 12:18 PM   Specimen: Nasal Mucosa; Nasopharyngeal  Result Value Ref Range Status   MRSA by PCR NEGATIVE NEGATIVE Final    Comment:        The GeneXpert MRSA Assay (FDA approved for NASAL specimens only), is one component of a comprehensive MRSA colonization surveillance program. It is not intended to diagnose MRSA infection nor to guide or monitor treatment for MRSA infections. Performed at [facility], [place] [address] [place],  [address]     Lab Basic Metabolic Panel: Recent Labs  Lab 04/13/19 0536 04/13/19 1252 04/14/19 0544 04/15/19 0536 04/16/19 0155  NA 134* 132* 138 138 140  K 5.3* 4.4 4.3 4.4 4.5  CL 110 107 110 110 111  CO2 12* 14* 18* 15* 15*  GLUCOSE 213* 214* 139* 168* 126*  BUN 64* 68* 77* 88* 104*  CREATININE 2.64* 2.95* 3.37* 4.25* 4.84*  CALCIUM 8.3*  8.0* 8.3* 8.2* 8.3*  MG  --   --  2.1  --   --    Liver Function Tests: Recent Labs  Lab 04/12/19 0439 04/13/19 0536 04/14/19 0544 04/15/19 0536 04/16/19 0155  AST 18 26 20  37 40  ALT 9 11 11 13 16   ALKPHOS 58 59 59 58 55  BILITOT 1.3* 1.6* 1.5* 2.0* 1.9*  PROT 4.1* 4.0* 3.8* 3.8* 3.6*  ALBUMIN 2.1* 2.0* 2.0* 1.8* 1.7*   No results for input(s): LIPASE, AMYLASE in the last 168 hours. No results for input(s): AMMONIA in the last 168 hours. CBC: Recent Labs  Lab 04/12/19 0439 04/13/19 0536 04/14/19 0544 04/15/19 0536 04/16/19 0155  WBC 10.3 11.1* 13.1* 10.9* 9.7  HGB 13.3 14.4 14.2 13.9 13.4  HCT 39.3 41.8 41.8 40.8 40.8  MCV 112.0* 109.4* 113.3* 114.3* 114.9*  PLT 141* 160 169 151 152   Cardiac Enzymes: No results for input(s): CKTOTAL, CKMB, CKMBINDEX, TROPONINI in the last 168 hours. Sepsis Labs: Recent Labs  Lab 04/13/19 0536 04/13/19 0822 04/13/19 1252 04/13/19 1800 04/14/19 0544 04/15/19 0536 04/16/19 0155  WBC 11.1*  --   --   --  13.1* 10.9* 9.7  LATICACIDVEN  --  2.9* 3.8* 3.2*  --   --   --     Procedures/Operations    Antonieta Pert 2019/04/19, 8:39 AM

## 2019-04-28 NOTE — Progress Notes (Signed)
[name] died peacefully this AM. I met with the family in her room. They are very appreciative of the care [name] received and especially mentioned their gratitude to Dr Rowe Pavy in Palliative.

## 2019-04-28 DEATH — deceased

## 2019-04-29 ENCOUNTER — Other Ambulatory Visit: Payer: Medicare HMO

## 2019-04-29 ENCOUNTER — Ambulatory Visit: Payer: Medicare HMO | Admitting: Oncology

## 2019-05-07 ENCOUNTER — Telehealth: Payer: Self-pay | Admitting: *Deleted

## 2019-05-07 NOTE — Telephone Encounter (Signed)
Records faxed to Sharen Heck (daughter); release [phone]

## 2019-06-04 ENCOUNTER — Ambulatory Visit: Payer: Medicare HMO | Admitting: Podiatry

## 2020-07-17 IMAGING — CT CT CHEST W/O CM
2 of 3 series · 15 of 36 positions shown, 18 images · non-contrast
Comparison: Today's bone scan, dictated separately.

CLINICAL DATA: Breast cancer. Staging. Recurrent left breast cancer
with apparent spread to skin/chest wall.

EXAM:
CT CHEST WITHOUT CONTRAST
TECHNIQUE: Multidetector CT imaging of the chest was performed following the
standard protocol without IV contrast.

[Series 3: chest w/o 2mm st · axial · non-contrast · 0.98mm/px · z∈[+1212,+1484]mm · 12 of 160 slices shown, 15 images]
[im 12/160  mediastinal]
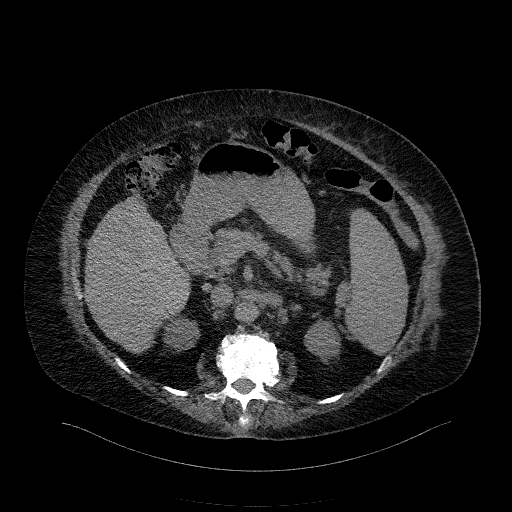
[im 12/160  lung]
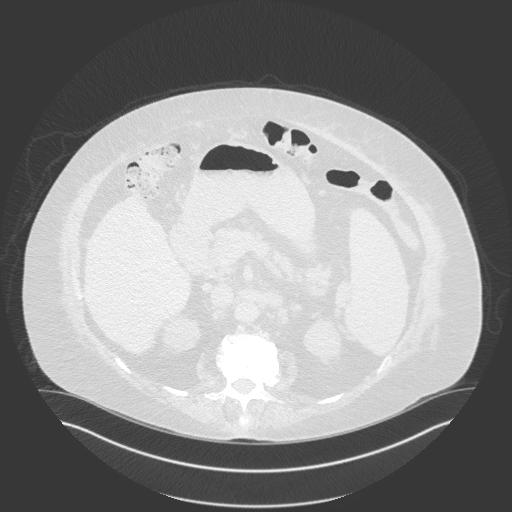
[im 24/160  lung]
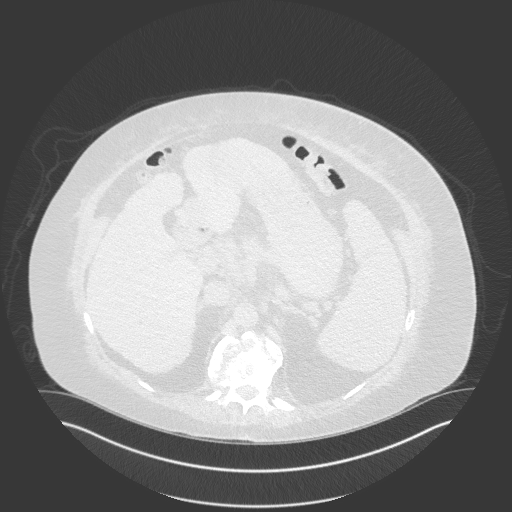
[im 36/160  lung]
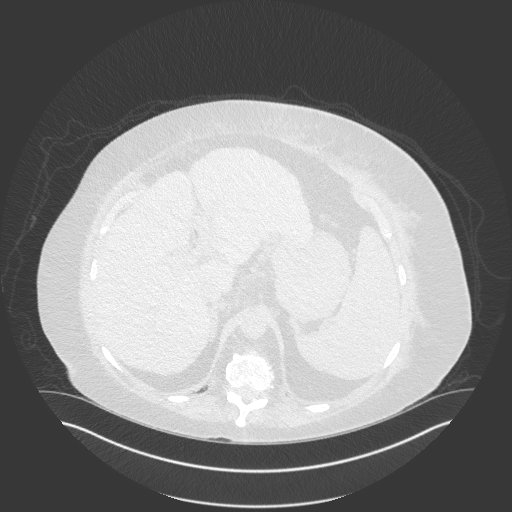
[im 48/160  lung]
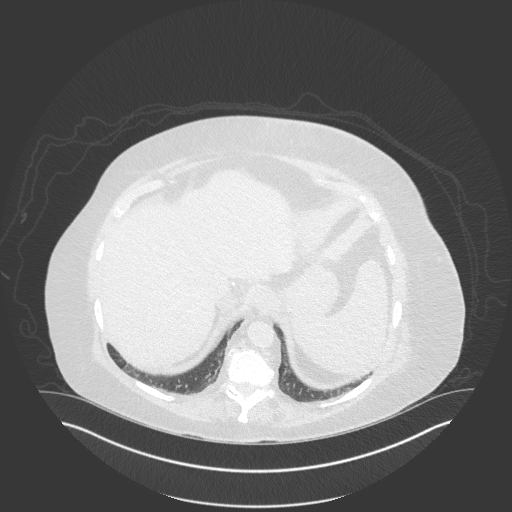
[im 59/160  mediastinal]
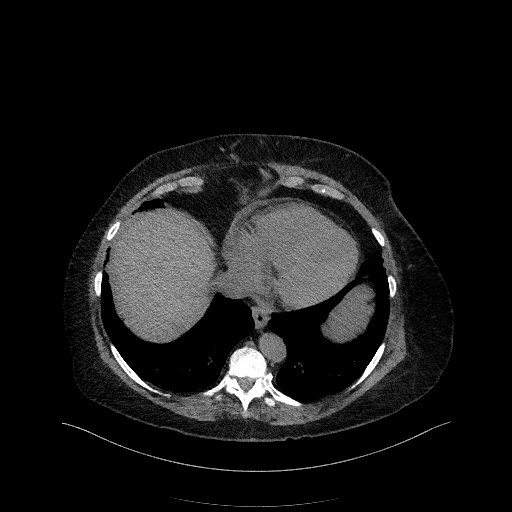
[im 59/160  lung]
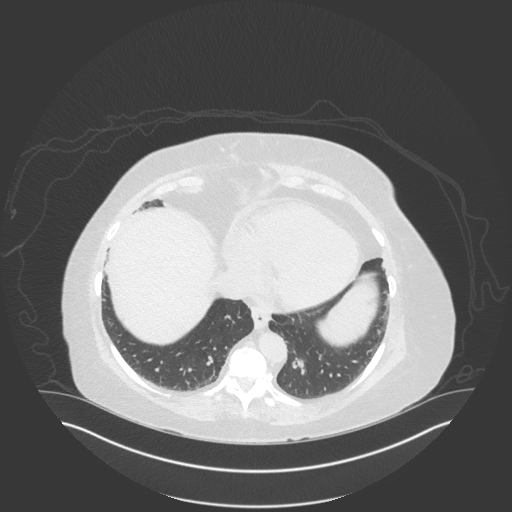
[im 71/160  lung]
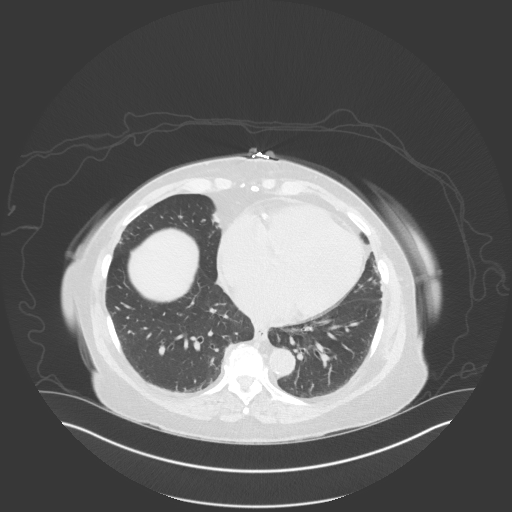
[im 89/160  lung]
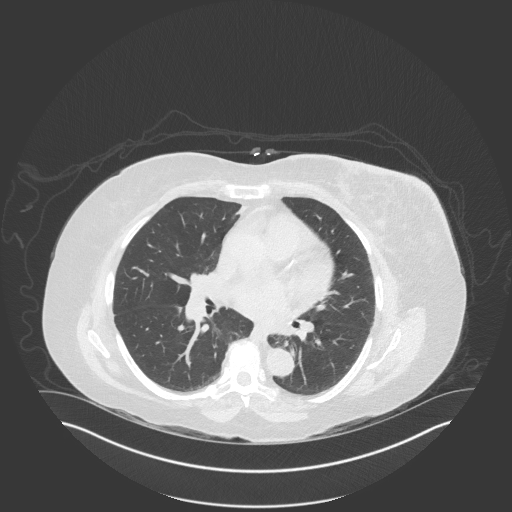
[im 101/160  lung]
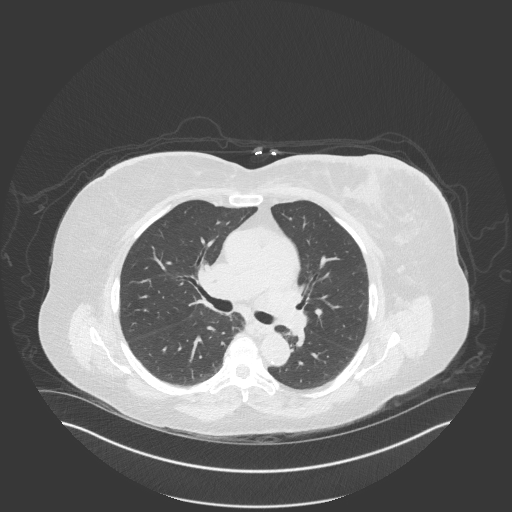
[im 112/160  mediastinal]
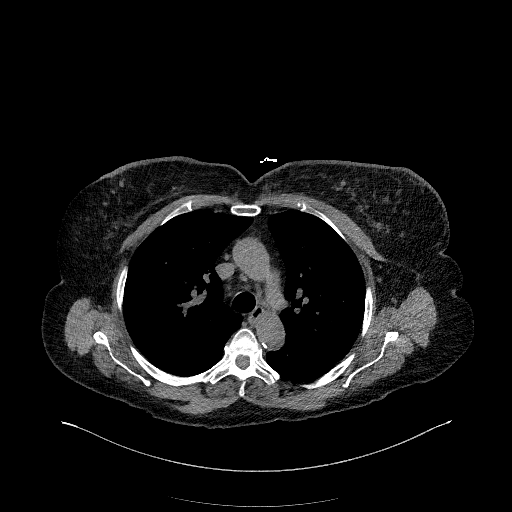
[im 112/160  lung]
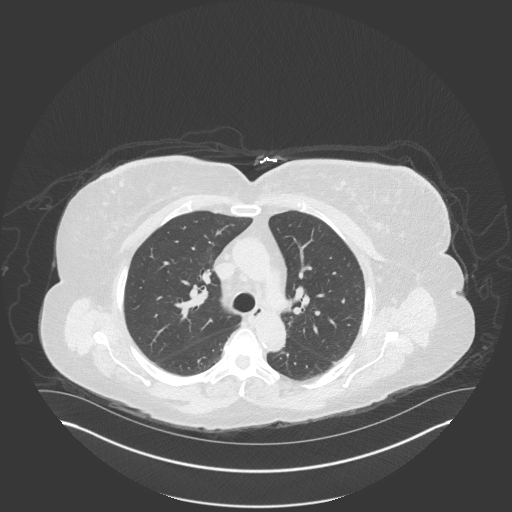
[im 124/160  lung]
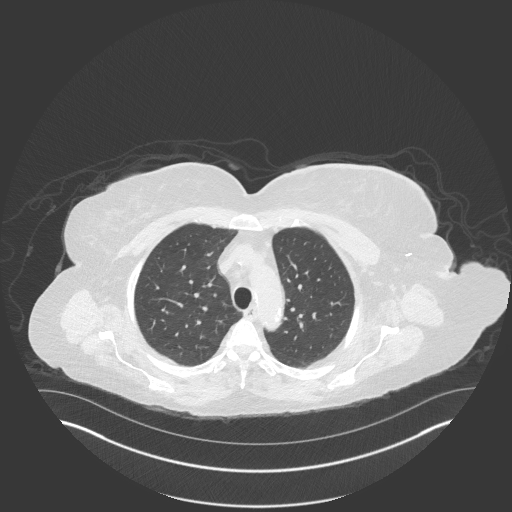
[im 136/160  lung]
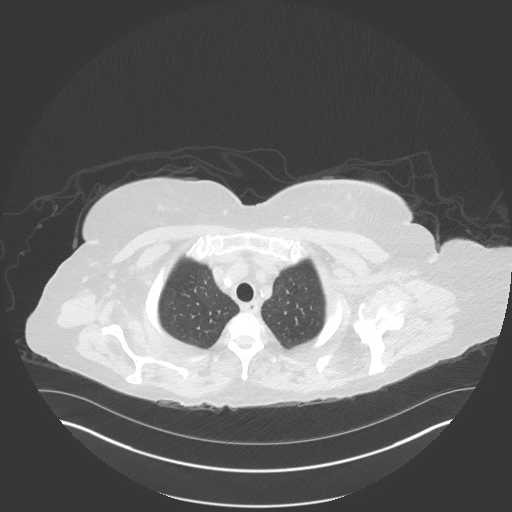
[im 148/160  lung]
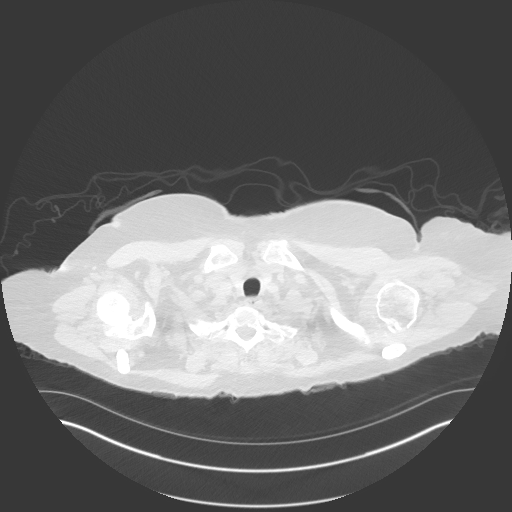

[Series 5: chest w/o 3mm st cor · coronal · non-contrast · 0.63mm/px · 3 of 101 slices shown]
[im 21/101  lung]
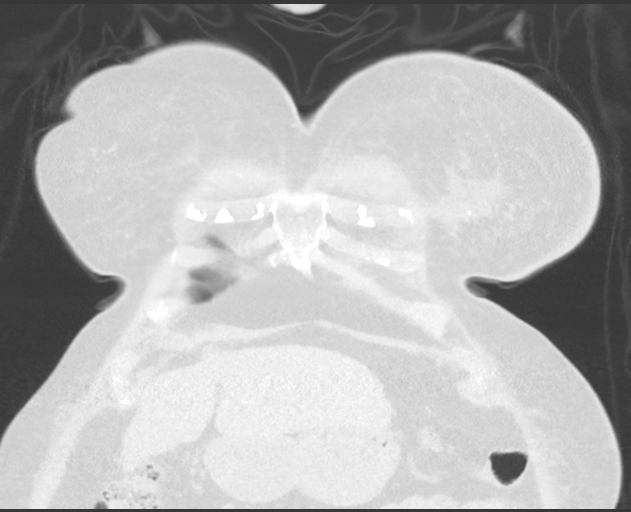
[im 41/101  lung]
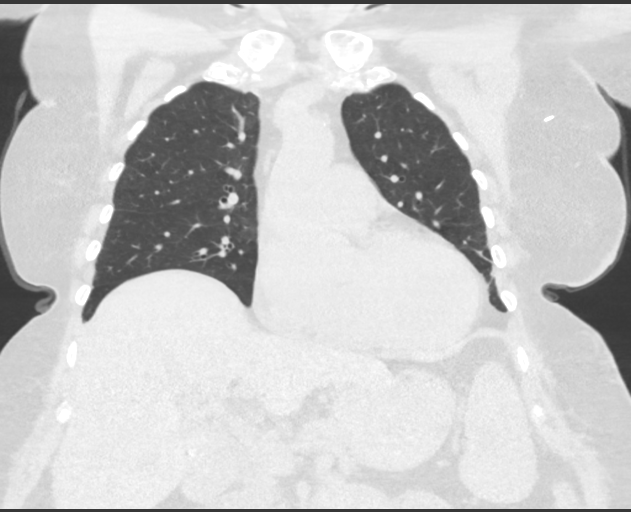
[im 61/101  lung]
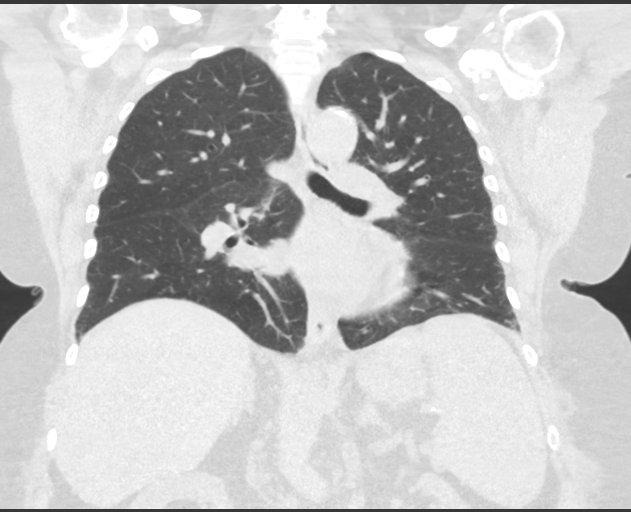

[15 of 36 positions shown; findings below may reference images not displayed]

Chest
radiograph 08/17/2017. Abdominal CT of 04/10/2011. Clinic note of
04/03/2018
FINDINGS: Cardiovascular: Aortic atherosclerosis. Tortuous thoracic aorta.
Moderate cardiomegaly, without pericardial effusion. Multivessel
coronary artery atherosclerosis.

Mediastinum/Nodes: Multiple small left axillary nodes. None are
pathologic by size criteria. A right axillary node is upper normal
sized at 10 mm on image [DATE].

No mediastinal or definite hilar adenopathy, given limitations of
unenhanced CT.

Lungs/Pleura: No pleural fluid. Bibasilar scarring. No suspicious
pulmonary nodule or mass.

Upper Abdomen: Cholecystectomy. Advanced cirrhosis. Splenomegaly,
with a maximal transverse dimension of 16.4 cm. Vague low-density
within the medial spleen of 1.7 cm was present in 5687 and of no
clinical significance.

Normal imaged portions of the stomach, pancreas, adrenal glands,
kidneys. Abdominal aortic atherosclerosis. Multiple small
retroperitoneal nodes, none pathologic by size criteria.

Musculoskeletal: Left breast skin thickening. Retroareolar soft
tissue fullness including on image 66/3.

Left breast nodularity including laterally at 11 mm on image 71/3
and 12 mm on image 81/3.

Mild T12 compression deformity. Surgical changes about the upper
lumbar spine, incompletely imaged.
IMPRESSION: 1. Left breast skin thickening and nodularity, nonspecific in the
setting of prior therapy.
2. Borderline right axillary adenopathy. Multiple left axillary
nodes, none pathologic by size criteria.
3. Cirrhosis and splenomegaly, suboptimally evaluated.
4. Coronary artery atherosclerosis. Aortic Atherosclerosis
([id number]).

## 2020-07-17 IMAGING — NM NM BONE WHOLE BODY
2 series · 2 of 2 positions shown · non-contrast
Comparison: Chest CT 04/11/2018

CLINICAL DATA: Breast cancer .  Evaluate for metastatic disease.

EXAM:
NUCLEAR MEDICINE WHOLE BODY BONE SCAN
TECHNIQUE: Whole body anterior and posterior images were obtained approximately
3 hours after intravenous injection of radiopharmaceutical.
RADIOPHARMACEUTICALS:  20.6 mCi 7echnetium-77m MDP IV

[Series 1: whole body · 2.66mm/px · 1 of 1 slices shown (1 of 2)]
[im 1/1]
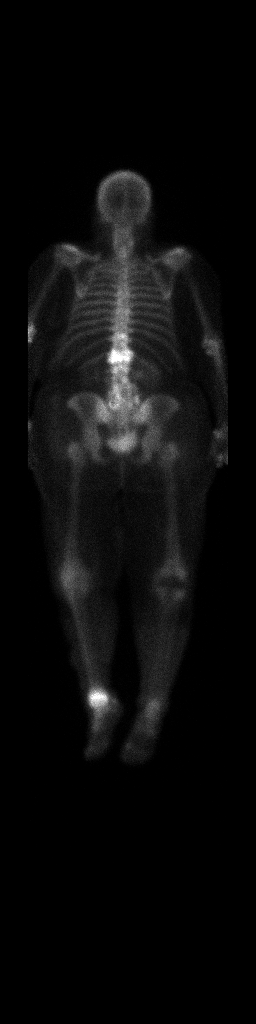

[Series 1: whole body · 2.66mm/px · 1 of 1 slices shown (2 of 2)]
[im 1/1]
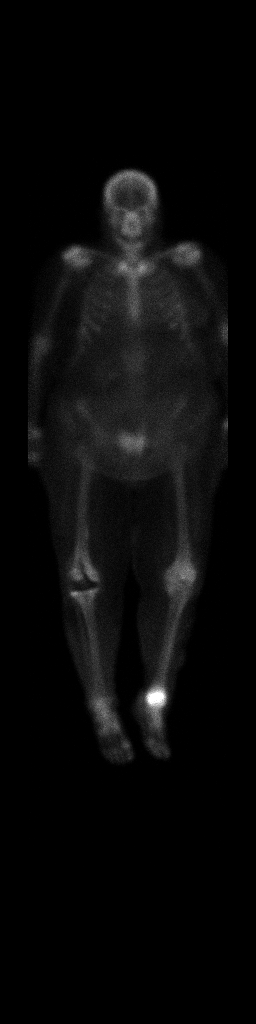

[2 of 2 positions shown; findings below may reference images not displayed]

FINDINGS: Focal intense uptake within a large portion of the the T12 vertebral
body. Small focus of uptake in the RIGHT aspect T11 vertebral body.
Findings respond to severe degenerate change associated with the
posterior thoraco lumbar fusion partially imaged on chest CT
04/11/2018

More mild posterior uptake within the lumbar vertebral bodies is a
degenerative pattern.

Intense uptake in the LEFT ankle.
IMPRESSION: 1. Uptake in the T12 vertebral body related to thoracolumbar fusion
and severe disc osteophytic disease seen on comparison CT.
2. Intense uptake in the LEFT ankle presumably represents trauma.
Recommend radiographs of the LEFT ankle additionally.

## 2021-01-31 IMAGING — CR PORTABLE CHEST - 1 VIEW
1 series · 1 of 1 positions shown · non-contrast
Comparison: 04/15/2018

CLINICAL DATA: Weakness and confusion.

EXAM:
PORTABLE CHEST 1 VIEW

[portable]
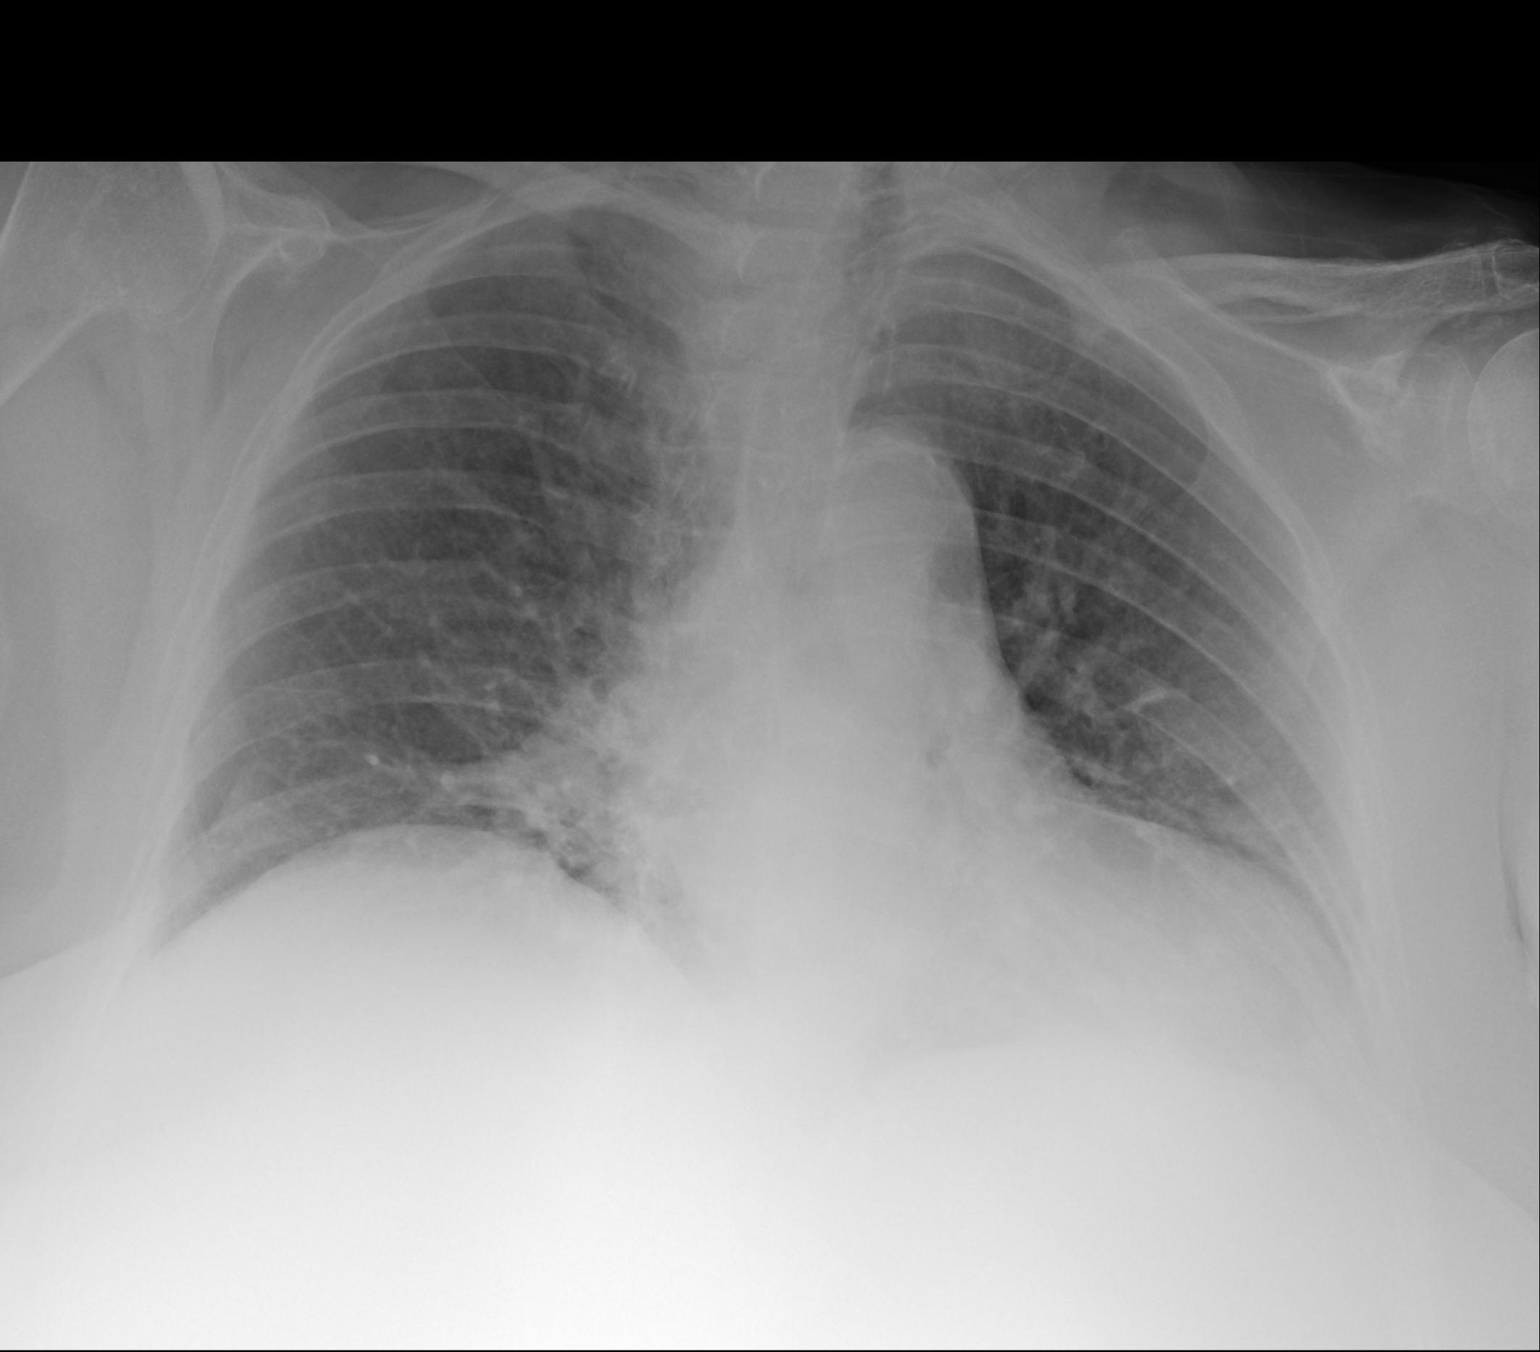

[1 of 1 positions shown; findings below may reference images not displayed]

FINDINGS: Chronic cardiomegaly with left ventricular prominence. Chronic
aortic atherosclerosis. Chronic abnormal lung markings. Slight
increase in patchy density at the lung bases could be atelectasis or
mild basilar pneumonia. No visible effusion.
IMPRESSION: Chronic cardiomegaly and lung markings. Slightly more prominent
markings at the lung bases than seen previously. This could relate
to mild atelectasis or mild basilar pneumonia.
